# Patient Record
Sex: Male | Born: 1975 | Race: White | Hispanic: No | Smoking: Current every day smoker
Health system: Southern US, Community
[De-identification: ages and names within clinical notes are randomized; demographics above are authoritative.]

## PROBLEM LIST (undated history)

## (undated) DIAGNOSIS — J45909 Unspecified asthma, uncomplicated: Secondary | ICD-10-CM

## (undated) DIAGNOSIS — G8929 Other chronic pain: Secondary | ICD-10-CM

## (undated) DIAGNOSIS — J189 Pneumonia, unspecified organism: Secondary | ICD-10-CM

## (undated) DIAGNOSIS — D649 Anemia, unspecified: Secondary | ICD-10-CM

## (undated) DIAGNOSIS — F209 Schizophrenia, unspecified: Secondary | ICD-10-CM

## (undated) DIAGNOSIS — G894 Chronic pain syndrome: Secondary | ICD-10-CM

## (undated) DIAGNOSIS — M549 Dorsalgia, unspecified: Secondary | ICD-10-CM

## (undated) DIAGNOSIS — F319 Bipolar disorder, unspecified: Secondary | ICD-10-CM

## (undated) DIAGNOSIS — N186 End stage renal disease: Secondary | ICD-10-CM

## (undated) DIAGNOSIS — I255 Ischemic cardiomyopathy: Secondary | ICD-10-CM

## (undated) DIAGNOSIS — I10 Essential (primary) hypertension: Secondary | ICD-10-CM

## (undated) DIAGNOSIS — Z720 Tobacco use: Secondary | ICD-10-CM

## (undated) DIAGNOSIS — R7301 Impaired fasting glucose: Secondary | ICD-10-CM

## (undated) DIAGNOSIS — J449 Chronic obstructive pulmonary disease, unspecified: Secondary | ICD-10-CM

## (undated) DIAGNOSIS — R109 Unspecified abdominal pain: Secondary | ICD-10-CM

## (undated) DIAGNOSIS — G43909 Migraine, unspecified, not intractable, without status migrainosus: Secondary | ICD-10-CM

## (undated) DIAGNOSIS — M542 Cervicalgia: Secondary | ICD-10-CM

## (undated) HISTORY — DX: Schizophrenia, unspecified: F20.9

## (undated) HISTORY — DX: Bipolar disorder, unspecified: F31.9

## (undated) HISTORY — DX: Impaired fasting glucose: R73.01

## (undated) HISTORY — DX: Chronic obstructive pulmonary disease, unspecified: J44.9

## (undated) HISTORY — DX: Anemia, unspecified: D64.9

## (undated) HISTORY — DX: Chronic pain syndrome: G89.4

## (undated) HISTORY — PX: CORONARY ANGIOPLASTY WITH STENT PLACEMENT: SHX49

## (undated) HISTORY — DX: Tobacco use: Z72.0

## (undated) HISTORY — DX: Ischemic cardiomyopathy: I25.5

## (undated) HISTORY — PX: AV FISTULA PLACEMENT: SHX1204

---

## 2002-01-18 ENCOUNTER — Encounter: Payer: Self-pay | Admitting: Emergency Medicine

## 2002-01-18 ENCOUNTER — Emergency Department (HOSPITAL_COMMUNITY): Admission: EM | Admit: 2002-01-18 | Discharge: 2002-01-18 | Payer: Self-pay | Admitting: Emergency Medicine

## 2002-04-27 ENCOUNTER — Emergency Department (HOSPITAL_COMMUNITY): Admission: EM | Admit: 2002-04-27 | Discharge: 2002-04-27 | Payer: Self-pay | Admitting: *Deleted

## 2002-11-10 ENCOUNTER — Emergency Department (HOSPITAL_COMMUNITY): Admission: EM | Admit: 2002-11-10 | Discharge: 2002-11-10 | Payer: Self-pay | Admitting: *Deleted

## 2003-01-27 ENCOUNTER — Emergency Department (HOSPITAL_COMMUNITY): Admission: EM | Admit: 2003-01-27 | Discharge: 2003-01-27 | Payer: Self-pay | Admitting: Emergency Medicine

## 2003-03-21 ENCOUNTER — Encounter: Payer: Self-pay | Admitting: *Deleted

## 2003-03-21 ENCOUNTER — Emergency Department (HOSPITAL_COMMUNITY): Admission: EM | Admit: 2003-03-21 | Discharge: 2003-03-21 | Payer: Self-pay | Admitting: *Deleted

## 2003-04-04 ENCOUNTER — Encounter: Payer: Self-pay | Admitting: *Deleted

## 2003-04-04 ENCOUNTER — Emergency Department (HOSPITAL_COMMUNITY): Admission: EM | Admit: 2003-04-04 | Discharge: 2003-04-04 | Payer: Self-pay | Admitting: *Deleted

## 2003-05-06 ENCOUNTER — Encounter: Payer: Self-pay | Admitting: Emergency Medicine

## 2003-05-06 ENCOUNTER — Emergency Department (HOSPITAL_COMMUNITY): Admission: EM | Admit: 2003-05-06 | Discharge: 2003-05-06 | Payer: Self-pay | Admitting: Emergency Medicine

## 2003-05-09 ENCOUNTER — Inpatient Hospital Stay (HOSPITAL_COMMUNITY): Admission: EM | Admit: 2003-05-09 | Discharge: 2003-05-16 | Payer: Self-pay | Admitting: Psychiatry

## 2003-05-14 ENCOUNTER — Emergency Department (HOSPITAL_COMMUNITY): Admission: EM | Admit: 2003-05-14 | Discharge: 2003-05-14 | Payer: Self-pay | Admitting: Emergency Medicine

## 2003-05-28 ENCOUNTER — Encounter: Payer: Self-pay | Admitting: Emergency Medicine

## 2003-05-29 ENCOUNTER — Inpatient Hospital Stay (HOSPITAL_COMMUNITY): Admission: EM | Admit: 2003-05-29 | Discharge: 2003-05-30 | Payer: Self-pay | Admitting: Emergency Medicine

## 2003-07-13 ENCOUNTER — Emergency Department (HOSPITAL_COMMUNITY): Admission: EM | Admit: 2003-07-13 | Discharge: 2003-07-13 | Payer: Self-pay | Admitting: Emergency Medicine

## 2003-08-02 ENCOUNTER — Encounter: Payer: Self-pay | Admitting: Family Medicine

## 2003-08-02 ENCOUNTER — Ambulatory Visit (HOSPITAL_COMMUNITY): Admission: RE | Admit: 2003-08-02 | Discharge: 2003-08-02 | Payer: Self-pay | Admitting: Family Medicine

## 2003-12-12 ENCOUNTER — Emergency Department (HOSPITAL_COMMUNITY): Admission: EM | Admit: 2003-12-12 | Discharge: 2003-12-12 | Payer: Self-pay | Admitting: Emergency Medicine

## 2004-01-16 ENCOUNTER — Inpatient Hospital Stay (HOSPITAL_COMMUNITY): Admission: EM | Admit: 2004-01-16 | Discharge: 2004-01-23 | Payer: Self-pay | Admitting: Psychiatry

## 2004-03-11 ENCOUNTER — Ambulatory Visit (HOSPITAL_COMMUNITY): Admission: RE | Admit: 2004-03-11 | Discharge: 2004-03-11 | Payer: Self-pay | Admitting: Nephrology

## 2004-03-29 ENCOUNTER — Ambulatory Visit (HOSPITAL_COMMUNITY): Admission: RE | Admit: 2004-03-29 | Discharge: 2004-03-29 | Payer: Self-pay | Admitting: Family Medicine

## 2004-10-27 ENCOUNTER — Emergency Department (HOSPITAL_COMMUNITY): Admission: EM | Admit: 2004-10-27 | Discharge: 2004-10-27 | Payer: Self-pay | Admitting: Emergency Medicine

## 2004-11-24 ENCOUNTER — Ambulatory Visit (HOSPITAL_COMMUNITY): Admission: RE | Admit: 2004-11-24 | Discharge: 2004-11-24 | Payer: Self-pay | Admitting: Family Medicine

## 2005-03-29 ENCOUNTER — Emergency Department (HOSPITAL_COMMUNITY): Admission: EM | Admit: 2005-03-29 | Discharge: 2005-03-29 | Payer: Self-pay | Admitting: Emergency Medicine

## 2005-04-03 ENCOUNTER — Emergency Department (HOSPITAL_COMMUNITY): Admission: EM | Admit: 2005-04-03 | Discharge: 2005-04-03 | Payer: Self-pay | Admitting: *Deleted

## 2005-04-14 ENCOUNTER — Ambulatory Visit: Payer: Self-pay | Admitting: Family Medicine

## 2005-07-24 ENCOUNTER — Emergency Department (HOSPITAL_COMMUNITY): Admission: EM | Admit: 2005-07-24 | Discharge: 2005-07-24 | Payer: Self-pay | Admitting: Emergency Medicine

## 2005-09-24 IMAGING — US US RETROPERITONEAL COMPLETE
1 series · 14 of 25 positions shown · non-contrast
Comparison: none

CLINICAL DATA: Renal insufficiency.
 BILATERAL RENAL ULTRASOUND
 No prior studies.
 The right kidney measures 13.9 cm in greatest length and the left kidney measures 11.7 cm in greatest length.  This discrepancy in size is due to a known right sided duplicated collecting system.  The kidneys are essentially isoechoic when compared to the adjacent spleen and liver.  No renal stones, masses, or hydronephrosis. 
 The urinary bladder is collapsed.  
 IMPRESSION
 Cause of the patient?s renal insufficiency is not immediately apparent.  The kidneys are of the same echogenicity as the adjacent liver and spleen, which can occasionally be seen in patient?s chronic medical renal disease, but is more frequently seen in normal patients.  The right kidney is discrepantly larger than left, but this is due to a nonduplicated collecting system.  Even partially duplicated collecting system can predispose the patient to infection on the right.

[Series 1: unknown · 0.28mm/px · 14 of 34 slices shown]
[im 1/34]
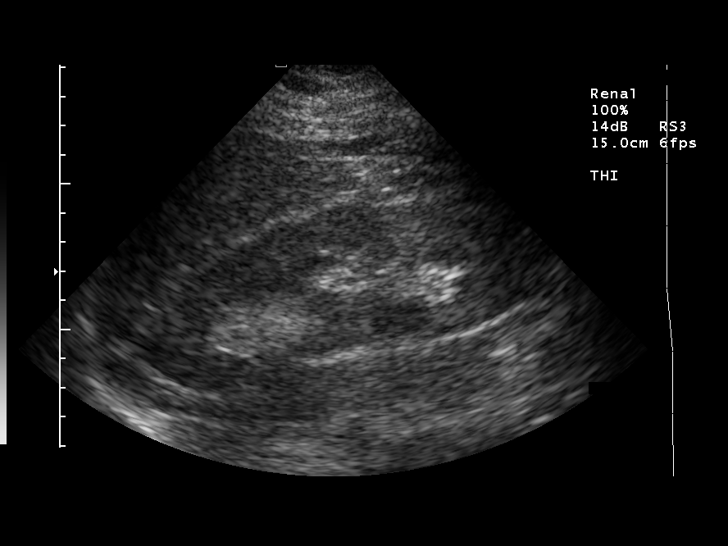
[im 3/34]
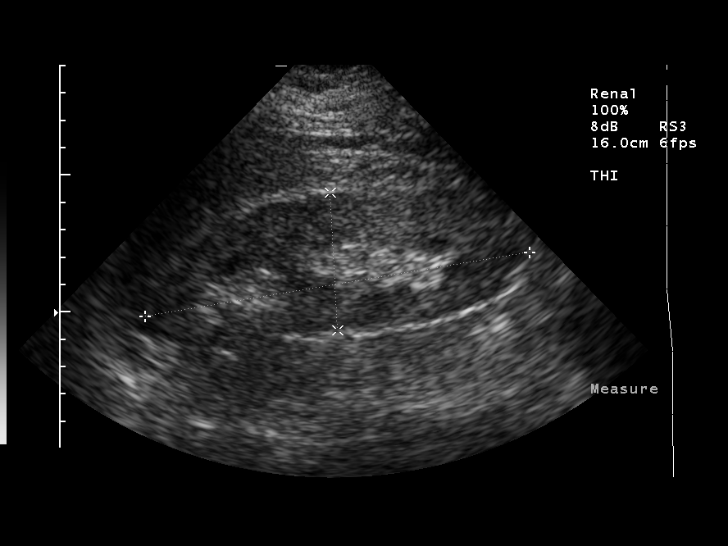
[im 6/34]
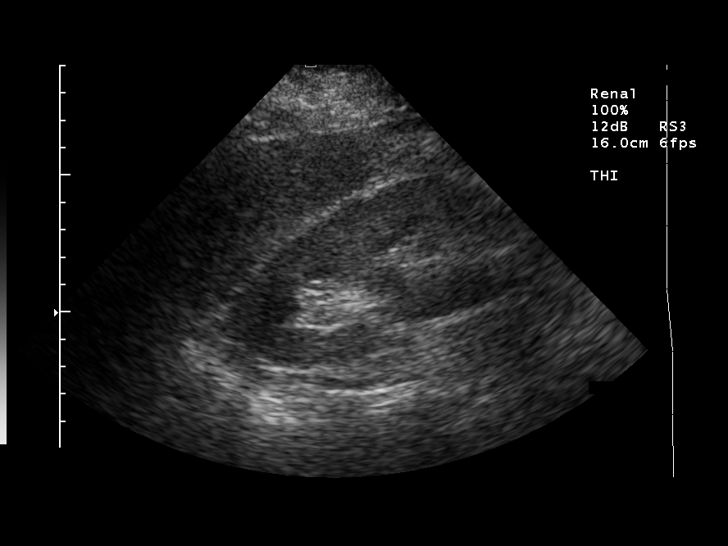
[im 9/34]
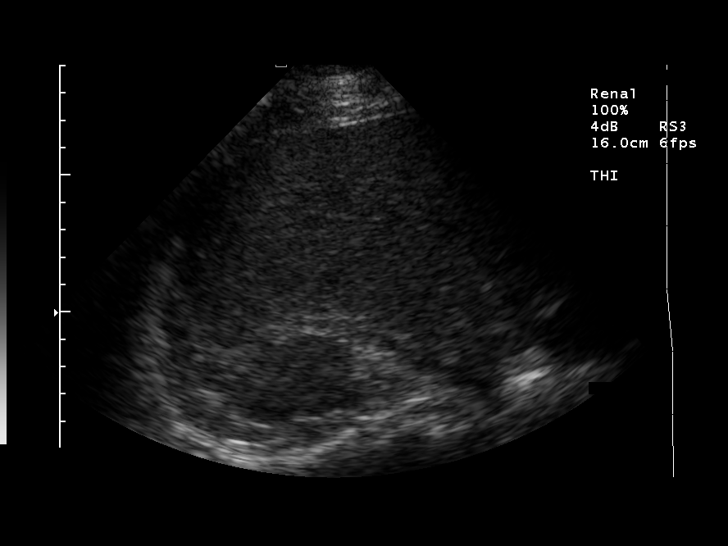
[im 12/34]
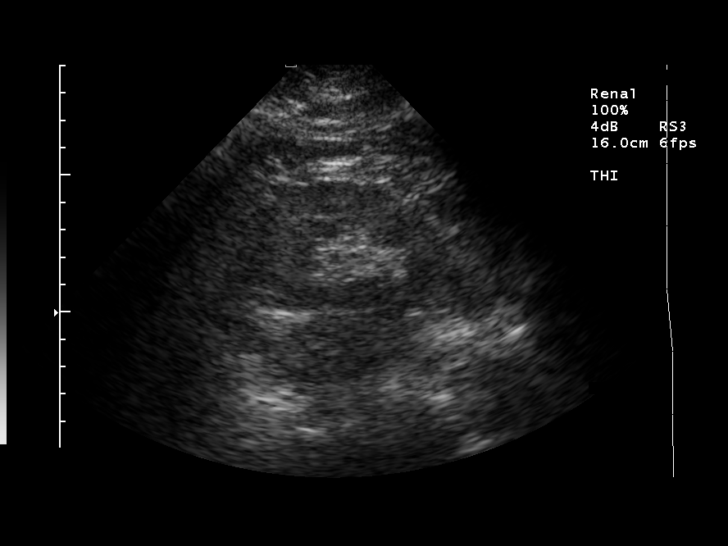
[im 13/34]
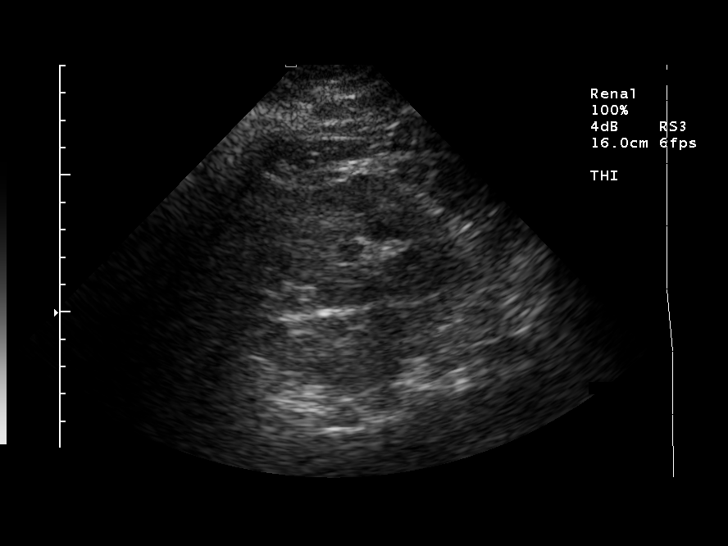
[im 16/34]
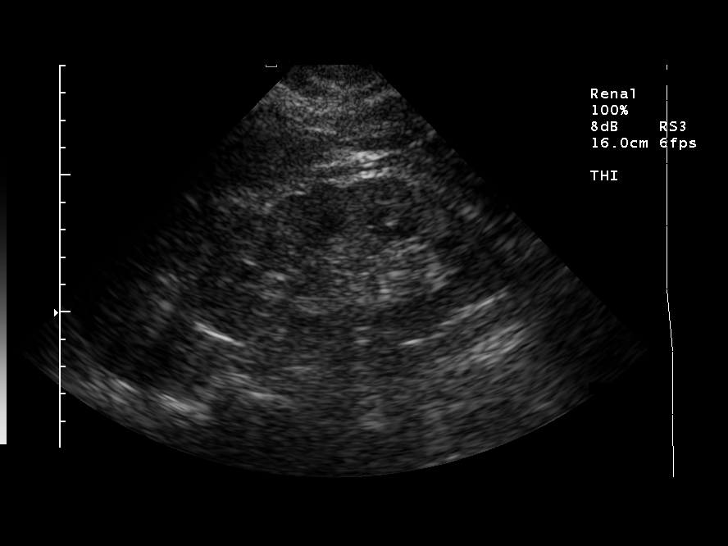
[im 18/34]
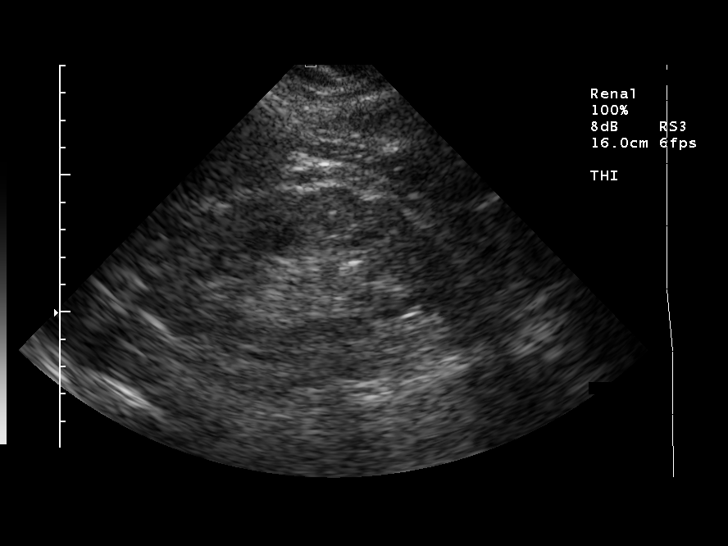
[im 21/34]
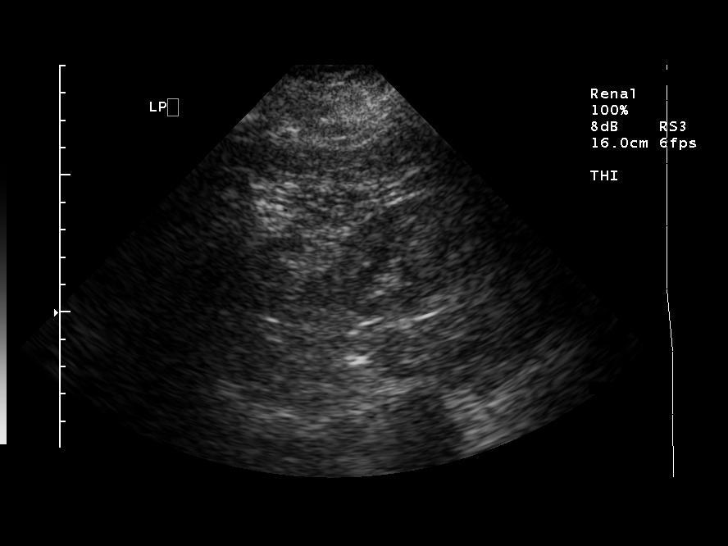
[im 23/34]
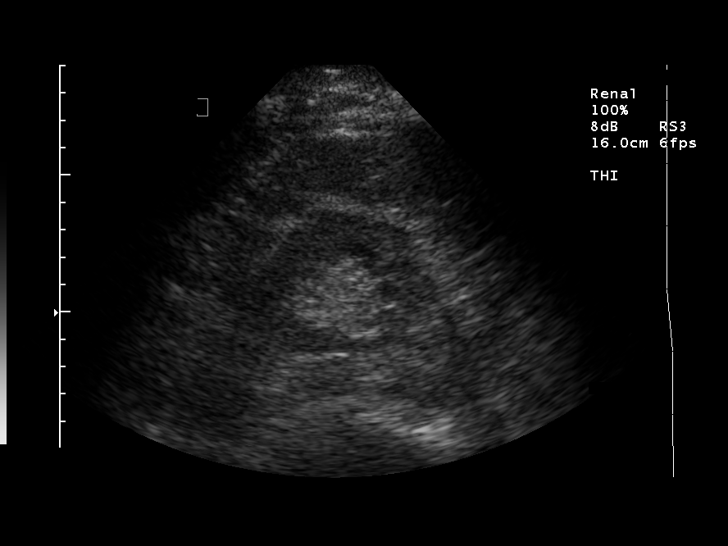
[im 25/34]
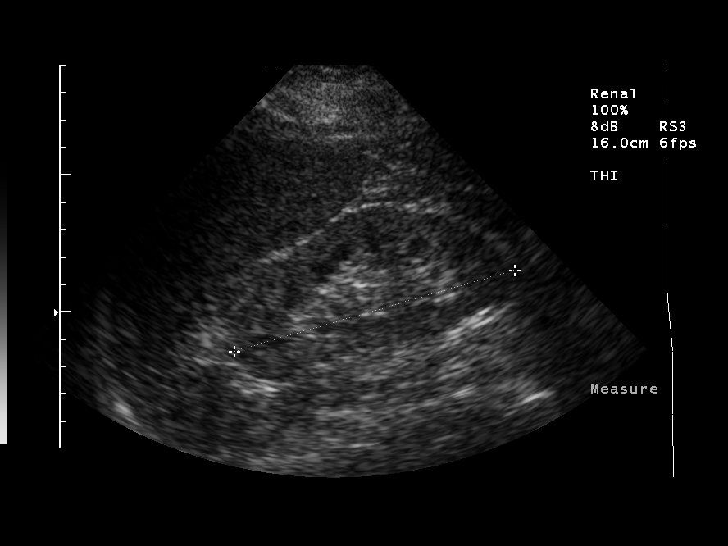
[im 28/34]
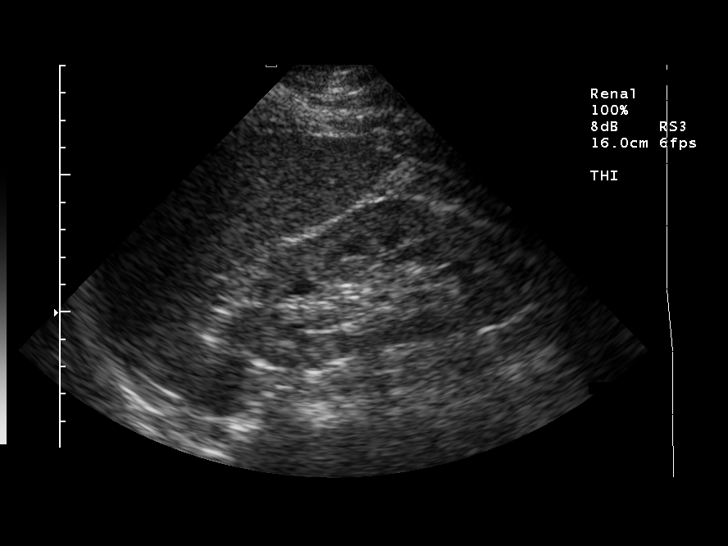
[im 31/34]
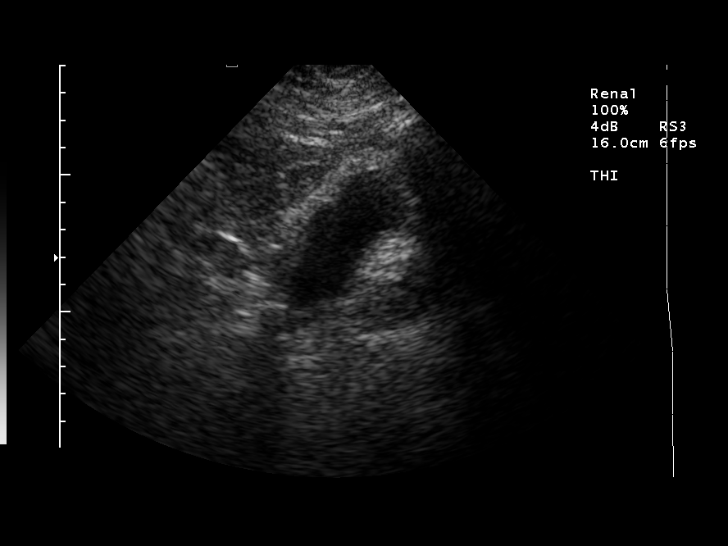
[im 34/34]
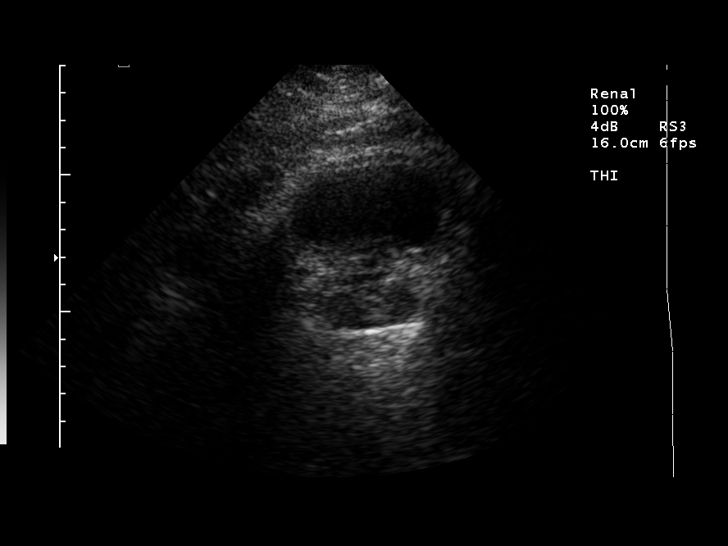

[14 of 25 positions shown; findings below may reference images not displayed]

## 2005-10-12 IMAGING — NM NM BONE WHOLE BODY
2 series · 2 of 2 positions shown · non-contrast
Comparison: none

CLINICAL DATA: Diffuse pain.    
 NM WHOLE BODY BONE SCAN
 Following the IV injection of 25 mCi Pc-00m MDP, a whole body study was performed.  There is increased uptake seen associated with the knees bilaterally within the medial compartment regions.  There is also diffusely increased uptake seen associated with the ankles bilaterally and the tarsal portions of the feet.  There is also mild increased uptake seen associated with the right shoulder in the area of the glenohumeral joint.  There are no other areas of increased or decreased uptake and the renal activity is symmetrical.
 IMPRESSION
 Changes are consistent with arthritic uptake associated with the knees, ankles, feet and right shoulder.  I recommend correlation with plain films of these areas if these have not been previously performed.

[Series 1: total body · 5.57mm/px · 1 of 1 slices shown (1 of 2)]
[im 1/1]
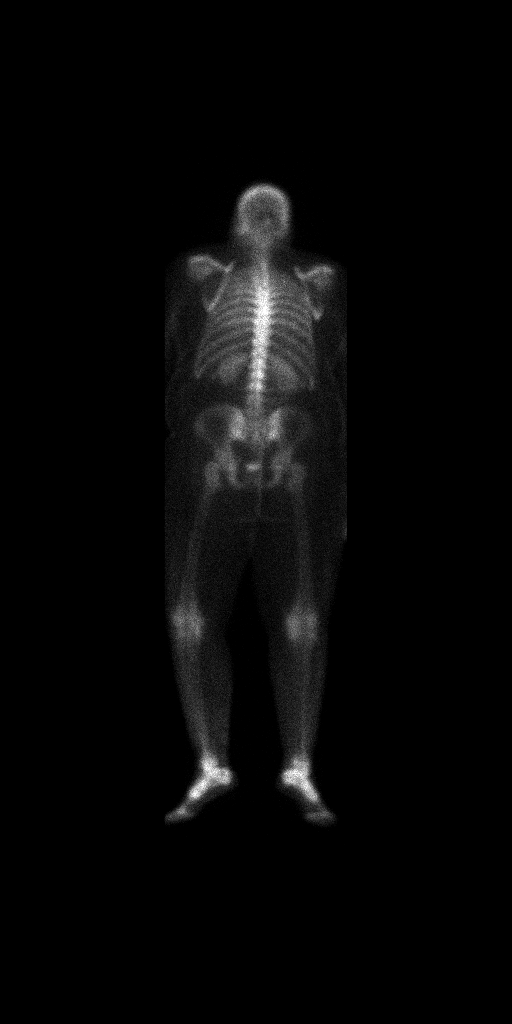

[Series 1: total body · 5.57mm/px · 1 of 1 slices shown (2 of 2)]
[im 1/1]
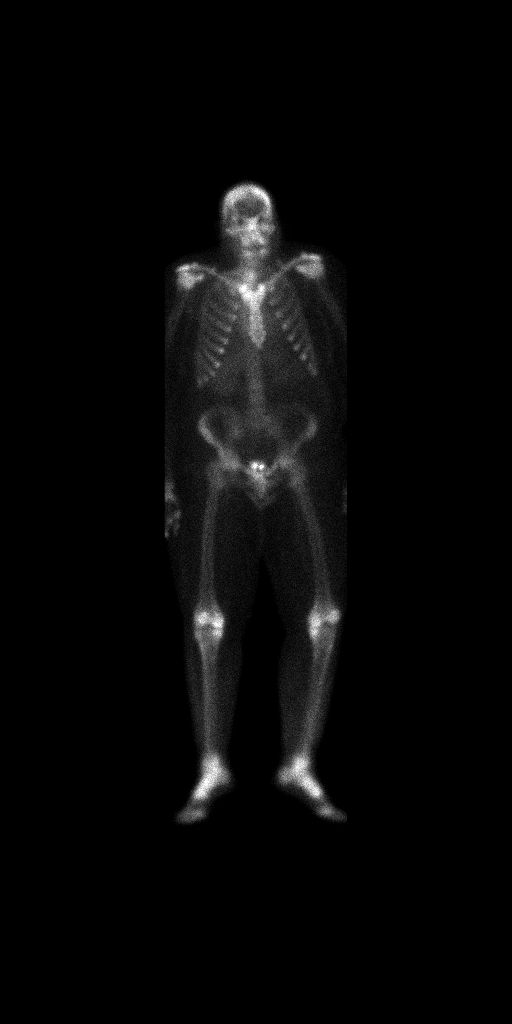

[2 of 2 positions shown; findings below may reference images not displayed]

## 2006-05-12 IMAGING — CR DG CHEST 2V
2 series · 2 of 2 positions shown · non-contrast
Comparison: none

CLINICAL DATA: Short of breath

Chest 2 view:
Comparison 01/18/2002. Mild enlargement of the cardiac silhouette is stable.
Patchy opacities in both lower lobes may represent some early infiltrates versus
patchy subsegmental atelectasis. No effusion.

[view not recorded (1 of 2)]
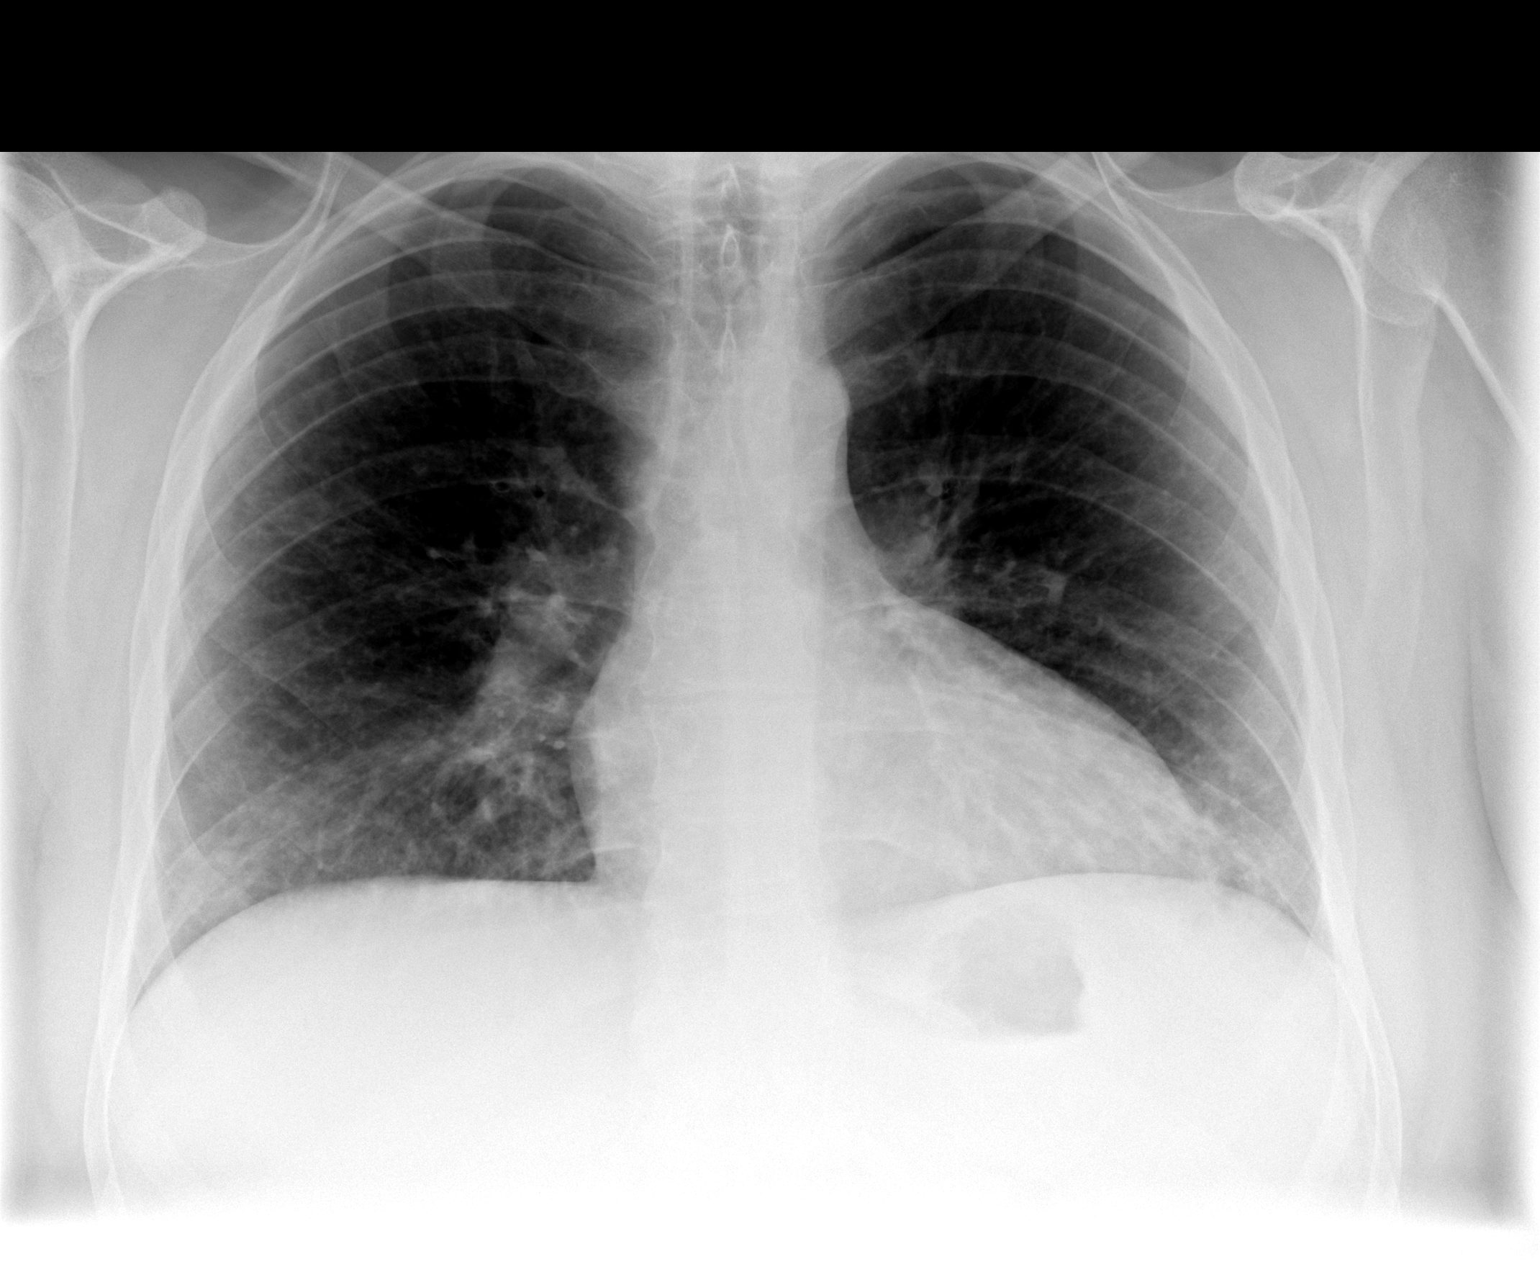

[view not recorded (2 of 2)]
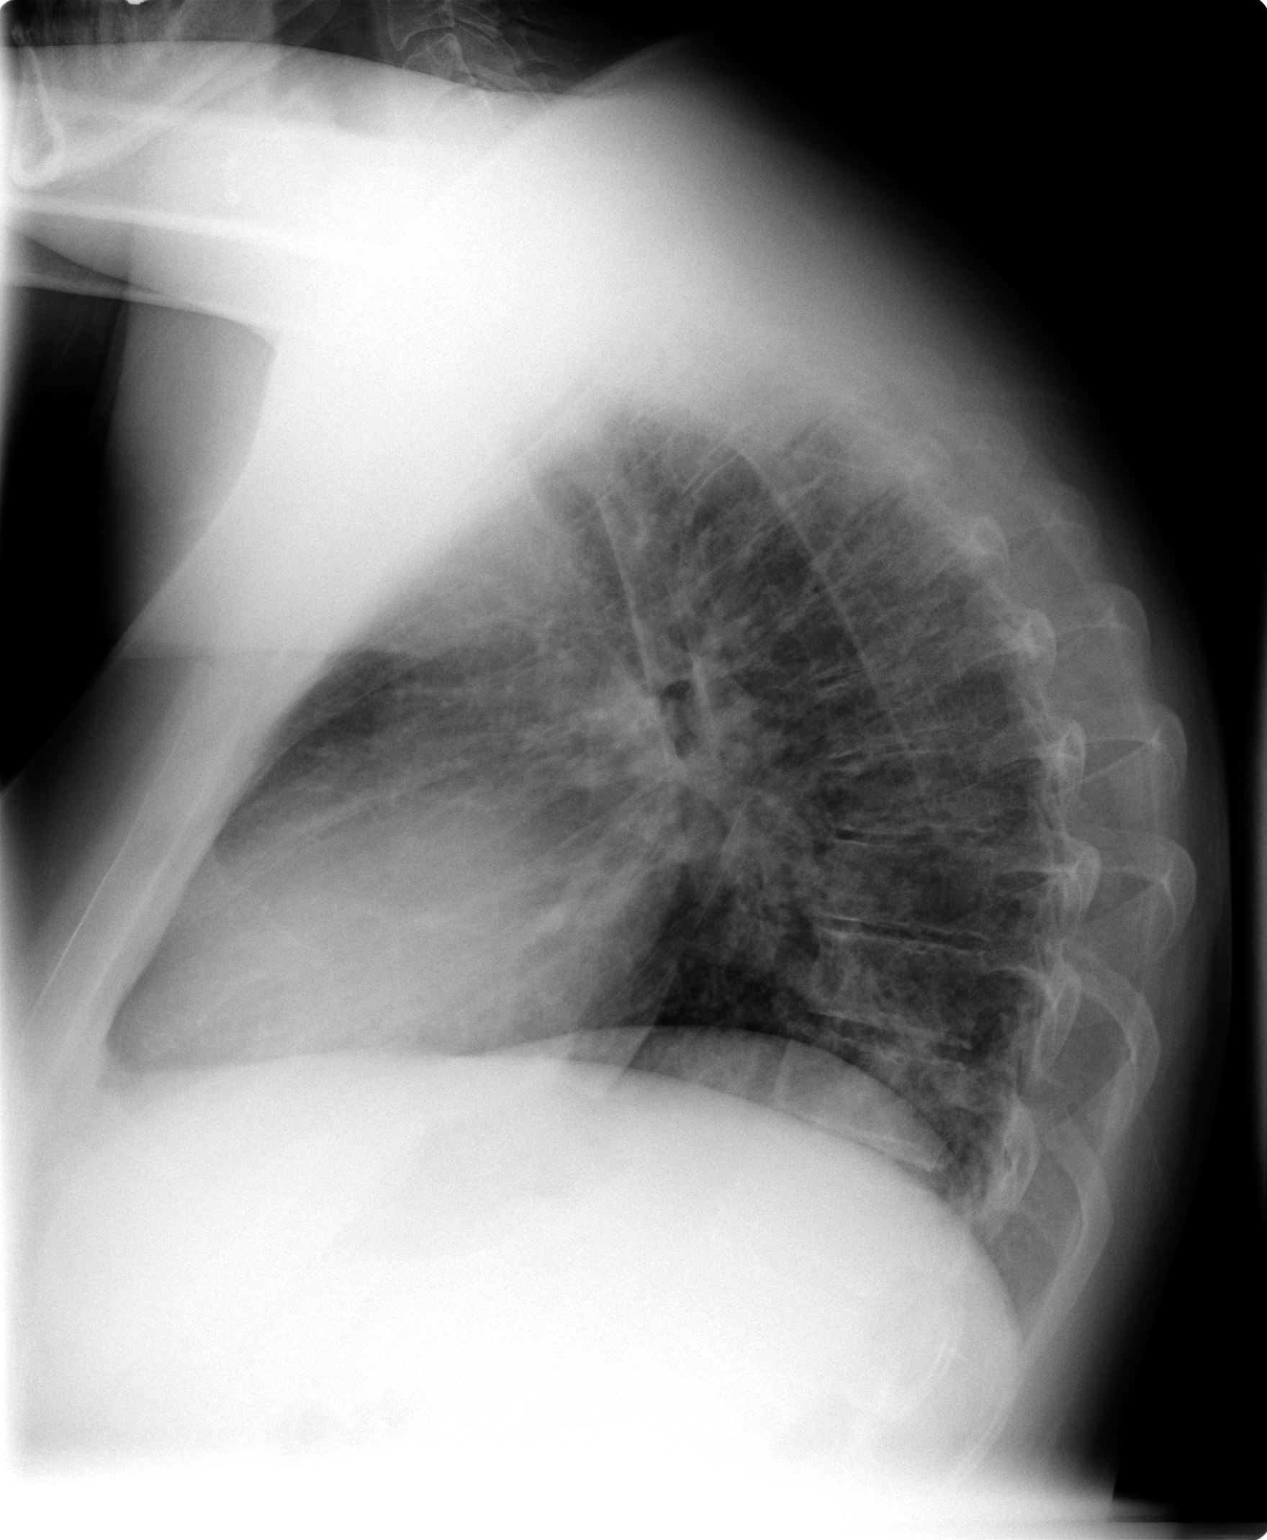

[2 of 2 positions shown; findings below may reference images not displayed]

IMPRESSION: 1. Patchy bilateral lower lobe infiltrates  or atelectasis

## 2006-06-09 IMAGING — NM NM BONE WHOLE BODY
2 series · 2 of 2 positions shown · non-contrast
Comparison: Whole-body bone scan 03/29/2004.

CLINICAL DATA: Back pain. Elevated alkaline phosphatase.

NM WHOLE BODY BONE SCAN 11/24/2004:
TECHNIQUE: Whole body anterior and posterior images were obtained approximately
3 hours after intravenous injection of radiopharmaceutical.
Radiopharmaceutical:  25 mCi Uc-TTm MDP

[Series 1: total body · 5.57mm/px · 1 of 1 slices shown (1 of 2)]
[im 1/1]
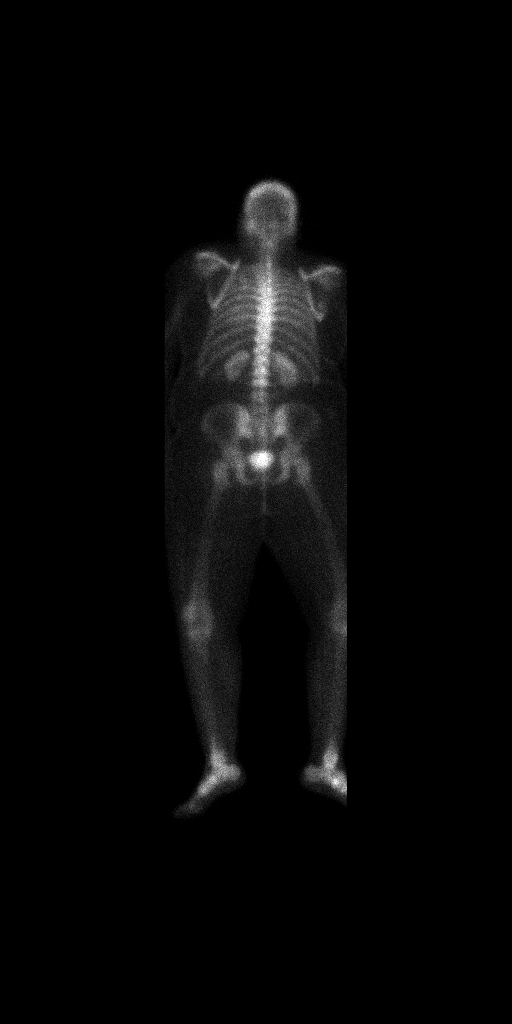

[Series 1: total body · 5.57mm/px · 1 of 1 slices shown (2 of 2)]
[im 1/1]
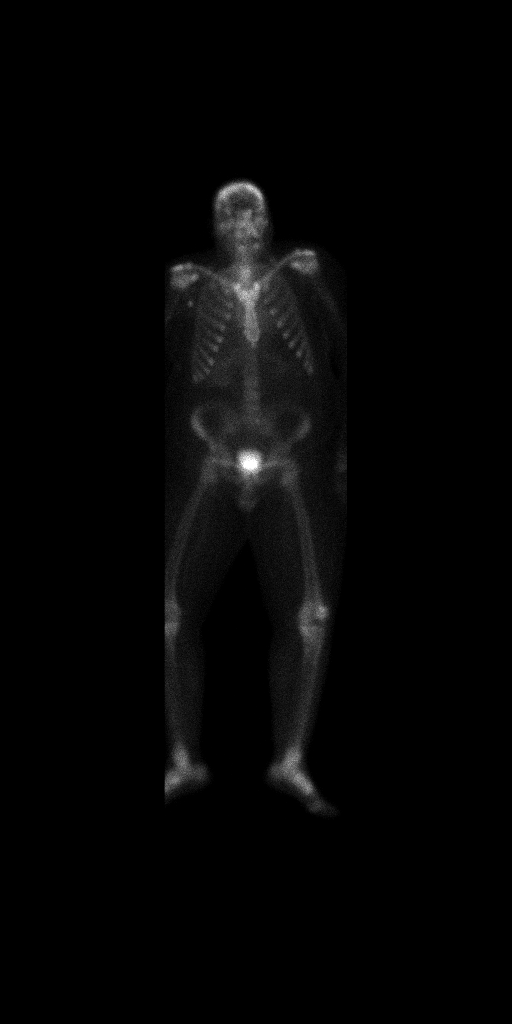

[2 of 2 positions shown; findings below may reference images not displayed]

FINDINGS: Again demonstrated and unchanged from the previous examination is
degenerative uptake in the shoulders (right greater than left), knees, ankles,
and feet. Since the previous examination, the patient has developed a tiny focus
of increased uptake which appears to be in the soft tissues of the right axilla,
probably within an axillary node. No new osseous uptake is identified.
Specifically, no abnormal uptake is identified in the spine to explain back
pain. Submucosal uptake in the vicinity of the nasopharynx and paranasal sinuses
is again noted and is unchanged.
IMPRESSION: 1. Degenerative uptake in the shoulders, knees, ankles, and feet, unchanged
since March 2004. No new focal osseous uptake. Specifically, no uptake in the spine to explain
back pain.

3. New uptake within a right axillary lymph node. This is unusual and is likely
related to some extravasation at the injection site in the right arm with
subsequent lymphatic uptake. (I have confirmed that the injection was indeed in
the right antecubital vein.)

## 2006-12-24 ENCOUNTER — Emergency Department (HOSPITAL_COMMUNITY): Admission: EM | Admit: 2006-12-24 | Discharge: 2006-12-24 | Payer: Self-pay | Admitting: Emergency Medicine

## 2007-08-11 ENCOUNTER — Emergency Department (HOSPITAL_COMMUNITY): Admission: EM | Admit: 2007-08-11 | Discharge: 2007-08-11 | Payer: Self-pay | Admitting: Emergency Medicine

## 2007-10-10 ENCOUNTER — Inpatient Hospital Stay (HOSPITAL_COMMUNITY): Admission: EM | Admit: 2007-10-10 | Discharge: 2007-10-11 | Payer: Self-pay | Admitting: Emergency Medicine

## 2008-06-17 ENCOUNTER — Ambulatory Visit: Payer: Self-pay | Admitting: Cardiology

## 2008-10-13 ENCOUNTER — Inpatient Hospital Stay (HOSPITAL_COMMUNITY): Admission: EM | Admit: 2008-10-13 | Discharge: 2008-10-15 | Payer: Self-pay | Admitting: Emergency Medicine

## 2008-10-14 ENCOUNTER — Ambulatory Visit: Payer: Self-pay | Admitting: Internal Medicine

## 2008-10-15 ENCOUNTER — Ambulatory Visit: Payer: Self-pay | Admitting: Internal Medicine

## 2009-01-05 ENCOUNTER — Inpatient Hospital Stay (HOSPITAL_COMMUNITY): Admission: EM | Admit: 2009-01-05 | Discharge: 2009-01-08 | Payer: Self-pay | Admitting: Emergency Medicine

## 2009-01-05 ENCOUNTER — Ambulatory Visit: Payer: Self-pay | Admitting: Cardiology

## 2009-01-06 ENCOUNTER — Encounter: Payer: Self-pay | Admitting: *Deleted

## 2009-01-06 LAB — CONVERTED CEMR LAB: Triglycerides: 49 mg/dL

## 2009-01-07 ENCOUNTER — Encounter: Payer: Self-pay | Admitting: Cardiology

## 2009-01-19 ENCOUNTER — Ambulatory Visit: Payer: Self-pay | Admitting: Cardiovascular Disease

## 2009-01-19 ENCOUNTER — Inpatient Hospital Stay (HOSPITAL_COMMUNITY): Admission: EM | Admit: 2009-01-19 | Discharge: 2009-01-21 | Payer: Self-pay | Admitting: Cardiology

## 2009-01-19 ENCOUNTER — Encounter: Payer: Self-pay | Admitting: Cardiology

## 2009-01-26 ENCOUNTER — Ambulatory Visit: Payer: Self-pay | Admitting: Cardiovascular Disease

## 2009-02-05 ENCOUNTER — Ambulatory Visit: Payer: Self-pay | Admitting: Cardiovascular Disease

## 2009-02-05 ENCOUNTER — Ambulatory Visit: Payer: Self-pay | Admitting: Infectious Disease

## 2009-02-05 ENCOUNTER — Inpatient Hospital Stay (HOSPITAL_COMMUNITY): Admission: EM | Admit: 2009-02-05 | Discharge: 2009-02-12 | Payer: Self-pay | Admitting: Emergency Medicine

## 2009-02-12 ENCOUNTER — Inpatient Hospital Stay (HOSPITAL_COMMUNITY): Admission: AD | Admit: 2009-02-12 | Discharge: 2009-02-16 | Payer: Self-pay | Admitting: Psychiatry

## 2009-02-12 ENCOUNTER — Ambulatory Visit: Payer: Self-pay | Admitting: Psychiatry

## 2009-02-13 ENCOUNTER — Encounter: Payer: Self-pay | Admitting: *Deleted

## 2009-02-14 ENCOUNTER — Other Ambulatory Visit: Payer: Self-pay | Admitting: Emergency Medicine

## 2009-03-07 ENCOUNTER — Emergency Department (HOSPITAL_COMMUNITY): Admission: EM | Admit: 2009-03-07 | Discharge: 2009-03-07 | Payer: Self-pay | Admitting: Emergency Medicine

## 2009-03-16 ENCOUNTER — Ambulatory Visit: Payer: Self-pay | Admitting: Cardiology

## 2009-03-26 ENCOUNTER — Emergency Department (HOSPITAL_COMMUNITY): Admission: EM | Admit: 2009-03-26 | Discharge: 2009-03-27 | Payer: Self-pay | Admitting: Emergency Medicine

## 2009-04-08 ENCOUNTER — Emergency Department (HOSPITAL_COMMUNITY): Admission: EM | Admit: 2009-04-08 | Discharge: 2009-04-08 | Payer: Self-pay | Admitting: Emergency Medicine

## 2009-04-16 ENCOUNTER — Emergency Department (HOSPITAL_COMMUNITY): Admission: EM | Admit: 2009-04-16 | Discharge: 2009-04-16 | Payer: Self-pay | Admitting: Emergency Medicine

## 2009-05-21 DIAGNOSIS — I10 Essential (primary) hypertension: Secondary | ICD-10-CM | POA: Insufficient documentation

## 2009-05-27 ENCOUNTER — Encounter (INDEPENDENT_AMBULATORY_CARE_PROVIDER_SITE_OTHER): Payer: Self-pay | Admitting: *Deleted

## 2009-08-27 ENCOUNTER — Ambulatory Visit: Payer: Self-pay | Admitting: Cardiology

## 2009-08-28 ENCOUNTER — Encounter: Payer: Self-pay | Admitting: Cardiology

## 2009-09-03 ENCOUNTER — Ambulatory Visit: Payer: Self-pay | Admitting: Cardiology

## 2009-09-04 ENCOUNTER — Inpatient Hospital Stay (HOSPITAL_COMMUNITY): Admission: EM | Admit: 2009-09-04 | Discharge: 2009-09-09 | Payer: Self-pay | Admitting: Cardiology

## 2009-09-04 ENCOUNTER — Ambulatory Visit: Payer: Self-pay | Admitting: Cardiovascular Disease

## 2009-09-05 ENCOUNTER — Encounter: Payer: Self-pay | Admitting: Cardiology

## 2009-09-06 ENCOUNTER — Encounter: Payer: Self-pay | Admitting: Cardiology

## 2009-09-07 ENCOUNTER — Encounter: Payer: Self-pay | Admitting: Cardiology

## 2009-09-19 ENCOUNTER — Encounter: Payer: Self-pay | Admitting: Cardiology

## 2009-09-21 ENCOUNTER — Encounter: Payer: Self-pay | Admitting: Cardiology

## 2009-09-22 ENCOUNTER — Inpatient Hospital Stay (HOSPITAL_COMMUNITY): Admission: EM | Admit: 2009-09-22 | Discharge: 2009-09-23 | Payer: Self-pay | Admitting: Emergency Medicine

## 2009-09-23 ENCOUNTER — Encounter: Payer: Self-pay | Admitting: Cardiology

## 2009-09-29 ENCOUNTER — Encounter: Payer: Self-pay | Admitting: Cardiology

## 2009-10-26 ENCOUNTER — Ambulatory Visit: Payer: Self-pay | Admitting: Cardiology

## 2009-10-26 ENCOUNTER — Encounter: Payer: Self-pay | Admitting: Physician Assistant

## 2009-10-26 ENCOUNTER — Encounter: Payer: Self-pay | Admitting: Cardiology

## 2009-10-27 ENCOUNTER — Encounter: Payer: Self-pay | Admitting: Physician Assistant

## 2009-11-09 ENCOUNTER — Encounter: Payer: Self-pay | Admitting: Cardiology

## 2009-11-10 ENCOUNTER — Encounter: Payer: Self-pay | Admitting: Physician Assistant

## 2009-11-26 ENCOUNTER — Encounter: Payer: Self-pay | Admitting: Cardiology

## 2009-12-21 ENCOUNTER — Encounter: Payer: Self-pay | Admitting: Cardiology

## 2010-01-15 ENCOUNTER — Inpatient Hospital Stay (HOSPITAL_COMMUNITY): Admission: EM | Admit: 2010-01-15 | Discharge: 2010-01-17 | Payer: Self-pay | Admitting: Emergency Medicine

## 2010-02-18 ENCOUNTER — Emergency Department (HOSPITAL_COMMUNITY): Admission: EM | Admit: 2010-02-18 | Discharge: 2010-02-18 | Payer: Self-pay | Admitting: Emergency Medicine

## 2010-03-15 ENCOUNTER — Inpatient Hospital Stay (HOSPITAL_COMMUNITY): Admission: EM | Admit: 2010-03-15 | Discharge: 2010-03-17 | Payer: Self-pay | Admitting: Emergency Medicine

## 2010-03-15 ENCOUNTER — Ambulatory Visit: Payer: Self-pay | Admitting: Cardiology

## 2010-03-16 ENCOUNTER — Encounter: Payer: Self-pay | Admitting: Cardiology

## 2010-04-23 ENCOUNTER — Inpatient Hospital Stay (HOSPITAL_COMMUNITY): Admission: EM | Admit: 2010-04-23 | Discharge: 2010-04-25 | Payer: Self-pay | Admitting: Emergency Medicine

## 2010-04-28 IMAGING — CT CT PELVIS W/ CM
1 of 3 series · 14 of 32 positions shown, 19 images · IV contrast (Omnipaque 300)
Comparison: None

CT ABDOMEN

CLINICAL DATA: Medical clearance.  Mid abdominal pain,
constipation.

CT ABDOMEN AND PELVIS WITH CONTRAST
TECHNIQUE: Multidetector CT imaging of the abdomen and pelvis was
performed using the standard protocol following bolus
administration of intravenous contrast.
Contrast: 100 ml Rmnipaque-088

[Series 2: abd_pel 5.0 b40f · axial · 0.86mm/px · z∈[-482,-52]mm · 14 of 98 slices shown, 19 images]
[im 6/98  soft-tissue]
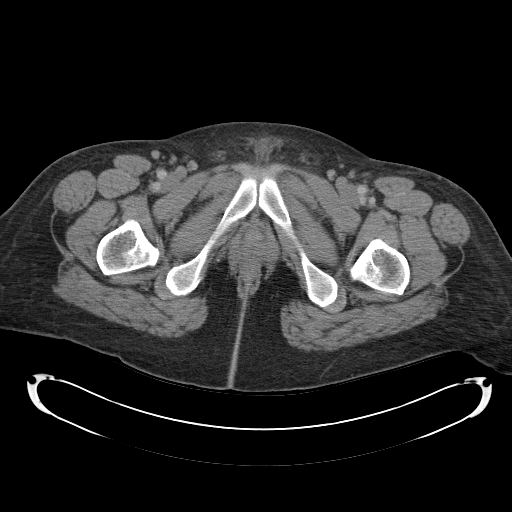
[im 6/98  bone]
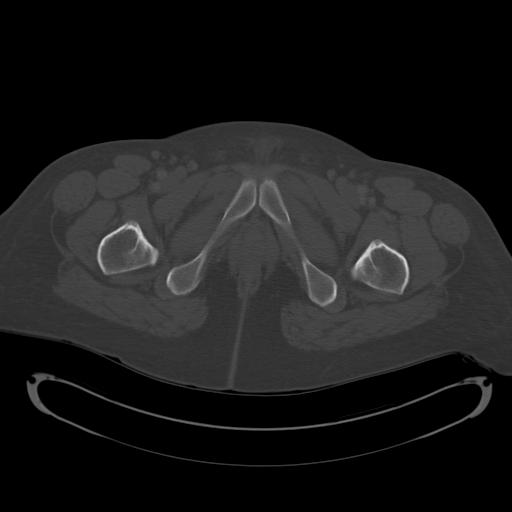
[im 12/98  soft-tissue]
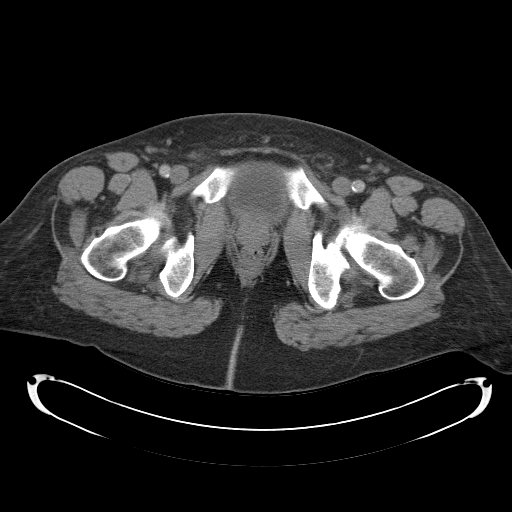
[im 23/98  soft-tissue]
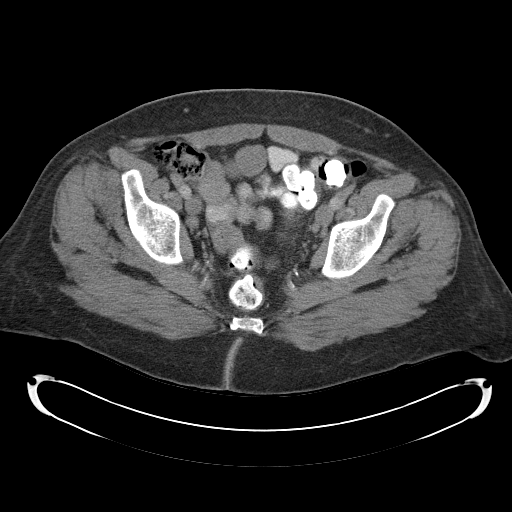
[im 29/98  soft-tissue]
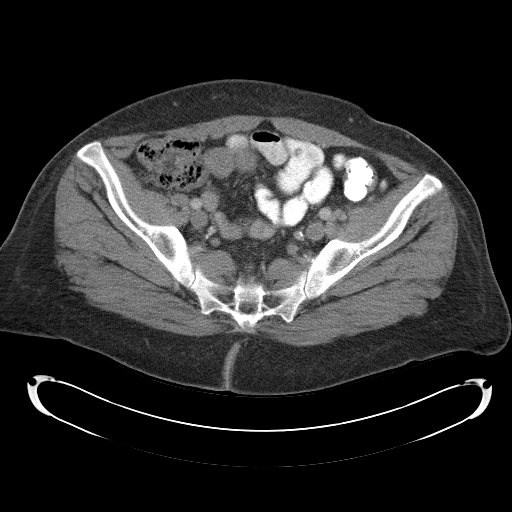
[im 35/98  soft-tissue]
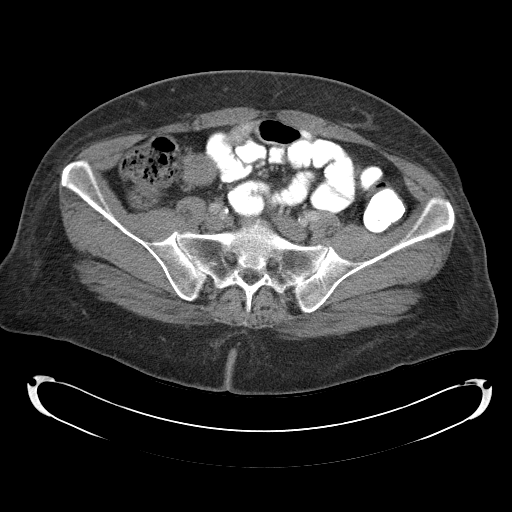
[im 40/98  soft-tissue]
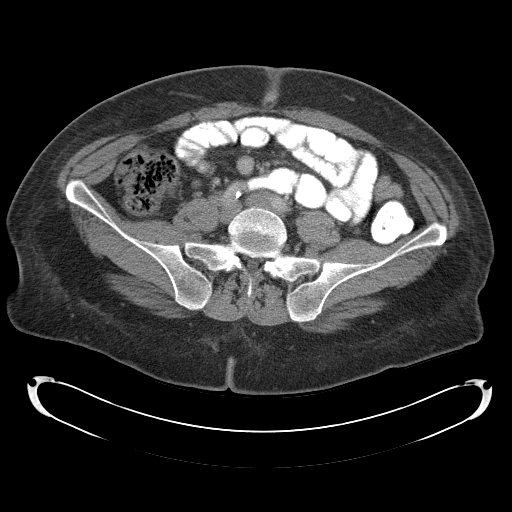
[im 52/98  soft-tissue]
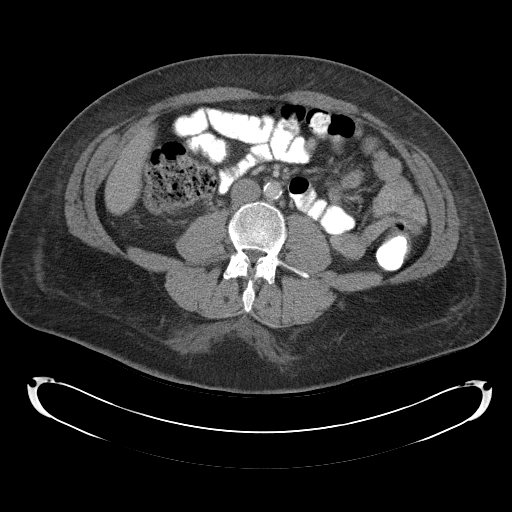
[im 58/98  soft-tissue]
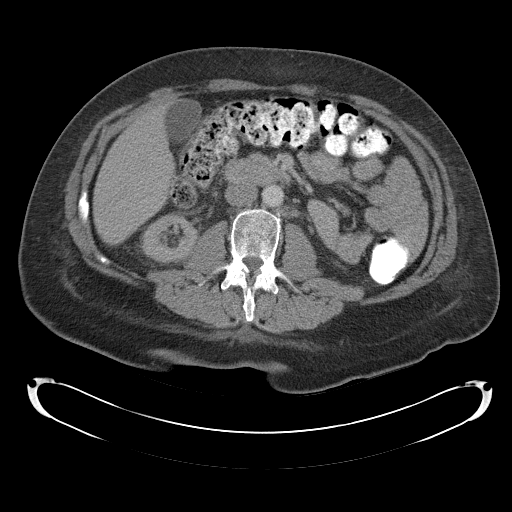
[im 63/98  soft-tissue]
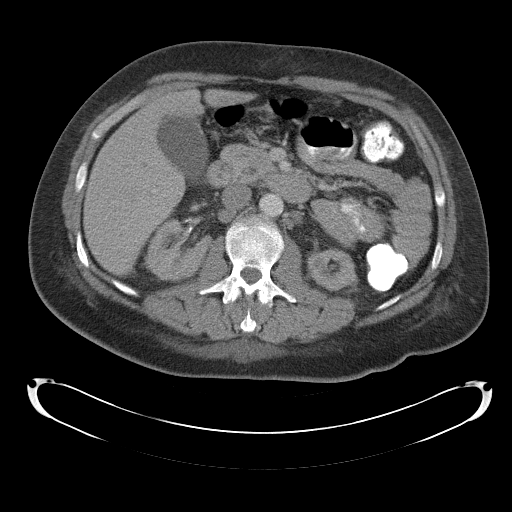
[im 63/98  bone]
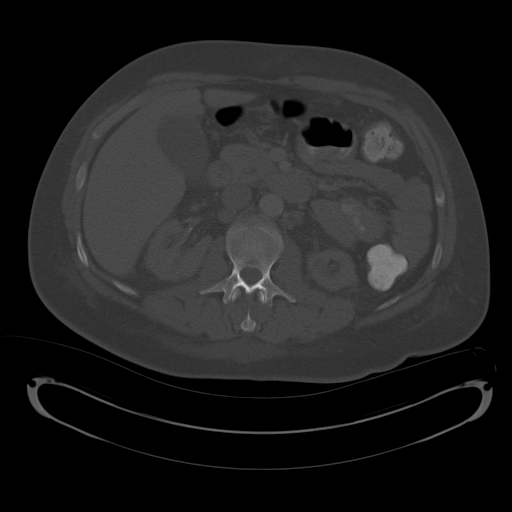
[im 69/98  soft-tissue]
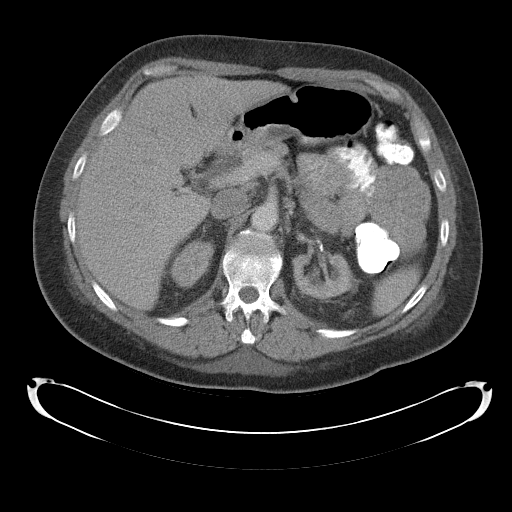
[im 75/98  soft-tissue]
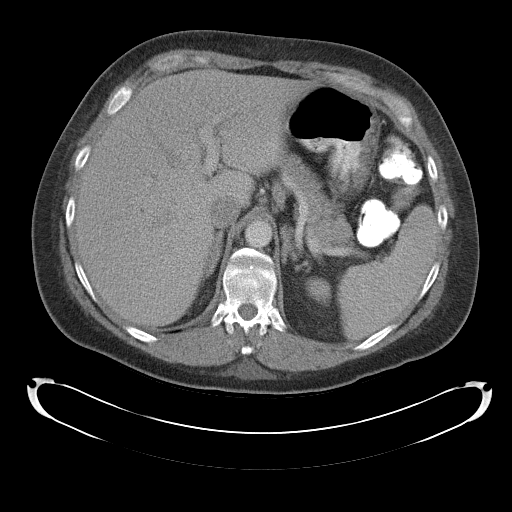
[im 75/98  lung]
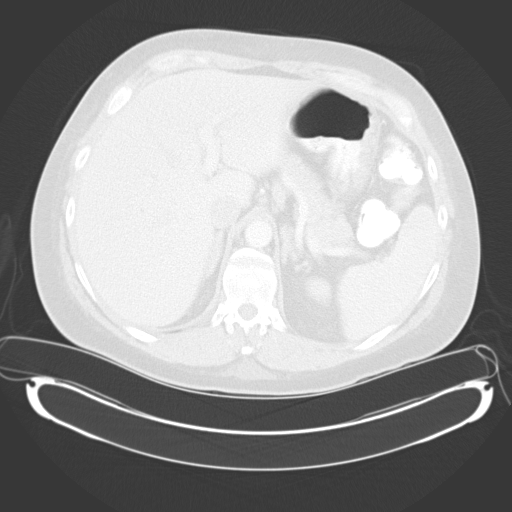
[im 80/98  lung]
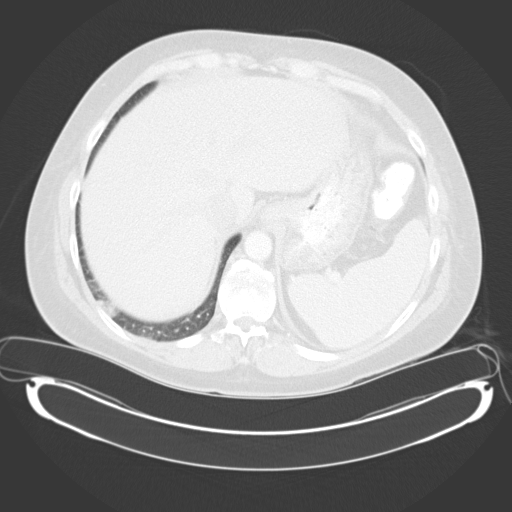
[im 86/98  soft-tissue]
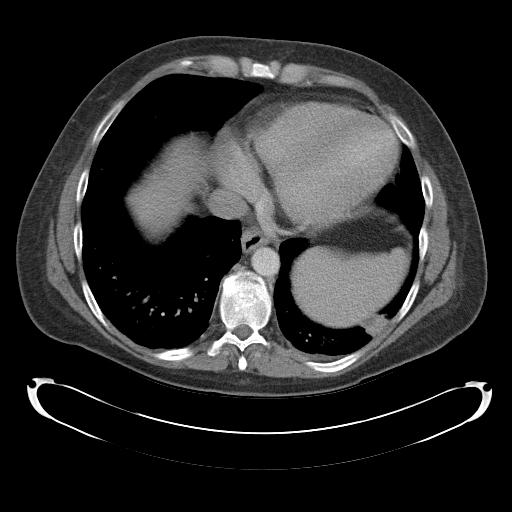
[im 86/98  lung]
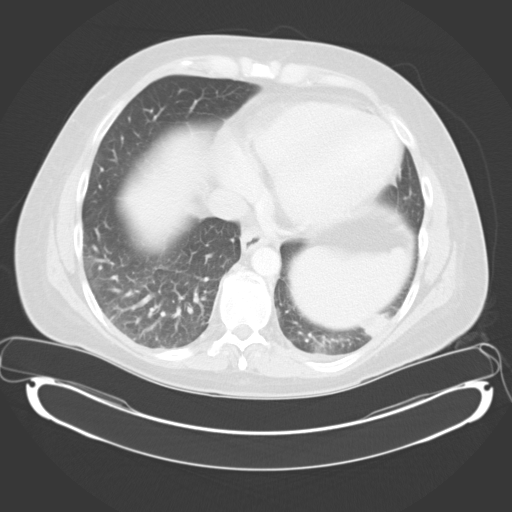
[im 92/98  soft-tissue]
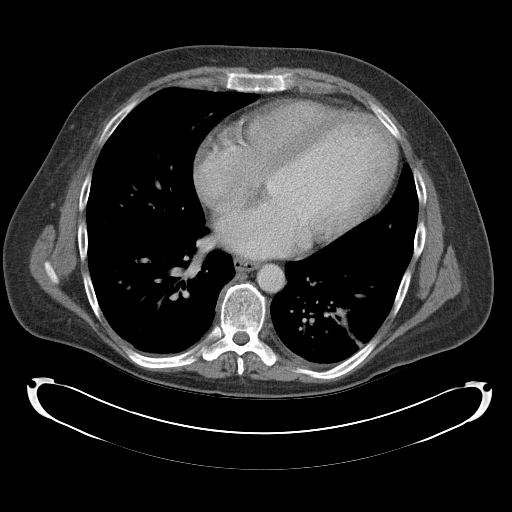
[im 92/98  lung]
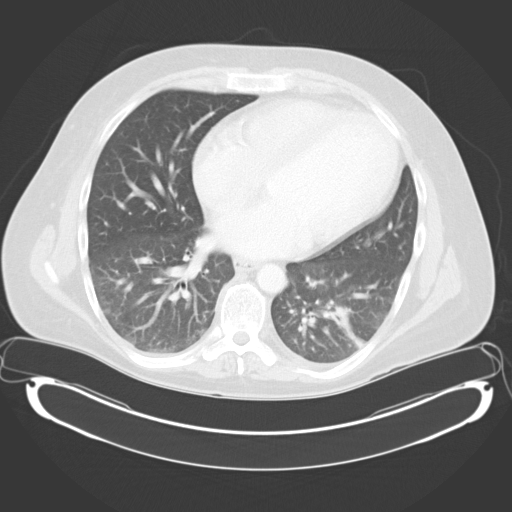

[14 of 32 positions shown; findings below may reference images not displayed]

FINDINGS: Heart is mildly enlarged.  Patchy bibasilar opacities,
which could represent atelectasis or early infiltrates,
particularly in the left base.  No effusions.

Kidneys are atrophic.  The patient reportedly has end-stage renal
disease on dialysis.  Liver, spleen, pancreas, adrenals,
gallbladder unremarkable.  Scattered small retroperitoneal and
mesenteric nodes, none pathologically enlarged.

Bowel grossly unremarkable.  No free fluid, free air, or
adenopathy. Atherosclerotic calcifications throughout the
infrarenal aorta without aneurysm.

No acute bony abnormality.
IMPRESSION: Cardiomegaly.  Patchy bibasilar opacities, left greater than right,
atelectasis versus infiltrates.

Atrophic kidneys in this patient with end-stage renal disease.

No acute findings in the abdomen.

CT PELVIS
FINDINGS: There is a tiny amount of free fluid in the pelvis.
Bowel grossly unremarkable.  No free air or adenopathy.  Urinary
bladder grossly unremarkable.  No acute bony abnormality.
IMPRESSION: Tiny amount of free fluid in the pelvis.

## 2010-04-28 IMAGING — CR DG CHEST 2V
2 series · 2 of 2 positions shown · non-contrast
Comparison: 10/27/2004

CLINICAL DATA: Detox, dialysis, hypertension, asthma, smoker,
medical clearance

CHEST - 2 VIEW

[view not recorded (1 of 2)]
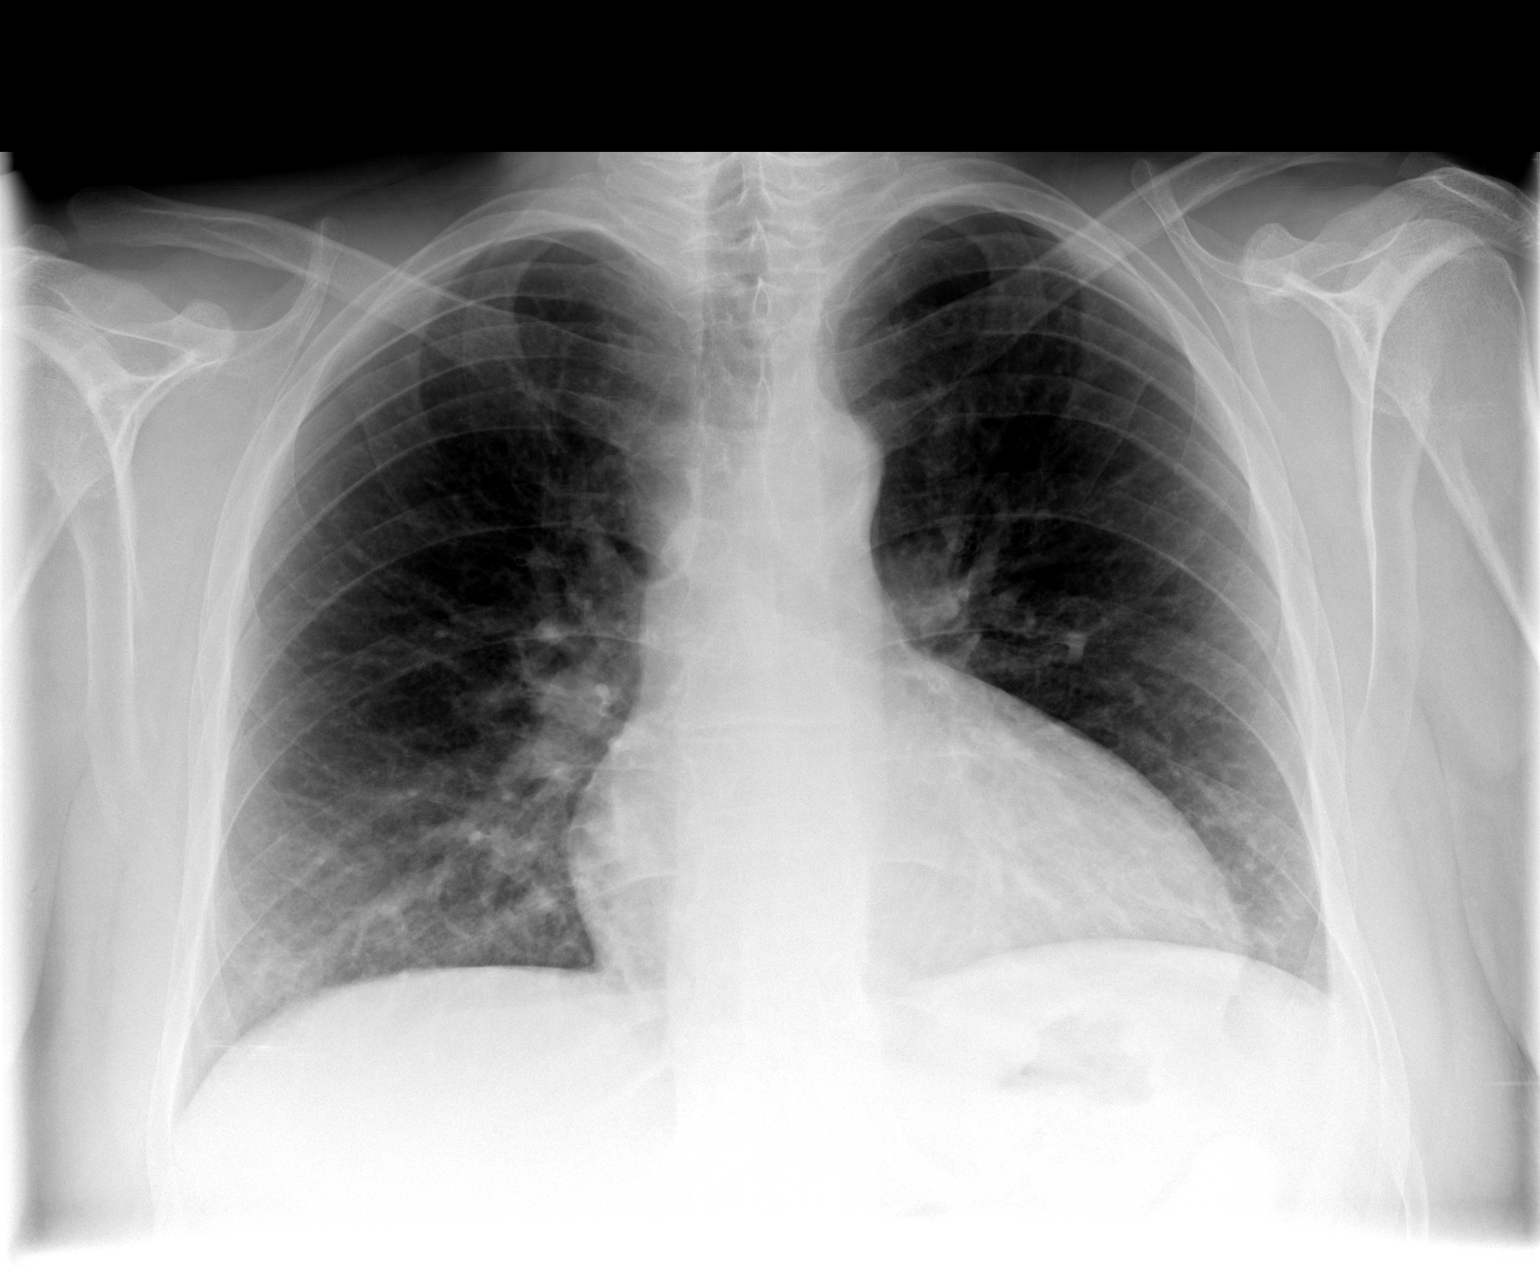

[view not recorded (2 of 2)]
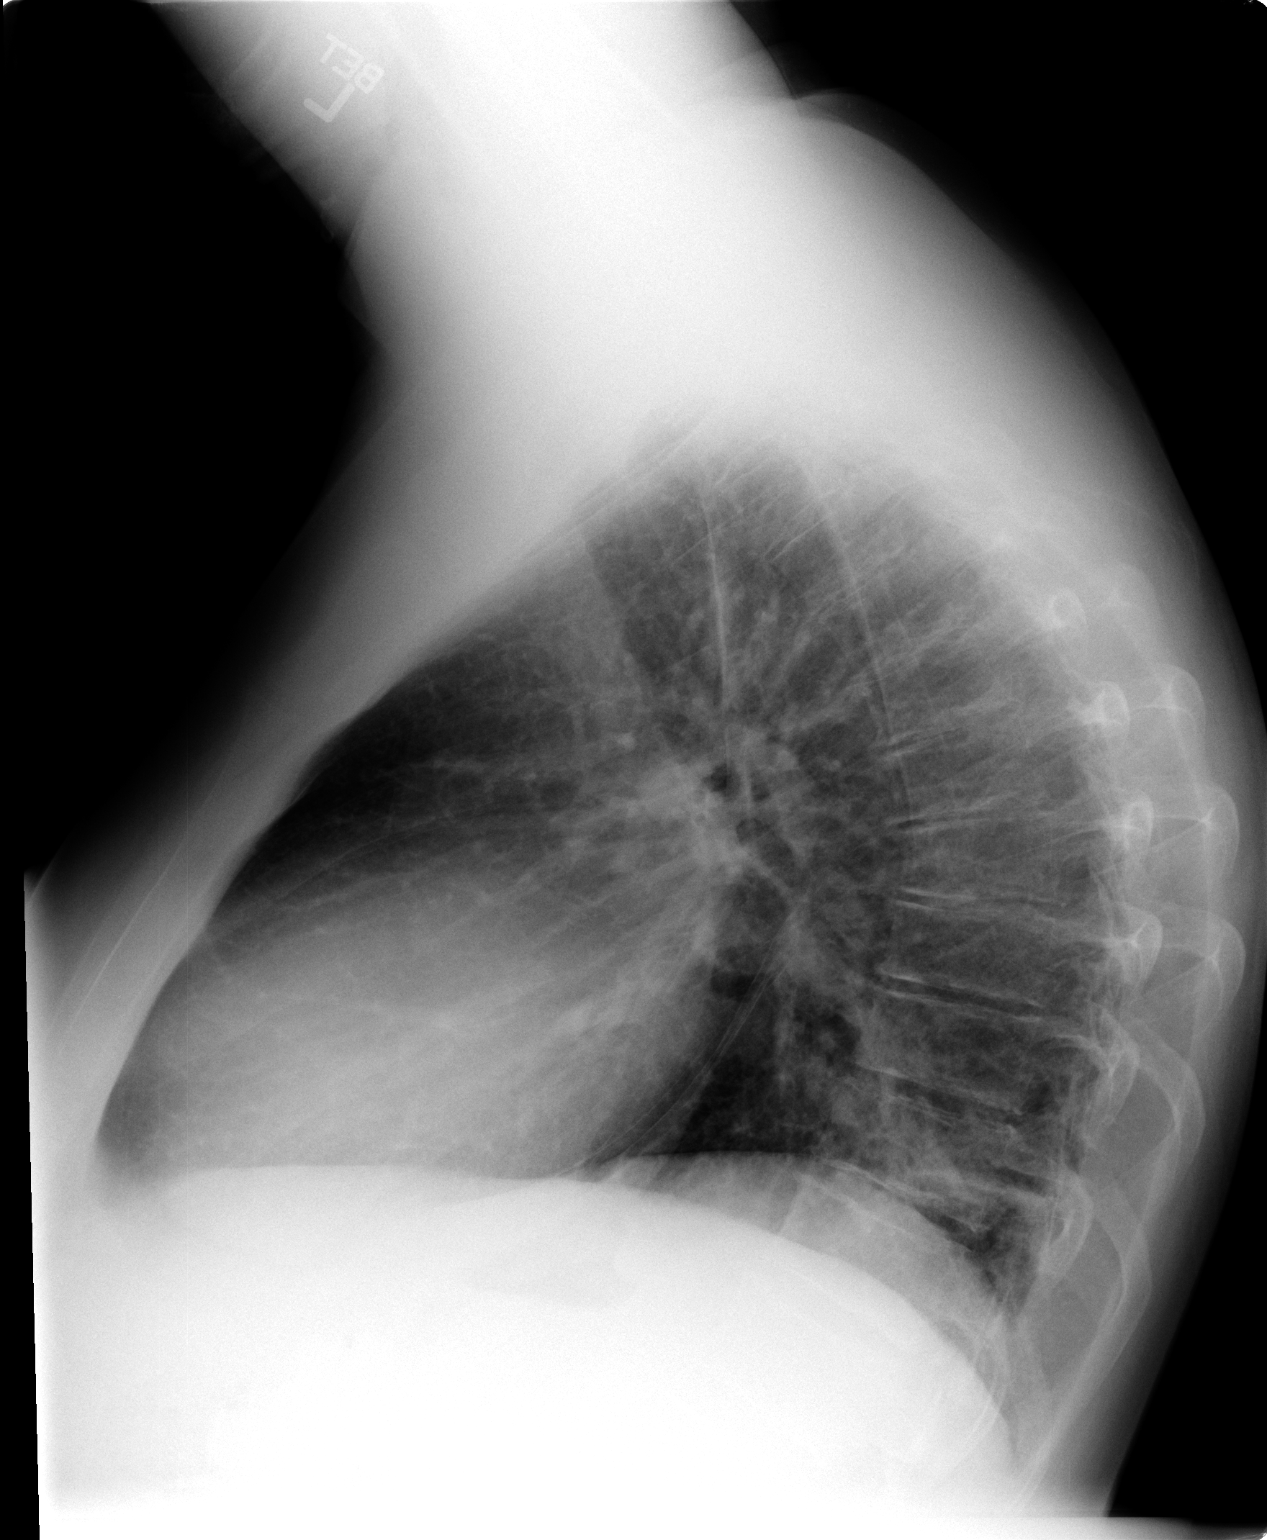

[2 of 2 positions shown; findings below may reference images not displayed]

FINDINGS: Cardiac enlargement with left ventricular configuration.
Normal mediastinal contours and pulmonary vascularity.
Minimal bronchitic changes and chronic right basilar
hypoinflation/atelectasis.
Remaining lungs clear.
Bones unremarkable.
IMPRESSION: Cardiomegaly with minimal bronchitic changes and right basilar
atelectasis.

## 2010-04-30 ENCOUNTER — Inpatient Hospital Stay (HOSPITAL_COMMUNITY): Admission: EM | Admit: 2010-04-30 | Discharge: 2010-05-03 | Payer: Self-pay | Admitting: Emergency Medicine

## 2010-06-14 ENCOUNTER — Observation Stay (HOSPITAL_COMMUNITY): Admission: EM | Admit: 2010-06-14 | Discharge: 2010-06-16 | Payer: Self-pay | Admitting: Emergency Medicine

## 2010-06-16 ENCOUNTER — Telehealth: Payer: Self-pay | Admitting: Gastroenterology

## 2010-06-16 ENCOUNTER — Encounter: Payer: Self-pay | Admitting: Gastroenterology

## 2010-06-16 ENCOUNTER — Ambulatory Visit: Payer: Self-pay | Admitting: Gastroenterology

## 2010-06-17 ENCOUNTER — Encounter: Payer: Self-pay | Admitting: Internal Medicine

## 2010-06-18 HISTORY — PX: ESOPHAGOGASTRODUODENOSCOPY: SHX1529

## 2010-06-19 ENCOUNTER — Emergency Department (HOSPITAL_COMMUNITY): Admission: EM | Admit: 2010-06-19 | Discharge: 2010-06-19 | Payer: Self-pay | Admitting: Emergency Medicine

## 2010-06-20 ENCOUNTER — Observation Stay (HOSPITAL_COMMUNITY): Admission: EM | Admit: 2010-06-20 | Discharge: 2010-06-20 | Payer: Self-pay | Admitting: Emergency Medicine

## 2010-06-21 ENCOUNTER — Inpatient Hospital Stay (HOSPITAL_COMMUNITY): Admission: EM | Admit: 2010-06-21 | Discharge: 2010-06-22 | Payer: Self-pay | Admitting: Emergency Medicine

## 2010-06-23 ENCOUNTER — Encounter: Payer: Self-pay | Admitting: Internal Medicine

## 2010-06-25 ENCOUNTER — Inpatient Hospital Stay (HOSPITAL_COMMUNITY): Admission: EM | Admit: 2010-06-25 | Discharge: 2010-06-26 | Payer: Self-pay | Admitting: Emergency Medicine

## 2010-07-06 ENCOUNTER — Ambulatory Visit: Payer: Self-pay | Admitting: Internal Medicine

## 2010-07-06 ENCOUNTER — Ambulatory Visit (HOSPITAL_COMMUNITY): Admission: RE | Admit: 2010-07-06 | Discharge: 2010-07-06 | Payer: Self-pay | Admitting: Internal Medicine

## 2010-07-14 ENCOUNTER — Encounter: Payer: Self-pay | Admitting: Physician Assistant

## 2010-07-15 ENCOUNTER — Ambulatory Visit: Payer: Self-pay | Admitting: Cardiology

## 2010-07-18 ENCOUNTER — Emergency Department (HOSPITAL_COMMUNITY): Admission: EM | Admit: 2010-07-18 | Discharge: 2010-07-18 | Payer: Self-pay | Admitting: Emergency Medicine

## 2010-07-21 ENCOUNTER — Inpatient Hospital Stay (HOSPITAL_COMMUNITY): Admission: EM | Admit: 2010-07-21 | Discharge: 2010-07-23 | Payer: Self-pay | Admitting: Emergency Medicine

## 2010-07-21 IMAGING — CR DG CHEST 1V PORT
1 series · 1 of 1 positions shown · non-contrast
Comparison: 10/13/2008

CLINICAL DATA: Chest pain

PORTABLE CHEST - 1 VIEW

[AP]
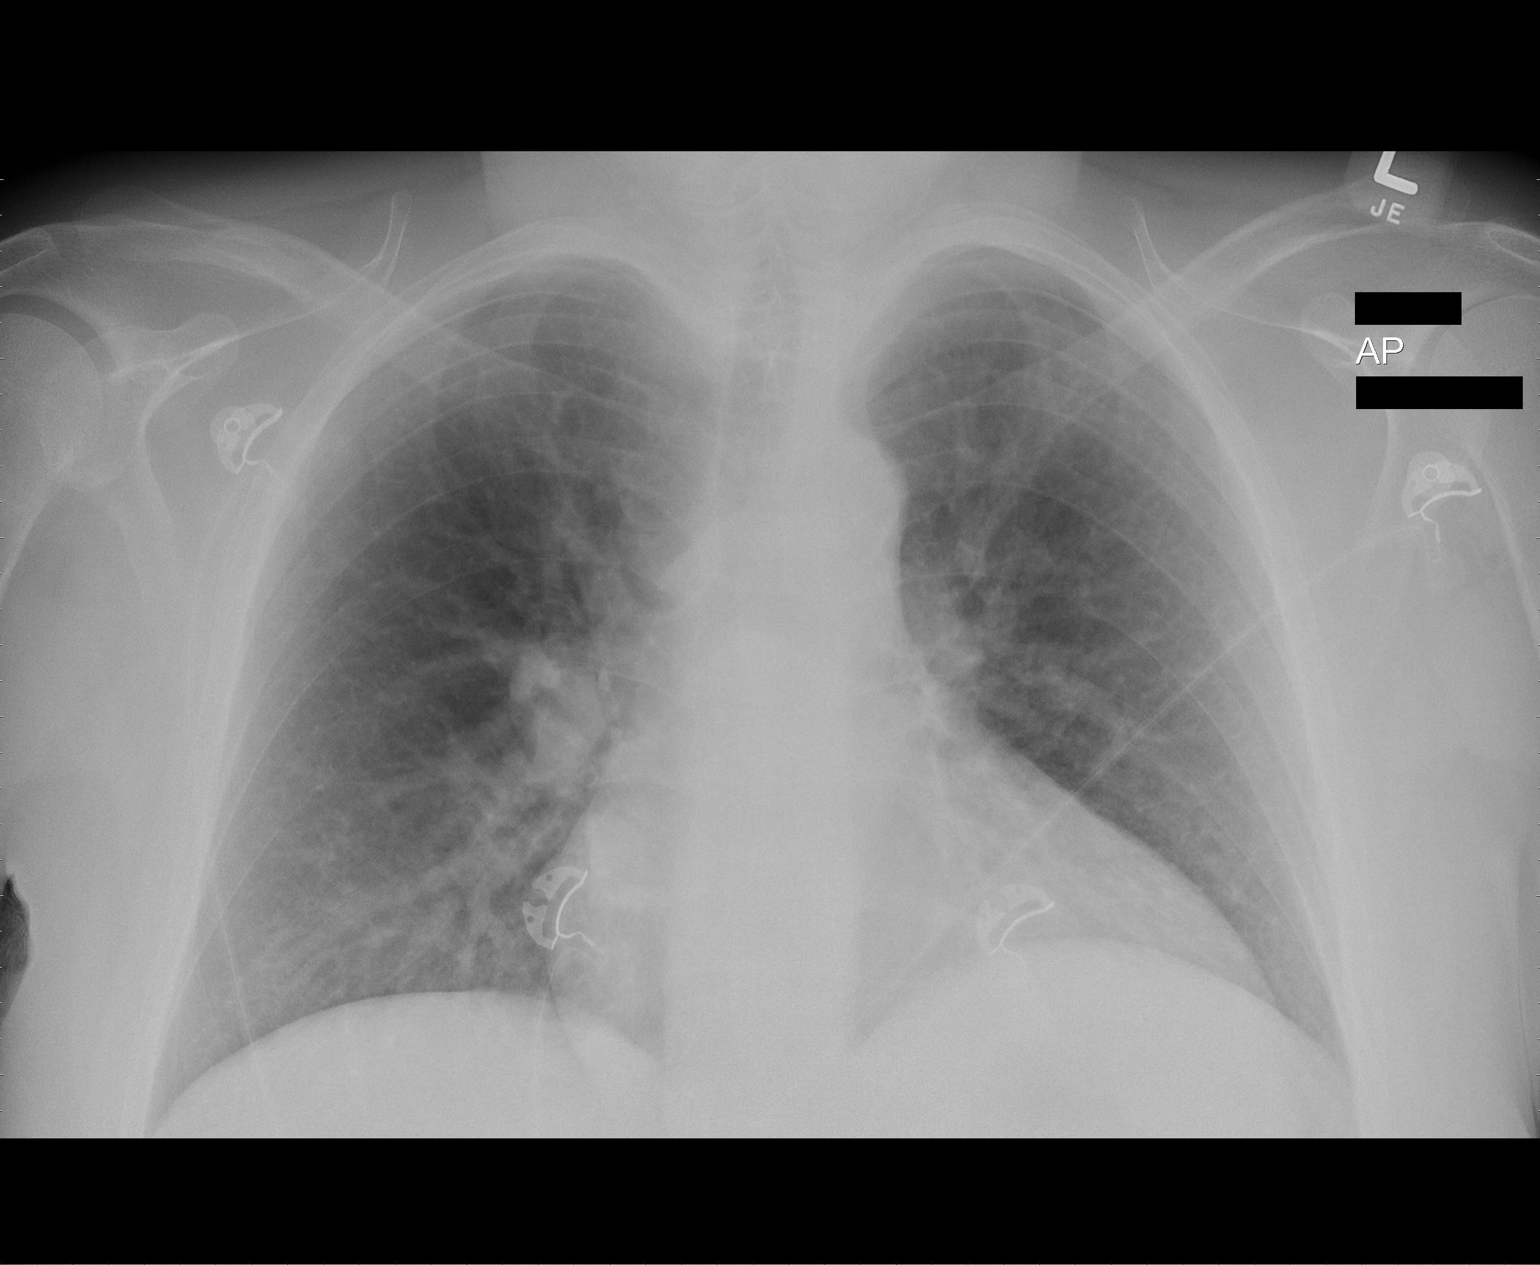

[1 of 1 positions shown; findings below may reference images not displayed]

FINDINGS: The heart is mildly enlarged.  Vascular congestion is
present without interstitial edema.  No pneumothorax or effusion.
No consolidation.
IMPRESSION: Mild cardiomegaly and vascular congestion.

## 2010-08-04 IMAGING — CR DG CHEST 1V PORT
1 series · 1 of 1 positions shown · non-contrast
Comparison: 01/05/2009 and earlier.

CLINICAL DATA: 32-year-old male with shortness of breath.

PORTABLE CHEST - 1 VIEW

[view not recorded]
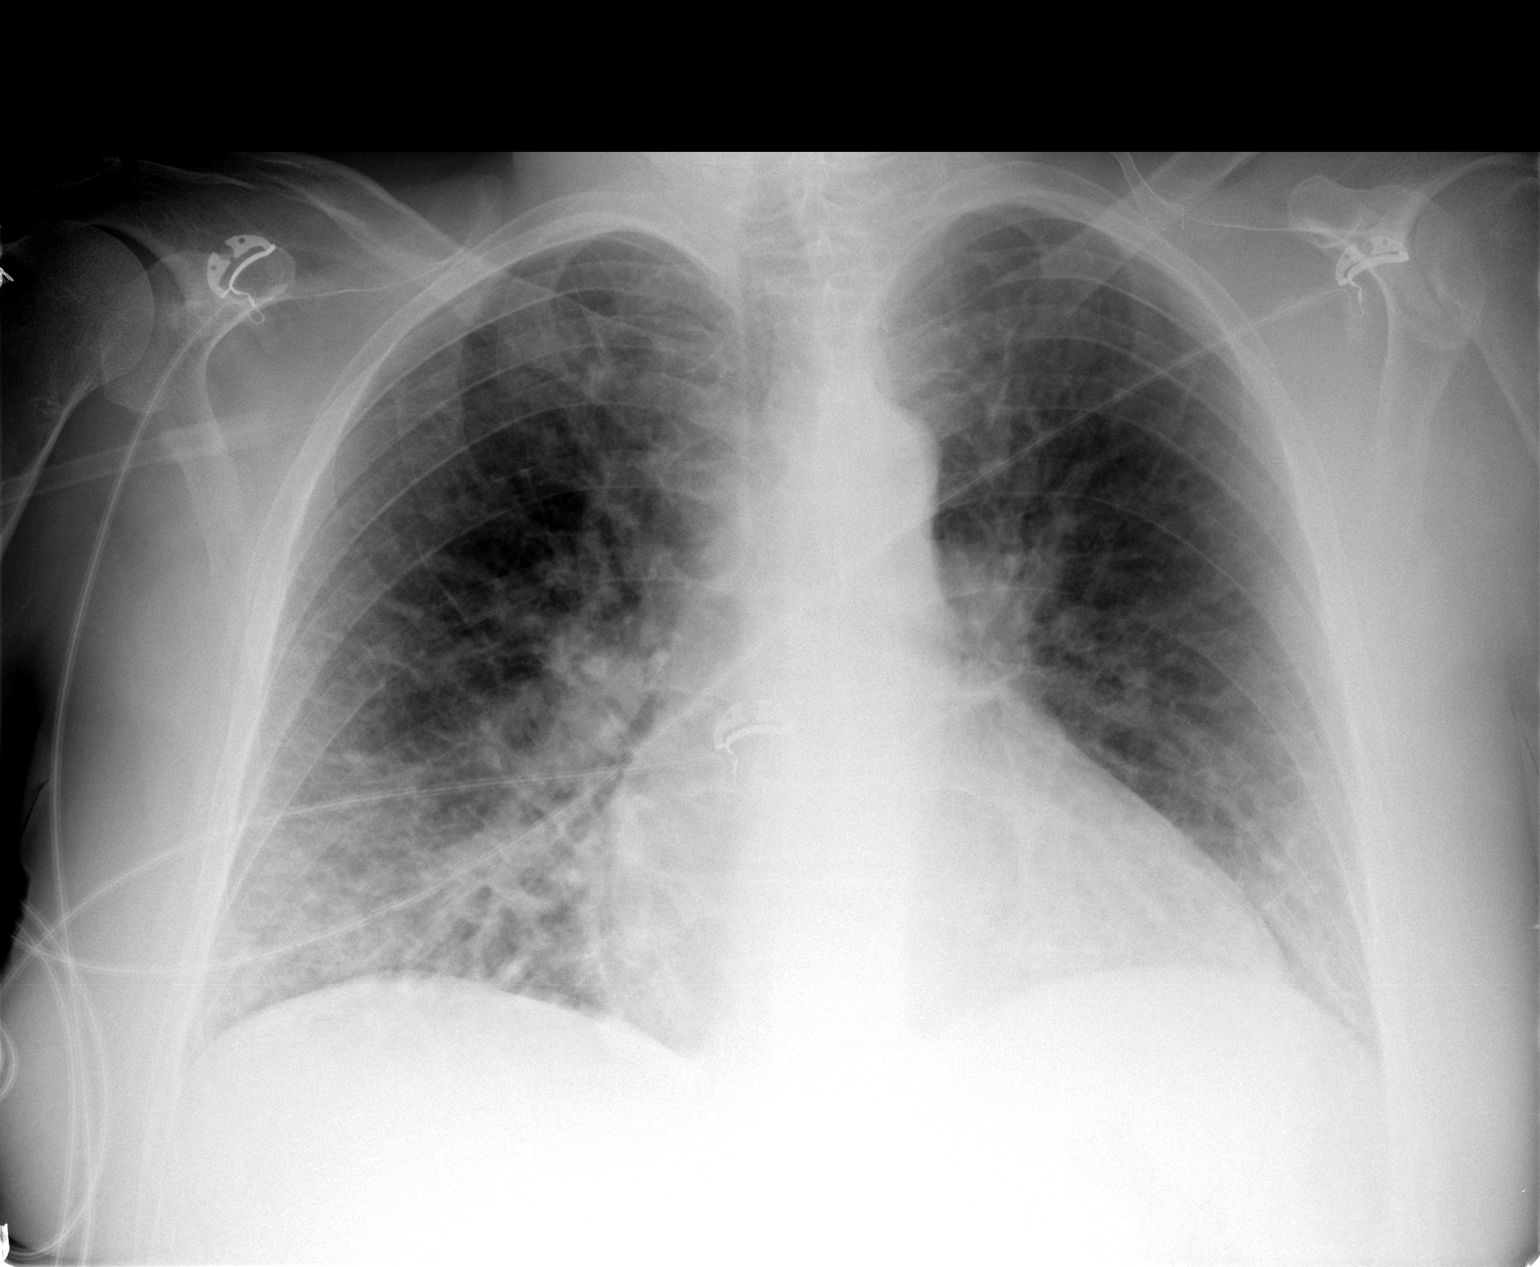

[1 of 1 positions shown; findings below may reference images not displayed]

FINDINGS: Stable lung volumes.  Stable cardiac size and mediastinal
contours.  Pulmonary vascular congestion without overt edema.
Increased atelectasis at the right lung base.  No pneumothorax,
pleural effusion or consolidation.
IMPRESSION: Increased atelectasis at the right lung base.  Pulmonary vascular
congestion without overt pulmonary edema.

## 2010-08-05 IMAGING — CR DG CHEST 1V PORT
1 series · 1 of 1 positions shown · non-contrast
Comparison: 01/19/2009

CLINICAL DATA: Short of breath.  Abnormal EKG.

PORTABLE CHEST - 1 VIEW at 2772 hours:

[view not recorded]
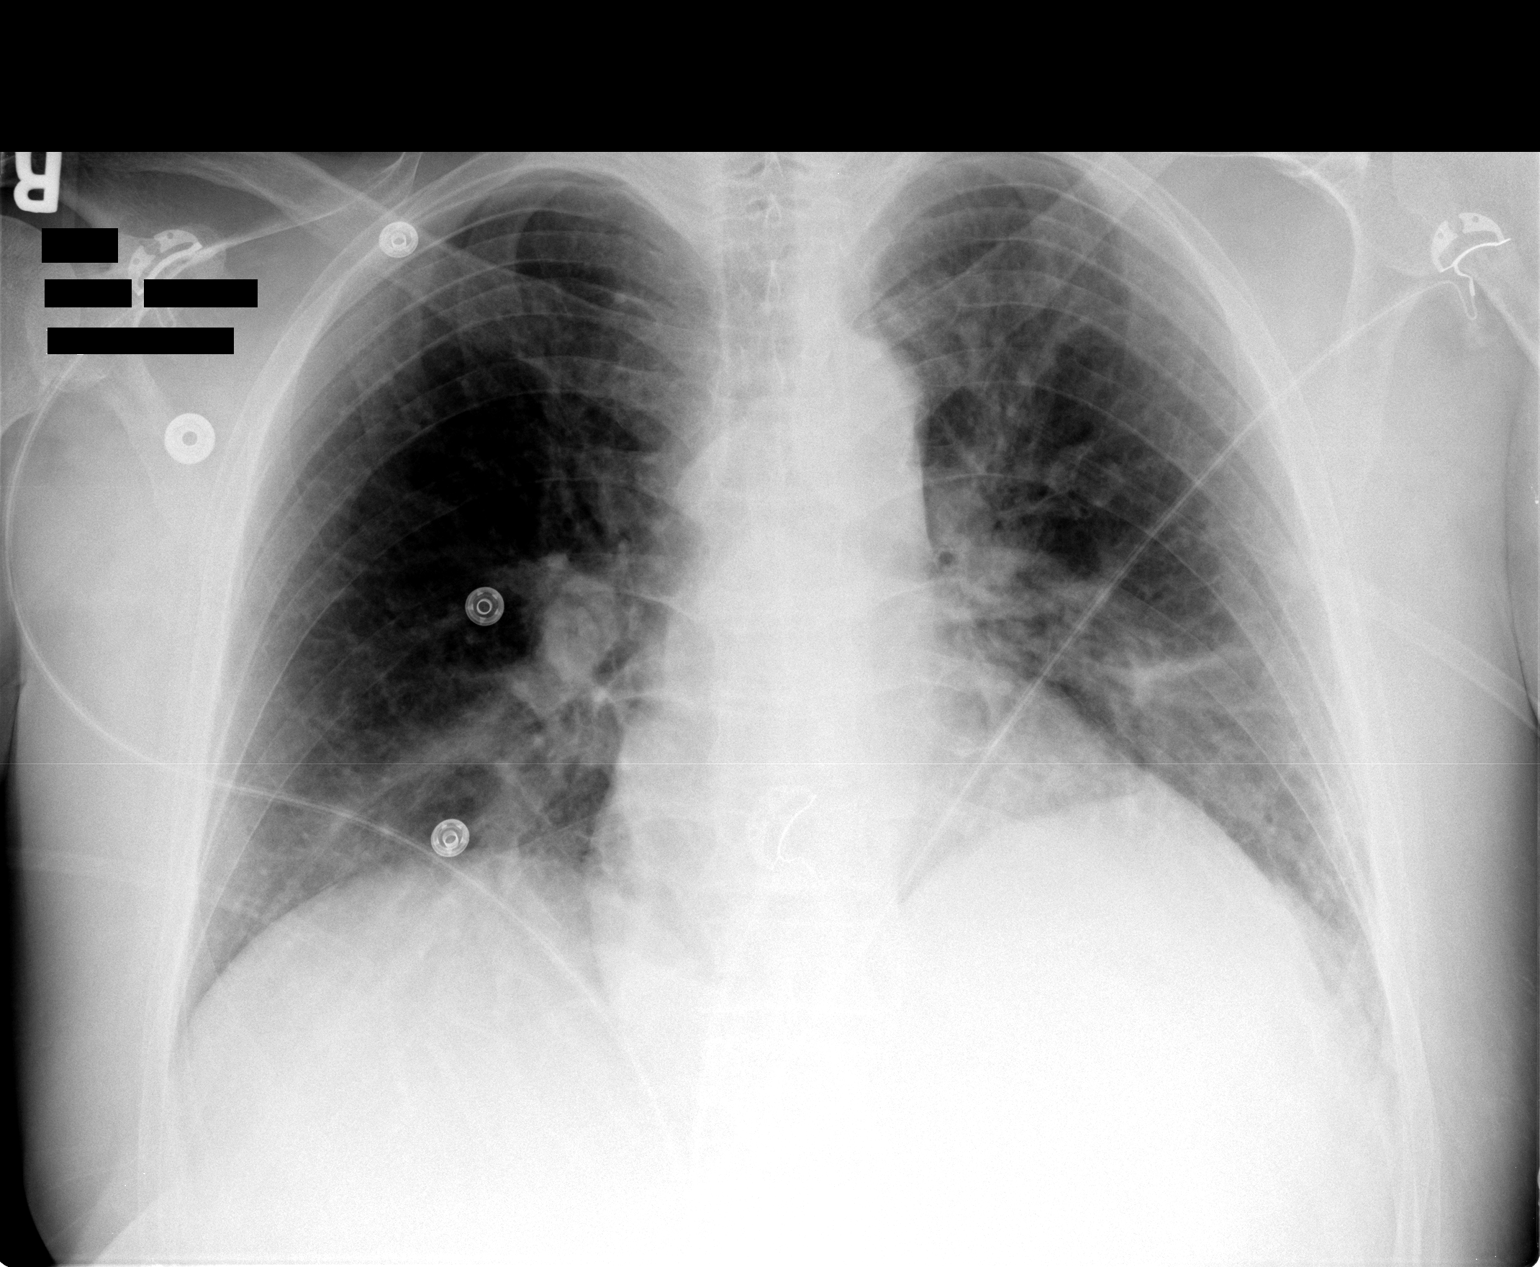

[1 of 1 positions shown; findings below may reference images not displayed]

FINDINGS: Mild atelectasis at the lung bases.  Cardiomegaly.
Decrease in degree of pulmonary vascular congestion.  Moderate
congestion on today's exam.
IMPRESSION: Mild bibasilar atelectasis.  Improvement in moderate vascular
congestion.  No frank pulmonary edema.

REF:G3 DICTATED: 01/20/2009 [DATE]

## 2010-08-09 ENCOUNTER — Emergency Department (HOSPITAL_COMMUNITY): Admission: EM | Admit: 2010-08-09 | Discharge: 2010-08-10 | Payer: Self-pay | Admitting: Emergency Medicine

## 2010-08-11 ENCOUNTER — Encounter: Payer: Self-pay | Admitting: Cardiology

## 2010-08-14 ENCOUNTER — Emergency Department (HOSPITAL_COMMUNITY): Admission: EM | Admit: 2010-08-14 | Discharge: 2010-08-14 | Payer: Self-pay | Admitting: Emergency Medicine

## 2010-08-21 IMAGING — CR DG CHEST 1V PORT
1 series · 1 of 1 positions shown · non-contrast
Comparison: 01/20/2009

CLINICAL DATA: Shortness of breath

PORTABLE CHEST - 1 VIEW

[view not recorded]
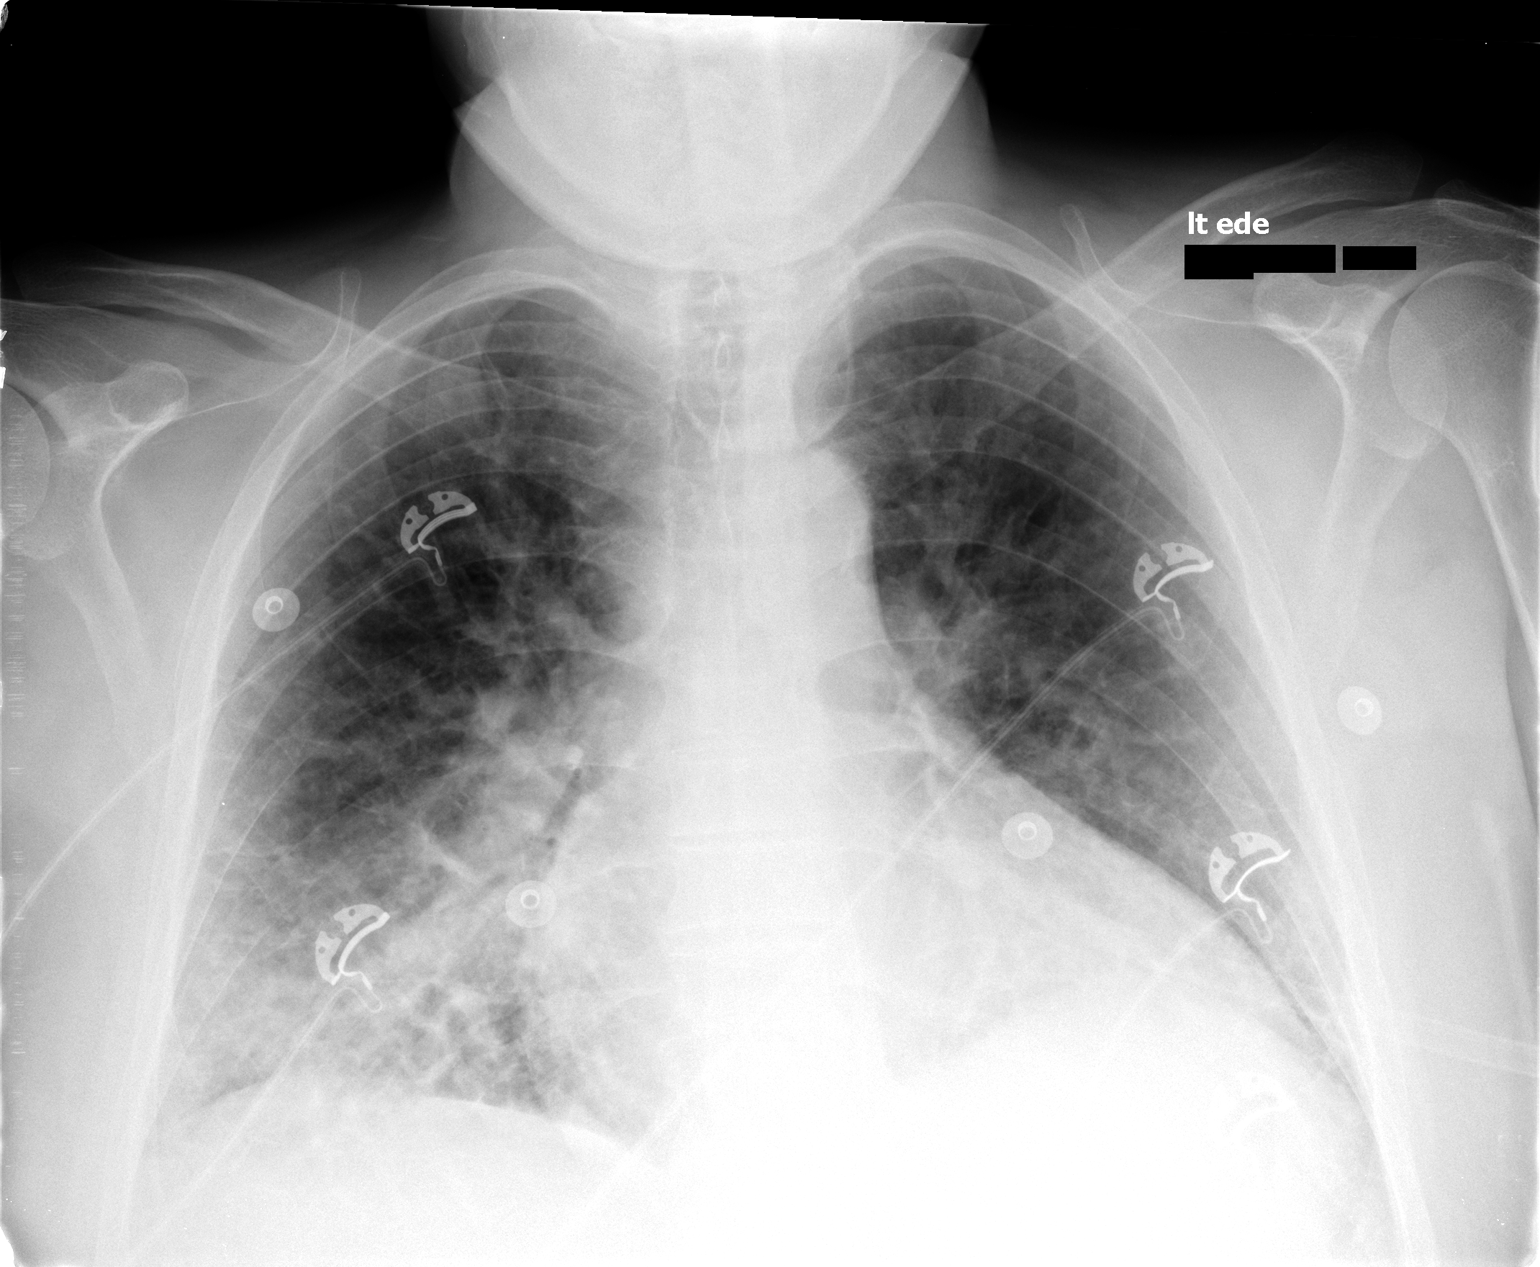

[1 of 1 positions shown; findings below may reference images not displayed]

FINDINGS: Heart size is enlarged.

There is airspace disease within the right lower lobe.

Superimposed pulmonary edema is also noted.
IMPRESSION: 1.  Cardiac enlargement and pulmonary edema.
2.  Right lower lobe infiltrate

## 2010-08-22 IMAGING — CR DG CHEST 1V PORT
1 series · 1 of 1 positions shown · non-contrast
Comparison: 02/05/2009

CLINICAL DATA: Post dialysis.  Fluid overload.

PORTABLE CHEST - 1 VIEW

[AP]
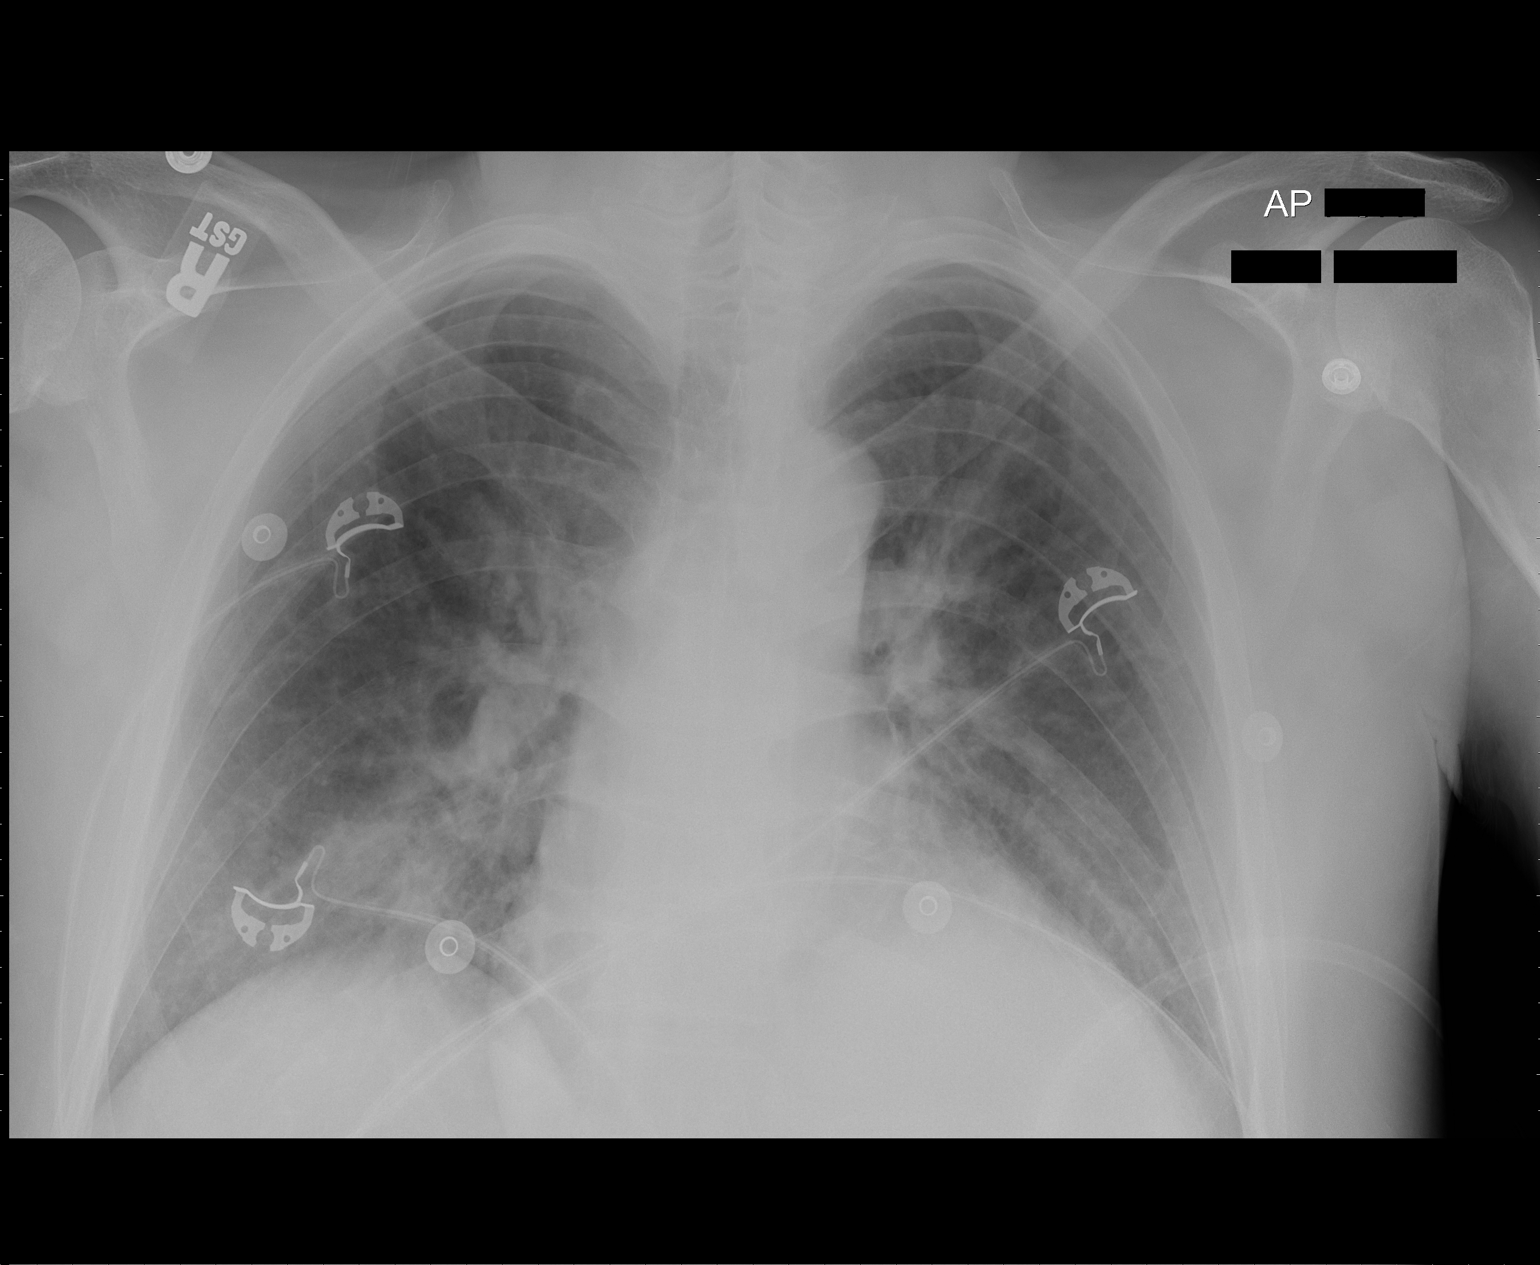

[1 of 1 positions shown; findings below may reference images not displayed]

FINDINGS: Lung volumes are lower than on the prior exam.
Cardiomegaly persists.  Right base airspace disease is mildly
improved allowing for differences in technique.  Pulmonary vascular
congestion remains present.  Probable small left pleural effusion.
Mild to moderate pulmonary edema persists.
IMPRESSION: 1.  Little interval change when making allowances for differences
in technique.  Slight improvement in aeration of the right lung
base.
2.  Cardiomegaly.
3.  Low lung volumes.

## 2010-08-23 ENCOUNTER — Inpatient Hospital Stay (HOSPITAL_COMMUNITY): Admission: EM | Admit: 2010-08-23 | Discharge: 2010-08-25 | Payer: Self-pay | Admitting: Emergency Medicine

## 2010-09-04 ENCOUNTER — Observation Stay (HOSPITAL_COMMUNITY): Admission: EM | Admit: 2010-09-04 | Discharge: 2010-09-06 | Payer: Self-pay | Admitting: Emergency Medicine

## 2010-09-06 ENCOUNTER — Encounter (INDEPENDENT_AMBULATORY_CARE_PROVIDER_SITE_OTHER): Payer: Self-pay | Admitting: Internal Medicine

## 2010-09-07 ENCOUNTER — Observation Stay (HOSPITAL_COMMUNITY): Admission: EM | Admit: 2010-09-07 | Discharge: 2010-09-08 | Payer: Self-pay | Admitting: Emergency Medicine

## 2010-09-12 ENCOUNTER — Emergency Department (HOSPITAL_COMMUNITY): Admission: EM | Admit: 2010-09-12 | Discharge: 2010-09-12 | Payer: Self-pay | Admitting: Emergency Medicine

## 2010-09-13 ENCOUNTER — Emergency Department (HOSPITAL_COMMUNITY): Admission: EM | Admit: 2010-09-13 | Discharge: 2010-09-13 | Payer: Self-pay | Admitting: Emergency Medicine

## 2010-09-15 ENCOUNTER — Emergency Department (HOSPITAL_COMMUNITY): Admission: EM | Admit: 2010-09-15 | Discharge: 2010-09-15 | Payer: Self-pay | Admitting: Emergency Medicine

## 2010-09-15 ENCOUNTER — Emergency Department (HOSPITAL_COMMUNITY): Admission: EM | Admit: 2010-09-15 | Discharge: 2010-09-16 | Payer: Self-pay | Admitting: Emergency Medicine

## 2010-09-19 ENCOUNTER — Inpatient Hospital Stay (HOSPITAL_COMMUNITY): Admission: EM | Admit: 2010-09-19 | Discharge: 2010-09-21 | Payer: Self-pay | Admitting: Emergency Medicine

## 2010-09-20 IMAGING — CR DG HIP (WITH OR WITHOUT PELVIS) 2-3V*L*
3 series · 3 of 3 positions shown · non-contrast
Comparison: None.

CLINICAL DATA: Fall.  Left hip pain.

LEFT HIP - COMPLETE 2+ VIEW

[view not recorded (1 of 3)]
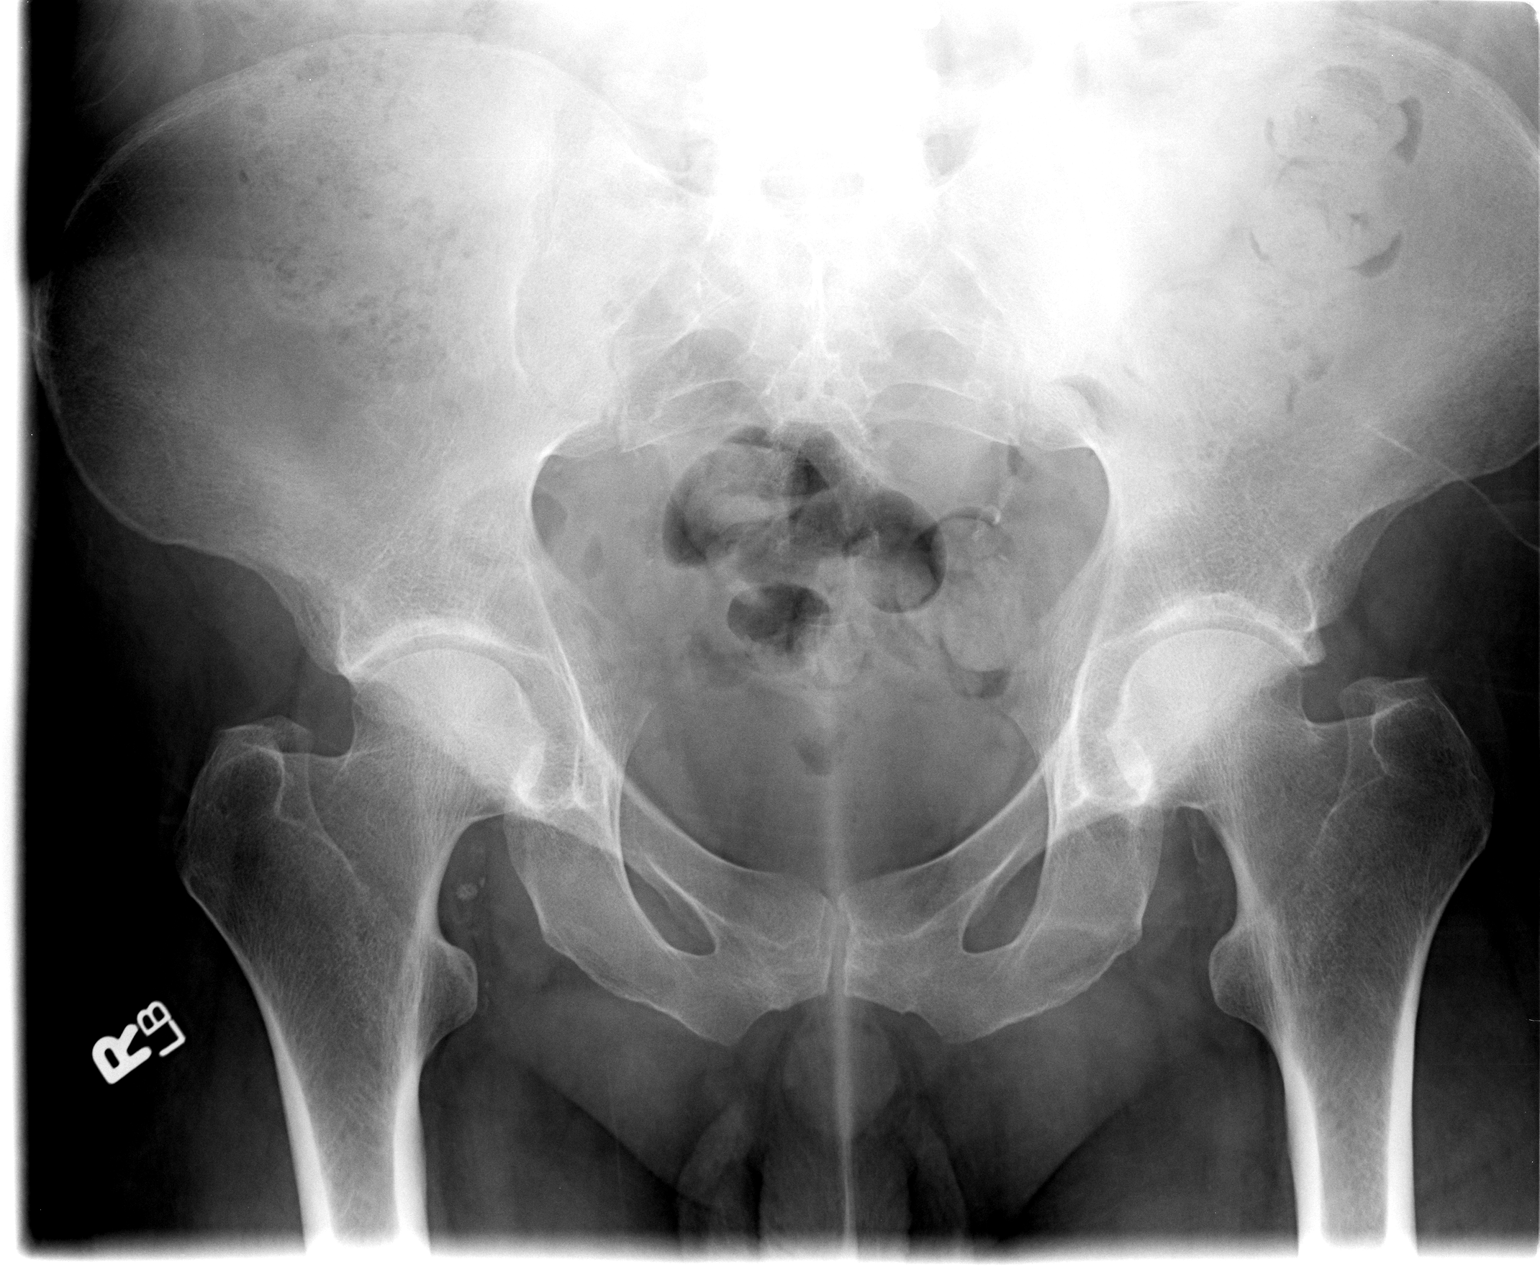

[view not recorded (2 of 3)]
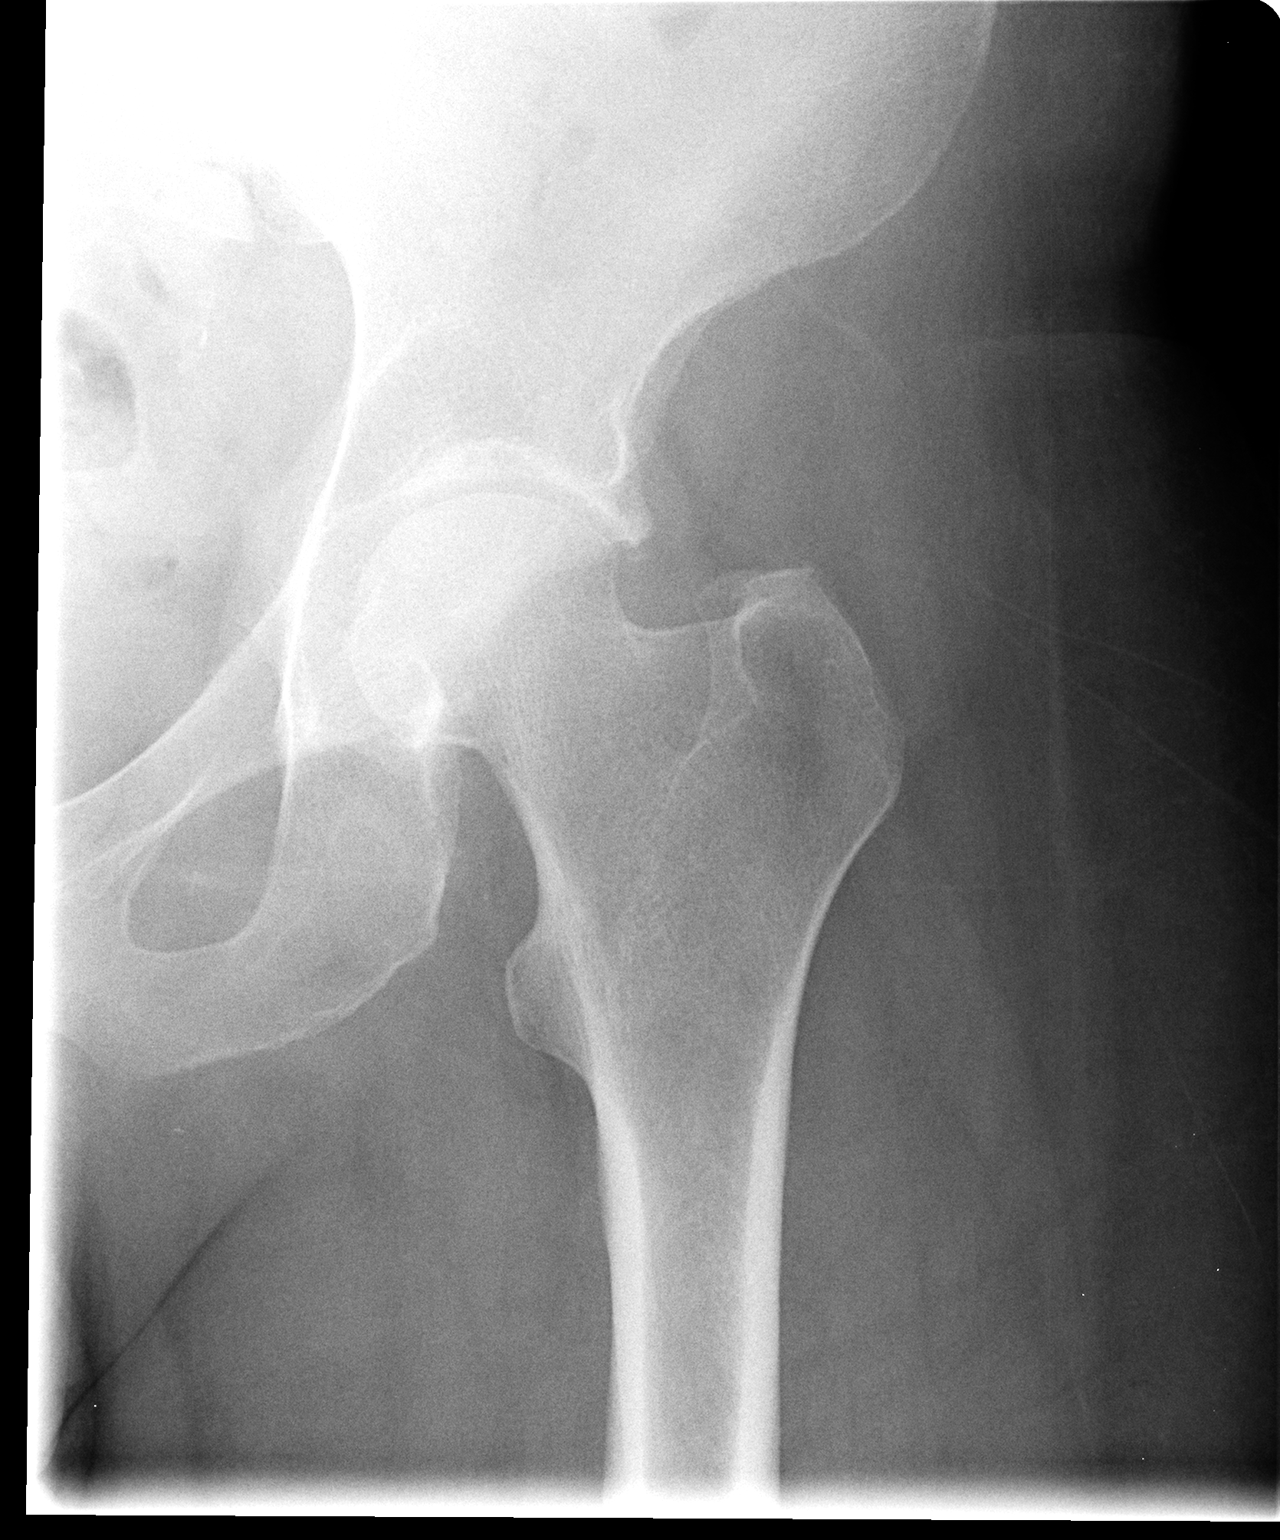

[view not recorded (3 of 3)]
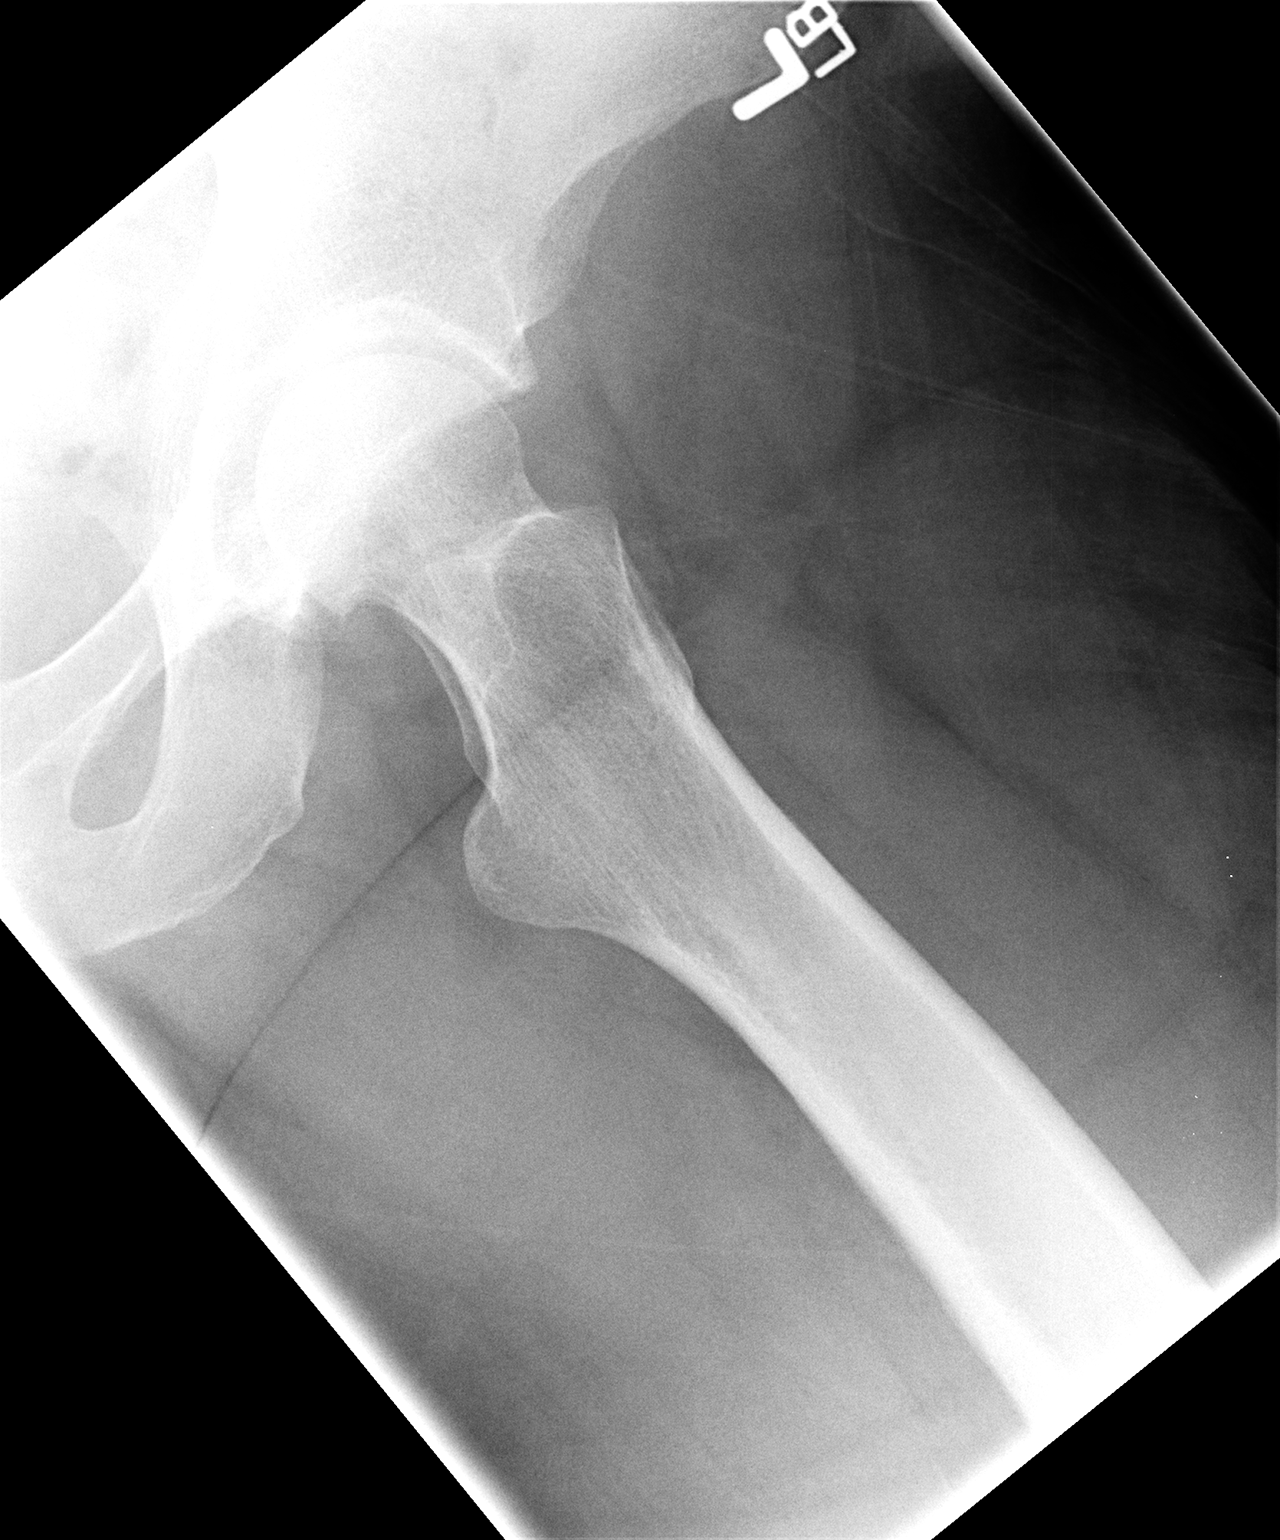

[3 of 3 positions shown; findings below may reference images not displayed]

FINDINGS: Frontal pelvis with AP and frog-leg lateral views of the
left hip show no evidence for acute fracture.  Joint space in the
hips is symmetric.  Symphysis pubis and SI joints are unremarkable.
Arcuate lines of the sacrum are preserved.

Mild spurring is seen from the femoral head.  No worrisome lytic or
sclerotic osseous lesion.
IMPRESSION: No acute bony abnormality.

## 2010-09-23 ENCOUNTER — Emergency Department (HOSPITAL_COMMUNITY): Admission: EM | Admit: 2010-09-23 | Discharge: 2010-09-23 | Payer: Self-pay | Admitting: Emergency Medicine

## 2010-09-23 ENCOUNTER — Encounter: Payer: Self-pay | Admitting: Cardiology

## 2010-09-24 ENCOUNTER — Encounter: Payer: Self-pay | Admitting: Cardiology

## 2010-09-25 ENCOUNTER — Emergency Department (HOSPITAL_COMMUNITY): Admission: EM | Admit: 2010-09-25 | Discharge: 2010-09-26 | Payer: Self-pay | Admitting: Emergency Medicine

## 2010-09-27 ENCOUNTER — Emergency Department (HOSPITAL_COMMUNITY): Admission: EM | Admit: 2010-09-27 | Discharge: 2010-09-27 | Payer: Self-pay | Admitting: Emergency Medicine

## 2010-09-28 ENCOUNTER — Emergency Department (HOSPITAL_COMMUNITY)
Admission: EM | Admit: 2010-09-28 | Discharge: 2010-09-28 | Payer: Self-pay | Source: Home / Self Care | Admitting: Emergency Medicine

## 2010-09-29 ENCOUNTER — Emergency Department (HOSPITAL_COMMUNITY): Admission: EM | Admit: 2010-09-29 | Discharge: 2010-09-29 | Payer: Self-pay | Admitting: Emergency Medicine

## 2010-10-01 ENCOUNTER — Emergency Department (HOSPITAL_COMMUNITY): Admission: EM | Admit: 2010-10-01 | Discharge: 2010-10-01 | Payer: Self-pay | Admitting: Emergency Medicine

## 2010-10-02 ENCOUNTER — Ambulatory Visit: Payer: Self-pay | Admitting: Cardiology

## 2010-10-04 ENCOUNTER — Ambulatory Visit: Payer: Self-pay | Admitting: Cardiology

## 2010-10-04 ENCOUNTER — Inpatient Hospital Stay (HOSPITAL_COMMUNITY): Admission: EM | Admit: 2010-10-04 | Discharge: 2010-10-07 | Payer: Self-pay | Admitting: Emergency Medicine

## 2010-10-05 ENCOUNTER — Encounter: Payer: Self-pay | Admitting: Cardiology

## 2010-10-07 ENCOUNTER — Encounter: Payer: Self-pay | Admitting: Physician Assistant

## 2010-10-13 ENCOUNTER — Ambulatory Visit: Payer: Self-pay | Admitting: Internal Medicine

## 2010-10-13 ENCOUNTER — Emergency Department (HOSPITAL_COMMUNITY): Admission: EM | Admit: 2010-10-13 | Discharge: 2010-10-13 | Payer: Self-pay | Admitting: Emergency Medicine

## 2010-10-13 DIAGNOSIS — R1011 Right upper quadrant pain: Secondary | ICD-10-CM | POA: Insufficient documentation

## 2010-10-13 DIAGNOSIS — R110 Nausea: Secondary | ICD-10-CM | POA: Insufficient documentation

## 2010-10-14 ENCOUNTER — Encounter: Payer: Self-pay | Admitting: Gastroenterology

## 2010-10-18 ENCOUNTER — Emergency Department (HOSPITAL_COMMUNITY): Admission: EM | Admit: 2010-10-18 | Discharge: 2010-10-19 | Payer: Self-pay | Admitting: Emergency Medicine

## 2010-10-19 ENCOUNTER — Encounter: Payer: Self-pay | Admitting: Gastroenterology

## 2010-10-20 ENCOUNTER — Encounter (HOSPITAL_COMMUNITY)
Admission: RE | Admit: 2010-10-20 | Discharge: 2010-11-19 | Payer: Self-pay | Source: Home / Self Care | Admitting: Internal Medicine

## 2010-10-22 ENCOUNTER — Observation Stay (HOSPITAL_COMMUNITY): Admission: EM | Admit: 2010-10-22 | Discharge: 2010-10-23 | Payer: Self-pay | Admitting: Emergency Medicine

## 2010-10-25 ENCOUNTER — Emergency Department (HOSPITAL_COMMUNITY): Admission: EM | Admit: 2010-10-25 | Discharge: 2010-10-25 | Payer: Self-pay | Admitting: Emergency Medicine

## 2010-10-25 LAB — CONVERTED CEMR LAB
Albumin: 4.6 g/dL (ref 3.5–5.2)
Alkaline Phosphatase: 111 units/L (ref 39–117)
BUN: 10 mg/dL (ref 6–23)
Calcium: 9.5 mg/dL (ref 8.4–10.5)
Chloride: 98 meq/L (ref 96–112)
Eosinophils Absolute: 0.2 10*3/uL (ref 0.0–0.7)
Glucose, Bld: 95 mg/dL (ref 70–99)
Hemoglobin: 8.8 g/dL — ABNORMAL LOW (ref 13.0–17.0)
Lymphs Abs: 1.3 10*3/uL (ref 0.7–4.0)
MCV: 88.4 fL (ref 78.0–100.0)
Monocytes Absolute: 0.5 10*3/uL (ref 0.1–1.0)
Monocytes Relative: 13 % — ABNORMAL HIGH (ref 3–12)
Neutrophils Relative %: 52 % (ref 43–77)
Potassium: 4 meq/L (ref 3.5–5.3)
RBC: 3.1 M/uL — ABNORMAL LOW (ref 4.22–5.81)
WBC: 4.2 10*3/uL (ref 4.0–10.5)

## 2010-10-27 ENCOUNTER — Emergency Department (HOSPITAL_COMMUNITY): Admission: EM | Admit: 2010-10-27 | Discharge: 2010-10-27 | Payer: Self-pay | Admitting: Emergency Medicine

## 2010-10-29 ENCOUNTER — Observation Stay (HOSPITAL_COMMUNITY): Admission: EM | Admit: 2010-10-29 | Discharge: 2010-10-30 | Payer: Self-pay | Admitting: Emergency Medicine

## 2010-10-30 IMAGING — CR DG KNEE COMPLETE 4+V*L*
4 series · 4 of 4 positions shown · non-contrast
Comparison: None

CLINICAL DATA: Minor trauma.  Unable to bear weight.

LEFT KNEE - COMPLETE 4+ VIEW

[view not recorded (1 of 4)]
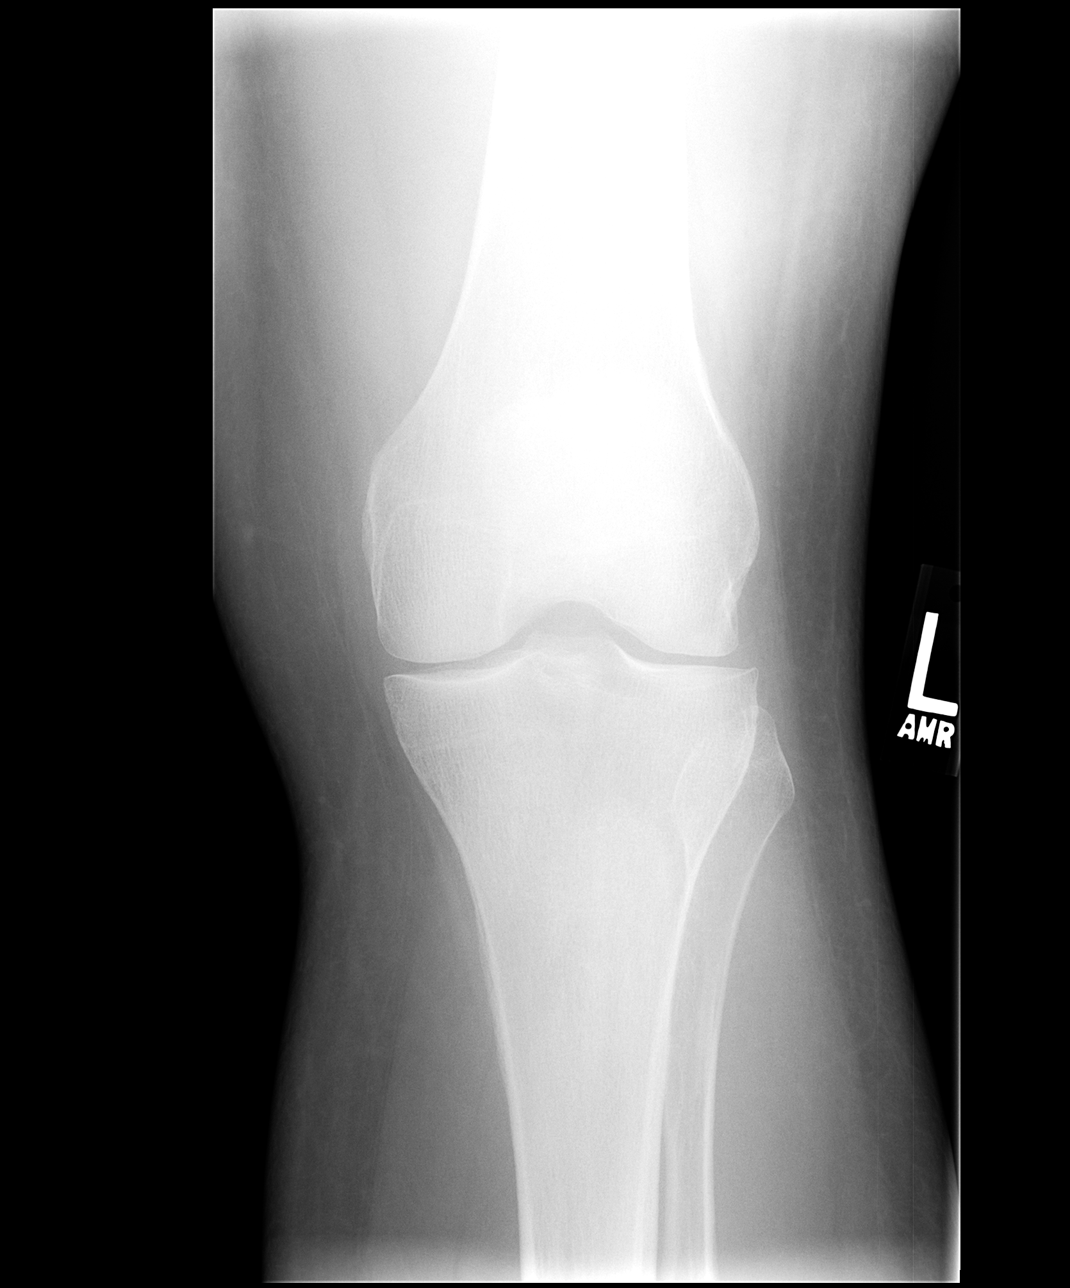

[view not recorded (2 of 4)]
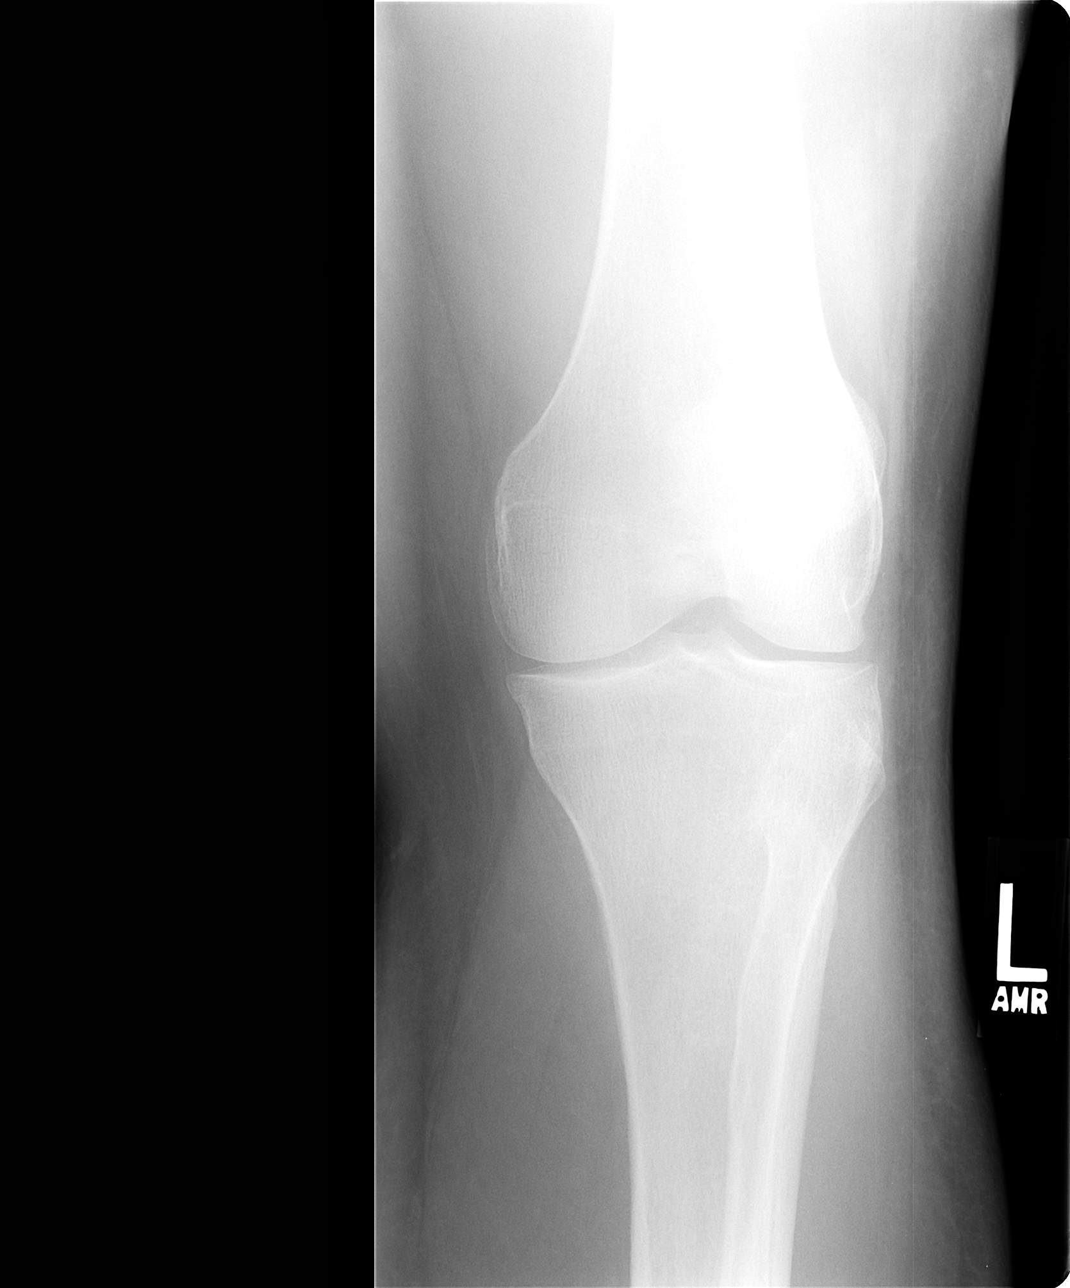

[view not recorded (3 of 4)]
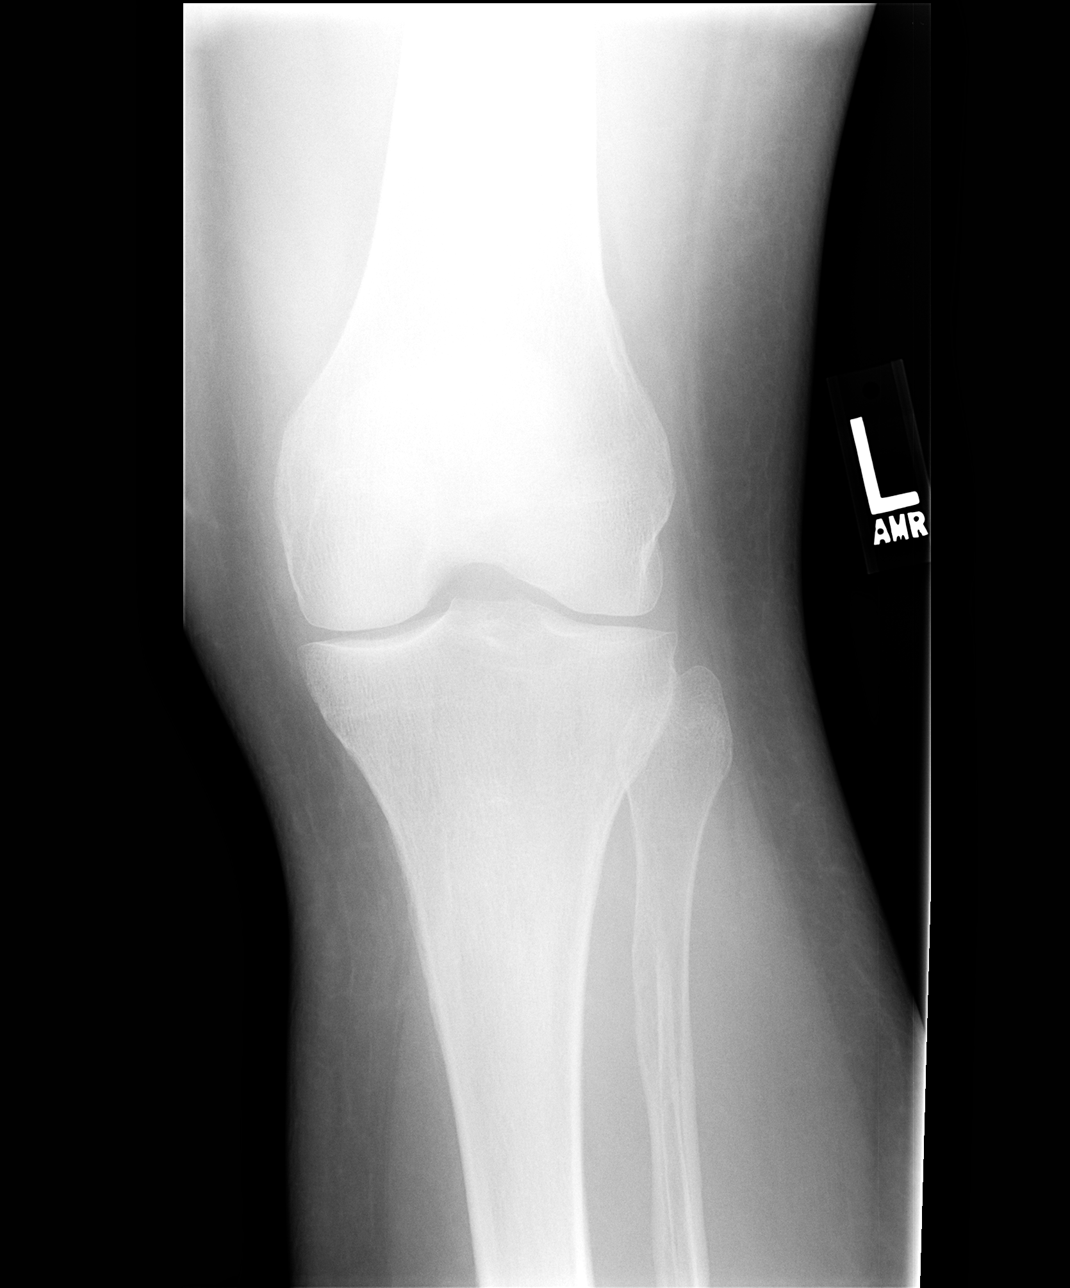

[view not recorded (4 of 4)]
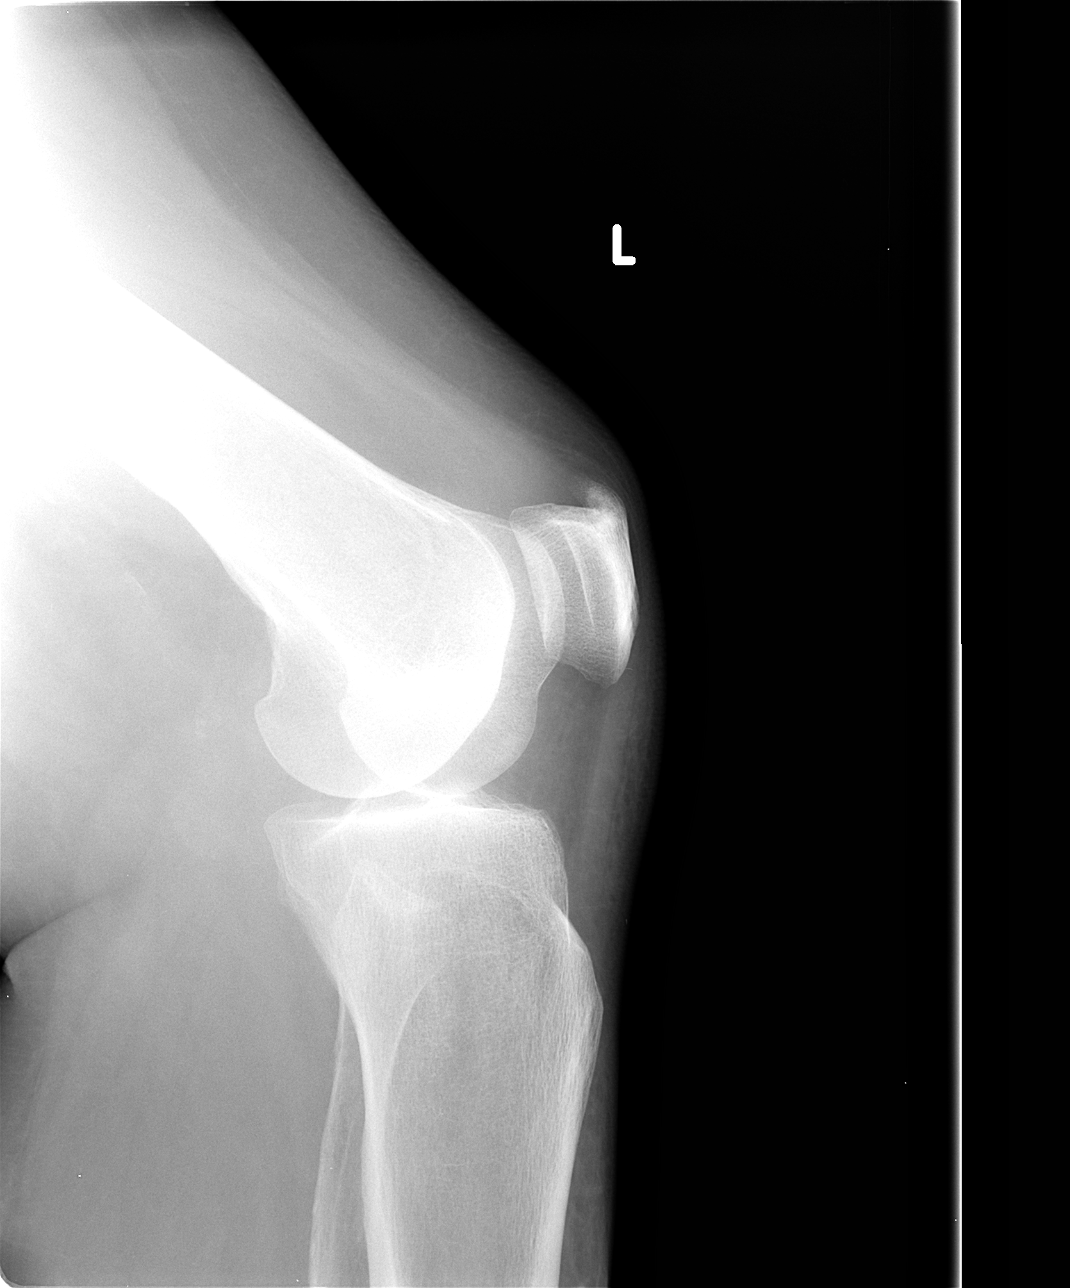

[4 of 4 positions shown; findings below may reference images not displayed]

FINDINGS: Mild three compartment joint space narrowing. No acute
fracture or dislocation.  Small suprapatellar joint effusion.
Enthesophyte at the quadriceps insertion.
IMPRESSION: 1.  Small joint effusion without acute osseous abnormality.
2.  Mild three compartment osteoarthritis.

## 2010-11-06 ENCOUNTER — Emergency Department (HOSPITAL_COMMUNITY): Admission: EM | Admit: 2010-11-06 | Discharge: 2010-11-06 | Payer: Self-pay | Admitting: Emergency Medicine

## 2010-11-08 ENCOUNTER — Emergency Department (HOSPITAL_COMMUNITY): Admission: EM | Admit: 2010-11-08 | Discharge: 2010-11-08 | Payer: Self-pay | Admitting: Emergency Medicine

## 2010-11-10 ENCOUNTER — Emergency Department (HOSPITAL_COMMUNITY): Admission: EM | Admit: 2010-11-10 | Discharge: 2010-11-10 | Payer: Self-pay | Admitting: Emergency Medicine

## 2010-11-24 ENCOUNTER — Ambulatory Visit: Payer: Self-pay | Admitting: Internal Medicine

## 2010-11-25 ENCOUNTER — Encounter: Payer: Self-pay | Admitting: Internal Medicine

## 2010-12-01 ENCOUNTER — Emergency Department (HOSPITAL_COMMUNITY)
Admission: EM | Admit: 2010-12-01 | Discharge: 2010-12-01 | Payer: Self-pay | Source: Home / Self Care | Admitting: Emergency Medicine

## 2010-12-05 ENCOUNTER — Emergency Department (HOSPITAL_COMMUNITY)
Admission: EM | Admit: 2010-12-05 | Discharge: 2010-12-05 | Payer: Self-pay | Source: Home / Self Care | Admitting: Emergency Medicine

## 2010-12-13 ENCOUNTER — Emergency Department (HOSPITAL_COMMUNITY)
Admission: EM | Admit: 2010-12-13 | Discharge: 2010-12-14 | Payer: Self-pay | Source: Home / Self Care | Admitting: Emergency Medicine

## 2010-12-16 ENCOUNTER — Inpatient Hospital Stay (HOSPITAL_COMMUNITY)
Admission: EM | Admit: 2010-12-16 | Discharge: 2010-12-19 | Payer: Self-pay | Source: Home / Self Care | Attending: Family Medicine | Admitting: Family Medicine

## 2010-12-19 ENCOUNTER — Emergency Department (HOSPITAL_COMMUNITY)
Admission: EM | Admit: 2010-12-19 | Discharge: 2010-12-20 | Payer: Self-pay | Source: Home / Self Care | Admitting: Emergency Medicine

## 2010-12-22 ENCOUNTER — Emergency Department (HOSPITAL_COMMUNITY)
Admission: EM | Admit: 2010-12-22 | Discharge: 2010-12-22 | Payer: Self-pay | Source: Home / Self Care | Admitting: Emergency Medicine

## 2010-12-24 ENCOUNTER — Emergency Department (HOSPITAL_COMMUNITY)
Admission: EM | Admit: 2010-12-24 | Discharge: 2010-12-24 | Payer: Self-pay | Source: Home / Self Care | Admitting: Emergency Medicine

## 2010-12-29 ENCOUNTER — Emergency Department (HOSPITAL_COMMUNITY)
Admission: EM | Admit: 2010-12-29 | Discharge: 2010-12-29 | Payer: Self-pay | Source: Home / Self Care | Admitting: Emergency Medicine

## 2010-12-30 ENCOUNTER — Emergency Department (HOSPITAL_COMMUNITY)
Admission: EM | Admit: 2010-12-30 | Discharge: 2010-12-30 | Payer: Self-pay | Source: Home / Self Care | Admitting: Emergency Medicine

## 2010-12-31 ENCOUNTER — Emergency Department (HOSPITAL_COMMUNITY)
Admission: EM | Admit: 2010-12-31 | Discharge: 2010-12-31 | Payer: Self-pay | Source: Home / Self Care | Admitting: Emergency Medicine

## 2011-01-01 ENCOUNTER — Emergency Department (HOSPITAL_COMMUNITY)
Admission: EM | Admit: 2011-01-01 | Discharge: 2011-01-01 | Payer: Self-pay | Source: Home / Self Care | Admitting: Emergency Medicine

## 2011-01-02 ENCOUNTER — Inpatient Hospital Stay (HOSPITAL_COMMUNITY)
Admission: EM | Admit: 2011-01-02 | Discharge: 2011-01-06 | Payer: Self-pay | Source: Home / Self Care | Attending: Family Medicine | Admitting: Family Medicine

## 2011-01-03 LAB — HEPATIC FUNCTION PANEL
ALT: 14 U/L (ref 0–53)
AST: 19 U/L (ref 0–37)
Albumin: 3.6 g/dL (ref 3.5–5.2)
Alkaline Phosphatase: 83 U/L (ref 39–117)
Bilirubin, Direct: 0.1 mg/dL (ref 0.0–0.3)
Indirect Bilirubin: 0.5 mg/dL (ref 0.3–0.9)
Total Bilirubin: 0.6 mg/dL (ref 0.3–1.2)
Total Protein: 5.8 g/dL — ABNORMAL LOW (ref 6.0–8.3)

## 2011-01-03 LAB — CBC
HCT: 25.4 % — ABNORMAL LOW (ref 39.0–52.0)
Hemoglobin: 8.5 g/dL — ABNORMAL LOW (ref 13.0–17.0)
MCH: 29 pg (ref 26.0–34.0)
MCHC: 33.5 g/dL (ref 30.0–36.0)
MCV: 86.7 fL (ref 78.0–100.0)
Platelets: 172 10*3/uL (ref 150–400)
RBC: 2.93 MIL/uL — ABNORMAL LOW (ref 4.22–5.81)
RDW: 17.2 % — ABNORMAL HIGH (ref 11.5–15.5)
WBC: 2.6 10*3/uL — ABNORMAL LOW (ref 4.0–10.5)

## 2011-01-03 LAB — COMPREHENSIVE METABOLIC PANEL
ALT: 14 U/L (ref 0–53)
ALT: 12 U/L (ref 0–53)
AST: 20 U/L (ref 0–37)
AST: 15 U/L (ref 0–37)
Albumin: 3.6 g/dL (ref 3.5–5.2)
Albumin: 3.9 g/dL (ref 3.5–5.2)
Alkaline Phosphatase: 85 U/L (ref 39–117)
Alkaline Phosphatase: 97 U/L (ref 39–117)
BUN: 7 mg/dL (ref 6–23)
BUN: 24 mg/dL — ABNORMAL HIGH (ref 6–23)
CO2: 28 mEq/L (ref 19–32)
CO2: 19 mEq/L (ref 19–32)
Calcium: 9.7 mg/dL (ref 8.4–10.5)
Calcium: 9.8 mg/dL (ref 8.4–10.5)
Chloride: 96 mEq/L (ref 96–112)
Chloride: 98 mEq/L (ref 96–112)
Creatinine, Ser: 3.05 mg/dL — ABNORMAL HIGH (ref 0.4–1.5)
Creatinine, Ser: 5.65 mg/dL — ABNORMAL HIGH (ref 0.4–1.5)
GFR calc Af Amer: 28 mL/min — ABNORMAL LOW (ref >60)
GFR calc Af Amer: 14 mL/min — ABNORMAL LOW (ref >60)
GFR calc non Af Amer: 23 mL/min — ABNORMAL LOW (ref >60)
GFR calc non Af Amer: 11 mL/min — ABNORMAL LOW (ref >60)
Glucose, Bld: 90 mg/dL (ref 70–99)
Glucose, Bld: 142 mg/dL — ABNORMAL HIGH (ref 70–99)
Potassium: 4 mEq/L (ref 3.5–5.1)
Potassium: 4.5 mEq/L (ref 3.5–5.1)
Sodium: 135 mEq/L (ref 135–145)
Sodium: 130 mEq/L — ABNORMAL LOW (ref 135–145)
Total Bilirubin: 0.5 mg/dL (ref 0.3–1.2)
Total Bilirubin: 0.8 mg/dL (ref 0.3–1.2)
Total Protein: 6.4 g/dL (ref 6.0–8.3)
Total Protein: 6.8 g/dL (ref 6.0–8.3)

## 2011-01-03 LAB — URINE MICROSCOPIC-ADD ON

## 2011-01-03 LAB — POCT CARDIAC MARKERS
CKMB, poc: <1 ng/mL — ABNORMAL LOW (ref 1.0–8.0)
Myoglobin, poc: 401 ng/mL (ref 12–200)
Troponin i, poc: <0.05 ng/mL (ref 0.00–0.09)

## 2011-01-03 LAB — DIFFERENTIAL
Basophils Absolute: 0 10*3/uL (ref 0.0–0.1)
Basophils Relative: 0 % (ref 0–1)
Eosinophils Absolute: 0.1 10*3/uL (ref 0.0–0.7)
Eosinophils Relative: 5 % (ref 0–5)
Lymphocytes Relative: 24 % (ref 12–46)
Lymphs Abs: 0.6 10*3/uL — ABNORMAL LOW (ref 0.7–4.0)
Monocytes Absolute: 0.4 10*3/uL (ref 0.1–1.0)
Monocytes Relative: 17 % — ABNORMAL HIGH (ref 3–12)
Neutro Abs: 1.4 10*3/uL — ABNORMAL LOW (ref 1.7–7.7)
Neutrophils Relative %: 55 % (ref 43–77)

## 2011-01-03 LAB — URINALYSIS, ROUTINE W REFLEX MICROSCOPIC
Bilirubin Urine: NEGATIVE
Ketones, ur: NEGATIVE mg/dL
Leukocytes, UA: NEGATIVE
Nitrite: NEGATIVE
Protein, ur: 30 mg/dL — AB
Specific Gravity, Urine: 1.01 (ref 1.005–1.030)
Urine Glucose, Fasting: 100 mg/dL — AB
Urobilinogen, UA: 0.2 mg/dL (ref 0.0–1.0)
pH: 7 (ref 5.0–8.0)

## 2011-01-03 LAB — LIPASE, BLOOD: Lipase: 23 U/L (ref 11–59)

## 2011-01-05 LAB — DIFFERENTIAL
Basophils Absolute: 0 10*3/uL (ref 0.0–0.1)
Basophils Relative: 0 % (ref 0–1)
Eosinophils Absolute: 0.2 10*3/uL (ref 0.0–0.7)
Eosinophils Relative: 2 % (ref 0–5)
Lymphocytes Relative: 10 % — ABNORMAL LOW (ref 12–46)
Lymphs Abs: 1.1 10*3/uL (ref 0.7–4.0)
Monocytes Absolute: 0.4 10*3/uL (ref 0.1–1.0)
Monocytes Relative: 3 % (ref 3–12)
Neutro Abs: 8.9 10*3/uL — ABNORMAL HIGH (ref 1.7–7.7)
Neutrophils Relative %: 84 % — ABNORMAL HIGH (ref 43–77)

## 2011-01-05 LAB — BLOOD GAS, ARTERIAL
Acid-base deficit: 5.2 mmol/L — ABNORMAL HIGH (ref 0.0–2.0)
Bicarbonate: 20.6 mEq/L (ref 20.0–24.0)
Delivery systems: POSITIVE
Expiratory PAP: 5
FIO2: 1 %
Inspiratory PAP: 14
O2 Saturation: 97.6 %
RATE: 13 resp/min
pCO2 arterial: 46.4 mmHg — ABNORMAL HIGH (ref 35.0–45.0)
pH, Arterial: 7.269 — ABNORMAL LOW (ref 7.350–7.450)
pO2, Arterial: 102 mmHg — ABNORMAL HIGH (ref 80.0–100.0)

## 2011-01-05 LAB — CBC
HCT: 29.1 % — ABNORMAL LOW (ref 39.0–52.0)
Hemoglobin: 9.5 g/dL — ABNORMAL LOW (ref 13.0–17.0)
MCH: 28.6 pg (ref 26.0–34.0)
MCHC: 32.6 g/dL (ref 30.0–36.0)
MCV: 87.7 fL (ref 78.0–100.0)
Platelets: 217 10*3/uL (ref 150–400)
RBC: 3.32 MIL/uL — ABNORMAL LOW (ref 4.22–5.81)
RDW: 17.3 % — ABNORMAL HIGH (ref 11.5–15.5)
WBC: 10.6 10*3/uL — ABNORMAL HIGH (ref 4.0–10.5)

## 2011-01-05 LAB — BASIC METABOLIC PANEL
BUN: 17 mg/dL (ref 6–23)
CO2: 28 mEq/L (ref 19–32)
Calcium: 9.8 mg/dL (ref 8.4–10.5)
Chloride: 97 mEq/L (ref 96–112)
Creatinine, Ser: 5.21 mg/dL — ABNORMAL HIGH (ref 0.4–1.5)
GFR calc Af Amer: 15 mL/min — ABNORMAL LOW (ref >60)
GFR calc non Af Amer: 13 mL/min — ABNORMAL LOW (ref >60)
Glucose, Bld: 101 mg/dL — ABNORMAL HIGH (ref 70–99)
Potassium: 4.4 mEq/L (ref 3.5–5.1)
Sodium: 135 mEq/L (ref 135–145)

## 2011-01-05 LAB — BRAIN NATRIURETIC PEPTIDE: Pro B Natriuretic peptide (BNP): 2370 pg/mL — ABNORMAL HIGH (ref 0.0–100.0)

## 2011-01-05 LAB — MRSA PCR SCREENING: MRSA by PCR: NEGATIVE

## 2011-01-06 ENCOUNTER — Emergency Department (HOSPITAL_COMMUNITY)
Admission: EM | Admit: 2011-01-06 | Discharge: 2011-01-06 | Payer: Self-pay | Source: Home / Self Care | Admitting: Emergency Medicine

## 2011-01-07 ENCOUNTER — Emergency Department (HOSPITAL_COMMUNITY)
Admission: EM | Admit: 2011-01-07 | Discharge: 2011-01-07 | Payer: Self-pay | Source: Home / Self Care | Admitting: Emergency Medicine

## 2011-01-10 ENCOUNTER — Emergency Department (HOSPITAL_COMMUNITY)
Admission: EM | Admit: 2011-01-10 | Discharge: 2011-01-10 | Payer: Self-pay | Source: Home / Self Care | Admitting: Emergency Medicine

## 2011-01-10 LAB — BASIC METABOLIC PANEL
BUN: 15 mg/dL (ref 6–23)
CO2: 30 mEq/L (ref 19–32)
Chloride: 100 mEq/L (ref 96–112)
Creatinine, Ser: 4.36 mg/dL — ABNORMAL HIGH (ref 0.4–1.5)
GFR calc Af Amer: 19 mL/min — ABNORMAL LOW (ref >60)
Glucose, Bld: 91 mg/dL (ref 70–99)

## 2011-01-10 NOTE — Consult Note (Signed)
[name], [name]               ACCOUNT NO.:  [id number]  MEDICAL RECORD NO.:  [id number]          PATIENT TYPE:  INP  LOCATION:  [facility]03                          FACILITY:  [facility]  PHYSICIAN:  Jorja Loa, M.D.DATE OF BIRTH:  05/29/1976  DATE OF CONSULTATION: DATE OF DISCHARGE:                                CONSULTATION   REASON FOR CONSULT:  CHF and for hemodialysis.  This is one of multiple admission for [name] who has history of bipolar disorder, COPD, noncompliant with hemodialysis, and with his fluid and salt intake, presently came with the complaints of shortness of breath, swelling of his face, and also cough of about 2 days duration.  [name] went to dialysis on Saturday, but he cut his time and presently came next morning with the complaints of difficulty in breathing.  The patient has been doing multiple times in spite of recurrent advise.  The patient has been admitted to the hospital because of the same reason multiple times where he was dialyzed in home to come back with similar problem.  At this moment, patient as stated above complains of difficulty in breathing and also fascial puffiness, and swelling of the legs.  He denies any chest pain.  PAST MEDICAL HISTORY:  Problem #1.  He has recurrent history of CHF and also hyperkalemia because of noncompliance with his dialysis and fluid and salt intake. Problem #2.  History of end-stage renal disease on maintenance hemodialysis Tuesday, Thursday, and Saturday. Problem #3.  History of coronary artery disease status post stent placement. Problem #4.  History of hypertension. Problem #5.  History of chronic chest pain. Problem #6.  History of depression. Problem #7.  History of chronic pain problem. Problem #8.  History of COPD. Problem #9.  History of restless legs syndrome. Problem #10.  History of anemia. Problem #11.  History of bipolar disorder. Problem #12.  History of dyslipidemia. Problem #13.   History of recurrent hyperkalemia and hyponatremia.  SOCIAL HISTORY:  He has history of smoking, history of alcohol abuse, and also illicit drug use.  FAMILY HISTORY:  He has history of renal failure and coronary artery disease.  His medications consist of: 1. Xanax 0.5 mg p.o. t.i.d. 2. Norvasc 10 mg p.o. daily. 3. Aspirin 81 mg p.o. daily. 4. Catapres 0.1 mg t.i.d. 5. Plavix 75 mg p.o. daily. 6. Cymbalta 60 mg daily. 7. Hydralazine 50 mg p.o. t.i.d. 8. Normodyne 400 mg p.o. b.i.d. 9. Prinivil 20 mg p.o. b.i.d. 10.Zyprexa 10 mg p.o. b.i.d. 11.Protonix 40 mg p.o. daily. 12.__________ mg p.o. bedtime. 13.__________ mg p.o. daily.  ALLERGIES:  He is allergic to IBUPROFEN, NAPROSYN, LITHIUM, and also PHENYTOIN, METHADONE, ULTRAM, ZOCOR, and KETOROLAC.  REVIEW OF SYSTEMS:  Main complaint seems to be difficulty breathing, occasional cough, and some fascial swelling and also leg swelling.  He denies any nausea, vomiting.  Appetite overall is good.  PHYSICAL EXAMINATION:  VITAL SIGNS:  His blood pressure 163/92, pulse of 85, patient with severe facial swelling, his eyelids are all swelling, with closing eyes. NECK:  Supple.  He has JVD. CHEST:  He has bilateral decreased breath sounds, and  also some inspiratory crackles and also wheezing. HEART:  Reveals regular rate and rhythm.  No murmur.  No S3. ABDOMEN:  Soft.  Positive bowel sounds. EXTREMITIES:  He has 2+ edema.  His blood gas pH is 7.269, pCO2 of 46, pO2 of 102.  His white blood cell count is 16, hemoglobin 9.5, hematocrit 29.  His sodium is 130, potassium 4.5, BUN is 24, creatinine 5.6.  His BNP is 2370.  His chest x- ray, diffuse pulmonary opacity, right basilar airspace disease, partially clear, since he had study about 2 weeks ago.  ASSESSMENT:  Problem #1.  Congestive heart failure, recurrent problem mainly from missing his dialysis and cutting time and also uncontrolled salt and fluid intake. Problem #2.   History of end-stage renal disease.  He is partially dialyzed on Saturday, BUN and creatinine that was in acceptable range, normal potassium. Problem #3.  History of anemia.  He is on Epogen.  H and H is low. Problem #4.  History of hypertension, blood pressure seems to be somewhat high because he does not take his medication. Problem #5.  History of bipolar disorder. Problem #6.  History of depression. Problem #7.  History of coronary artery disease status post stent placement. Problem #8.  History of chronic back pain. Problem #9.  History of chronic obstructive pulmonary disease, still continues to smoke. Problem #10.  History of restless legs syndrome. Problem #11.  History of depression. Problem #12.  History of unstable angina.  RECOMMENDATIONS:  We will arrange for dialysis and dialyze him for 4 hours.  We will try to get about 4 liters if possible.  We will continue to encourage him to be compliant with his medication and dialysis.  We will continue his other medications.  We will follow the patient.     Jorja Loa, M.D.     BB/MEDQ  D:  01/03/2011  T:  01/03/2011  Job:  [id number]  Electronically Signed by Jorja Loa M.D. on 01/10/2011 11:25:57 AM

## 2011-01-12 ENCOUNTER — Emergency Department (HOSPITAL_COMMUNITY)
Admission: EM | Admit: 2011-01-12 | Discharge: 2011-01-12 | Payer: Self-pay | Source: Home / Self Care | Admitting: Emergency Medicine

## 2011-01-14 ENCOUNTER — Emergency Department (HOSPITAL_COMMUNITY)
Admission: EM | Admit: 2011-01-14 | Discharge: 2011-01-14 | Payer: Self-pay | Source: Home / Self Care | Admitting: Emergency Medicine

## 2011-01-18 NOTE — Letter (Signed)
Summary: Appointment -missed  [facility] [address]   [place], [place] [address]   Phone: [phone]  Fax: [phone]     December 21, 2009 MRN: [id number]      [name] [address] [place], [place]  [address]      Dear Mr. [place],  Our records indicate you missed your appointment on December 21, 2009                        with Dr. Antoine Poche.  Due to numerous no-shows with [place], we will not reschedule your appointment.   Sincerely,    Glass blower/designer

## 2011-01-18 NOTE — Letter (Signed)
Summary: ER REFERRAL  ER REFERRAL   Imported By: Diana Eves 10/14/2010 09:42:19  _____________________________________________________________________  External Attachment:    Type:   Image     Comment:   External Document

## 2011-01-18 NOTE — Letter (Signed)
Summary: needs egd in OR with RMR  Patient seen in hospital today. Needs outpatient EGD with RMR in OR for atypical chest pain, chronic gerd. OR due to polypharmacy, bipolar d/o, ?schizophrenia, chronic narcotic use. Patient tentatively scheduled for 7/19th at 7:30 as per discussion with [facility]. Patient to be contacted with instructions.  Appended Document: needs egd in OR with RMR Appt scheduled with Selena Batten in OR on Tuesday 7/19/11due to pt being M, W, F dialysis pt. and RMR not being back on a Thursday for rest of July.  Has a pre-op appt on 07/01/10 @ 1230pm.  Paperwork & RX mailed today

## 2011-01-18 NOTE — Consult Note (Signed)
Summary: CARDIOLOGY CONSULT/ [facility]  CARDIOLOGY CONSULT/ [facility]   Imported By: Zachary George 10/07/2010 10:47:43  _____________________________________________________________________  External Attachment:    Type:   Image     Comment:   External Document

## 2011-01-18 NOTE — Letter (Signed)
Summary: [facility] H&P/ D/C DR. Orvan Falconer  [facility] H&P/ D/C DR. Orvan Falconer   Imported By: Zachary George 10/07/2010 10:46:15  _____________________________________________________________________  External Attachment:    Type:   Image     Comment:   External Document

## 2011-01-18 NOTE — Progress Notes (Signed)
Summary: CHEST PAIN       New/Updated Medications: PROTONIX 40 MG TBEC (PANTOPRAZOLE SODIUM) 1 by mouth 30 minutes prior to breakfast and supper Prescriptions: PROTONIX 40 MG TBEC (PANTOPRAZOLE SODIUM) 1 by mouth 30 minutes prior to breakfast and supper  #60 x 5   Entered and Authorized by:   West Bali MD   Signed by:   West Bali MD on 06/16/2010   Method used:   Electronically to        [facility] (retail)       [address]       [place], [place]  [address]       Ph: [phone] or [phone]       Fax: [phone]   RxID:   [id number]  Pt admitted with chest pain. He was not taking Prevacid for GERD. EGD scheduled new onset dyspepsia JULY 19. Pt is on ASA and Plavix. Denies dysphagia, pain with swallowin, nausea, vomiting, diarrhea, or constipation. Pain most likely 2o to uncontrolled GERD. Protonix two times a day. West Bali MD  June 16, 2010 1:43 PM
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
## 2011-01-18 NOTE — Letter (Signed)
Summary: SURGICAL REFERRAL  SURGICAL REFERRAL   Imported By: Ave Filter 11/25/2010 09:51:07  _____________________________________________________________________  External Attachment:    Type:   Image     Comment:   External Document

## 2011-01-18 NOTE — Letter (Signed)
Summary: HIDA SCAN ORDER  HIDA SCAN ORDER   Imported By: Ave Filter 10/13/2010 12:22:26  _____________________________________________________________________  External Attachment:    Type:   Image     Comment:   External Document

## 2011-01-18 NOTE — Letter (Signed)
Summary: Appointment -missed  [facility] at [facility] [address]   [place], [place] [address]   Phone: [phone]  Fax: [phone]     October 07, 2010 MRN: [id number]     [name] [address] [place], [place]  [address]     Dear Mr. [facility],  Our records indicate you missed your appointment on October 07, 2010                        with Gene Serpe, [place].   It is very important that we reach you to reschedule this appointment. We look forward to participating in your health care needs.   Please contact us at the number listed above at your earliest convenience to reschedule this appointment.   Sincerely,    Glass blower/designer

## 2011-01-18 NOTE — Letter (Signed)
Summary: EGD in OR instructions/corrected  EGD in OR instructions   Imported By: Minna Merritts 06/23/2010 18:09:00  _____________________________________________________________________  External Attachment:    Type:   Image     Comment:   External Document  Appended Document: EGD in OR instructions/corrected pre op changed to 07/15 @ 3:15 per pts request- he is aware - cdg

## 2011-01-18 NOTE — Letter (Signed)
Summary: EGD in OR ORDER  EGD in OR ORDER   Imported By: Minna Merritts 06/17/2010 12:26:19  _____________________________________________________________________  External Attachment:    Type:   Image     Comment:   External Document  Appended Document: EGD in OR ORDER Patient was accidently sent instructions for TCS prep, but arrival times and pre-op were correct.  I called pt. to inform him of this mistake, told him to disgard prior instructions and that new ones would be arriving in the mail going out today.  Patient expressed understanding and told me to send instructions to [address], [place].

## 2011-01-18 NOTE — Letter (Signed)
Summary: ER REFFERAL  ER REFFERAL   Imported By: Diana Eves 10/14/2010 16:09:52  _____________________________________________________________________  External Attachment:    Type:   Image     Comment:   External Document

## 2011-01-19 NOTE — H&P (Signed)
NAME:  [name], [name]               ACCOUNT NO.:  [id number]  MEDICAL RECORD NO.:  [id number]          PATIENT TYPE:  INP  LOCATION:  IC03                          FACILITY:  [facility]  PHYSICIAN:  Melvyn Novas, MDDATE OF BIRTH:  12/18/1976  DATE OF ADMISSION:  01/02/2011 DATE OF DISCHARGE:  LH                             HISTORY & PHYSICAL   The patient is a 34-year white male with chronic renal insufficiency on dialysis, chronic noncompliance with medicines and dialysis appointments, history of accelerated hypertension, coronary artery disease with ischemic cardiomyopathy, ejection fraction of 35-40%, status post stenting of 3 coronary arteries.  Apparently, the patient became increasingly dyspneic over day 2, was seen in the office, had fairly decent hypertension control, so we can go after increasing his medicines and organizing them for him.  He denies anginal chest pain, has increasing dyspnea, and some accelerated hypertension, currently blood pressure 168/94, and confusion about his medicines.  Initial cardiac enzymes were negative.  His BNP is grossly elevated at 2300 and it looks like intravascular volume overload due to an accelerated hypertension due to chronic noncompliance with dialysis and medicines. We will look to dialyze him at his earliest convenience and we will reinstitute his antihypertensive regimen.  He will be seen in consultation by Dr. Kristian Covey.  PAST MEDICAL HISTORY:  Significant for the aforementioned systolic coronary ischemic cardiomyopathy, chronic renal failure, accelerated hypertension, hyperlipidemia, schizophrenia, chronic noncompliance.  He has no known allergies.  CURRENT MEDICATIONS: 1. Clonidine 0.1 mg p.o. b.i.d. 2. Labetalol 400 mg p.o. b.i.d. 3. Aspirin 325 p.o. daily. 4. Xanax 0.25 p.o. t.i.d. 5. Norvasc 10 mg p.o. daily. 6. Carvedilol 12.5 mg p.o. b.i.d. 7. Crestor 20 mg p.o. daily. 8. Cymbalta 60 mg p.o. daily. 9.  Hydralazine 50 mg. 10.Lisinopril 20 mg p.o. b.i.d. 11.Lorcet 7.5/650 p.o. t.i.d. 12.Nephro-Vite 1 tablet p.o. daily. 13.Plavix 75 mg p.o. daily. 14.Renvela 800 mg 2-3 tablets t.i.d. 15.ReQuip  2 mg p.o. at bedtime. 16.Trazodone 50 mg p.o. at bedtime. 17.Zyprexa 10 mg p.o. b.i.d.  PHYSICAL EXAMINATION:  VITAL SIGNS:  Blood pressure currently is 168/94, pulse is 84 and regular, he is afebrile, respiratory rate on BiPAP is 20. LUNGS:  Shows scattered rhonchi.  Diminished breath sounds at the bases. No rales appreciable.  No wheeze. HEART:  Regular rhythm.  No S3 gallop audible.  S4 positive.  No heaves, thrills, or rubs. ABDOMEN:  Obese, soft, and nontender.  Bowel sounds normoactive.  No guarding, rebound, masses, or hepatosplenomegaly. EXTREMITIES:  Trace to 1+ pedal edema. NEUROLOGIC:  The patient is alert and oriented.  Cranial nerves grossly intact.  The patient moves all 4 extremities.  IMPRESSION: 1. Accelerated hypertension. 2. Chronic renal failure. 3. Intravascular volume overload. 4. Ischemic cardiomyopathy, ejection fraction 35-40%. 5. Coronary artery disease status post stenting in 3 vessels. 6. Hyperlipidemia. 7. Schizophrenia. 8. Chronic noncompliance with dialysis and medicines.  He was to resume his antihypertensive medicines, get this under control. Continue serial cardiac enzymes.  Dialysis for volume depletion at earliest convenience.     Melvyn Novas, MD     RMD/MEDQ  D:  01/03/2011  T:  01/03/2011  Job:  [id number]  Electronically Signed by Oval Linsey MD on 01/19/2011 12:30:28 PM

## 2011-01-19 NOTE — Discharge Summary (Signed)
  NAME:  [name], [name] NO.:  [id number]  MEDICAL RECORD NO.:  [id number]          PATIENT TYPE:  INP  LOCATION:  A211                          FACILITY:  [facility]  PHYSICIAN:  Melvyn Novas, MDDATE OF BIRTH:  June 17, 1976  DATE OF ADMISSION:  01/02/2011 DATE OF DISCHARGE:  LH                              DISCHARGE SUMMARY   This was done in anticipation of his frequent usual behaviors, signing out AMA.  I have taken the liberty of ensuring he has his medicines.  I do not feel he should be discharged at this time.  We will consider this in another 24 hours.  The patient is known schizophrenic chronic noncompliance with hemodialysis and medicines were accelerated, hypertension, coronary artery disease, ischemic cardiomyopathy status post stenting x3 different coronary arteries with end-stage renal failure on dialysis, COPD, continued smoking, hyperlipidemic, schizophrenia, depression, bipolar disorder and anemia of chronic disease due to renal failure and recurrent hyperkalemia and hyponatremia.  The patient was admitted with generalized volume overload of lungs, accelerated hypertension.  Due to chronic noncompliance, he signs out after half an hour of dialysis, to find out from nephrologist.  He was placed in ICU, given diuresis, given urgent dialysis for volume reduction.  He had dialysis 2 days in a row. He seem to be better.  His systolic came down to the 150 range when placed back on his medicines.  Myocardial infarction was ruled out.  The patient seems somewhat lethargic in hospital.  Review of his sedating medicines, I have decided to eliminate Neurontin and Ambien that was somehow on the record,  not sure who he is getting it from.  The patient was cautioned at length for the second and third time about need for compliance with medicines to simplify them in the office for him.  I made them simple as possible and not certain the degree  of understanding.  The patient has a degree of willingness to comply.  Risk of death, MI, CVA, CHF was explained to the patient if he does not comply, that he seemed to understand.  DISCHARGE MEDICINES: 1. Trazodone 50 mg nightly. 2. Albuterol inhaler 2 puffs t.i.d. 3. Aspirin 325 p.o. daily. 4. Xanax 0.25 p.o. t.i.d. 5. Amlodipine 10 mg p.o. daily. 6. Carvedilol 12.5 p.o. b.i.d. 7. Clonidine 0.1 mg p.o. t.i.d. 8. Crestor 20 mg p.o. daily. 9. Cymbalta 60 mg p.o. daily. 10.Hydralazine 50 mg p.o. t.i.d. 11.Labetalol 400 mg p.o. b.i.d. 12.Nephro-Vite 1 tablet by mouth daily. 13.Plavix 75 mg p.o. daily. 14.Renvela. 15.Calcium carbonate 800 mg 2-3 tablets 3 times a day. 16.Ropinirole 2 mg p.o. nightly for restless leg syndrome. 17.Zyprexa 10 mg p.o. b.i.d.  The patient is instructed to follow up in the office in 2 days' time.     Melvyn Novas, MD     RMD/MEDQ  D:  01/05/2011  T:  01/05/2011  Job:  [id number]  Electronically Signed by Oval Linsey MD on 01/19/2011 12:30:20 PM
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
## 2011-01-21 ENCOUNTER — Emergency Department (HOSPITAL_COMMUNITY): Admit: 2011-01-21 | Discharge: 2011-01-21 | Disposition: A | Discharge status: Home / Self Care | Payer: Medicare Other

## 2011-01-21 ENCOUNTER — Emergency Department (HOSPITAL_COMMUNITY)
Admission: EM | Admit: 2011-01-21 | Discharge: 2011-01-21 | Disposition: A | Discharge status: Home / Self Care | Payer: Medicare Other | Attending: Emergency Medicine | Admitting: Emergency Medicine

## 2011-01-21 DIAGNOSIS — M25529 Pain in unspecified elbow: Secondary | ICD-10-CM | POA: Insufficient documentation

## 2011-01-21 DIAGNOSIS — Y92009 Unspecified place in unspecified non-institutional (private) residence as the place of occurrence of the external cause: Secondary | ICD-10-CM | POA: Insufficient documentation

## 2011-01-21 DIAGNOSIS — J4489 Other specified chronic obstructive pulmonary disease: Secondary | ICD-10-CM | POA: Insufficient documentation

## 2011-01-21 DIAGNOSIS — N186 End stage renal disease: Secondary | ICD-10-CM | POA: Insufficient documentation

## 2011-01-21 DIAGNOSIS — I252 Old myocardial infarction: Secondary | ICD-10-CM | POA: Insufficient documentation

## 2011-01-21 DIAGNOSIS — M25429 Effusion, unspecified elbow: Secondary | ICD-10-CM | POA: Insufficient documentation

## 2011-01-21 DIAGNOSIS — W1809XA Striking against other object with subsequent fall, initial encounter: Secondary | ICD-10-CM | POA: Insufficient documentation

## 2011-01-21 DIAGNOSIS — S5000XA Contusion of unspecified elbow, initial encounter: Secondary | ICD-10-CM | POA: Insufficient documentation

## 2011-01-21 DIAGNOSIS — E785 Hyperlipidemia, unspecified: Secondary | ICD-10-CM | POA: Insufficient documentation

## 2011-01-21 DIAGNOSIS — I120 Hypertensive chronic kidney disease with stage 5 chronic kidney disease or end stage renal disease: Secondary | ICD-10-CM | POA: Insufficient documentation

## 2011-01-21 DIAGNOSIS — Z992 Dependence on renal dialysis: Secondary | ICD-10-CM | POA: Insufficient documentation

## 2011-01-21 DIAGNOSIS — I509 Heart failure, unspecified: Secondary | ICD-10-CM | POA: Insufficient documentation

## 2011-01-21 DIAGNOSIS — Z79899 Other long term (current) drug therapy: Secondary | ICD-10-CM | POA: Insufficient documentation

## 2011-01-21 DIAGNOSIS — F341 Dysthymic disorder: Secondary | ICD-10-CM | POA: Insufficient documentation

## 2011-01-21 DIAGNOSIS — J449 Chronic obstructive pulmonary disease, unspecified: Secondary | ICD-10-CM | POA: Insufficient documentation

## 2011-01-21 DIAGNOSIS — K219 Gastro-esophageal reflux disease without esophagitis: Secondary | ICD-10-CM | POA: Insufficient documentation

## 2011-01-21 DIAGNOSIS — I2510 Atherosclerotic heart disease of native coronary artery without angina pectoris: Secondary | ICD-10-CM | POA: Insufficient documentation

## 2011-01-24 ENCOUNTER — Emergency Department (HOSPITAL_COMMUNITY): Payer: Medicare Other

## 2011-01-24 ENCOUNTER — Emergency Department (HOSPITAL_COMMUNITY)
Admission: EM | Admit: 2011-01-24 | Discharge: 2011-01-24 | Disposition: A | Discharge status: Home / Self Care | Payer: Medicare Other | Attending: Emergency Medicine | Admitting: Emergency Medicine

## 2011-01-24 DIAGNOSIS — T07XXXA Unspecified multiple injuries, initial encounter: Secondary | ICD-10-CM | POA: Insufficient documentation

## 2011-01-24 DIAGNOSIS — Z79899 Other long term (current) drug therapy: Secondary | ICD-10-CM | POA: Insufficient documentation

## 2011-01-24 DIAGNOSIS — IMO0001 Reserved for inherently not codable concepts without codable children: Secondary | ICD-10-CM | POA: Insufficient documentation

## 2011-01-24 DIAGNOSIS — R0602 Shortness of breath: Secondary | ICD-10-CM | POA: Insufficient documentation

## 2011-01-24 DIAGNOSIS — K219 Gastro-esophageal reflux disease without esophagitis: Secondary | ICD-10-CM | POA: Insufficient documentation

## 2011-01-24 DIAGNOSIS — R059 Cough, unspecified: Secondary | ICD-10-CM | POA: Insufficient documentation

## 2011-01-24 DIAGNOSIS — E785 Hyperlipidemia, unspecified: Secondary | ICD-10-CM | POA: Insufficient documentation

## 2011-01-24 DIAGNOSIS — Y93E1 Activity, personal bathing and showering: Secondary | ICD-10-CM | POA: Insufficient documentation

## 2011-01-24 DIAGNOSIS — S8000XA Contusion of unspecified knee, initial encounter: Secondary | ICD-10-CM | POA: Insufficient documentation

## 2011-01-24 DIAGNOSIS — Z992 Dependence on renal dialysis: Secondary | ICD-10-CM | POA: Insufficient documentation

## 2011-01-24 DIAGNOSIS — Z8659 Personal history of other mental and behavioral disorders: Secondary | ICD-10-CM | POA: Insufficient documentation

## 2011-01-24 DIAGNOSIS — J4489 Other specified chronic obstructive pulmonary disease: Secondary | ICD-10-CM | POA: Insufficient documentation

## 2011-01-24 DIAGNOSIS — J449 Chronic obstructive pulmonary disease, unspecified: Secondary | ICD-10-CM | POA: Insufficient documentation

## 2011-01-24 DIAGNOSIS — W010XXA Fall on same level from slipping, tripping and stumbling without subsequent striking against object, initial encounter: Secondary | ICD-10-CM | POA: Insufficient documentation

## 2011-01-24 DIAGNOSIS — I2510 Atherosclerotic heart disease of native coronary artery without angina pectoris: Secondary | ICD-10-CM | POA: Insufficient documentation

## 2011-01-24 DIAGNOSIS — R05 Cough: Secondary | ICD-10-CM | POA: Insufficient documentation

## 2011-01-24 DIAGNOSIS — R9431 Abnormal electrocardiogram [ECG] [EKG]: Secondary | ICD-10-CM | POA: Insufficient documentation

## 2011-01-24 DIAGNOSIS — R609 Edema, unspecified: Secondary | ICD-10-CM | POA: Insufficient documentation

## 2011-01-24 DIAGNOSIS — I10 Essential (primary) hypertension: Secondary | ICD-10-CM | POA: Insufficient documentation

## 2011-01-24 DIAGNOSIS — I120 Hypertensive chronic kidney disease with stage 5 chronic kidney disease or end stage renal disease: Secondary | ICD-10-CM | POA: Insufficient documentation

## 2011-01-24 DIAGNOSIS — F329 Major depressive disorder, single episode, unspecified: Secondary | ICD-10-CM | POA: Insufficient documentation

## 2011-01-24 DIAGNOSIS — I509 Heart failure, unspecified: Secondary | ICD-10-CM | POA: Insufficient documentation

## 2011-01-24 DIAGNOSIS — G8929 Other chronic pain: Secondary | ICD-10-CM | POA: Insufficient documentation

## 2011-01-24 DIAGNOSIS — Y92009 Unspecified place in unspecified non-institutional (private) residence as the place of occurrence of the external cause: Secondary | ICD-10-CM | POA: Insufficient documentation

## 2011-01-24 DIAGNOSIS — N186 End stage renal disease: Secondary | ICD-10-CM | POA: Insufficient documentation

## 2011-01-24 DIAGNOSIS — R0682 Tachypnea, not elsewhere classified: Secondary | ICD-10-CM | POA: Insufficient documentation

## 2011-01-24 DIAGNOSIS — F3289 Other specified depressive episodes: Secondary | ICD-10-CM | POA: Insufficient documentation

## 2011-01-24 LAB — POCT CARDIAC MARKERS: Myoglobin, poc: 324 ng/mL (ref 12–200)

## 2011-01-24 NOTE — Consult Note (Addendum)
[name], [name]               ACCOUNT NO.:  [id number]  MEDICAL RECORD NO.:  [id number]          PATIENT TYPE:  INP  LOCATION:  IC03                          FACILITY:  [facility]  PHYSICIAN:  Gerrit Friends. Dietrich Pates, MD, FACCDATE OF BIRTH:  1976/07/10  DATE OF CONSULTATION:  12/17/2010 DATE OF DISCHARGE:                                CONSULTATION   PRIMARY CARDIOLOGIST:  Jonelle Sidle, MD  PRIMARY CARE PHYSICIAN:  Dr. Lowell Guitar.  REASON FOR CONSULTATION:  CHF.  HISTORY OF PRESENT ILLNESS:  A 35 year old Caucasian male with multiple admission for CHF, end-stage renal disease, CAD, hypertensive urgency who was not followed up to outpatient cardiology appointments.  We have not seen him since last admissions October 2011, admitted with complaints of chest pain during dialysis and was found to be severely hypertensive with a blood pressure of 184/118 tachycardiac along with hyperkalemia, CHF was noted with a BNP of 3200 and pulmonary edema noted on chest x-ray.  He was treated with Kayexalate, Lasix, breathing treatment, insulin.  The patient denies any medical noncompliance and he has a very strong history of this and does not follow with primary care physician's or with dialysis regularly.  REVIEW OF SYSTEMS:  Positive for chest pain, shortness of breath, dyspnea on exertion.  All other systems reviewed and found to be negative.  CODE STATUS:  Full.  PAST MEDICAL HISTORY: 1. CAD.     a.     Non-ST elevated MI in January 2010 and September 2010,      requiring a bare-metal stent to the circumflex and right coronary      artery in January 2010. 2. Most recent echocardiogram dated October 2011, reveals an EF of 35-     40% with mild concentric hypertrophy. 3. End-stage renal disease. 4. Chronic diastolic CHF. 5. Hypertension. 6. Medical noncompliance. 7. Hypertensive heart disease. 8. Schizophrenia. 9. COPD.  SOCIAL HISTORY:  He lives in [place] with his mother.  He is  not married.  He is a 20 pack-year smoker.  Negative for EtOH.  He does have a history of illicit drug use by history.  He denies now.  FAMILY HISTORY:  Mother with CAD and father with CAD.  CURRENT MEDICATIONS PRIOR TO ADMISSION: 1. Requip 20 mg daily. 2. Plavix 75 mg daily. 3. Aspirin 81 mg daily. 4. Norvasc 10 mg daily. 5. Lisinopril 20 mg daily. 6. Cymbalta 60 mg daily. 7. Protonix 40 mg daily. 8. Labetalol 200 mg b.i.d. 9. Crestor 20 mg daily. 10.Hydralazine 50 mg t.i.d. 11.Xanax 0.5 mg t.i.d. p.r.n. 12.Hydrocodone 5/500 q.i.d. p.r.n.  ALLERGIES:  To FENTANYL, IBUPROFEN, LITHIUM, METHADONE, NAPROXEN, TORADOL, ULTRAM and ZOCOR.  CURRENT LABORATORY DATA:  Sodium 136, potassium 5.8, chloride 99, CO2 23, BUN 41, creatinine 10.5, glucose 96, hemoglobin 9.2, hematocrit 27.2, white blood cell 6.7, platelets 235.  BNP greater than 3200, D- dimer 0.53, troponin 0.05 and 0.09 respectively.  Negative for MRSA. EKG revealing sinus tachycardia with a rate of 116 beats per minute.  RADIOLOGY:  Chest x-ray bibasilar airspace density right greater than left on pneumonia versus edema.  Pulmonary vascular congestion and COPD.  PHYSICAL EXAMINATION:  VITAL SIGNS:  Blood pressure 194/124, pulse 113, respirations 25, temperature 97.8, O2 sats 97% on 2 L, dry weight 83 kg, current weight 93 kg. GENERAL:  He is anxious and dyspneic. HEENT:  Head is normocephalic and atraumatic.  His eyes are swollen and puffy with positive JVD.  No carotid bruits.  CARDIOVASCULAR: Tachycardic, distant heart sounds.  Pulses are 2+ and equal bilaterally. LUNGS:  Diminished bibasilar with inspiratory wheezes. ABDOMEN:  Soft, nontender with 2+ bowel sounds. EXTREMITIES:  With clubbing and edema and mild cyanosis around the lips with a bluish tint noted with rapid respirations.  MUSCULOSKELETAL:  No joint deformity. NEURO:  Cranial nerves II through XII are grossly intact.  IMPRESSION: 1. Acute on chronic  systolic congestive heart failure with a history     of hypertensive heart disease, questionable noncompliance with meds     and dialysis, although he denies.  Otherwise, very little diureses     from IV Lasix in the ER.  He is planning to have the dialysis     emergently. 2. End-stage renal disease.  Dialysis is being set up at the time of     this evaluation for emergent dialysis. 3. Hypertension not controlled and at present secondary to fluid     overload.  We will restart meds as he is to take at home.  The patient has recurrent chest pain, but patent coronaries on repeat after small non-Q-wave MI, two-vessel PCI in January 2010.  No subsequent MI stents with frequent episodes of CHF secondary to noncompliance and with medical care, would not unless it rules in for MI or as unequivocal evidence for ischemia.  Okay for discontinue if he does rule out.  We will try to follow up in the office, but he rarely comes to his appointments.  On behalf of the physicians and providers of [facility], we would like to thank Dr. Lowell Guitar for allowing [place] to participate in the care of this patient.     Bettey Mare. Lyman Bishop, NP   ______________________________ Gerrit Friends. Dietrich Pates, MD, [facility]    KML/MEDQ  D:  12/17/2010  T:  12/18/2010  Job:  [id number]  cc:   Dr. Lowell Guitar  Electronically Signed by Joni Reining NP on 12/21/2010 04:22:32 PM Electronically Signed by Seffner Bing MD [facility] on 01/24/2011 08:13:41 AM

## 2011-01-26 ENCOUNTER — Emergency Department (HOSPITAL_COMMUNITY): Payer: Medicare Other

## 2011-01-26 ENCOUNTER — Emergency Department (HOSPITAL_COMMUNITY)
Admission: EM | Admit: 2011-01-26 | Discharge: 2011-01-26 | Disposition: A | Discharge status: Home / Self Care | Payer: Medicare Other | Attending: Emergency Medicine | Admitting: Emergency Medicine

## 2011-01-26 ENCOUNTER — Inpatient Hospital Stay (HOSPITAL_COMMUNITY)
Admission: EM | Admit: 2011-01-26 | Discharge: 2011-01-28 | DRG: 291 | Discharge status: Against Medical Advice | Payer: Medicare Other | Attending: Family Medicine | Admitting: Family Medicine

## 2011-01-26 DIAGNOSIS — N186 End stage renal disease: Secondary | ICD-10-CM | POA: Insufficient documentation

## 2011-01-26 DIAGNOSIS — I509 Heart failure, unspecified: Secondary | ICD-10-CM | POA: Insufficient documentation

## 2011-01-26 DIAGNOSIS — J4489 Other specified chronic obstructive pulmonary disease: Secondary | ICD-10-CM | POA: Diagnosis present

## 2011-01-26 DIAGNOSIS — Z9119 Patient's noncompliance with other medical treatment and regimen: Secondary | ICD-10-CM

## 2011-01-26 DIAGNOSIS — M25473 Effusion, unspecified ankle: Secondary | ICD-10-CM | POA: Insufficient documentation

## 2011-01-26 DIAGNOSIS — Z9861 Coronary angioplasty status: Secondary | ICD-10-CM

## 2011-01-26 DIAGNOSIS — M25476 Effusion, unspecified foot: Secondary | ICD-10-CM | POA: Insufficient documentation

## 2011-01-26 DIAGNOSIS — E785 Hyperlipidemia, unspecified: Secondary | ICD-10-CM | POA: Insufficient documentation

## 2011-01-26 DIAGNOSIS — M25579 Pain in unspecified ankle and joints of unspecified foot: Secondary | ICD-10-CM | POA: Insufficient documentation

## 2011-01-26 DIAGNOSIS — J449 Chronic obstructive pulmonary disease, unspecified: Secondary | ICD-10-CM | POA: Diagnosis present

## 2011-01-26 DIAGNOSIS — Z992 Dependence on renal dialysis: Secondary | ICD-10-CM | POA: Insufficient documentation

## 2011-01-26 DIAGNOSIS — F341 Dysthymic disorder: Secondary | ICD-10-CM | POA: Insufficient documentation

## 2011-01-26 DIAGNOSIS — I2510 Atherosclerotic heart disease of native coronary artery without angina pectoris: Secondary | ICD-10-CM | POA: Diagnosis present

## 2011-01-26 DIAGNOSIS — F17200 Nicotine dependence, unspecified, uncomplicated: Secondary | ICD-10-CM | POA: Diagnosis present

## 2011-01-26 DIAGNOSIS — F209 Schizophrenia, unspecified: Secondary | ICD-10-CM | POA: Diagnosis present

## 2011-01-26 DIAGNOSIS — M7989 Other specified soft tissue disorders: Secondary | ICD-10-CM | POA: Insufficient documentation

## 2011-01-26 DIAGNOSIS — I120 Hypertensive chronic kidney disease with stage 5 chronic kidney disease or end stage renal disease: Secondary | ICD-10-CM | POA: Insufficient documentation

## 2011-01-26 DIAGNOSIS — Y93E1 Activity, personal bathing and showering: Secondary | ICD-10-CM | POA: Insufficient documentation

## 2011-01-26 DIAGNOSIS — Z91199 Patient's noncompliance with other medical treatment and regimen due to unspecified reason: Secondary | ICD-10-CM

## 2011-01-26 DIAGNOSIS — W010XXA Fall on same level from slipping, tripping and stumbling without subsequent striking against object, initial encounter: Secondary | ICD-10-CM | POA: Insufficient documentation

## 2011-01-26 DIAGNOSIS — M79609 Pain in unspecified limb: Secondary | ICD-10-CM | POA: Insufficient documentation

## 2011-01-26 DIAGNOSIS — S93609A Unspecified sprain of unspecified foot, initial encounter: Secondary | ICD-10-CM | POA: Insufficient documentation

## 2011-01-26 DIAGNOSIS — I252 Old myocardial infarction: Secondary | ICD-10-CM

## 2011-01-26 DIAGNOSIS — Y929 Unspecified place or not applicable: Secondary | ICD-10-CM | POA: Insufficient documentation

## 2011-01-26 DIAGNOSIS — I5033 Acute on chronic diastolic (congestive) heart failure: Secondary | ICD-10-CM | POA: Diagnosis present

## 2011-01-26 DIAGNOSIS — K219 Gastro-esophageal reflux disease without esophagitis: Secondary | ICD-10-CM | POA: Diagnosis present

## 2011-01-26 DIAGNOSIS — IMO0001 Reserved for inherently not codable concepts without codable children: Principal | ICD-10-CM | POA: Diagnosis present

## 2011-01-26 LAB — DIFFERENTIAL
Basophils Absolute: 0 10*3/uL (ref 0.0–0.1)
Basophils Relative: 0 % (ref 0–1)
Eosinophils Absolute: 0.2 10*3/uL (ref 0.0–0.7)
Monocytes Absolute: 0.4 10*3/uL (ref 0.1–1.0)
Neutro Abs: 5.2 10*3/uL (ref 1.7–7.7)
Neutrophils Relative %: 71 % (ref 43–77)

## 2011-01-26 LAB — BLOOD GAS, ARTERIAL
Acid-Base Excess: 2 mmol/L (ref 0.0–2.0)
Bicarbonate: 26.7 mEq/L — ABNORMAL HIGH (ref 20.0–24.0)
O2 Saturation: 95.7 %
pO2, Arterial: 74.6 mmHg — ABNORMAL LOW (ref 80.0–100.0)

## 2011-01-26 LAB — BASIC METABOLIC PANEL
CO2: 27 mEq/L (ref 19–32)
Calcium: 9.8 mg/dL (ref 8.4–10.5)
Creatinine, Ser: 5.46 mg/dL — ABNORMAL HIGH (ref 0.4–1.5)
GFR calc Af Amer: 14 mL/min — ABNORMAL LOW (ref >60)
GFR calc non Af Amer: 12 mL/min — ABNORMAL LOW (ref >60)
Sodium: 134 mEq/L — ABNORMAL LOW (ref 135–145)

## 2011-01-26 LAB — POCT CARDIAC MARKERS
CKMB, poc: 1.1 ng/mL (ref 1.0–8.0)
Myoglobin, poc: 352 ng/mL (ref 12–200)

## 2011-01-26 LAB — BRAIN NATRIURETIC PEPTIDE: Pro B Natriuretic peptide (BNP): 2070 pg/mL — ABNORMAL HIGH (ref 0.0–100.0)

## 2011-01-26 LAB — CBC
Hemoglobin: 9.4 g/dL — ABNORMAL LOW (ref 13.0–17.0)
MCHC: 34.3 g/dL (ref 30.0–36.0)
Platelets: 232 10*3/uL (ref 150–400)
RDW: 16.6 % — ABNORMAL HIGH (ref 11.5–15.5)

## 2011-01-27 ENCOUNTER — Inpatient Hospital Stay (HOSPITAL_COMMUNITY): Payer: Medicare Other

## 2011-01-27 LAB — CARDIAC PANEL(CRET KIN+CKTOT+MB+TROPI)
CK, MB: 1.4 ng/mL (ref 0.3–4.0)
Relative Index: INVALID (ref 0.0–2.5)
Relative Index: INVALID (ref 0.0–2.5)
Total CK: 64 U/L (ref 7–232)
Troponin I: 0.04 ng/mL (ref 0.00–0.06)
Troponin I: 0.04 ng/mL (ref 0.00–0.06)

## 2011-01-27 LAB — GLUCOSE, CAPILLARY: Glucose-Capillary: 85 mg/dL (ref 70–99)

## 2011-01-28 LAB — BASIC METABOLIC PANEL
CO2: 27 mEq/L (ref 19–32)
Calcium: 9.2 mg/dL (ref 8.4–10.5)
Chloride: 96 mEq/L (ref 96–112)
Creatinine, Ser: 5.02 mg/dL — ABNORMAL HIGH (ref 0.4–1.5)
Glucose, Bld: 112 mg/dL — ABNORMAL HIGH (ref 70–99)

## 2011-01-30 ENCOUNTER — Emergency Department (HOSPITAL_COMMUNITY): Payer: Medicare Other

## 2011-01-30 ENCOUNTER — Inpatient Hospital Stay (HOSPITAL_COMMUNITY)
Admission: EM | Admit: 2011-01-30 | Discharge: 2011-02-05 | DRG: 291 | Disposition: A | Discharge status: Home / Self Care | Payer: Medicare Other | Attending: Family Medicine | Admitting: Family Medicine

## 2011-01-30 DIAGNOSIS — I2510 Atherosclerotic heart disease of native coronary artery without angina pectoris: Secondary | ICD-10-CM | POA: Diagnosis present

## 2011-01-30 DIAGNOSIS — Z9861 Coronary angioplasty status: Secondary | ICD-10-CM

## 2011-01-30 DIAGNOSIS — Z9119 Patient's noncompliance with other medical treatment and regimen: Secondary | ICD-10-CM

## 2011-01-30 DIAGNOSIS — Z91199 Patient's noncompliance with other medical treatment and regimen due to unspecified reason: Secondary | ICD-10-CM

## 2011-01-30 DIAGNOSIS — J449 Chronic obstructive pulmonary disease, unspecified: Secondary | ICD-10-CM | POA: Diagnosis present

## 2011-01-30 DIAGNOSIS — Z992 Dependence on renal dialysis: Secondary | ICD-10-CM

## 2011-01-30 DIAGNOSIS — I509 Heart failure, unspecified: Secondary | ICD-10-CM | POA: Diagnosis present

## 2011-01-30 DIAGNOSIS — F319 Bipolar disorder, unspecified: Secondary | ICD-10-CM | POA: Diagnosis present

## 2011-01-30 DIAGNOSIS — I120 Hypertensive chronic kidney disease with stage 5 chronic kidney disease or end stage renal disease: Secondary | ICD-10-CM | POA: Diagnosis present

## 2011-01-30 DIAGNOSIS — N186 End stage renal disease: Secondary | ICD-10-CM | POA: Diagnosis present

## 2011-01-30 DIAGNOSIS — J189 Pneumonia, unspecified organism: Secondary | ICD-10-CM | POA: Diagnosis present

## 2011-01-30 DIAGNOSIS — J4489 Other specified chronic obstructive pulmonary disease: Secondary | ICD-10-CM | POA: Diagnosis present

## 2011-01-30 DIAGNOSIS — I5023 Acute on chronic systolic (congestive) heart failure: Principal | ICD-10-CM | POA: Diagnosis present

## 2011-01-31 ENCOUNTER — Inpatient Hospital Stay (HOSPITAL_COMMUNITY): Payer: Medicare Other

## 2011-01-31 LAB — COMPREHENSIVE METABOLIC PANEL
ALT: 10 U/L (ref 0–53)
AST: 16 U/L (ref 0–37)
Albumin: 3.8 g/dL (ref 3.5–5.2)
Alkaline Phosphatase: 81 U/L (ref 39–117)
CO2: 22 mEq/L (ref 19–32)
Chloride: 95 mEq/L — ABNORMAL LOW (ref 96–112)
GFR calc Af Amer: 13 mL/min — ABNORMAL LOW (ref >60)
GFR calc non Af Amer: 11 mL/min — ABNORMAL LOW (ref >60)
Potassium: 4 mEq/L (ref 3.5–5.1)
Total Bilirubin: 0.8 mg/dL (ref 0.3–1.2)

## 2011-01-31 LAB — BLOOD GAS, ARTERIAL
Acid-base deficit: 1.1 mmol/L (ref 0.0–2.0)
Bicarbonate: 23.4 mEq/L (ref 20.0–24.0)
Delivery systems: POSITIVE
Expiratory PAP: 8
pCO2 arterial: 40.5 mmHg (ref 35.0–45.0)
pH, Arterial: 7.379 (ref 7.350–7.450)

## 2011-01-31 LAB — MRSA PCR SCREENING: MRSA by PCR: NEGATIVE

## 2011-01-31 LAB — BASIC METABOLIC PANEL
BUN: 15 mg/dL (ref 6–23)
Chloride: 100 mEq/L (ref 96–112)
Creatinine, Ser: 4.07 mg/dL — ABNORMAL HIGH (ref 0.4–1.5)
Glucose, Bld: 89 mg/dL (ref 70–99)

## 2011-01-31 LAB — DIFFERENTIAL
Basophils Relative: 0 % (ref 0–1)
Eosinophils Absolute: 0.2 10*3/uL (ref 0.0–0.7)
Lymphs Abs: 0.9 10*3/uL (ref 0.7–4.0)
Monocytes Relative: 5 % (ref 3–12)
Neutro Abs: 6.8 10*3/uL (ref 1.7–7.7)
Neutrophils Relative %: 82 % — ABNORMAL HIGH (ref 43–77)

## 2011-01-31 LAB — CBC
Hemoglobin: 9.3 g/dL — ABNORMAL LOW (ref 13.0–17.0)
Platelets: 228 10*3/uL (ref 150–400)
RBC: 3.14 MIL/uL — ABNORMAL LOW (ref 4.22–5.81)
WBC: 8.3 10*3/uL (ref 4.0–10.5)

## 2011-01-31 LAB — MAGNESIUM: Magnesium: 1.7 mg/dL (ref 1.5–2.5)

## 2011-01-31 LAB — PROCALCITONIN: Procalcitonin: 0.39 ng/mL

## 2011-02-01 DIAGNOSIS — R002 Palpitations: Secondary | ICD-10-CM

## 2011-02-01 DIAGNOSIS — I472 Ventricular tachycardia: Secondary | ICD-10-CM

## 2011-02-01 LAB — BASIC METABOLIC PANEL
BUN: 25 mg/dL — ABNORMAL HIGH (ref 6–23)
CO2: 27 mEq/L (ref 19–32)
Calcium: 9.4 mg/dL (ref 8.4–10.5)
Creatinine, Ser: 5.74 mg/dL — ABNORMAL HIGH (ref 0.4–1.5)
GFR calc non Af Amer: 11 mL/min — ABNORMAL LOW (ref >60)
Glucose, Bld: 96 mg/dL (ref 70–99)
Sodium: 138 mEq/L (ref 135–145)

## 2011-02-01 LAB — DIFFERENTIAL
Basophils Absolute: 0 10*3/uL (ref 0.0–0.1)
Eosinophils Absolute: 0.2 10*3/uL (ref 0.0–0.7)
Eosinophils Relative: 4 % (ref 0–5)
Lymphocytes Relative: 22 % (ref 12–46)
Lymphs Abs: 1.1 10*3/uL (ref 0.7–4.0)
Monocytes Absolute: 0.4 10*3/uL (ref 0.1–1.0)
Monocytes Relative: 8 % (ref 3–12)

## 2011-02-01 LAB — CBC
HCT: 27.3 % — ABNORMAL LOW (ref 39.0–52.0)
MCH: 28.9 pg (ref 26.0–34.0)
MCHC: 32.2 g/dL (ref 30.0–36.0)
MCV: 89.5 fL (ref 78.0–100.0)
Platelets: 195 10*3/uL (ref 150–400)
RDW: 16.6 % — ABNORMAL HIGH (ref 11.5–15.5)
WBC: 4.9 10*3/uL (ref 4.0–10.5)

## 2011-02-02 ENCOUNTER — Inpatient Hospital Stay (HOSPITAL_COMMUNITY): Payer: Medicare Other

## 2011-02-02 DIAGNOSIS — I059 Rheumatic mitral valve disease, unspecified: Secondary | ICD-10-CM

## 2011-02-02 DIAGNOSIS — I5023 Acute on chronic systolic (congestive) heart failure: Secondary | ICD-10-CM

## 2011-02-02 LAB — CBC
HCT: 26.1 % — ABNORMAL LOW (ref 39.0–52.0)
MCHC: 32.2 g/dL (ref 30.0–36.0)
Platelets: 218 10*3/uL (ref 150–400)
RDW: 16.8 % — ABNORMAL HIGH (ref 11.5–15.5)
WBC: 6.6 10*3/uL (ref 4.0–10.5)

## 2011-02-02 LAB — BASIC METABOLIC PANEL
Calcium: 9.8 mg/dL (ref 8.4–10.5)
GFR calc non Af Amer: 9 mL/min — ABNORMAL LOW (ref >60)
Glucose, Bld: 91 mg/dL (ref 70–99)
Potassium: 4.3 mEq/L (ref 3.5–5.1)
Sodium: 133 mEq/L — ABNORMAL LOW (ref 135–145)

## 2011-02-03 ENCOUNTER — Inpatient Hospital Stay (HOSPITAL_COMMUNITY): Payer: Medicare Other

## 2011-02-03 LAB — DIFFERENTIAL
Basophils Relative: 0 % (ref 0–1)
Eosinophils Absolute: 0.3 10*3/uL (ref 0.0–0.7)
Neutro Abs: 2.6 10*3/uL (ref 1.7–7.7)
Neutrophils Relative %: 58 % (ref 43–77)

## 2011-02-03 LAB — CBC
Hemoglobin: 9.1 g/dL — ABNORMAL LOW (ref 13.0–17.0)
Platelets: 240 10*3/uL (ref 150–400)
RBC: 3.18 MIL/uL — ABNORMAL LOW (ref 4.22–5.81)
WBC: 4.5 10*3/uL (ref 4.0–10.5)

## 2011-02-03 LAB — BASIC METABOLIC PANEL
BUN: 20 mg/dL (ref 6–23)
Chloride: 101 mEq/L (ref 96–112)
Creatinine, Ser: 5.03 mg/dL — ABNORMAL HIGH (ref 0.4–1.5)
GFR calc Af Amer: 16 mL/min — ABNORMAL LOW (ref >60)
GFR calc non Af Amer: 13 mL/min — ABNORMAL LOW (ref >60)
Potassium: 4.3 mEq/L (ref 3.5–5.1)

## 2011-02-03 NOTE — Discharge Summary (Signed)
  NAME:  [name], [name] NO.:  [id number]  MEDICAL RECORD NO.:  [id number]           PATIENT TYPE:  LOCATION:                                 FACILITY:  PHYSICIAN:  Melvyn Novas, MDDATE OF BIRTH:  10-12-1976  DATE OF ADMISSION: DATE OF DISCHARGE:  LH                              DISCHARGE SUMMARY   ADDENDUM:  The patient is also discharged on the following 2 medicines; 1. Gabapentin 300 mg p.o. t.i.d. 2. Addition of lisinopril 20 mg p.o. daily.     Melvyn Novas, MD     RMD/MEDQ  D:  01/20/2011  T:  01/21/2011  Job:  [id number]  Electronically Signed by Oval Linsey MD on 02/03/2011 03:36:25 PM

## 2011-02-03 NOTE — Discharge Summary (Signed)
  NAME:  ELIJIO, STAPLES NO.:  [id number]  MEDICAL RECORD NO.:  [id number]           PATIENT TYPE:  LOCATION:                                 FACILITY:  PHYSICIAN:  Melvyn Novas, MDDATE OF BIRTH:  07-18-1976  DATE OF ADMISSION: DATE OF DISCHARGE:  LH                              DISCHARGE SUMMARY   The patient is a 35 year old white male with multiple extensive medical problems including: 1. Noncompliance. 2. Chronic renal insufficiency, on dialysis. 3. Hypertension. 4. Ischemic cardiomyopathy with diminished ejection fraction. 5. Accelerated hypertension. 6. Ejection fraction 35-40%, 7. Stenting of 3 coronary arteries. 8. Bipolar disorder. 9. Schizoaffective disorder. 10.History of polysubstance abuse. 11.COPD with continued smoking despite counseling. 12.Recurrent congestive heart failure due to accelerated hypertension     and impaired systolic function.  The patient was admitted with congestive heart failure.  Blood pressure in the 200 systolic range, brief placed on all of his antihypertensive medicines, given urgent dialysis for correction of intravascular volume status and continued to improve over 2-3 days.  The patient was supposed to have his third consecutive day of dialysis.  He was ruled out for myocardial infarction.  He was hemodynamically more improved with systolic in the 150 range and we were waiting for his full blood pressure effect of all his various medicines to have bring his systolic down further.  The patient currently signed out AMA on Friday night and was scheduled to have dialysis the next morning.  DISCHARGE MEDICINES:  Essentially same as the previous discharge approximately 1 week ago.  They included: 1. Aspirin 325 p.o. daily. 2. Xanax 0.25 mg p.o. t.i.d. p.r.n. 3. Norvasc 10 mg p.o. daily. 4. Carvedilol 12.5 mg p.o. b.i.d. 5. Clonidine 0.1 mg p.o. t.i.d. 6. Crestor 20 mg p.o. daily. 7. Cymbalta 60 mg  p.o. daily. 8. Hydralazine 50 mg p.o. t.i.d. 9. Labetalol 400 mg p.o. b.i.d. 10.Nephro-Vite 1 tablet p.o. daily. 11.Plavix 75 mg p.o. daily. 12.Renvela unknown dosage scheduled. 13.Calcium carbonate 800 mg 2 tablets p.o. t.i.d. 14.Ropinirole 2 mg p.o. at bedtime. 15.Zyprexa 10 mg p.o. b.i.d. 16.Albuterol inhaler. 17.Gabapentin 300 mg p.o. t.i.d. 18.Lisinopril 20 mg p.o. daily.  The patient will hopefully follow up in dialysis which is scheduled for Saturday and I believe Tuesdays.     Melvyn Novas, MD     RMD/MEDQ  D:  01/31/2011  T:  02/01/2011  Job:  [id number]  Electronically Signed by Oval Linsey MD on 02/03/2011 03:36:27 PM

## 2011-02-04 ENCOUNTER — Inpatient Hospital Stay (HOSPITAL_COMMUNITY): Payer: Medicare Other

## 2011-02-04 DIAGNOSIS — I5023 Acute on chronic systolic (congestive) heart failure: Secondary | ICD-10-CM

## 2011-02-04 LAB — DIFFERENTIAL
Eosinophils Absolute: 0.3 10*3/uL (ref 0.0–0.7)
Lymphocytes Relative: 23 % (ref 12–46)
Lymphs Abs: 2 10*3/uL (ref 0.7–4.0)
Neutro Abs: 5.8 10*3/uL (ref 1.7–7.7)
Neutrophils Relative %: 65 % (ref 43–77)

## 2011-02-04 LAB — BASIC METABOLIC PANEL
CO2: 26 mEq/L (ref 19–32)
Chloride: 92 mEq/L — ABNORMAL LOW (ref 96–112)
GFR calc Af Amer: 11 mL/min — ABNORMAL LOW (ref >60)
Potassium: 4.9 mEq/L (ref 3.5–5.1)
Sodium: 132 mEq/L — ABNORMAL LOW (ref 135–145)

## 2011-02-04 LAB — CBC
HCT: 28.7 % — ABNORMAL LOW (ref 39.0–52.0)
Hemoglobin: 9.4 g/dL — ABNORMAL LOW (ref 13.0–17.0)
MCV: 87.8 fL (ref 78.0–100.0)
Platelets: 278 10*3/uL (ref 150–400)
RBC: 3.27 MIL/uL — ABNORMAL LOW (ref 4.22–5.81)
WBC: 8.8 10*3/uL (ref 4.0–10.5)

## 2011-02-04 LAB — VANCOMYCIN, TROUGH: Vancomycin Tr: 15.2 ug/mL (ref 10.0–20.0)

## 2011-02-05 ENCOUNTER — Inpatient Hospital Stay (HOSPITAL_COMMUNITY): Payer: Medicare Other

## 2011-02-05 LAB — CULTURE, BLOOD (ROUTINE X 2): Culture: NO GROWTH

## 2011-02-05 NOTE — H&P (Signed)
  NAMEROCCO, KERKHOFF               ACCOUNT NO.:  [id number]  MEDICAL RECORD NO.:  [id number]           PATIENT TYPE:  I  LOCATION:  A212                          FACILITY:  [facility]  PHYSICIAN:  Mila Homer. Sudie Bailey, M.D.DATE OF BIRTH:  05/02/1976  DATE OF ADMISSION:  01/26/2011 DATE OF DISCHARGE:  02/10/2012LH                             HISTORY & PHYSICAL   This 35 year old presented to the emergency room tonight with shortness of breath.  He was recently discharged from this hospital after a several day hospitalization.  He is chronically ill.  He has multiple medical problems which include end-stage renal disease currently on hemodialysis, COPD, coronary artery disease as well as bipolar disorder. He has been a cocaine user in the past.  He is admitted to the hospital frequently for medical problems related to his dialysis and possibly also noncompliance with medication.  EXAMINATION:  GENERAL:  A 35 year old man who is on BiPAP at the time I examined him. VITAL SIGNS:  Blood pressure was 196/104 initially.  O2 saturation was 86% when he came in. HEART/LUNGS:  Regular rhythm, rate of about 100.  Heart sounds were faint as were his lung sounds. ABDOMEN:  Soft without tenderness. EXTREMITIES:  There is at least 2+ edema of the distal legs.  At the time of my exam the patient's blood work was not back but his chest x-ray showed extensive infiltrates with air bronchograms.  This is compared to his prior chest x-ray done recently.  ADMISSION DIAGNOSES: 1. Possible fluid overload (the patient says his usual weight is 70 kg     where it is now 96 kg). 2. Possible sepsis. 3. Congestive heart failure. 4. Questionable pneumonia. 5. End-stage renal disease now on hemodialysis. 6. Chronic obstructive pulmonary disease. 7. Tobacco use disorder. 8. History of cocaine abuse.  PLAN:  I have discussed this case with the emergency room physician, Dr. Colon Branch.  Blood tests are pending  which include a CBC, BMP, BNP, lactic acid and blood gases.  Will be treating him tonight with BiPAP and O2, with albuterol and ipratropium neb treatments.  He will also require vancomycin and Zosyn given his frequent trips to the hospital and a question of the cause of his pneumonia, which Radiology says may be multifocal pneumonia. Tomorrow he will be seen by his local medical doctor, Dr. Felecia Shelling and I am also putting in a consult with Dr. Fausto Skillern, his nephrologist.     Mila Homer. Sudie Bailey, M.D.     SDK/MEDQ  D:  01/31/2011  T:  01/31/2011  Job:  [id number]  Electronically Signed by John Giovanni M.D. on 02/05/2011 05:12:18 AM

## 2011-02-07 ENCOUNTER — Emergency Department (HOSPITAL_COMMUNITY)
Admission: EM | Admit: 2011-02-07 | Discharge: 2011-02-07 | Disposition: A | Discharge status: Home / Self Care | Payer: Medicare Other | Attending: Emergency Medicine | Admitting: Emergency Medicine

## 2011-02-07 ENCOUNTER — Emergency Department (HOSPITAL_COMMUNITY): Payer: Medicare Other

## 2011-02-07 DIAGNOSIS — I10 Essential (primary) hypertension: Secondary | ICD-10-CM | POA: Insufficient documentation

## 2011-02-07 DIAGNOSIS — R109 Unspecified abdominal pain: Secondary | ICD-10-CM | POA: Insufficient documentation

## 2011-02-07 DIAGNOSIS — R112 Nausea with vomiting, unspecified: Secondary | ICD-10-CM | POA: Insufficient documentation

## 2011-02-07 LAB — COMPREHENSIVE METABOLIC PANEL
Alkaline Phosphatase: 85 U/L (ref 39–117)
BUN: 35 mg/dL — ABNORMAL HIGH (ref 6–23)
Chloride: 94 mEq/L — ABNORMAL LOW (ref 96–112)
Glucose, Bld: 93 mg/dL (ref 70–99)
Potassium: 5.3 mEq/L — ABNORMAL HIGH (ref 3.5–5.1)
Total Bilirubin: 0.6 mg/dL (ref 0.3–1.2)
Total Protein: 6.6 g/dL (ref 6.0–8.3)

## 2011-02-07 LAB — DIFFERENTIAL
Eosinophils Relative: 2 % (ref 0–5)
Lymphocytes Relative: 22 % (ref 12–46)
Lymphs Abs: 1.6 10*3/uL (ref 0.7–4.0)
Monocytes Relative: 7 % (ref 3–12)

## 2011-02-07 LAB — CBC
HCT: 26.9 % — ABNORMAL LOW (ref 39.0–52.0)
MCV: 87.6 fL (ref 78.0–100.0)
RDW: 16.9 % — ABNORMAL HIGH (ref 11.5–15.5)
WBC: 7.5 10*3/uL (ref 4.0–10.5)

## 2011-02-09 ENCOUNTER — Emergency Department (HOSPITAL_COMMUNITY): Payer: Medicare Other

## 2011-02-09 ENCOUNTER — Inpatient Hospital Stay (HOSPITAL_COMMUNITY)
Admission: EM | Admit: 2011-02-09 | Discharge: 2011-02-12 | DRG: 193 | Disposition: A | Discharge status: Home / Self Care | Payer: Medicare Other | Attending: Family Medicine | Admitting: Family Medicine

## 2011-02-09 DIAGNOSIS — F319 Bipolar disorder, unspecified: Secondary | ICD-10-CM | POA: Diagnosis present

## 2011-02-09 DIAGNOSIS — Z9861 Coronary angioplasty status: Secondary | ICD-10-CM

## 2011-02-09 DIAGNOSIS — J4489 Other specified chronic obstructive pulmonary disease: Secondary | ICD-10-CM | POA: Diagnosis present

## 2011-02-09 DIAGNOSIS — F259 Schizoaffective disorder, unspecified: Secondary | ICD-10-CM | POA: Diagnosis present

## 2011-02-09 DIAGNOSIS — R0902 Hypoxemia: Secondary | ICD-10-CM | POA: Diagnosis present

## 2011-02-09 DIAGNOSIS — I2510 Atherosclerotic heart disease of native coronary artery without angina pectoris: Secondary | ICD-10-CM | POA: Diagnosis present

## 2011-02-09 DIAGNOSIS — D631 Anemia in chronic kidney disease: Secondary | ICD-10-CM | POA: Diagnosis present

## 2011-02-09 DIAGNOSIS — Z9119 Patient's noncompliance with other medical treatment and regimen: Secondary | ICD-10-CM

## 2011-02-09 DIAGNOSIS — F17200 Nicotine dependence, unspecified, uncomplicated: Secondary | ICD-10-CM | POA: Diagnosis present

## 2011-02-09 DIAGNOSIS — Z992 Dependence on renal dialysis: Secondary | ICD-10-CM

## 2011-02-09 DIAGNOSIS — I509 Heart failure, unspecified: Secondary | ICD-10-CM | POA: Diagnosis present

## 2011-02-09 DIAGNOSIS — J189 Pneumonia, unspecified organism: Principal | ICD-10-CM | POA: Diagnosis present

## 2011-02-09 DIAGNOSIS — Z91199 Patient's noncompliance with other medical treatment and regimen due to unspecified reason: Secondary | ICD-10-CM

## 2011-02-09 DIAGNOSIS — I120 Hypertensive chronic kidney disease with stage 5 chronic kidney disease or end stage renal disease: Secondary | ICD-10-CM | POA: Diagnosis present

## 2011-02-09 DIAGNOSIS — I5023 Acute on chronic systolic (congestive) heart failure: Secondary | ICD-10-CM | POA: Diagnosis present

## 2011-02-09 DIAGNOSIS — N039 Chronic nephritic syndrome with unspecified morphologic changes: Secondary | ICD-10-CM | POA: Diagnosis present

## 2011-02-09 DIAGNOSIS — N186 End stage renal disease: Secondary | ICD-10-CM | POA: Diagnosis present

## 2011-02-09 DIAGNOSIS — J449 Chronic obstructive pulmonary disease, unspecified: Secondary | ICD-10-CM | POA: Diagnosis present

## 2011-02-09 LAB — DIFFERENTIAL
Basophils Absolute: 0 10*3/uL (ref 0.0–0.1)
Basophils Relative: 0 % (ref 0–1)
Neutro Abs: 4.3 10*3/uL (ref 1.7–7.7)
Neutrophils Relative %: 68 % (ref 43–77)

## 2011-02-09 LAB — BASIC METABOLIC PANEL
BUN: 24 mg/dL — ABNORMAL HIGH (ref 6–23)
CO2: 30 mEq/L (ref 19–32)
Chloride: 94 mEq/L — ABNORMAL LOW (ref 96–112)
Creatinine, Ser: 5.21 mg/dL — ABNORMAL HIGH (ref 0.4–1.5)
Glucose, Bld: 94 mg/dL (ref 70–99)
Potassium: 5.2 mEq/L — ABNORMAL HIGH (ref 3.5–5.1)

## 2011-02-09 LAB — POCT CARDIAC MARKERS: Troponin i, poc: <0.05 ng/mL (ref 0.00–0.09)

## 2011-02-09 LAB — CBC
Hemoglobin: 9.1 g/dL — ABNORMAL LOW (ref 13.0–17.0)
RBC: 3.18 MIL/uL — ABNORMAL LOW (ref 4.22–5.81)
WBC: 6.4 10*3/uL (ref 4.0–10.5)

## 2011-02-10 ENCOUNTER — Inpatient Hospital Stay (HOSPITAL_COMMUNITY): Payer: Medicare Other

## 2011-02-10 LAB — DIFFERENTIAL
Eosinophils Absolute: 0 10*3/uL (ref 0.0–0.7)
Lymphs Abs: 0.6 10*3/uL — ABNORMAL LOW (ref 0.7–4.0)
Neutro Abs: 3.6 10*3/uL (ref 1.7–7.7)
Neutrophils Relative %: 82 % — ABNORMAL HIGH (ref 43–77)

## 2011-02-10 LAB — BASIC METABOLIC PANEL
BUN: 33 mg/dL — ABNORMAL HIGH (ref 6–23)
CO2: 26 mEq/L (ref 19–32)
Chloride: 97 mEq/L (ref 96–112)
GFR calc Af Amer: 12 mL/min — ABNORMAL LOW (ref >60)
Potassium: 4.8 mEq/L (ref 3.5–5.1)

## 2011-02-10 LAB — CBC
Hemoglobin: 8.8 g/dL — ABNORMAL LOW (ref 13.0–17.0)
MCV: 89 fL (ref 78.0–100.0)
Platelets: 211 10*3/uL (ref 150–400)
RBC: 3.1 MIL/uL — ABNORMAL LOW (ref 4.22–5.81)
WBC: 4.4 10*3/uL (ref 4.0–10.5)

## 2011-02-12 ENCOUNTER — Emergency Department (HOSPITAL_COMMUNITY): Payer: Medicare Other

## 2011-02-12 ENCOUNTER — Inpatient Hospital Stay (HOSPITAL_COMMUNITY): Payer: Medicare Other

## 2011-02-12 ENCOUNTER — Emergency Department (HOSPITAL_COMMUNITY)
Admission: EM | Admit: 2011-02-12 | Discharge: 2011-02-12 | Disposition: A | Discharge status: Home / Self Care | Payer: Medicare Other | Source: Home / Self Care | Attending: Emergency Medicine | Admitting: Emergency Medicine

## 2011-02-12 LAB — BASIC METABOLIC PANEL
BUN: 37 mg/dL — ABNORMAL HIGH (ref 6–23)
BUN: 27 mg/dL — ABNORMAL HIGH (ref 6–23)
CO2: 27 mEq/L (ref 19–32)
Calcium: 9.4 mg/dL (ref 8.4–10.5)
Chloride: 96 mEq/L (ref 96–112)
Creatinine, Ser: 6.96 mg/dL — ABNORMAL HIGH (ref 0.4–1.5)
GFR calc non Af Amer: 14 mL/min — ABNORMAL LOW (ref >60)
Glucose, Bld: 80 mg/dL (ref 70–99)
Glucose, Bld: 104 mg/dL — ABNORMAL HIGH (ref 70–99)
Potassium: 4.8 mEq/L (ref 3.5–5.1)
Sodium: 132 mEq/L — ABNORMAL LOW (ref 135–145)

## 2011-02-12 LAB — CBC
HCT: 24.8 % — ABNORMAL LOW (ref 39.0–52.0)
Hemoglobin: 8 g/dL — ABNORMAL LOW (ref 13.0–17.0)
MCH: 28.9 pg (ref 26.0–34.0)
MCHC: 32.3 g/dL (ref 30.0–36.0)
MCV: 89.2 fL (ref 78.0–100.0)
MCV: 88.9 fL (ref 78.0–100.0)
Platelets: 211 10*3/uL (ref 150–400)
RBC: 2.77 MIL/uL — ABNORMAL LOW (ref 4.22–5.81)
RDW: 17.1 % — ABNORMAL HIGH (ref 11.5–15.5)
WBC: 7.7 10*3/uL (ref 4.0–10.5)

## 2011-02-12 LAB — DIFFERENTIAL
Basophils Absolute: 0 10*3/uL (ref 0.0–0.1)
Basophils Relative: 0 % (ref 0–1)
Lymphs Abs: 1.8 10*3/uL (ref 0.7–4.0)
Lymphs Abs: 1.3 10*3/uL (ref 0.7–4.0)
Monocytes Relative: 8 % (ref 3–12)
Monocytes Relative: 9 % (ref 3–12)
Neutro Abs: 3.9 10*3/uL (ref 1.7–7.7)
Neutro Abs: 5.5 10*3/uL (ref 1.7–7.7)
Neutrophils Relative %: 62 % (ref 43–77)
Neutrophils Relative %: 72 % (ref 43–77)

## 2011-02-12 LAB — POCT CARDIAC MARKERS: Myoglobin, poc: 270 ng/mL (ref 12–200)

## 2011-02-13 ENCOUNTER — Emergency Department (HOSPITAL_COMMUNITY): Payer: Medicare Other

## 2011-02-13 ENCOUNTER — Emergency Department (HOSPITAL_COMMUNITY)
Admission: EM | Admit: 2011-02-13 | Discharge: 2011-02-13 | Disposition: A | Discharge status: Home / Self Care | Payer: Medicare Other | Attending: Emergency Medicine | Admitting: Emergency Medicine

## 2011-02-13 DIAGNOSIS — J4489 Other specified chronic obstructive pulmonary disease: Secondary | ICD-10-CM | POA: Insufficient documentation

## 2011-02-13 DIAGNOSIS — J449 Chronic obstructive pulmonary disease, unspecified: Secondary | ICD-10-CM | POA: Insufficient documentation

## 2011-02-13 DIAGNOSIS — N186 End stage renal disease: Secondary | ICD-10-CM | POA: Insufficient documentation

## 2011-02-13 DIAGNOSIS — E785 Hyperlipidemia, unspecified: Secondary | ICD-10-CM | POA: Insufficient documentation

## 2011-02-13 DIAGNOSIS — R0609 Other forms of dyspnea: Secondary | ICD-10-CM | POA: Insufficient documentation

## 2011-02-13 DIAGNOSIS — R0989 Other specified symptoms and signs involving the circulatory and respiratory systems: Secondary | ICD-10-CM | POA: Insufficient documentation

## 2011-02-13 DIAGNOSIS — E875 Hyperkalemia: Secondary | ICD-10-CM | POA: Insufficient documentation

## 2011-02-13 DIAGNOSIS — R0789 Other chest pain: Secondary | ICD-10-CM | POA: Insufficient documentation

## 2011-02-13 DIAGNOSIS — Z79899 Other long term (current) drug therapy: Secondary | ICD-10-CM | POA: Insufficient documentation

## 2011-02-13 DIAGNOSIS — Z7982 Long term (current) use of aspirin: Secondary | ICD-10-CM | POA: Insufficient documentation

## 2011-02-13 DIAGNOSIS — Z992 Dependence on renal dialysis: Secondary | ICD-10-CM | POA: Insufficient documentation

## 2011-02-13 DIAGNOSIS — I10 Essential (primary) hypertension: Secondary | ICD-10-CM | POA: Insufficient documentation

## 2011-02-13 DIAGNOSIS — J189 Pneumonia, unspecified organism: Secondary | ICD-10-CM | POA: Insufficient documentation

## 2011-02-13 LAB — POCT CARDIAC MARKERS
CKMB, poc: 1 ng/mL (ref 1.0–8.0)
Myoglobin, poc: 253 ng/mL (ref 12–200)
Troponin i, poc: <0.05 ng/mL (ref 0.00–0.09)

## 2011-02-13 LAB — POTASSIUM: Potassium: 6.1 mEq/L — ABNORMAL HIGH (ref 3.5–5.1)

## 2011-02-13 LAB — BLOOD GAS, ARTERIAL
Drawn by: 23534
FIO2: 0.21 %
O2 Content: 21 L/min
Patient temperature: 37
TCO2: 25.3 mmol/L (ref 0–100)
pH, Arterial: 7.42 (ref 7.350–7.450)

## 2011-02-13 LAB — COMPREHENSIVE METABOLIC PANEL
Alkaline Phosphatase: 74 U/L (ref 39–117)
BUN: 32 mg/dL — ABNORMAL HIGH (ref 6–23)
Calcium: 9.8 mg/dL (ref 8.4–10.5)
Creatinine, Ser: 5.5 mg/dL — ABNORMAL HIGH (ref 0.4–1.5)
Glucose, Bld: 100 mg/dL — ABNORMAL HIGH (ref 70–99)
Total Protein: 6.2 g/dL (ref 6.0–8.3)

## 2011-02-13 LAB — CBC
Platelets: 195 10*3/uL (ref 150–400)
RBC: 2.87 MIL/uL — ABNORMAL LOW (ref 4.22–5.81)
RDW: 17 % — ABNORMAL HIGH (ref 11.5–15.5)
WBC: 7.6 10*3/uL (ref 4.0–10.5)

## 2011-02-13 LAB — DIFFERENTIAL
Basophils Absolute: 0 10*3/uL (ref 0.0–0.1)
Eosinophils Absolute: 0.2 10*3/uL (ref 0.0–0.7)
Eosinophils Relative: 3 % (ref 0–5)
Neutrophils Relative %: 78 % — ABNORMAL HIGH (ref 43–77)

## 2011-02-14 NOTE — H&P (Signed)
NAMEASHAZ, ROBLING               ACCOUNT NO.:  [id number]  MEDICAL RECORD NO.:  [id number]           PATIENT TYPE:  I  LOCATION:  IC09                          FACILITY:  [facility]  PHYSICIAN:  Tykel Badie L. Juanetta Gosling, M.D.DATE OF BIRTH:  10/31/1976  DATE OF ADMISSION:  01/26/2011 DATE OF DISCHARGE:  LH                             HISTORY & PHYSICAL   Patient of Dr. Janna Arch.  REASON FOR ADMISSION:  CHF.  HISTORY:  Mr. Prieto is a 35 year old who has multiple medical problems including end-stage renal failure, on dialysis, multiple admissions for CHF, COPD, asthma, coronary artery occlusive disease, hypertension, cardiomyopathy, chronic pain, depression, GERD, hiatal hernia, hyperlipidemia, and schizophrenia.  He came to the emergency room because of shortness of breath.  When he was evaluated in the ER, he was found to be in what appeared to be congestive heart failure.  He has a history of difficulty with continued to take his medications.  He has as mentioned a systolic ischemic cardiomyopathy.  Medications at least at the time of discharge from the hospital and his last hospitalization in January and his medications apparently of, 1. Trazodone 50 mg at bedtime. 1. Albuterol inhaler 2 puffs 3 times a day. 2. Aspirin 325 mg daily. 3. Xanax 0.25 mg p.o. 3 times a day. 4. Amlodipine 10 mg daily. 5. Carvedilol 12.5 mg b.i.d. 6. Clonidine 0.1 mg p.o. t.i.d. 7. Crestor 20 mg daily 8. Cymbalta 60 mg daily. 9. Hydralazine 50 mg t.i.d. 10.Labetalol 400 mg b.i.d. 11.Nephro-Vite 1 daily. 12.Plavix 75 mg daily. 13.Calcium carbonate 800 mg 2-3 tablets 3 times a day. 14.Ropinirole 2 mg at bedtime for restless leg. 15.Zyprexa 10 mg b.i.d.  He has had previous stenting of his coronary arteries.  SOCIAL HISTORY:  He smokes about a pack of cigarettes daily.  Says that he does not use any alcohol.  FAMILY HISTORY:  Positive for coronary artery disease on both sides of his family.  REVIEW  OF SYSTEMS:  Except as mentioned is negative.  ALLERGIES:  He has multiple medication allergies.  PHYSICAL EXAMINATION:  GENERAL:  A well-developed, well-nourished somewhat obese male who is in no acute distress.  He has BiPAP in place. VITAL SIGNS:  His blood pressure is about 180/100, pulse in the 90s, and respirations 16. HEENT:  Mucous membranes are moist. NECK:  Supple without masses. HEART:  Regular without gallop. ABDOMEN:  Soft. CHEST:  Rales in bases bilaterally.  He has 1-2 plus edema of both lower extremities. CENTRAL NERVOUS SYSTEM:  He is sleepy but arousable.  He can move all 4 extremities.  LABORATORY WORK:  His BNP is 2070.  BMET, BUN is 20, creatinine 5.46, and his potassium is 4.1.  His CBC shows white count 7300, hemoglobin 9.4, and platelets 232.  Cardiac markers, myoglobin is 352.  His blood gas on 100% showed pH 7.37, pCO2 of 46.6, and pO2 of 74.6.  Overall I think has CHF, probably on multifactorial, although he has a renal failure.  I am going to give him some Lasix and see if it makes any difference.  He will be on BiPAP, we hold anything  that might cause him to have worsened respiratory status.  I asked Dr. Kristian Covey, his nephrologist to see him for potential dialysis tomorrow.  Continue with all the other medications and follow.  Dr. Janna Arch will resume his care in the morning.     Lelah Rennaker L. Juanetta Gosling, M.D.     ELH/MEDQ  D:  01/26/2011  T:  01/27/2011  Job:  [id number]  Electronically Signed by Kari Baars M.D. on 02/14/2011 08:52:41 AM

## 2011-02-15 ENCOUNTER — Emergency Department (HOSPITAL_COMMUNITY)
Admission: EM | Admit: 2011-02-15 | Discharge: 2011-02-15 | Discharge status: Against Medical Advice | Payer: Medicare Other | Attending: Emergency Medicine | Admitting: Emergency Medicine

## 2011-02-15 DIAGNOSIS — R079 Chest pain, unspecified: Secondary | ICD-10-CM | POA: Insufficient documentation

## 2011-02-15 DIAGNOSIS — I10 Essential (primary) hypertension: Secondary | ICD-10-CM | POA: Insufficient documentation

## 2011-02-15 DIAGNOSIS — I2510 Atherosclerotic heart disease of native coronary artery without angina pectoris: Secondary | ICD-10-CM | POA: Insufficient documentation

## 2011-02-15 DIAGNOSIS — N186 End stage renal disease: Secondary | ICD-10-CM | POA: Insufficient documentation

## 2011-02-15 DIAGNOSIS — Z992 Dependence on renal dialysis: Secondary | ICD-10-CM | POA: Insufficient documentation

## 2011-02-15 DIAGNOSIS — I120 Hypertensive chronic kidney disease with stage 5 chronic kidney disease or end stage renal disease: Secondary | ICD-10-CM | POA: Insufficient documentation

## 2011-02-15 DIAGNOSIS — Z79899 Other long term (current) drug therapy: Secondary | ICD-10-CM | POA: Insufficient documentation

## 2011-02-15 DIAGNOSIS — I428 Other cardiomyopathies: Secondary | ICD-10-CM | POA: Insufficient documentation

## 2011-02-21 ENCOUNTER — Encounter: Payer: Self-pay | Admitting: Urgent Care

## 2011-02-21 NOTE — Consult Note (Signed)
[name], [name]               ACCOUNT NO.:  [id number]  MEDICAL RECORD NO.:  [id number]           PATIENT TYPE:  LOCATION:                                 FACILITY:  PHYSICIAN:  Jorja Loa, M.D.DATE OF BIRTH:  1976-10-10  DATE OF CONSULTATION: DATE OF DISCHARGE:                                CONSULTATION   REASON FOR CONSULTATION:  End-stage renal disease, for hemodialysis.  This is one of multiple admissions for [name] who has been in the hospital recently for CHF and pneumonia.  Presently, came through the emergency room with complaints of right-sided heart failure and with some radiation to his left chest.  The patient denies any nausea or vomiting.  Appetite is good.  However, when he was evaluated in the emergency room, he was found to have also hypoxia, hence admitted for further workup and management.  Presently, patient denies any history of shortness of breath.  No orthopnea and he said his pain also seems to be getting better since he came to the hospital.  PAST MEDICAL HISTORY: 1. He has history of CHF. 2. History of end-stage renal disease.  He is on hemodialysis;     Tuesday, Thursday, and Saturday; last dialysis was yesterday. 3. History of hypertension. 4. History of coronary artery disease status post stent placement. 5. History of depression. 6. History of chronic chest pain. 7. History of COPD. 8. History of anemia. 9. History of bipolar disorder. 10.History of restless legs syndrome. 11.History of hyponatremia. 12.History of intermittent short runs of Vtach when he was in the     hospital the last time.  MEDICATIONS: 1. Ventolin inhaler. 2. He is also on Norvasc 10 mg p.o. daily. 3. Coreg 12.5 mg p.o. daily. 4. Cymbalta 60 mg p.o. daily. 5. Catapres 0.1 mg p.o. t.i.d. 6. Plavix 75 mg p.o. daily. 7. Normodyne 400 mg p.o. b.i.d., but the last time when he was     discharged, the clonidine was stopped and instead of that the  labetalol was increased to 800 mg p.o. b.i.d. 8. The patient is also getting Renagel 800 mg p.o. t.i.d. with each     meal. 9. He is also getting Crestor 20 mg p.o. daily. 10.Cymbalta 60 mg p.o. daily.  SOCIAL HISTORY:  He has history of smoking.  He has also occasional history of alcohol abuse.  Previous history of drug use, recently he denies any.  FAMILY HISTORY:  No history of renal failure.  ALLERGIES:  He has allergy to IBUPROFEN/NAPROSYN.  He is also allergy to LITHIUM, METHADONE, ULTRAM, ZOCOR, KETOROLAC, and FENTANYL.  REVIEW OF SYSTEMS:  He denies any nausea or vomiting.  His main complaint seems to be chest pain, right-sided, sharp, radiating to his left chest; not associated with shortness of breath or orthopnea.  No fevers, chills, or sweating.  Appetite is good.  He denies any diarrhea. No urgency or frequency.  PHYSICAL EXAMINATION:  VITAL SIGNS:  His blood pressure in ER was 190/100, afebrile and repeat one was 197/98, heart rate was 92, respiratory rate 18.  CHEST:  He has some inspiratory crackles, but otherwise seems to  be okay.  No rales, no rhonchi, no egophony.  He has also a little bit of wheezing. HEART:  Regular rate and rhythm.  No murmur. ABDOMEN:  Soft, positive bowel sounds. EXTREMITIES:  He does not have any edema.  His blood work from the emergency room, white blood cell count is 6.4, hemoglobin 9.5, hematocrit 28.3, and platelets of 250.  Sodium is 135, potassium 5.2, BUN is 24, creatinine is 5.2, calcium 9.6, and albumin is 3.7.  His CPK is less than 1.  Troponin is 0.07.  Myoglobin is 427.  He has a chest x-ray that showed there is a mild increase in sign of CHF, developing superimposed airspace disease in the right lung, suggests superimposed pneumonia.  ASSESSMENT: 1. Chest pain.  At this moment, the patient feels better.  He is     asymptomatic.  Cardiac enzymes are normal, but the patient seems to     have recurrent chest pain.  Very  difficult to know whether truly     the patient has any issue cardiac-wise. 2. History of coronary artery disease status post stent placement. 3. History of anemia.  He is on Epogen. 4. History of congestive heart failure, most of the time because of     noncompliance with fluid and dialysis and he was dialyzed     yesterday.  Clinically, he looks okay. 5. History of end-stage renal disease status post hemodialysis     yesterday.  BUN and creatinine __________ and potassium high normal     but still stable. 6. History of hypertension.  Blood pressure seems to be high, possibly     since he spent his time in the hospital, he did not take his     medication. 7. History of bipolar disorder. 8. History of depression. 9. History of restless legs syndrome. 10.History of hypercholesterolemia. 11.History of hyperphosphatemia.  He is on a binder. 12.History of chronic obstructive pulmonary disease, continued to     smoke.  He is on inhaler, but clinically seems to be doing good.  RECOMMENDATIONS:  We will make arrangement for the patient to get dialysis tomorrow.  We will use 2K 2.5 calcium bath.  We will try to get about 4 L. We will continue his other treatment as before.     Jorja Loa, M.D.     BB/MEDQ  D:  02/09/2011  T:  02/10/2011  Job:  [id number]  Electronically Signed by Jorja Loa M.D. on 02/21/2011 01:50:46 PM

## 2011-02-21 NOTE — Consult Note (Signed)
NAMEEZEKIEL, MENZER               ACCOUNT NO.:  [id number]  MEDICAL RECORD NO.:  [id number]           PATIENT TYPE:  LOCATION:                                 FACILITY:  PHYSICIAN:  Jorja Loa, M.D.DATE OF BIRTH:  1976/09/21  DATE OF CONSULTATION: DATE OF DISCHARGE:                                CONSULTATION   REASON FOR CONSULTATION:  CHF.  This is one of multiple admissions for Yerger who has been here last week with similar problem, dialyzed, and the patient left against medical advise to come back yesterday to the emergency room with complaints of shortness of breath, CHF.  The patient is very noncompliant with medications and also with salt and fluid intake.  Most of the time, he gains occasionally up to 12 and 15 liters, making it difficult even to remove the fluid with his regular treatment.  Multiple times, the patient has also failed to come to dialysis unit, but unfortunately the patient ended up in the emergency room and presently he came with similar issue.  The patient has history of bipolar disorder and also prescription pain seeking behavior.  Presently, he denies any nausea or vomiting.  His main problem is difficulty in breathing.  PAST MEDICAL HISTORY: 1. Recurrent CHF because of noncompliance with his diet and also     occasionally dialysis. 2. History of end-stage renal disease status post dialysis on     Saturday. 3. History of hypertension. 4. History of depression. 5. History of coronary artery, status post stent placement. 6. History of chronic chest pain. 7. History of anemia. 8. History of COPD. 9. History of restless legs syndrome. 10.History of bipolar disorder. 11.History of hyponatremia. 12.History of glycemia.  SOCIAL HISTORY:  He has history of smoking and also questionable alcohol use and occasionally also illicit drug use.  Presently, he said he does not use any.  His medication consists of: 1. Ventolin inhaler 2.5 q.4  hours. 2. Norvasc 10 mg p.o. daily. 3. Aspirin 81 mg p.o. daily. 4. Coreg 12.5 mg p.o. b.i.d. 5. Catapres 0.1 mg p.o. t.i.d. 6. Plavix 75 mg p.o. daily. 7. Cymbalta 60 mg p.o. daily. 8. Hydralazine 50 mg p.o. t.i.d. 9. Atrovent 0.5 mg inhaler q.4 hours. 10.Normodyne 400 mg p.o. b.i.d. 11.Prinivil 20 mg p.o. daily. 12.Zyprexa 10 mg p.o. t.i.d. 13.Zosyn 3.375 mg IV. 14.Nephro-Vite 1 tablet p.o. daily. 15.ReQuip 2 mg p.o. at bedtime. 16.Crestor 10 mg p.o. daily. 17.Renagel 800 mg 3 tablets p.o. t.i.d. with meals. 18.He also received vancomycin 1 g.  ALLERGIES:  He is allergic to IBUPROFEN, NAPROSYN, LITHIUM, METHADONE, PHENOTIL, ULTRAM, ZOCOR, and KETOROLAC.  FAMILY HISTORY:  No history of renal failure.  REVIEW OF SYSTEMS:  Mainly difficulty in breathing and increased swelling, orthopnea, paroxysmal nocturnal dyspnea.  He said also he has occasional cough and no sputum production.  He denies any chest pain. He denies any nausea or vomiting.  No diarrhea.  PHYSICAL EXAMINATION:  VITAL SIGNS:  His blood pressure is 177/100, temperature 98.3, pulse of 80, respiratory rate is 19.  He is on BiPAP. HEENT:  Facial puffiness and also swelling of his eyelids.  Moist oral mucosa.  He has increased JVD. CHEST:  He has some expiratory crackles and expiratory wheezing. HEART:  Reveals regular rate and rhythm.  No murmur.  No S3. ABDOMEN:  Soft, positive bowel sounds. EXTREMITIES:  He has 1+ edema.  BLOOD WORK:  His white blood cell count is 9.3, hemoglobin 9.3, hematocrit 27.3, platelet of 228.  Sodium 131, potassium 4, BUN is 22, creatinine is 5.91.  His BNP is 3200.  He has chest x-ray which was done yesterday which showed worsening of bilateral airspace disease.  There is a concern for multifocal pneumonia.  ASSESSMENT: 1. Shortness of breath, probably a combination of pneumonia and also     congestive heart failure.  The patient was in the hospital, signed     off himself against  medical advise on Friday, came back today with     similar complaints. 2. History of possible pneumonia bilaterally.  He is afebrile.  White     blood cell count is normal.  He is on antibiotics. 3. History of hypertension.  Blood pressure seems to be high. 4. History of end-stage renal disease.  He is status post hemodialysis     on Saturday.  BUN and creatinine within acceptable range. 5. History of depression. 6. History of COPD.  He is on inhaler. 7. History of anemia.  He is on Epogen as an outpatient. 8. History of restless legs syndrome. 9. History of bipolar disorder. 10.History of dyslipidemia. 11.History of coronary artery disease, status post stent placement.  RECOMMENDATIONS:  I will make arrangement for the patient to get dialysis today.  We will dialyze him for 4 hours.  We will use 3K bath. We will try to get about 4 liters if his blood pressure tolerates and will continue his other medications and will follow the patient.     Jorja Loa, M.D.     BB/MEDQ  D:  01/31/2011  T:  01/31/2011  Job:  [id number]  Electronically Signed by Jorja Loa M.D. on 02/21/2011 01:50:39 PM

## 2011-02-21 NOTE — Consult Note (Addendum)
[name], [name]               ACCOUNT NO.:  [id number]  MEDICAL RECORD NO.:  [id number]           PATIENT TYPE:  LOCATION:                                 FACILITY:  PHYSICIAN:  Jorja Loa, M.D.DATE OF BIRTH:  August 08, 1976  DATE OF CONSULTATION:  01/27/2011 DATE OF DISCHARGE:                                CONSULTATION   REASON FOR CONSULTATION:  CHF and for hemodialysis.  HISTORY OF PRESENT ILLNESS:  This is one of multiple admissions for history of end-stage renal disease, on maintenance hemodialysis, history of COPD and coronary artery disease, bipolar disorder, who comes to the emergency room frequently after became fluid overload, occasionally misses dialysis, got at this time.  Presently came to the emergency room with similar problem.  Since the patient was found to have CHF, admitted to the hospital for further management and also dialysis.  The patient denies any nausea or vomiting.  PAST MEDICAL HISTORY: 1. The patient has recurrent history of CHF, literally he comes once     or twice a week, last admission was in January. 2. History of end-stage renal disease, on maintenance hemodialysis,     Tuesday, Thursday, Saturday. 3. History of hypertension. 4. Coronary artery disease, status post stent placement. 5. History of depression. 6. History of chronic chest pain, stable angina. 7. History of COPD. 8. History of chronic pain, seeking medication behavior. 9. History of anemia. 10.History of restless leg syndrome. 11.History of bipolar disorder. 12.Hyponatremia. 13.History of dyslipidemia.  SOCIAL HISTORY:  He has history of smoking.  He has also occasional use of illicit drug, questionable alcohol use.  FAMILY HISTORY:  No history of renal failure.  MEDICATIONS: 1. Norvasc 10 mg p.o. daily. 2. Aspirin 325 mg p.o. daily. 3. Coreg 12.5 mg p.o. b.i.d. 4. Catapres 0.1 mg p.o. q.6.h. 5. Plavix 75 mg p.o. daily. 6. He is also getting Cymbalta 60 mg  p.o. daily. 7. Lovenox 30 mg subcu. 8. He is also on Epogen. 9. He was given Lasix. 10.Neurontin 300 mg p.o. t.i.d. 11.Normodyne 400 mg p.o. b.i.d. 12.Prinivil 20 mg p.o. daily. 13.Zyprexa 10 mg p.o. daily. 14.Protonix 40 mg p.o. daily. 15.Crestor 20 mg p.o. daily. 16.He is on Ventolin inhaler. 17.He was also getting medication for his anxiety. 18.Xanax 0.5 mg p.o. t.i.d. 19.Atrovent inhaler.  ALLERGIES:  He is allergic to LITHIUM, NAPROSYN, IBUPROFEN and also PHENYTOIN, METHADONE, ULTRAM, ZOCOR, and KETOROLAC.  REVIEW OF SYSTEMS:  His main complaint seems to be difficulty breathing associated with orthopnea, paroxysmal nocturnal dyspnea.  He denies any nausea or vomiting.  Appetite is good.  He denies any urgency, frequency, has swelling of the legs also.  PHYSICAL EXAMINATION:  VITAL SIGNS:  Temperature is 98.3, pulse of 85, and blood pressure is 190/100. HEENT:  He has puffiness with his eyelids.  No conjunctival pallor.  No icterus.  Oral mucosa seems to be moist.  He is on BiPAP. CHEST:  Bilateral expiratory crackles and some expiratory wheezing. HEART:  Regular rate and rhythm.  No murmur, no S3. ABDOMEN:  Soft, positive bowel sounds. EXTREMITIES:  He has 2+ edema.  His blood work his  blood gas from yesterday pH was 7.37, pCO2 of 46. His white blood cell count is 7.6, hemoglobin 9.4, hematocrit 27.4, sodium 130, potassium 4.1, BUN is 20, creatinine is 5.46.  ASSESSMENT: 1. Congestive heart failure.  The patient is noncompliant with diet     with his fluid and salt intake, recurrent problem.  Presently came     with shortness of breath, chest x-ray was done which showed     basically some interstitial edema. 2. End-stage renal disease.  His BUN and creatinine within acceptable     range.  His potassium is okay.  He has gone the last dialysis. 3. Hypertension, blood pressure seems to be high most of the time, the     patient is on multiple medication but does not take  most of it.  4.     History of coronary artery disease, status post stent placement     symptomatic. 4. History of bipolar disorder. 5. History of dyslipidemia. 6. History of chronic obstructive pulmonary disease.  The patient     continues to smoke. 7. History of chronic chest pain. 8. History of restless leg syndrome. 9. History of hyponatremia. 10.Also history of noncompliance with diet and medication.  RECOMMENDATIONS:  We will make arrangement for the patient to get dialysis today.  We will try to remove about 4 liters and we will use 2K 2.5 calcium bath.  We will continue to advise the patient to bring down his fluid and salt intake.     Jorja Loa, M.D.     BB/MEDQ  D:  01/27/2011  T:  01/28/2011  Job:  [id number]  Electronically Signed by Jorja Loa M.D. on 02/21/2011 01:50:37 PM

## 2011-02-24 ENCOUNTER — Emergency Department (HOSPITAL_COMMUNITY): Payer: Medicare Other

## 2011-02-24 ENCOUNTER — Inpatient Hospital Stay (HOSPITAL_COMMUNITY)
Admission: EM | Admit: 2011-02-24 | Discharge: 2011-02-28 | DRG: 193 | Disposition: A | Discharge status: Home / Self Care | Payer: Medicare Other | Attending: Family Medicine | Admitting: Family Medicine

## 2011-02-24 DIAGNOSIS — E785 Hyperlipidemia, unspecified: Secondary | ICD-10-CM | POA: Diagnosis present

## 2011-02-24 DIAGNOSIS — F319 Bipolar disorder, unspecified: Secondary | ICD-10-CM | POA: Diagnosis present

## 2011-02-24 DIAGNOSIS — D631 Anemia in chronic kidney disease: Secondary | ICD-10-CM | POA: Diagnosis present

## 2011-02-24 DIAGNOSIS — I120 Hypertensive chronic kidney disease with stage 5 chronic kidney disease or end stage renal disease: Secondary | ICD-10-CM | POA: Diagnosis present

## 2011-02-24 DIAGNOSIS — Z91199 Patient's noncompliance with other medical treatment and regimen due to unspecified reason: Secondary | ICD-10-CM

## 2011-02-24 DIAGNOSIS — I2510 Atherosclerotic heart disease of native coronary artery without angina pectoris: Secondary | ICD-10-CM | POA: Diagnosis present

## 2011-02-24 DIAGNOSIS — Z992 Dependence on renal dialysis: Secondary | ICD-10-CM

## 2011-02-24 DIAGNOSIS — N186 End stage renal disease: Secondary | ICD-10-CM | POA: Diagnosis present

## 2011-02-24 DIAGNOSIS — Z9119 Patient's noncompliance with other medical treatment and regimen: Secondary | ICD-10-CM

## 2011-02-24 DIAGNOSIS — I509 Heart failure, unspecified: Secondary | ICD-10-CM | POA: Diagnosis present

## 2011-02-24 DIAGNOSIS — J189 Pneumonia, unspecified organism: Principal | ICD-10-CM | POA: Diagnosis present

## 2011-02-24 LAB — COMPREHENSIVE METABOLIC PANEL
BUN: 13 mg/dL (ref 6–23)
Calcium: 9.8 mg/dL (ref 8.4–10.5)
Creatinine, Ser: 4.33 mg/dL — ABNORMAL HIGH (ref 0.4–1.5)
Glucose, Bld: 104 mg/dL — ABNORMAL HIGH (ref 70–99)
Total Protein: 6.5 g/dL (ref 6.0–8.3)

## 2011-02-24 LAB — BLOOD GAS, ARTERIAL
Acid-Base Excess: 3.6 mmol/L — ABNORMAL HIGH (ref 0.0–2.0)
Expiratory PAP: 8
FIO2: 40 %
Inspiratory PAP: 20
O2 Saturation: 94.1 %
Patient temperature: 37

## 2011-02-24 LAB — CBC
MCH: 28 pg (ref 26.0–34.0)
MCV: 86 fL (ref 78.0–100.0)
Platelets: 254 10*3/uL (ref 150–400)
RDW: 16.9 % — ABNORMAL HIGH (ref 11.5–15.5)
WBC: 4.4 10*3/uL (ref 4.0–10.5)

## 2011-02-24 LAB — DIFFERENTIAL
Eosinophils Absolute: 0.1 10*3/uL (ref 0.0–0.7)
Eosinophils Relative: 2 % (ref 0–5)
Lymphs Abs: 1.1 10*3/uL (ref 0.7–4.0)
Monocytes Relative: 11 % (ref 3–12)

## 2011-02-24 LAB — LIPASE, BLOOD: Lipase: 25 U/L (ref 11–59)

## 2011-02-24 NOTE — H&P (Signed)
NAME:  [name], YOUKHANA NO.:  [id number]  MEDICAL RECORD NO.:  [id number]           PATIENT TYPE:  LOCATION:                                 FACILITY:  PHYSICIAN:  Melvyn Novas, MDDATE OF BIRTH:  12-Jun-1976  DATE OF ADMISSION: DATE OF DISCHARGE:  LH                             HISTORY & PHYSICAL   HISTORY OF PRESENT ILLNESS:  The patient is a 34-year white male, discharged 5 days ago due to multilobar pneumonia, which was resolving quite nicely on vanco and Zosyn.  Discharged on Levaquin 250 p.o. daily and took it up until including today.  Presents to emergency room with some increasing dyspnea, found to have O2 sat of 80, which responded to oxygen and nebulizer treatment, and it came up above 92.  He does admit to increasing dyspnea.  He has been smoking half pack per day since discharge, chronically noncompliant.  He denies hemoptysis or green or yellow sputum production.  He is admitted.  Chest x-ray shows right lower lobe airspace disease, not sure if this worse over discharge, but he is admitted for hypoxia as a result of multilobar pneumonia.  He has also had accelerated hypertension, questionably compliant with multiple antihypertensive medicines.  Has chronic ischemic cardiomyopathy, two- vessel stenting with an ejection fraction of 35-40%.  PAST MEDICAL HISTORY:  Significant for; 1. Chronic renal failure, end-stage renal disease on dialysis. 2. Schizoaffective disorder. 3. Bipolar disorder. 4. Hypertension. 5. Hyperlipidemia. 6. Coronary artery disease. 7. COPD, continues smoking, chronic noncompliance. 8. Hyperlipidemia. 9. Ischemic cardiomyopathy, ejection fraction 35-40%. 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  PAST SURGICAL HISTORY:  Status post stenting x2 in coronary arteries.  ALLERGIES:  HE HAS NO SIGNIFICANT ALLERGIES.  CURRENT MEDICINES: 1. Levaquin 250 mg p.o. daily. 2. Labetalol 800 mg p.o. b.i.d. 3.  Aspirin 81 mg p.o. daily. 4. Calcium carbonate 500 mg 2 tablets p.o. b.i.d. 5. Carvedilol 12.5 mg p.o. b.i.d. 6. Gabapentin 300 mg p.o. t.i.d. 7. Lisinopril 40 mg p.o. every other day. 8. Renagel 800 mg 3 tablets p.o. t.i.d. 9. Trazodone 50 mg p.o. h.s. 10.Ambien 5 mg p.o. h.s. 11.Xanax 0.5 mg p.o. t.i.d. 12.Norvasc 10 mg p.o. daily. 13.Clonidine 0.1 mg p.o. t.i.d. 14.Crestor 20 mg p.o. daily. 15.Cymbalta 60 mg p.o. daily. 16.Hydralazine 50 mg p.o. t.i.d. 17.Plavix 75 mg p.o. daily. 18.Ropinirole 2 mg p.o. h.s. 19.Zyprexa 10 mg p.o. b.i.d.  PHYSICAL EXAMINATION:  VITAL SIGNS:  Blood pressure in the ER was 186/98, respiratory rate is 20.  He is afebrile, O2 sat was 92% on 4 L nasal O2. HEENT:  Eyes PERRLA.  Extraocular movements intact.  Sclerae clear. Conjunctivae pink to pale. NECK:  No JVD.  No carotid bruits.  No thyromegaly.  No thyroid bruits. LUNGS:  Diminished breath sounds at the bases.  No rales appreciable. No wheeze appreciable.  No rhonchi appreciable. HEART:  Regular rhythm.  A 1/6 aortic outflow murmur.  No S3, S4, or gallops.  No heaves, thrills, or rubs. ABDOMEN:  Soft and nontender.  Bowel sounds normoactive.  No guarding, rebound, or hepatosplenomegaly. EXTREMITIES:  Trace to 1+ pedal edema.  NEUROLOGIC:  Cranial nerves II through XII grossly intact.  The patient moves all 4 extremities.  Plantars are downgoing.  IMPRESSION: 1. Hypoxia due to unresolved pneumonia. 2. Chronic obstructive pulmonary disease. 3. Continue smoking cessation. 4. Chronic noncompliance. 5. Hypertension, 6. Coronary artery disease. 7. Ischemic cardiomyopathy. 8. End-stage renal disease on dialysis. 9. Anemia of chronic disease secondary to renal failure. 10.Schizoaffective disorder. 11.Bipolar disorder. 12.Depression and anxiety.  PLAN:  At present is to admit, place on nasal O2 at 3 L, and titrate to keep O2 sat above 90, DuoNeb nebulizer q.4 h. p.r.n.  Will continue Levaquin  250 p.o. daily.  Will consider resuming vanco and Zosyn if clinical course does not improve.  Resume all other antihypertensive medicines, smoking cessation.  The patient again counseled as to need for compliance, followup, and smoking cessation.  Offered Chantix and nicotine patches, which he refuses.  Dialysis tomorrow and monitor hemodynamics and blood pressure.     Melvyn Novas, MD     RMD/MEDQ  D:  02/09/2011  T:  02/10/2011  Job:  [id number]  Electronically Signed by Oval Linsey MD on 02/24/2011 07:24:27 AM

## 2011-02-24 NOTE — Discharge Summary (Signed)
  NAME:  [name], [name] NO.:  [id number]  MEDICAL RECORD NO.:  [id number]           PATIENT TYPE:  LOCATION:                                 FACILITY:  PHYSICIAN:  Melvyn Novas, MDDATE OF BIRTH:  21-Jun-1976  DATE OF ADMISSION: DATE OF DISCHARGE:  LH                              DISCHARGE SUMMARY   The patient is well known patient to [place] with chronic multiple medical problems, recently discharged, but returned to the hospital due to cough, hypoxia, and questionable multilobar infiltrate on chest x-ray which was totally now resolved.  He had been discharged on Levaquin 250 p.o. daily dosage likely due to chronic renal failure after vancomycin and Zosyn for 6 days in the hospital.  The patient continues to smoke, to be totally noncompliant with antihypertensives.  He has other medical problems including ischemic cardiomyopathy, diminished ejection fraction 35-40%, chronic renal failure dialysis which he cut short all the time, accelerated hypertension, hyperlipidemia, coronary artery disease status post stenting x2, schizoaffective disorder, bipolar disorder, chronic noncompliance, continued smoking cessation, COPD, anxiety, hyperlipidemia, and depression.  Essentially, the patient was placed on vancomycin and Zosyn given 3 episodes of dialysis.  His blood pressure was somewhat normalized.  He was resumed on all his antihypertensive medicines and after third dialysis he was threatening to sign out again AMA.  Felt it was better to at least write a prescription for oral antibiotics due to the patient's repeated signing out.  He will be discharged on a handwritten prescription of Levaquin 250 p.o. daily for 5 days additionally postdischarge.  His other discharge medicines include: 1. Xanax 0.25 mg p.o. t.i.d. 2. Aspirin 81 mg p.o. daily. 3. Calcium carbonate 1250 mg p.o. b.i.d. 4. Carvedilol 12.5 mg p.o. b.i.d. 5. Plavix 75 mg p.o. daily. 6.  Gabapentin 300 mg p.o. t.i.d. 7. Carvedilol 800 mg p.o. b.i.d. 8. Lisinopril 40 mg p.o. daily. 9. Zyprexa 10 mg p.o. b.i.d. 10.Crestor 20 mg p.o. daily. 11.Renagel 800 mg 3 tablets t.i.d. a.c. 12.Trazodone 50 mg p.o. daily. 13.Amlodipine 10 mg p.o. daily. 14.Clonidine 0.1 mg p.o. t.i.d. 15.Cymbalta 60 mg p.o. daily. 16.Hydralazine 50 mg p.o. t.i.d. 17.Ropinirole 2 mg p.o. at bedtime.  He will follow up in the office in 3 days' time to check his hypertension, heart failure, respiratory status, and pneumonia.     Melvyn Novas, MD     RMD/MEDQ  D:  02/12/2011  T:  02/12/2011  Job:  [id number]  Electronically Signed by Oval Linsey MD on 02/24/2011 07:24:23 AM

## 2011-02-24 NOTE — H&P (Signed)
  NAME:  [name], [name] NO.:  [id number]  MEDICAL RECORD NO.:  [id number]           PATIENT TYPE:  E  LOCATION:  [facility]                          FACILITY:  [facility]  PHYSICIAN:  Melvyn Novas, MDDATE OF BIRTH:  31-Oct-1976  DATE OF ADMISSION:  02/07/2011 DATE OF DISCHARGE:  02/20/2012LH                             HISTORY & PHYSICAL   The patient has had 3 ER visits for multiple somatic complaints in the last 24 hours after being discharged approximately 36 hours ago with Levaquin orally for residual infiltrate on chest x-ray.  He denies fever, chills, hemoptysis, cough.  He does complain of some dyspnea. His O2 sats are 90% in the ER.  His hemoglobin is 8.3 and his potassium is 5.7.  He is chronic renal dialysis patient, hypertension, ischemic cardiomyopathy.  Denies angina, anginal equivalents, palpitations, dizziness, syncope, or hemoptysis.  Lungs show scattered rhonchi, no rales, no wheeze appreciable, some diminished breath sounds at both bases.  He has no respiratory distress.  His blood pressure is 151/88, temperature is 98.1 orally, respirations are 22 as I count and pulse oximetry is 90% on ABG room air.  Basically the patient was told that he needs to have dialysis to monitor electrolytes and possible volume overload which is chronic.  He will then report to my office in the morning for arranging home O2 therapy and possible home nebulizer therapy to minimize his multiple times per day ER visits.  The patient seems to understand this.  I gave him a prescription for albuterol 2 puffs q.4 h. p.r.n.  He states he has albuterol at home and he took the prescription anyway and he was explained of the necessary reasons to follow up in dialysis and take his Levaquin 250 per day as well as all of his other medicines.  He is usually noncompliant with all medicines as he is in the hospital, refuses vancomycin and Zosyn for the last 48 hours in hospital.   There was no significant benefit from hospitalization in my opinion at this time for this patient.  We will reevaluate him in the a.m.     Melvyn Novas, MD     RMD/MEDQ  D:  02/13/2011  T:  02/14/2011  Job:  [id number]  Electronically Signed by Oval Linsey MD on 02/24/2011 07:20:32 AM

## 2011-02-24 NOTE — Discharge Summary (Signed)
NAME:  [name], [name]               ACCOUNT NO.:  [id number]  MEDICAL RECORD NO.:  [id number]           PATIENT TYPE:  I  LOCATION:  [facility]                          FACILITY:  [facility]  PHYSICIAN:  Melvyn Novas, MDDATE OF BIRTH:  1976-03-06  DATE OF ADMISSION:  01/26/2011 DATE OF DISCHARGE:  02/10/2012LH                              DISCHARGE SUMMARY   The patient is a 34-year white male with multiple extensive medical problems who signed himself out AMA 2 days prior to this subsequent admission who returned again with increasing cough, possible septicemia, infiltrates on chest x-ray, accelerated hypertension, congestive heart failure with elevated BNP.  He has chronic ischemic cardiomyopathy, two- vessel disease with stents placed, chronic end-stage renal disease, chronic COPD with continued smoking, polysubstance abuse, congestive heart failure, hyperlipidemia, bipolar disorder, chronic noncompliance with ejection fraction 35-40%, and schizoaffective disorder.  The patient was found to have these multiple infiltrates and chest x-ray placed on IV vancomycin and Zosyn.  He had dialysis.  His initial systolic blood pressure was 220 upon admission.  There was no evidence of myocardial infarction again on this admission.  He had aggressive nebulizer therapy in the form of DuoNeb, had multiple episodes of dialysis.  He had occasional VPC's during dialysis, which were terminated.  Today, he was seen in consultation by Cardiology. Magnesium and potassium were within normal limits.  This was felt to be due to metabolic disease with no evidence of ischemia.  A 12-lead EKG was within normal parameters.  He continued have five episodes of dialysis with vancomycin and Zosyn for 4-5 days.  Repeat chest x-ray showed significant improvement and consolidation of lung fields only unilaterally on subsequent chest x-ray.  He was subsequently changed over to Levaquin 250 p.o. daily for 5 days as  an outpatient.  The other changes in his medicine that is full labetalol was increased 800 mg p.o. b.i.d. from 400 p.o. b.i.d.  His other discharge medicines include aspirin 81 mg per day, calcium carbonate for 500 mg 2 tablets p.o. b.i.d., carvedilol 12.5 mg p.o. b.i.d., gabapentin 300 mg p.o. at bedtime an t.i.d., hydroxyzine 25 mg q.8 h. p.r.n., Levaquin 250 p.o. daily for 5 days additionally, labetalol 20 mg tablets 4 tablets p.o. b.i.d., lisinopril 40 mg p.o. daily, Renagel 800 mg 3 tablets p.o. t.i.d., trazodone 50 mg p.o. at bedtime, Ambien 5 mg p.o. at bedtime p.r.n., Xanax 0.5 mg p.o. t.i.d., Norvasc 10 mg p.o. daily, clonidine 0.1 mg p.o. t.i.d., Crestor 20 mg p.o. daily, Cymbalta 60 mg p.o. daily, hydralazine 50 mg p.o. t.i.d., Plavix 75 mg p.o. daily, ropinirole 2 mg p.o. at bedtime, and Zyprexa 10 mg p.o. b.i.d.  The patient has urged to follow up Tuesday in my office.  We are checking his lungs, his blood pressure, congestive heart failure.  He was hemodynamically stable on discharge. No evidence of ischemia, angina, or dyspnea.  His systolic pressures was 150 upon discharge.     Melvyn Novas, MD     RMD/MEDQ  D:  02/05/2011  T:  02/05/2011  Job:  [id number]  Electronically Signed by Oval Linsey MD on 02/24/2011  07:23:25 AM

## 2011-02-25 ENCOUNTER — Inpatient Hospital Stay (HOSPITAL_COMMUNITY): Payer: Medicare Other

## 2011-02-26 ENCOUNTER — Inpatient Hospital Stay (HOSPITAL_COMMUNITY): Payer: Medicare Other

## 2011-02-26 LAB — DIFFERENTIAL
Basophils Relative: 0 % (ref 0–1)
Lymphs Abs: 1.4 10*3/uL (ref 0.7–4.0)
Monocytes Relative: 8 % (ref 3–12)
Neutro Abs: 3.2 10*3/uL (ref 1.7–7.7)
Neutrophils Relative %: 61 % (ref 43–77)

## 2011-02-26 LAB — BASIC METABOLIC PANEL
CO2: 32 mEq/L (ref 19–32)
Chloride: 99 mEq/L (ref 96–112)
Creatinine, Ser: 4.3 mg/dL — ABNORMAL HIGH (ref 0.4–1.5)
GFR calc Af Amer: 19 mL/min — ABNORMAL LOW (ref >60)
Potassium: 3.7 mEq/L (ref 3.5–5.1)
Sodium: 139 mEq/L (ref 135–145)

## 2011-02-26 LAB — CBC
Hemoglobin: 8.8 g/dL — ABNORMAL LOW (ref 13.0–17.0)
MCH: 27.5 pg (ref 26.0–34.0)
Platelets: 264 10*3/uL (ref 150–400)
RBC: 3.2 MIL/uL — ABNORMAL LOW (ref 4.22–5.81)
WBC: 5.2 10*3/uL (ref 4.0–10.5)

## 2011-02-26 LAB — PHOSPHORUS: Phosphorus: 4.2 mg/dL (ref 2.3–4.6)

## 2011-02-27 ENCOUNTER — Inpatient Hospital Stay (HOSPITAL_COMMUNITY): Payer: Medicare Other

## 2011-02-27 LAB — CBC
HCT: 25.4 % — ABNORMAL LOW (ref 39.0–52.0)
Hemoglobin: 8 g/dL — ABNORMAL LOW (ref 13.0–17.0)
MCH: 27.4 pg (ref 26.0–34.0)
MCHC: 31.5 g/dL (ref 30.0–36.0)

## 2011-02-27 LAB — BASIC METABOLIC PANEL
CO2: 30 mEq/L (ref 19–32)
Calcium: 9.2 mg/dL (ref 8.4–10.5)
Creatinine, Ser: 3.99 mg/dL — ABNORMAL HIGH (ref 0.4–1.5)
Glucose, Bld: 103 mg/dL — ABNORMAL HIGH (ref 70–99)
Sodium: 137 mEq/L (ref 135–145)

## 2011-02-27 LAB — DIFFERENTIAL
Lymphocytes Relative: 30 % (ref 12–46)
Lymphs Abs: 1.3 10*3/uL (ref 0.7–4.0)
Monocytes Absolute: 0.4 10*3/uL (ref 0.1–1.0)
Monocytes Relative: 9 % (ref 3–12)
Neutro Abs: 2.5 10*3/uL (ref 1.7–7.7)

## 2011-02-28 ENCOUNTER — Inpatient Hospital Stay (HOSPITAL_COMMUNITY): Payer: Medicare Other

## 2011-02-28 ENCOUNTER — Emergency Department (HOSPITAL_COMMUNITY): Payer: Medicare Other

## 2011-02-28 ENCOUNTER — Emergency Department (HOSPITAL_COMMUNITY)
Admission: EM | Admit: 2011-02-28 | Discharge: 2011-03-01 | Disposition: A | Discharge status: Home / Self Care | Payer: Medicare Other | Source: Home / Self Care | Attending: Emergency Medicine | Admitting: Emergency Medicine

## 2011-02-28 DIAGNOSIS — I120 Hypertensive chronic kidney disease with stage 5 chronic kidney disease or end stage renal disease: Secondary | ICD-10-CM | POA: Insufficient documentation

## 2011-02-28 DIAGNOSIS — D649 Anemia, unspecified: Secondary | ICD-10-CM | POA: Insufficient documentation

## 2011-02-28 DIAGNOSIS — J449 Chronic obstructive pulmonary disease, unspecified: Secondary | ICD-10-CM | POA: Insufficient documentation

## 2011-02-28 DIAGNOSIS — R109 Unspecified abdominal pain: Secondary | ICD-10-CM | POA: Insufficient documentation

## 2011-02-28 DIAGNOSIS — N186 End stage renal disease: Secondary | ICD-10-CM | POA: Insufficient documentation

## 2011-02-28 DIAGNOSIS — F341 Dysthymic disorder: Secondary | ICD-10-CM | POA: Insufficient documentation

## 2011-02-28 DIAGNOSIS — I509 Heart failure, unspecified: Secondary | ICD-10-CM | POA: Insufficient documentation

## 2011-02-28 DIAGNOSIS — I2510 Atherosclerotic heart disease of native coronary artery without angina pectoris: Secondary | ICD-10-CM | POA: Insufficient documentation

## 2011-02-28 DIAGNOSIS — J4489 Other specified chronic obstructive pulmonary disease: Secondary | ICD-10-CM | POA: Insufficient documentation

## 2011-02-28 DIAGNOSIS — R10819 Abdominal tenderness, unspecified site: Secondary | ICD-10-CM | POA: Insufficient documentation

## 2011-02-28 DIAGNOSIS — R110 Nausea: Secondary | ICD-10-CM | POA: Insufficient documentation

## 2011-02-28 HISTORY — DX: Essential (primary) hypertension: I10

## 2011-02-28 LAB — BLOOD GAS, ARTERIAL
Acid-Base Excess: 0.3 mmol/L (ref 0.0–2.0)
FIO2: 100 %
TCO2: 22.4 mmol/L (ref 0–100)
pCO2 arterial: 37.6 mmHg (ref 35.0–45.0)
pH, Arterial: 7.424 (ref 7.350–7.450)
pO2, Arterial: 90.2 mmHg (ref 80.0–100.0)

## 2011-02-28 LAB — CARDIAC PANEL(CRET KIN+CKTOT+MB+TROPI)
Relative Index: 1.7 (ref 0.0–2.5)
Relative Index: 1.8 (ref 0.0–2.5)
Relative Index: 1.8 (ref 0.0–2.5)
Troponin I: 0.09 ng/mL — ABNORMAL HIGH (ref 0.00–0.06)
Troponin I: 0.09 ng/mL — ABNORMAL HIGH (ref 0.00–0.06)

## 2011-02-28 LAB — COMPREHENSIVE METABOLIC PANEL
ALT: 9 U/L (ref 0–53)
ALT: 11 U/L (ref 0–53)
AST: 12 U/L (ref 0–37)
CO2: 22 mEq/L (ref 19–32)
Calcium: 10.1 mg/dL (ref 8.4–10.5)
Calcium: 10.6 mg/dL — ABNORMAL HIGH (ref 8.4–10.5)
Creatinine, Ser: 9.16 mg/dL — ABNORMAL HIGH (ref 0.4–1.5)
Creatinine, Ser: 11.55 mg/dL — ABNORMAL HIGH (ref 0.4–1.5)
GFR calc Af Amer: 8 mL/min — ABNORMAL LOW (ref >60)
GFR calc non Af Amer: 5 mL/min — ABNORMAL LOW (ref >60)
Glucose, Bld: 91 mg/dL (ref 70–99)
Sodium: 137 mEq/L (ref 135–145)
Total Bilirubin: 0.5 mg/dL (ref 0.3–1.2)
Total Protein: 6.9 g/dL (ref 6.0–8.3)

## 2011-02-28 LAB — DIFFERENTIAL
Basophils Absolute: 0 10*3/uL (ref 0.0–0.1)
Basophils Absolute: 0 10*3/uL (ref 0.0–0.1)
Eosinophils Absolute: 0.1 10*3/uL (ref 0.0–0.7)
Eosinophils Absolute: 0.2 10*3/uL (ref 0.0–0.7)
Eosinophils Relative: 6 % — ABNORMAL HIGH (ref 0–5)
Eosinophils Relative: 2 % (ref 0–5)
Lymphocytes Relative: 30 % (ref 12–46)
Lymphocytes Relative: 13 % (ref 12–46)
Lymphocytes Relative: 27 % (ref 12–46)
Lymphs Abs: 0.9 10*3/uL (ref 0.7–4.0)
Lymphs Abs: 1.4 10*3/uL (ref 0.7–4.0)
Monocytes Absolute: 0.4 10*3/uL (ref 0.1–1.0)
Monocytes Relative: 5 % (ref 3–12)
Neutrophils Relative %: 59 % (ref 43–77)

## 2011-02-28 LAB — BASIC METABOLIC PANEL
BUN: 28 mg/dL — ABNORMAL HIGH (ref 6–23)
BUN: 11 mg/dL (ref 6–23)
BUN: 41 mg/dL — ABNORMAL HIGH (ref 6–23)
CO2: 29 mEq/L (ref 19–32)
CO2: 29 mEq/L (ref 19–32)
CO2: 30 mEq/L (ref 19–32)
Calcium: 9.9 mg/dL (ref 8.4–10.5)
Calcium: 10 mg/dL (ref 8.4–10.5)
Chloride: 100 mEq/L (ref 96–112)
Chloride: 93 mEq/L — ABNORMAL LOW (ref 96–112)
Creatinine, Ser: 5.91 mg/dL — ABNORMAL HIGH (ref 0.4–1.5)
Creatinine, Ser: 10.05 mg/dL — ABNORMAL HIGH (ref 0.4–1.5)
Creatinine, Ser: 5.16 mg/dL — ABNORMAL HIGH (ref 0.4–1.5)
GFR calc Af Amer: 15 mL/min — ABNORMAL LOW (ref >60)
GFR calc non Af Amer: 11 mL/min — ABNORMAL LOW (ref >60)
GFR calc non Af Amer: 6 mL/min — ABNORMAL LOW (ref >60)
Glucose, Bld: 122 mg/dL — ABNORMAL HIGH (ref 70–99)
Glucose, Bld: 93 mg/dL (ref 70–99)
Glucose, Bld: 96 mg/dL (ref 70–99)
Glucose, Bld: 97 mg/dL (ref 70–99)
Potassium: 4.1 mEq/L (ref 3.5–5.1)
Potassium: 4.3 mEq/L (ref 3.5–5.1)
Potassium: 5.8 mEq/L — ABNORMAL HIGH (ref 3.5–5.1)
Sodium: 136 mEq/L (ref 135–145)
Sodium: 134 mEq/L — ABNORMAL LOW (ref 135–145)
Sodium: 137 mEq/L (ref 135–145)

## 2011-02-28 LAB — GLUCOSE, CAPILLARY: Glucose-Capillary: 106 mg/dL — ABNORMAL HIGH (ref 70–99)

## 2011-02-28 LAB — LIPASE, BLOOD: Lipase: 32 U/L (ref 11–59)

## 2011-02-28 LAB — CBC
HCT: 25.8 % — ABNORMAL LOW (ref 39.0–52.0)
HCT: 28.1 % — ABNORMAL LOW (ref 39.0–52.0)
Hemoglobin: 9.2 g/dL — ABNORMAL LOW (ref 13.0–17.0)
Hemoglobin: 9.6 g/dL — ABNORMAL LOW (ref 13.0–17.0)
MCH: 27.5 pg (ref 26.0–34.0)
MCH: 28.9 pg (ref 26.0–34.0)
MCHC: 31.8 g/dL (ref 30.0–36.0)
MCHC: 33.8 g/dL (ref 30.0–36.0)
MCHC: 34.2 g/dL (ref 30.0–36.0)
Platelets: 235 10*3/uL (ref 150–400)
RDW: 16.6 % — ABNORMAL HIGH (ref 11.5–15.5)
RDW: 17.4 % — ABNORMAL HIGH (ref 11.5–15.5)

## 2011-02-28 LAB — MRSA PCR SCREENING: MRSA by PCR: NEGATIVE

## 2011-02-28 LAB — POCT CARDIAC MARKERS: Myoglobin, poc: >500 ng/mL (ref 12–200)

## 2011-03-01 ENCOUNTER — Encounter (HOSPITAL_COMMUNITY): Payer: Self-pay | Admitting: Radiology

## 2011-03-01 ENCOUNTER — Encounter: Payer: Self-pay | Admitting: Urgent Care

## 2011-03-01 LAB — POCT CARDIAC MARKERS
CKMB, poc: <1 ng/mL — ABNORMAL LOW (ref 1.0–8.0)
Myoglobin, poc: >500 ng/mL (ref 12–200)
Myoglobin, poc: 410 ng/mL (ref 12–200)
Myoglobin, poc: >500 ng/mL (ref 12–200)
Troponin i, poc: <0.05 ng/mL (ref 0.00–0.09)
Troponin i, poc: <0.05 ng/mL (ref 0.00–0.09)

## 2011-03-01 LAB — CBC
HCT: 26.2 % — ABNORMAL LOW (ref 39.0–52.0)
HCT: 25.2 % — ABNORMAL LOW (ref 39.0–52.0)
HCT: 25.6 % — ABNORMAL LOW (ref 39.0–52.0)
HCT: 26.2 % — ABNORMAL LOW (ref 39.0–52.0)
HCT: 26.3 % — ABNORMAL LOW (ref 39.0–52.0)
HCT: 28 % — ABNORMAL LOW (ref 39.0–52.0)
HCT: 29.4 % — ABNORMAL LOW (ref 39.0–52.0)
HCT: 30.2 % — ABNORMAL LOW (ref 39.0–52.0)
Hemoglobin: 8.7 g/dL — ABNORMAL LOW (ref 13.0–17.0)
Hemoglobin: 10.1 g/dL — ABNORMAL LOW (ref 13.0–17.0)
Hemoglobin: 8.6 g/dL — ABNORMAL LOW (ref 13.0–17.0)
Hemoglobin: 9 g/dL — ABNORMAL LOW (ref 13.0–17.0)
Hemoglobin: 9 g/dL — ABNORMAL LOW (ref 13.0–17.0)
Hemoglobin: 9.4 g/dL — ABNORMAL LOW (ref 13.0–17.0)
MCH: 28.2 pg (ref 26.0–34.0)
MCH: 29.9 pg (ref 26.0–34.0)
MCH: 30.1 pg (ref 26.0–34.0)
MCHC: 33.2 g/dL (ref 30.0–36.0)
MCHC: 33.6 g/dL (ref 30.0–36.0)
MCHC: 34.2 g/dL (ref 30.0–36.0)
MCHC: 34.3 g/dL (ref 30.0–36.0)
MCHC: 35.1 g/dL (ref 30.0–36.0)
MCHC: 35.2 g/dL (ref 30.0–36.0)
MCV: 85.1 fL (ref 78.0–100.0)
MCV: 87.3 fL (ref 78.0–100.0)
MCV: 87.5 fL (ref 78.0–100.0)
MCV: 88.8 fL (ref 78.0–100.0)
Platelets: 253 10*3/uL (ref 150–400)
Platelets: 196 10*3/uL (ref 150–400)
Platelets: 241 10*3/uL (ref 150–400)
RBC: 3.08 MIL/uL — ABNORMAL LOW (ref 4.22–5.81)
RBC: 2.84 MIL/uL — ABNORMAL LOW (ref 4.22–5.81)
RBC: 3.01 MIL/uL — ABNORMAL LOW (ref 4.22–5.81)
RDW: 16.8 % — ABNORMAL HIGH (ref 11.5–15.5)
RDW: 16.5 % — ABNORMAL HIGH (ref 11.5–15.5)
RDW: 16.5 % — ABNORMAL HIGH (ref 11.5–15.5)
RDW: 17 % — ABNORMAL HIGH (ref 11.5–15.5)
RDW: 17.2 % — ABNORMAL HIGH (ref 11.5–15.5)
RDW: 17.9 % — ABNORMAL HIGH (ref 11.5–15.5)
RDW: 18.6 % — ABNORMAL HIGH (ref 11.5–15.5)
WBC: 7 10*3/uL (ref 4.0–10.5)
WBC: 3.3 10*3/uL — ABNORMAL LOW (ref 4.0–10.5)
WBC: 4.7 10*3/uL (ref 4.0–10.5)
WBC: 4.9 10*3/uL (ref 4.0–10.5)
WBC: 5.9 10*3/uL (ref 4.0–10.5)

## 2011-03-01 LAB — DIFFERENTIAL
Basophils Absolute: 0 10*3/uL (ref 0.0–0.1)
Basophils Absolute: 0 10*3/uL (ref 0.0–0.1)
Basophils Absolute: 0 10*3/uL (ref 0.0–0.1)
Basophils Absolute: 0 10*3/uL (ref 0.0–0.1)
Basophils Absolute: 0 10*3/uL (ref 0.0–0.1)
Basophils Absolute: 0.1 10*3/uL (ref 0.0–0.1)
Basophils Relative: 0 % (ref 0–1)
Basophils Relative: 0 % (ref 0–1)
Basophils Relative: 0 % (ref 0–1)
Basophils Relative: 0 % (ref 0–1)
Basophils Relative: 0 % (ref 0–1)
Basophils Relative: 2 % — ABNORMAL HIGH (ref 0–1)
Eosinophils Absolute: 0 10*3/uL (ref 0.0–0.7)
Eosinophils Absolute: 0.1 10*3/uL (ref 0.0–0.7)
Eosinophils Absolute: 0.1 10*3/uL (ref 0.0–0.7)
Eosinophils Absolute: 0.2 10*3/uL (ref 0.0–0.7)
Eosinophils Relative: 4 % (ref 0–5)
Eosinophils Relative: 1 % (ref 0–5)
Eosinophils Relative: 2 % (ref 0–5)
Eosinophils Relative: 2 % (ref 0–5)
Lymphocytes Relative: 21 % (ref 12–46)
Lymphocytes Relative: 12 % (ref 12–46)
Lymphocytes Relative: 20 % (ref 12–46)
Lymphocytes Relative: 24 % (ref 12–46)
Lymphocytes Relative: 27 % (ref 12–46)
Lymphocytes Relative: 27 % (ref 12–46)
Lymphs Abs: 0.7 10*3/uL (ref 0.7–4.0)
Lymphs Abs: 1.2 10*3/uL (ref 0.7–4.0)
Monocytes Absolute: 0.5 10*3/uL (ref 0.1–1.0)
Monocytes Absolute: 0.3 10*3/uL (ref 0.1–1.0)
Monocytes Absolute: 0.3 10*3/uL (ref 0.1–1.0)
Monocytes Absolute: 0.4 10*3/uL (ref 0.1–1.0)
Monocytes Absolute: 0.5 10*3/uL (ref 0.1–1.0)
Monocytes Absolute: 0.6 10*3/uL (ref 0.1–1.0)
Monocytes Relative: 7 % (ref 3–12)
Monocytes Relative: 13 % — ABNORMAL HIGH (ref 3–12)
Monocytes Relative: 5 % (ref 3–12)
Monocytes Relative: 9 % (ref 3–12)
Monocytes Relative: 9 % (ref 3–12)
Neutro Abs: 2.1 10*3/uL (ref 1.7–7.7)
Neutro Abs: 3.2 10*3/uL (ref 1.7–7.7)
Neutro Abs: 3.3 10*3/uL (ref 1.7–7.7)
Neutro Abs: 4 10*3/uL (ref 1.7–7.7)
Neutro Abs: 4.8 10*3/uL (ref 1.7–7.7)
Neutrophils Relative %: 56 % (ref 43–77)
Neutrophils Relative %: 58 % (ref 43–77)
Neutrophils Relative %: 67 % (ref 43–77)
Neutrophils Relative %: 67 % (ref 43–77)
Neutrophils Relative %: 81 % — ABNORMAL HIGH (ref 43–77)

## 2011-03-01 LAB — COMPREHENSIVE METABOLIC PANEL
ALT: 10 U/L (ref 0–53)
ALT: 10 U/L (ref 0–53)
ALT: 11 U/L (ref 0–53)
Alkaline Phosphatase: 117 U/L (ref 39–117)
Alkaline Phosphatase: 92 U/L (ref 39–117)
BUN: 20 mg/dL (ref 6–23)
BUN: 26 mg/dL — ABNORMAL HIGH (ref 6–23)
CO2: 23 mEq/L (ref 19–32)
CO2: 26 mEq/L (ref 19–32)
Calcium: 10 mg/dL (ref 8.4–10.5)
Calcium: 9.1 mg/dL (ref 8.4–10.5)
Chloride: 94 mEq/L — ABNORMAL LOW (ref 96–112)
Chloride: 97 mEq/L (ref 96–112)
Creatinine, Ser: 6.26 mg/dL — ABNORMAL HIGH (ref 0.4–1.5)
GFR calc non Af Amer: 10 mL/min — ABNORMAL LOW (ref >60)
GFR calc non Af Amer: 15 mL/min — ABNORMAL LOW (ref >60)
Glucose, Bld: 101 mg/dL — ABNORMAL HIGH (ref 70–99)
Glucose, Bld: 102 mg/dL — ABNORMAL HIGH (ref 70–99)
Glucose, Bld: 103 mg/dL — ABNORMAL HIGH (ref 70–99)
Glucose, Bld: 84 mg/dL (ref 70–99)
Potassium: 3.5 mEq/L (ref 3.5–5.1)
Potassium: 3.9 mEq/L (ref 3.5–5.1)
Sodium: 127 mEq/L — ABNORMAL LOW (ref 135–145)
Sodium: 132 mEq/L — ABNORMAL LOW (ref 135–145)
Sodium: 133 mEq/L — ABNORMAL LOW (ref 135–145)
Sodium: 135 mEq/L (ref 135–145)
Total Bilirubin: 0.5 mg/dL (ref 0.3–1.2)
Total Bilirubin: 0.7 mg/dL (ref 0.3–1.2)
Total Protein: 6.2 g/dL (ref 6.0–8.3)
Total Protein: 6.9 g/dL (ref 6.0–8.3)
Total Protein: 7.1 g/dL (ref 6.0–8.3)
Total Protein: 7.2 g/dL (ref 6.0–8.3)

## 2011-03-01 LAB — COMPREHENSIVE METABOLIC PANEL WITH GFR
ALT: 11 U/L (ref 0–53)
AST: 14 U/L (ref 0–37)
Albumin: 3.6 g/dL (ref 3.5–5.2)
Alkaline Phosphatase: 107 U/L (ref 39–117)
BUN: 28 mg/dL — ABNORMAL HIGH (ref 6–23)
CO2: 28 meq/L (ref 19–32)
Calcium: 10.2 mg/dL (ref 8.4–10.5)
Chloride: 92 meq/L — ABNORMAL LOW (ref 96–112)
Creatinine, Ser: 5.46 mg/dL — ABNORMAL HIGH (ref 0.4–1.5)
GFR calc non Af Amer: 12 mL/min — ABNORMAL LOW
Glucose, Bld: 93 mg/dL (ref 70–99)
Potassium: 4.7 meq/L (ref 3.5–5.1)
Sodium: 134 meq/L — ABNORMAL LOW (ref 135–145)
Total Bilirubin: 0.7 mg/dL (ref 0.3–1.2)
Total Protein: 6.8 g/dL (ref 6.0–8.3)

## 2011-03-01 LAB — PROTIME-INR: Prothrombin Time: 12.8 seconds (ref 11.6–15.2)

## 2011-03-01 LAB — BASIC METABOLIC PANEL
BUN: 18 mg/dL (ref 6–23)
BUN: 39 mg/dL — ABNORMAL HIGH (ref 6–23)
Calcium: 9.7 mg/dL (ref 8.4–10.5)
Calcium: 9.7 mg/dL (ref 8.4–10.5)
Chloride: 93 mEq/L — ABNORMAL LOW (ref 96–112)
GFR calc non Af Amer: 6 mL/min — ABNORMAL LOW (ref >60)
GFR calc non Af Amer: 6 mL/min — ABNORMAL LOW (ref >60)
Glucose, Bld: 135 mg/dL — ABNORMAL HIGH (ref 70–99)
Potassium: 3.1 mEq/L — ABNORMAL LOW (ref 3.5–5.1)
Potassium: 4.4 mEq/L (ref 3.5–5.1)
Potassium: 4.5 mEq/L (ref 3.5–5.1)
Sodium: 128 mEq/L — ABNORMAL LOW (ref 135–145)
Sodium: 133 mEq/L — ABNORMAL LOW (ref 135–145)

## 2011-03-01 LAB — HEPATIC FUNCTION PANEL
ALT: 8 U/L (ref 0–53)
AST: 14 U/L (ref 0–37)
Albumin: 4 g/dL (ref 3.5–5.2)
Total Protein: 7 g/dL (ref 6.0–8.3)

## 2011-03-01 LAB — BRAIN NATRIURETIC PEPTIDE
Pro B Natriuretic peptide (BNP): 2150 pg/mL — ABNORMAL HIGH (ref 0.0–100.0)
Pro B Natriuretic peptide (BNP): >3200 pg/mL — ABNORMAL HIGH (ref 0.0–100.0)

## 2011-03-01 LAB — LIPASE, BLOOD
Lipase: 21 U/L (ref 11–59)
Lipase: 27 U/L (ref 11–59)
Lipase: 30 U/L (ref 11–59)

## 2011-03-01 LAB — HEPATITIS B SURFACE ANTIGEN: Hepatitis B Surface Ag: NEGATIVE

## 2011-03-01 LAB — CARDIAC PANEL(CRET KIN+CKTOT+MB+TROPI): CK, MB: 1.5 ng/mL (ref 0.3–4.0)

## 2011-03-01 NOTE — Medication Information (Signed)
Summary: vicodin rx  vicodin rx   Imported By: Hendricks Limes LPN 71/69/6789 38:10:17  _____________________________________________________________________  External Attachment:    Type:   Image     Comment:   External Document

## 2011-03-01 NOTE — Medication Information (Signed)
Summary: PA for pantoprazole  PA for pantoprazole   Imported By: Hendricks Limes LPN 21/30/8657 84:69:[id number]  _____________________________________________________________________  External Attachment:    Type:   Image     Comment:   External Document

## 2011-03-02 LAB — HEPATIC FUNCTION PANEL
ALT: 13 U/L (ref 0–53)
AST: 19 U/L (ref 0–37)
Albumin: 3.7 g/dL (ref 3.5–5.2)
Alkaline Phosphatase: 94 U/L (ref 39–117)
Alkaline Phosphatase: 93 U/L (ref 39–117)
Total Bilirubin: 0.9 mg/dL (ref 0.3–1.2)
Total Protein: 6.7 g/dL (ref 6.0–8.3)
Total Protein: 6.2 g/dL (ref 6.0–8.3)

## 2011-03-02 LAB — DIFFERENTIAL
Basophils Absolute: 0 10*3/uL (ref 0.0–0.1)
Basophils Absolute: 0 10*3/uL (ref 0.0–0.1)
Basophils Absolute: 0.1 10*3/uL (ref 0.0–0.1)
Basophils Relative: 0 % (ref 0–1)
Basophils Relative: 0 % (ref 0–1)
Basophils Relative: 0 % (ref 0–1)
Eosinophils Absolute: 0 10*3/uL (ref 0.0–0.7)
Eosinophils Absolute: 0 10*3/uL (ref 0.0–0.7)
Eosinophils Absolute: 0.1 10*3/uL (ref 0.0–0.7)
Eosinophils Absolute: 0.2 10*3/uL (ref 0.0–0.7)
Eosinophils Absolute: 0 10*3/uL (ref 0.0–0.7)
Eosinophils Absolute: 0 10*3/uL (ref 0.0–0.7)
Eosinophils Absolute: 0.1 10*3/uL (ref 0.0–0.7)
Eosinophils Absolute: 0.2 10*3/uL (ref 0.0–0.7)
Eosinophils Absolute: 0.3 10*3/uL (ref 0.0–0.7)
Eosinophils Relative: 0 % (ref 0–5)
Eosinophils Relative: 2 % (ref 0–5)
Eosinophils Relative: 3 % (ref 0–5)
Eosinophils Relative: 0 % (ref 0–5)
Eosinophils Relative: 1 % (ref 0–5)
Eosinophils Relative: 3 % (ref 0–5)
Eosinophils Relative: 3 % (ref 0–5)
Lymphocytes Relative: 22 % (ref 12–46)
Lymphocytes Relative: 28 % (ref 12–46)
Lymphocytes Relative: 13 % (ref 12–46)
Lymphocytes Relative: 19 % (ref 12–46)
Lymphocytes Relative: 20 % (ref 12–46)
Lymphs Abs: 0.5 10*3/uL — ABNORMAL LOW (ref 0.7–4.0)
Lymphs Abs: 1.4 10*3/uL (ref 0.7–4.0)
Lymphs Abs: 1.4 10*3/uL (ref 0.7–4.0)
Lymphs Abs: 0.6 10*3/uL — ABNORMAL LOW (ref 0.7–4.0)
Lymphs Abs: 1.4 10*3/uL (ref 0.7–4.0)
Lymphs Abs: 1.8 10*3/uL (ref 0.7–4.0)
Lymphs Abs: 2.3 10*3/uL (ref 0.7–4.0)
Monocytes Absolute: 0.4 10*3/uL (ref 0.1–1.0)
Monocytes Absolute: 0.4 10*3/uL (ref 0.1–1.0)
Monocytes Absolute: 0.5 10*3/uL (ref 0.1–1.0)
Monocytes Absolute: 0.4 10*3/uL (ref 0.1–1.0)
Monocytes Relative: 7 % (ref 3–12)
Monocytes Relative: 8 % (ref 3–12)
Monocytes Relative: 9 % (ref 3–12)
Monocytes Relative: 11 % (ref 3–12)
Monocytes Relative: 8 % (ref 3–12)
Monocytes Relative: 8 % (ref 3–12)
Neutrophils Relative %: 69 % (ref 43–77)
Neutrophils Relative %: 85 % — ABNORMAL HIGH (ref 43–77)
Neutrophils Relative %: 68 % (ref 43–77)
Neutrophils Relative %: 71 % (ref 43–77)

## 2011-03-02 LAB — BASIC METABOLIC PANEL
BUN: 21 mg/dL (ref 6–23)
BUN: 22 mg/dL (ref 6–23)
BUN: 30 mg/dL — ABNORMAL HIGH (ref 6–23)
BUN: 32 mg/dL — ABNORMAL HIGH (ref 6–23)
BUN: 49 mg/dL — ABNORMAL HIGH (ref 6–23)
CO2: 21 mEq/L (ref 19–32)
CO2: 21 mEq/L (ref 19–32)
CO2: 19 mEq/L (ref 19–32)
CO2: 21 mEq/L (ref 19–32)
CO2: 23 mEq/L (ref 19–32)
CO2: 23 mEq/L (ref 19–32)
Calcium: 9.4 mg/dL (ref 8.4–10.5)
Calcium: 8.9 mg/dL (ref 8.4–10.5)
Calcium: 9 mg/dL (ref 8.4–10.5)
Calcium: 9.6 mg/dL (ref 8.4–10.5)
Chloride: 100 mEq/L (ref 96–112)
Chloride: 96 mEq/L (ref 96–112)
Chloride: 91 mEq/L — ABNORMAL LOW (ref 96–112)
Chloride: 91 mEq/L — ABNORMAL LOW (ref 96–112)
Chloride: 91 mEq/L — ABNORMAL LOW (ref 96–112)
Chloride: 92 mEq/L — ABNORMAL LOW (ref 96–112)
Chloride: 93 mEq/L — ABNORMAL LOW (ref 96–112)
Creatinine, Ser: 6.1 mg/dL — ABNORMAL HIGH (ref 0.4–1.5)
Creatinine, Ser: 7.85 mg/dL — ABNORMAL HIGH (ref 0.4–1.5)
Creatinine, Ser: 5.22 mg/dL — ABNORMAL HIGH (ref 0.4–1.5)
Creatinine, Ser: 5.98 mg/dL — ABNORMAL HIGH (ref 0.4–1.5)
Creatinine, Ser: 6.83 mg/dL — ABNORMAL HIGH (ref 0.4–1.5)
Creatinine, Ser: 6.9 mg/dL — ABNORMAL HIGH (ref 0.4–1.5)
Creatinine, Ser: 8.62 mg/dL — ABNORMAL HIGH (ref 0.4–1.5)
GFR calc Af Amer: 11 mL/min — ABNORMAL LOW (ref >60)
GFR calc Af Amer: 13 mL/min — ABNORMAL LOW (ref >60)
GFR calc Af Amer: 13 mL/min — ABNORMAL LOW (ref >60)
GFR calc Af Amer: 15 mL/min — ABNORMAL LOW (ref >60)
GFR calc non Af Amer: 10 mL/min — ABNORMAL LOW (ref >60)
GFR calc non Af Amer: 9 mL/min — ABNORMAL LOW (ref >60)
Glucose, Bld: 96 mg/dL (ref 70–99)
Glucose, Bld: 96 mg/dL (ref 70–99)
Glucose, Bld: 186 mg/dL — ABNORMAL HIGH (ref 70–99)
Glucose, Bld: 92 mg/dL (ref 70–99)
Potassium: 4 mEq/L (ref 3.5–5.1)
Potassium: 4.4 mEq/L (ref 3.5–5.1)
Potassium: 6.4 mEq/L (ref 3.5–5.1)
Sodium: 133 mEq/L — ABNORMAL LOW (ref 135–145)
Sodium: 129 mEq/L — ABNORMAL LOW (ref 135–145)
Sodium: 131 mEq/L — ABNORMAL LOW (ref 135–145)

## 2011-03-02 LAB — COMPREHENSIVE METABOLIC PANEL
ALT: 10 U/L (ref 0–53)
ALT: 11 U/L (ref 0–53)
ALT: 12 U/L (ref 0–53)
AST: 12 U/L (ref 0–37)
AST: 18 U/L (ref 0–37)
AST: 15 U/L (ref 0–37)
Albumin: 3.3 g/dL — ABNORMAL LOW (ref 3.5–5.2)
Albumin: 3.6 g/dL (ref 3.5–5.2)
CO2: 22 mEq/L (ref 19–32)
CO2: 27 mEq/L (ref 19–32)
Calcium: 9.2 mg/dL (ref 8.4–10.5)
Calcium: 9 mg/dL (ref 8.4–10.5)
Chloride: 100 mEq/L (ref 96–112)
Creatinine, Ser: 4.79 mg/dL — ABNORMAL HIGH (ref 0.4–1.5)
Creatinine, Ser: 6.12 mg/dL — ABNORMAL HIGH (ref 0.4–1.5)
GFR calc Af Amer: 13 mL/min — ABNORMAL LOW (ref >60)
GFR calc Af Amer: 17 mL/min — ABNORMAL LOW (ref >60)
GFR calc Af Amer: 22 mL/min — ABNORMAL LOW (ref >60)
Potassium: 3.7 mEq/L (ref 3.5–5.1)
Sodium: 130 mEq/L — ABNORMAL LOW (ref 135–145)
Sodium: 139 mEq/L (ref 135–145)
Sodium: 131 mEq/L — ABNORMAL LOW (ref 135–145)
Total Bilirubin: 0.9 mg/dL (ref 0.3–1.2)
Total Protein: 6.3 g/dL (ref 6.0–8.3)
Total Protein: 6.4 g/dL (ref 6.0–8.3)

## 2011-03-02 LAB — RENAL FUNCTION PANEL
Albumin: 3.5 g/dL (ref 3.5–5.2)
BUN: 51 mg/dL — ABNORMAL HIGH (ref 6–23)
Calcium: 9.2 mg/dL (ref 8.4–10.5)
Creatinine, Ser: 7.79 mg/dL — ABNORMAL HIGH (ref 0.4–1.5)
Glucose, Bld: 142 mg/dL — ABNORMAL HIGH (ref 70–99)
Phosphorus: 6.1 mg/dL — ABNORMAL HIGH (ref 2.3–4.6)

## 2011-03-02 LAB — CULTURE, BLOOD (ROUTINE X 2)
Culture: NO GROWTH
Culture: NO GROWTH

## 2011-03-02 LAB — CBC
HCT: 25.1 % — ABNORMAL LOW (ref 39.0–52.0)
HCT: 30.7 % — ABNORMAL LOW (ref 39.0–52.0)
HCT: 26.2 % — ABNORMAL LOW (ref 39.0–52.0)
HCT: 27.1 % — ABNORMAL LOW (ref 39.0–52.0)
Hemoglobin: 10.3 g/dL — ABNORMAL LOW (ref 13.0–17.0)
Hemoglobin: 8.7 g/dL — ABNORMAL LOW (ref 13.0–17.0)
Hemoglobin: 9 g/dL — ABNORMAL LOW (ref 13.0–17.0)
Hemoglobin: 10.1 g/dL — ABNORMAL LOW (ref 13.0–17.0)
Hemoglobin: 9.5 g/dL — ABNORMAL LOW (ref 13.0–17.0)
Hemoglobin: 9.6 g/dL — ABNORMAL LOW (ref 13.0–17.0)
MCH: 30.2 pg (ref 26.0–34.0)
MCH: 30.2 pg (ref 26.0–34.0)
MCH: 30.8 pg (ref 26.0–34.0)
MCH: 30.2 pg (ref 26.0–34.0)
MCH: 30.5 pg (ref 26.0–34.0)
MCH: 30.6 pg (ref 26.0–34.0)
MCH: 30.7 pg (ref 26.0–34.0)
MCHC: 33.7 g/dL (ref 30.0–36.0)
MCHC: 34.1 g/dL (ref 30.0–36.0)
MCHC: 34.3 g/dL (ref 30.0–36.0)
MCHC: 34.4 g/dL (ref 30.0–36.0)
MCHC: 34.1 g/dL (ref 30.0–36.0)
MCHC: 34.7 g/dL (ref 30.0–36.0)
MCHC: 34.8 g/dL (ref 30.0–36.0)
MCV: 88.5 fL (ref 78.0–100.0)
MCV: 88.6 fL (ref 78.0–100.0)
MCV: 88.1 fL (ref 78.0–100.0)
MCV: 88.6 fL (ref 78.0–100.0)
MCV: 89.3 fL (ref 78.0–100.0)
MCV: 89.3 fL (ref 78.0–100.0)
Platelets: 115 10*3/uL — ABNORMAL LOW (ref 150–400)
Platelets: 123 10*3/uL — ABNORMAL LOW (ref 150–400)
Platelets: 251 10*3/uL (ref 150–400)
Platelets: 278 10*3/uL (ref 150–400)
Platelets: 152 10*3/uL (ref 150–400)
Platelets: 175 10*3/uL (ref 150–400)
Platelets: 182 10*3/uL (ref 150–400)
Platelets: 212 10*3/uL (ref 150–400)
RBC: 2.84 MIL/uL — ABNORMAL LOW (ref 4.22–5.81)
RBC: 2.91 MIL/uL — ABNORMAL LOW (ref 4.22–5.81)
RBC: 3.43 MIL/uL — ABNORMAL LOW (ref 4.22–5.81)
RBC: 2.55 MIL/uL — ABNORMAL LOW (ref 4.22–5.81)
RBC: 3.09 MIL/uL — ABNORMAL LOW (ref 4.22–5.81)
RBC: 3.13 MIL/uL — ABNORMAL LOW (ref 4.22–5.81)
RDW: 17.7 % — ABNORMAL HIGH (ref 11.5–15.5)
RDW: 18 % — ABNORMAL HIGH (ref 11.5–15.5)
RDW: 17 % — ABNORMAL HIGH (ref 11.5–15.5)
RDW: 17.4 % — ABNORMAL HIGH (ref 11.5–15.5)
RDW: 17.9 % — ABNORMAL HIGH (ref 11.5–15.5)
RDW: 18 % — ABNORMAL HIGH (ref 11.5–15.5)
RDW: 18.1 % — ABNORMAL HIGH (ref 11.5–15.5)
WBC: 3.4 10*3/uL — ABNORMAL LOW (ref 4.0–10.5)
WBC: 3.7 10*3/uL — ABNORMAL LOW (ref 4.0–10.5)
WBC: 4.8 10*3/uL (ref 4.0–10.5)
WBC: 4.9 10*3/uL (ref 4.0–10.5)
WBC: 6.9 10*3/uL (ref 4.0–10.5)
WBC: 6.9 10*3/uL (ref 4.0–10.5)
WBC: 9.9 10*3/uL (ref 4.0–10.5)

## 2011-03-02 LAB — LIPASE, BLOOD
Lipase: 31 U/L (ref 11–59)
Lipase: 32 U/L (ref 11–59)

## 2011-03-02 LAB — CARDIAC PANEL(CRET KIN+CKTOT+MB+TROPI)
CK, MB: 1.8 ng/mL (ref 0.3–4.0)
CK, MB: 2.1 ng/mL (ref 0.3–4.0)
Relative Index: 1.3 (ref 0.0–2.5)
Total CK: 137 U/L (ref 7–232)
Total CK: 174 U/L (ref 7–232)
Total CK: 223 U/L (ref 7–232)
Troponin I: 0.05 ng/mL (ref 0.00–0.06)

## 2011-03-02 LAB — BLOOD GAS, ARTERIAL
Delivery systems: POSITIVE
Expiratory PAP: 7
Inspiratory PAP: 14
Patient temperature: 37
pCO2 arterial: 32.6 mmHg — ABNORMAL LOW (ref 35.0–45.0)
pCO2 arterial: 37.3 mmHg (ref 35.0–45.0)
pH, Arterial: 7.418 (ref 7.350–7.450)
pH, Arterial: 7.309 — ABNORMAL LOW (ref 7.350–7.450)
pO2, Arterial: 82.4 mmHg (ref 80.0–100.0)

## 2011-03-02 LAB — MRSA PCR SCREENING
MRSA by PCR: NEGATIVE
MRSA by PCR: NEGATIVE

## 2011-03-02 LAB — URINALYSIS, ROUTINE W REFLEX MICROSCOPIC
Glucose, UA: 250 mg/dL — AB
Glucose, UA: 250 mg/dL — AB
Glucose, UA: 100 mg/dL — AB
Ketones, ur: NEGATIVE mg/dL
Ketones, ur: NEGATIVE mg/dL
Ketones, ur: NEGATIVE mg/dL
Leukocytes, UA: NEGATIVE
Leukocytes, UA: NEGATIVE
Protein, ur: 100 mg/dL — AB
Specific Gravity, Urine: 1.01 (ref 1.005–1.030)
Urobilinogen, UA: 0.2 mg/dL (ref 0.0–1.0)
pH: 7.5 (ref 5.0–8.0)
pH: 8 (ref 5.0–8.0)

## 2011-03-02 LAB — LACTIC ACID, PLASMA: Lactic Acid, Venous: 1 mmol/L (ref 0.5–2.2)

## 2011-03-02 LAB — POCT CARDIAC MARKERS
CKMB, poc: 1.1 ng/mL (ref 1.0–8.0)
CKMB, poc: 1.2 ng/mL (ref 1.0–8.0)
CKMB, poc: <1 ng/mL — ABNORMAL LOW (ref 1.0–8.0)
Myoglobin, poc: 412 ng/mL (ref 12–200)
Troponin i, poc: <0.05 ng/mL (ref 0.00–0.09)
Troponin i, poc: <0.05 ng/mL (ref 0.00–0.09)
Troponin i, poc: <0.05 ng/mL (ref 0.00–0.09)
Troponin i, poc: <0.05 ng/mL (ref 0.00–0.09)

## 2011-03-02 LAB — URINE MICROSCOPIC-ADD ON

## 2011-03-02 LAB — CORTISOL-AM, BLOOD: Cortisol - AM: 11.7 ug/dL (ref 4.3–22.4)

## 2011-03-02 LAB — BRAIN NATRIURETIC PEPTIDE: Pro B Natriuretic peptide (BNP): >3200 pg/mL — ABNORMAL HIGH (ref 0.0–100.0)

## 2011-03-02 LAB — CROSSMATCH: ABO/RH(D): B NEG

## 2011-03-02 LAB — ALBUMIN: Albumin: 4.1 g/dL (ref 3.5–5.2)

## 2011-03-02 LAB — ABO/RH: ABO/RH(D): B NEG

## 2011-03-02 LAB — TROPONIN I: Troponin I: 0.12 ng/mL — ABNORMAL HIGH (ref 0.00–0.06)

## 2011-03-02 LAB — LIPID PANEL
LDL Cholesterol: 63 mg/dL (ref 0–99)
Triglycerides: 77 mg/dL (ref <150)

## 2011-03-02 LAB — CK TOTAL AND CKMB (NOT AT ARMC): CK, MB: 1.3 ng/mL (ref 0.3–4.0)

## 2011-03-03 LAB — BASIC METABOLIC PANEL
BUN: 16 mg/dL (ref 6–23)
BUN: 35 mg/dL — ABNORMAL HIGH (ref 6–23)
BUN: 9 mg/dL (ref 6–23)
BUN: 14 mg/dL (ref 6–23)
BUN: 28 mg/dL — ABNORMAL HIGH (ref 6–23)
CO2: 22 mEq/L (ref 19–32)
CO2: 23 mEq/L (ref 19–32)
CO2: 24 mEq/L (ref 19–32)
CO2: 28 mEq/L (ref 19–32)
CO2: 22 mEq/L (ref 19–32)
CO2: 28 mEq/L (ref 19–32)
CO2: 24 mEq/L (ref 19–32)
Calcium: 9.5 mg/dL (ref 8.4–10.5)
Calcium: 9.9 mg/dL (ref 8.4–10.5)
Calcium: 9.9 mg/dL (ref 8.4–10.5)
Calcium: 9.3 mg/dL (ref 8.4–10.5)
Chloride: 90 mEq/L — ABNORMAL LOW (ref 96–112)
Chloride: 93 mEq/L — ABNORMAL LOW (ref 96–112)
Chloride: 96 mEq/L (ref 96–112)
Chloride: 97 mEq/L (ref 96–112)
Chloride: 98 mEq/L (ref 96–112)
Chloride: 92 mEq/L — ABNORMAL LOW (ref 96–112)
Chloride: 95 mEq/L — ABNORMAL LOW (ref 96–112)
Chloride: 90 mEq/L — ABNORMAL LOW (ref 96–112)
Creatinine, Ser: 3.91 mg/dL — ABNORMAL HIGH (ref 0.4–1.5)
Creatinine, Ser: 5.12 mg/dL — ABNORMAL HIGH (ref 0.4–1.5)
Creatinine, Ser: 6.72 mg/dL — ABNORMAL HIGH (ref 0.4–1.5)
Creatinine, Ser: 7.26 mg/dL — ABNORMAL HIGH (ref 0.4–1.5)
GFR calc Af Amer: 11 mL/min — ABNORMAL LOW (ref >60)
GFR calc Af Amer: 16 mL/min — ABNORMAL LOW (ref >60)
GFR calc Af Amer: 21 mL/min — ABNORMAL LOW (ref >60)
GFR calc Af Amer: 11 mL/min — ABNORMAL LOW (ref >60)
GFR calc non Af Amer: 18 mL/min — ABNORMAL LOW (ref >60)
Glucose, Bld: 105 mg/dL — ABNORMAL HIGH (ref 70–99)
Glucose, Bld: 83 mg/dL (ref 70–99)
Glucose, Bld: 96 mg/dL (ref 70–99)
Glucose, Bld: 98 mg/dL (ref 70–99)
Potassium: 4.4 mEq/L (ref 3.5–5.1)
Potassium: 5.3 mEq/L — ABNORMAL HIGH (ref 3.5–5.1)
Potassium: 3.5 mEq/L (ref 3.5–5.1)
Potassium: 4.3 mEq/L (ref 3.5–5.1)
Sodium: 135 mEq/L (ref 135–145)
Sodium: 127 mEq/L — ABNORMAL LOW (ref 135–145)
Sodium: 133 mEq/L — ABNORMAL LOW (ref 135–145)
Sodium: 128 mEq/L — ABNORMAL LOW (ref 135–145)

## 2011-03-03 LAB — HEPATITIS B SURFACE ANTIGEN: Hepatitis B Surface Ag: NEGATIVE

## 2011-03-03 LAB — CBC
HCT: 28 % — ABNORMAL LOW (ref 39.0–52.0)
HCT: 29.1 % — ABNORMAL LOW (ref 39.0–52.0)
HCT: 29.1 % — ABNORMAL LOW (ref 39.0–52.0)
HCT: 29.7 % — ABNORMAL LOW (ref 39.0–52.0)
HCT: 31.6 % — ABNORMAL LOW (ref 39.0–52.0)
HCT: 29 % — ABNORMAL LOW (ref 39.0–52.0)
HCT: 32 % — ABNORMAL LOW (ref 39.0–52.0)
Hemoglobin: 10.7 g/dL — ABNORMAL LOW (ref 13.0–17.0)
Hemoglobin: 9.7 g/dL — ABNORMAL LOW (ref 13.0–17.0)
Hemoglobin: 9.9 g/dL — ABNORMAL LOW (ref 13.0–17.0)
Hemoglobin: 10.5 g/dL — ABNORMAL LOW (ref 13.0–17.0)
Hemoglobin: 9.8 g/dL — ABNORMAL LOW (ref 13.0–17.0)
MCH: 30.2 pg (ref 26.0–34.0)
MCH: 30.3 pg (ref 26.0–34.0)
MCH: 30.3 pg (ref 26.0–34.0)
MCH: 30.4 pg (ref 26.0–34.0)
MCH: 30.4 pg (ref 26.0–34.0)
MCH: 30.4 pg (ref 26.0–34.0)
MCH: 30.4 pg (ref 26.0–34.0)
MCH: 30.9 pg (ref 26.0–34.0)
MCHC: 33.9 g/dL (ref 30.0–36.0)
MCHC: 34.2 g/dL (ref 30.0–36.0)
MCHC: 34.4 g/dL (ref 30.0–36.0)
MCHC: 33.8 g/dL (ref 30.0–36.0)
MCV: 88 fL (ref 78.0–100.0)
MCV: 88 fL (ref 78.0–100.0)
MCV: 88.6 fL (ref 78.0–100.0)
MCV: 89.1 fL (ref 78.0–100.0)
MCV: 89.3 fL (ref 78.0–100.0)
MCV: 89.8 fL (ref 78.0–100.0)
MCV: 89.8 fL (ref 78.0–100.0)
MCV: 88.8 fL (ref 78.0–100.0)
MCV: 90.1 fL (ref 78.0–100.0)
MCV: 90.7 fL (ref 78.0–100.0)
Platelets: 151 10*3/uL (ref 150–400)
Platelets: 162 10*3/uL (ref 150–400)
Platelets: 169 10*3/uL (ref 150–400)
Platelets: 172 10*3/uL (ref 150–400)
Platelets: 178 10*3/uL (ref 150–400)
Platelets: 211 10*3/uL (ref 150–400)
RBC: 2.98 MIL/uL — ABNORMAL LOW (ref 4.22–5.81)
RBC: 3.19 MIL/uL — ABNORMAL LOW (ref 4.22–5.81)
RBC: 3.27 MIL/uL — ABNORMAL LOW (ref 4.22–5.81)
RBC: 3.29 MIL/uL — ABNORMAL LOW (ref 4.22–5.81)
RBC: 3.74 MIL/uL — ABNORMAL LOW (ref 4.22–5.81)
RBC: 3.39 MIL/uL — ABNORMAL LOW (ref 4.22–5.81)
RDW: 17 % — ABNORMAL HIGH (ref 11.5–15.5)
RDW: 17.4 % — ABNORMAL HIGH (ref 11.5–15.5)
RDW: 17.6 % — ABNORMAL HIGH (ref 11.5–15.5)
RDW: 18.4 % — ABNORMAL HIGH (ref 11.5–15.5)
WBC: 4.2 10*3/uL (ref 4.0–10.5)
WBC: 4.5 10*3/uL (ref 4.0–10.5)
WBC: 5.1 10*3/uL (ref 4.0–10.5)
WBC: 3.5 10*3/uL — ABNORMAL LOW (ref 4.0–10.5)

## 2011-03-03 LAB — COMPREHENSIVE METABOLIC PANEL WITH GFR
ALT: 10 U/L (ref 0–53)
Alkaline Phosphatase: 113 U/L (ref 39–117)
CO2: 26 meq/L (ref 19–32)
GFR calc non Af Amer: 13 mL/min — ABNORMAL LOW (ref >60)
Glucose, Bld: 126 mg/dL — ABNORMAL HIGH (ref 70–99)
Potassium: 4.2 meq/L (ref 3.5–5.1)
Sodium: 130 meq/L — ABNORMAL LOW (ref 135–145)

## 2011-03-03 LAB — CARDIAC PANEL(CRET KIN+CKTOT+MB+TROPI)
CK, MB: 2 ng/mL (ref 0.3–4.0)
CK, MB: 1.3 ng/mL (ref 0.3–4.0)
CK, MB: 1.8 ng/mL (ref 0.3–4.0)
CK, MB: 3.7 ng/mL (ref 0.3–4.0)
Relative Index: 1.6 (ref 0.0–2.5)
Relative Index: INVALID (ref 0.0–2.5)
Relative Index: INVALID (ref 0.0–2.5)
Relative Index: 1.4 (ref 0.0–2.5)
Relative Index: 1.4 (ref 0.0–2.5)
Relative Index: 1.4 (ref 0.0–2.5)
Total CK: 134 U/L (ref 7–232)
Total CK: 83 U/L (ref 7–232)
Total CK: 104 U/L (ref 7–232)
Total CK: 257 U/L — ABNORMAL HIGH (ref 7–232)
Troponin I: 0.03 ng/mL (ref 0.00–0.06)
Troponin I: 0.06 ng/mL (ref 0.00–0.06)
Troponin I: 0.08 ng/mL — ABNORMAL HIGH (ref 0.00–0.06)

## 2011-03-03 LAB — DIFFERENTIAL
Basophils Absolute: 0 10*3/uL (ref 0.0–0.1)
Basophils Absolute: 0 10*3/uL (ref 0.0–0.1)
Basophils Absolute: 0 10*3/uL (ref 0.0–0.1)
Basophils Absolute: 0 10*3/uL (ref 0.0–0.1)
Basophils Relative: 0 % (ref 0–1)
Basophils Relative: 0 % (ref 0–1)
Basophils Relative: 1 % (ref 0–1)
Basophils Relative: 1 % (ref 0–1)
Basophils Relative: 0 % (ref 0–1)
Eosinophils Absolute: 0.1 10*3/uL (ref 0.0–0.7)
Eosinophils Absolute: 0.1 10*3/uL (ref 0.0–0.7)
Eosinophils Absolute: 0.1 10*3/uL (ref 0.0–0.7)
Eosinophils Absolute: 0.2 10*3/uL (ref 0.0–0.7)
Eosinophils Absolute: 0.2 10*3/uL (ref 0.0–0.7)
Eosinophils Absolute: 0.2 K/uL (ref 0.0–0.7)
Eosinophils Absolute: 0 10*3/uL (ref 0.0–0.7)
Eosinophils Absolute: 0.1 10*3/uL (ref 0.0–0.7)
Eosinophils Relative: 2 % (ref 0–5)
Eosinophils Relative: 3 % (ref 0–5)
Eosinophils Relative: 4 % (ref 0–5)
Eosinophils Relative: 4 % (ref 0–5)
Eosinophils Relative: 4 % (ref 0–5)
Eosinophils Relative: 0 % (ref 0–5)
Eosinophils Relative: 1 % (ref 0–5)
Eosinophils Relative: 1 % (ref 0–5)
Lymphocytes Relative: 19 % (ref 12–46)
Lymphocytes Relative: 28 % (ref 12–46)
Lymphocytes Relative: 30 % (ref 12–46)
Lymphocytes Relative: 8 % — ABNORMAL LOW (ref 12–46)
Lymphs Abs: 0.8 10*3/uL (ref 0.7–4.0)
Lymphs Abs: 1.1 10*3/uL (ref 0.7–4.0)
Lymphs Abs: 1.4 10*3/uL (ref 0.7–4.0)
Lymphs Abs: 1.6 10*3/uL (ref 0.7–4.0)
Lymphs Abs: 0.3 10*3/uL — ABNORMAL LOW (ref 0.7–4.0)
Lymphs Abs: 1.9 10*3/uL (ref 0.7–4.0)
Monocytes Absolute: 0.3 10*3/uL (ref 0.1–1.0)
Monocytes Absolute: 0.4 10*3/uL (ref 0.1–1.0)
Monocytes Absolute: 0.4 10*3/uL (ref 0.1–1.0)
Monocytes Absolute: 0.5 10*3/uL (ref 0.1–1.0)
Monocytes Absolute: 0.2 10*3/uL (ref 0.1–1.0)
Monocytes Absolute: 0.5 10*3/uL (ref 0.1–1.0)
Monocytes Relative: 10 % (ref 3–12)
Monocytes Relative: 12 % (ref 3–12)
Monocytes Relative: 6 % (ref 3–12)
Monocytes Relative: 8 % (ref 3–12)
Monocytes Relative: 7 % (ref 3–12)
Monocytes Relative: 7 % (ref 3–12)
Neutro Abs: 1.9 10*3/uL (ref 1.7–7.7)
Neutro Abs: 2.7 10*3/uL (ref 1.7–7.7)
Neutrophils Relative %: 53 % (ref 43–77)
Neutrophils Relative %: 53 % (ref 43–77)
Neutrophils Relative %: 64 % (ref 43–77)
Neutrophils Relative %: 66 % (ref 43–77)
Neutrophils Relative %: 55 % (ref 43–77)

## 2011-03-03 LAB — CK TOTAL AND CKMB (NOT AT ARMC)
CK, MB: 1.9 ng/mL (ref 0.3–4.0)
CK, MB: 2 ng/mL (ref 0.3–4.0)
Relative Index: 1.3 (ref 0.0–2.5)
Relative Index: 1.4 (ref 0.0–2.5)
Total CK: 143 U/L (ref 7–232)
Total CK: 146 U/L (ref 7–232)

## 2011-03-03 LAB — COMPREHENSIVE METABOLIC PANEL
AST: 16 U/L (ref 0–37)
AST: 17 U/L (ref 0–37)
Albumin: 3.7 g/dL (ref 3.5–5.2)
Albumin: 3.8 g/dL (ref 3.5–5.2)
Alkaline Phosphatase: 106 U/L (ref 39–117)
BUN: 15 mg/dL (ref 6–23)
BUN: 21 mg/dL (ref 6–23)
BUN: 30 mg/dL — ABNORMAL HIGH (ref 6–23)
CO2: 25 mEq/L (ref 19–32)
CO2: 26 mEq/L (ref 19–32)
Calcium: 9.6 mg/dL (ref 8.4–10.5)
Chloride: 90 mEq/L — ABNORMAL LOW (ref 96–112)
Chloride: 92 mEq/L — ABNORMAL LOW (ref 96–112)
Chloride: 97 mEq/L (ref 96–112)
Creatinine, Ser: 4.64 mg/dL — ABNORMAL HIGH (ref 0.4–1.5)
Creatinine, Ser: 4.98 mg/dL — ABNORMAL HIGH (ref 0.4–1.5)
Creatinine, Ser: 5.62 mg/dL — ABNORMAL HIGH (ref 0.4–1.5)
GFR calc Af Amer: 16 mL/min — ABNORMAL LOW (ref >60)
GFR calc non Af Amer: 12 mL/min — ABNORMAL LOW (ref >60)
GFR calc non Af Amer: 14 mL/min — ABNORMAL LOW (ref >60)
Glucose, Bld: 85 mg/dL (ref 70–99)
Potassium: 3.7 mEq/L (ref 3.5–5.1)
Total Bilirubin: 0.4 mg/dL (ref 0.3–1.2)
Total Bilirubin: 0.5 mg/dL (ref 0.3–1.2)
Total Bilirubin: 0.6 mg/dL (ref 0.3–1.2)
Total Protein: 6.6 g/dL (ref 6.0–8.3)

## 2011-03-03 LAB — URINALYSIS, ROUTINE W REFLEX MICROSCOPIC
Glucose, UA: 100 mg/dL — AB
Leukocytes, UA: NEGATIVE
Protein, ur: 100 mg/dL — AB
pH: 7.5 (ref 5.0–8.0)

## 2011-03-03 LAB — CULTURE, BLOOD (ROUTINE X 2)
Culture: NO GROWTH
Report Status: 9262011

## 2011-03-03 LAB — MAGNESIUM: Magnesium: 1.8 mg/dL (ref 1.5–2.5)

## 2011-03-03 LAB — POCT CARDIAC MARKERS
CKMB, poc: 1 ng/mL (ref 1.0–8.0)
CKMB, poc: <1 ng/mL — ABNORMAL LOW (ref 1.0–8.0)
CKMB, poc: 1.4 ng/mL (ref 1.0–8.0)
Myoglobin, poc: >500 ng/mL (ref 12–200)
Troponin i, poc: <0.05 ng/mL (ref 0.00–0.09)
Troponin i, poc: <0.05 ng/mL (ref 0.00–0.09)

## 2011-03-03 LAB — HEPATIC FUNCTION PANEL
Alkaline Phosphatase: 108 U/L (ref 39–117)
Total Bilirubin: 0.5 mg/dL (ref 0.3–1.2)
Total Protein: 6.4 g/dL (ref 6.0–8.3)

## 2011-03-03 LAB — LIPASE, BLOOD
Lipase: 32 U/L (ref 11–59)
Lipase: 47 U/L (ref 11–59)

## 2011-03-03 LAB — TROPONIN I
Troponin I: 0.03 ng/mL (ref 0.00–0.06)
Troponin I: 0.05 ng/mL (ref 0.00–0.06)

## 2011-03-03 LAB — URINE CULTURE: Culture: NO GROWTH

## 2011-03-03 LAB — URINE MICROSCOPIC-ADD ON

## 2011-03-03 LAB — PHOSPHORUS: Phosphorus: 7.4 mg/dL — ABNORMAL HIGH (ref 2.3–4.6)

## 2011-03-04 LAB — BASIC METABOLIC PANEL
BUN: 50 mg/dL — ABNORMAL HIGH (ref 6–23)
CO2: 21 mEq/L (ref 19–32)
CO2: 26 mEq/L (ref 19–32)
Calcium: 9.7 mg/dL (ref 8.4–10.5)
Chloride: 94 mEq/L — ABNORMAL LOW (ref 96–112)
Chloride: 97 mEq/L (ref 96–112)
Creatinine, Ser: 6.54 mg/dL — ABNORMAL HIGH (ref 0.4–1.5)
GFR calc Af Amer: 12 mL/min — ABNORMAL LOW (ref >60)
GFR calc non Af Amer: 6 mL/min — ABNORMAL LOW (ref >60)
Glucose, Bld: 84 mg/dL (ref 70–99)
Potassium: 4.7 mEq/L (ref 3.5–5.1)
Sodium: 136 mEq/L (ref 135–145)

## 2011-03-04 LAB — BRAIN NATRIURETIC PEPTIDE: Pro B Natriuretic peptide (BNP): 2010 pg/mL — ABNORMAL HIGH (ref 0.0–100.0)

## 2011-03-04 LAB — DIFFERENTIAL
Basophils Absolute: 0 10*3/uL (ref 0.0–0.1)
Basophils Relative: 0 % (ref 0–1)
Eosinophils Absolute: 0.1 10*3/uL (ref 0.0–0.7)
Eosinophils Absolute: 0.1 10*3/uL (ref 0.0–0.7)
Eosinophils Relative: 2 % (ref 0–5)
Lymphocytes Relative: 21 % (ref 12–46)
Lymphs Abs: 1 10*3/uL (ref 0.7–4.0)
Monocytes Absolute: 0.3 10*3/uL (ref 0.1–1.0)
Monocytes Relative: 7 % (ref 3–12)
Monocytes Relative: 7 % (ref 3–12)
Neutrophils Relative %: 70 % (ref 43–77)

## 2011-03-04 LAB — CBC
HCT: 26.9 % — ABNORMAL LOW (ref 39.0–52.0)
Hemoglobin: 10.2 g/dL — ABNORMAL LOW (ref 13.0–17.0)
MCH: 29.8 pg (ref 26.0–34.0)
MCH: 29.8 pg (ref 26.0–34.0)
MCHC: 34 g/dL (ref 30.0–36.0)
MCV: 86.5 fL (ref 78.0–100.0)
MCV: 87.5 fL (ref 78.0–100.0)
Platelets: 157 10*3/uL (ref 150–400)
RBC: 3.44 MIL/uL — ABNORMAL LOW (ref 4.22–5.81)
RDW: 16.7 % — ABNORMAL HIGH (ref 11.5–15.5)
WBC: 4.7 10*3/uL (ref 4.0–10.5)

## 2011-03-04 LAB — CARDIAC PANEL(CRET KIN+CKTOT+MB+TROPI)
CK, MB: 2 ng/mL (ref 0.3–4.0)
Relative Index: 1.8 (ref 0.0–2.5)
Relative Index: 1.9 (ref 0.0–2.5)
Total CK: 109 U/L (ref 7–232)
Total CK: 114 U/L (ref 7–232)
Troponin I: 0.17 ng/mL — ABNORMAL HIGH (ref 0.00–0.06)
Troponin I: 0.17 ng/mL — ABNORMAL HIGH (ref 0.00–0.06)

## 2011-03-04 LAB — CULTURE, BLOOD (ROUTINE X 2): Report Status: 8082011

## 2011-03-04 LAB — LACTIC ACID, PLASMA: Lactic Acid, Venous: 0.4 mmol/L — ABNORMAL LOW (ref 0.5–2.2)

## 2011-03-05 LAB — DIFFERENTIAL
Basophils Relative: 0 % (ref 0–1)
Lymphocytes Relative: 27 % (ref 12–46)
Lymphs Abs: 1.1 10*3/uL (ref 0.7–4.0)
Monocytes Absolute: 0.4 10*3/uL (ref 0.1–1.0)
Monocytes Relative: 9 % (ref 3–12)
Neutro Abs: 2.4 10*3/uL (ref 1.7–7.7)
Neutrophils Relative %: 61 % (ref 43–77)

## 2011-03-05 LAB — COMPREHENSIVE METABOLIC PANEL
ALT: 9 U/L (ref 0–53)
Alkaline Phosphatase: 152 U/L — ABNORMAL HIGH (ref 39–117)
Chloride: 97 mEq/L (ref 96–112)
Glucose, Bld: 103 mg/dL — ABNORMAL HIGH (ref 70–99)
Potassium: 3.7 mEq/L (ref 3.5–5.1)
Sodium: 135 mEq/L (ref 135–145)
Total Bilirubin: 0.5 mg/dL (ref 0.3–1.2)
Total Protein: 6.9 g/dL (ref 6.0–8.3)

## 2011-03-05 LAB — POCT I-STAT 4, (NA,K, GLUC, HGB,HCT)
HCT: 39 % (ref 39.0–52.0)
Hemoglobin: 13.3 g/dL (ref 13.0–17.0)

## 2011-03-05 LAB — POCT CARDIAC MARKERS
CKMB, poc: <1 ng/mL — ABNORMAL LOW (ref 1.0–8.0)
Myoglobin, poc: 322 ng/mL (ref 12–200)
Troponin i, poc: <0.05 ng/mL (ref 0.00–0.09)

## 2011-03-05 LAB — CBC
HCT: 28.6 % — ABNORMAL LOW (ref 39.0–52.0)
Hemoglobin: 9.7 g/dL — ABNORMAL LOW (ref 13.0–17.0)
MCHC: 33.9 g/dL (ref 30.0–36.0)
RBC: 3.26 MIL/uL — ABNORMAL LOW (ref 4.22–5.81)
WBC: 3.9 10*3/uL — ABNORMAL LOW (ref 4.0–10.5)

## 2011-03-06 LAB — DIFFERENTIAL
Basophils Absolute: 0 10*3/uL (ref 0.0–0.1)
Basophils Absolute: 0 10*3/uL (ref 0.0–0.1)
Basophils Absolute: 0 10*3/uL (ref 0.0–0.1)
Basophils Absolute: 0 10*3/uL (ref 0.0–0.1)
Basophils Relative: 0 % (ref 0–1)
Basophils Relative: 1 % (ref 0–1)
Basophils Relative: 0 % (ref 0–1)
Basophils Relative: 0 % (ref 0–1)
Eosinophils Absolute: 0.1 10*3/uL (ref 0.0–0.7)
Eosinophils Absolute: 0.1 10*3/uL (ref 0.0–0.7)
Eosinophils Relative: 1 % (ref 0–5)
Eosinophils Relative: 1 % (ref 0–5)
Eosinophils Relative: 3 % (ref 0–5)
Eosinophils Relative: 4 % (ref 0–5)
Lymphocytes Relative: 21 % (ref 12–46)
Lymphocytes Relative: 32 % (ref 12–46)
Lymphocytes Relative: 30 % (ref 12–46)
Lymphocytes Relative: 39 % (ref 12–46)
Lymphocytes Relative: 42 % (ref 12–46)
Lymphs Abs: 0.8 10*3/uL (ref 0.7–4.0)
Lymphs Abs: 1.9 10*3/uL (ref 0.7–4.0)
Lymphs Abs: 1.9 10*3/uL (ref 0.7–4.0)
Lymphs Abs: 1.4 10*3/uL (ref 0.7–4.0)
Lymphs Abs: 1.5 10*3/uL (ref 0.7–4.0)
Monocytes Absolute: 0.2 10*3/uL (ref 0.1–1.0)
Monocytes Absolute: 0.5 10*3/uL (ref 0.1–1.0)
Monocytes Absolute: 0.2 10*3/uL (ref 0.1–1.0)
Monocytes Absolute: 0.2 10*3/uL (ref 0.1–1.0)
Monocytes Absolute: 0.3 10*3/uL (ref 0.1–1.0)
Monocytes Relative: 7 % (ref 3–12)
Monocytes Relative: 6 % (ref 3–12)
Monocytes Relative: 7 % (ref 3–12)
Monocytes Relative: 6 % (ref 3–12)
Monocytes Relative: 6 % (ref 3–12)
Neutro Abs: 2.3 10*3/uL (ref 1.7–7.7)
Neutro Abs: 4.6 10*3/uL (ref 1.7–7.7)
Neutro Abs: 2.4 10*3/uL (ref 1.7–7.7)
Neutro Abs: 1.7 10*3/uL (ref 1.7–7.7)
Neutrophils Relative %: 70 % (ref 43–77)
Neutrophils Relative %: 65 % (ref 43–77)
Neutrophils Relative %: 61 % (ref 43–77)
Neutrophils Relative %: 49 % (ref 43–77)

## 2011-03-06 LAB — FOLATE RBC: RBC Folate: 472 ng/mL (ref 180–600)

## 2011-03-06 LAB — COMPREHENSIVE METABOLIC PANEL
ALT: 10 U/L (ref 0–53)
ALT: 10 U/L (ref 0–53)
ALT: 9 U/L (ref 0–53)
AST: 11 U/L (ref 0–37)
AST: 11 U/L (ref 0–37)
AST: 13 U/L (ref 0–37)
Albumin: 3.1 g/dL — ABNORMAL LOW (ref 3.5–5.2)
Albumin: 3.1 g/dL — ABNORMAL LOW (ref 3.5–5.2)
Albumin: 3.8 g/dL (ref 3.5–5.2)
Alkaline Phosphatase: 122 U/L — ABNORMAL HIGH (ref 39–117)
Calcium: 8.7 mg/dL (ref 8.4–10.5)
Calcium: 9.1 mg/dL (ref 8.4–10.5)
Calcium: 9.2 mg/dL (ref 8.4–10.5)
GFR calc Af Amer: 13 mL/min — ABNORMAL LOW (ref >60)
GFR calc Af Amer: 17 mL/min — ABNORMAL LOW (ref >60)
GFR calc Af Amer: 9 mL/min — ABNORMAL LOW (ref >60)
Glucose, Bld: 147 mg/dL — ABNORMAL HIGH (ref 70–99)
Potassium: 3.2 mEq/L — ABNORMAL LOW (ref 3.5–5.1)
Potassium: 3.9 mEq/L (ref 3.5–5.1)
Sodium: 124 mEq/L — ABNORMAL LOW (ref 135–145)
Sodium: 132 mEq/L — ABNORMAL LOW (ref 135–145)
Sodium: 136 mEq/L (ref 135–145)
Total Protein: 5.2 g/dL — ABNORMAL LOW (ref 6.0–8.3)
Total Protein: 5.6 g/dL — ABNORMAL LOW (ref 6.0–8.3)
Total Protein: 6.7 g/dL (ref 6.0–8.3)

## 2011-03-06 LAB — RENAL FUNCTION PANEL
Albumin: 3 g/dL — ABNORMAL LOW (ref 3.5–5.2)
Albumin: 3.3 g/dL — ABNORMAL LOW (ref 3.5–5.2)
Albumin: 3.1 g/dL — ABNORMAL LOW (ref 3.5–5.2)
BUN: 29 mg/dL — ABNORMAL HIGH (ref 6–23)
BUN: 20 mg/dL (ref 6–23)
CO2: 27 mEq/L (ref 19–32)
CO2: 27 mEq/L (ref 19–32)
Calcium: 8.6 mg/dL (ref 8.4–10.5)
Calcium: 8.9 mg/dL (ref 8.4–10.5)
Chloride: 90 mEq/L — ABNORMAL LOW (ref 96–112)
Creatinine, Ser: 2.88 mg/dL — ABNORMAL HIGH (ref 0.4–1.5)
Creatinine, Ser: 5.07 mg/dL — ABNORMAL HIGH (ref 0.4–1.5)
Creatinine, Ser: 6.21 mg/dL — ABNORMAL HIGH (ref 0.4–1.5)
Creatinine, Ser: 4.67 mg/dL — ABNORMAL HIGH (ref 0.4–1.5)
GFR calc Af Amer: 16 mL/min — ABNORMAL LOW (ref >60)
GFR calc Af Amer: 30 mL/min — ABNORMAL LOW (ref >60)
GFR calc Af Amer: 17 mL/min — ABNORMAL LOW (ref >60)
GFR calc non Af Amer: 25 mL/min — ABNORMAL LOW (ref >60)
GFR calc non Af Amer: 14 mL/min — ABNORMAL LOW (ref >60)
Glucose, Bld: 184 mg/dL — ABNORMAL HIGH (ref 70–99)
Glucose, Bld: 90 mg/dL (ref 70–99)
Phosphorus: 4.9 mg/dL — ABNORMAL HIGH (ref 2.3–4.6)
Phosphorus: 6.2 mg/dL — ABNORMAL HIGH (ref 2.3–4.6)
Potassium: 3.9 mEq/L (ref 3.5–5.1)

## 2011-03-06 LAB — CBC
HCT: 26.1 % — ABNORMAL LOW (ref 39.0–52.0)
HCT: 27.7 % — ABNORMAL LOW (ref 39.0–52.0)
HCT: 25.7 % — ABNORMAL LOW (ref 39.0–52.0)
HCT: 27.3 % — ABNORMAL LOW (ref 39.0–52.0)
HCT: 30.2 % — ABNORMAL LOW (ref 39.0–52.0)
HCT: 30.7 % — ABNORMAL LOW (ref 39.0–52.0)
HCT: 33.2 % — ABNORMAL LOW (ref 39.0–52.0)
Hemoglobin: 9 g/dL — ABNORMAL LOW (ref 13.0–17.0)
Hemoglobin: 9.8 g/dL — ABNORMAL LOW (ref 13.0–17.0)
Hemoglobin: 10.6 g/dL — ABNORMAL LOW (ref 13.0–17.0)
Hemoglobin: 9.5 g/dL — ABNORMAL LOW (ref 13.0–17.0)
Hemoglobin: 10.1 g/dL — ABNORMAL LOW (ref 13.0–17.0)
Hemoglobin: 10.6 g/dL — ABNORMAL LOW (ref 13.0–17.0)
Hemoglobin: 11.1 g/dL — ABNORMAL LOW (ref 13.0–17.0)
MCH: 30.8 pg (ref 26.0–34.0)
MCH: 30.5 pg (ref 26.0–34.0)
MCH: 30.8 pg (ref 26.0–34.0)
MCH: 31 pg (ref 26.0–34.0)
MCH: 31.4 pg (ref 26.0–34.0)
MCHC: 33.5 g/dL (ref 30.0–36.0)
MCHC: 33.7 g/dL (ref 30.0–36.0)
MCHC: 34.1 g/dL (ref 30.0–36.0)
MCHC: 34.5 g/dL (ref 30.0–36.0)
MCHC: 34.5 g/dL (ref 30.0–36.0)
MCHC: 33.7 g/dL (ref 30.0–36.0)
MCHC: 33.9 g/dL (ref 30.0–36.0)
MCV: 90 fL (ref 78.0–100.0)
MCV: 89.8 fL (ref 78.0–100.0)
MCV: 90.8 fL (ref 78.0–100.0)
MCV: 91.4 fL (ref 78.0–100.0)
MCV: 93.3 fL (ref 78.0–100.0)
MCV: 90.7 fL (ref 78.0–100.0)
MCV: 91.4 fL (ref 78.0–100.0)
Platelets: 132 10*3/uL — ABNORMAL LOW (ref 150–400)
Platelets: 119 10*3/uL — ABNORMAL LOW (ref 150–400)
Platelets: 149 10*3/uL — ABNORMAL LOW (ref 150–400)
Platelets: 151 10*3/uL (ref 150–400)
Platelets: 164 10*3/uL (ref 150–400)
Platelets: 222 10*3/uL (ref 150–400)
Platelets: 332 10*3/uL (ref 150–400)
RBC: 2.91 MIL/uL — ABNORMAL LOW (ref 4.22–5.81)
RBC: 3.32 MIL/uL — ABNORMAL LOW (ref 4.22–5.81)
RBC: 3.45 MIL/uL — ABNORMAL LOW (ref 4.22–5.81)
RBC: 3.39 MIL/uL — ABNORMAL LOW (ref 4.22–5.81)
RBC: 3.64 MIL/uL — ABNORMAL LOW (ref 4.22–5.81)
RDW: 16.5 % — ABNORMAL HIGH (ref 11.5–15.5)
RDW: 16.4 % — ABNORMAL HIGH (ref 11.5–15.5)
RDW: 16.5 % — ABNORMAL HIGH (ref 11.5–15.5)
RDW: 16.7 % — ABNORMAL HIGH (ref 11.5–15.5)
RDW: 17.5 % — ABNORMAL HIGH (ref 11.5–15.5)
RDW: 17.6 % — ABNORMAL HIGH (ref 11.5–15.5)
RDW: 16.9 % — ABNORMAL HIGH (ref 11.5–15.5)
WBC: 3.3 10*3/uL — ABNORMAL LOW (ref 4.0–10.5)
WBC: 4.7 10*3/uL (ref 4.0–10.5)
WBC: 4.6 10*3/uL (ref 4.0–10.5)
WBC: 6 10*3/uL (ref 4.0–10.5)
WBC: 6.1 10*3/uL (ref 4.0–10.5)
WBC: 6.8 10*3/uL (ref 4.0–10.5)
WBC: 3.6 10*3/uL — ABNORMAL LOW (ref 4.0–10.5)
WBC: 3.9 10*3/uL — ABNORMAL LOW (ref 4.0–10.5)
WBC: 4.5 10*3/uL (ref 4.0–10.5)

## 2011-03-06 LAB — CK TOTAL AND CKMB (NOT AT ARMC)
CK, MB: 0.9 ng/mL (ref 0.3–4.0)
CK, MB: 1.6 ng/mL (ref 0.3–4.0)
CK, MB: 1.3 ng/mL (ref 0.3–4.0)
CK, MB: 1.4 ng/mL (ref 0.3–4.0)
Relative Index: INVALID (ref 0.0–2.5)
Relative Index: INVALID (ref 0.0–2.5)
Relative Index: INVALID (ref 0.0–2.5)
Relative Index: INVALID (ref 0.0–2.5)
Total CK: 26 U/L (ref 7–232)
Total CK: 73 U/L (ref 7–232)

## 2011-03-06 LAB — RAPID URINE DRUG SCREEN, HOSP PERFORMED
Amphetamines: NOT DETECTED
Benzodiazepines: NOT DETECTED

## 2011-03-06 LAB — POCT I-STAT 3, ART BLOOD GAS (G3+)
Bicarbonate: 24.8 mEq/L — ABNORMAL HIGH (ref 20.0–24.0)
O2 Saturation: 94 %
pO2, Arterial: 71 mmHg — ABNORMAL LOW (ref 80.0–100.0)

## 2011-03-06 LAB — BASIC METABOLIC PANEL
BUN: 30 mg/dL — ABNORMAL HIGH (ref 6–23)
BUN: 48 mg/dL — ABNORMAL HIGH (ref 6–23)
BUN: 52 mg/dL — ABNORMAL HIGH (ref 6–23)
CO2: 24 mEq/L (ref 19–32)
CO2: 29 mEq/L (ref 19–32)
Calcium: 8.7 mg/dL (ref 8.4–10.5)
Calcium: 9 mg/dL (ref 8.4–10.5)
Calcium: 9.5 mg/dL (ref 8.4–10.5)
Calcium: 8.9 mg/dL (ref 8.4–10.5)
Calcium: 9.1 mg/dL (ref 8.4–10.5)
Chloride: 89 mEq/L — ABNORMAL LOW (ref 96–112)
Chloride: 98 mEq/L (ref 96–112)
Chloride: 88 mEq/L — ABNORMAL LOW (ref 96–112)
Chloride: 94 mEq/L — ABNORMAL LOW (ref 96–112)
Creatinine, Ser: 3.94 mg/dL — ABNORMAL HIGH (ref 0.4–1.5)
Creatinine, Ser: 6.88 mg/dL — ABNORMAL HIGH (ref 0.4–1.5)
GFR calc Af Amer: 11 mL/min — ABNORMAL LOW (ref >60)
GFR calc Af Amer: 21 mL/min — ABNORMAL LOW (ref >60)
GFR calc Af Amer: 37 mL/min — ABNORMAL LOW (ref >60)
GFR calc Af Amer: 13 mL/min — ABNORMAL LOW (ref >60)
GFR calc non Af Amer: 12 mL/min — ABNORMAL LOW (ref >60)
GFR calc non Af Amer: 31 mL/min — ABNORMAL LOW (ref >60)
GFR calc non Af Amer: 11 mL/min — ABNORMAL LOW (ref >60)
GFR calc non Af Amer: 8 mL/min — ABNORMAL LOW (ref >60)
Glucose, Bld: 89 mg/dL (ref 70–99)
Glucose, Bld: 80 mg/dL (ref 70–99)
Glucose, Bld: 99 mg/dL (ref 70–99)
Glucose, Bld: 99 mg/dL (ref 70–99)
Potassium: 3.5 mEq/L (ref 3.5–5.1)
Potassium: 4 mEq/L (ref 3.5–5.1)
Potassium: 4.3 mEq/L (ref 3.5–5.1)
Potassium: 4.6 mEq/L (ref 3.5–5.1)
Potassium: 5.2 mEq/L — ABNORMAL HIGH (ref 3.5–5.1)
Sodium: 125 mEq/L — ABNORMAL LOW (ref 135–145)
Sodium: 133 mEq/L — ABNORMAL LOW (ref 135–145)
Sodium: 135 mEq/L (ref 135–145)
Sodium: 127 mEq/L — ABNORMAL LOW (ref 135–145)
Sodium: 130 mEq/L — ABNORMAL LOW (ref 135–145)

## 2011-03-06 LAB — TROPONIN I
Troponin I: 0.03 ng/mL (ref 0.00–0.06)
Troponin I: 0.04 ng/mL (ref 0.00–0.06)
Troponin I: 0.04 ng/mL (ref 0.00–0.06)
Troponin I: 0.05 ng/mL (ref 0.00–0.06)
Troponin I: 0.05 ng/mL (ref 0.00–0.06)

## 2011-03-06 LAB — LIPID PANEL
Cholesterol: 161 mg/dL (ref 0–200)
HDL: 59 mg/dL (ref >39)
LDL Cholesterol: 84 mg/dL (ref 0–99)
Total CHOL/HDL Ratio: 2.7 RATIO
Triglycerides: 59 mg/dL (ref <150)
Triglycerides: 88 mg/dL (ref <150)
VLDL: 12 mg/dL (ref 0–40)

## 2011-03-06 LAB — VITAMIN B12: Vitamin B-12: 385 pg/mL (ref 211–911)

## 2011-03-06 LAB — CARDIAC PANEL(CRET KIN+CKTOT+MB+TROPI)
CK, MB: 1.5 ng/mL (ref 0.3–4.0)
CK, MB: 1.7 ng/mL (ref 0.3–4.0)
CK, MB: 1 ng/mL (ref 0.3–4.0)
CK, MB: 1.4 ng/mL (ref 0.3–4.0)
CK, MB: 1.5 ng/mL (ref 0.3–4.0)
Relative Index: INVALID (ref 0.0–2.5)
Relative Index: INVALID (ref 0.0–2.5)
Total CK: 64 U/L (ref 7–232)
Total CK: 66 U/L (ref 7–232)
Total CK: 24 U/L (ref 7–232)
Total CK: 45 U/L (ref 7–232)
Total CK: 32 U/L (ref 7–232)
Total CK: 34 U/L (ref 7–232)
Troponin I: 0.04 ng/mL (ref 0.00–0.06)
Troponin I: 0.03 ng/mL (ref 0.00–0.06)
Troponin I: 0.04 ng/mL (ref 0.00–0.06)
Troponin I: 0.04 ng/mL (ref 0.00–0.06)
Troponin I: 0.07 ng/mL — ABNORMAL HIGH (ref 0.00–0.06)

## 2011-03-06 LAB — TSH: TSH: 5.165 u[IU]/mL — ABNORMAL HIGH (ref 0.350–4.500)

## 2011-03-06 LAB — POCT I-STAT, CHEM 8
BUN: 15 mg/dL (ref 6–23)
BUN: 41 mg/dL — ABNORMAL HIGH (ref 6–23)
Calcium, Ion: 1.06 mmol/L — ABNORMAL LOW (ref 1.12–1.32)
Chloride: 92 mEq/L — ABNORMAL LOW (ref 96–112)
Creatinine, Ser: 3.9 mg/dL — ABNORMAL HIGH (ref 0.4–1.5)
Glucose, Bld: 83 mg/dL (ref 70–99)
Glucose, Bld: 91 mg/dL (ref 70–99)
Potassium: 3.6 mEq/L (ref 3.5–5.1)
Sodium: 136 mEq/L (ref 135–145)
TCO2: 23 mmol/L (ref 0–100)

## 2011-03-06 LAB — HEPATITIS B SURFACE ANTIGEN: Hepatitis B Surface Ag: NEGATIVE

## 2011-03-06 LAB — APTT: aPTT: 33 seconds (ref 24–37)

## 2011-03-06 LAB — POCT CARDIAC MARKERS
CKMB, poc: <1 ng/mL — ABNORMAL LOW (ref 1.0–8.0)
CKMB, poc: <1 ng/mL — ABNORMAL LOW (ref 1.0–8.0)
CKMB, poc: <1 ng/mL — ABNORMAL LOW (ref 1.0–8.0)
CKMB, poc: <1 ng/mL — ABNORMAL LOW (ref 1.0–8.0)
Myoglobin, poc: 184 ng/mL (ref 12–200)
Myoglobin, poc: 207 ng/mL (ref 12–200)
Myoglobin, poc: 215 ng/mL (ref 12–200)
Myoglobin, poc: 266 ng/mL (ref 12–200)
Myoglobin, poc: 158 ng/mL (ref 12–200)
Troponin i, poc: <0.05 ng/mL (ref 0.00–0.09)
Troponin i, poc: <0.05 ng/mL (ref 0.00–0.09)

## 2011-03-06 LAB — ALT: ALT: 11 U/L (ref 0–53)

## 2011-03-06 LAB — HEPARIN INDUCED THROMBOCYTOPENIA PNL
Heparin Induced Plt Ab: NEGATIVE
Patient O.D.: 0.117
UFH Low Dose 0.1 IU/mL: 3 % Release
UFH SRA Result: NEGATIVE

## 2011-03-06 LAB — PHOSPHORUS: Phosphorus: 3.5 mg/dL (ref 2.3–4.6)

## 2011-03-06 LAB — PROTIME-INR
INR: 1.04 (ref 0.00–1.49)
Prothrombin Time: 12.8 seconds (ref 11.6–15.2)
Prothrombin Time: 13.5 seconds (ref 11.6–15.2)

## 2011-03-06 LAB — MRSA PCR SCREENING
MRSA by PCR: NEGATIVE
MRSA by PCR: NEGATIVE

## 2011-03-06 LAB — HEPATITIS B SURFACE ANTIBODY,QUALITATIVE: Hep B S Ab: NEGATIVE

## 2011-03-06 LAB — BRAIN NATRIURETIC PEPTIDE: Pro B Natriuretic peptide (BNP): >3200 pg/mL — ABNORMAL HIGH (ref 0.0–100.0)

## 2011-03-06 LAB — HEMOGLOBIN A1C: Hgb A1c MFr Bld: 4.9 % (ref <5.7)

## 2011-03-07 LAB — DIFFERENTIAL
Eosinophils Absolute: 0.1 10*3/uL (ref 0.0–0.7)
Eosinophils Relative: 1 % (ref 0–5)
Lymphocytes Relative: 19 % (ref 12–46)
Lymphs Abs: 1.1 10*3/uL (ref 0.7–4.0)
Monocytes Absolute: 0.3 10*3/uL (ref 0.1–1.0)

## 2011-03-07 LAB — BASIC METABOLIC PANEL
BUN: 16 mg/dL (ref 6–23)
Chloride: 98 mEq/L (ref 96–112)
GFR calc non Af Amer: 13 mL/min — ABNORMAL LOW (ref >60)
Glucose, Bld: 91 mg/dL (ref 70–99)
Potassium: 3.7 mEq/L (ref 3.5–5.1)
Sodium: 135 mEq/L (ref 135–145)

## 2011-03-07 LAB — CBC
HCT: 28.2 % — ABNORMAL LOW (ref 39.0–52.0)
Hemoglobin: 9.6 g/dL — ABNORMAL LOW (ref 13.0–17.0)
MCV: 91.2 fL (ref 78.0–100.0)
Platelets: 287 10*3/uL (ref 150–400)
WBC: 6 10*3/uL (ref 4.0–10.5)

## 2011-03-07 LAB — MAGNESIUM: Magnesium: 1.8 mg/dL (ref 1.5–2.5)

## 2011-03-07 NOTE — Discharge Summary (Signed)
  NAME:  [name], [name] NO.:  [id number]  MEDICAL RECORD NO.:  [id number]           PATIENT TYPE:  LOCATION:                                 FACILITY:  PHYSICIAN:  Melvyn Novas, MDDATE OF BIRTH:  12-Oct-1976  DATE OF ADMISSION: DATE OF DISCHARGE:  LH                              DISCHARGE SUMMARY   The patient is a 35 year old white male with chronic recurrent missions due to chronic noncompliance, cutting dialysis short with essentially volume overload, accelerated hypertension in the face of chronic renal failure with end-stage renal disease and dialysis, coronary artery disease, two-vessel bypass disease status post stenting with COPD.  He needs smoking cessation, chronic noncompliance, hyperlipidemia as well as bipolar disorder, depression, restless leg syndrome and anemia due to chronic renal disease.  The patient was again admitted with systolics in the 213 range, bilateral fluffy infiltrates consistent with CHF on chest x-ray and given diuresis.  Urgent dialysis 3 times decreasing his total body weight significantly.  He was continued and resumed on all of his antihypertensive medicines which he chronically forgets to take.  On the day of discharge, his blood pressure was 122/68 systolic.  He was afebrile.  He was treated empirically with vanc and Zosyn for hospital- acquired pneumonia due to recent admission.  His fluffy infiltrates were clear.  There was new sort of left infiltrate in the left lung base, not sure if this was atelectasis or possible early pneumonic infiltrate. Anyway clinically he does not have pneumonia, he has no significant leukocytosis, fever, cough or chills, rigors.  He was subsequently discharged on all of his preceding admission medicines as well with the addition of Levaquin 250 a day p.o. daily for additional 7 days.  He is to follow up in my office within 4 days' time to assess his lung status.  OTHER DISCHARGE  MEDICINES: 1. Xanax 0.5 p.o. t.i.d. 2. Norvasc 10 mg p.o. daily. 3. Coreg 12.5 mg p.o. b.i.d. 4. Catapres 0.1 mg p.o. t.i.d. 5. Cymbalta 60 mg p.o. daily. 6. Neurontin 300 mg p.o. t.i.d. 7. Hydralazine 50 mg p.o. t.i.d. 8. Labetalol 400 mg p.o. b.i.d. 9. Prinivil 20 mg p.o. daily. 10.Nephro-Vite 1 tablet p.o. daily. 11.ReQuip 2 mg p.o. nightly. 12.Crestor 20 mg p.o. daily. 13.Renagel 800 mg 2 tablets p.o. t.i.d. with meals.  He will follow up in my office in several days' time.     Melvyn Novas, MD     RMD/MEDQ  D:  02/28/2011  T:  03/01/2011  Job:  [id number]  Electronically Signed by Oval Linsey MD on 03/07/2011 03:49:05 PM

## 2011-03-07 NOTE — Discharge Summary (Addendum)
  NAME:  [name], [name] NO.:  [id number]  MEDICAL RECORD NO.:  [id number]           PATIENT TYPE:  LOCATION:                                 FACILITY:  PHYSICIAN:  Melvyn Novas, MDDATE OF BIRTH:  [birth year]  DATE OF ADMISSION: DATE OF DISCHARGE:  LH                              DISCHARGE SUMMARY   This is done from memory approximately a month after discharge.  There is very limited data to be obtained from the EMR.  Therefore, I do not known how reliable this discharge summary is.  The patient basically is a chronically noncompliant bipolar except patient with chronic renal failure on dialysis, hypertension, coronary artery disease, two-vessel disease status post stenting, diminished ejection fraction, congestive heart failure, chronic COPD, and schizoaffective disorder.  The patient was admitted with 6-hour hypertension, infiltrate on his chest x-ray, placed on Zosyn and vancomycin.  He was to receive always antihypertensive medicines as well as pulmonary toilet, and DuoNeb nebulizer therapy.  The patient was counseled to not smoke repeatedly on many hospital admissions.  His pneumonia was resolving, was multilobar, and the patient signed out AMA.  Due to fact that he signed out AMA again, on multiple episodes he was given prescription for Levaquin 250 p.o. daily for 5 days.  He was given this diminished dosage due to the fact that he has chronic renal failure, and he was again urged to stop smoking and to follow up within short order in several days at my office.     Melvyn Novas, MD     RMD/MEDQ  D:  02/24/2011  T:  02/25/2011  Job:  [id number]  Electronically Signed by Oval Linsey MD on 03/07/2011 03:49:02 PM

## 2011-03-07 NOTE — H&P (Signed)
NAME:  [name], [name]               ACCOUNT NO.:  [id number]  MEDICAL RECORD NO.:  [id number]           PATIENT TYPE:  LOCATION:                                 FACILITY:  PHYSICIAN:  [place] Flett D. Felecia Shelling, MD   DATE OF BIRTH:  02/19/1976  DATE OF ADMISSION: DATE OF DISCHARGE:  LH                             HISTORY & PHYSICAL   CHIEF COMPLAINT:  Shortness of breath.  HISTORY OF PRESENT ILLNESS:  This is a 35 year old male patient with history of end-stage renal failure on hemodialysis who was in and out of hospital, came to emergency room due to shortness of breath.  The patient was recently discharged from this hospital after he was treated for pneumonia.  He refused to take vancomycin and Zosyn.  Instead, he was on oral Levaquin and he was sent home.  He came today due to shortness of breath.  During evaluation in the emergency room, his chest x-ray showed sign of infiltrate which is consistent with pneumonia.  He was hypoxic with oxygen saturation close to 70.  The patient was started on BiPAP and combination of IV antibiotics and was admitted for further treatment.  REVIEW OF SYSTEMS:  The patient has no fever, chills, chest pain, nausea, vomiting, abdominal pain, dysuria, urgency, or frequency of urination.  PAST MEDICAL HISTORY: 1. End-stage renal disease, on hemodialysis. 2. Congestive heart failure. 3. Medical noncompliance. 4. Hypertension. 5. Coronary artery disease and is status post stent placement. 6. Depression disorder. 7. Chronic obstructive pulmonary disease. 8. Anemia. 9. Bipolar disease. 10.Hyponatremia. 11.Hyperglycemia. 12.Restless legs syndrome.  CURRENT MEDICATIONS: 1. Ambien 5 mg nightly p.r.n. 2. Carvedilol 12.5 mg b.i.d. 3. Clonidine 0.1 mg t.i.d. 4. Crestor 20 mg once a day. 5. Cymbalta 60 mg daily. 6. Gabapentin 300 mg t.i.d 7. Hydralazine 50 mg t.i.d. 8. Labetalol 50 mg b.i.d. 9. Lisinopril 20 mg b.i.d. 10.Nephro-Vite one tablet  daily. 11.Norvasc 10 mg daily. 12.Xanax 0.25 mg daily. 13.Zyprexa 10 mg b.i.d.  SOCIAL HISTORY:  The patient has a history of tobacco use.  No history of alcohol or substance abuse.  FAMILY HISTORY:  Not available at this time.  PHYSICAL EXAMINATION:  GENERAL:  The patient is acutely sick looking. He is currently on BiPAP machine. VITAL SIGNS:  Blood pressure 130/80, pulse 75, respiratory rate 18, temperature 98 degrees Fahrenheit, oxygen saturation 40% and by BiPAP 94%. HEENT:  Pupils are equal and reactive. NECK:  Supple. CHEST:  Poor air entry, bilateral rhonchi. CARDIOVASCULAR:  First and second heart sounds heard.  No murmur.  No gallop. ABDOMEN:  Soft and lax.  Bowel sound is positive.  No mass or organomegaly. EXTREMITIES:  2+ edema.  LABORATORY DATA:  ABG on 40% FIO2; pH 7.36, pCO2 of 50.9, pO2 74.8, saturation 94%.  Lipase 25.  CMP; sodium 139, potassium 4.0, chloride 101, carbon dioxide 25, glucose 104, BUN 13, creatinine 4.3, calcium 9.8.  CBC; WBC 4.4, hemoglobin 8.4, hematocrit 25.9 and platelet count is 54.  ASSESSMENT: 1. Probably healthcare-associated pneumonia. 2. End-stage renal failure, on hemodialysis. 3. History of congestive heart failure. 4. History of coronary artery disease and is status  post stent     placement. 5. Medical noncompliance. 6. Bipolar disease. 7. Hypertension.  PLAN:  We will continue the patient BiPAP.  We will continue adjusting his oxygen requirement.  We will continue combination of IV Zosyn and vancomycin.  We will do Pulmonary and Nephrology consults and to continue his regular medications.     Aquarius Tremper D. Felecia Shelling, MD     TDF/MEDQ  D:  02/24/2011  T:  02/24/2011  Job:  [id number]  Electronically Signed by Avon Gully MD on 03/07/2011 07:58:48 AM

## 2011-03-08 LAB — DIFFERENTIAL
Basophils Absolute: 0 10*3/uL (ref 0.0–0.1)
Basophils Absolute: 0.1 10*3/uL (ref 0.0–0.1)
Basophils Absolute: 0.1 10*3/uL (ref 0.0–0.1)
Basophils Relative: 0 % (ref 0–1)
Eosinophils Absolute: 0.1 10*3/uL (ref 0.0–0.7)
Eosinophils Absolute: 0.1 10*3/uL (ref 0.0–0.7)
Eosinophils Relative: 1 % (ref 0–5)
Eosinophils Relative: 2 % (ref 0–5)
Eosinophils Relative: 2 % (ref 0–5)
Lymphocytes Relative: 17 % (ref 12–46)
Lymphocytes Relative: 19 % (ref 12–46)
Lymphocytes Relative: 24 % (ref 12–46)
Lymphs Abs: 1.2 10*3/uL (ref 0.7–4.0)
Monocytes Absolute: 0.5 10*3/uL (ref 0.1–1.0)
Monocytes Absolute: 0.5 10*3/uL (ref 0.1–1.0)
Neutro Abs: 4.7 10*3/uL (ref 1.7–7.7)
Neutrophils Relative %: 73 % (ref 43–77)

## 2011-03-08 LAB — CARDIAC PANEL(CRET KIN+CKTOT+MB+TROPI)
CK, MB: 1.3 ng/mL (ref 0.3–4.0)
CK, MB: 2 ng/mL (ref 0.3–4.0)
CK, MB: 2.2 ng/mL (ref 0.3–4.0)
Relative Index: INVALID (ref 0.0–2.5)
Total CK: 44 U/L (ref 7–232)
Total CK: 47 U/L (ref 7–232)
Troponin I: 0.05 ng/mL (ref 0.00–0.06)
Troponin I: 0.05 ng/mL (ref 0.00–0.06)
Troponin I: 0.06 ng/mL (ref 0.00–0.06)
Troponin I: 0.06 ng/mL (ref 0.00–0.06)

## 2011-03-08 LAB — BASIC METABOLIC PANEL
BUN: 23 mg/dL (ref 6–23)
BUN: 28 mg/dL — ABNORMAL HIGH (ref 6–23)
BUN: 64 mg/dL — ABNORMAL HIGH (ref 6–23)
CO2: 19 mEq/L (ref 19–32)
Calcium: 8.6 mg/dL (ref 8.4–10.5)
Chloride: 86 mEq/L — ABNORMAL LOW (ref 96–112)
Chloride: 90 mEq/L — ABNORMAL LOW (ref 96–112)
Creatinine, Ser: 6.44 mg/dL — ABNORMAL HIGH (ref 0.4–1.5)
Creatinine, Ser: 6.69 mg/dL — ABNORMAL HIGH (ref 0.4–1.5)
GFR calc non Af Amer: 10 mL/min — ABNORMAL LOW (ref >60)
GFR calc non Af Amer: 5 mL/min — ABNORMAL LOW (ref >60)
Glucose, Bld: 108 mg/dL — ABNORMAL HIGH (ref 70–99)
Glucose, Bld: 93 mg/dL (ref 70–99)
Potassium: 4.1 mEq/L (ref 3.5–5.1)
Potassium: 4.6 mEq/L (ref 3.5–5.1)
Potassium: 4.6 mEq/L (ref 3.5–5.1)
Sodium: 124 mEq/L — ABNORMAL LOW (ref 135–145)

## 2011-03-08 LAB — CULTURE, BLOOD (ROUTINE X 2)
Culture: NO GROWTH
Report Status: 5192011

## 2011-03-08 LAB — POCT CARDIAC MARKERS
CKMB, poc: <1 ng/mL — ABNORMAL LOW (ref 1.0–8.0)
Myoglobin, poc: 168 ng/mL (ref 12–200)

## 2011-03-08 LAB — COMPREHENSIVE METABOLIC PANEL
AST: 17 U/L (ref 0–37)
Albumin: 3.2 g/dL — ABNORMAL LOW (ref 3.5–5.2)
Alkaline Phosphatase: 179 U/L — ABNORMAL HIGH (ref 39–117)
Chloride: 97 mEq/L (ref 96–112)
Creatinine, Ser: 5.97 mg/dL — ABNORMAL HIGH (ref 0.4–1.5)
GFR calc Af Amer: 13 mL/min — ABNORMAL LOW (ref >60)
Potassium: 4.7 mEq/L (ref 3.5–5.1)
Sodium: 133 mEq/L — ABNORMAL LOW (ref 135–145)
Total Bilirubin: 0.6 mg/dL (ref 0.3–1.2)

## 2011-03-08 LAB — CBC
HCT: 29.1 % — ABNORMAL LOW (ref 39.0–52.0)
HCT: 29.8 % — ABNORMAL LOW (ref 39.0–52.0)
Hemoglobin: 10.5 g/dL — ABNORMAL LOW (ref 13.0–17.0)
MCHC: 36 g/dL (ref 30.0–36.0)
MCV: 88.4 fL (ref 78.0–100.0)
MCV: 90 fL (ref 78.0–100.0)
Platelets: 175 10*3/uL (ref 150–400)
Platelets: 254 10*3/uL (ref 150–400)
Platelets: 297 10*3/uL (ref 150–400)
RDW: 18.4 % — ABNORMAL HIGH (ref 11.5–15.5)
RDW: 18.6 % — ABNORMAL HIGH (ref 11.5–15.5)
WBC: 6.5 10*3/uL (ref 4.0–10.5)
WBC: 6.8 10*3/uL (ref 4.0–10.5)

## 2011-03-08 LAB — MRSA PCR SCREENING: MRSA by PCR: NEGATIVE

## 2011-03-08 LAB — BRAIN NATRIURETIC PEPTIDE: Pro B Natriuretic peptide (BNP): 2870 pg/mL — ABNORMAL HIGH (ref 0.0–100.0)

## 2011-03-08 NOTE — Letter (Signed)
Summary: [facility]  [facility]   Imported By: Rexene Alberts 03/01/2011 11:30:46  _____________________________________________________________________  External Attachment:    Type:   Image     Comment:   External Document

## 2011-03-11 ENCOUNTER — Emergency Department (HOSPITAL_COMMUNITY)
Admission: EM | Admit: 2011-03-11 | Discharge: 2011-03-11 | Disposition: A | Discharge status: Home / Self Care | Payer: Medicare Other | Attending: Emergency Medicine | Admitting: Emergency Medicine

## 2011-03-11 DIAGNOSIS — J4489 Other specified chronic obstructive pulmonary disease: Secondary | ICD-10-CM | POA: Insufficient documentation

## 2011-03-11 DIAGNOSIS — Z79899 Other long term (current) drug therapy: Secondary | ICD-10-CM | POA: Insufficient documentation

## 2011-03-11 DIAGNOSIS — J449 Chronic obstructive pulmonary disease, unspecified: Secondary | ICD-10-CM | POA: Insufficient documentation

## 2011-03-11 DIAGNOSIS — I2510 Atherosclerotic heart disease of native coronary artery without angina pectoris: Secondary | ICD-10-CM | POA: Insufficient documentation

## 2011-03-11 DIAGNOSIS — R112 Nausea with vomiting, unspecified: Secondary | ICD-10-CM | POA: Insufficient documentation

## 2011-03-11 DIAGNOSIS — R109 Unspecified abdominal pain: Secondary | ICD-10-CM | POA: Insufficient documentation

## 2011-03-11 DIAGNOSIS — I120 Hypertensive chronic kidney disease with stage 5 chronic kidney disease or end stage renal disease: Secondary | ICD-10-CM | POA: Insufficient documentation

## 2011-03-11 DIAGNOSIS — N186 End stage renal disease: Secondary | ICD-10-CM | POA: Insufficient documentation

## 2011-03-11 LAB — DIFFERENTIAL
Basophils Absolute: 0 10*3/uL (ref 0.0–0.1)
Eosinophils Absolute: 0.2 10*3/uL (ref 0.0–0.7)
Lymphs Abs: 1.9 10*3/uL (ref 0.7–4.0)
Neutrophils Relative %: 59 % (ref 43–77)

## 2011-03-11 LAB — URINALYSIS, ROUTINE W REFLEX MICROSCOPIC
Bilirubin Urine: NEGATIVE
Glucose, UA: 100 mg/dL — AB
Ketones, ur: NEGATIVE mg/dL
Leukocytes, UA: NEGATIVE
Protein, ur: 30 mg/dL — AB
pH: 7 (ref 5.0–8.0)

## 2011-03-11 LAB — COMPREHENSIVE METABOLIC PANEL
ALT: 10 U/L (ref 0–53)
AST: 16 U/L (ref 0–37)
Albumin: 3.9 g/dL (ref 3.5–5.2)
Alkaline Phosphatase: 87 U/L (ref 39–117)
BUN: 17 mg/dL (ref 6–23)
Chloride: 97 mEq/L (ref 96–112)
GFR calc Af Amer: 13 mL/min — ABNORMAL LOW (ref >60)
Potassium: 3.9 mEq/L (ref 3.5–5.1)
Sodium: 136 mEq/L (ref 135–145)
Total Bilirubin: 0.8 mg/dL (ref 0.3–1.2)
Total Protein: 6.7 g/dL (ref 6.0–8.3)

## 2011-03-11 LAB — CBC
MCV: 86.3 fL (ref 78.0–100.0)
Platelets: 244 10*3/uL (ref 150–400)
RBC: 3.71 MIL/uL — ABNORMAL LOW (ref 4.22–5.81)
RDW: 18.4 % — ABNORMAL HIGH (ref 11.5–15.5)
WBC: 6.5 10*3/uL (ref 4.0–10.5)

## 2011-03-12 ENCOUNTER — Emergency Department (HOSPITAL_COMMUNITY)
Admission: EM | Admit: 2011-03-12 | Discharge: 2011-03-12 | Discharge status: Against Medical Advice | Payer: Medicare Other | Attending: Emergency Medicine | Admitting: Emergency Medicine

## 2011-03-12 DIAGNOSIS — R109 Unspecified abdominal pain: Secondary | ICD-10-CM | POA: Insufficient documentation

## 2011-03-12 DIAGNOSIS — M549 Dorsalgia, unspecified: Secondary | ICD-10-CM | POA: Insufficient documentation

## 2011-03-13 ENCOUNTER — Emergency Department (HOSPITAL_COMMUNITY)
Admission: EM | Admit: 2011-03-13 | Discharge: 2011-03-13 | Disposition: A | Discharge status: Home / Self Care | Payer: Medicare Other | Attending: Emergency Medicine | Admitting: Emergency Medicine

## 2011-03-13 DIAGNOSIS — N186 End stage renal disease: Secondary | ICD-10-CM | POA: Insufficient documentation

## 2011-03-13 DIAGNOSIS — J449 Chronic obstructive pulmonary disease, unspecified: Secondary | ICD-10-CM | POA: Insufficient documentation

## 2011-03-13 DIAGNOSIS — F3289 Other specified depressive episodes: Secondary | ICD-10-CM | POA: Insufficient documentation

## 2011-03-13 DIAGNOSIS — J4489 Other specified chronic obstructive pulmonary disease: Secondary | ICD-10-CM | POA: Insufficient documentation

## 2011-03-13 DIAGNOSIS — R109 Unspecified abdominal pain: Secondary | ICD-10-CM | POA: Insufficient documentation

## 2011-03-13 DIAGNOSIS — F329 Major depressive disorder, single episode, unspecified: Secondary | ICD-10-CM | POA: Insufficient documentation

## 2011-03-13 DIAGNOSIS — I120 Hypertensive chronic kidney disease with stage 5 chronic kidney disease or end stage renal disease: Secondary | ICD-10-CM | POA: Insufficient documentation

## 2011-03-13 DIAGNOSIS — K2900 Acute gastritis without bleeding: Secondary | ICD-10-CM | POA: Insufficient documentation

## 2011-03-13 DIAGNOSIS — I2510 Atherosclerotic heart disease of native coronary artery without angina pectoris: Secondary | ICD-10-CM | POA: Insufficient documentation

## 2011-03-13 DIAGNOSIS — Z8659 Personal history of other mental and behavioral disorders: Secondary | ICD-10-CM | POA: Insufficient documentation

## 2011-03-13 LAB — CARDIAC PANEL(CRET KIN+CKTOT+MB+TROPI)
CK, MB: 2.9 ng/mL (ref 0.3–4.0)
CK, MB: 3.1 ng/mL (ref 0.3–4.0)
Relative Index: INVALID (ref 0.0–2.5)
Total CK: 51 U/L (ref 7–232)
Total CK: 52 U/L (ref 7–232)
Troponin I: 0.06 ng/mL (ref 0.00–0.06)

## 2011-03-13 LAB — CBC
HCT: 34.6 % — ABNORMAL LOW (ref 39.0–52.0)
Hemoglobin: 11.8 g/dL — ABNORMAL LOW (ref 13.0–17.0)
Hemoglobin: 9.1 g/dL — ABNORMAL LOW (ref 13.0–17.0)
MCV: 87.7 fL (ref 78.0–100.0)
MCV: 89 fL (ref 78.0–100.0)
Platelets: 212 10*3/uL (ref 150–400)
RBC: 2.96 MIL/uL — ABNORMAL LOW (ref 4.22–5.81)
RDW: 16.3 % — ABNORMAL HIGH (ref 11.5–15.5)
WBC: 6.2 10*3/uL (ref 4.0–10.5)

## 2011-03-13 LAB — COMPREHENSIVE METABOLIC PANEL
AST: 18 U/L (ref 0–37)
Albumin: 2.8 g/dL — ABNORMAL LOW (ref 3.5–5.2)
Albumin: 3.7 g/dL (ref 3.5–5.2)
Alkaline Phosphatase: 165 U/L — ABNORMAL HIGH (ref 39–117)
BUN: 37 mg/dL — ABNORMAL HIGH (ref 6–23)
Calcium: 8 mg/dL — ABNORMAL LOW (ref 8.4–10.5)
Calcium: 10 mg/dL (ref 8.4–10.5)
Creatinine, Ser: 7.32 mg/dL — ABNORMAL HIGH (ref 0.4–1.5)
Creatinine, Ser: 5.56 mg/dL — ABNORMAL HIGH (ref 0.4–1.5)
GFR calc Af Amer: 14 mL/min — ABNORMAL LOW (ref >60)
Glucose, Bld: 171 mg/dL — ABNORMAL HIGH (ref 70–99)
Total Protein: 5.3 g/dL — ABNORMAL LOW (ref 6.0–8.3)

## 2011-03-13 LAB — BASIC METABOLIC PANEL
BUN: 34 mg/dL — ABNORMAL HIGH (ref 6–23)
Chloride: 86 mEq/L — ABNORMAL LOW (ref 96–112)
Chloride: 93 mEq/L — ABNORMAL LOW (ref 96–112)
Creatinine, Ser: 4.66 mg/dL — ABNORMAL HIGH (ref 0.4–1.5)
GFR calc Af Amer: 17 mL/min — ABNORMAL LOW (ref >60)
GFR calc non Af Amer: 9 mL/min — ABNORMAL LOW (ref >60)
Glucose, Bld: 93 mg/dL (ref 70–99)
Potassium: 4.4 mEq/L (ref 3.5–5.1)
Potassium: 5.6 mEq/L — ABNORMAL HIGH (ref 3.5–5.1)
Sodium: 117 mEq/L — CL (ref 135–145)
Sodium: 127 mEq/L — ABNORMAL LOW (ref 135–145)

## 2011-03-13 LAB — DIFFERENTIAL
Eosinophils Absolute: 0.1 10*3/uL (ref 0.0–0.7)
Eosinophils Absolute: 0.1 10*3/uL (ref 0.0–0.7)
Eosinophils Relative: 2 % (ref 0–5)
Lymphocytes Relative: 13 % (ref 12–46)
Lymphs Abs: 1.1 10*3/uL (ref 0.7–4.0)
Lymphs Abs: 1.2 10*3/uL (ref 0.7–4.0)
Monocytes Absolute: 0.3 10*3/uL (ref 0.1–1.0)
Monocytes Absolute: 0.4 10*3/uL (ref 0.1–1.0)
Monocytes Relative: 7 % (ref 3–12)
Neutrophils Relative %: 73 % (ref 43–77)

## 2011-03-13 LAB — POCT CARDIAC MARKERS
CKMB, poc: 1.1 ng/mL (ref 1.0–8.0)
Myoglobin, poc: 291 ng/mL (ref 12–200)
Troponin i, poc: <0.05 ng/mL (ref 0.00–0.09)
Troponin i, poc: <0.05 ng/mL (ref 0.00–0.09)

## 2011-03-13 LAB — TROPONIN I: Troponin I: 0.06 ng/mL (ref 0.00–0.06)

## 2011-03-13 LAB — CK TOTAL AND CKMB (NOT AT ARMC)
CK, MB: 3.1 ng/mL (ref 0.3–4.0)
Total CK: 60 U/L (ref 7–232)

## 2011-03-13 LAB — CORTISOL-AM, BLOOD: Cortisol - AM: 1.8 ug/dL — ABNORMAL LOW (ref 4.3–22.4)

## 2011-03-14 ENCOUNTER — Emergency Department (HOSPITAL_COMMUNITY)
Admission: EM | Admit: 2011-03-14 | Discharge: 2011-03-14 | Disposition: A | Discharge status: Home / Self Care | Payer: Medicare Other | Attending: Emergency Medicine | Admitting: Emergency Medicine

## 2011-03-14 DIAGNOSIS — Z79899 Other long term (current) drug therapy: Secondary | ICD-10-CM | POA: Insufficient documentation

## 2011-03-14 DIAGNOSIS — I2510 Atherosclerotic heart disease of native coronary artery without angina pectoris: Secondary | ICD-10-CM | POA: Insufficient documentation

## 2011-03-14 DIAGNOSIS — N186 End stage renal disease: Secondary | ICD-10-CM | POA: Insufficient documentation

## 2011-03-14 DIAGNOSIS — N390 Urinary tract infection, site not specified: Secondary | ICD-10-CM | POA: Insufficient documentation

## 2011-03-14 DIAGNOSIS — J45909 Unspecified asthma, uncomplicated: Secondary | ICD-10-CM | POA: Insufficient documentation

## 2011-03-14 DIAGNOSIS — R109 Unspecified abdominal pain: Secondary | ICD-10-CM | POA: Insufficient documentation

## 2011-03-14 DIAGNOSIS — I120 Hypertensive chronic kidney disease with stage 5 chronic kidney disease or end stage renal disease: Secondary | ICD-10-CM | POA: Insufficient documentation

## 2011-03-14 LAB — COMPREHENSIVE METABOLIC PANEL
ALT: 12 U/L (ref 0–53)
Alkaline Phosphatase: 83 U/L (ref 39–117)
BUN: 29 mg/dL — ABNORMAL HIGH (ref 6–23)
CO2: 25 mEq/L (ref 19–32)
GFR calc non Af Amer: 8 mL/min — ABNORMAL LOW (ref >60)
Glucose, Bld: 97 mg/dL (ref 70–99)
Potassium: 4.7 mEq/L (ref 3.5–5.1)
Sodium: 133 mEq/L — ABNORMAL LOW (ref 135–145)

## 2011-03-14 LAB — URINALYSIS, ROUTINE W REFLEX MICROSCOPIC
Glucose, UA: 100 mg/dL — AB
Ketones, ur: NEGATIVE mg/dL
Leukocytes, UA: NEGATIVE
Protein, ur: 30 mg/dL — AB
Urobilinogen, UA: 0.2 mg/dL (ref 0.0–1.0)

## 2011-03-14 LAB — DIFFERENTIAL
Basophils Absolute: 0 10*3/uL (ref 0.0–0.1)
Basophils Relative: 0 % (ref 0–1)
Eosinophils Absolute: 0.2 10*3/uL (ref 0.0–0.7)
Eosinophils Relative: 3 % (ref 0–5)
Neutrophils Relative %: 72 % (ref 43–77)

## 2011-03-14 LAB — CBC
Platelets: 218 10*3/uL (ref 150–400)
RBC: 3.47 MIL/uL — ABNORMAL LOW (ref 4.22–5.81)
RDW: 18.3 % — ABNORMAL HIGH (ref 11.5–15.5)
WBC: 7 10*3/uL (ref 4.0–10.5)

## 2011-03-14 LAB — URINE MICROSCOPIC-ADD ON

## 2011-03-14 LAB — LIPASE, BLOOD: Lipase: 27 U/L (ref 11–59)

## 2011-03-17 NOTE — Consult Note (Signed)
NAME:  [name], [name]               ACCOUNT NO.:  [id number]  MEDICAL RECORD NO.:  [id number]           PATIENT TYPE:  LOCATION:                                 FACILITY:  PHYSICIAN:  Jorja Loa, M.D.DATE OF BIRTH:  [birth year]  DATE OF CONSULTATION: DATE OF DISCHARGE:                                CONSULTATION   REASON FOR CONSULT:  End-stage renal disease and also CHF.  This is one of multiple admissions for [name] who has history of end- stage renal disease, on maintenance hemodialysis, noncompliant with fluid and also with diet, presently came with complaints of shortness of breath, orthopnea, paroxysmal nocturnal dyspnea and he was found to be in CHF and also still with a possible pneumonia.  The patient was here about 2 weeks ago and during that time, he came with similar problem and he was found also to have pneumonia.  He was started on antibiotics dialyzed; however, the patient sign out against medical advice and came back presently with similar issue.  He denies any nausea or vomiting. He denies any chest pain.  PAST MEDICAL HISTORY: 1. He has a history of recurrent CHF. 2. History of end-stage renal disease.  He is on maintenance     hemodialysis Tuesday, Thursday, Saturday. 3. History of coronary artery disease, status post stent placement. 4. History of hypertension. 5. History of bipolar disorder. 6. History of depression. 7. History of stable angina. 8. History of chronic pain. 9. History of restless legs syndrome. 10.History of anemia. 11.History of dyslipidemia.  SOCIAL HISTORY:  He has history of smoking, but denies any history of illicit drug abuse or alcohol abuse at this moment.  MEDICATIONS: 1. Xanax 0.5 mg p.o. t.i.d. 2. Norvasc 10 mg p.o. daily. 3. Coreg 12.5 mg p.o. b.i.d. 4. Catapres 0.1 mg p.o. t.i.d. 5. Cymbalta 60 mg p.o. daily. 6. Neurontin 300 mg p.o. t.i.d. 7. Hydralazine 50 mg p.o. t.i.d. 8. Normodyne 50 mg p.o. b.i.d. 9.  Prinivil 20 mg p.o. daily. 10.Zosyn 2.5 9 g IV q.8 h. 11.Nephro-Vite one tablet p.o. daily. 12.ReQuip 2 mg p.o. nightly. 13.Crestor 20 mg p.o. daily. 14.Renagel 800 mg two tablets p.o. t.i.d. with meals.  ALLERGIES:  He is allergic to IBUPROFEN, NAPROXEN, LITHIUM, PHENYTOIN, METHADONE, ULTRAM, ZOCOR and KETOROLAC.  REVIEW OF SYSTEMS:  Main complaint seems to be shortness of breath, orthopnea, some paroxysmal nocturnal dyspnea.  He denies any chest pain. He denies any nausea, no vomiting.  He denies also any diarrhea.  He denies also any fever.  He said he is about two pillows when he is lying down.  PHYSICAL EXAMINATION:  VITAL SIGNS:  His heart rate is 65, blood pressure 172/100. HEENT:  He has no conjunctival pallor, patchy sclerae and also he has no conjunctival pallor, no icterus.  Oral mucosa seems to be moist. CHEST:  He has expiratory crackles bilaterally. HEART:  Reveals regular rate and rhythm.  No murmur.  No S3. ABDOMEN:  Soft, positive bowel sounds. EXTREMITIES:  He has been about 2+ edema.  His blood work showed his blood gas from yesterday pH 7.368, pCO2 of 74.8, O2 saturation was  94.1.  His white blood cell count is 4.4, hemoglobin 8.4, hematocrit is 25.8.  Sodium 159, potassium 4, BUN is 17, creatinine 4.33.  His albumin is 3.5.  He has a chest x-ray which basically showed cardiomegaly and worsening of left airspace disease.  ASSESSMENT: 1. Recurrent congestive heart failure, noncompliant with his fluid.     Most of the time he gets very significant amount of fluid where it     becomes very difficult even to move in dialysis unit.     Occasionally, we offered him to come for extra dialysis.  However,     presently still he came with a similar problem.  However, he has     also still lung infiltrate.  He was started on antibiotics     previously.  He signed against medical advise and he was put on     p.o. antibiotics. 2. History of end-stage renal disease.  He  is status post hemodialysis     on Tuesday.  He was supposed to be dialyzed yesterday with BUN and     creatinine seems an acceptable range, normal potassium. 3. History of chronic obstructive pulmonary disease.  He continued to     smoke. 4. History of coronary artery disease, status post stent placement     symptomatic. 5. History of bipolar disorder. 6. History of depression. 7. Stable angina. 8. History of anemia.  He is on Epogen.  H and H seems to be     declining. 9. History of restless legs syndrome. 10.History of hyponatremia. 11.History of hypertension, blood pressure seems to be somewhat high.  RECOMMENDATIONS:  We will make arrangement for the patient to get dialysis today and I will possibly try to get about 4 liters and I will change his Normodyne to 400 mg p.o. b.i.d. and we will check his phosphorus.  We will continue his other medications.  Still continue to advise him to decrease his fluid and salt intake.     Jorja Loa, M.D.     BB/MEDQ  D:  02/25/2011  T:  02/25/2011  Job:  [id number]  Electronically Signed by Jorja Loa M.D. on 03/17/2011 09:07:45 AM

## 2011-03-21 ENCOUNTER — Emergency Department (HOSPITAL_COMMUNITY)
Admission: EM | Admit: 2011-03-21 | Discharge: 2011-03-22 | Disposition: A | Discharge status: Home / Self Care | Payer: Medicare Other | Attending: Emergency Medicine | Admitting: Emergency Medicine

## 2011-03-21 ENCOUNTER — Emergency Department (HOSPITAL_COMMUNITY): Payer: Medicare Other

## 2011-03-21 DIAGNOSIS — F3289 Other specified depressive episodes: Secondary | ICD-10-CM | POA: Insufficient documentation

## 2011-03-21 DIAGNOSIS — Z992 Dependence on renal dialysis: Secondary | ICD-10-CM | POA: Insufficient documentation

## 2011-03-21 DIAGNOSIS — J4489 Other specified chronic obstructive pulmonary disease: Secondary | ICD-10-CM | POA: Insufficient documentation

## 2011-03-21 DIAGNOSIS — J449 Chronic obstructive pulmonary disease, unspecified: Secondary | ICD-10-CM | POA: Insufficient documentation

## 2011-03-21 DIAGNOSIS — Z79899 Other long term (current) drug therapy: Secondary | ICD-10-CM | POA: Insufficient documentation

## 2011-03-21 DIAGNOSIS — R109 Unspecified abdominal pain: Secondary | ICD-10-CM | POA: Insufficient documentation

## 2011-03-21 DIAGNOSIS — I2510 Atherosclerotic heart disease of native coronary artery without angina pectoris: Secondary | ICD-10-CM | POA: Insufficient documentation

## 2011-03-21 DIAGNOSIS — N186 End stage renal disease: Secondary | ICD-10-CM | POA: Insufficient documentation

## 2011-03-21 DIAGNOSIS — F329 Major depressive disorder, single episode, unspecified: Secondary | ICD-10-CM | POA: Insufficient documentation

## 2011-03-21 DIAGNOSIS — I120 Hypertensive chronic kidney disease with stage 5 chronic kidney disease or end stage renal disease: Secondary | ICD-10-CM | POA: Insufficient documentation

## 2011-03-21 LAB — COMPREHENSIVE METABOLIC PANEL
ALT: 10 U/L (ref 0–53)
Albumin: 3 g/dL — ABNORMAL LOW (ref 3.5–5.2)
Alkaline Phosphatase: 81 U/L (ref 39–117)
Chloride: 98 mEq/L (ref 96–112)
Glucose, Bld: 86 mg/dL (ref 70–99)
Potassium: 4.3 mEq/L (ref 3.5–5.1)
Sodium: 131 mEq/L — ABNORMAL LOW (ref 135–145)
Total Protein: 5.4 g/dL — ABNORMAL LOW (ref 6.0–8.3)

## 2011-03-21 LAB — DIFFERENTIAL
Basophils Absolute: 0 10*3/uL (ref 0.0–0.1)
Eosinophils Relative: 5 % (ref 0–5)
Lymphocytes Relative: 24 % (ref 12–46)
Neutro Abs: 3.1 10*3/uL (ref 1.7–7.7)
Neutrophils Relative %: 65 % (ref 43–77)

## 2011-03-21 LAB — CBC
HCT: 26 % — ABNORMAL LOW (ref 39.0–52.0)
Hemoglobin: 8.4 g/dL — ABNORMAL LOW (ref 13.0–17.0)
RBC: 3.05 MIL/uL — ABNORMAL LOW (ref 4.22–5.81)
RDW: 18.4 % — ABNORMAL HIGH (ref 11.5–15.5)
WBC: 4.7 10*3/uL (ref 4.0–10.5)

## 2011-03-21 IMAGING — CR DG CHEST 1V PORT
1 series · 1 of 1 positions shown · non-contrast
Comparison: Chest radiograph performed 02/06/2009

CLINICAL DATA: Decreased O2 saturation, shortness of breath and
wheezing.

PORTABLE CHEST - 1 VIEW

[AP]
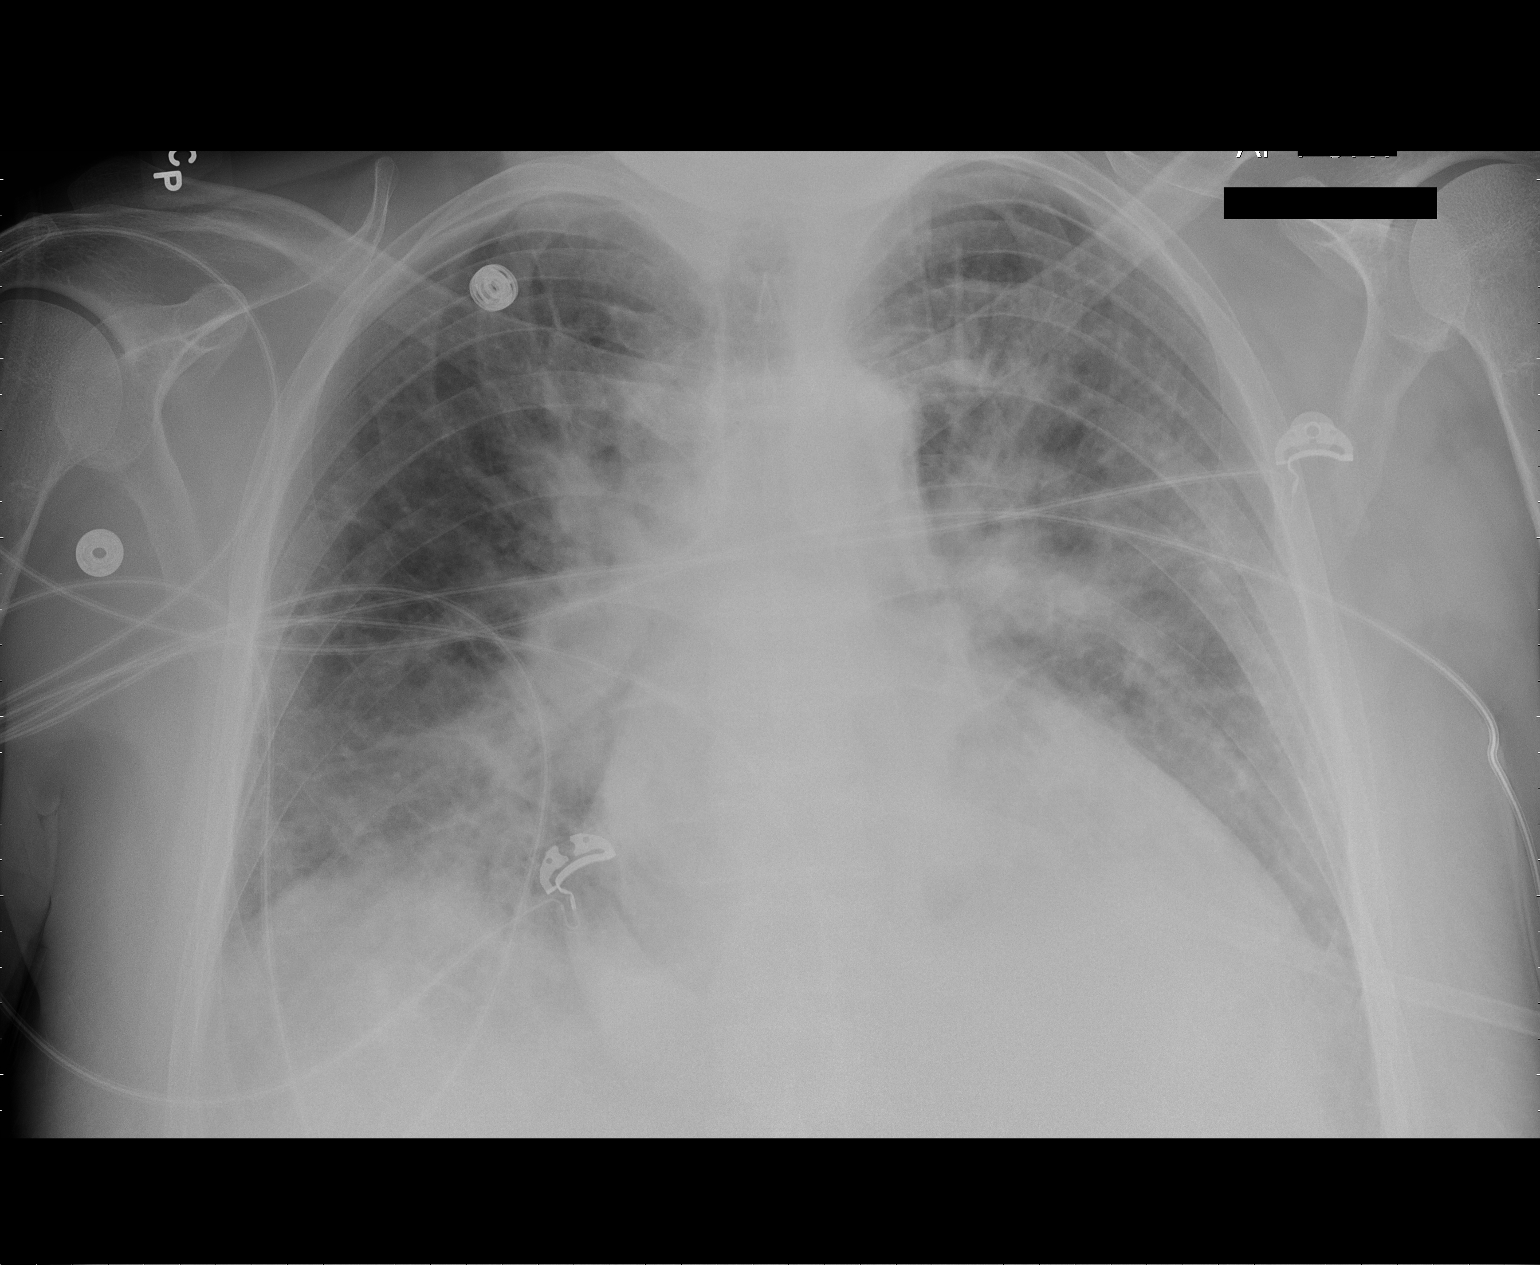

[1 of 1 positions shown; findings below may reference images not displayed]

FINDINGS: There is marked central airspace opacification with
prominence of the central pulmonary vasculature, compatible with
significant pulmonary edema.  Small bilateral pleural effusions are
likely present.  No pneumothorax is seen.

The cardiomediastinal silhouette is enlarged.  No acute osseous
abnormalities are identified.
IMPRESSION: Significant pulmonary edema with likely small bilateral pleural
effusions.

## 2011-03-22 ENCOUNTER — Other Ambulatory Visit: Payer: Self-pay | Admitting: Internal Medicine

## 2011-03-22 LAB — URINALYSIS, ROUTINE W REFLEX MICROSCOPIC
Glucose, UA: 100 mg/dL — AB
Ketones, ur: NEGATIVE mg/dL
Protein, ur: 30 mg/dL — AB

## 2011-03-22 IMAGING — CR DG CHEST 1V PORT
1 series · 1 of 1 positions shown · non-contrast
Comparison: 09/06/2009

CLINICAL DATA: Acute coronary syndrome.  Follow-up CHF.

PORTABLE CHEST - 1 VIEW

[view not recorded]
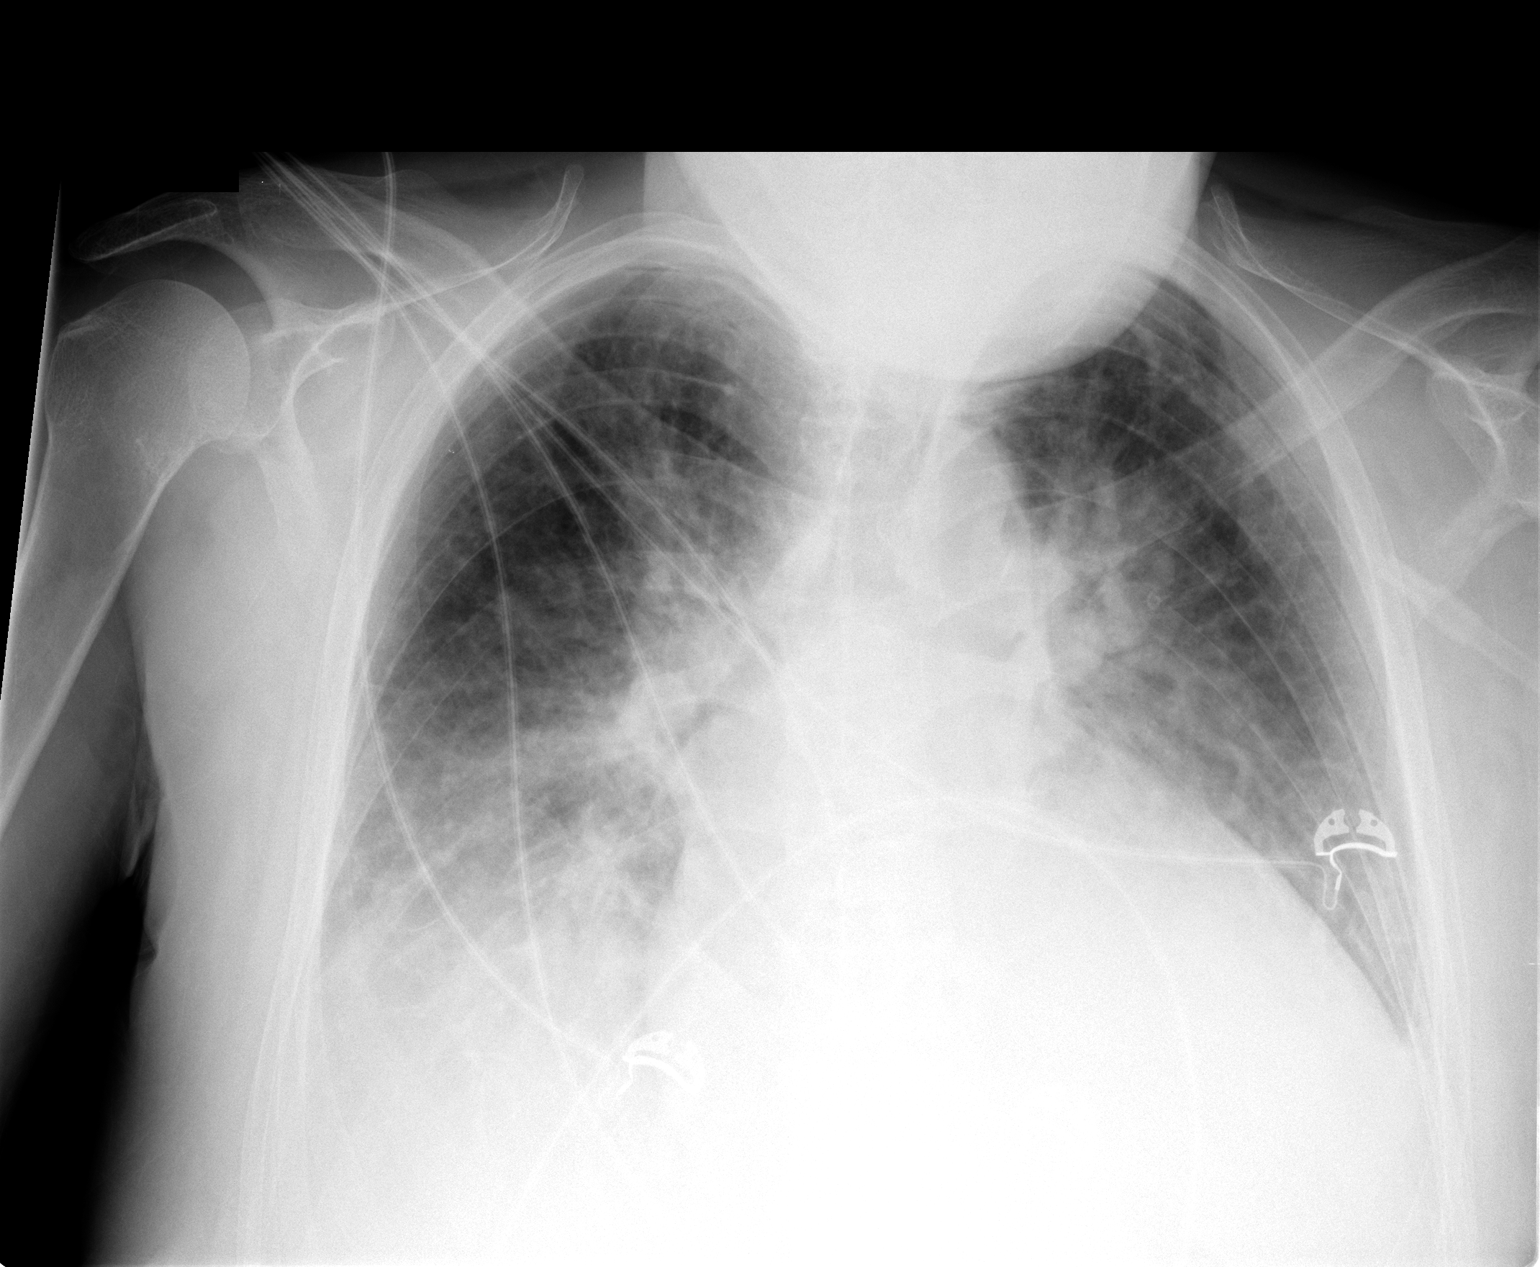

[1 of 1 positions shown; findings below may reference images not displayed]

FINDINGS: Heart is enlarged.  There are bilateral perihilar and
bibasilar infiltrates consistent with edema.  Bilateral pleural
effusions are present.  Opacities at the bases obscure the
hemidiaphragms bilaterally.  Overall, the appearance is stable.
IMPRESSION: Little interval change.  Congestive heart failure.

## 2011-03-24 LAB — POCT I-STAT 3, ART BLOOD GAS (G3+)
Bicarbonate: 19.8 mEq/L — ABNORMAL LOW (ref 20.0–24.0)
O2 Saturation: 93 %
TCO2: 21 mmol/L (ref 0–100)
pH, Arterial: 7.321 — ABNORMAL LOW (ref 7.350–7.450)

## 2011-03-24 LAB — RAPID URINE DRUG SCREEN, HOSP PERFORMED
Barbiturates: NOT DETECTED
Cocaine: NOT DETECTED
Opiates: POSITIVE — AB

## 2011-03-24 LAB — DIFFERENTIAL
Basophils Absolute: 0 10*3/uL (ref 0.0–0.1)
Basophils Relative: 1 % (ref 0–1)
Eosinophils Relative: 2 % (ref 0–5)
Monocytes Absolute: 0.5 10*3/uL (ref 0.1–1.0)
Monocytes Relative: 6 % (ref 3–12)
Neutro Abs: 6.5 10*3/uL (ref 1.7–7.7)

## 2011-03-24 LAB — CBC
Hemoglobin: 11.9 g/dL — ABNORMAL LOW (ref 13.0–17.0)
Platelets: 303 10*3/uL (ref 150–400)
RBC: 3.9 MIL/uL — ABNORMAL LOW (ref 4.22–5.81)
RDW: 19.6 % — ABNORMAL HIGH (ref 11.5–15.5)

## 2011-03-24 LAB — URINALYSIS, ROUTINE W REFLEX MICROSCOPIC
Bilirubin Urine: NEGATIVE
Ketones, ur: 15 mg/dL — AB
Specific Gravity, Urine: 1.006 (ref 1.005–1.030)
pH: 8 (ref 5.0–8.0)

## 2011-03-24 LAB — COMPREHENSIVE METABOLIC PANEL
ALT: 10 U/L (ref 0–53)
AST: 16 U/L (ref 0–37)
Albumin: 3.7 g/dL (ref 3.5–5.2)
Alkaline Phosphatase: 194 U/L — ABNORMAL HIGH (ref 39–117)
BUN: 34 mg/dL — ABNORMAL HIGH (ref 6–23)
Chloride: 98 mEq/L (ref 96–112)
GFR calc Af Amer: 9 mL/min — ABNORMAL LOW (ref >60)
Potassium: 6.1 mEq/L — ABNORMAL HIGH (ref 3.5–5.1)
Sodium: 133 mEq/L — ABNORMAL LOW (ref 135–145)
Total Bilirubin: 0.8 mg/dL (ref 0.3–1.2)
Total Protein: 6.9 g/dL (ref 6.0–8.3)

## 2011-03-24 LAB — POCT I-STAT 4, (NA,K, GLUC, HGB,HCT)
Glucose, Bld: 200 mg/dL — ABNORMAL HIGH (ref 70–99)
HCT: 33 % — ABNORMAL LOW (ref 39.0–52.0)

## 2011-03-24 LAB — URINE MICROSCOPIC-ADD ON

## 2011-03-24 LAB — POCT CARDIAC MARKERS: Troponin i, poc: <0.05 ng/mL (ref 0.00–0.09)

## 2011-03-24 LAB — HEPATITIS B SURFACE ANTIGEN: Hepatitis B Surface Ag: NEGATIVE

## 2011-03-25 LAB — DIFFERENTIAL
Basophils Absolute: 0 10*3/uL (ref 0.0–0.1)
Eosinophils Absolute: 0.2 10*3/uL (ref 0.0–0.7)
Eosinophils Relative: 3 % (ref 0–5)
Lymphocytes Relative: 21 % (ref 12–46)
Lymphocytes Relative: 31 % (ref 12–46)
Lymphs Abs: 1.3 10*3/uL (ref 0.7–4.0)
Monocytes Absolute: 0.5 10*3/uL (ref 0.1–1.0)
Monocytes Relative: 10 % (ref 3–12)
Monocytes Relative: 8 % (ref 3–12)
Neutro Abs: 2.6 10*3/uL (ref 1.7–7.7)

## 2011-03-25 LAB — GLUCOSE, CAPILLARY
Glucose-Capillary: 105 mg/dL — ABNORMAL HIGH (ref 70–99)
Glucose-Capillary: 79 mg/dL (ref 70–99)
Glucose-Capillary: 82 mg/dL (ref 70–99)
Glucose-Capillary: 84 mg/dL (ref 70–99)
Glucose-Capillary: 98 mg/dL (ref 70–99)

## 2011-03-25 LAB — RENAL FUNCTION PANEL
Albumin: 2.7 g/dL — ABNORMAL LOW (ref 3.5–5.2)
GFR calc Af Amer: 13 mL/min — ABNORMAL LOW (ref >60)
GFR calc non Af Amer: 11 mL/min — ABNORMAL LOW (ref >60)
Glucose, Bld: 94 mg/dL (ref 70–99)
Phosphorus: 5 mg/dL — ABNORMAL HIGH (ref 2.3–4.6)
Potassium: 4.7 mEq/L (ref 3.5–5.1)
Sodium: 138 mEq/L (ref 135–145)

## 2011-03-25 LAB — BASIC METABOLIC PANEL
BUN: 24 mg/dL — ABNORMAL HIGH (ref 6–23)
BUN: 24 mg/dL — ABNORMAL HIGH (ref 6–23)
CO2: 27 mEq/L (ref 19–32)
CO2: 29 mEq/L (ref 19–32)
CO2: 30 mEq/L (ref 19–32)
Calcium: 8.6 mg/dL (ref 8.4–10.5)
Calcium: 9.8 mg/dL (ref 8.4–10.5)
Chloride: 97 mEq/L (ref 96–112)
Chloride: 98 mEq/L (ref 96–112)
Creatinine, Ser: 6.94 mg/dL — ABNORMAL HIGH (ref 0.4–1.5)
Creatinine, Ser: 6.96 mg/dL — ABNORMAL HIGH (ref 0.4–1.5)
Creatinine, Ser: 7.3 mg/dL — ABNORMAL HIGH (ref 0.4–1.5)
GFR calc Af Amer: 10 mL/min — ABNORMAL LOW (ref >60)
GFR calc Af Amer: 11 mL/min — ABNORMAL LOW (ref >60)
GFR calc Af Amer: 16 mL/min — ABNORMAL LOW (ref >60)
GFR calc non Af Amer: 8 mL/min — ABNORMAL LOW (ref >60)
GFR calc non Af Amer: 9 mL/min — ABNORMAL LOW (ref >60)
GFR calc non Af Amer: 9 mL/min — ABNORMAL LOW (ref >60)
Glucose, Bld: 101 mg/dL — ABNORMAL HIGH (ref 70–99)
Glucose, Bld: 98 mg/dL (ref 70–99)
Potassium: 5.3 mEq/L — ABNORMAL HIGH (ref 3.5–5.1)
Sodium: 135 mEq/L (ref 135–145)
Sodium: 137 mEq/L (ref 135–145)

## 2011-03-25 LAB — CROSSMATCH
ABO/RH(D): B NEG
Antibody Screen: NEGATIVE

## 2011-03-25 LAB — HEPARIN LEVEL (UNFRACTIONATED)
Heparin Unfractionated: 0.15 IU/mL — ABNORMAL LOW (ref 0.30–0.70)
Heparin Unfractionated: 0.28 IU/mL — ABNORMAL LOW (ref 0.30–0.70)
Heparin Unfractionated: 0.3 IU/mL (ref 0.30–0.70)

## 2011-03-25 LAB — HEMOGLOBIN A1C: Mean Plasma Glucose: 71 mg/dL

## 2011-03-25 LAB — CBC
HCT: 23.2 % — ABNORMAL LOW (ref 39.0–52.0)
HCT: 31.8 % — ABNORMAL LOW (ref 39.0–52.0)
Hemoglobin: 10.6 g/dL — ABNORMAL LOW (ref 13.0–17.0)
Hemoglobin: 7.9 g/dL — CL (ref 13.0–17.0)
Hemoglobin: 9.6 g/dL — ABNORMAL LOW (ref 13.0–17.0)
Hemoglobin: 9.7 g/dL — ABNORMAL LOW (ref 13.0–17.0)
MCHC: 33.3 g/dL (ref 30.0–36.0)
MCHC: 33.8 g/dL (ref 30.0–36.0)
MCHC: 34 g/dL (ref 30.0–36.0)
MCV: 91.7 fL (ref 78.0–100.0)
MCV: 91.8 fL (ref 78.0–100.0)
MCV: 93.4 fL (ref 78.0–100.0)
Platelets: 170 10*3/uL (ref 150–400)
RBC: 2.48 MIL/uL — ABNORMAL LOW (ref 4.22–5.81)
RBC: 3.11 MIL/uL — ABNORMAL LOW (ref 4.22–5.81)
RBC: 3.14 MIL/uL — ABNORMAL LOW (ref 4.22–5.81)
RBC: 3.47 MIL/uL — ABNORMAL LOW (ref 4.22–5.81)
RDW: 16.5 % — ABNORMAL HIGH (ref 11.5–15.5)
RDW: 17.9 % — ABNORMAL HIGH (ref 11.5–15.5)
RDW: 18.4 % — ABNORMAL HIGH (ref 11.5–15.5)
WBC: 6.2 10*3/uL (ref 4.0–10.5)

## 2011-03-25 LAB — CK TOTAL AND CKMB (NOT AT ARMC): Relative Index: INVALID (ref 0.0–2.5)

## 2011-03-25 LAB — HEPATIC FUNCTION PANEL
AST: 13 U/L (ref 0–37)
Bilirubin, Direct: <0.1 mg/dL (ref 0.0–0.3)
Total Protein: 5.1 g/dL — ABNORMAL LOW (ref 6.0–8.3)

## 2011-03-25 LAB — CARDIAC PANEL(CRET KIN+CKTOT+MB+TROPI)
CK, MB: 3.6 ng/mL (ref 0.3–4.0)
Total CK: 181 U/L (ref 7–232)
Troponin I: 1.01 ng/mL (ref 0.00–0.06)

## 2011-03-25 LAB — HEPATITIS B SURFACE ANTIGEN: Hepatitis B Surface Ag: NEGATIVE

## 2011-03-27 ENCOUNTER — Emergency Department (HOSPITAL_COMMUNITY): Payer: Medicare Other

## 2011-03-27 ENCOUNTER — Inpatient Hospital Stay (HOSPITAL_COMMUNITY)
Admission: EM | Admit: 2011-03-27 | Discharge: 2011-03-28 | DRG: 291 | Discharge status: Against Medical Advice | Payer: Medicare Other | Attending: Family Medicine | Admitting: Family Medicine

## 2011-03-27 ENCOUNTER — Inpatient Hospital Stay (HOSPITAL_COMMUNITY): Payer: Medicare Other

## 2011-03-27 DIAGNOSIS — Z91199 Patient's noncompliance with other medical treatment and regimen due to unspecified reason: Secondary | ICD-10-CM

## 2011-03-27 DIAGNOSIS — Z9119 Patient's noncompliance with other medical treatment and regimen: Secondary | ICD-10-CM

## 2011-03-27 DIAGNOSIS — N039 Chronic nephritic syndrome with unspecified morphologic changes: Secondary | ICD-10-CM | POA: Diagnosis present

## 2011-03-27 DIAGNOSIS — F259 Schizoaffective disorder, unspecified: Secondary | ICD-10-CM | POA: Diagnosis present

## 2011-03-27 DIAGNOSIS — Z992 Dependence on renal dialysis: Secondary | ICD-10-CM

## 2011-03-27 DIAGNOSIS — D631 Anemia in chronic kidney disease: Secondary | ICD-10-CM | POA: Diagnosis present

## 2011-03-27 DIAGNOSIS — F319 Bipolar disorder, unspecified: Secondary | ICD-10-CM | POA: Diagnosis present

## 2011-03-27 DIAGNOSIS — K219 Gastro-esophageal reflux disease without esophagitis: Secondary | ICD-10-CM | POA: Diagnosis present

## 2011-03-27 DIAGNOSIS — I509 Heart failure, unspecified: Principal | ICD-10-CM | POA: Diagnosis present

## 2011-03-27 DIAGNOSIS — I428 Other cardiomyopathies: Secondary | ICD-10-CM | POA: Diagnosis present

## 2011-03-27 DIAGNOSIS — N186 End stage renal disease: Secondary | ICD-10-CM | POA: Diagnosis present

## 2011-03-27 DIAGNOSIS — I120 Hypertensive chronic kidney disease with stage 5 chronic kidney disease or end stage renal disease: Secondary | ICD-10-CM | POA: Diagnosis present

## 2011-03-27 DIAGNOSIS — F411 Generalized anxiety disorder: Secondary | ICD-10-CM | POA: Diagnosis present

## 2011-03-27 LAB — DIFFERENTIAL
Basophils Absolute: 0 10*3/uL (ref 0.0–0.1)
Basophils Relative: 0 % (ref 0–1)
Lymphocytes Relative: 19 % (ref 12–46)
Monocytes Absolute: 0.3 10*3/uL (ref 0.1–1.0)
Monocytes Relative: 5 % (ref 3–12)
Neutro Abs: 4.1 10*3/uL (ref 1.7–7.7)
Neutrophils Relative %: 73 % (ref 43–77)

## 2011-03-27 LAB — BASIC METABOLIC PANEL
BUN: 22 mg/dL (ref 6–23)
Calcium: 9.4 mg/dL (ref 8.4–10.5)
GFR calc non Af Amer: 9 mL/min — ABNORMAL LOW (ref >60)
Glucose, Bld: 103 mg/dL — ABNORMAL HIGH (ref 70–99)
Potassium: 3.6 mEq/L (ref 3.5–5.1)

## 2011-03-27 LAB — POCT CARDIAC MARKERS
Myoglobin, poc: 402 ng/mL (ref 12–200)
Troponin i, poc: <0.05 ng/mL (ref 0.00–0.09)

## 2011-03-27 LAB — CBC
HCT: 31.3 % — ABNORMAL LOW (ref 39.0–52.0)
Hemoglobin: 10.1 g/dL — ABNORMAL LOW (ref 13.0–17.0)
MCH: 27.6 pg (ref 26.0–34.0)
MCHC: 32.3 g/dL (ref 30.0–36.0)
RBC: 3.66 MIL/uL — ABNORMAL LOW (ref 4.22–5.81)

## 2011-03-28 LAB — DIFFERENTIAL
Basophils Relative: 0 % (ref 0–1)
Lymphs Abs: 1.2 10*3/uL (ref 0.7–4.0)
Monocytes Absolute: 0.4 10*3/uL (ref 0.1–1.0)
Monocytes Relative: 9 % (ref 3–12)
Neutro Abs: 3.3 10*3/uL (ref 1.7–7.7)

## 2011-03-28 LAB — CBC
HCT: 29.9 % — ABNORMAL LOW (ref 39.0–52.0)
Hemoglobin: 9.7 g/dL — ABNORMAL LOW (ref 13.0–17.0)
MCH: 27.2 pg (ref 26.0–34.0)
MCHC: 32.4 g/dL (ref 30.0–36.0)
MCV: 84 fL (ref 78.0–100.0)

## 2011-03-28 LAB — CARDIAC PANEL(CRET KIN+CKTOT+MB+TROPI)
CK, MB: 1.5 ng/mL (ref 0.3–4.0)
Troponin I: 0.06 ng/mL (ref 0.00–0.06)

## 2011-03-28 LAB — BASIC METABOLIC PANEL
CO2: 28 mEq/L (ref 19–32)
Calcium: 9.9 mg/dL (ref 8.4–10.5)
Chloride: 101 mEq/L (ref 96–112)
Glucose, Bld: 86 mg/dL (ref 70–99)
Sodium: 138 mEq/L (ref 135–145)

## 2011-03-29 ENCOUNTER — Emergency Department (HOSPITAL_COMMUNITY)
Admission: EM | Admit: 2011-03-29 | Discharge: 2011-03-29 | Disposition: A | Discharge status: Home / Self Care | Payer: Medicare Other | Attending: Emergency Medicine | Admitting: Emergency Medicine

## 2011-03-29 DIAGNOSIS — Z7902 Long term (current) use of antithrombotics/antiplatelets: Secondary | ICD-10-CM | POA: Insufficient documentation

## 2011-03-29 DIAGNOSIS — M545 Low back pain, unspecified: Secondary | ICD-10-CM | POA: Insufficient documentation

## 2011-03-29 DIAGNOSIS — J449 Chronic obstructive pulmonary disease, unspecified: Secondary | ICD-10-CM | POA: Insufficient documentation

## 2011-03-29 DIAGNOSIS — I2510 Atherosclerotic heart disease of native coronary artery without angina pectoris: Secondary | ICD-10-CM | POA: Insufficient documentation

## 2011-03-29 DIAGNOSIS — F17200 Nicotine dependence, unspecified, uncomplicated: Secondary | ICD-10-CM | POA: Insufficient documentation

## 2011-03-29 DIAGNOSIS — J4489 Other specified chronic obstructive pulmonary disease: Secondary | ICD-10-CM | POA: Insufficient documentation

## 2011-03-29 DIAGNOSIS — N186 End stage renal disease: Secondary | ICD-10-CM | POA: Insufficient documentation

## 2011-03-29 DIAGNOSIS — Z79899 Other long term (current) drug therapy: Secondary | ICD-10-CM | POA: Insufficient documentation

## 2011-03-29 DIAGNOSIS — I120 Hypertensive chronic kidney disease with stage 5 chronic kidney disease or end stage renal disease: Secondary | ICD-10-CM | POA: Insufficient documentation

## 2011-03-29 DIAGNOSIS — D649 Anemia, unspecified: Secondary | ICD-10-CM | POA: Insufficient documentation

## 2011-03-29 DIAGNOSIS — R109 Unspecified abdominal pain: Secondary | ICD-10-CM | POA: Insufficient documentation

## 2011-03-29 LAB — URINE MICROSCOPIC-ADD ON

## 2011-03-29 LAB — URINALYSIS, ROUTINE W REFLEX MICROSCOPIC
Glucose, UA: 100 mg/dL — AB
Leukocytes, UA: NEGATIVE
Nitrite: NEGATIVE
Specific Gravity, Urine: 1.015 (ref 1.005–1.030)
pH: 7 (ref 5.0–8.0)

## 2011-03-29 LAB — RAPID URINE DRUG SCREEN, HOSP PERFORMED
Barbiturates: NOT DETECTED
Benzodiazepines: POSITIVE — AB
Cocaine: NOT DETECTED

## 2011-03-29 LAB — CBC
MCV: 86 fL (ref 78.0–100.0)
Platelets: 211 10*3/uL (ref 150–400)
RBC: 3.64 MIL/uL — ABNORMAL LOW (ref 4.22–5.81)
RDW: 18 % — ABNORMAL HIGH (ref 11.5–15.5)
WBC: 4.7 10*3/uL (ref 4.0–10.5)

## 2011-03-29 LAB — COMPREHENSIVE METABOLIC PANEL
ALT: 10 U/L (ref 0–53)
AST: 13 U/L (ref 0–37)
CO2: 28 mEq/L (ref 19–32)
Calcium: 9.9 mg/dL (ref 8.4–10.5)
Chloride: 96 mEq/L (ref 96–112)
GFR calc Af Amer: 21 mL/min — ABNORMAL LOW (ref >60)
GFR calc non Af Amer: 17 mL/min — ABNORMAL LOW (ref >60)
Glucose, Bld: 109 mg/dL — ABNORMAL HIGH (ref 70–99)
Sodium: 134 mEq/L — ABNORMAL LOW (ref 135–145)
Total Bilirubin: 0.5 mg/dL (ref 0.3–1.2)

## 2011-03-29 LAB — DIFFERENTIAL
Basophils Relative: 0 % (ref 0–1)
Eosinophils Absolute: 0.2 10*3/uL (ref 0.0–0.7)
Eosinophils Relative: 4 % (ref 0–5)
Neutrophils Relative %: 54 % (ref 43–77)

## 2011-03-29 LAB — LIPASE, BLOOD: Lipase: 33 U/L (ref 11–59)

## 2011-03-30 LAB — CBC
MCHC: 34.1 g/dL (ref 30.0–36.0)
Platelets: 302 10*3/uL (ref 150–400)
RDW: 17 % — ABNORMAL HIGH (ref 11.5–15.5)

## 2011-03-30 LAB — BASIC METABOLIC PANEL
BUN: 12 mg/dL (ref 6–23)
CO2: 28 mEq/L (ref 19–32)
Calcium: 9.3 mg/dL (ref 8.4–10.5)
Creatinine, Ser: 4.99 mg/dL — ABNORMAL HIGH (ref 0.4–1.5)
GFR calc non Af Amer: 13 mL/min — ABNORMAL LOW (ref >60)
Glucose, Bld: 111 mg/dL — ABNORMAL HIGH (ref 70–99)

## 2011-03-30 LAB — DIFFERENTIAL
Basophils Absolute: 0 10*3/uL (ref 0.0–0.1)
Basophils Relative: 0 % (ref 0–1)
Lymphocytes Relative: 19 % (ref 12–46)
Monocytes Absolute: 0.5 10*3/uL (ref 0.1–1.0)
Neutro Abs: 3.3 10*3/uL (ref 1.7–7.7)
Neutrophils Relative %: 70 % (ref 43–77)

## 2011-03-31 NOTE — H&P (Signed)
[name], [name]               ACCOUNT NO.:  [id number]  MEDICAL RECORD NO.:  [id number]           PATIENT TYPE:  LOCATION:                                 FACILITY:  PHYSICIAN:  Kingsley Callander. Ouida Sills, MD       DATE OF BIRTH:  [birth year]  DATE OF ADMISSION: DATE OF DISCHARGE:  LH                             HISTORY & PHYSICAL   CHIEF COMPLAINT:  Shortness of breath.  HISTORY OF PRESENT ILLNESS:  This patient is a 35 year old white male with a history of end-stage renal disease and congestive heart failure who presented to the emergency room complaining of increasing shortness of breath.  He was found to have a low oxygen saturation in the 80s.  He is not on oxygen at the time.  He later gave a history of using home oxygen.  The patient was found to be experiencing volume overload.  He had been dialyzed 1 day prior to admission.  Chest x-ray on admission revealed pulmonary edema.  His BNP was elevated.  He continues to make a little urine.  He denies any major weight gain between dialysis sessions.  He had experienced no difficulties with dialysis the day before.  He also has a history of COPD.  He states he continues to smoke.  PAST MEDICAL HISTORY: 1. End-stage renal disease. 2. COPD. 3. Coronary artery disease, status post 3 stents. 4. Depression. 5. GERD. 6. Bipolar disorder. 7. Restless legs syndrome. 8. Hypertension.  MEDICATIONS: 1. Amlodipine 10 mg daily. 2. Aspirin 81 mg daily. 3. Crestor 20 mg daily. 4. Cymbalta 60 mg daily. 5. Hydralazine 50 mg t.i.d. 6. Labetalol 400 mg b.i.d. 7. Lisinopril 20 mg daily. 8. Plavix 75 mg daily. 9. Protonix 40 mg daily. 10.Xanax 0.5 mg t.i.d. 11.Zyprexa unknown dose.  ALLERGIES: 1. FENTANYL. 2. IBUPROFEN. 3. LITHIUM. 4. METHADONE. 5. NAPROXEN. 6. TORADOL. 7. ZOCOR.  FAMILY HISTORY:  His father died of an unknown type of cancer.  His mother is alive, but has diabetes.  SOCIAL HISTORY:  He smokes half-pack per day.  He  denies alcohol or recreational substance use.  He has a history of seeking pain medications.  REVIEW OF SYSTEMS:  He complains of back pain and abdominal pain.  He denies any nausea, vomiting, or diarrhea.  He denies any injury to his back.  He denies any fever.  He was last hospitalized in March.  PHYSICAL EXAMINATION:  VITAL SIGNS:  Temperature 97.8, blood pressure 192/114, pulse 101, respirations 26, and oxygen saturation 84% initially, been saturations in the 90s on oxygen. GENERAL:  Alert and in no acute distress. HEENT:  Eyes, nose, and pharynx unremarkable. NECK:  No JVD or thyromegaly. LUNGS:  Bilateral rales. HEART:  Regular with no murmurs. ABDOMEN:  Soft, nondistended, and nontender with no hepatosplenomegaly. GU:  No CVA tenderness. EXTREMITIES:  He has trace edema in the lower legs.  No cyanosis or clubbing. NEURO:  No focal weakness. LYMPH NODES:  No cervical or supraclavicular enlargement. SKIN:  Warm and dry.  LABORATORY DATA:  White count 5.6, hemoglobin 10.1, and platelets 199,000.  Troponin I less than 0.05.  BNP greater than 3200.  Sodium 136, potassium 3.6, bicarb 23, glucose 103, BUN 22, creatinine 6.91, and calcium 9.4.  Chest x-ray reveals bilateral pulmonary infiltrates, right greater than left, suspicious for pulmonary edema.  IMPRESSION/PLAN: 1. End-stage renal disease, now with congestive heart failure     findings.  He will require admission and nephrology consultation.     This has been discussed with Dr. Kristian Covey who will plan his     dialysis.  Continue supplemental oxygen. 2. Chronic obstructive pulmonary disease.  Treat with inhaled     bronchodilators. 3. Normocytic anemia.  Hemoglobin is 10.1 with an MCV of 85.  He     receives Procrit with dialysis. 4. Hypertension.  Medication compliance is suspect.  We will     reinitiate his medication regimen. 5. Coronary artery disease, status post stents. 6. Bipolar disorder and anxiety.  Continue  Zyprexa and Xanax. 7. Gastroesophageal reflux disease.  Continue Protonix.     Kingsley Callander. Ouida Sills, MD     ROF/MEDQ  D:  03/28/2011  T:  03/28/2011  Job:  [id number]  Electronically Signed by Carylon Perches MD on 03/31/2011 07:48:40 AM

## 2011-04-04 LAB — COMPREHENSIVE METABOLIC PANEL
AST: 19 U/L (ref 0–37)
Albumin: 3.7 g/dL (ref 3.5–5.2)
Alkaline Phosphatase: 116 U/L (ref 39–117)
BUN: 29 mg/dL — ABNORMAL HIGH (ref 6–23)
GFR calc Af Amer: 8 mL/min — ABNORMAL LOW (ref >60)
Potassium: 3.6 mEq/L (ref 3.5–5.1)
Total Protein: 6.4 g/dL (ref 6.0–8.3)

## 2011-04-04 LAB — CARDIAC PANEL(CRET KIN+CKTOT+MB+TROPI)
CK, MB: 10.5 ng/mL — ABNORMAL HIGH (ref 0.3–4.0)
Total CK: 230 U/L (ref 7–232)
Troponin I: 2.4 ng/mL (ref 0.00–0.06)

## 2011-04-04 LAB — CROSSMATCH: ABO/RH(D): B NEG

## 2011-04-04 LAB — PROTIME-INR
INR: 1 (ref 0.00–1.49)
Prothrombin Time: 12.8 seconds (ref 11.6–15.2)

## 2011-04-04 LAB — HEPARIN LEVEL (UNFRACTIONATED)
Heparin Unfractionated: 0.22 IU/mL — ABNORMAL LOW (ref 0.30–0.70)
Heparin Unfractionated: 0.26 IU/mL — ABNORMAL LOW (ref 0.30–0.70)
Heparin Unfractionated: 0.33 IU/mL (ref 0.30–0.70)

## 2011-04-04 LAB — CK TOTAL AND CKMB (NOT AT ARMC)
CK, MB: 13.2 ng/mL — ABNORMAL HIGH (ref 0.3–4.0)
Relative Index: 4.2 — ABNORMAL HIGH (ref 0.0–2.5)

## 2011-04-04 LAB — DIFFERENTIAL
Basophils Relative: 0 % (ref 0–1)
Eosinophils Relative: 2 % (ref 0–5)
Lymphocytes Relative: 27 % (ref 12–46)
Monocytes Absolute: 0.5 10*3/uL (ref 0.1–1.0)
Monocytes Relative: 9 % (ref 3–12)
Neutro Abs: 3.4 10*3/uL (ref 1.7–7.7)

## 2011-04-04 LAB — RENAL FUNCTION PANEL
Albumin: 3 g/dL — ABNORMAL LOW (ref 3.5–5.2)
CO2: 27 mEq/L (ref 19–32)
Calcium: 8.6 mg/dL (ref 8.4–10.5)
Creatinine, Ser: 5.56 mg/dL — ABNORMAL HIGH (ref 0.4–1.5)
GFR calc Af Amer: 14 mL/min — ABNORMAL LOW (ref >60)
GFR calc non Af Amer: 12 mL/min — ABNORMAL LOW (ref >60)
Phosphorus: 5.3 mg/dL — ABNORMAL HIGH (ref 2.3–4.6)
Sodium: 127 mEq/L — ABNORMAL LOW (ref 135–145)

## 2011-04-04 LAB — BASIC METABOLIC PANEL
BUN: 25 mg/dL — ABNORMAL HIGH (ref 6–23)
Calcium: 8.5 mg/dL (ref 8.4–10.5)
Calcium: 8.9 mg/dL (ref 8.4–10.5)
Chloride: 91 mEq/L — ABNORMAL LOW (ref 96–112)
Creatinine, Ser: 7.58 mg/dL — ABNORMAL HIGH (ref 0.4–1.5)
Creatinine, Ser: 9.83 mg/dL — ABNORMAL HIGH (ref 0.4–1.5)
GFR calc Af Amer: 10 mL/min — ABNORMAL LOW (ref >60)
GFR calc Af Amer: 7 mL/min — ABNORMAL LOW (ref >60)
GFR calc non Af Amer: 6 mL/min — ABNORMAL LOW (ref >60)
Sodium: 121 mEq/L — ABNORMAL LOW (ref 135–145)

## 2011-04-04 LAB — CBC
Hemoglobin: 8.3 g/dL — ABNORMAL LOW (ref 13.0–17.0)
Hemoglobin: 9.1 g/dL — ABNORMAL LOW (ref 13.0–17.0)
MCHC: 34.2 g/dL (ref 30.0–36.0)
MCHC: 34.3 g/dL (ref 30.0–36.0)
MCV: 89.5 fL (ref 78.0–100.0)
Platelets: 155 10*3/uL (ref 150–400)
Platelets: 161 10*3/uL (ref 150–400)
RBC: 2.69 MIL/uL — ABNORMAL LOW (ref 4.22–5.81)
RBC: 3 MIL/uL — ABNORMAL LOW (ref 4.22–5.81)
RDW: 14 % (ref 11.5–15.5)
RDW: 14.2 % (ref 11.5–15.5)
RDW: 14.6 % (ref 11.5–15.5)
WBC: 4.2 10*3/uL (ref 4.0–10.5)
WBC: 5.4 10*3/uL (ref 4.0–10.5)

## 2011-04-04 LAB — LIPID PANEL
Cholesterol: 110 mg/dL (ref 0–200)
LDL Cholesterol: 62 mg/dL (ref 0–99)
Total CHOL/HDL Ratio: 2.9 RATIO

## 2011-04-04 LAB — D-DIMER, QUANTITATIVE: D-Dimer, Quant: 0.24 ug/mL-FEU (ref 0.00–0.48)

## 2011-04-04 LAB — PHOSPHORUS: Phosphorus: 7.7 mg/dL — ABNORMAL HIGH (ref 2.3–4.6)

## 2011-04-04 LAB — ABO/RH: ABO/RH(D): B NEG

## 2011-04-04 LAB — BRAIN NATRIURETIC PEPTIDE: Pro B Natriuretic peptide (BNP): 402 pg/mL — ABNORMAL HIGH (ref 0.0–100.0)

## 2011-04-04 LAB — APTT: aPTT: 32 seconds (ref 24–37)

## 2011-04-04 LAB — HEMOCCULT GUIAC POC 1CARD (OFFICE): Fecal Occult Bld: NEGATIVE

## 2011-04-04 LAB — TROPONIN I: Troponin I: 1.71 ng/mL (ref 0.00–0.06)

## 2011-04-05 LAB — RENAL FUNCTION PANEL
Albumin: 2.4 g/dL — ABNORMAL LOW (ref 3.5–5.2)
Albumin: 3.6 g/dL (ref 3.5–5.2)
Albumin: 3.8 g/dL (ref 3.5–5.2)
BUN: 42 mg/dL — ABNORMAL HIGH (ref 6–23)
BUN: 44 mg/dL — ABNORMAL HIGH (ref 6–23)
CO2: 27 mEq/L (ref 19–32)
CO2: 28 mEq/L (ref 19–32)
CO2: 21 mEq/L (ref 19–32)
CO2: 22 mEq/L (ref 19–32)
CO2: 26 mEq/L (ref 19–32)
CO2: 27 mEq/L (ref 19–32)
Calcium: 8.5 mg/dL (ref 8.4–10.5)
Calcium: 9.2 mg/dL (ref 8.4–10.5)
Calcium: 9.2 mg/dL (ref 8.4–10.5)
Calcium: 9.3 mg/dL (ref 8.4–10.5)
Calcium: 9.8 mg/dL (ref 8.4–10.5)
Chloride: 93 mEq/L — ABNORMAL LOW (ref 96–112)
Chloride: 102 mEq/L (ref 96–112)
Chloride: 104 mEq/L (ref 96–112)
Chloride: 94 mEq/L — ABNORMAL LOW (ref 96–112)
Chloride: 98 mEq/L (ref 96–112)
Creatinine, Ser: 10.79 mg/dL — ABNORMAL HIGH (ref 0.4–1.5)
Creatinine, Ser: 8.16 mg/dL — ABNORMAL HIGH (ref 0.4–1.5)
Creatinine, Ser: 8.62 mg/dL — ABNORMAL HIGH (ref 0.4–1.5)
Creatinine, Ser: 9.13 mg/dL — ABNORMAL HIGH (ref 0.4–1.5)
GFR calc Af Amer: 14 mL/min — ABNORMAL LOW (ref >60)
GFR calc Af Amer: 13 mL/min — ABNORMAL LOW (ref >60)
GFR calc Af Amer: 7 mL/min — ABNORMAL LOW (ref >60)
GFR calc Af Amer: 8 mL/min — ABNORMAL LOW (ref >60)
GFR calc Af Amer: 9 mL/min — ABNORMAL LOW (ref >60)
GFR calc non Af Amer: 12 mL/min — ABNORMAL LOW (ref >60)
GFR calc non Af Amer: 13 mL/min — ABNORMAL LOW (ref >60)
GFR calc non Af Amer: 11 mL/min — ABNORMAL LOW (ref >60)
GFR calc non Af Amer: 5 mL/min — ABNORMAL LOW (ref >60)
GFR calc non Af Amer: 7 mL/min — ABNORMAL LOW (ref >60)
Glucose, Bld: 96 mg/dL (ref 70–99)
Glucose, Bld: 112 mg/dL — ABNORMAL HIGH (ref 70–99)
Glucose, Bld: 81 mg/dL (ref 70–99)
Glucose, Bld: 88 mg/dL (ref 70–99)
Glucose, Bld: 89 mg/dL (ref 70–99)
Phosphorus: 5.3 mg/dL — ABNORMAL HIGH (ref 2.3–4.6)
Phosphorus: 6.7 mg/dL — ABNORMAL HIGH (ref 2.3–4.6)
Potassium: 4.1 mEq/L (ref 3.5–5.1)
Potassium: 3.7 mEq/L (ref 3.5–5.1)
Potassium: 4.4 mEq/L (ref 3.5–5.1)
Potassium: 4.6 mEq/L (ref 3.5–5.1)
Sodium: 132 mEq/L — ABNORMAL LOW (ref 135–145)
Sodium: 134 mEq/L — ABNORMAL LOW (ref 135–145)
Sodium: 138 mEq/L (ref 135–145)
Sodium: 140 mEq/L (ref 135–145)
Sodium: 142 mEq/L (ref 135–145)

## 2011-04-05 LAB — BASIC METABOLIC PANEL
BUN: 14 mg/dL (ref 6–23)
BUN: 32 mg/dL — ABNORMAL HIGH (ref 6–23)
CO2: 19 mEq/L (ref 19–32)
Calcium: 9.6 mg/dL (ref 8.4–10.5)
Calcium: 8.9 mg/dL (ref 8.4–10.5)
Calcium: 9.3 mg/dL (ref 8.4–10.5)
Chloride: 97 mEq/L (ref 96–112)
Chloride: 99 mEq/L (ref 96–112)
Creatinine, Ser: 9.59 mg/dL — ABNORMAL HIGH (ref 0.4–1.5)
GFR calc Af Amer: 8 mL/min — ABNORMAL LOW (ref >60)
GFR calc Af Amer: 9 mL/min — ABNORMAL LOW (ref >60)
GFR calc non Af Amer: 15 mL/min — ABNORMAL LOW (ref >60)
GFR calc non Af Amer: 7 mL/min — ABNORMAL LOW (ref >60)
GFR calc non Af Amer: 7 mL/min — ABNORMAL LOW (ref >60)
Glucose, Bld: 99 mg/dL (ref 70–99)
Potassium: 3.5 mEq/L (ref 3.5–5.1)
Potassium: 4.7 mEq/L (ref 3.5–5.1)
Sodium: 137 mEq/L (ref 135–145)
Sodium: 131 mEq/L — ABNORMAL LOW (ref 135–145)
Sodium: 135 mEq/L (ref 135–145)
Sodium: 140 mEq/L (ref 135–145)

## 2011-04-05 LAB — DRUG SCREEN PANEL (SERUM)
Barbiturate Scrn: NEGATIVE
Cocaine (Metabolite): NEGATIVE
Methadone (Dolophine), Serum: NEGATIVE

## 2011-04-05 LAB — CARDIAC PANEL(CRET KIN+CKTOT+MB+TROPI)
CK, MB: 1 ng/mL (ref 0.3–4.0)
Relative Index: INVALID (ref 0.0–2.5)
Total CK: 37 U/L (ref 7–232)
Total CK: 51 U/L (ref 7–232)
Troponin I: 0.03 ng/mL (ref 0.00–0.06)
Troponin I: 0.05 ng/mL (ref 0.00–0.06)

## 2011-04-05 LAB — CBC
HCT: 29.2 % — ABNORMAL LOW (ref 39.0–52.0)
HCT: 31.1 % — ABNORMAL LOW (ref 39.0–52.0)
HCT: 31.6 % — ABNORMAL LOW (ref 39.0–52.0)
HCT: 31.8 % — ABNORMAL LOW (ref 39.0–52.0)
HCT: 33 % — ABNORMAL LOW (ref 39.0–52.0)
HCT: 33.5 % — ABNORMAL LOW (ref 39.0–52.0)
HCT: 37.1 % — ABNORMAL LOW (ref 39.0–52.0)
Hemoglobin: 10.9 g/dL — ABNORMAL LOW (ref 13.0–17.0)
Hemoglobin: 10.4 g/dL — ABNORMAL LOW (ref 13.0–17.0)
Hemoglobin: 10.8 g/dL — ABNORMAL LOW (ref 13.0–17.0)
Hemoglobin: 10.9 g/dL — ABNORMAL LOW (ref 13.0–17.0)
Hemoglobin: 11.4 g/dL — ABNORMAL LOW (ref 13.0–17.0)
Hemoglobin: 11.4 g/dL — ABNORMAL LOW (ref 13.0–17.0)
Hemoglobin: 12.7 g/dL — ABNORMAL LOW (ref 13.0–17.0)
MCHC: 34 g/dL (ref 30.0–36.0)
MCHC: 34.1 g/dL (ref 30.0–36.0)
MCHC: 34.6 g/dL (ref 30.0–36.0)
MCHC: 34.6 g/dL (ref 30.0–36.0)
MCHC: 34.7 g/dL (ref 30.0–36.0)
MCV: 91.2 fL (ref 78.0–100.0)
MCV: 90.8 fL (ref 78.0–100.0)
MCV: 90.9 fL (ref 78.0–100.0)
MCV: 92.2 fL (ref 78.0–100.0)
MCV: 94.1 fL (ref 78.0–100.0)
Platelets: 348 10*3/uL (ref 150–400)
Platelets: 193 10*3/uL (ref 150–400)
Platelets: 230 10*3/uL (ref 150–400)
Platelets: 279 10*3/uL (ref 150–400)
Platelets: 292 10*3/uL (ref 150–400)
Platelets: 325 10*3/uL (ref 150–400)
RBC: 3.54 MIL/uL — ABNORMAL LOW (ref 4.22–5.81)
RBC: 2.95 MIL/uL — ABNORMAL LOW (ref 4.22–5.81)
RBC: 3.24 MIL/uL — ABNORMAL LOW (ref 4.22–5.81)
RBC: 3.37 MIL/uL — ABNORMAL LOW (ref 4.22–5.81)
RBC: 3.43 MIL/uL — ABNORMAL LOW (ref 4.22–5.81)
RBC: 3.56 MIL/uL — ABNORMAL LOW (ref 4.22–5.81)
RDW: 15.4 % (ref 11.5–15.5)
RDW: 18.1 % — ABNORMAL HIGH (ref 11.5–15.5)
RDW: 18.7 % — ABNORMAL HIGH (ref 11.5–15.5)
RDW: 18.7 % — ABNORMAL HIGH (ref 11.5–15.5)
RDW: 19.2 % — ABNORMAL HIGH (ref 11.5–15.5)
WBC: 14.9 10*3/uL — ABNORMAL HIGH (ref 4.0–10.5)
WBC: 6.3 10*3/uL (ref 4.0–10.5)
WBC: 6.6 10*3/uL (ref 4.0–10.5)
WBC: 5.2 10*3/uL (ref 4.0–10.5)
WBC: 5.3 10*3/uL (ref 4.0–10.5)
WBC: 5.7 10*3/uL (ref 4.0–10.5)
WBC: 6.9 10*3/uL (ref 4.0–10.5)
WBC: 7.4 10*3/uL (ref 4.0–10.5)

## 2011-04-05 LAB — DIFFERENTIAL
Basophils Absolute: 0 10*3/uL (ref 0.0–0.1)
Basophils Absolute: 0 10*3/uL (ref 0.0–0.1)
Basophils Relative: 0 % (ref 0–1)
Basophils Relative: 0 % (ref 0–1)
Eosinophils Absolute: 0.1 10*3/uL (ref 0.0–0.7)
Eosinophils Absolute: 0.2 10*3/uL (ref 0.0–0.7)
Eosinophils Relative: 1 % (ref 0–5)
Eosinophils Relative: 4 % (ref 0–5)
Lymphocytes Relative: 17 % (ref 12–46)
Lymphs Abs: 1.2 10*3/uL (ref 0.7–4.0)
Lymphs Abs: 1 10*3/uL (ref 0.7–4.0)
Lymphs Abs: 1.2 10*3/uL (ref 0.7–4.0)
Monocytes Absolute: 0.2 10*3/uL (ref 0.1–1.0)
Monocytes Relative: 6 % (ref 3–12)
Monocytes Relative: 8 % (ref 3–12)
Neutro Abs: 12.6 10*3/uL — ABNORMAL HIGH (ref 1.7–7.7)
Neutro Abs: 4.6 10*3/uL (ref 1.7–7.7)
Neutrophils Relative %: 85 % — ABNORMAL HIGH (ref 43–77)
Neutrophils Relative %: 73 % (ref 43–77)
Neutrophils Relative %: 61 % (ref 43–77)

## 2011-04-05 LAB — COMPREHENSIVE METABOLIC PANEL
Albumin: 2.7 g/dL — ABNORMAL LOW (ref 3.5–5.2)
Alkaline Phosphatase: 99 U/L (ref 39–117)
BUN: 27 mg/dL — ABNORMAL HIGH (ref 6–23)
Chloride: 99 mEq/L (ref 96–112)
Glucose, Bld: 96 mg/dL (ref 70–99)
Potassium: 3.9 mEq/L (ref 3.5–5.1)
Total Bilirubin: 0.6 mg/dL (ref 0.3–1.2)

## 2011-04-05 LAB — URINE DRUGS OF ABUSE SCREEN W ALC, ROUTINE (REF LAB)
Barbiturate Quant, Ur: NEGATIVE
Benzodiazepines.: NEGATIVE
Ethyl Alcohol: <10 mg/dL (ref <10)
Marijuana Metabolite: NEGATIVE

## 2011-04-05 LAB — HEPATIC FUNCTION PANEL
ALT: 15 U/L (ref 0–53)
AST: 17 U/L (ref 0–37)
Albumin: 3.2 g/dL — ABNORMAL LOW (ref 3.5–5.2)

## 2011-04-05 LAB — GLUCOSE, CAPILLARY
Glucose-Capillary: 82 mg/dL (ref 70–99)
Glucose-Capillary: 94 mg/dL (ref 70–99)
Glucose-Capillary: 102 mg/dL — ABNORMAL HIGH (ref 70–99)
Glucose-Capillary: 107 mg/dL — ABNORMAL HIGH (ref 70–99)
Glucose-Capillary: 110 mg/dL — ABNORMAL HIGH (ref 70–99)
Glucose-Capillary: 119 mg/dL — ABNORMAL HIGH (ref 70–99)
Glucose-Capillary: 136 mg/dL — ABNORMAL HIGH (ref 70–99)
Glucose-Capillary: 77 mg/dL (ref 70–99)
Glucose-Capillary: 87 mg/dL (ref 70–99)
Glucose-Capillary: 89 mg/dL (ref 70–99)
Glucose-Capillary: 91 mg/dL (ref 70–99)
Glucose-Capillary: 94 mg/dL (ref 70–99)
Glucose-Capillary: 98 mg/dL (ref 70–99)

## 2011-04-05 LAB — MAGNESIUM: Magnesium: 2.9 mg/dL — ABNORMAL HIGH (ref 1.5–2.5)

## 2011-04-05 LAB — POCT CARDIAC MARKERS
CKMB, poc: 1.5 ng/mL (ref 1.0–8.0)
Myoglobin, poc: 474 ng/mL (ref 12–200)
Myoglobin, poc: 229 ng/mL (ref 12–200)
Troponin i, poc: <0.05 ng/mL (ref 0.00–0.09)

## 2011-04-05 LAB — CULTURE, BLOOD (ROUTINE X 2): Culture: NO GROWTH

## 2011-04-05 LAB — HEPATITIS B SURFACE ANTIGEN: Hepatitis B Surface Ag: NEGATIVE

## 2011-04-05 LAB — TYPE AND SCREEN: Antibody Screen: NEGATIVE

## 2011-04-05 LAB — FOLATE: Folate: 5.8 ng/mL

## 2011-04-05 LAB — RETICULOCYTES
RBC.: 3.57 MIL/uL — ABNORMAL LOW (ref 4.22–5.81)
Retic Count, Absolute: 78.5 10*3/uL (ref 19.0–186.0)
Retic Ct Pct: 2.2 % (ref 0.4–3.1)

## 2011-04-05 LAB — RAPID URINE DRUG SCREEN, HOSP PERFORMED
Amphetamines: NOT DETECTED
Benzodiazepines: NOT DETECTED
Tetrahydrocannabinol: NOT DETECTED

## 2011-04-05 LAB — TSH: TSH: 1.191 u[IU]/mL (ref 0.350–4.500)

## 2011-04-05 LAB — FERRITIN: Ferritin: 692 ng/mL — ABNORMAL HIGH (ref 22–322)

## 2011-04-05 LAB — PROTIME-INR
INR: 1.2 (ref 0.00–1.49)
Prothrombin Time: 13.4 seconds (ref 11.6–15.2)

## 2011-04-05 LAB — FOLATE RBC: RBC Folate: 655 ng/mL — ABNORMAL HIGH (ref 180–600)

## 2011-04-05 LAB — RPR: RPR Ser Ql: NONREACTIVE

## 2011-04-06 IMAGING — CR DG CHEST 1V PORT
1 series · 1 of 1 positions shown · non-contrast
Comparison: 09/06/2009

CLINICAL DATA: Respiratory distress.  Chest pain.

PORTABLE CHEST - 1 VIEW

[AP]
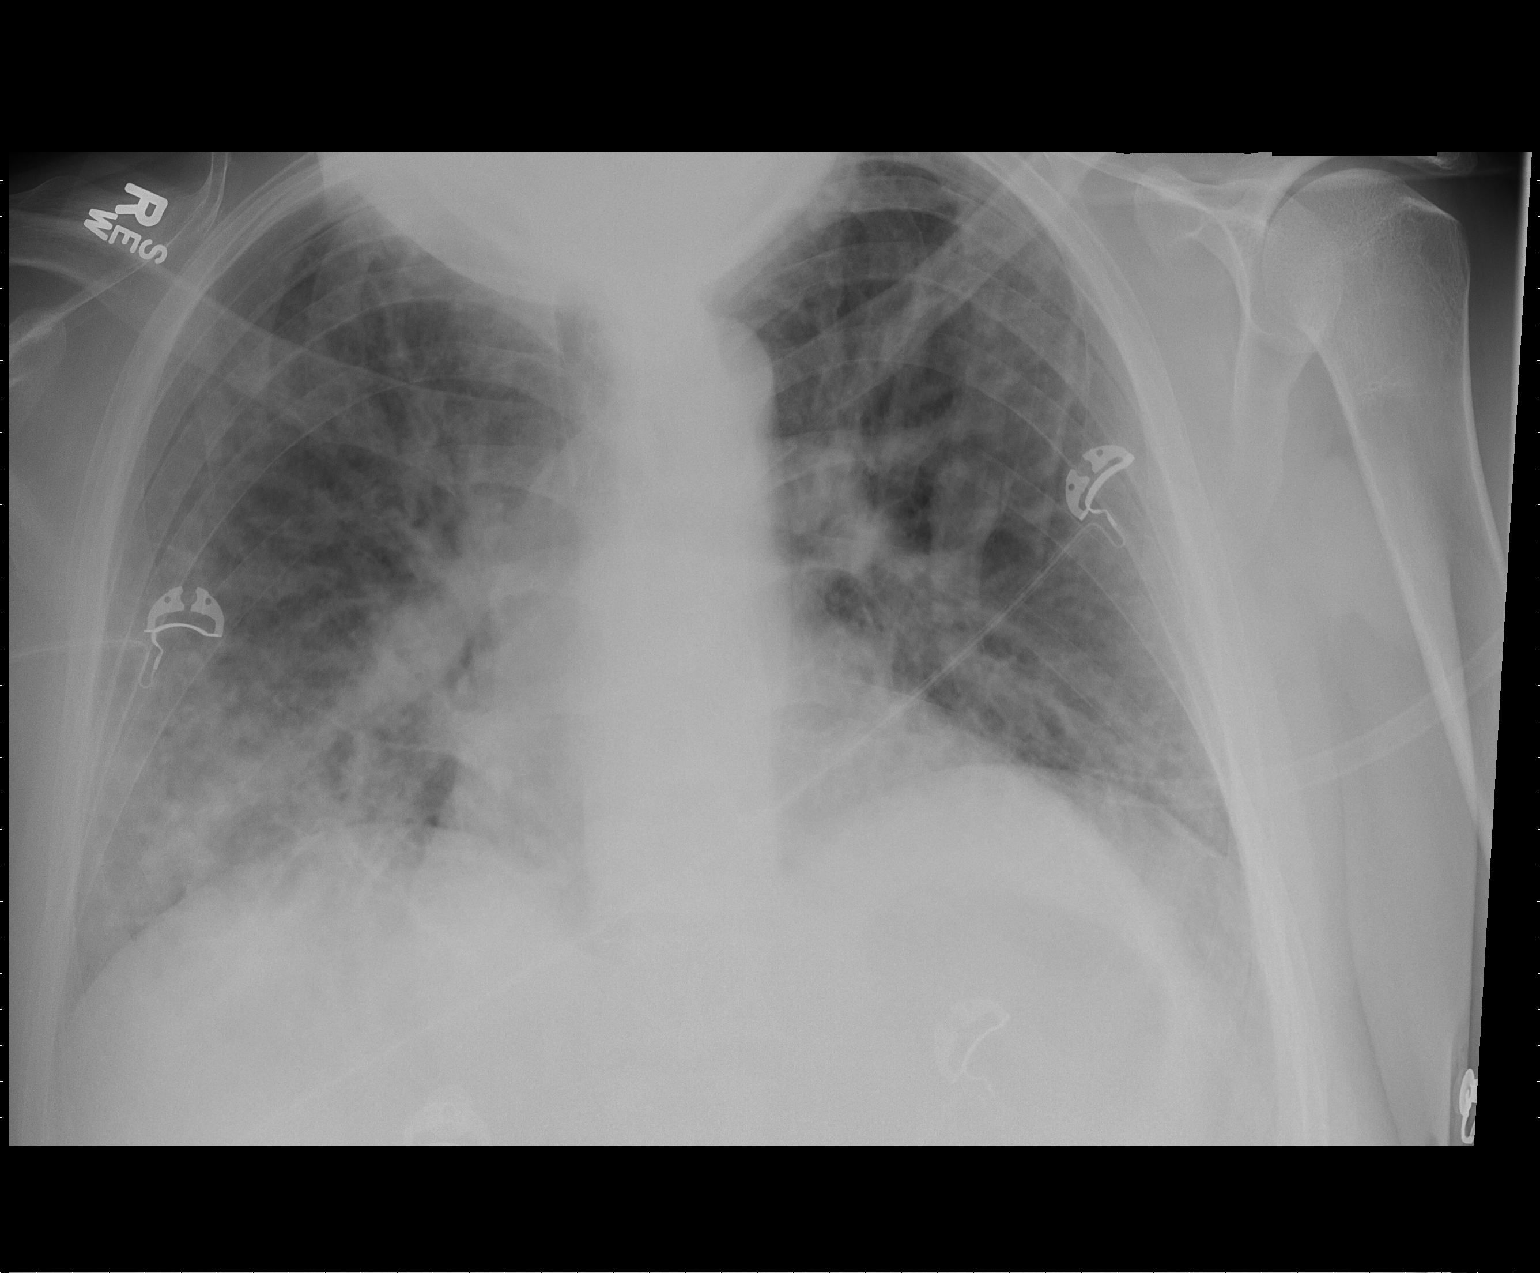

[1 of 1 positions shown; findings below may reference images not displayed]

FINDINGS: Cardiomegaly is noted with moderate pulmonary edema.
This a low-volume film with bibasilar atelectasis.
There is no evidence of pleural effusion or pneumothorax.
No acute bony abnormalities are identified.
IMPRESSION: Cardiomegaly with moderate pulmonary edema and mild bibasilar
atelectasis.

## 2011-04-08 IMAGING — CR DG CHEST 1V PORT
1 series · 1 of 1 positions shown · non-contrast
Comparison: Chest 09/21/2009.

CLINICAL DATA: Volume overload.  Congestive failure.  Status post
dialysis.

PORTABLE CHEST - 1 VIEW

[AP]
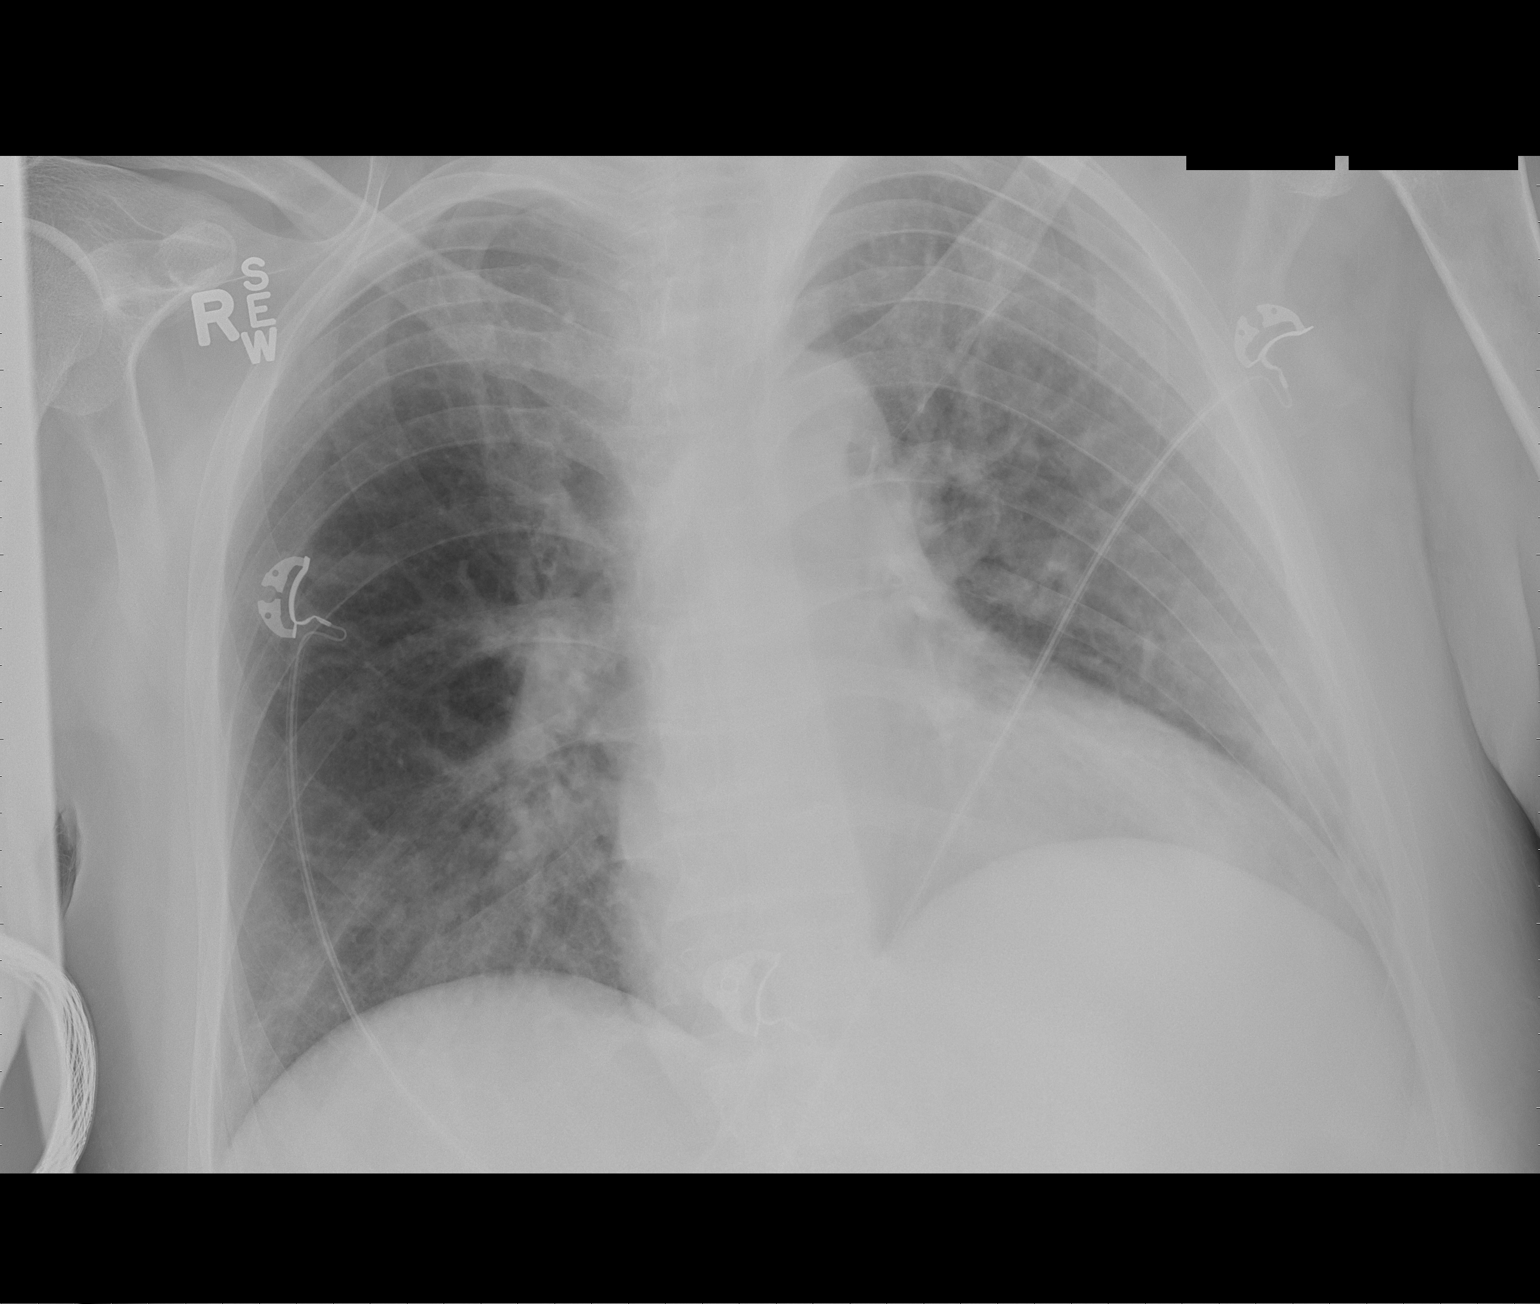

[1 of 1 positions shown; findings below may reference images not displayed]

FINDINGS: The appearance of the chest is improved with decreased
airspace disease compatible with decreased pulmonary edema.
Cardiomegaly.
IMPRESSION: Marked improvement pulmonary edema.

## 2011-04-14 ENCOUNTER — Emergency Department (HOSPITAL_COMMUNITY)
Admission: EM | Admit: 2011-04-14 | Discharge: 2011-04-14 | Disposition: A | Discharge status: Home / Self Care | Payer: Medicare Other | Attending: Emergency Medicine | Admitting: Emergency Medicine

## 2011-04-14 DIAGNOSIS — I120 Hypertensive chronic kidney disease with stage 5 chronic kidney disease or end stage renal disease: Secondary | ICD-10-CM | POA: Insufficient documentation

## 2011-04-14 DIAGNOSIS — N186 End stage renal disease: Secondary | ICD-10-CM | POA: Insufficient documentation

## 2011-04-14 DIAGNOSIS — Z79899 Other long term (current) drug therapy: Secondary | ICD-10-CM | POA: Insufficient documentation

## 2011-04-14 DIAGNOSIS — J449 Chronic obstructive pulmonary disease, unspecified: Secondary | ICD-10-CM | POA: Insufficient documentation

## 2011-04-14 DIAGNOSIS — J4489 Other specified chronic obstructive pulmonary disease: Secondary | ICD-10-CM | POA: Insufficient documentation

## 2011-04-14 DIAGNOSIS — I2510 Atherosclerotic heart disease of native coronary artery without angina pectoris: Secondary | ICD-10-CM | POA: Insufficient documentation

## 2011-04-14 DIAGNOSIS — R109 Unspecified abdominal pain: Secondary | ICD-10-CM | POA: Insufficient documentation

## 2011-04-14 DIAGNOSIS — G8929 Other chronic pain: Secondary | ICD-10-CM | POA: Insufficient documentation

## 2011-04-14 LAB — COMPREHENSIVE METABOLIC PANEL
ALT: 11 U/L (ref 0–53)
Alkaline Phosphatase: 84 U/L (ref 39–117)
BUN: 18 mg/dL (ref 6–23)
CO2: 24 mEq/L (ref 19–32)
Calcium: 9.9 mg/dL (ref 8.4–10.5)
GFR calc non Af Amer: 13 mL/min — ABNORMAL LOW (ref >60)
Glucose, Bld: 80 mg/dL (ref 70–99)
Potassium: 4 mEq/L (ref 3.5–5.1)
Sodium: 125 mEq/L — ABNORMAL LOW (ref 135–145)

## 2011-04-14 LAB — DIFFERENTIAL
Basophils Absolute: 0 10*3/uL (ref 0.0–0.1)
Lymphocytes Relative: 30 % (ref 12–46)
Lymphs Abs: 1.6 10*3/uL (ref 0.7–4.0)
Monocytes Absolute: 0.5 10*3/uL (ref 0.1–1.0)
Neutro Abs: 2.9 10*3/uL (ref 1.7–7.7)

## 2011-04-14 LAB — CBC
HCT: 34.9 % — ABNORMAL LOW (ref 39.0–52.0)
Hemoglobin: 11.2 g/dL — ABNORMAL LOW (ref 13.0–17.0)
MCHC: 32.1 g/dL (ref 30.0–36.0)
MCV: 85.1 fL (ref 78.0–100.0)

## 2011-04-14 LAB — LIPASE, BLOOD: Lipase: 93 U/L — ABNORMAL HIGH (ref 11–59)

## 2011-04-16 ENCOUNTER — Emergency Department (HOSPITAL_COMMUNITY)
Admission: EM | Admit: 2011-04-16 | Discharge: 2011-04-16 | Discharge status: Against Medical Advice | Payer: Medicare Other | Attending: Emergency Medicine | Admitting: Emergency Medicine

## 2011-04-16 DIAGNOSIS — Z79899 Other long term (current) drug therapy: Secondary | ICD-10-CM | POA: Insufficient documentation

## 2011-04-16 DIAGNOSIS — Z7902 Long term (current) use of antithrombotics/antiplatelets: Secondary | ICD-10-CM | POA: Insufficient documentation

## 2011-04-16 DIAGNOSIS — J449 Chronic obstructive pulmonary disease, unspecified: Secondary | ICD-10-CM | POA: Insufficient documentation

## 2011-04-16 DIAGNOSIS — I509 Heart failure, unspecified: Secondary | ICD-10-CM | POA: Insufficient documentation

## 2011-04-16 DIAGNOSIS — J4489 Other specified chronic obstructive pulmonary disease: Secondary | ICD-10-CM | POA: Insufficient documentation

## 2011-04-16 DIAGNOSIS — F3289 Other specified depressive episodes: Secondary | ICD-10-CM | POA: Insufficient documentation

## 2011-04-16 DIAGNOSIS — I2510 Atherosclerotic heart disease of native coronary artery without angina pectoris: Secondary | ICD-10-CM | POA: Insufficient documentation

## 2011-04-16 DIAGNOSIS — Z8659 Personal history of other mental and behavioral disorders: Secondary | ICD-10-CM | POA: Insufficient documentation

## 2011-04-16 DIAGNOSIS — I120 Hypertensive chronic kidney disease with stage 5 chronic kidney disease or end stage renal disease: Secondary | ICD-10-CM | POA: Insufficient documentation

## 2011-04-16 DIAGNOSIS — G8929 Other chronic pain: Secondary | ICD-10-CM | POA: Insufficient documentation

## 2011-04-16 DIAGNOSIS — F329 Major depressive disorder, single episode, unspecified: Secondary | ICD-10-CM | POA: Insufficient documentation

## 2011-04-16 DIAGNOSIS — K219 Gastro-esophageal reflux disease without esophagitis: Secondary | ICD-10-CM | POA: Insufficient documentation

## 2011-04-16 DIAGNOSIS — R109 Unspecified abdominal pain: Secondary | ICD-10-CM | POA: Insufficient documentation

## 2011-04-16 DIAGNOSIS — N186 End stage renal disease: Secondary | ICD-10-CM | POA: Insufficient documentation

## 2011-04-16 LAB — COMPREHENSIVE METABOLIC PANEL
ALT: 13 U/L (ref 0–53)
AST: 17 U/L (ref 0–37)
Alkaline Phosphatase: 95 U/L (ref 39–117)
CO2: 24 mEq/L (ref 19–32)
Chloride: 81 mEq/L — ABNORMAL LOW (ref 96–112)
Creatinine, Ser: 5.92 mg/dL — ABNORMAL HIGH (ref 0.4–1.5)
GFR calc Af Amer: 13 mL/min — ABNORMAL LOW (ref >60)
GFR calc non Af Amer: 11 mL/min — ABNORMAL LOW (ref >60)
Potassium: 5.1 mEq/L (ref 3.5–5.1)
Sodium: 116 mEq/L — CL (ref 135–145)
Total Bilirubin: 0.5 mg/dL (ref 0.3–1.2)

## 2011-04-16 LAB — CBC
Hemoglobin: 11 g/dL — ABNORMAL LOW (ref 13.0–17.0)
MCH: 27.8 pg (ref 26.0–34.0)
RBC: 3.95 MIL/uL — ABNORMAL LOW (ref 4.22–5.81)
WBC: 5.1 10*3/uL (ref 4.0–10.5)

## 2011-04-16 LAB — DIFFERENTIAL
Basophils Relative: 0 % (ref 0–1)
Monocytes Relative: 11 % (ref 3–12)
Neutro Abs: 3 10*3/uL (ref 1.7–7.7)
Neutrophils Relative %: 58 % (ref 43–77)

## 2011-04-19 NOTE — H&P (Signed)
NAMEELMON, [name] NO.:  [id number]  MEDICAL RECORD NO.:  [id number]           PATIENT TYPE:  LOCATION:                                 FACILITY:  PHYSICIAN:  Melvyn Novas, MDDATE OF BIRTH:  1976/04/17  DATE OF ADMISSION: DATE OF DISCHARGE:  LH                             HISTORY & PHYSICAL   The patient is a 35 year old white male well known to me with recurrent multiple admissions due to chronic recurrent congestive heart failure secondary to noncompliance and chronic renal failure, minimization of dialysis, some ischemic cardiomyopathy with an ejection fraction of 35- 40%, and the patient apparently came in due the increasing dyspnea.  He states he had dialysis Saturday, but did have this very short as this is his usual pattern.  He denied anginal chest pain.  He does admit to some abdominal discomfort, epigastric in nature, which is nonradiating to the chest.  He was found to have systolics in excess of 200, and with a BNP of greater than 3200, diffuse infiltrates on chest x-ray consistent with pulmonary edema and was subsequently dialyzed yesterday with taking off 4 liters.  He denies anginal chest pain.  He does have some dyspnea on exertion.  He denies orthopnea or PND.  The patient is admitted with recurrent volume overload secondary to chronic noncompliance with medicines and dialysis and will be aggressively dialyzed.  PAST MEDICAL HISTORY:  Significant for ischemic cardiomyopathy; coronary artery disease; ejection fraction 35-40%; hypertension; bipolar disorder; schizo-affective disorder; depression; restless legs syndrome; anemia secondary to chronic renal failure; chronic low back pain, nondescript; hyperlipidemia; COPD with continued smoking despite counseling; multilobar pneumonia recently; chronic renal failure.  PAST SURGICAL HISTORY:  Remarkable for the stent placement as well as stenting of the coronary arteries in two  arteries.  He has no known allergies.  Current medicines, which he is noncompliant are: 1. Aspirin 81 mg p.o. daily. 2. Labetalol 800 mg p.o. b.i.d. 3. Prinivil 20 mg p.o. daily. 4. Hydralazine 50 mg p.o. t.i.d. 5. Crestor 20 mg p.o. daily. 6. Cymbalta 60 mg p.o. daily. 7. Coreg 12.5 mg p.o. b.i.d. 8. Norvasc 10 mg p.o. daily. 9. Zyprexa 10 mg p.o. b.i.d. 10.Xanax 0.25 mg p.o. t.i.d. 11.Clonidine 0.1 mg p.o. t.i.d. 12.Plavix 75 mg p.o. daily.  PHYSICAL EXAMINATION:  VITAL SIGNS:  This morning, the patient had blood pressure of 179/106, pulse is 85 and regular, respiratory rate is 16, temperature is 97.5, O2 sat is 94%. EYES:  PERRLA.  Extraocular movements intact.  Sclerae clear. Conjunctivae pink. NECK:  No JVD, no carotid bruits, no thyromegaly, and no thyroid bruits. LUNGS:  Diminished breath sounds at the bases.  Scattered rhonchi.  No rales auscultated.  No wheeze auscultated. HEART:  Regular rhythm.  No S3 auscultated.  No S4.  No heaves, thrills, or rubs. ABDOMEN:  Obese, soft, nontender.  Bowel sounds normoactive.  No guarding, rebound, or hepatosplenomegaly. EXTREMITIES:  Trace to 1+ pedal edema. NEUROLOGIC:  Cranial nerves grossly intact.  The patient moves all four extremities.  He is alert and oriented x3.  IMPRESSION: 1. Accelerated hypertension. 2. Chronic noncompliance. 3. Chronic  ischemic cardiomyopathy, ejection fraction 35-40%. 4. Dialysis. 5. Schizo-affective disorder. 6. Depression. 7. Bipolar disorder. 8. Anxiety disorder. 9. Hyperlipidemia. 10.Anemia secondary to chronic renal failure.  The plan at present is due to continue aggressive dialysis, reinstitute his medicines.  Well will increase his labetalol to original dosage of 800 p.o. b.i.d., hydralazine 50 p.o. t.i.d., clonidine 0.1 mg p.o. t.i.d., Plavix 75 mg p.o. daily, Crestor 20 mg p.o. daily.  We will follow hemodynamics and volume overload status, and make further recommendations as  the database expands.     Melvyn Novas, MD     RMD/MEDQ  D:  03/28/2011  T:  03/29/2011  Job:  [id number]  Electronically Signed by Oval Linsey MD on 04/19/2011 01:42:34 PM

## 2011-04-23 ENCOUNTER — Emergency Department (HOSPITAL_COMMUNITY): Payer: Medicare Other

## 2011-04-23 ENCOUNTER — Emergency Department (HOSPITAL_COMMUNITY)
Admission: EM | Admit: 2011-04-23 | Discharge: 2011-04-23 | Disposition: A | Discharge status: Home / Self Care | Payer: Medicare Other | Attending: Emergency Medicine | Admitting: Emergency Medicine

## 2011-04-23 DIAGNOSIS — I509 Heart failure, unspecified: Secondary | ICD-10-CM | POA: Insufficient documentation

## 2011-04-23 DIAGNOSIS — R0602 Shortness of breath: Secondary | ICD-10-CM | POA: Insufficient documentation

## 2011-04-23 DIAGNOSIS — I120 Hypertensive chronic kidney disease with stage 5 chronic kidney disease or end stage renal disease: Secondary | ICD-10-CM | POA: Insufficient documentation

## 2011-04-23 DIAGNOSIS — N186 End stage renal disease: Secondary | ICD-10-CM | POA: Insufficient documentation

## 2011-04-23 DIAGNOSIS — K449 Diaphragmatic hernia without obstruction or gangrene: Secondary | ICD-10-CM | POA: Insufficient documentation

## 2011-04-23 DIAGNOSIS — I428 Other cardiomyopathies: Secondary | ICD-10-CM | POA: Insufficient documentation

## 2011-04-23 DIAGNOSIS — Z8659 Personal history of other mental and behavioral disorders: Secondary | ICD-10-CM | POA: Insufficient documentation

## 2011-04-23 DIAGNOSIS — J449 Chronic obstructive pulmonary disease, unspecified: Secondary | ICD-10-CM | POA: Insufficient documentation

## 2011-04-23 DIAGNOSIS — R609 Edema, unspecified: Secondary | ICD-10-CM | POA: Insufficient documentation

## 2011-04-23 DIAGNOSIS — I2510 Atherosclerotic heart disease of native coronary artery without angina pectoris: Secondary | ICD-10-CM | POA: Insufficient documentation

## 2011-04-23 DIAGNOSIS — R079 Chest pain, unspecified: Secondary | ICD-10-CM | POA: Insufficient documentation

## 2011-04-23 DIAGNOSIS — Z7982 Long term (current) use of aspirin: Secondary | ICD-10-CM | POA: Insufficient documentation

## 2011-04-23 DIAGNOSIS — Z7902 Long term (current) use of antithrombotics/antiplatelets: Secondary | ICD-10-CM | POA: Insufficient documentation

## 2011-04-23 DIAGNOSIS — D649 Anemia, unspecified: Secondary | ICD-10-CM | POA: Insufficient documentation

## 2011-04-23 DIAGNOSIS — F329 Major depressive disorder, single episode, unspecified: Secondary | ICD-10-CM | POA: Insufficient documentation

## 2011-04-23 DIAGNOSIS — Z79899 Other long term (current) drug therapy: Secondary | ICD-10-CM | POA: Insufficient documentation

## 2011-04-23 DIAGNOSIS — J4489 Other specified chronic obstructive pulmonary disease: Secondary | ICD-10-CM | POA: Insufficient documentation

## 2011-04-23 DIAGNOSIS — Z992 Dependence on renal dialysis: Secondary | ICD-10-CM | POA: Insufficient documentation

## 2011-04-23 DIAGNOSIS — F3289 Other specified depressive episodes: Secondary | ICD-10-CM | POA: Insufficient documentation

## 2011-04-23 LAB — BASIC METABOLIC PANEL
CO2: 26 mEq/L (ref 19–32)
Calcium: 11 mg/dL — ABNORMAL HIGH (ref 8.4–10.5)
Chloride: 91 mEq/L — ABNORMAL LOW (ref 96–112)
Creatinine, Ser: 7.28 mg/dL — ABNORMAL HIGH (ref 0.4–1.5)
Glucose, Bld: 100 mg/dL — ABNORMAL HIGH (ref 70–99)

## 2011-04-23 LAB — POCT CARDIAC MARKERS: Myoglobin, poc: >500 ng/mL (ref 12–200)

## 2011-04-23 LAB — CBC
MCHC: 32.7 g/dL (ref 30.0–36.0)
Platelets: 182 10*3/uL (ref 150–400)
RDW: 17.6 % — ABNORMAL HIGH (ref 11.5–15.5)

## 2011-04-23 NOTE — Consult Note (Signed)
NAME:  [name], [name] NO.:  [id number]  MEDICAL RECORD NO.:  [id number]           PATIENT TYPE:  LOCATION:                                 FACILITY:  PHYSICIAN:  Gerrit Friends. Dietrich Pates, MD, FACCDATE OF BIRTH:  Jul 17, 1976  DATE OF CONSULTATION:  02/01/2011 DATE OF DISCHARGE:                                CONSULTATION   PRIMARY CARDIOLOGIST:  Jonelle Sidle, MD.  PRIMARY CARE PHYSICIAN:  Melvyn Novas, MD.  NEPHROLOGIST:  Jorja Loa, M.D.  REASON FOR CONSULTATION:  Multiple palpitations and ventricular arrhythmia.  HISTORY OF PRESENT ILLNESS:  This is a 35 year old Caucasian malecasian male with multiple admissions for medical noncompliance, fluid overload with history of COPD, coronary artery disease, hypertension, and end-stage renal disease requiring dialysis.  He was admitted with again fluid overload, shortness of breath, and also was diagnosed with pneumonia. The patient actually went home AMA and came back the next day.  The patient is frequently in the emergency room for complaints of shortness of breath and CHF.  The patient is very noncompliant with medications, dietary restrictions, and dialysis.  The patient has been admitted and dialyzed and they have begun to remove several liters of fluid.  He is not currently at dry weight.  He has been complaining of palpitations and chest pain since admission, more often the palpitations cause him to feel mild discomfort and he states that it causes him to have some shortness of breath.  We are asked to follow making further recommendations concerning this and need for treatment if necessary.  REVIEW OF SYSTEMS:  Positive for shortness of breath, dyspnea on exertion, edema, palpitations, cough, fluid retention.  All other systems were reviewed and were found to be negative.  CODE STATUS:  Full code.  PAST MEDICAL HISTORY: 1. CAD, non-ST elevated MI in January 2010 and in September 2010  requiring bare-metal stent to the circumflex and right coronary     arteries. 2. Ischemic cardiomyopathy with an EF of 35-40% per echocardiogram in     October 2011. 3. End-stage renal disease, dialysis Monday, Wednesday, Friday. 4. Chronic CHF. 5. Hypertension with hypertensive heart disease. 6. Medical noncompliance. 7. Schizophrenia. 8. Pain medication dependency. 9. COPD. 10.Ongoing tobacco abuse. 11.Bipolar disorder.  PAST SURGICAL HISTORY:  Not listed on his H and P or prior records.  SOCIAL HISTORY:  He lives in [place] with his mother.  He is a 25- pack-year smoker.  Negative for EtOH.  History of illicit drug use in the past.  FAMILY HISTORY:  Mother with CAD.  Father with CAD.  CURRENT MEDICATIONS PRIOR TO ADMISSION:  Zofran 4 mg p.r.n., loperamide 2 mg t.i.d., acetaminophen 500 mg daily, Robitussin DM p.r.n., ProAir inhaler daily, hydrocortisone p.r.n. topically, Hydromet syrup q.4 h., Spiriva 18 mcg daily, lorazepam 0.5 mg q.i.d.  ALLERGIES:  PENICILLIN and QUININE.  CURRENT LABS:  Sodium 138, potassium 4.2, chloride 98, CO2 27, BUN 25, creatinine 5.7, glucose 96, hemoglobin 8.8, hematocrit 27.3, white blood cells 4.9, platelets 195.  BNP greater than 3200, troponin 0.04 and 0.04 respectively.  He is negative for MRSA.  Magnesium 1.7.  Chest x-ray worsening bilateral airspace disease with focality in the right middle lobe and lower lobe, findings concerning for multifocal pneumonia.  EKG revealing sinus rhythm with PVCs, prolonged QTC at 0.505, his rate is 95.  PHYSICAL EXAMINATION:  VITAL SIGNS:  Blood pressure 187/94, pulse 102, respirations 19, O2 sat 96% on room air, his weight is 95.3 kg. GENERAL:  He is awake, alert, and oriented, complaining of some mild shortness of breath and frequent palpitations. HEENT:  Head is normocephalic and atraumatic.  Eyes, PERRLA. NECK:  Supple, positive for mild JVD.  No carotid bruits  were appreciated. CARDIOVASCULAR:  Distant heart sounds and irregular.  Pulses are palpable.  No bruits are appreciated. LUNGS:  Bilateral crackles and wheezes are noted. ABDOMEN:  Soft, nontender with 2+ bowel sounds. EXTREMITY:  Positive for clubbing and positive for edema 1+ pretibial. MUSCULOSKELETAL:  No joint deformities. NEUROLOGIC:  Cranial nerves II-XII are grossly intact.  IMPRESSION: 1. Ischemic heart disease with EF of 35-40% per echo in October 2011.     He has a history of medical noncompliance to include dialysis,     meds, and drug use.  He has been started on low-dose Coreg 12.5 mg     b.i.d. to assist him with palpitations and heart rate along with     cardiac output.  He is being monitored for bronchospasms.  Heart     rate and blood pressure are not well controlled at present.  We     will increase his Coreg to 25 mg b.i.d.  Plan would be to consider     an AICD but worry about placing this with history of noncompliance. 2. Coronary artery disease status post bare-metal stent to the     circumflex and right coronary artery in 2010.  He continues on     aspirin, lisinopril, and beta-blocker.  He has had no complaints of  chest pain while hospitalized.  Our plan will be to add some     magnesium to his medication regimen in the setting of     hypomagnesemia along with frequent palpitations and prolonged QT     interval. 3. Medical noncompliance is an issue concerning placement of ICD in     the setting of ischemic cardiomyopathy.  I am not certain that he     needs to have an ICD at this time.  We will continue to try to add     beta-blockers.  He does not come to appointments and followup, it     may do more harm than good placing this ICD if he will not be     taking his meds as an outpatient.  We will follow this man and make     further recommendations watching his response to the medication     changes first and discussing the need to consider proceeding with      an ICD.  In the interim, I will order an echocardiogram just for LV     function only to evaluate if he has had significant changes in     this.  On behalf of the physicians and providers of [facility], we would like to thank Dr. Janna Arch for allowing [place] to participate in the care of this patient.     Bettey Mare. Lyman Bishop, NP   ______________________________ Gerrit Friends. Dietrich Pates, MD, [facility]    KML/MEDQ  D:  02/01/2011  T:  02/01/2011  Job:  [id number]  cc:   Melvyn Novas,  MD Fax: [phone]  Electronically Signed by Joni Reining NP on 03/21/2011 07:59:50 AM Electronically Signed by [place] Bing MD [facility] on 04/23/2011 10:13:41 PM

## 2011-04-24 ENCOUNTER — Emergency Department (HOSPITAL_COMMUNITY): Payer: Medicare Other

## 2011-04-24 ENCOUNTER — Emergency Department (HOSPITAL_COMMUNITY)
Admission: EM | Admit: 2011-04-24 | Discharge: 2011-04-24 | Disposition: A | Discharge status: Home / Self Care | Payer: Medicare Other | Attending: Emergency Medicine | Admitting: Emergency Medicine

## 2011-04-24 DIAGNOSIS — Z992 Dependence on renal dialysis: Secondary | ICD-10-CM | POA: Insufficient documentation

## 2011-04-24 DIAGNOSIS — R0602 Shortness of breath: Secondary | ICD-10-CM | POA: Insufficient documentation

## 2011-04-24 DIAGNOSIS — Z7982 Long term (current) use of aspirin: Secondary | ICD-10-CM | POA: Insufficient documentation

## 2011-04-24 DIAGNOSIS — N186 End stage renal disease: Secondary | ICD-10-CM | POA: Insufficient documentation

## 2011-04-24 DIAGNOSIS — Z7902 Long term (current) use of antithrombotics/antiplatelets: Secondary | ICD-10-CM | POA: Insufficient documentation

## 2011-04-24 DIAGNOSIS — Z79899 Other long term (current) drug therapy: Secondary | ICD-10-CM | POA: Insufficient documentation

## 2011-04-24 DIAGNOSIS — I120 Hypertensive chronic kidney disease with stage 5 chronic kidney disease or end stage renal disease: Secondary | ICD-10-CM | POA: Insufficient documentation

## 2011-04-24 DIAGNOSIS — J4489 Other specified chronic obstructive pulmonary disease: Secondary | ICD-10-CM | POA: Insufficient documentation

## 2011-04-24 DIAGNOSIS — F411 Generalized anxiety disorder: Secondary | ICD-10-CM | POA: Insufficient documentation

## 2011-04-24 DIAGNOSIS — K219 Gastro-esophageal reflux disease without esophagitis: Secondary | ICD-10-CM | POA: Insufficient documentation

## 2011-04-24 DIAGNOSIS — Z8659 Personal history of other mental and behavioral disorders: Secondary | ICD-10-CM | POA: Insufficient documentation

## 2011-04-24 DIAGNOSIS — I2510 Atherosclerotic heart disease of native coronary artery without angina pectoris: Secondary | ICD-10-CM | POA: Insufficient documentation

## 2011-04-24 DIAGNOSIS — I509 Heart failure, unspecified: Secondary | ICD-10-CM | POA: Insufficient documentation

## 2011-04-24 DIAGNOSIS — J449 Chronic obstructive pulmonary disease, unspecified: Secondary | ICD-10-CM | POA: Insufficient documentation

## 2011-04-24 DIAGNOSIS — K449 Diaphragmatic hernia without obstruction or gangrene: Secondary | ICD-10-CM | POA: Insufficient documentation

## 2011-04-24 DIAGNOSIS — I428 Other cardiomyopathies: Secondary | ICD-10-CM | POA: Insufficient documentation

## 2011-04-27 ENCOUNTER — Inpatient Hospital Stay (HOSPITAL_COMMUNITY)
Admission: EM | Admit: 2011-04-27 | Discharge: 2011-04-29 | DRG: 291 | Disposition: A | Discharge status: Home / Self Care | Payer: Medicare Other | Attending: Family Medicine | Admitting: Family Medicine

## 2011-04-27 ENCOUNTER — Emergency Department (HOSPITAL_COMMUNITY): Payer: Medicare Other

## 2011-04-27 ENCOUNTER — Inpatient Hospital Stay (HOSPITAL_COMMUNITY): Payer: Medicare Other

## 2011-04-27 DIAGNOSIS — I120 Hypertensive chronic kidney disease with stage 5 chronic kidney disease or end stage renal disease: Secondary | ICD-10-CM | POA: Diagnosis present

## 2011-04-27 DIAGNOSIS — J449 Chronic obstructive pulmonary disease, unspecified: Secondary | ICD-10-CM | POA: Diagnosis present

## 2011-04-27 DIAGNOSIS — Z9119 Patient's noncompliance with other medical treatment and regimen: Secondary | ICD-10-CM

## 2011-04-27 DIAGNOSIS — Z91199 Patient's noncompliance with other medical treatment and regimen due to unspecified reason: Secondary | ICD-10-CM

## 2011-04-27 DIAGNOSIS — I2510 Atherosclerotic heart disease of native coronary artery without angina pectoris: Secondary | ICD-10-CM | POA: Diagnosis present

## 2011-04-27 DIAGNOSIS — E785 Hyperlipidemia, unspecified: Secondary | ICD-10-CM | POA: Diagnosis present

## 2011-04-27 DIAGNOSIS — N186 End stage renal disease: Secondary | ICD-10-CM | POA: Diagnosis present

## 2011-04-27 DIAGNOSIS — J4489 Other specified chronic obstructive pulmonary disease: Secondary | ICD-10-CM | POA: Diagnosis present

## 2011-04-27 DIAGNOSIS — F319 Bipolar disorder, unspecified: Secondary | ICD-10-CM | POA: Diagnosis present

## 2011-04-27 DIAGNOSIS — I509 Heart failure, unspecified: Principal | ICD-10-CM | POA: Diagnosis present

## 2011-04-27 LAB — CBC
Hemoglobin: 10.4 g/dL — ABNORMAL LOW (ref 13.0–17.0)
MCH: 27.5 pg (ref 26.0–34.0)
MCHC: 32.2 g/dL (ref 30.0–36.0)
MCV: 85.4 fL (ref 78.0–100.0)
Platelets: 264 10*3/uL (ref 150–400)

## 2011-04-27 LAB — GLUCOSE, CAPILLARY: Glucose-Capillary: 135 mg/dL — ABNORMAL HIGH (ref 70–99)

## 2011-04-27 LAB — POCT CARDIAC MARKERS

## 2011-04-27 LAB — BASIC METABOLIC PANEL
CO2: 26 mEq/L (ref 19–32)
Chloride: 98 mEq/L (ref 96–112)
Creatinine, Ser: 6.73 mg/dL — ABNORMAL HIGH (ref 0.4–1.5)
GFR calc Af Amer: 11 mL/min — ABNORMAL LOW (ref >60)
Potassium: 3.9 mEq/L (ref 3.5–5.1)

## 2011-04-27 LAB — MRSA PCR SCREENING: MRSA by PCR: NEGATIVE

## 2011-04-28 LAB — CBC
MCH: 27.5 pg (ref 26.0–34.0)
MCHC: 32.2 g/dL (ref 30.0–36.0)
MCV: 85.4 fL (ref 78.0–100.0)
Platelets: 255 10*3/uL (ref 150–400)
RBC: 3.71 MIL/uL — ABNORMAL LOW (ref 4.22–5.81)
RDW: 17.8 % — ABNORMAL HIGH (ref 11.5–15.5)

## 2011-04-28 LAB — DIFFERENTIAL
Basophils Relative: 0 % (ref 0–1)
Eosinophils Absolute: 0.1 10*3/uL (ref 0.0–0.7)
Eosinophils Relative: 2 % (ref 0–5)
Lymphs Abs: 1.7 10*3/uL (ref 0.7–4.0)
Monocytes Absolute: 0.4 10*3/uL (ref 0.1–1.0)
Monocytes Relative: 7 % (ref 3–12)
Neutrophils Relative %: 64 % (ref 43–77)

## 2011-04-28 LAB — CARDIAC PANEL(CRET KIN+CKTOT+MB+TROPI)
Total CK: 130 U/L (ref 7–232)
Troponin I: <0.3 ng/mL (ref <0.30)

## 2011-04-28 LAB — BASIC METABOLIC PANEL
BUN: 16 mg/dL (ref 6–23)
Calcium: 11.2 mg/dL — ABNORMAL HIGH (ref 8.4–10.5)
Chloride: 96 mEq/L (ref 96–112)
Creatinine, Ser: 4.36 mg/dL — ABNORMAL HIGH (ref 0.4–1.5)
GFR calc Af Amer: 19 mL/min — ABNORMAL LOW (ref >60)
GFR calc non Af Amer: 15 mL/min — ABNORMAL LOW (ref >60)

## 2011-04-28 NOTE — H&P (Signed)
NAME:  FRIEND, DORFMAN NO.:  [id number]  MEDICAL RECORD NO.:  [id number]           PATIENT TYPE:  LOCATION:                                 FACILITY:  PHYSICIAN:  Melvyn Novas, MDDATE OF BIRTH:  1976/09/11  DATE OF ADMISSION: DATE OF DISCHARGE:  LH                             HISTORY & PHYSICAL   The patient is a 35 year old white male with chronic noncompliance on dialysis frequently cuts it short and noncompliant with many medicines. He has accelerated hypertension, two-vessel coronary disease status post stenting, diminished ejection fraction 35% to 40%, who apparently has been noncompliant for several days' time reports to the ER with increasing dyspnea, accelerated hypertension, blood pressure at 210/110 range with sinus pulse if 112-115 beats per minute, and the patient is admitted for control of hypertension, urgent dialysis to diminish intravascular volume overload, and congestive heart failure which is recurrent.  He denies any anginal chest pain.  He does have orthopnea and PND.  He denies sharp chest pain.  He does have a cough and no sputum production.  No fever, rigors, chills.  Past medical history is significant for the aforementioned pneumonia, accelerated hypertension, end-stage renal disease on dialysis, two- vessel coronary artery disease with stenting, COPD, bipolar disorder, schizoaffective disorder, restless legs syndrome, depression, anemia secondary to chronic renal failure, chronic noncompliance, hyperlipidemia.  Past surgical history is remarkable for the coronary artery disease stenting and appendectomy.  He also has herniated nucleus pulposus in the lumbosacral distribution which may have had a laminectomy.  He has no known allergies.  CURRENT MEDICATIONS: 1. Aspirin 81 mg p.o. daily. 2. Labetalol 800 mg p.o. b.i.d. 3. Prinivil 20 mg p.o. daily. 4. Hydralazine 50 mg p.o. t.i.d. 5. Crestor 20 mg p.o. daily. 6.  Cymbalta 60 mg p.o. daily. 7. Corey 12.5 mg p.o. b.i.d. 8. Norvasc 10 mg p.o. daily. 9. Zyprexa 10 mg p.o. b.i.d. 10.Xanax 0.5 mg p.o. t.i.d. 11.Clonidine 0.1 mg p.o. t.i.d. 12.Plavix 75 mg p.o. daily.  PHYSICAL EXAMINATION:  VITAL SIGNS:  Blood pressure is 210/114, pulse is 112 sinus rhythm and sinus tachycardia.  He is afebrile, respiratory rate is 22. EYES:  PERRLA intact.  Sclerae clear.  Conjunctivae pink. NECK:  No JVD.  No carotid bruits.  No thyromegaly or thyroid bruits. LUNGS:  Diminished breath sounds at the bases.  Bilateral basilar rales with scattered rhonchi.  Prolonged expiratory phase. HEART:  Regular rhythm.  There is an S3 gallop audible.  No S4.  No heaves, thrills, or rubs appreciable. ABDOMEN:  Soft, nontender.  Bowel sounds normoactive.  No rebound, mass, or organomegaly. EXTREMITIES:  Trace to 1+ pedal edema. NEUROLOGIC:  The patient is sitting up alert and oriented over three spheres.  Cranial nerves grossly intact.  The patient moves all four extremities.  IMPRESSION: 1. Accelerated hypertension. 2. Congestive heart failure secondary to above. 3. Chronic noncompliance. 4. End-stage renal disease status post dialysis. 5. Diminished ejection fraction of 35% to 40% with two-vessel coronary     disease status post stenting. 6. Hyperlipidemia. 7. Chronic obstructive pulmonary disease. 8. Schizoaffective disorder. 9. Bipolar disorder. 10.Generalized anxiety disorder.  PLAN:  Admit for urgent dialysis.  We will resume antihypertensive oral medicines and one set of cardiac enzymes and we will make further recommendations as the database expands observing CBC and BMET in the a.m. as well as Lovenox.     Melvyn Novas, MD     RMD/MEDQ  D:  04/27/2011  T:  04/28/2011  Job:  [id number]  Electronically Signed by Oval Linsey MD on 04/28/2011 03:10:06 PM

## 2011-04-29 ENCOUNTER — Inpatient Hospital Stay (HOSPITAL_COMMUNITY): Payer: Medicare Other

## 2011-04-29 LAB — BASIC METABOLIC PANEL
BUN: 35 mg/dL — ABNORMAL HIGH (ref 6–23)
CO2: 29 mEq/L (ref 19–32)
Glucose, Bld: 121 mg/dL — ABNORMAL HIGH (ref 70–99)
Potassium: 3.4 mEq/L — ABNORMAL LOW (ref 3.5–5.1)
Sodium: 138 mEq/L (ref 135–145)

## 2011-04-29 LAB — DIFFERENTIAL
Basophils Absolute: 0 10*3/uL (ref 0.0–0.1)
Basophils Relative: 0 % (ref 0–1)
Eosinophils Absolute: 0.3 10*3/uL (ref 0.0–0.7)
Monocytes Relative: 6 % (ref 3–12)
Neutro Abs: 4.7 10*3/uL (ref 1.7–7.7)
Neutrophils Relative %: 65 % (ref 43–77)

## 2011-04-29 LAB — CBC
Hemoglobin: 9.6 g/dL — ABNORMAL LOW (ref 13.0–17.0)
MCH: 27 pg (ref 26.0–34.0)
MCHC: 31.3 g/dL (ref 30.0–36.0)
Platelets: 238 10*3/uL (ref 150–400)
RBC: 3.56 MIL/uL — ABNORMAL LOW (ref 4.22–5.81)

## 2011-05-03 NOTE — Consult Note (Signed)
NAME:  [name], [name]               ACCOUNT NO.:  [id number]   MEDICAL RECORD NO.:  [id number]          PATIENT TYPE:  INP   LOCATION:  A201                          FACILITY:  [facility]   PHYSICIAN:  R. Roetta Sessions, M.D. DATE OF BIRTH:  21-Feb-1976   DATE OF CONSULTATION:  DATE OF DISCHARGE:                                 CONSULTATION   REQUESTING PHYSICIAN:  [facility].   REASON FOR CONSULTATION:  Abdominal pain.   PRIMARY CARE PHYSICIAN:  Lia Hopping, MD.   NEPHROLOGIST:  Jorja Loa, M.D.   HISTORY OF PRESENT ILLNESS:  [name] is a 35 year old Caucasian male  who presented to the hospital for detox.  He says he has been taking  too much of his Lortab.  He is taking this for chronic back pain.  He  tells me 4 days ago, he began to have lower abdominal cramps and pain  about 9:30 in the evening while he was watching TV.  He states the pain  was around the umbilicus.  It is intermittent and lasts anywhere from 1-  1/2 hours to 2 hours.  It is like a knife.  He rates the pain 10/10 at  worst.  He had some nausea and vomiting, but this has resolved.  He did  have diarrhea all night long 4 days ago.  He has not had a bowel  movement except for one normal bowel movement yesterday.  He has had no  further diarrhea at this point.  He denies any rectal bleeding, melena  or mucus in his stools.  He tells me he did have fever at home, but this  has also resolved.  He was taking Voltaren at home.  He is also on  omeprazole for his history of GERD.  He denies any breakthrough  heartburn or indigestion at this time.  He denies anorexia.  He tells me  he has intentionally lost 105 pounds in the last 1-1/2 years.  His  abdominal pain he tells me he has eased off at this point.  He did have  a CT of the abdomen and pelvis with contrast October 13, 2008.  He was  found to have cardiomegaly, patchy bibasilar opacities, left greater  than right, atelectasis versus infiltrate, atrophic  kidneys, trace  amount of free fluid in the pelvis.  He was started on Levaquin.   PAST MEDICAL/SURGICAL HISTORY:  1. Hypertension.  2. Bipolar disorder.  3. Chronic kidney disease on dialysis Tuesday, Thursday, Saturday.  4. Hyponatremia.  5. Mild mental retardation.  6. Depression.  7. Chronic knee and low back pain.  8. Congestive heart failure.  9. Left upper arm fistula.  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  11.Arthritis.   MEDICATIONS PRIOR TO ADMISSION:  1. Prilosec 20 mg daily.  2. Avapro, unknown dose daily.  3. Atenolol, unknown dose daily.  4. Restoril p.r.n.  5. Xanax, unknown dose daily.  6. Paroxetine HCL 40 mg daily.  7. Lasix 40 mg b.i.d.  8. Desmopressin 0.8 mg q.h.s.  9. Lortab p.r.n.  10.Labetalol 300 b.i.d.  11.PhosLo 667 two tablets t.i.d.  12.Sodium bicarb  650 mg b.i.d. pain.  13.Cymbalta 60 mg daily.  14.Requip 1 mg at bedtime.  15.Zyprexa 10 mg b.i.d.  16.BuSpar ER 4 mg b.i.d.  17.Amlodipine 10 mg daily.  18.Robaxin 750 mg q.i.d. p.r.n.   ALLERGIES:  Multiple and include:  1. ASPIRIN.  2. TYLENOL.  3. METHADONE.  4. IBUPROFEN.  5. NAPROXEN.  6. FENTANYL.  7. LITHIUM.  8. HYDROMORPHONE.   FAMILY HISTORY:  [name] father deceased of metastatic carcinoma,  unknown etiology in his 74s.  Mother is age 61.  She has cancer of  abdominal organs, he is not sure whether this is colon or gastric.  He  tells me it may have been pancreatic as well.  He tells me this is a  recurrence.  He has 5 siblings with history significant for  diabetes  mellitus.   SOCIAL HISTORY:  [name] lives in [place] with his sister.  He has  been divorced twice.  He does not have any children.  He has a 20 pack  year history of tobacco use, currently smokes about half pack a year.  Denies any drug use or alcohol use.   REVIEW OF SYSTEMS:  See HPI, otherwise negative.   PHYSICAL EXAMINATION:  VITAL SIGNS:  Weight 106.5 kg, height 71 inches,  temperature 97, pulse 89,  respirations 20, blood pressure 183/110, O2  sat 94% on room air.  GENERAL:  [name] is a well-developed, well-nourished Caucasian male  who is alert, oriented, pleasant, cooperative and in no acute distress.  HEENT.  Sclerae are clear, nonicteric.  Conjunctiva pink.  Oropharynx  pink and moist without any lesions.  NECK:  Supple without any mass or thyromegaly.  CHEST:  Heart regular rate and rhythm.  Normal S1-S2 without any  murmurs, clicks, rubs or gallops.  He does have gynecomastia as well as  a tattoo.  LUNGS:  With decreased breath sounds bilaterally, otherwise clear to  auscultation.  ABDOMEN:  Positive bowel sounds x4.  No bruits auscultated.  Soft,  nondistended.  He has mild tenderness around the umbilicus and to  bilateral lower quadrant.  There is no rebound tenderness or guarding.  No hepatosplenomegaly or mass.  EXTREMITIES:  With multiple tattoos.  No clubbing.  He does have 1+  lower pretibial edema bilaterally.   LABORATORY DATA:  WBC 5.5, hemoglobin 11.4, hematocrit 32.3, platelets  189, INR 1, calcium 9, sodium 126, potassium 4.7, chloride 91, CO2 22,  BUN 35, creatinine 9.02, calcium 72, total protein 5.7.  LFTs are  otherwise normal.  Amylase 96, lipase 25 which is down from 129.  Acetaminophen/salicylate levels low.  Urine drug screen positive for  benzodiazepines and opiates.  Alcohol less than 5.  Urinalysis positive  for glucose, protein and a small amount of blood.   IMPRESSION:  [name] is a 35 year old Caucasian male with acute  abdominal pain and diarrhea which has resolved.  His CT showed a tiny  amount of free fluid in the pelvis, but nothing to explain his low  abdominal pain.  I suspect he may have gastroenteritis to attribute his  pain and diarrhea.  It seems to be self limited.  He has anemia most  likely secondary to chronic disease.  He also has hyponatremia.   PLAN:  1. Would recheck CBC in the morning.  2. Supportive measures.  3. If  his diarrhea returns, we will need a full set of stool studies      to include ova, parasite culture  and sensitivity, C. diff and      lactoferrin.  4. Hemoccult stools x3.  5. Anemia panel.   We would like to thank the [facility] for allowing [place] to  participate in the care of [name].      Lorenza Burton, N.P.      Jonathon Bellows, M.D.  Electronically Signed    KJ/MEDQ  D:  10/14/2008  T:  10/14/2008  Job:  [id number]   cc:   Jorja Loa, M.D.  Fax: [phone]   Lia Hopping  Fax: [phone]

## 2011-05-03 NOTE — Cardiovascular Report (Signed)
[name], [name] NO.:  [id number]   MEDICAL RECORD NO.:  [id number]          PATIENT TYPE:  INP   LOCATION:  2903                         FACILITY:  [facility]   PHYSICIAN:  Veverly Fells. Excell Seltzer, MD  DATE OF BIRTH:  October 28, 1976   DATE OF PROCEDURE:  DATE OF DISCHARGE:                            CARDIAC CATHETERIZATION   PROCEDURE:  PTCA and stenting of the left circumflex, PTCA and stenting  of the right coronary artery, Angio-Seal of the right femoral artery.   INDICATIONS:  Mr. Hessel is a 35 year old gentleman who presented with an  acute coronary syndrome.  He underwent diagnostic catheterization that  demonstrated critical two-vessel CAD with severe stenosis of the right  coronary artery and left circumflex.  He has end-stage renal disease.  He was referred for two-vessel PCI.  We planned on using a bare metal  stent platform because of questionable long-term compliance.   Risks and indications of procedure were reviewed with the patient and  informed consent was obtained.  The right groin was prepped and draped  and anesthetized with 1% lidocaine using modified Seldinger technique.  A 6-French sheath was placed in the right femoral artery.  Angiomax was  used for anticoagulation.  The patient has been preloaded with Plavix.  A 6-French XB 3.5 cm guide catheter was inserted.  Initial angiography  demonstrated a severe stenosis in the mid circumflex at the origin of  the first OM.  There is a 90-95% eccentric stenosis in that region.  Once a therapeutic ACT was achieved, the lesion was wired easily with a  Cougar guidewire.  The vessel was then predilated with a 3.0 x 15 mm  apex balloon, which was taken to 10 atmospheres.  Following balloon  dilatation, the balloon was left in place to assess the length of the  lesion.  I elected to treat the vessel with a 4.0 x 18 mm Vision stent,  which was deployed at 14 atmospheres and appeared well expanded.  Following  stenting, there was slight haziness off the distal aspect of  the stent but there was an excellent angiographic result.  The vessel  was very large proximally and I suspected the stent was little bit  undersized.  I, therefore, postdilated with a 4.5 x 15 mm Quantum  Maverick balloon, which was taken to 14 atmospheres distally and 18  atmospheres proximally.  After post dilatation, there was a large H  dissection off the distal edge of the stent extending into the  bifurcation of two OM branches.  At that point, an ACT was redrawn to  make sure that this did not represent thrombus.  The ACT remained  therapeutic over 300 seconds.  The same 3.0 balloon was passed down and  two prolonged inflations were done to 8 atmospheres proximally and 6  atmospheres distally.  There was severe residual stenosis and TIMI II  flow into the higher OM with TIMI grade 0 flow into the lower OM branch.  Both were equal size branches.  At that point I elected to stent the  vessel and a 3.0 x 28 mm  Multilink Vision stent was used.  It was  carefully positioned so that it overlapped to the proximal stent.  The  stent was deployed at 12 atmospheres.  Following stenting there was a  good result with restoration of TIMI III flow into the superior branch  and TIMI II flow into the inferior branch.  At that point, I elected to  post dilate the entire segment with a 3.5 x 20 mm Quantum Maverick,  which was taken to 14 atmospheres distally and 18 atmospheres  proximally.  There is a great improvement in flow at that point, the  superior branch had completely normal flow, the inferior branch had TIMI  III flow, but dissection plane was still visible.  I elected to wire the  inferior branch with a Cougar guidewire.  The branch was then ballooned  with a 2.0 x 20 mm apex balloon which was taken to 6 atmospheres for 1  minute.  At the completion of the procedure, there was an excellent  result with TIMI III flow in all  branches.  The patient was chest pain  free and his ST changes had completely resolved.  Attention was then  turned to the right coronary artery.  A JR-4 guide catheter was used.  A  Cougar guidewire was passed easily beyond the area of 90% stenosis in  the midportion of the right coronary artery.  The vessel was predilated  with a 3.0 x 15 mm apex balloon taken to 8 atmospheres on a single  inflation.  The vessel was then stented with 3.0 x 15 mm Multilink  Vision stent which was carefully positioned and deployed at 14  atmospheres.  The stent covered the lesion well.  Following stenting, I  elected to post dilate with 3.5 x 12 mm Sanford Voyager balloon which was  taken to 16 atmospheres for a single inflation.  At completion of the  procedure, there was a good angiographic result.  There was no  significant residual stenosis.  There is diffuse disease throughout the  mid part of the vessel, but it is clearly nonobstructive.  A femoral  angiogram was performed and an Angio-Seal was used to close the femoral  arteriotomy.   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   FINAL CONCLUSIONS:  Successful two-vessel PCI using overlapping bare  metal stents in the left circumflex and a single bare metal stent in the  mid-right coronary artery.   RECOMMENDATIONS:  I recommend a minimum of 1 month of dual antiplatelet  therapy with aspirin and Plavix.      Veverly Fells. Excell Seltzer, MD  Electronically Signed     MDC/MEDQ  D:  01/07/2009  T:  01/08/2009  Job:  [id number]

## 2011-05-03 NOTE — H&P (Signed)
[name], [name]               ACCOUNT NO.:  [id number]   MEDICAL RECORD NO.:  [id number]          PATIENT TYPE:  INP   LOCATION:  IC07                          FACILITY:  [facility]   PHYSICIAN:  Marcello Moores, MD   DATE OF BIRTH:  09/15/1976   DATE OF ADMISSION:  10/10/2007  DATE OF DISCHARGE:  LH                              HISTORY & PHYSICAL   PRIMARY MEDICAL DOCTOR:  Unassigned.  He goes in [name] to Dr. Olena [name].   CHIEF COMPLAINT:  Xanax overdose.   HISTORY OF PRESENT ILLNESS:  [name] is 35 year old man with history  of congestive heart failure, anxiety/depression, mental retardation,  hypertension, and end-stage renal failure who was brought by his sister  for Xanax overdose.  As per the sister, he visited his PMD yesterday and  he got a prescription for Xanax, and within 24 hours he took 29 tablets  of 0.25 mg of Xanax.  He became drowsy and sleepy and she brought him to  the emergency room.  Other than that, he has no other complaints.  No  seizure, no shortness of breath, and he was stable with his vital signs  as well.  The patient is awake on questioning but he is drowsy.  No  other detailed history was obtained from his sister or from him.   REVIEW OF SYSTEMS:  A 10-point review of system is limited, as the  patient is not giving any reasonable history.   ALLERGIES:  He is allergic to ASPIRIN.   SOCIAL HISTORY:  He is a chronic current smoker, lives with his sister,  denied alcohol abuse.   FAMILY HISTORY:  As stated in the social history.  He has a sister who  lives with him.   PAST MEDICAL HISTORY:  1. Anxiety.  2. Congestive heart failure.  3. Depression.  4. Hypertension.  5. Mental retardation.  6. End-stage renal disease, who is supposed to start tomorrow his      first dialysis.   HOME MEDICATIONS:  1. Prilosec.  2. Avapro, unspecified dose.  3. Restoril, unspecified dose.  4. Xanax, unspecified dose.  5. Zyprexa.  6. Paroxetine.  7. Lasix.  8. Desmopressin.  9. Norvasc.  10.Lortab.  11.Ventolin.  12.Labetalol.  13.PhosLo.  14.Sodium bicarbonate.   PHYSICAL EXAMINATION:  The patient is lying in the emergency room bed  without any distress, sleepy, and can be awakened by stimulus and  talking, but he is not talking much.  VITAL SIGNS:  Blood pressure is 137/83, respiratory rate is 20,  temperature 97, pulse is 75, saturation is 100% on 2 L of oxygen.  HEENT:  Has pink conjunctivae.  Nonicteric sclerae.  NECK:  Supple.  CHEST:  Good air entry bilateral.  CARDIOVASCULAR:  S1, S2 regular.  No murmur.  ABDOMEN:  Soft.  No area of tenderness.  EXTREMITY:  No pedal edema.  CENTRAL NERVOUS SYSTEM:  He is sleepy and drowsy.  There is no  neurological deficit.   LABORATORIES:  White blood cell is 8.8, hemoglobin is 11, hematocrit 31,  and platelet count is 270.  On the  chemistry, sodium is 128, potassium  is 4.3, chloride is 99, bicarb 15, glucose is 95, BUN is 52, creatinine  is 7.9, calcium is 7.8.  Alcohol level is less than 5.  Urine screen for  drugs showed positive for opiates and positive for benzodiazepines.  Acetaminophen level is less than 1.   ASSESSMENT:  1. Drug overdose with Xanax as per the history.  The patient is      currently stable except he is little bit drowsy.  Will admit him to      ICU and will monitor him and watch him overnight.  If he remains      stable overnight, he might be able to be discharged tomorrow to      have his first dialysis as scheduled.  2. End-stage renal.  Dialysis to be started tomorrow, his first      dialysis.  Will repeat BMET tomorrow morning and will discuss with      nephrology, Dr. Kristian [name], whether he has to be dialyzed here or as      an outpatient tomorrow.  3. Acidosis.  Will do ABG to see if there is any component of      respiratory acidosis; otherwise, probably it is related to his end-      stage renal disease.   Otherwise, the patient is relatively stable.   Will put him on DVT and GI  prophylaxis, and will put him on IV fluid and continuous pulse oximetry  and monitoring.      Marcello Moores, MD  Electronically Signed     MT/MEDQ  D:  10/10/2007  T:  10/11/2007  Job:  [id number]

## 2011-05-03 NOTE — Discharge Summary (Signed)
[name], [name]               ACCOUNT NO.:  [id number]   MEDICAL RECORD NO.:  [id number]          PATIENT TYPE:  INP   LOCATION:  A201                          FACILITY:  [facility]   PHYSICIAN:  Osvaldo Shipper, MD     DATE OF BIRTH:  1976/07/05   DATE OF ADMISSION:  10/13/2008  DATE OF DISCHARGE:  10/28/2009LH                               DISCHARGE SUMMARY   PRIMARY CARE PHYSICIAN:  Dr. Lia Hopping.   NEPHROLOGIST:  Jorja Loa, M.D.   He gets dialyzed in [place] on Tuesdays, Thursdays and Saturdays.   DISCHARGE DIAGNOSES:  1. Severe hyponatremia secondary to DDAVP improved.  2. Abdominal pain possibly from gastroenteritis improving.  3. End-stage renal disease on hemodialysis stable.  4. Uncontrolled hypertension improved.  5. Possible pneumonia.   BRIEF HOSPITAL COURSE:  Briefly, this is a 35 year old white male who  presented to the ED with complaints of abdominal pain and requesting  detox from narcotics.  The patient had slightly elevated lipase on the  setting of end-stage renal disease.  His lipase normalized within a day.  The patient underwent CAT scan of the abdomen and pelvis which did not  reveal any acute intra-abdominal process.  He was seen by  gastroenterology who felt that his symptoms were secondary to  gastroenteritis.  The patient's pain has improved.  He still has some  symptoms but it is much improved.  He does not have any nausea or  vomiting and he tolerated his p.o. intake last night.  He denies any  diarrhea as well.   He also had severe hyponatremia with sodium of 121.  He is on DDAVP for  diabetes insipidus.  The DDAVP was held and sodium was coming up to 127.  Dr. Kristian Covey has asked him to stop taking this medication.  I have told  the patient to discuss this issue with Dr. Kristian Covey when he sees him in  followup.   His blood pressure was also not very well-controlled.  I have started  him on hydralazine and his blood pressure last night came  down to  156/92, better than what it has been.  I have asked him to follow up  with his PMD for further instructions regarding his blood pressure.   His other medical issues remained stable.   He was also found to have possible infiltrate in his lungs and so he was  started on Levaquin which we will continue for 5 more doses.   On the day of discharge the patient is feeling well.  No complaints are  offered.  Abdominal pain still persists but much improved.  Denies any  nausea or vomiting.  No other complaints.   DISCHARGE PHYSICAL EXAMINATION:  VITAL SIGNS:  He is afebrile.  Heart  rate in the 90s, regular.  Respiratory rate is 20.  Blood pressure is  156/92, saturation 97% on room air.  LUNGS:  Clear to auscultation bilaterally.  CARDIOVASCULAR:  S1, S2 normal.  Regular.  No murmurs appreciated.  ABDOMEN:  Soft.  Slight tenderness in the epigastric area.  No rebound,  rigidity or guarding  is present.  No masses or organomegaly is  appreciated.  EXTREMITIES:  Do not show any edema.   LABS:  His white count is normal.  Hemoglobin is 11.4.  His platelet  count is normal.  Sodium is up to 127.  Potassium is normal.  He does  have renal failure which is chronic.   He is stable for discharge pending clearance from gastroenterology.   DISCHARGE MEDICATIONS:  1. Levaquin 250 mg every 48 hours for 5 doses for a total of 10 days.  2. Hydralazine 50 mg every 6 hours.  3. Lorcet 10/650 every 8 hours as needed for pain, 20 tablets      prescribed.  Otherwise he will continue the following medications:  4. Cymbalta 60 mg daily.  5. PhosLo 667 mg t.i.d.  6. Requip 1 mg at bedtime.  7. Omeprazole 20 mg daily.  8. Phenergan as needed.  9. Zyprexa 10 mg b.i.d.  10.Labetalol 300 mg b.i.d.  11.Vospire every 12 hours.  12.Paxil 40 mg daily.  I wonder why he is on Paxil and Cymbalta.  He      needs to talk to his doctor about this.  13.Norvasc 10 mg daily.  14.Robaxin 750 mg q.i.d.  15.He  has been asked to discontinue the desmopressin, the Voltaren and      the Lortab.   FOLLOWUP:  Follow up with his PMD in 1 week, with Dr. Kristian Covey and  dialysis as scheduled, with GI as determined by them.   DIET:  A heart healthy diet.   PHYSICAL ACTIVITY:  As before.   Total time of this discharge was about 40 minutes.      Osvaldo Shipper, MD  Electronically Signed     GK/MEDQ  D:  10/15/2008  T:  10/15/2008  Job:  [id number]   cc:   Jorja Loa, M.D.  Fax: [phone]   Lia Hopping  Fax: [phone]
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
## 2011-05-03 NOTE — Cardiovascular Report (Signed)
NAMEKENSON, GROH NO.:  [id number]   MEDICAL RECORD NO.:  [id number]          PATIENT TYPE:  INP   LOCATION:  2903                         FACILITY:  [facility]   PHYSICIAN:  Bevelyn Buckles. Bensimhon, MDDATE OF BIRTH:  05-Aug-1976   DATE OF PROCEDURE:  01/05/2009  DATE OF DISCHARGE:                            CARDIAC CATHETERIZATION   PATIENT IDENTIFICATION:  [name] is a 35 year old man with a history  of end-stage renal disease, ongoing tobacco use, bipolar disorder and  chronic pain syndrome who was initially brought to the [facility]  emergency room with chest pain and inferior ST elevation on his  electrocardiogram.  He was evaluated by Dr. Riley Kill.  Subsequent EKG  showed a significant resolution of his ST elevation and his pain was  well-controlled.  Thus the code STEMI was called off.  He is now brought  to the cath lab for a diagnostic angiography.  He is currently pain  free.   PROCEDURES PERFORMED:  1. Selective coronary angiography.  2. Left heart cath.  3. Left ventriculogram.  4. Star[employer] common femoral artery closure.   DESCRIPTION OF PROCEDURE:  The risks and indications of the  catheterization were explained.  Consent was signed and placed on the  chart.  A 5-French arterial sheath was placed in the right femoral  artery using a modified Seldinger technique.  Standard catheters  including a JL-4, JR-4 and angled pigtail were used for the procedure.  Old catheter exchange was made over a wire.  There were no apparent  complications.  Central aortic pressure was 116/68 with a mean of 91.  LV pressure was 129/6 with an EDP of 20.  There was no aortic stenosis.   The coronary arteries were quite large.   Left main was normal.   LAD is a very large vessel wrapping the apex that gave off a diagonal  branch.  There was a 40% tubular lesion proximally.   Left circumflex was a large system.  It gave off a large OM-1, a large  OM-2, a moderate-sized  OM-3, and a small OM-4.  There was a 95% lesion  in the mid AV groove circumflex followed by diffuse 50% lesion distally.  In the body of the OM-1, there was a 40-50% tubular lesion.   Right coronary artery was a large dominant vessel that gave off an RV  branch, a PDA and a posterolateral.  There was a 40% lesion proximally,  a 95% hazy lesion in the midsection at the takeoff of the RV branch and  a 30% lesion distally.   Left ventriculogram done in the RAO position showed an EF of 45% with  inferior hypokinesis.   ASSESSMENT:  1. Aborted inferior ST elevation myocardial infarction with severe two-      vessel coronary artery disease in the right coronary and left      circumflex.  It is unclear to me which artery is the infarct      related artery.  2. Left ventricular ejection fraction of 45% with inferior      hypokinesis.  3. End-stage renal disease.  4. Chronic pain syndrome.   PLAN:  We will admit him to the CCU.  We will resume heparin 4 hours  after sheath pull and start him on Plavix.  He will likely need a two-  vessel percutaneous intervention in the a.m. after hemodialysis.  We  will watch him closely.      Bevelyn Buckles. Bensimhon, MD  Electronically Signed     DRB/MEDQ  D:  01/05/2009  T:  01/05/2009  Job:  [id number]

## 2011-05-03 NOTE — Consult Note (Signed)
[name], [name]               ACCOUNT NO.:  [id number]   MEDICAL RECORD NO.:  [id number]          PATIENT TYPE:  INP   LOCATION:  2926                         FACILITY:  [facility]   PHYSICIAN:  Aram Beecham B. Eliott Nine, M.D.DATE OF BIRTH:  02-05-1976   DATE OF CONSULTATION:  01/19/2009  DATE OF DISCHARGE:                                 CONSULTATION   REASON FOR CONSULTATION:  Provision of dialysis-related services,  management of fluid overload, and hypertension.   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  He dialyzes on a Tuesday,  Thursday, and Saturday schedule.  He states that last week, the unit  altered their schedule to Tuesday, Thursday, and Friday with plans to  dialyze the patient again today on Monday because of the snow issue on  Friday and Saturday.  He did receive a full treatment on January 16, 2009.  He states he left the unit at 100 kg (predialysis weight 103).  According to his recollection, his dry weight is 94.3 kg which would  make him 11.3 kg over his dry weight when he left.  However, available  treatment sheets from that unit indicated a dry weight of 99.5 kg,  therefore making him only 0.5 kg above his dry weight.   He developed shortness of breath the next day, Saturday and this became  progressive, although he did not have any chest pain, nausea, or  vomiting.  He did have a cough without sputum production.  Relevant to  his current admission is an admission on January 18 for a non-ST-  elevation MI.  He underwent a cardiac cath and had 2 bare-metal stents  overlapping and placed in the left circumflex and a single bare-metal  stent in the mid right coronary artery.  He dialyzed here at the time of  this procedure without issue.   He does not report any issues with hypotension on dialysis.  He did say  that he had  edema when he left the unit on Friday.   PAST MEDICAL HISTORY:  1. ESRD, on dialysis for couple of years, followed by Dr. Kristian Covey at      the [facility].  2. Hypertension.  3. Secondary hyperparathyroidism.  4. Anemia.  5. Bipolar disorder which by his report is well controlled on      medications.  6. History of chronic pain disorder.  7. Coronary artery disease status post ST elevation MI, January 05, 2009, with bare-metal stent placement as previously dictated.   OUTPATIENT MEDICINES:  1. Hydralazine 50 mg q.i.d.  2. Renvela 5 with meals and 3 with snacks.  3. Labetalol 300 mg b.i.d.  4. ReQuip 1 mg at bedtime.  5. Robaxin 750 mg q.i.d.  6. Prilosec 20 mg a day.  7. Zyprexa 10 mg per day.  His medical record form are also includes Cymbalta 60 mg a day and  aspirin 325 mg a day.  FAMILY HISTORY:  Positive for coronary artery disease, cancer, and renal  failure in an aunt and grandmother, both of whom were on dialysis and  both of whom are deceased.  His father actually died of cancer, and his  mother has some sort of GI malignancy.   SOCIAL HISTORY:  The patient presently lives with his mother.  He is  disabled but previously worked with his family members in [employer].  He is divorced.  He has no children.  He still  smokes but does not use alcohol or street drugs.   REVIEW OF SYSTEMS:  Positive for cough and shortness of breath as well  as some chills with no fever.  No nausea, vomiting, abdominal pain,  diarrhea, or constipation.  He does report edema of the lower  extremities.   PHYSICAL EXAMINATION:  GENERAL:  He is afebrile.  He does not appear  acutely ill.  He has multiple tattoos on the skin.  VITAL SIGNS:  His heart rate is 120, blood pressure 168/116.  NECK:  JVP approximately 6 cm.  CARDIAC:  He has 2/6 murmur at upper sternal border.  No diastolic  murmur.  No pericardial friction rub.  LUNGS:  He has crackles in both  lung fields about half way up  posteriorly.  ABDOMEN:  Soft without tenderness.  He has no hepatosplenomegaly and no  focal abdominal tenderness.  EXTREMITIES:  Patent left upper extremity access with good thrill and  bruit.   PERTINENT LABORATORY DATA:  Sodium 134, potassium 3.9, chloride 99, CO2  of 22, BUN 27, and creatinine 10.78.  Troponin was less than 0.05.  BNP  greater than 3200.  Hemoglobin 10.9, hematocrit 32.3, and WBC 14,900.   IMPRESSION:  1. End-stage renal disease with volume overload, CHF.  The patient is      off schedule due to inclement whether but his unit did provide him      with dialysis on Friday and it does sound as if he got down within      0.5 kg of the documented dry weight, although his recollection of      the dry weight is obviously off.  His degree of symptomatology      seems somewhat disproportionate to his physical exam and chest x-      ray but clearly he has congestive heart failure related to some      component of volume overload.  He will dialyze today and then again      tomorrow to get back on a Tuesday, Thursday, and Saturday schedule      (even if his unit is doing Monday, Thursday, and Saturday this      week, this should get him back on to his regular schedule by      Thursday).  We will weigh him pre and post dialysis to determine      just how far above his dry weight he really is and increase his      goal proportionally.  2. Anemia.  Continue Aranesp (for EPO) as well as weekly iron.  3. Secondary hyperparathyroidism - continue vitamin D and binders.  4. Hypertension, poorly controlled.  Volume is undoubtedly playing a      role.  Meds will be continued and his volume will be decreased with      dialysis.  5. Bipolar disorder, on medication.  6. Coronary artery disease status post percutaneous coronary      intervention and stents,  January 18 - no evidence for myocardial      ischemia this admission.  Medications per  Cardiology.   Thanks for asking [place] to see him.  We will continued to provide dialysis  this admission.      Duke Salvia Eliott Nine, M.D.  Electronically Signed     CBD/MEDQ  D:  01/19/2009  T:  01/20/2009  Job:  [id number]
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
## 2011-05-03 NOTE — Discharge Summary (Signed)
NAMEJJESUS, [name]               ACCOUNT NO.:  [id number]   MEDICAL RECORD NO.:  [id number]          PATIENT TYPE:  INP   LOCATION:  IC07                          FACILITY:  [facility]   PHYSICIAN:  Marcello Moores, MD   DATE OF BIRTH:  17-Jan-1976   DATE OF ADMISSION:  10/10/2007  DATE OF DISCHARGE:  10/23/2008LH                               DISCHARGE SUMMARY   PRIMARY MEDICAL DOCTOR:  Dr. Lia Hopping at [facility].   DISCHARGE DIAGNOSES:  1. Drug overdose with Xanax, watched for over 24 hours, stable.  2. Anxiety.  3. History of congestive heart failure.  4. Depression.  5. Hypertension.  6. Renal failure.  He will be started today on his first dialysis.  7. Mental retardation.   HOME MEDICATIONS:  1. PhosLo 667 mg, two capsules three times a day.  2. Sodium bicarbonate 650 mg two times a day.  3. Amlodipine 10 mg daily.  4. Labetalol 300 mg two times a day.  5. Omeprazole 20 mg p.o. daily.  6. Cymbalta 60 mg p.o. daily.  7. Paroxetine 40 mg p.o. daily.  8. Desmopressin 0.2 mg four tablets at bedtime.  9. Zyprexa 10 mg, one tablet every 12 hours.  10.Furosemide 40 mg b.i.d.   HOSPITAL COURSE:  Mr. Hubbert is a 35 year old man with the above past  medical history and he presented by his sister after he took 31 tablets  of Xanax of 0.5 mg in 24 hours and he became drowsy and sleepy and she  brought him to the emergency room.  The patient after he was admitted to  the ICU for observation and monitoring and he remained very stable, he  is awake and he is taking his breakfast now without any vomiting, and he  remained stable and he will be discharged today to have dialysis today  either as an outpatient or they might dialyze him here before he goes  home and Dr. Kristian Covey is seeing him and before he will be discharged he  will be evaluated by ACT Team for further followup and if he is cleared  by the ACT Team he will be going home today.   PHYSICAL EXAMINATION:  GENERAL:  Today he is  stable.  VITAL SIGNS:  Blood pressure 130/80, pulse rate is 82, temperature 98,  and respiratory rate is 16 per minute, and saturation 100% on room air.  HEENT:  Pink conjunctivae.  NECK:  Supple.  CHEST:  Good air entry bilateral.  CVS:  S1 S2 regular.  ABDOMEN:  Soft, nontender.  EXTREMITIES:  No pedal edema.  CNS:  Alert and fairly oriented.   LABORATORY:  Today, white blood cells 4.9, hemoglobin 9.8, and  hematocrit is 27.4, and platelets 216, and the chemistry, sodium is 132,  potassium is 4.25, and chloride is 106, bicarb 18, and glucose is 88,  BUN is 49, creatinine is 7.3.  Calcium is 8.   PLAN AT DISCHARGE:  He will be discharged today to have his dialysis  with Dr. Kristian Covey and the hospital with his PMD to come 2-3 days after  evaluation by M.D.  Marcello Moores, MD  Electronically Signed     MT/MEDQ  D:  10/11/2007  T:  10/11/2007  Job:  [id number]
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
## 2011-05-03 NOTE — Discharge Summary (Signed)
NAME:  BRACKEN, MOFFA NO.:  [id number]   MEDICAL RECORD NO.:  [id number]          PATIENT TYPE:  INP   LOCATION:  4734                         FACILITY:  [facility]   PHYSICIAN:  Maple Mirza, PA   DATE OF BIRTH:  1976-09-08   DATE OF ADMISSION:  01/19/2009  DATE OF DISCHARGE:  01/21/2009                               DISCHARGE SUMMARY   ADDENDUM    This addendum concerns the patient's allergies and intolerances, it was  listed on the original discharge on January 21, 2009, as none; however,  he has allergies and intolerances to IBUPROFEN, NAPROSYN, LITHIUM,  TYLENOL, FENTANYL, METHADONE, and DILAUDID.   He has follow up appointments on Monday, January 26, 2009, at [place], [place] office at 9:30 to see Dr. Clifton James and he will see  Dr. Antoine Poche on Friday, February 20, 2009, at 10:30 for an echocardiogram  and then to see Dr. Antoine Poche at 11:30 on Friday, February 20, 2009.      Maple Mirza, PA     GM/MEDQ  D:  01/21/2009  T:  01/21/2009  Job:  [id number]   cc:   Rollene Rotunda, MD, [facility]  Jorja Loa, M.D.  Lia Hopping  [facility]

## 2011-05-03 NOTE — H&P (Signed)
[name], [name]               ACCOUNT NO.:  [id number]   MEDICAL RECORD NO.:  [id number]          PATIENT TYPE:  INP   LOCATION:  A201                          FACILITY:  [facility]   PHYSICIAN:  [name] Shipper, MD     DATE OF BIRTH:  Aug 13, 1976   DATE OF ADMISSION:  10/13/2008  DATE OF DISCHARGE:  LH                              HISTORY & PHYSICAL   PRIORITY ADMISSION HISTORY AND PHYSICAL   PRIMARY CARE PHYSICIAN:  [name] Sartorius, MD, in [place], [place].   NEPHROLOGIST:  [name] Loa, MD.   ADMITTING DIAGNOSES:  1. Abdominal pain, unclear etiology.  2. Elevated lipase in the setting of renal failure.  3. Severe hyponatremia likely a result of dDAVP.  4. History of end-stage renal disease.  5. History of hypertension.  6. History of bipolar disorder.   CHIEF COMPLAINT:  I need help with medications.   HISTORY OF PRESENT ILLNESS:  The patient is a 35 year old Caucasian  male, who has a past medical history as stated above, who presented to  the hospital with a request for detox.  He says that he has been taking  his Lortabs more than 10 tablets every day.  However, he said that he  actually took only 1 pill in the last 1 week.  He was seen by the  [facility] and they felt that he was not a candidate for  rehab.  However upon further questioning, it became apparent the patient  was having abdominal pain.  The patient has been having this symptom for  the last 3 to 4 days.  The pain is located in the lower abdomen, a sharp  pain, a 10 out of 10 in intensity, continuous, associated with nausea  and vomiting, which he has done numerous times.  Denies any blood in the  emesis.  No history of diarrhea.  He did have a fever of 100.2 and  chills.  He denied any urinary complaints.  The pain does not have any  aggravating or relieving factors, and no precipitant factors identified.   His last BM was 5 days ago.  He does admit to having weight loss, but he  is unable  to quantify.   MEDICATIONS AT HOME:  He is on the following:  1. Voltaren 75 mg b.i.d.  2. Cymbalta 60 mg daily.  3. PhosLo 667 p.o. t.i.d.  4. Desmopressin 0.2 mg, unknown # tablets at bedtime.  5. Lortab 7.5/500 as needed.  6. Requip 1 mg at bedtime.  7. Omeprazole 20 mg daily.  8. Phenergan 25 mg every 6 to 8 hours as needed.  9. Zyprexa 10 mg b.i.d.  10.Labetalol 300 mg b.i.d.  11.BuSpar-ER 4 mg b.i.d.  12.Paxil 40 mg daily.  13.Amlodipine 10 mg daily.  14.Robaxin 750 mg 4 times daily as needed.   ALLERGIES:  Include:  1. ASPIRIN.  2. METHADONE.  3. TYLENOL.  4. FENTANYL.  5. DILAUDID.   He can tolerate morphine.   PAST MEDICAL HISTORY:  Consists of:  1. 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  2. End-stage renal disease on dialysis.  His dialysis days are      Tuesday, Thursday, and Saturday.  3. Hypertension.  4. Mild mental retardation.  5. Chronic pain syndrome on narcotics.  6. History of CHF.   PAST SURGICAL HISTORY:  Includes a fistula placement in the left upper  arm.   SOCIAL HISTORY:  Lives in [place] with a sister, who is also quite sick,  and he takes care of her.  He smokes 1 pack of cigarettes on a daily  basis.  No alcohol use and no illicit drug use.   FAMILY HISTORY:  Positive for lung cancer in the father, unknown stomach  cancer in mother, fibromyalgia, depression.   REVIEW OF SYSTEMS:  GENERAL REVIEW OF SYSTEMS:  Positive for weakness  and malaise.  HEENT:  Unremarkable.  CARDIOVASCULAR:  Unremarkable.  RESPIRATORY:  Unremarkable.  GI:  As in HPI.  GU:  Unremarkable.  Other  systems unremarkable.   PHYSICAL EXAMINATION:  VITAL SIGNS:  Temperature 97.6, blood pressure  173/107, heart rate 84, respiratory rate 16, saturation 99% on room air.  GENERAL:  This is an overweight white male in no distress.  HEENT:  There is no pallor and no icterus.  Oral mucous membrane is  moist.  No oral lesions are noted.  NECK:  Soft and supple, no  thyromegaly is appreciated.  LUNGS:  A few crackles at the bases, but otherwise clear to  auscultation.  CARDIOVASCULAR:  S1 S2 is normal, regular, and no murmurs appreciated.  No S3 S4, no rubs, no bruits.  ABDOMEN:  Soft, tenderness in the lower quadrants is present, but none  in the upper quadrants.  No masses or organomegaly is appreciated.  Bowel sounds are present and normal.  No other abnormality is present.  GU:  Examination was deferred.  NEUROLOGICAL:  He is alert and oriented x3, and no focal neurological  deficits are present.   LABORATORY DATA:  He has a hemoglobin of 11.3, normal white count,  platelet count is 201.  PT-INR, PTT is normal.  Sodium is 121, chloride  is 86, glucose is 102, BUN is 31, creatinine is 8.49.  Lipase is 129,  and LFTs are normal.  Acetaminophen salicylate level normal.  Urine drug  screen positive for benzos and opiates.  Alcohol level less than 5.  UA  showed small blood and some protein, and otherwise negative.  He had a  CT scan of his abdomen and pelvis, which revealed cardiomegaly,  bibasilar opacities left greater than right, atelectasis versus  infiltrates also noted, atrophic kidneys noted; otherwise, a tiny amount  of free fluid in the pelvis is noted.   ASSESSMENT:  This is a 35 year old Caucasian male, who presents with  abdominal pain.  Etiology for this pain is not clear.  He does have  elevated lipase, but he has end-stage renal disease and so the lipase  could be elevated because of that.  His pain is located in the lower  quadrants.  Computerized tomography (CT) failed to reveal any obvious  cause for this pain.  He has a possible infiltrate in his lungs.  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   PLAN:  1. Abdominal pain.  We will consult GI.  We will put him on a PPI and      repeat his LFTs and lipase and amylase in the morning.  I will keep  him NPO for now.  2. Possible infiltrates in the lung.  I will start him on Levaquin.  3. Hyponatremia.  Hold his desmopressin and give him 1 L of normal      saline over the next 10 hours.  4. Follow up on his labs.  5. Anemia, stable.  6. End-stage renal disease.  Consult Dr. Kristian Covey for dialysis.  He      gets dialyzed Tuesday, Thursday, and Saturday.  7. Tobacco abuse.  Put him on a nicotine patch.  8. History of hypertension.  Continue with his antihypertensive      agents.  9. History of depression.  Continue with his psychotropic medications      as well.   Further management decisions will depend on results of further testing  and the patient's response to treatment.      [name] Shipper, MD  Electronically Signed     GK/MEDQ  D:  10/13/2008  T:  10/14/2008  Job:  [id number]   cc:   Lia Hopping  Fax: [phone]   [name], M.D.  Fax: [phone]

## 2011-05-03 NOTE — Consult Note (Signed)
[name], [name] NO.:  [id number]   MEDICAL RECORD NO.:  [id number]          PATIENT TYPE:  INP   LOCATION:  6712                         FACILITY:  [facility]   PHYSICIAN:  Antonietta Breach, M.D.  DATE OF BIRTH:  May 29, 1976   DATE OF CONSULTATION:  02/09/2009  DATE OF DISCHARGE:  02/12/2009                                 CONSULTATION   REASON FOR CONSULTATION:  Depression.   REQUESTING PHYSICIAN:  Acey Lav, MD   HISTORY OF PRESENT ILLNESS:  [name] is a 35 year old male  admitted to the [facility] on February 05, 2009 due to fluid  overload.   [name] has been grieving the death of a nephew in the fall.  He has  expressed paranoia and he has homicidal ideation regarding his family at  home conspiring to kill him.  As mentioned, [name] as been grieving,  he also continues with depressed and irritable mood.   He does have intact orientation as well as memory function.   Besides the passing away of his nephew, there are no other known  precipitating stresses.  His delusions have not responded to  psychosocial efforts.   PAST PSYCHIATRIC HISTORY:  [name] does have a history of being on  lithium in the past as well as Depakote.  He was most stable on Zyprexa  and Paxil.   FAMILY PSYCHIATRIC HISTORY:  None known.   SOCIAL HISTORY:  [name] is divorced.  Occupation unemployed.  He is  medically disabled.  He is not currently using any alcohol or illegal  drugs.   PAST MEDICAL HISTORY:  Fluid overload.   His EKG QTC on February 07, 2009 was 472, on February 06, 2009 it was  568.   MENTAL STATUS EXAM:  [name] is alert.  His eye contact is  intermittent.  His attention span is slightly decreased, concentration  slightly decreased.  His affect is anxious, mood is anxious.  He is  oriented to all spheres.  His memory is intact to immediate recent and  remote.  His fund of knowledge and intelligence are normal.  His speech  is slightly pressured.  There is no dysarthria.  Thought process is  coherent.  Thought content please see the history of present illness.  His insight is poor, his judgment is impaired.   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   293.83 mood disorder not otherwise specified, depressed.     AXIS II:  Deferred.   AXIS III:  See past medical history as well as the general medical  attending record.   AXIS IV:  Bereavement general medical.   AXIS V:  15.   [name] has severe impaired judgment due to his psychosis.  He would  be at risk for lethal self-neglect.  Also, he could be at risk for  harming others.   __________ versus the [place] needs to be addressed.   RECOMMENDATIONS:  Would admit to an inpatient psychiatric unit as soon  as possible for further evaluation and  treatment.   Would ask Cardiology if they approve Zyprexa.  Zyprexa is preferred by  the psychiatric community when there is concern about QTC risk.   If Cardiology approves would restart Zyprexa at 5 mg p.o. q. 1800 for  antipsychosis as well as acute mood stabilization.   Would recheck an EKG QTC and would monitor for stiffness or other  extrapyramidal side effects.      Antonietta Breach, M.D.  Electronically Signed     JW/MEDQ  D:  05/10/2009  T:  05/11/2009  Job:  [id number]

## 2011-05-03 NOTE — Consult Note (Signed)
NAME:  [name], [name] NO.:  [id number]   MEDICAL RECORD NO.:  [id number]          PATIENT TYPE:  INP   LOCATION:  6733                         FACILITY:  [facility]   PHYSICIAN:  Verne Carrow, MDDATE OF BIRTH:  [birth year]   DATE OF CONSULTATION:  02/05/2009  DATE OF DISCHARGE:                                 CONSULTATION   PRIMARY CARDIOLOGIST:  Rollene Rotunda, MD, [facility], although the patient  lives in [place] and likely should follow up there.   PRIMARY CARE Yahsir Wickens:  Previously Dr. Dorena Dew apparently fired from  that practice.   NEPHROLOGIST:  Jorja Loa, MD   PATIENT PROFILE:  This is a 35 year old Caucasian male with a history of  CAD status post non-ST-elevation MI and bare-metal stenting of the  circumflex and RCA on January 07, 2009, who has been readmitted by the  Teaching Service secondary to volume overload in the setting of missing  dialysis.   PROBLEMS:  1. Acute volume overload.  2. Hypertensive urgency.  3. End-stage renal disease on Tuesday, Thursday, Saturday dialysis.  4. Medication and dialysis noncompliance.  5. Coronary artery disease.      a.     In January 2010, non-ST-elevation myocardial infarction.      b.     On January 05, 2009, cardiac catheterization, left main       normal, left anterior descending 40% proximal, left circumflex 95%       mid, 50% distal, obtuse marginal-1 40-50%, right coronary artery       40% proximal, 95% mid, 30% distal.  Ejection fraction 45% with       inferior hypokinesis.      c.     On January 07, 2009, percutaneous coronary intervention of       the left circumflex with placement of 4.0 x 18 mm Vision bare-       metal stent and a 3.0 x 28 mm Vision bare-metal stent.  There is       also percutaneous transluminal coronary angioplasty of the       inferior branch of the obtuse marginal.  Percutaneous coronary       intervention and stenting of the right coronary artery with a 3.0   x 15 mm Vision bare-metal stent.  6. 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      a.     Normal left ventricular function by echo on January 07, 2009.      b.     Echocardiogram on January 19, 2009, ejection fraction of 25-       35% with severe diffuse left ventricular hypokinesis.  7. Hyperlipidemia.  8. Ongoing tobacco abuse, 20-pack-year history, currently smoking      about half pack a day.  9. Bipolar disorder.  10.Anemia.  11.Chronic pain.  12.Narcotic dependency.   HISTORY OF PRESENT ILLNESS:  This is a 35 year old Caucasian male with a  history of CAD status post non-ST-elevation MI and bare-metal stenting  of the circumflex and RCA on January 07, 2009.  Since then, the patient  was  admitted secondary to volume overload requiring hemodialysis.  EF  which was normal on January 07, 2009, was found to be 25-35% on January 19, 2009.  The patient has been apparently abusing Lorcet and Xanax  according to his mother and also missing hemodialysis earlier this week.  Today, he says he awoke acutely short of breath and orthopneic.  There  was no chest pain.  He also had a nonproductive cough.  He was taken to  the [facility] ED where chest x-ray showed pulmonary edema as well as a  right lower lobe infiltrate.  He was taken fairly urgently to  hemodialysis.  He is being admitted by the Teaching Service.  Currently,  he is breathing much better and continues to deny chest pain.  He also  reports compliance with medications.  Denies abusing Lorcet and Xanax  and also says he has not missed any dialysis this week.   ALLERGIES:  IBUPROFEN, NAPROSYN, LITHIUM, TYLENOL, FENTANYL, METHADONE,  DILAUDID.   HOME MEDICATIONS:  1. Lisinopril 5 mg daily.  2. Lopressor 50 mg b.i.d.  3. Crestor 20 mg daily.  4. Aspirin 325 mg daily.  5. Plavix 75 mg daily.  6. Norvasc 10 mg daily.  7. Cymbalta 60 mg daily.  8. Lorcet 10/650 mg q.i.d.  9. Imdur 30 mg daily.  10.ReQuip 2  mg daily.  11.Venofer 25 mg every Thursday.  12.Renagel t.i.d.  13.Aranesp 150 mcg every  Tuesday.  14.Zemplar 2.5 mcg every Tuesday.  15.Zyprexa 10 mg b.i.d.  16.The patient is on Paxil, which he has been weaning off secondary to      prolonged QTc.   FAMILY HISTORY:  Mother is alive and well at age 79.  Father died of an  MI at 70.  He has no siblings.   SOCIAL HISTORY:  Lives in [place] with his sister, disabled.  He has  a 20-pack-year history of tobacco use, currently smoking half pack a  day.  Per mother's report, he used 33 Lorcet over the span of 4 days  ending February 03, 2009.   REVIEW OF SYSTEMS:  Positive for dyspnea, orthopnea, PND, cough,  wheezing.  He has a flattened affect and mood disturbance; otherwise,  all systems reviewed and negative.  He is a full code.   PHYSICAL EXAMINATION:  VITAL SIGNS:  He is afebrile, heart rate 80,  respirations 20, blood pressure 153/89, pulse ox 95% on room air.  GENERAL:  Pleasant, white male, in no acute distress, awake and alert  x3.  PSYCH:  Flattened affect.  NEURO:  Grossly intact, nonfocal.  HEENT:  Normal.  SKIN:  Warm and dry without lesions or masses.  MUSCULOSKELETAL:  Grossly normal without deformity or effusion.  NECK:  With elevated JVP to the jaw.  LUNGS:  Respirations are regular and labored with crackles at the bases  with occasional expiratory wheeze.  CARDIAC:  Regular S1 and S2.  No S3, S4, murmurs.  ABDOMEN:  Round, soft, nontender, nondistended.  Bowel sounds present  x4.  EXTREMITIES:  Warm, dry, and pink.  Trace bilateral lower extremity  edema.  Dorsalis pedis, posterior tibialis pulses are 2+ and equal  bilaterally.   Chest x-ray shows cardiac enlargement, pulmonary edema, right lower lobe  infiltrate.  EKG shows sinus tach at rate of 117 and normal axis and  nonspecific T-wave changes in lateral leads.   LABORATORY WORK:  Hemoglobin 11.4, hematocrit 33.0, WBC 0.6, platelets  325.  Sodium 135,  potassium 4.7,  chloride 99, CO2 of 19, BUN was 32,  creatinine 9.19.  CK-MB less than 1.0, troponin I less than 0.5.  Calcium 8.9.  Blood cultures are pending.   ASSESSMENT AND PLAN:  1. Acute volume overload/dyspnea in the setting of missing dialysis      this week.  He is undergoing hemodialysis per Nephrology currently.      He has also been written for Avelox secondary to right lower lobe      infiltrate.  2. Mixed ischemic, nonischemic cardiomyopathy, ejection fraction 25-      35% with global hypokinesis by echo January 19, 2009.  Ejection      fraction was normal by echo on January 07, 2009, following      percutaneous coronary intervention.  Continue ACE and beta-blocker      and consider changing to Coreg.  Followup echo in 8-12 weeks on      maximal medical therapy, although certainly have to question if the      patient will be compliant.  Could consider EP eval if EF remains      low, although he is likely a poor ICD candidate.  3. Hypertensive urgency.  Resume home medicine, his current and HD.  4. Coronary artery disease, no chest pain.  Follow enzymes.  Continue      aspirin, Plavix, beta-blocker, and nitrate.  5. Bipolar disorder per primary team.  6. Tobacco abuse, smoking cessation strongly advised.  7. Hyperlipidemia.  Continue Crestor therapy.  8. End-stage renal disease, hemodialysis per Nephrology.  9. Narcotic dependence.  Consider social work evaluation.      Nicolasa Ducking, ANP      Verne Carrow, MD  Electronically Signed    CB/MEDQ  D:  02/05/2009  T:  02/06/2009  Job:  [id number]

## 2011-05-03 NOTE — H&P (Signed)
NAMEGLENDON, DUNWOODY NO.:  [id number]   MEDICAL RECORD NO.:  [id number]          PATIENT TYPE:  INP   LOCATION:  2926                         FACILITY:  [facility]   PHYSICIAN:  Wendi Snipes, MD DATE OF BIRTH:  01-Feb-1976   DATE OF ADMISSION:  01/19/2009  DATE OF DISCHARGE:                              HISTORY & PHYSICAL   His cardiologist is Dr. Antoine Poche.  His PCP is Dr. Leone Payor.  His  nephrologist is Dr. Kristian Covey.   CHIEF COMPLAINT:  Shortness of breath.   HISTORY OF PRESENT ILLNESS:  This is a 35 year old male with a history  of end-stage renal disease on hemodialysis with coronary artery disease  status post ST elevation MI on January 18 who presents with 2 days of  shortness of breath.  He was recently discharged from [facility] on  January 08, 2009, after presenting with an inferior ST elevation MI.  He  received stents to the RCA and the circumflex at that time and did well  as an outpatient.  However, he now reports progressive shortness of  breath and paroxysmal nocturnal dyspnea for 2 days.  He denies any chest  pains or other cardiac complaints.  He does report that he has been  noncompliant on this low-salt diet.  His last hemodialysis was on  January 29, and he is next due on February 2.   PAST MEDICAL HISTORY:  1. Coronary disease status post ST elevation MI on January 18      requiring bare-mental stents overlapping to the mid circumflex and      1 bare-mental stent to the large right coronary artery.  2. End-stage renal disease on hemodialysis Tuesday, Thursday and      Saturday.  3. Hypertension.  4. Bipolar disorder.  5. Chronic pain disorder.  6. Congestive heart failure with ejection fraction of 60% on January 07, 2009.   ALLERGIES:  No known drug allergies.   MEDICATIONS:  1. Metoprolol 25 mg twice daily.  2. Crestor 20 mg daily.  3. Aspirin 325 mg daily.  4. Plavix 75 mg daily.  5. Norvasc 10 mg daily.  6. Paxil 40 mg  daily.  7. Loxitane 60 mg daily.  8. Nitroglycerin as needed.  9. Protonix 40 mg as needed.  10.Tylenol as needed.   SOCIAL HISTORY:  He lives in [place] with his mother.  He has an  approximately 20 pack-year history of smoking.   FAMILY HISTORY:  His father died at 72 of myocardial infarction.   REVIEW OF SYSTEMS:  All 14 systems were reviewed and were negative  except as mentioned detail in the HPI.   PHYSICAL EXAMINATION:  His blood pressure is 171/117.  His respiratory  rate is 18.  Pulse is 113, saturating 92% on nasal cannula.  HEENT:  Moist mucous membranes.  Pupils equal, round, and react to light  and accommodation.  Anicteric sclera.  NECK:  No jugular venous distention.  No thyromegaly.  CARDIOVASCULAR:  Tachycardia.  No murmurs, rubs or gallops.  LUNGS:  He had bilateral wet crackles 2/3  of the way up his lung fields.  ABDOMEN:  Nontender, nondistended.  Positive bowel sounds.  No masses.  EXTREMITIES:  No clubbing, cyanosis or edema.  NEUROLOGIC:  Alert and oriented x3.  Cranial nerves II-XII are grossly  intact.  No focal neurologic deficit.  SKIN:  Warm, dry, intact.  No rashes.  PSYCH:  Mood and affect are appropriate.   RADIOLOGY:  Chest x-ray shows vascular congestion without overt  pulmonary edema.  EKG shows a QTC of 597 with nonspecific ST- and T-wave  abnormalities that have improved laterally.   LABORATORY REVIEW:  CBC is 14.9.  His hematocrit is 32.3.  His platelets  are 433.  His potassium is 3.8.  His creatinine is 9.5.  His BNP is  greater than 3200.  His troponin is undetectable.   ASSESSMENT:  This is a 35 year old male with on hemodialysis with  coronary artery disease with recent stenting, now presents with evidence  of congestive heart failure.   1. Acute diastolic congestive heart failure.  This patient is volume      overloaded in the context of hypertensive urgency.  His current      complaints are likely a combination of both  etiologies.  He will      need dialysis and will consult renal for that.  Will continue his      current medications, including the nitroglycerin drip for his blood      pressure and afterload reduction in the context of congestive heart      failure.  2. Leukocytosis.  It is unclear if this is the result of an infectious      process.  However, this is likely a stress response to his current      acute illness.  Will consider empiric antibiotics and check urine      cultures and blood cultures.      Wendi Snipes, MD  Electronically Signed     BHH/MEDQ  D:  01/19/2009  T:  01/19/2009  Job:  [id number]

## 2011-05-03 NOTE — Discharge Summary (Signed)
NAME:  [name], [name] NO.:  [id number]   MEDICAL RECORD NO.:  [id number]          PATIENT TYPE:  IPS   LOCATION:  0307                          FACILITY:  [facility]   PHYSICIAN:  Jasmine Pang, M.D. DATE OF BIRTH:  1976-01-26   DATE OF ADMISSION:  02/12/2009  DATE OF DISCHARGE:  02/16/2009                               DISCHARGE SUMMARY   IDENTIFICATION:  This is a 35 year old divorced white male who was  admitted on a voluntary basis with diagnoses of mood disorder, NOS, and  opiate abuse.  He presented to the ED with dyspnea.  He is in the end-  stage renal failure and he missed his dialysis appointment.  While in  the hospital, he was restarted on his dialysis.  He claims 3 weeks ago  that he had an MI and a stent inserted.  He reports they discontinued  his Paxil due to tachycardia.  He claims when he is on Cymbalta without  the Paxil, he gets paranoid and has hallucinations and active rationale.  He claims he took large quantities of his pain medication because he was  acting irrational, but denies suicidal ideation.  He also made some  homicidal statements regarding his brother when he was in the hospital,  but he denies any homicidal thoughts.  He feels his withdrawal from the  Paxil that made him paranoid and made him make these homicidal regarding  his family.  He agreed to come to the [facility] to get  his medication adjusted.   PAST PSYCHIATRIC HISTORY:  The patient was here in 2005.   ALCOHOL AND DRUG HISTORY:  Opiate abuse.   MEDICAL PROBLEMS:  End-stage renal disease, hypertension, coronary  artery disease, chronic low back pain, asthma.   MEDICATIONS:  See hospital course.   PHYSICAL FINDINGS:  Complete physical exam and laboratories were done in  the ED prior to admission here while he was on the medical unit.  These  were reviewed by the internist, treating him.   HOSPITAL COURSE:  Upon admission, the patient was restarted on  Cymbalta  60 mg daily, Crestor 20 mg daily, aspirin 325 mg p.o. daily, amlodipine  10 mg daily, carvedilol 6.25 mg daily, Plavix 75 mg daily, Aranesp 60 mg  IV every Thursday by dialysis staff, magnesium oxide 400 mg daily,  lisinopril 20 mg daily, Imdur 30 mg daily.  Albuterol MDI 2 puffs q.4 h.  p.r.n. shortness of breath, Nephro 237 mL p.o. t.i.d., Renagel 30 to 100  mg p.o. t.i.d., and renal vitamin 1 tablet p.o. daily.  He was also set  up for dialysis 3 days a week while in the hospital.  He was also  started on Phenergan 25 mg p.o. q.6 h. P.r.n. nausea and vomiting and  Ultram 50 mg p.o. q.6 h. p.r.n. pain.  He was also started on Zemplar  2.5 mg to be taken during hemodialysis.  In individual sessions with me,  the patient was lying in bed with positive psychomotor retardation.  Speech was soft and slow.  Eye contact was good.  He admitted having  been paranoid about his family, but not now.  He admits to an overdose  about 1 month ago.  He was at [facility] at that time.  He has no  psychiatrist or therapist.  On February 14, 2009, the patient returned  from the dialysis.  He continued to ask about why he was taken off his  Paxil.  He was stated that because the Paxil was stopped, roughly he got  messed up.  He denies suicidal or homicidal ideation.  He was to be  discharged soon.  On February 16, 2009, mental status had improved markedly  from admission status.  Sleep was good.  Appetite was good.  Mood was  euthymic.  Affect consistent with mood.  There was no suicidal or  homicidal ideation.  No thoughts of self-injurious behavior.  No  auditory or visual hallucinations.  No paranoia or delusions.  Thoughts  were logical and goal-directed.  Thought content no predominant theme.  Cognitive was grossly intact.  Insight fair.  Judgment fair.  Impulse  control good.  He wanted discharge today.  It was felt safe to go home.  He is going to live with his mother.   DISCHARGE  DIAGNOSES:  Axis I:  Mood disorder, not otherwise specified,  also opiate abuse.  Axis II:  None.  Axis III:  End-stage renal disease, hypertension, coronary artery  disease, chronic low back pain, asthma.  Axis IV:  Severe (problems with primary support group, other  psychosocial problems, burden of psychiatric illness, and chemical  dependence use, burden of medical problems).  Axis V:  Global assessment of functioning was 50 upon discharge.  GAF  was 35 upon admission.  GAF highest past year was 60 to 65.   DISCHARGE PLANS:  There was no specific activity level or dietary  restrictions.   POSTHOSPITAL CARE PLANS:  The patient will go to [facility] on  February 18, 2009, at 8 a.m.  He has an appointment with the Dr. Corinda Gubler,  Cardiology on February 20, 2009.   DISCHARGE MEDICATIONS:  No new medicines were added.  He is to continue  on:  1. Cymbalta 60 mg daily.  2. He is to continue his other medical medicines including aspirin 325      mg daily.  3. Amlodipine 10 mg daily.  4. Carvedilol 6.25 mg daily.  5. Plavix 75 mg daily.  6. Aranesp 60 mg IV every Thursday by dialysis staff.  7. Magnesium oxide 400 mg p.o. daily.  8. Lisinopril 20 mg daily.  9. Imdur 30 mg daily.  10.Albuterol MDI 2 puffs q.4 h. p.r.n. shortness of breath.  11.Nephro 237 mL p.o. t.i.d.  12.Renagel 30 to 100 mg p.o. t.i.d.  13.Renal vitamin 1 tablet p.o. daily.  14.Cymbalta 60 mg daily.  15.Crestor 20 mg daily.      Jasmine Pang, M.D.  Electronically Signed     BHS/MEDQ  D:  02/16/2009  T:  02/17/2009  Job:  [id number]

## 2011-05-03 NOTE — Consult Note (Signed)
[name], [name]               ACCOUNT NO.:  [id number]   MEDICAL RECORD NO.:  [id number]          PATIENT TYPE:  INP   LOCATION:  IC07                          FACILITY:  [facility]   PHYSICIAN:  Jorja Loa, M.D.DATE OF BIRTH:  06/15/1976   DATE OF CONSULTATION:  DATE OF DISCHARGE:                                 CONSULTATION   REASON FOR CONSULTATION:  Chronic renal failure.   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  According to the patient, he  has refused his Xanax about a day and when he was in his mother's home,  somebody took the Xanax.  However, according to the note, his sister  states that he took Xanax 29 tablets.  The patient at this moment denies  that he has taken except 1 in the morning and 1 in the evening, and in  fact he states that we can check the level of the medications that he  did not take more than what he was supposed to take.  Hence, he denies  the story of being overdosed.  Presently, the patient does not have any  complaints, and in fact he is alert and states that he is supposed to go  to Dr. Olena Leatherwood tomorrow for his PPD to be read.  In fact, he would like  to go home today.  He does not have any nausea or vomiting.  Appetite is  reasonable.   PAST MEDICAL HISTORY:  1. History of bipolar disorder.  2. History of Osgood-Schlatter syndrome.  3. 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  4. History of depression.  5. History of chronic pain syndrome on multiple prescription      medications.  6. History of chronic renal failure reaching end-stage.   MEDICATIONS:  1. Norvasc 10 mg p.o. daily.  2. PhosLo 2 tablets p.o. t.i.d.  3. Lasix 40 mg p.o. b.i.d.  4. Avapro 150 mg p.o. daily.  5. Normodyne 400 mg p.o. daily.  6. Protonix 40  mg p.o. daily.  7. He is getting IV fluids at 60 mL per hour.  8. Xanax 0.25 mg p.o. b.i.d.  9. Paroxetine 40 mg p.o. daily as an outpatient.  10.Desmopressin 0.5 mg p.o. daily.  11.Ventolin inhaler.  12.Sodium bicarbonate 650 twice a day.   SOCIAL HISTORY:  The patient denies any alcohol abuse and no illicit  drug use, but he has overall prescription drug seeking behavior.  Especially, his family is always calling us to order for him some pain  medication.  It seems that they seem to be the ones who seem to be  concerned about his pain.   REVIEW OF SYSTEMS:  At this moment he does not have any nausea,  vomiting, or chest pain.  No urgency, frequency.  No shortness of  breath, dizziness, or lightheadedness.   PHYSICAL EXAMINATION:  VITAL SIGNS:  Temperature is 97.9, blood pressure  is 145/89.  CHEST:  Clear to auscultation.  HEART:  Regular rate and rhythm.  No murmur.  ABDOMEN:  Soft, positive bowel sounds.  EXTREMITIES:  No edema.   LABORATORY DATA:  pH is 7.08, PCO2 of 29.  White blood cell count is  4.9, hemoglobin 9.8, hematocrit 27.4, platelets of 216.  Sodium 152,  potassium 4.5, BUN is 49, creatinine 7.34.  Calculated GFR is 10.  Creatinine when he came was 7.91, seems to be improving.   ASSESSMENT:  1. Renal insufficiency, stage 5.  At this moment he does not have any      uremic signs and symptoms.  His is supposed to be started on      hemodialysis.  He had purified protein derivative which is not      read.  Presently still, his potassium is good, and he has no sign      of fluid overload.  2. Anemia secondary to chronic renal failure.  He is on erythropoietin      as an outpatient.  3. History of bipolar disorder.  4. History of Osgood-Schlatter syndrome.  5. 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  He is on      desmopressin as out outpatient.  6. History of hypertension.  Blood pressure seems to be controlled      very well.  7. History of Xanax  overdose.  I am not sure.  The patient denies it.      He seems to be giving a clear description of what happened, and his      story and his sister's story seems to be completely in      contradiction; however, clinically he seems to be stable.  8. History of hyperphosphatemia.  He is on PhosLo.   RECOMMENDATIONS:  We will consider starting him on dialysis as an  outpatient.  Presently clinically stable.  Probably if he is going to be  discharged today, the PPD will be read by Dr. Bartholomew Crews office.  He then  will consider starting him tomorrow.  If he is in the hospital, then we  will try to figure out whether the PPD should be read.  If that is  negative, then the patient could be started on hemodialysis.  At this  moment, there is no urgent reason to consider dialysis.  He can wait  until he is discharged and probably will consider this week or next  week.      Jorja Loa, M.D.  Electronically Signed     BB/MEDQ  D:  10/11/2007  T:  10/11/2007  Job:  [id number]

## 2011-05-03 NOTE — Group Therapy Note (Signed)
NAME:  [name], [name] NO.:  [id number]   MEDICAL RECORD NO.:  [id number]          PATIENT TYPE:  INP   LOCATION:  A201                          FACILITY:  [facility]   PHYSICIAN:  Osvaldo Shipper, MD     DATE OF BIRTH:  [birth year]   DATE OF PROCEDURE:  10/14/2008  DATE OF DISCHARGE:                                 PROGRESS NOTE   PRIMARY CARE PHYSICIAN:  Lia Hopping, [place], [place].   NEPHROLOGIST:  Jorja Loa, M.D.   SUBJECTIVE:  Today the patient is feeling better.  His abdominal pain is  improved, though he still has some pain in the lower abdomen.  He denies  any nausea, vomiting, or diarrhea.  He tolerated his dinner tonight. He  states that he continues to have excessive urination overnight.   OBJECTIVE:  VITAL SIGNS:  Temperature is 97, heart rate 95, respiratory  rate is 20, blood pressure 172/108.  It has been consistently high all  day long.  Saturation 98% on room air.  GENERAL:  Well-developed, well-nourished individual in no distress.  HEENT:  Unremarkable.  CARDIOVASCULAR:  S1/S2 is normal, regular.  No murmurs appreciated.  RESPIRATORY:  Clear to auscultation bilaterally.  ABDOMEN:  Soft.  There is minimal tenderness in the lower quadrants.  No  rebound, rigidity, or guarding.  No masses or organomegaly is  appreciated.  Bowel sounds are present, normal.  EXTREMITIES:  Show no edema.  NEUROLOGIC:  He is oriented and no focal deficits are present.   LABORATORY DATA:  His CBC is stable.  His sodium is improved to 126.  The rest of the parameters are stable.  His lipase is down to 25 today.   ASSESSMENT/PLAN:  1. Abdominal pain, etiology unclear.  GI has seen him and they feel      this could be gastroenteritis because he was having diarrhea      earlier.  This information was not really available to me      yesterday.  He denied any diarrhea.  In any case CT did not reveal      any acute abnormalities.  His lipase has come down.  So GI  will see      him tomorrow morning once again.  We will switch him over to oral      Lorcet and discontinue the morphine and see how he does.  2. Uncontrolled hypertension.  We will start him on hydralazine and      continue him on Norvasc and labetalol.  3. Possible infiltrates of the lung.  Continue with Levaquin.  Will      change it to p.o.  4. Hyponatremia is improving.  DDAVP is being held.  He needs to      discuss with Dr. Kristian Covey regarding the dosage of DDAVP and when he      can resume it.  5. Anemia, stable.  6. End-stage renal disease on dialysis.  He was dialyzed today with      Dr. Kristian Covey following him.  7. History of tobacco abuse on nicotine patch.  8. History of  depression, stable.   The patient has shown some improvement.  There is a good possibility he  may be able to go home in the next day or two.      Osvaldo Shipper, MD  Electronically Signed     GK/MEDQ  D:  10/14/2008  T:  10/15/2008  Job:  [id number]

## 2011-05-03 NOTE — Assessment & Plan Note (Signed)
[facility]                            CARDIOLOGY OFFICE NOTE   NAME:[name], [name]                      MRN:          [id number]  DATE:01/26/2009                            DOB:          09/03/1976    PRIMARY CARDIOLOGIST:  [name] Rotunda, MD.   PRIMARY NEPHROLOGIST:  [name] Loa, MD.   REASON FOR VISIT:  Hospital followup.   HISTORY OF PRESENT ILLNESS:  [name] is a 35 year old Caucasian male  with a past medical history significant for hypertension, end-stage  renal disease on hemodialysis, bipolar disorder, schizophrenia, and  chronic pain syndrome who was admitted to [facility] on December 26, 2008, with complaints of chest pain and was found to have a non-ST-  elevation myocardial infarction.  The patient had bare-metal stents  placed in the mid circumflex artery and the right coronary artery during  that hospitalization.  He was discharged to home on January 08, 2009, in  stable condition.  He was then readmitted to [facility] on  January 19, 2009, with volume overload felt to be secondary to acute on  chronic diastolic congestive heart failure.  During that admission, he  was aggressively dialyzed and had his weight reduced by 15 pounds over 3  days.  The patient was stable for discharge on January 21, 2009.  The  patient has been followed by [name] in the hospital.  It is  unclear to me why he was scheduled in my clinic today.  Plans have been  made for him to see [name] who has assumed care with his primary  cardiologist.  His followup appointment with [name] is on February 20, 2009.   The patient tells me today he has been doing well since discharge from  the hospital.  He has occasional shortness of breath and tells me this  generally resolves when he completes dialysis.  He is scheduled to have  dialysis tomorrow.  He has had no chest pain since his cardiac  catheterization and stent  placement.  He tells me he has been taking all  of his medications as prescribed.  Currently, denies any chest pain,  shortness of breath, palpitations, dizziness, near syncope, syncope,  orthopnea, PND, or lower extremity edema.   PAST MEDICAL HISTORY:  1. Coronary artery disease, status post non-ST elevation myocardial      infarction January 05, 2009, with left heart catheterization      performed on January 05, 2009.  Percutaneous coronary intervention      was performed on January 07, 2009, with placement of 2 bare-metal      stents in the mid circumflex artery and 1 bare-metal stent in the      mid right coronary artery.  2. End-stage renal disease, on hemodialysis.  3. Hypertension.  4. Bipolar disorder.  5. Schizophrenia.  6. Chronic pain syndrome.  7. Left ventricular systolic dysfunction.  8. Ongoing tobacco abuse.   PAST SURGICAL HISTORY:  Placement of an AV fistula in the left arm.   ALLERGIES:  IBUPROFEN, NAPROSYN, LITHIUM, TYLENOL, FENTANYL, METHADONE,  DILAUDID.   CURRENT MEDICATIONS:  1. Metoprolol 50 mg twice daily.  2. Crestor 20 mg once daily.  3. Aspirin 325 mg once daily.  4. Plavix 75 mg once daily.  5. Norvasc 10 mg once daily.  6. Paxil 40 mg once daily.  7. Cymbalta 60 mg once daily.  8. Lorcet 10/650 mg 4 tablets daily.  9. Imdur 30 mg once daily.  10.ReQuip 2 mg once daily.  11.Venofer 25 mg IV on Thursdays.  12.Renagel 30/200 mg 3 times daily.  13.Aranesp 150 mcg IV on Tuesdays.  14.Zemplar 2.5 mcg IV on Tuesdays.  15.Zyprexa 10 mg twice daily.   SOCIAL HISTORY:  The patient lives in [place] with his mother.  He  has smoked 1 pack of cigarettes per day for 20 years and continues to  smoke.  He denies use of alcohol or illicit drugs.  He is divorced and  has no children.  He is currently disabled.   FAMILY HISTORY:  The patient's father died at age 76 from bone cancer.  His mother is alive and has fibromyalgia and multiple sclerosis.    REVIEW OF SYSTEMS:  As stated above is otherwise negative.   PHYSICAL EXAMINATION:  VITALS:  Blood pressure 148/96, pulse 62 and  regular, respirations 12 and unlabored.  GENERAL:  He is a pleasant young Caucasian male who is in no acute  distress.  The patient has a flat affect.  He is alert and oriented x3.  MUSCULOSKELETAL:  Muscle strength and tone is normal.  NEUROLOGICAL:  No focal neurological deficits.  SKIN:  Warm and dry.  HEENT:  Normal.  NECK:  No JVD.  No carotid bruits.  No thyromegaly.  No lymphadenopathy.  LUNGS:  Clear to auscultation bilaterally without wheezes, rhonchi, or  crackles noted.  CARDIOVASCULAR:  Regular rate and rhythm without murmurs, gallops, or  rubs noted.  ABDOMEN:  Soft, nontender.  Bowel sounds are present.  EXTREMITIES:  No evidence of edema.  Pulses are 2+ in all extremities.   DIAGNOSTIC STUDIES:  1. A 12-lead EKG obtained in our office today shows normal sinus      rhythm with a ventricular rate of 62 beats per minute.  There are      changes consistent with LVH.  There are nonspecific T-wave      abnormalities noted.  The QRS duration is 110 milliseconds.  The QT      interval was corrected at 503 milliseconds.  2. Echocardiogram performed on January 19, 2009, shows that the left      ventricle is mildly dilated with moderate reduction in systolic      function.  Ejection fraction is estimated at 25-35%.  There is      severe diffuse left ventricular hypokinesis.  Left ventricular wall      thickness was mildly increased.  Left atrium was mildly dilated.  3. Left heart catheterization performed on January 05, 2009, showed      normal left main coronary artery.  The LAD is a large vessel      wrapped to the apex and gave off the diagonal branch.  There was a      40% proximal stenosis in the LAD.  There is a 95% lesion in the mid      circumflex followed by diffuse 50% lesions distally.  There was 40-      50% lesion in the first obtuse  marginal.  The right coronary artery  was a large dominant vessel that gave off an RV branch, a PDA, and      posterolateral branch.  There is a 40% proximal stenosis and a 95%      hazy lesion in the mid section.  Left ventricular angiogram showed      an ejection fraction of 45%.  Please note the patient is now status      post intervention with placement of 2 bare-metal stents in the mid      circumflex and 1 bare-metal stent in the mid right coronary artery.   ASSESSMENT/PLAN:  This is a pleasant 35 year old Caucasian male with  multiple medical problems as described above who presents to our  Cardiology office for hospital followup.  The patient was scheduled in  my clinic today, but is followed in the hospital and to be followed as  an outpatient by [name].  In regards to his coronary artery  disease, the patient seems to be doing well.  I would like to continue  him on the aspirin, Plavix, statin, and beta-blocker currently.  I have  reviewed his medicines and see that he is not currently on an ACE  inhibitor.  We will start lisinopril 5 mg once daily.  In regards to his  left ventricular systolic dysfunction, I am hopeful that this will  improve.  [name] has arranged for followup echocardiogram to be  performed in 6 weeks at the time of his next visit with [name].  I  have also reviewed his EKG today and see that his QT interval is  prolonged.  I have reviewed his list of medications and see that he is  taking Paxil, which can prolong the QT interval.  I have discussed this  with his MD here in our clinic.  I will wean the patient off the Paxil .  He is to take 20 mg for the next 5 days and then cut back to 10 mg on a  daily basis for 5 days.  At that point, he should stop taking his Paxil.  The patient will follow up with his primary care physician for further  recommendations on antidepressant therapy that does not prolong the QT  interval.  The  patient will have a repeat EKG when he is seen back in  the office by [name] on February 20, 2009.  His blood pressure is  mildly elevated today; however, he is to get dialysis tomorrow.  I think  adding a low-dose ACE inhibitor may help with his blood pressure.  I  have counseled the patient on the importance of tobacco cessation.  I  have asked him if he would like to have pharmacological therapy to  assist in this.  He currently does not wish to have nicotine patches or  any other pharmacological therapy.  This will be followed up when the  patient is seen by [name].  The patient is scheduled to see Dr.  Antoine [name] on February 20, 2009, at which time an echocardiogram will be  completed.  The patient is aware he should continue taking all of his  medications as written and should call our office if he has any change  in his clinical status.    Verne Carrow, MD  Electronically Signed   CM/MedQ  DD: 01/26/2009  DT: 01/27/2009  Job #: [id number]

## 2011-05-03 NOTE — Consult Note (Signed)
[name], [name]               ACCOUNT NO.:  [id number]   MEDICAL RECORD NO.:  [id number]          PATIENT TYPE:  INP   LOCATION:  A201                          FACILITY:  [facility]   PHYSICIAN:  Jorja Loa, M.D.DATE OF BIRTH:  [birth year]   DATE OF CONSULTATION:  DATE OF DISCHARGE:                                 CONSULTATION   ADMITTING PHYSICIAN:  Dr. Rito Ehrlich, the hospitalist.   REASON FOR ADMISSION:  Abdominal pain and also asking for pain  medications.   HISTORY OF PRESENT ILLNESS:  [name] is a 35 year old male with  multiple past medical history including history of bipolar disorder,  history of hypertension, history of chronic pain syndrome, and also  recurrent prescription drug overdose.  Presently, came to the emergency  room basically I think for more pain medication.  The patient has been  at [facility] about two weeks ago for similar problem.  During  that time, the patient states that he has taken a couple of pain  medications, came to the hospital, and discharged home.  Presently, he  also has finished his Lortab, he had only one pill, and because of that  he decided to come to the emergency room, but when he was in the  emergency room he was having some pain, nausea and vomiting, and he was  admitted to the hospital.  At this moment, overall feeling better.  Still he has some nausea, but no vomiting.   PAST MEDICAL HISTORY:  1. He has a history of end-stage renal disease, on maintenance      hemodialysis, Tuesday, Thursday, and Saturday.  Noncompliant with      dialysis.  2. History of hypertension.  3. History of bipolar disorder.  4. History of chronic pain syndrome.   MEDICATIONS:  At this moment, he is on:  1. Ventolin inhaler p.o. q.12 h.  2. Norvasc 10 mg p.o. daily.  3. He is on PhosLo 2 tablets p.o. t.i.d.  4. Renagel 4 tablets p.o. daily.  5. He is also on labetalol 100 mg p.o. b.i.d.  6. Levaquin 500 mg IV daily.  7. Zyprexa 10 mg  p.o. b.i.d.  8. Protonix 40 mg IV daily.  9. Paxil 40 mg p.o. daily.  10.Requip 1 mg p.o. nightly.  11.He is getting IV fluid at 80 mL per hour.  12.Other medications are p.r.n. medications.   ALLERGIES:  He is allergic to ASPIRIN, IBUPROFEN, NAPROSYN, LITHIUM,  TYLENOL, __________, METHADONE, and HYDROMORPHONE.   SOCIAL HISTORY:  The patient smokes, and also mainly uses prescription  drugs.  He denies using any illicit drugs.   REVIEW OF SYSTEMS:  Some abdominal pain.  Nausea seems to be getting  better.  He denies any shortness of breath.  No chest pain.  He denies  any diarrhea, urgency, frequency, __________ fever.   PHYSICAL EXAMINATION:  VITAL SIGNS:  On examination, his temperature is  97.2, blood pressure 180/100, and pulse of 81.  CHEST:  Clear to auscultation.  HEART:  Reveals regular rate and rhythm.  No murmur.  ABDOMEN:  Soft.  Positive bowel sounds.  Nontender.  EXTREMITIES:  No edema.   LABORATORY DATA:  His white blood cell count is 5.5, hemoglobin 11.4,  hematocrit 32.3, and platelet of 189.  Sodium 126, potassium 4.7, BUN  35, and creatinine 9.02.  His lipase was 129, it came to 25.   ASSESSMENT:  1. End-stage, the patient on dialysis, most of the time compliance      recently has improved, but still occasionally missed for different      reasons.  2. Anemia, hemoglobin is 11.4, hematocrit 32.3.  He is on Epogen,      stable.  3. Hyperphosphatemia.  About two weeks ago, his phosphorous was more      than 9 because of noncompliance with his medications.  He was on      PhosLo, Renagel.  Presently, he only mentioned about his PhosLo.  4. Abdominal pain.  Not sure, whether this is because of pancreatitis      as his lipase is elevated, but has improved, but always the patient      complains of chronic pain and hence it may be a part of that.  5. Hyponatremia.  Sodium has improved previously before he was started      on dialysis.  The patient used to be on  desmopressin that has been      discontinued, but not sure whether the patient is getting the      medication, still he is taking it, and probably that might have      caused his hyponatremia.  6. History of bipolar disorder.  7. History of multiple prescription overdose, and admission to the      hospital.  8. Hypertension.  He is on labetalol.  His blood pressure seems to be      somewhat high.   RECOMMENDATIONS:  We will continue his present management, at this  moment we will discontinue his IV fluid, and agree with discontinuation  of desmopressin.  We will start him on Renagel, and we will continue his  PhosLo.      Jorja Loa, M.D.  Electronically Signed     BB/MEDQ  D:  10/14/2008  T:  10/14/2008  Job:  [id number]

## 2011-05-03 NOTE — Discharge Summary (Signed)
NAME:  [name], [name]               ACCOUNT NO.:  [id number]   MEDICAL RECORD NO.:  [id number]          PATIENT TYPE:  INP   LOCATION:  4734                         FACILITY:  [facility]   PHYSICIAN:  Rollene Rotunda, MD, FACCDATE OF BIRTH:  Dec 27, 1975   DATE OF ADMISSION:  01/19/2009  DATE OF DISCHARGE:  01/21/2009                               DISCHARGE SUMMARY   This patient has no known drug allergies.   This patient has hemodialysis at [facility] 5:30 a.m. on  Thursday, January 22, 2009.   FINAL DIAGNOSES:  1. Admitted with dyspnea/paroxysmal nocturnal dyspnea.  2. Acute-on-chronic diastolic congestive heart failure, BNP greater      than 3200.  3. End-stage renal disease, hemodialysis Tuesday, Thursday, and      Saturday.      a.     Aggressive hemodialysis at [facility] which reduced his       admission weight which was 100.5 kg to a dry weight of 93.6 kg a       loss of 15 pounds in 60 hours.   New dry weight 93.6 kg/205.9 pounds.  1. Hypertension, poor control this admission.      a.     Metoprolol was increased from 25 mg b.i.d. to 50 mg b.i.d.   SECONDARY DIAGNOSES:  1. End-stage renal disease on hemodialysis about 2 years, hemodialysis      on Tuesday, Thursday, and Saturday.  2. Ongoing tobacco habituation.  3. Bipolar disorder.  4. Chronic pain syndrome.  5. Admission with inferior ST elevations on electrocardiogram January 05, 2009.  6. Left heart catheterization January 05, 2009,  95% mid      atrioventricular circumflex lesion, 95% mid right ventricular      branch lesion.  7. Therapeutic catheterization January 07, 2009.      a.     Overlapping bare-metal stents to the left atrioventricular       circumflex.      b.     Bare-metal stent to the mid right coronary artery.  8. Echocardiogram January 07, 2009 ejection fraction 60%.  9. Echocardiogram January 19, 2009 ejection fraction 25-35%.  10.Hypertension better control stria for this admission.  11.Family history of coronary artery disease premature myocardial      infarction in the father.  12.Anemia of chronic disease.   SECONDARY DIAGNOSIS:  Echocardiogram January 19, 2009 ejection fraction  25-35%.   PROCEDURES THIS ADMISSION:  Procedures included aggressive hemodialysis  with the [facility] with loss of 15 pounds.   BRIEF HISTORY:  Mr. Deroo is a 35 year old male.  He has a history of  end-stage renal disease.  He is on hemodialysis.  He also has a history  of coronary artery disease.  He had an STEMI on January 05, 2009 with  inferior hypokinesis on echocardiogram and ejection fraction of 45%.  He  underwent stenting of both the mid right coronary artery and the AV  groove circumflex on January 07, 2009.  He went home on January 08, 2009  and did well outpatient.   The patient reports  progressive shortness of breath and paroxysmal  nocturnal dyspnea for 48-hour.  He is not complaining of chest pain.  He  apparently has had some straying from his low-salt diet.  He has  received hemodialysis on January 16, 2009 and he is now due again on  January 20, 2009.  His admission weight here is 100.5 kg.  His diagnosis  on admission with an elevated BNP is acute-on chronic diastolic  congestive heart failure.  He will have aggressive volume removal at  hemodialysis here with the [facility].  We will monitor his blood  pressure which seems to be elevated this admission and adjust  accordingly.  Cardiac enzymes have not been followed this admission.   HOSPITAL COURSE:  The patient presents short of breath, January 19, 2009.  He was seen by the [facility] in consultation.  They found  that he had a leukocytosis and recommended blood cultures.  These were  drawn on January 19, 2009 and have been negative so far x2 days.  He has  undergone hemodialysis both on January 19, 2009 and January 20, 2009  here at [facility] with as mentioned above removal of 15 pounds   of fluid.  The patient is ambulating in the halls at the time of  discharge still slightly short of breath with his ambulations, however,  his weight now a dry weight is 93.6 kg.  The patient also had an  echocardiogram on January 19, 2009.  This study showed as mentioned  above ejection fraction 25-35%.  This is quite a decreased from his  ejection fraction of 60% at echocardiogram on January 07, 2009.  The  patient will probably have a followup echocardiogram in 6 weeks to see  if he has rebound in his ejection fraction.  He also is going to have a  home health nurse assessment to evaluate whether the patient's home  medications are being taken as prescribed.   MEDICATIONS AT DISCHARGE:  1. Metoprolol 50 mg twice daily a new dose.  2. Crestor 20 mg daily.  3. Enteric-coated aspirin 325 mg daily.  4. Plavix 75 mg daily.  5. Norvasc 10 mg daily.  6. Paxil 40 mg daily.  7. Cymbalta 60 mg daily.  8. Nitroglycerin 0.4 mg sublingual every 5 minutes x3 doses as needed      for chest pain.  9. Protonix 40 mg daily as needed.  10.Lorcet Plus 10/650 taken four times a day.  11.Imdur 30 mg daily.  12.ReQuip 2 mg at bedtime.  He also has the following at hemodialysis.  13.Venofer 25 mg IV on Thursdays.  14.He takes Renagel 800 mg 4 tablets three times a day with meals.  15.Aranesp 150 mcg IV every Tuesday at hemodialysis.  16.Zemplar 2.5 mcg IV on Tuesday, Thursday and Saturday.   On the day of discharge, his complete blood count hemoglobin 9.6,  hematocrit 29.2, white cells 6.6, platelets of 340.  On admission,  stimulating blood cultures his white cell count was 14.9.  Once again  now at discharge is 6.6 and blood cultures x2 have been negative.  Serum  electrolytes on day of discharge sodium 131, potassium 3.5, chloride 93,  carbonate 29, BUN is 14, creatinine 4.44, glucose 108.  Protime this  admission 15.3, INR is 1.2.  Alkaline  phosphatase this admission 99, SGOT 11, SGPT is 9,  phosphorus is 2.8.  The patient is positive for opiates on drug screen but he does take  Lorcet four  times a day, benzodiazepines, cocaine, marijuana all  negative.  The BNP on admission was greater than 3200 and RPR study was  done, it was nonreactive.      Maple Mirza, PA      Rollene Rotunda, MD, [facility]  Electronically Signed    GM/MEDQ  D:  01/21/2009  T:  01/21/2009  Job:  [id number]   cc:   [facility]  Jorja Loa, M.D.

## 2011-05-06 NOTE — Consult Note (Signed)
NAME:  [name], [name]                         ACCOUNT NO.:  [id number]   MEDICAL RECORD NO.:  [id number]                   PATIENT TYPE:  IPS   LOCATION:  0304                                 FACILITY:  [facility]   PHYSICIAN:  Karlene Einstein, M.D.             DATE OF BIRTH:  1976-07-23   DATE OF CONSULTATION:  05/15/2003  DATE OF DISCHARGE:  05/16/2003                                   CONSULTATION   REQUESTING PHYSICIAN:  Jeanice Lim, M.D.   REASON FOR CONSULTATION:  Dehydration, prerenal azotemia, and normocytic  anemia.   REASON FOR CONSULTATION:  [age]-year-old male in the care of  behavioral health center had a syncopal episode in the bathroom today and  was taken to the emergency room.  He was given I.V. fluids and potassium for  dehydration and sent back to the behavioral health center.  The patient  currently states he is feeling better now and has no specific complaint.   REVIEW OF SYSTEMS:  Constitutional:  No acute distress.  Respiratory:  No  cough or shortness of breath.  Cardiovascular:  No lower extremity edema.  GI:  Denies abdominal pain, nausea, or vomiting.  GU:  Denies dysuria.  Neurology:  Denies numbness, tingling, weakness, or balance problems.  Currently he is having a headache.   PAST MEDICAL HISTORY:  Hypertension, asthma, anemia.   SOCIAL HISTORY:  Disabled secondary to mental health.  Single.  Denies  alcohol or drug abuse.   ALLERGIES:  ASPIRIN.   CURRENT MEDICATIONS:  1. Losartan 100 mg q.d.  2. Paxil 40 mg q.d.  3. Lithium 300 mg b.i.d.  4. Claritin 10 mg q.d.  5. Ambien 10 mg q.h.s.  6. Darvocet-N 100/650 t.i.d.  7. DDAVP 0.4 mg q.h.s.  8. Singulair 10 mg q.d.  9. Zyprexa 10 mg b.i.d.  10.      K-Dur 10 mEq q.d.  11.      Lotensin 20 mg q.d.   PHYSICAL EXAMINATION:  VITAL SIGNS:  97.3.  Pulse 67.  Respiratory rate 24.  Blood pressure 161/98.  Standing, systolic blood pressure 150, diastolic 95.  GENERAL:  No acute  distress.  NECK:  Supple.  No thyromegaly.  ENT:  Oropharynx moist and clear.  LUNGS:  Clear to auscultation bilaterally.  No wheezing.  HEART:  Regular, rate, and rhythm.  Normal S1 and S2.  ABDOMEN:  Soft and nontender.  Nondistended.  Positive bowel sounds.  EXTREMITIES:  No edema.  NEUROLOGIC:  Alert and oriented x3.   LABORATORY AND ACCESSORY DATA:  Lithium level 1.12.  Urine drug screen  negative.  Urinalysis - specific gravity 1.01.  Electrolytes negative.  Trace leukocyte esterase.  Lipase 20.  Sodium 136.  Potassium 5.5.  Chloride  107.  Carbon dioxide 22.  Glucose 104.  BUN 21.  Creatinine 1.7.  Calcium  9.2.  WBC 12.4.  Hemoglobin 10.6.  MCV 84.  Platelets  373.  RBC - folate  449.  TSH 4.455.  Ferritin 61.  Serum iron 56.  TIBC 313.  Iron saturation  18.   IMPRESSION AND PLAN:  1. Dehydration.  I.V. fluids were given in the ED.  Currently he is     asymptomatic.  Oral mucosa is moist.  Will check another set of     orthostatic blood pressure.  If orthostatic, consider doing I.V. fluids.  2. Prerenal azotemia, resolved.  Will recheck renal function.  3. Normocytic anemia.  Hemoglobin stable.  Not iron deficient.  Will check     stool guaiac, vitamin B-12, reticulocyte count, and recheck another     hemoglobin.  4. Hypertension, uncontrolled.  Losartan at maximum dose of 100 mg q.d.     Increase Lotensin to 40 mg q.d.  Dr. Nolon Rod Monguilod to follow in     the morning.                                               Karlene Einstein, M.D.    GD/MEDQ  D:  05/15/2003  T:  05/15/2003  Job:  [id number]
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
## 2011-05-06 NOTE — H&P (Signed)
NAME:  [name], [name]                         ACCOUNT NO.:  [id number]   MEDICAL RECORD NO.:  [id number]                   PATIENT TYPE:  IPS   LOCATION:  0405                                 FACILITY:  [facility]   PHYSICIAN:  Geoffery Lyons, M.D.                   DATE OF BIRTH:  01-Jun-1976   DATE OF ADMISSION:  01/16/2004  DATE OF DISCHARGE:  01/23/2004                         PSYCHIATRIC ADMISSION ASSESSMENT   IDENTIFYING INFORMATION:  This is a 35 year old white male who is divorced.  This is a voluntary admission.  His chief complaint:  All these things have  been piling up on me.   HISTORY OF PRESENT ILLNESS:  This patient with a history of depression was  previously on lithium at one point.  He cut his wrist with a knife on  January 28, requiring sutures.  He reports I don't know why I did it.  He  has a history of mood fluctuation with some psychotic behavior in the past.  He reports some obsessive worrying, primarily about his mother's health  since she has gotten some news of some chronic illness recently.  He reports  that this has been upsetting him quite a bit.  He admits that he felt  suicidal at the time but feels he was a bit mixed up in his mind and is not  exactly sure why he cut his wrists and he denies any chronic suicidal  ideation.  His mother has reported that he just would not get out of bed for  the past 2 weeks, is ruminating a lot, with depressed mood, and is not quite  himself.  He denies any history of substance abuse but in fact was  previously on opiates, which he was over using, and his primary care  physician has spent quite a bit of time getting him off Darvocet which he  had been taking for  Osgood-Schlatter disease.  He is not a reliable  historian.   PAST PSYCHIATRIC HISTORY:  This is the patient's second admission to [facility].  He ha a history of some psychotic behavior  in the past but has never been aggressive towards  himself or others.  He  does have a history of some suicidal ideation and also has a history of  becoming toxic on lithium.   SOCIAL HISTORY:  The patient was previously married and his wife left him  approximately 3 years ago.  He now lives back at home with his mother,  endorses some social isolation, not getting out of the house or  participating in any structured kind of program, little structure to his  time, no legal problems.  He is on disability for mental health reasons.   ALCOHOL AND DRUG HISTORY:  The patient in the past has been dependent on  pain pills for many years for pain which he attributed to Osgood-Schlatter  disease.  He  is now off these medicines.   PAST MEDICAL HISTORY:  The patient is followed by Dr. Sudie Bailey in  [place], [place].  Medical problems are hypertension, history of  Osgood-Schlatter disease, previous history of lithium toxicity, history of  proteinuria.  Past medical history is remarkable also for prerenal azotemia.   MEDICATIONS:  Lasix 80 mg p.o. daily, Wellbutrin XL 150 mg p.o. q.a.m.,  DDAVP 0.2 mg 4 tabs p.o. q.h.s., Chlorocon 10 mg p.o. q.a.m., Flexeril 5 mg  p.o. t.i.d., albuterol 90 mcg inhaler MDI 2 puffs q.4 hours p.r.n. for  asthma, Gabapentin 600 mg p.o. q.h.s., Temazepam 30 mg p.o. q.h.s., Norvasc  10 mg daily, Avapro 300 mg p.o. daily, Paxil CR 25 mg daily, and Zyprexa 10  mg p.o. q.a.m.   DRUG ALLERGIES:  None but the patient does have a history of lithium  toxicity.   POSITIVE PHYSICAL FINDINGS:  Please see the patient's physical examination  which was done at [facility].  Today, he is a large build Caucasian  male.  Vital signs on admission:  He was afebrile, pulse 96, respiratory  rate 22, blood pressure 148/98, 291 pounds, he is approximately 5 feet 9-3/4  inches tall, with a BMI calculated at approximately 43.  Physical and neuro  exam revealed some psychomotor slowing, with some affect flattening, but   otherwise a normal neuro exam.   DIAGNOSTIC STUDIES:  Revealed urine drug screen negative for all substances.  His hemoglobin was 10.1, hematocrit 30.6, BUN 20, creatinine 2.0.  His  thyroid panel is currently pending.   MENTAL STATUS EXAM:  This is a large build male who is fully alert,  pleasant, cooperative, with some psychomotor slowing, quite a blunted and  flattened affect.  Hygiene is poor.  He is quite disheveled.  Speech is  slowed in responses, decreased in amount, not initiating much conversation,  no pressure.  Mood is depressed, somewhat withdrawn.  Thought process  reveals some obsessive worrying, primarily about his mother's medical  condition, although he does not voice any specific fears about it, but  apparently she has been getting a lot of medical news and this is upsetting  him.  Positive for suicidal thought, just feeling hopeless and helpless  without a clear plan.  No homicidal thought, no auditory or visual  hallucinations.  Cognitively he is intact and oriented x3.  Intellectual  reveals some possible delay, difficult to assess.  Insight poor, judgment  and impulse control guarded.   ADMISSION DIAGNOSIS:   AXIS I:  1. Devers versus bipolar depression.  2. 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   AXIS II:  Deferred.   AXIS III:  Osgood-Schlatter disease, hypertension, proteinuria not otherwise  specified.   AXIS IV:  Moderate stress with worrying about his mother's health, some  social isolation.   AXIS V:  Current 30, past year 17.   INITIAL PLAN OF CARE:  Voluntarily admit the patient.  We have admitted him  to our intensive care program with q.15 minute checks, with a goal of  alleviating his depression and obsessive thought.  We have elected to  increase his Paxil to 37.5 mg.  We are going to force fluids at this point,  and he may have Gatorade.  We are going to recheck his CMET in the morning and we will consider the  possibility of starting him on Lamictal but we are  going to wait on that at this point and see how  he responds to increase in  the Paxil dose.  We have discussed the plan of care with him and he is in  agreement.   ESTIMATED LENGTH OF STAY:  5-6 days.     Margaret A. Scott, N.P.                   Geoffery Lyons, M.D.    MAS/MEDQ  D:  01/23/2004  T:  01/23/2004  Job:  [id number]

## 2011-05-06 NOTE — Discharge Summary (Signed)
NAME:  [name], [name]                         ACCOUNT NO.:  [id number]   MEDICAL RECORD NO.:  [id number]                   PATIENT TYPE:  INP   LOCATION:  A220                                 FACILITY:  [facility]   PHYSICIAN:  Sarita Bottom, M.D.                  DATE OF BIRTH:  Nov 06, 1976   DATE OF ADMISSION:  05/28/2003  DATE OF DISCHARGE:                                 DISCHARGE SUMMARY   DISCHARGE DIAGNOSES:  1. Lithium toxicity.  2. Diarrhea, resolved.  3. Other chronic problems include bipolar disorder.  4. Depression.  5. Hypertension.  6. Asthma.  7. Anuresis.   DISCHARGE MEDICATIONS:  1. Norvasc 10 mg daily.  2. Patient to go home on Lithium at a reduced dose of 300 mg daily instead     of 600.  3. Patient is to stop taking Lotensin as he has been advised not to take     Lotensin.   CONDITION ON DISCHARGE:  Stable and satisfactory.   Patient to be discharged in the care of his mother to followup with primary  M.D., Dr. Sudie Bailey, and his regular psychiatrist within 1-2 weeks.   HISTORY OF PRESENT ILLNESS:  [name] is a 35 year old man with a  history of bipolar disorder who was admitted to [facility] on May 9  when he presented to the emergency room with his mother with a complaint of  irrational behavior, excessive sleepiness, shortness of breath and diarrhea.   PHYSICAL EXAMINATION:  VITAL SIGNS:  On admission, shows a blood pressure of  164/87 with a heart rate of 102.  GENERAL: The young man lying comfortably in bed.  He was not in any  distress.  CHEST:  His chest was clear to auscultation.  HEART:  Heart sounds S1 and S2 were normal.  Rhythm was regular.  Rate was  normal.  No murmurs were appreciated.   LABORATORY EVALUATION:  Revealed a lithium level 2.0.  WBC was 12.7,  hematocrit was 30.7.  A blood gas done on admission showed a pH of 7.32,  pCO2 of 30, pO2 of 101, bicarbonate of 16.5, oxygen saturation 98%.  His  serum creatinine was  1.9.   Patient was admitted with an impression of lithium toxicity, diarrhea and  bipolar disorder.  A review of his medications showed the patient was taking  Lotensin which has a known drug interaction with lithium.  Lotensin was  subsequently discontinued and the patient's lithium dose was held for two  days.  His lithium levels gradually declined to a therapeutic range.   CLINICAL COURSE:  Essentially unremarkable.  There were no episodes of  excessive aggressiveness or irrational behavior.  Patient's behavior was  appropriate.  He was evaluated by the ACT team who also recommended that the  patient be seen as an outpatient in the psychiatric clinic.  He has  evaluated this morning on  rounds.  His blood pressure is 149/94, heart rate  is 90, temperature 98 degrees Farenheit.  His chest is clear.  Heart sounds  S1 and S2 are normal.  Rhythm is regular.  Abdomen is benign.  CNS is awake  and alert.  He is well oriented.  His latest lithium level is 1.43.  Sodium  is 135, potassium is 5.0, BUN is 20, creatinine is 1.9.  Hemoglobin is 10.7.  Patient will be discharged home to followup with primary M.D. as stated  above.   CONDITION ON DISCHARGE:  Stable.                                                Sarita Bottom, M.D.    DW/MEDQ  D:  05/30/2003  T:  05/30/2003  Job:  [id number]   cc:   Mila Homer. Sudie Bailey, M.D.  [address] [place], [place] [address]  Fax: [phone]

## 2011-05-06 NOTE — Discharge Summary (Signed)
NAME:  [name], [name]                         ACCOUNT NO.:  [id number]   MEDICAL RECORD NO.:  [id number]                   PATIENT TYPE:  IPS   LOCATION:  0405                                 [facility]ACILITY[facility]   PHYSICIAN:  Geoffery Lyons, M.D.                   DATE OF BIRTH:  27-Jan-1976   DATE OF ADMISSION:  01/16/2004  DATE OF DISCHARGE:  01/23/2004                                 DISCHARGE SUMMARY   CHIEF COMPLAINT AND PRESENT ILLNESS:  This was the second admission to [facility] for this 35 year old white male, divorced,  voluntarily admitted.  He had a history of depression.  He was previously on  lithium.  He cut his wrist with a knife requiring sutures.  He claimed that  he did not know why he did it.  He had a history of mood fluctuations and  psychotic behavior in the past.  Some obsessive worrying primarily about his  mother's health.  He had gotten some new of some chronic illnesses recently.  He reported that this had been upsetting him quite a bit.  He felt suicidal  at the time but thought he was bit mixed up in his mind.  He was not exactly  sure why he cut his wrists.  He apparently could not get out of bed for the  past two weeks, ruminating a lot, depressed mood, not quite himself.   PAST PSYCHIATRIC HISTORY:  This was the second admission to [facility].  He had some psychotic behavior in the past but  had never been aggressive toward himself or others.  He had a history of  been toxic on lithium.   SUBSTANCE ABUSE HISTORY:  In the past, he had been dependent on pain pills  for many years.  Now off these medications.   PAST MEDICAL HISTORY:  1. Hypertension.  2. Osgood-Schlatter disease.   MEDICATIONS:  1. Lasix 30 mg daily.  2. Wellbutrin XL 150 mg in the morning.  3. DDAVP 0.2 mg four tablets at night.  4. Chlor-Con 10 mg in the morning.  5. Flexeril 5 mg three times a day.  6. Albuterol 90 mcg inhaler MDI  two puffs every four hours p.r.n. for     asthma.  7. Neurontin 600 mg at night.  8. Restoril 30 mg at night.  9. Norvasc 10 mg daily.  10.      Avapro 300 mg daily.  11.      Paxil CR 25 mg daily.  12.      Zyprexa 10 mg in the morning.   PHYSICAL EXAMINATION:  Physical examination was performed, failed to show  any acute findings.   LABORATORY DATA:  Blood chemistries: BUN fluctuated from 35 to 36,  creatinine fluctuated from 2.3 to 2.0.  Liver profile was within normal  limits.  Hemoglobin A1c less than  3.3.  Lipid profile: Cholesterol 385,  triglycerides 686.   MENTAL STATUS EXAM:  Mental status exam revealed a large built male, full  alert, pleasant, cooperative with some psychomotor slowing, quite blunted  affect, poor hygiene quite disheveled.  Speech was slow in response,  decreased in amount, not initiating much conversation, no pressure.  Mood  was depressed, somewhat withdrawn.  Thought processes revealed some  obsessive worrying primarily about his mother's medical condition; positive  for suicidal thoughts, feeling hopeless and helpless without a clear plan;  no homicidal ideas, no auditory hallucinations.  Cognitive: Cognition was  well preserved.   ADMISSION DIAGNOSES:   AXIS I:  Major depression versus bipolar disorder, depressed.   AXIS II:  No diagnosis.   AXIS III:  1. Osgood-Schlatter disease.  2. Hypertension.  3. Proteinuria.   AXIS IV:  Moderate.   AXIS V:  Global assessment of functioning upon admission 30, highest global  assessment of functioning in the last year 58.   HOSPITAL COURSE:  He was admitted and started in intensive individual and  group psychotherapy.  He was given the albuterol, the Neurontin, the  Restoril, Norvasc 10 mg daily, Avapro 300 mg daily, Paxil CR 25 mg daily,  Zyprexa 10 mg in the morning, Lasix 30 mg daily, Wellbutrin XL 150 mg in the  morning, DDAVP 0.2 mg four tablets at night, Chlor-Con 10 mg in the morning,   Flexeril 5 mg three times day.  Paxil was increased to 37.5 mg.  Internal  medicine was consulted.  They discontinued the Lasix, held the Avapro, and  he was placed on clonidine 0.1 mg twice a day.  A 24-hour urine collection  was obtained for total protein.  Other laboratory workups were ordered.  He  was initially in bed, minimizing what happened.  He apparently claimed he  cut his wrist after a conflict with his mother.  Mood was depressed, affect  was depressed, psychomotor retardation, but he denied any suicidal ideas,  any homicidal ideas.  He was somewhat guarded.  As the hospitalization  progressed, he felt he was better.  Compliance with medication was an issue.  He could not explain why he cut wrist.  He had been using knives and  something got into him to cut himself.  He became upset and frightened after  he cut himself and saw the blood.  He claimed he did not hear a voice.  He  was not planning to hurt himself.  He was still worried about his mother  situation.  He gained weight, 50 pounds in the last few months, possible  side effects from Zyprexa.  We discussed the fact that Zyprexa could have  been involved with the weight gain as well as the lipid abnormalities but he  was wanting to stay on the Zyprexa because he felt it would work for him and  he was just letting his outpatient psychiatrist make a decision of any major  medication change.  He continued to stabilize.  His mood gradually improved,  his affect became a little brighter, and on February 4, he was in full  contact with reality.  He denied that there was no evidence of suicidal  ideas, no homicidal ideas, there were no delusions or hallucinations.  He  was willing to pursue further outpatient treatment.  Medical evaluation was  completed.  He was going to be referred to his outpatient physician.   DISCHARGE DIAGNOSES:  AXIS I:  Bipolar disorder, depressed with psychotic  features.   AXIS II:  No  diagnosis.   AXIS III:  1. Osgood-Schlatter disease.  2. Arterial hypertension.  3. Proteinuria.  4. Hyperlipidemia.  5. Hypercholesterolemia.   AXIS IV:  Moderate.   AXIS V:  Global assessment of functioning upon discharge 50.   DISCHARGE MEDICATIONS:  1. Neosporin ointment.  2. Neurontin 600 mg at night.  3. Norvasc 20 mg daily.  4. Avapro 300 mg daily.  5. Zyprexa 10 mg daily.  6. Wellbutrin XL 150 mg in the morning.  7. DDAVP 0.2 mg four at bedtime.  8. Flexeril 5 mg three times a day.  9. Paxil CR 37.5 mg at night.  10.      Lipitor 10 mg daily.  11.      Catapres 0.2 mg twice a day.  12.      Kayexalate 50 mg per 60 mL.  13.      Lasix 40 mg one and a half daily.  14.      Albuterol inhaler two puffs every four hours.  15.      Restoril 30 mg at bedtime for sleep.   FOLLOW UP:  He was to follow up at [facility]  and [facility].                                               Geoffery Lyons, M.D.    IL/MEDQ  D:  02/16/2004  T:  02/17/2004  Job:  [id number]
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
## 2011-05-06 NOTE — H&P (Signed)
NAME:  [name], [name]                         ACCOUNT NO.:  [id number]   MEDICAL RECORD NO.:  [id number]                   PATIENT TYPE:  OBV   LOCATION:  [facility]03                                 FACILITY:  [facility]   PHYSICIAN:  Sarita Bottom, M.D.                  DATE OF BIRTH:  [birth year]   DATE OF ADMISSION:  05/28/2003  DATE OF DISCHARGE:                                HISTORY & PHYSICAL   CHIEF COMPLAINT:  He was short of breath.   HISTORY OF PRESENT ILLNESS:  [name] is a 35 year old man with a  history of mental retardation, bipolar disorder who was recently started on  Lithium.  The patient's mother claims that the patient has been behaving  irrationally over the past few days.  She also says he has been sleeping  most of the day.  There is also a complaint of the patient being short of  breath this morning.  His mother therefore decided to bring him to the  emergency room for further evaluation.  There was also a concern about a  possibility of Lithium toxicity; however, Lithium levels could not be  obtained from the emergency room.  The decision was therefore made to admit  the patient for further monitoring and evaluation.   REVIEW OF SYMPTOMS:  GENERAL:  The patient denies any fevers or chills.  RESPIRATORY:  He admits to being short of breath, but denies any cough.  CARDIAC:  Denies any palpitations or chest pain.  GENITOURINARY:  Denies any  nausea, vomiting, but admits to having diarrheal stools several times a day  over the past few days.  NEUROLOGIC:  Denies any dizziness.   PAST MEDICAL HISTORY:  1. Asthma.  2. Genital disorder.  3. History of bipolar disorder.  4. Hypertension.  5. Enuresis.   MEDICATIONS:  1. Singulair 10 mg q.d.  2. Claritin 10 mg q.d.  3. Ambien 10 mg q.h.s.  4. Darvocet-N one tablet t.i.d.  5. Zyprexa 10 mg b.i.d.  6. Lotensin 40 mg q.d.  7. Cozaar 50 mg q.d.  8. Paxil CR 25 mg q.d.  9. Testosterone injection monthly.   ALLERGIES:  ASPIRIN, rash or type unknown.   FAMILY HISTORY:  Significant for several members with depression.   SOCIAL HISTORY:  He has been married twice.  He has no children.  He smokes  about one pack of cigarettes per day.  He does not drink alcohol.  Currently  lives with his mother.   PHYSICAL EXAMINATION:  VITAL SIGNS:  Blood pressure 164/87, heart rate 102,  temperature 97.9 degrees Fahrenheit.  GENERAL:  He is a young man lying comfortably in bed.  He appears slightly  dyspneic.  HEENT:  He is not pale.  He is anicteric.  Oral mucosa is moist.  He is  missing several of his front teeth.  NECK:  Supple, no thyromegaly, no lymphadenopathy,  no carotid bruit.  CHEST:  Air entry good bilaterally.  No wheezes were heard.  No crackles  were heard.  CARDIAC:  Heart sounds S1, S2 normal.  Rhythm is regular.  ABDOMEN:  Obese, soft, no tenderness on palpation.  No masses were felt.  NEUROLOGIC:  He is awake and appears well-oriented.  EXTREMITIES:  He has no edema.   LABORATORY DATA AND X-RAY FINDINGS:  Serum alcohol level is less than 5.  Alk phos level is elevated at 153.  D-dimer 0.23.  WBC 12.7, hemoglobin  10.3, hematocrit 30.7, MCV 84.3, platelet count 408, neutrophils 81%.  ABG  done on room air shows pH 7.32, pCO2 32, pO2 101, bicarb 16.5, oxygen  saturations 98.4%.  Sodium 137, potassium 4.5, BUN 18, creatinine 1.8,  glucose 111, calcium 8.5.   EKG shows sinus rhythm with borderline first-degree AV block.  He has left  ventricular hypertrophy, ST inversion in I and aVL.  Old EKGs not available  for comparison.   ASSESSMENT/PLAN:  1. Diarrhea.  The patient will be admitted to the ward.  He will be given     intravenous hydration.  Stool will be sent for fecal leukocyte and ova     and parasite.  He will be treated empirically with Imodium.  2. Bipolar disorder.  The patient will be resumed on his Lithium, Paxil and     Zyprexa.  His Lithium levels will be checked and his  medications adjusted     according.  The ACT team will be called for further evaluation for     possible transfer to a psychiatric ward.  3. Hypertension.  The patient will be continued on Lotensin 40 mg q.d.  His     blood pressure will be monitored on this regimen.  4. Asthma, stable.  The patient will be given albuterol meter dose inhaler     and he will be continued on his Singulair 10 mg q.d.  5. Enuresis.  The patient will be maintained on his DATVP 0.4 mg p.o. q.h.s.  6. The patient is to be admitted under the hospitalist service as Dr.     Sudie Bailey is away.  Further management will depend on clinical course.                                               Sarita Bottom, M.D.    DW/MEDQ  D:  05/28/2003  T:  05/28/2003  Job:  [id number]

## 2011-05-07 NOTE — Group Therapy Note (Signed)
  NAMERIAAN, TOLEDO               ACCOUNT NO.:  [id number]  MEDICAL RECORD NO.:  [id number]           PATIENT TYPE:  LOCATION:                                 FACILITY:  PHYSICIAN:  Zetta Stoneman L. Juanetta Gosling, M.D.DATE OF BIRTH:  [birth year]  DATE OF PROCEDURE:  04/29/2011 DATE OF DISCHARGE:                                PROGRESS NOTE   Mr. Shovlin is admitted with renal failure, hypertension, and multiple other medical problems.  He has improved since he has been hospitalized and he is set for discharge after he does his dialysis today.  He says he feels well, he has no complaints, and no new problems.  His vital signs are as recorded in e-chart.  His chest is clear.  His heart is regular.  His abdomen is soft and he looks comfortable.  ASSESSMENT:  He is better.  PLAN:  He is to be discharged after dialysis today.     Thais Silberstein L. Juanetta Gosling, M.D.     ELH/MEDQ  D:  04/29/2011  T:  04/30/2011  Job:  [id number]  Electronically Signed by Kari Baars M.D. on 05/07/2011 09:37:25 AM

## 2011-05-09 NOTE — Consult Note (Signed)
NAMEJEFFORY, SNELGROVE               ACCOUNT NO.:  [id number]  MEDICAL RECORD NO.:  [id number]           PATIENT TYPE:  E  LOCATION:  [facility]                          FACILITY:  [facility]  PHYSICIAN:  Jorja Loa, M.D.DATE OF BIRTH:  08-Jul-1976  DATE OF CONSULTATION: DATE OF DISCHARGE:  04/23/2011                                CONSULTATION   REASON FOR CONSULTATION:  CHF.  This is one of multiple admission for [name].  The patient with end- stage renal disease, on maintenance hemodialysis on Tuesday, Thursday, and Saturday and history of recurrent CHF from noncompliance with his diet and drink, presently came to the emergency room with difficulty breathing, orthopnea, and paroxysmal nocturnal dyspnea.  According to the patient, he went to Dialysis yesterday, they have remove some fluid; however, he went home and still having similar problem and he decided to come this morning.  He does not have any chest pain.  He denies any fevers, chills, or sweating.  According to the patient, however, recently also he was having some bronchitis.  Because of that, he was put on D5.  PAST MEDICAL HISTORY:  As stated above. 1. Recurrent CHF. 2. History of end-stage renal disease.  He is on maintenance     hemodialysis Tuesday, Thursday, Saturday.  Last dialysis was     yesterday. 3. History of coronary artery disease, status stent placement,     asymptomatic. 4. History of hypertension. 5. History of bipolar disorder. 6. History of depression. 7. History of restless legs syndrome. 8. Chronic stable angina. 9. History of anemia. 10.History of dyslipidemia.  MEDICATIONS: 1. Amlodipine 10 mg p.o. daily. 2. Aspirin 81 mg p.o. daily. 3. Crestor 20 mg p.o. daily. 4. Cymbalta 60 mg p.o. daily. 5. Hydralazine 50 mg p.o. t.i.d. 6. Labetalol 400 mg p.o. b.i.d. 7. Lisinopril 20 mg p.o. daily. 8. Plavix 75 mg p.o. daily. 9. Protonix 40 mg p.o. daily. 10.Xanax 0.5 mg p.o.  t.i.d. 11.Zyprexa.  ALLERGIES:  He is on allergy for ZOCOR, TORADOL, NAPROSYN, METHADONE, also LITHIUM, IBUPROFEN, FENTANYL.  SOCIAL HISTORY:  He has history of smoking.  He said he stopped his smoking 3 days ago.  He has also occasional history of illicit drug use. He denies at this moment using any drugs.  No history of also alcohol.  FAMILY HISTORY:  Severe renal failure in his mom.  Used to have failure, upon moment not sure whether she has any more problem.  REVIEW OF SYSTEMS:  His main complaint seems to be shortness of breath, dizziness, also orthopnea, paroxysmal nocturnal dyspnea.  He has also some cough, nonproductive.  He denies any chest pain.  He does not have any fever, chills, or sweating.  PHYSICAL EXAMINATION:  VITAL SIGNS:  His blood pressure is 190/71, his heart rate is 86, respiratory rate is 20, he is on 100% non-rebreather. HEENT:  No conjunctival pallor, nonicterus.  Oral mucosa seems to be moist.  He had some fascial puffiness. CHEST:  He has bilateral wheezing and also expiratory rhonchi. HEART:  Revealed regular rate and rhythm.  No murmur.  No S3. ABDOMEN:  Soft, positive bowel sounds.  EXTREMITIES:  He has 1 to 2+ edema.  His white blood cell count is 7.2, hemoglobin 10.4, hematocrit 32.3, platelet of 264.  His sodium is 133, potassium 3.8, CO2 of 27, creatinine is 6.73.  He has a chest x-ray, which is done today basically showed CHF and small right pleural effusion.  Basically, could be some coexisting consultation of the atelectasis.  ASSESSMENT: 1. Congestive heart failure.  This is because of uncontrolled fluid     and salt intake, recurrent problem, very noncompliant, occasionally     stopped his dialysis.  He was here about 2 days ago because of     similar issue presenting for dialysis, but as stated above he came     back with the same issue. 2. End-stage renal disease, status post hemodialysis, BUN and     creatinine within acceptable range,  normal potassium. 3. History of chronic obstructive pulmonary disease, smoking.  He is     presently getting inhaler. 4. History of hypertension, blood pressure seems to be high.  He did     not take any of his medications when he came from home. 5. History of anemia.  He is on Epogen. 6. History of coronary artery disease, status post stent placement,     asymptomatic. 7. History of depression. 8. History of gastroesophageal reflux disease. 9. History of bipolar disorder. 10.History of restless legs syndrome. 11.History of hyperphosphatemia.  He is on a binder.  RECOMMENDATIONS:  We will make arrangement for the patient to get dialysis.  We will try to get about 4 liters today.  We will continue his other medications, and the patient advised to cut down his salt and fluid intake.  We will continue with other treatment.     Jorja Loa, M.D.     BB/MEDQ  D:  04/27/2011  T:  04/28/2011  Job:  [id number]  Electronically Signed by Jorja Loa M.D. on 05/09/2011 01:41:00 PM

## 2011-05-09 NOTE — Consult Note (Signed)
[name], [name]               ACCOUNT NO.:  [id number]  MEDICAL RECORD NO.:  [id number]           PATIENT TYPE:  LOCATION:                                 FACILITY:  PHYSICIAN:  [name], M.D.DATE OF BIRTH:  1976/08/31  DATE OF CONSULTATION: DATE OF DISCHARGE:                                CONSULTATION   REASON FOR CONSULTATION:  CHF.  [name] is a patient with end-stage renal disease on hemodialysis on Tuesday, Thursday, and Saturday.  His last dialysis was yesterday. Presently, he came with complaints of orthopnea, shortness of breath, and paroxysmal nocturnal dyspnea since this morning.  The patient had recurrent problem with compliance with his diet and fluid.  He denies any nausea or vomiting.  He was dialyzed yesterday and was able to remove about 4 liters.  He denies any fevers, chills, or sweating.  PAST MEDICAL HISTORY: 1. He has history of recurrent CHF from noncompliance with his diet     and fluid intake and question of also from the dialysis. 2. History of end-stage renal disease.  He is on maintenance     hemodialysis on Tuesday, Thursday, and Saturday.  Last dialysis was     yesterday. 3. History of hypertension. 4. History of coronary artery disease, status post stent placement. 5. History of bipolar disorder. 6. History of hypertension. 7. History of depression. 8. Unstable angina. 9. History of restless legs syndrome. 10.Chronic back pain. 11.History of anemia. 12.History of dyslipidemia.  His medications as an outpatient consist of: 1. Renagel 800 mg 3 tablets p.o. t.i.d. with meals. 2. He is also on ReQuip 2 mg at night. 3. Prinivil 20 mg p.o. daily. 4. Normodyne 200 mg p.o. b.i.d. 5. Hydralazine 50 mg p.o. t.i.d. 6. Cymbalta 60 mg p.o. daily. 7. He is also on Coreg 12.5 mg p.o. b.i.d. 8. Norvasc 10 mg p.o. daily. 9. He is also getting Xanax 0.25 mg p.o. daily.  SOCIAL HISTORY:  He has history of smoking and also occasional use  of illicit drug, but he denies at this moment using any.  FAMILY HISTORY:  No history of renal failure.  ALLERGIES: 1. IBUPROFEN. 2. LITHIUM. 3. METHADONE. 4. ULTRAM. 5. ZOCOR. 6. PHENYTOIN. 7. KETOROLAC.  REVIEW OF SYSTEMS:  His main complaints to me shortness of breath and also he has some orthopnea and paroxysmal nocturnal dyspnea.  He denies any fever, chills, or sweating.  He has occasional cough.  He does not produce any sputum.  He denies any urgency or frequency.  He has some urine.  He also complains of swelling of the legs.  PHYSICAL EXAMINATION:  VITAL SIGNS:  His blood pressure is 188/91.  His heart rate is about 80.  He is afebrile. HEENT:  He has some puffiness of the eyelid.  He has moist oral mucosa. NECK:  He has elevated JVP. CHEST:  Decreased breath sounds.  He has some inspiratory and also expiratory wheezing. HEART:  Regular rate and rhythm.  No murmur, no S3. ABDOMEN:  Soft, positive bowel sounds. EXTREMITIES:  He has 2+ edema.  His white blood cell count is 5.6, hemoglobin 10.1,  hematocrit 31.3, and platelet of 199.  Sodium 133, potassium 3.6, BUN is 22, and creatinine 6.91.  His BNP is more than 3200.  He has a chest x-ray which was done here in the ER, he has bilateral pulmonary infiltrates, right greater than left and there is a possible question of pulmonary edema.  He has also previous infiltrate, but that was treated for possible pneumonia about 3 weeks ago.  ASSESSMENT: 1. Congestive heart failure, recurrent problem.  The patient had his     dialysis yesterday.  Unfortunately, he came back with fluid     overload, high BNP, complaining of also shortness of breath and     also chest x-ray with possible pulmonary edema. 2. End-stage renal disease.  He is status post hemodialysis yesterday.     BUN and creatinine within acceptable range and potassium is normal. 3. History of hypertension.  Blood pressure is high, most likely     because he did  not take his antihypertensive medications. 4. History of anemia.  He is on Epogen.  H and H is good. 5. History of coronary artery disease, status post angioplasty.  He is     presently asymptomatic. 6. History of bipolar disorder. 7. History of depression. 8. History of chronic pain syndrome.  Usually, he comes at times for     pain medication. 9. History of chronic obstructive pulmonary disease.  He is on     inhaler, but he continues to smoke. 10.History of hypercholesterolemia. 11.History of hyperphosphatemia.  He is on Renagel at this moment.  We     do not have any phosphorus level.  RECOMMENDATIONS:  We will make arrangement for the patient to get dialysis today.  We will use 3K/2.5 calcium bath and we will try to get about 4 liters.  We will continue his other medications and will follow the patient and will check his CBC and BMET in the morning.     [name], M.D.     BB/MEDQ  D:  03/27/2011  T:  03/28/2011  Job:  [id number]  Electronically Signed by [name] M.D. on 05/09/2011 01:40:56 PM

## 2011-05-11 ENCOUNTER — Emergency Department (HOSPITAL_COMMUNITY)
Admission: EM | Admit: 2011-05-11 | Discharge: 2011-05-12 | Disposition: A | Discharge status: Home / Self Care | Payer: Medicare Other | Attending: Emergency Medicine | Admitting: Emergency Medicine

## 2011-05-11 DIAGNOSIS — R1011 Right upper quadrant pain: Secondary | ICD-10-CM | POA: Insufficient documentation

## 2011-05-11 DIAGNOSIS — I2510 Atherosclerotic heart disease of native coronary artery without angina pectoris: Secondary | ICD-10-CM | POA: Insufficient documentation

## 2011-05-11 DIAGNOSIS — R1013 Epigastric pain: Secondary | ICD-10-CM | POA: Insufficient documentation

## 2011-05-11 DIAGNOSIS — N186 End stage renal disease: Secondary | ICD-10-CM | POA: Insufficient documentation

## 2011-05-11 DIAGNOSIS — F341 Dysthymic disorder: Secondary | ICD-10-CM | POA: Insufficient documentation

## 2011-05-11 DIAGNOSIS — I120 Hypertensive chronic kidney disease with stage 5 chronic kidney disease or end stage renal disease: Secondary | ICD-10-CM | POA: Insufficient documentation

## 2011-05-11 DIAGNOSIS — R112 Nausea with vomiting, unspecified: Secondary | ICD-10-CM | POA: Insufficient documentation

## 2011-05-11 DIAGNOSIS — M79609 Pain in unspecified limb: Secondary | ICD-10-CM | POA: Insufficient documentation

## 2011-05-11 DIAGNOSIS — J449 Chronic obstructive pulmonary disease, unspecified: Secondary | ICD-10-CM | POA: Insufficient documentation

## 2011-05-11 DIAGNOSIS — J4489 Other specified chronic obstructive pulmonary disease: Secondary | ICD-10-CM | POA: Insufficient documentation

## 2011-05-11 DIAGNOSIS — I509 Heart failure, unspecified: Secondary | ICD-10-CM | POA: Insufficient documentation

## 2011-05-11 DIAGNOSIS — G8929 Other chronic pain: Secondary | ICD-10-CM | POA: Insufficient documentation

## 2011-05-12 LAB — DIFFERENTIAL
Eosinophils Absolute: 0.1 10*3/uL (ref 0.0–0.7)
Eosinophils Relative: 3 % (ref 0–5)
Lymphocytes Relative: 45 % (ref 12–46)
Lymphs Abs: 1.8 10*3/uL (ref 0.7–4.0)
Monocytes Absolute: 0.4 10*3/uL (ref 0.1–1.0)

## 2011-05-12 LAB — HEPATIC FUNCTION PANEL
Bilirubin, Direct: 0.1 mg/dL (ref 0.0–0.3)
Indirect Bilirubin: 0.1 mg/dL — ABNORMAL LOW (ref 0.3–0.9)
Total Bilirubin: 0.2 mg/dL — ABNORMAL LOW (ref 0.3–1.2)

## 2011-05-12 LAB — BASIC METABOLIC PANEL
CO2: 25 mEq/L (ref 19–32)
Calcium: 11.7 mg/dL — ABNORMAL HIGH (ref 8.4–10.5)
Creatinine, Ser: 9.19 mg/dL — ABNORMAL HIGH (ref 0.4–1.5)
GFR calc Af Amer: 8 mL/min — ABNORMAL LOW (ref >60)
GFR calc non Af Amer: 7 mL/min — ABNORMAL LOW (ref >60)

## 2011-05-12 LAB — CBC
HCT: 32.6 % — ABNORMAL LOW (ref 39.0–52.0)
MCH: 27.8 pg (ref 26.0–34.0)
MCHC: 32.8 g/dL (ref 30.0–36.0)
MCV: 84.7 fL (ref 78.0–100.0)
RDW: 17 % — ABNORMAL HIGH (ref 11.5–15.5)

## 2011-05-12 LAB — LIPASE, BLOOD: Lipase: 106 U/L — ABNORMAL HIGH (ref 11–59)

## 2011-05-17 ENCOUNTER — Emergency Department (HOSPITAL_COMMUNITY)
Admission: EM | Admit: 2011-05-17 | Discharge: 2011-05-17 | Disposition: A | Discharge status: Home / Self Care | Payer: Medicare Other | Attending: Emergency Medicine | Admitting: Emergency Medicine

## 2011-05-17 DIAGNOSIS — R109 Unspecified abdominal pain: Secondary | ICD-10-CM | POA: Insufficient documentation

## 2011-05-17 DIAGNOSIS — J4489 Other specified chronic obstructive pulmonary disease: Secondary | ICD-10-CM | POA: Insufficient documentation

## 2011-05-17 DIAGNOSIS — I120 Hypertensive chronic kidney disease with stage 5 chronic kidney disease or end stage renal disease: Secondary | ICD-10-CM | POA: Insufficient documentation

## 2011-05-17 DIAGNOSIS — N186 End stage renal disease: Secondary | ICD-10-CM | POA: Insufficient documentation

## 2011-05-17 DIAGNOSIS — F17200 Nicotine dependence, unspecified, uncomplicated: Secondary | ICD-10-CM | POA: Insufficient documentation

## 2011-05-17 DIAGNOSIS — I2510 Atherosclerotic heart disease of native coronary artery without angina pectoris: Secondary | ICD-10-CM | POA: Insufficient documentation

## 2011-05-17 DIAGNOSIS — J449 Chronic obstructive pulmonary disease, unspecified: Secondary | ICD-10-CM | POA: Insufficient documentation

## 2011-05-24 ENCOUNTER — Emergency Department (HOSPITAL_COMMUNITY)
Admission: EM | Admit: 2011-05-24 | Discharge: 2011-05-24 | Disposition: A | Discharge status: Home / Self Care | Payer: Medicare Other | Attending: Emergency Medicine | Admitting: Emergency Medicine

## 2011-05-24 DIAGNOSIS — Z7982 Long term (current) use of aspirin: Secondary | ICD-10-CM | POA: Insufficient documentation

## 2011-05-24 DIAGNOSIS — N186 End stage renal disease: Secondary | ICD-10-CM | POA: Insufficient documentation

## 2011-05-24 DIAGNOSIS — IMO0001 Reserved for inherently not codable concepts without codable children: Secondary | ICD-10-CM | POA: Insufficient documentation

## 2011-05-24 DIAGNOSIS — R109 Unspecified abdominal pain: Secondary | ICD-10-CM | POA: Insufficient documentation

## 2011-05-24 DIAGNOSIS — F17200 Nicotine dependence, unspecified, uncomplicated: Secondary | ICD-10-CM | POA: Insufficient documentation

## 2011-05-24 DIAGNOSIS — Z7902 Long term (current) use of antithrombotics/antiplatelets: Secondary | ICD-10-CM | POA: Insufficient documentation

## 2011-05-28 ENCOUNTER — Emergency Department (HOSPITAL_COMMUNITY)
Admission: EM | Admit: 2011-05-28 | Discharge: 2011-05-28 | Disposition: A | Discharge status: Home / Self Care | Payer: Medicare Other | Attending: Emergency Medicine | Admitting: Emergency Medicine

## 2011-05-28 ENCOUNTER — Emergency Department (HOSPITAL_COMMUNITY): Payer: Medicare Other

## 2011-05-28 DIAGNOSIS — J4489 Other specified chronic obstructive pulmonary disease: Secondary | ICD-10-CM | POA: Insufficient documentation

## 2011-05-28 DIAGNOSIS — G8929 Other chronic pain: Secondary | ICD-10-CM | POA: Insufficient documentation

## 2011-05-28 DIAGNOSIS — I2510 Atherosclerotic heart disease of native coronary artery without angina pectoris: Secondary | ICD-10-CM | POA: Insufficient documentation

## 2011-05-28 DIAGNOSIS — Z79899 Other long term (current) drug therapy: Secondary | ICD-10-CM | POA: Insufficient documentation

## 2011-05-28 DIAGNOSIS — I120 Hypertensive chronic kidney disease with stage 5 chronic kidney disease or end stage renal disease: Secondary | ICD-10-CM | POA: Insufficient documentation

## 2011-05-28 DIAGNOSIS — D649 Anemia, unspecified: Secondary | ICD-10-CM | POA: Insufficient documentation

## 2011-05-28 DIAGNOSIS — Z992 Dependence on renal dialysis: Secondary | ICD-10-CM | POA: Insufficient documentation

## 2011-05-28 DIAGNOSIS — I509 Heart failure, unspecified: Secondary | ICD-10-CM | POA: Insufficient documentation

## 2011-05-28 DIAGNOSIS — R0602 Shortness of breath: Secondary | ICD-10-CM | POA: Insufficient documentation

## 2011-05-28 DIAGNOSIS — R079 Chest pain, unspecified: Secondary | ICD-10-CM | POA: Insufficient documentation

## 2011-05-28 DIAGNOSIS — J449 Chronic obstructive pulmonary disease, unspecified: Secondary | ICD-10-CM | POA: Insufficient documentation

## 2011-05-28 DIAGNOSIS — N186 End stage renal disease: Secondary | ICD-10-CM | POA: Insufficient documentation

## 2011-05-28 LAB — TROPONIN I: Troponin I: <0.3 ng/mL (ref <0.30)

## 2011-05-28 LAB — BASIC METABOLIC PANEL
CO2: 25 mEq/L (ref 19–32)
Calcium: 10.7 mg/dL — ABNORMAL HIGH (ref 8.4–10.5)
GFR calc non Af Amer: 12 mL/min — ABNORMAL LOW (ref >60)
Sodium: 135 mEq/L (ref 135–145)

## 2011-05-28 LAB — CBC
MCH: 28 pg (ref 26.0–34.0)
Platelets: 91 10*3/uL — ABNORMAL LOW (ref 150–400)
RBC: 2.89 MIL/uL — ABNORMAL LOW (ref 4.22–5.81)
WBC: 4.6 10*3/uL (ref 4.0–10.5)

## 2011-05-28 LAB — CK TOTAL AND CKMB (NOT AT ARMC): Total CK: 241 U/L — ABNORMAL HIGH (ref 7–232)

## 2011-05-28 LAB — PRO B NATRIURETIC PEPTIDE: Pro B Natriuretic peptide (BNP): >70000 pg/mL — ABNORMAL HIGH (ref 0–125)

## 2011-06-02 ENCOUNTER — Emergency Department (HOSPITAL_COMMUNITY)
Admission: EM | Admit: 2011-06-02 | Discharge: 2011-06-02 | Disposition: A | Discharge status: Home / Self Care | Payer: Medicare Other | Attending: Emergency Medicine | Admitting: Emergency Medicine

## 2011-06-02 ENCOUNTER — Inpatient Hospital Stay (HOSPITAL_COMMUNITY): Payer: Medicare Other

## 2011-06-02 ENCOUNTER — Inpatient Hospital Stay (HOSPITAL_COMMUNITY)
Admission: EM | Admit: 2011-06-02 | Discharge: 2011-06-04 | DRG: 291 | Disposition: A | Discharge status: Home / Self Care | Payer: Medicare Other | Attending: Emergency Medicine | Admitting: Emergency Medicine

## 2011-06-02 ENCOUNTER — Emergency Department (HOSPITAL_COMMUNITY): Payer: Medicare Other

## 2011-06-02 DIAGNOSIS — Z992 Dependence on renal dialysis: Secondary | ICD-10-CM

## 2011-06-02 DIAGNOSIS — F259 Schizoaffective disorder, unspecified: Secondary | ICD-10-CM | POA: Diagnosis present

## 2011-06-02 DIAGNOSIS — R197 Diarrhea, unspecified: Secondary | ICD-10-CM | POA: Insufficient documentation

## 2011-06-02 DIAGNOSIS — F609 Personality disorder, unspecified: Secondary | ICD-10-CM | POA: Insufficient documentation

## 2011-06-02 DIAGNOSIS — I2510 Atherosclerotic heart disease of native coronary artery without angina pectoris: Secondary | ICD-10-CM | POA: Diagnosis present

## 2011-06-02 DIAGNOSIS — Z7982 Long term (current) use of aspirin: Secondary | ICD-10-CM | POA: Insufficient documentation

## 2011-06-02 DIAGNOSIS — IMO0001 Reserved for inherently not codable concepts without codable children: Secondary | ICD-10-CM | POA: Insufficient documentation

## 2011-06-02 DIAGNOSIS — E785 Hyperlipidemia, unspecified: Secondary | ICD-10-CM | POA: Diagnosis present

## 2011-06-02 DIAGNOSIS — R109 Unspecified abdominal pain: Secondary | ICD-10-CM | POA: Insufficient documentation

## 2011-06-02 DIAGNOSIS — I509 Heart failure, unspecified: Secondary | ICD-10-CM | POA: Insufficient documentation

## 2011-06-02 DIAGNOSIS — K219 Gastro-esophageal reflux disease without esophagitis: Secondary | ICD-10-CM | POA: Insufficient documentation

## 2011-06-02 DIAGNOSIS — Z9119 Patient's noncompliance with other medical treatment and regimen: Secondary | ICD-10-CM

## 2011-06-02 DIAGNOSIS — Z91199 Patient's noncompliance with other medical treatment and regimen due to unspecified reason: Secondary | ICD-10-CM | POA: Insufficient documentation

## 2011-06-02 DIAGNOSIS — I120 Hypertensive chronic kidney disease with stage 5 chronic kidney disease or end stage renal disease: Secondary | ICD-10-CM | POA: Diagnosis present

## 2011-06-02 DIAGNOSIS — Z7902 Long term (current) use of antithrombotics/antiplatelets: Secondary | ICD-10-CM | POA: Insufficient documentation

## 2011-06-02 DIAGNOSIS — J449 Chronic obstructive pulmonary disease, unspecified: Secondary | ICD-10-CM | POA: Insufficient documentation

## 2011-06-02 DIAGNOSIS — Z79899 Other long term (current) drug therapy: Secondary | ICD-10-CM | POA: Insufficient documentation

## 2011-06-02 DIAGNOSIS — N186 End stage renal disease: Secondary | ICD-10-CM | POA: Diagnosis present

## 2011-06-02 DIAGNOSIS — K449 Diaphragmatic hernia without obstruction or gangrene: Secondary | ICD-10-CM | POA: Insufficient documentation

## 2011-06-02 DIAGNOSIS — F319 Bipolar disorder, unspecified: Secondary | ICD-10-CM | POA: Diagnosis present

## 2011-06-02 DIAGNOSIS — R110 Nausea: Secondary | ICD-10-CM | POA: Insufficient documentation

## 2011-06-02 DIAGNOSIS — J4489 Other specified chronic obstructive pulmonary disease: Secondary | ICD-10-CM | POA: Insufficient documentation

## 2011-06-02 LAB — BASIC METABOLIC PANEL
BUN: 21 mg/dL (ref 6–23)
Calcium: 11.1 mg/dL — ABNORMAL HIGH (ref 8.4–10.5)
GFR calc Af Amer: 15 mL/min — ABNORMAL LOW (ref >60)
GFR calc non Af Amer: 8 mL/min — ABNORMAL LOW (ref >60)
GFR calc non Af Amer: 13 mL/min — ABNORMAL LOW (ref >60)
Glucose, Bld: 111 mg/dL — ABNORMAL HIGH (ref 70–99)
Potassium: 3.9 mEq/L (ref 3.5–5.1)
Sodium: 136 mEq/L (ref 135–145)

## 2011-06-02 LAB — DIFFERENTIAL
Basophils Relative: 0 % (ref 0–1)
Eosinophils Absolute: 0.3 10*3/uL (ref 0.0–0.7)
Eosinophils Relative: 4 % (ref 0–5)
Monocytes Absolute: 0.3 10*3/uL (ref 0.1–1.0)
Monocytes Relative: 5 % (ref 3–12)

## 2011-06-02 LAB — CBC
MCH: 27.5 pg (ref 26.0–34.0)
MCHC: 32.5 g/dL (ref 30.0–36.0)
Platelets: 184 10*3/uL (ref 150–400)

## 2011-06-02 LAB — GLUCOSE, CAPILLARY

## 2011-06-02 LAB — CARDIAC PANEL(CRET KIN+CKTOT+MB+TROPI)
CK, MB: 3.1 ng/mL (ref 0.3–4.0)
CK, MB: 2.5 ng/mL (ref 0.3–4.0)
Troponin I: <0.3 ng/mL (ref <0.30)

## 2011-06-02 LAB — POCT I-STAT, CHEM 8
BUN: 33 mg/dL — ABNORMAL HIGH (ref 6–23)
Calcium, Ion: 1.19 mmol/L (ref 1.12–1.32)
Creatinine, Ser: 8.4 mg/dL — ABNORMAL HIGH (ref 0.4–1.5)
TCO2: 23 mmol/L (ref 0–100)

## 2011-06-02 LAB — CK TOTAL AND CKMB (NOT AT ARMC): Relative Index: 2.3 (ref 0.0–2.5)

## 2011-06-02 LAB — MRSA PCR SCREENING: MRSA by PCR: NEGATIVE

## 2011-06-03 LAB — CBC
HCT: 28.4 % — ABNORMAL LOW (ref 39.0–52.0)
Hemoglobin: 9.2 g/dL — ABNORMAL LOW (ref 13.0–17.0)
MCH: 28 pg (ref 26.0–34.0)
MCHC: 32.4 g/dL (ref 30.0–36.0)
MCV: 86.6 fL (ref 78.0–100.0)
Platelets: 200 K/uL (ref 150–400)
RBC: 3.28 MIL/uL — ABNORMAL LOW (ref 4.22–5.81)
RDW: 17.6 % — ABNORMAL HIGH (ref 11.5–15.5)
WBC: 5.1 K/uL (ref 4.0–10.5)

## 2011-06-03 LAB — BASIC METABOLIC PANEL WITH GFR
BUN: 16 mg/dL (ref 6–23)
CO2: 32 meq/L (ref 19–32)
Calcium: 10.7 mg/dL — ABNORMAL HIGH (ref 8.4–10.5)
Chloride: 99 meq/L (ref 96–112)
Creatinine, Ser: 4.79 mg/dL — ABNORMAL HIGH (ref 0.50–1.35)
GFR calc Af Amer: 17 mL/min — ABNORMAL LOW
GFR calc non Af Amer: 14 mL/min — ABNORMAL LOW
Glucose, Bld: 99 mg/dL (ref 70–99)
Potassium: 4.3 meq/L (ref 3.5–5.1)
Sodium: 139 meq/L (ref 135–145)

## 2011-06-03 LAB — DIFFERENTIAL
Basophils Absolute: 0 K/uL (ref 0.0–0.1)
Basophils Relative: 0 % (ref 0–1)
Eosinophils Absolute: 0.2 K/uL (ref 0.0–0.7)
Eosinophils Relative: 4 % (ref 0–5)
Lymphocytes Relative: 30 % (ref 12–46)
Lymphs Abs: 1.5 K/uL (ref 0.7–4.0)
Monocytes Absolute: 0.3 K/uL (ref 0.1–1.0)
Monocytes Relative: 6 % (ref 3–12)
Neutro Abs: 3.1 K/uL (ref 1.7–7.7)
Neutrophils Relative %: 60 % (ref 43–77)

## 2011-06-03 LAB — CARDIAC PANEL(CRET KIN+CKTOT+MB+TROPI)
CK, MB: 2.4 ng/mL (ref 0.3–4.0)
Relative Index: 2.2 (ref 0.0–2.5)
Troponin I: <0.3 ng/mL (ref <0.30)

## 2011-06-04 ENCOUNTER — Inpatient Hospital Stay (HOSPITAL_COMMUNITY): Payer: Medicare Other

## 2011-06-04 LAB — DIFFERENTIAL
Basophils Absolute: 0 10*3/uL (ref 0.0–0.1)
Lymphocytes Relative: 29 % (ref 12–46)
Monocytes Absolute: 0.4 10*3/uL (ref 0.1–1.0)
Monocytes Relative: 7 % (ref 3–12)
Neutro Abs: 3.4 10*3/uL (ref 1.7–7.7)

## 2011-06-04 LAB — CBC
HCT: 29.2 % — ABNORMAL LOW (ref 39.0–52.0)
Hemoglobin: 9.3 g/dL — ABNORMAL LOW (ref 13.0–17.0)
MCH: 27.8 pg (ref 26.0–34.0)
MCHC: 31.8 g/dL (ref 30.0–36.0)
RBC: 3.34 MIL/uL — ABNORMAL LOW (ref 4.22–5.81)

## 2011-06-04 LAB — BASIC METABOLIC PANEL
BUN: 29 mg/dL — ABNORMAL HIGH (ref 6–23)
CO2: 30 mEq/L (ref 19–32)
GFR calc non Af Amer: 9 mL/min — ABNORMAL LOW (ref >60)
Glucose, Bld: 89 mg/dL (ref 70–99)
Potassium: 4.8 mEq/L (ref 3.5–5.1)

## 2011-06-05 ENCOUNTER — Emergency Department (HOSPITAL_COMMUNITY)
Admission: EM | Admit: 2011-06-05 | Discharge: 2011-06-05 | Disposition: A | Discharge status: Home / Self Care | Payer: Medicare Other | Attending: Emergency Medicine | Admitting: Emergency Medicine

## 2011-06-05 DIAGNOSIS — I509 Heart failure, unspecified: Secondary | ICD-10-CM | POA: Insufficient documentation

## 2011-06-05 DIAGNOSIS — IMO0001 Reserved for inherently not codable concepts without codable children: Secondary | ICD-10-CM | POA: Insufficient documentation

## 2011-06-05 DIAGNOSIS — F17200 Nicotine dependence, unspecified, uncomplicated: Secondary | ICD-10-CM | POA: Insufficient documentation

## 2011-06-05 DIAGNOSIS — R197 Diarrhea, unspecified: Secondary | ICD-10-CM | POA: Insufficient documentation

## 2011-06-05 DIAGNOSIS — G8929 Other chronic pain: Secondary | ICD-10-CM | POA: Insufficient documentation

## 2011-06-05 DIAGNOSIS — R109 Unspecified abdominal pain: Secondary | ICD-10-CM | POA: Insufficient documentation

## 2011-06-05 DIAGNOSIS — J4489 Other specified chronic obstructive pulmonary disease: Secondary | ICD-10-CM | POA: Insufficient documentation

## 2011-06-05 DIAGNOSIS — N186 End stage renal disease: Secondary | ICD-10-CM | POA: Insufficient documentation

## 2011-06-05 DIAGNOSIS — F259 Schizoaffective disorder, unspecified: Secondary | ICD-10-CM | POA: Insufficient documentation

## 2011-06-05 DIAGNOSIS — F609 Personality disorder, unspecified: Secondary | ICD-10-CM | POA: Insufficient documentation

## 2011-06-05 DIAGNOSIS — I120 Hypertensive chronic kidney disease with stage 5 chronic kidney disease or end stage renal disease: Secondary | ICD-10-CM | POA: Insufficient documentation

## 2011-06-05 DIAGNOSIS — J449 Chronic obstructive pulmonary disease, unspecified: Secondary | ICD-10-CM | POA: Insufficient documentation

## 2011-06-05 DIAGNOSIS — I2589 Other forms of chronic ischemic heart disease: Secondary | ICD-10-CM | POA: Insufficient documentation

## 2011-06-05 DIAGNOSIS — F411 Generalized anxiety disorder: Secondary | ICD-10-CM | POA: Insufficient documentation

## 2011-06-05 DIAGNOSIS — I2510 Atherosclerotic heart disease of native coronary artery without angina pectoris: Secondary | ICD-10-CM | POA: Insufficient documentation

## 2011-06-09 ENCOUNTER — Inpatient Hospital Stay (HOSPITAL_COMMUNITY): Admit: 2011-06-09 | Discharge: 2011-06-09 | Disposition: A | Discharge status: Home / Self Care | Payer: Medicare Other

## 2011-06-09 ENCOUNTER — Inpatient Hospital Stay (HOSPITAL_COMMUNITY)
Admission: EM | Admit: 2011-06-09 | Discharge: 2011-06-11 | DRG: 917 | Disposition: A | Discharge status: Home / Self Care | Payer: Medicare Other | Attending: Family Medicine | Admitting: Family Medicine

## 2011-06-09 DIAGNOSIS — N186 End stage renal disease: Secondary | ICD-10-CM | POA: Diagnosis present

## 2011-06-09 DIAGNOSIS — T43502A Poisoning by unspecified antipsychotics and neuroleptics, intentional self-harm, initial encounter: Secondary | ICD-10-CM | POA: Diagnosis present

## 2011-06-09 DIAGNOSIS — Z992 Dependence on renal dialysis: Secondary | ICD-10-CM

## 2011-06-09 DIAGNOSIS — J4489 Other specified chronic obstructive pulmonary disease: Secondary | ICD-10-CM | POA: Diagnosis present

## 2011-06-09 DIAGNOSIS — I120 Hypertensive chronic kidney disease with stage 5 chronic kidney disease or end stage renal disease: Secondary | ICD-10-CM | POA: Diagnosis present

## 2011-06-09 DIAGNOSIS — F319 Bipolar disorder, unspecified: Secondary | ICD-10-CM | POA: Diagnosis present

## 2011-06-09 DIAGNOSIS — T424X4A Poisoning by benzodiazepines, undetermined, initial encounter: Principal | ICD-10-CM | POA: Diagnosis present

## 2011-06-09 DIAGNOSIS — J449 Chronic obstructive pulmonary disease, unspecified: Secondary | ICD-10-CM | POA: Diagnosis present

## 2011-06-09 DIAGNOSIS — N039 Chronic nephritic syndrome with unspecified morphologic changes: Secondary | ICD-10-CM | POA: Diagnosis present

## 2011-06-09 DIAGNOSIS — I509 Heart failure, unspecified: Secondary | ICD-10-CM | POA: Diagnosis present

## 2011-06-09 DIAGNOSIS — E875 Hyperkalemia: Secondary | ICD-10-CM | POA: Diagnosis not present

## 2011-06-09 DIAGNOSIS — D631 Anemia in chronic kidney disease: Secondary | ICD-10-CM | POA: Diagnosis present

## 2011-06-09 LAB — BLOOD GAS, ARTERIAL
Acid-base deficit: 0.6 mmol/L (ref 0.0–2.0)
Bicarbonate: 24.6 mEq/L — ABNORMAL HIGH (ref 20.0–24.0)
O2 Content: 2 L/min
TCO2: 23.6 mmol/L (ref 0–100)
pCO2 arterial: 48.4 mmHg — ABNORMAL HIGH (ref 35.0–45.0)
pCO2 arterial: 45.9 mmHg — ABNORMAL HIGH (ref 35.0–45.0)
pH, Arterial: 7.334 — ABNORMAL LOW (ref 7.350–7.450)
pO2, Arterial: 83.8 mmHg (ref 80.0–100.0)
pO2, Arterial: 76.3 mmHg — ABNORMAL LOW (ref 80.0–100.0)

## 2011-06-09 LAB — CBC
HCT: 29.7 % — ABNORMAL LOW (ref 39.0–52.0)
Hemoglobin: 9.6 g/dL — ABNORMAL LOW (ref 13.0–17.0)
MCH: 28.3 pg (ref 26.0–34.0)
MCV: 87.6 fL (ref 78.0–100.0)
RBC: 3.39 MIL/uL — ABNORMAL LOW (ref 4.22–5.81)
WBC: 5.9 10*3/uL (ref 4.0–10.5)

## 2011-06-09 LAB — BASIC METABOLIC PANEL
BUN: 40 mg/dL — ABNORMAL HIGH (ref 6–23)
CO2: 27 mEq/L (ref 19–32)
Chloride: 92 mEq/L — ABNORMAL LOW (ref 96–112)
Glucose, Bld: 99 mg/dL (ref 70–99)
Potassium: 5.1 mEq/L (ref 3.5–5.1)
Sodium: 131 mEq/L — ABNORMAL LOW (ref 135–145)

## 2011-06-09 LAB — DIFFERENTIAL
Eosinophils Absolute: 0.3 10*3/uL (ref 0.0–0.7)
Lymphocytes Relative: 25 % (ref 12–46)
Lymphs Abs: 1.5 10*3/uL (ref 0.7–4.0)
Monocytes Relative: 8 % (ref 3–12)
Neutro Abs: 3.7 10*3/uL (ref 1.7–7.7)
Neutrophils Relative %: 63 % (ref 43–77)

## 2011-06-09 LAB — MRSA PCR SCREENING: MRSA by PCR: NEGATIVE

## 2011-06-09 LAB — RAPID URINE DRUG SCREEN, HOSP PERFORMED
Amphetamines: NOT DETECTED
Benzodiazepines: NOT DETECTED

## 2011-06-09 LAB — LITHIUM LEVEL: Lithium Lvl: <0.25 mEq/L — ABNORMAL LOW (ref 0.80–1.40)

## 2011-06-10 LAB — CBC
Platelets: 198 10*3/uL (ref 150–400)
RBC: 3.31 MIL/uL — ABNORMAL LOW (ref 4.22–5.81)
RDW: 18 % — ABNORMAL HIGH (ref 11.5–15.5)
WBC: 5 10*3/uL (ref 4.0–10.5)

## 2011-06-10 LAB — DIFFERENTIAL
Basophils Relative: 0 % (ref 0–1)
Eosinophils Absolute: 0.2 10*3/uL (ref 0.0–0.7)
Eosinophils Relative: 4 % (ref 0–5)
Neutrophils Relative %: 68 % (ref 43–77)

## 2011-06-10 LAB — BASIC METABOLIC PANEL
CO2: 30 mEq/L (ref 19–32)
Calcium: 10.3 mg/dL (ref 8.4–10.5)
Chloride: 96 mEq/L (ref 96–112)
GFR calc Af Amer: 13 mL/min — ABNORMAL LOW (ref >60)
Sodium: 133 mEq/L — ABNORMAL LOW (ref 135–145)

## 2011-06-10 LAB — PHOSPHORUS: Phosphorus: 3.9 mg/dL (ref 2.3–4.6)

## 2011-06-11 ENCOUNTER — Emergency Department (HOSPITAL_COMMUNITY)
Admission: EM | Admit: 2011-06-11 | Discharge: 2011-06-11 | Disposition: A | Discharge status: Home / Self Care | Payer: Medicare Other | Attending: Emergency Medicine | Admitting: Emergency Medicine

## 2011-06-11 ENCOUNTER — Inpatient Hospital Stay (HOSPITAL_COMMUNITY): Payer: Medicare Other

## 2011-06-11 ENCOUNTER — Emergency Department (HOSPITAL_COMMUNITY): Payer: Medicare Other

## 2011-06-11 DIAGNOSIS — N186 End stage renal disease: Secondary | ICD-10-CM | POA: Insufficient documentation

## 2011-06-11 DIAGNOSIS — K219 Gastro-esophageal reflux disease without esophagitis: Secondary | ICD-10-CM | POA: Insufficient documentation

## 2011-06-11 DIAGNOSIS — I509 Heart failure, unspecified: Secondary | ICD-10-CM | POA: Insufficient documentation

## 2011-06-11 DIAGNOSIS — F411 Generalized anxiety disorder: Secondary | ICD-10-CM | POA: Insufficient documentation

## 2011-06-11 DIAGNOSIS — Z79899 Other long term (current) drug therapy: Secondary | ICD-10-CM | POA: Insufficient documentation

## 2011-06-11 DIAGNOSIS — J449 Chronic obstructive pulmonary disease, unspecified: Secondary | ICD-10-CM | POA: Insufficient documentation

## 2011-06-11 DIAGNOSIS — F329 Major depressive disorder, single episode, unspecified: Secondary | ICD-10-CM | POA: Insufficient documentation

## 2011-06-11 DIAGNOSIS — I2510 Atherosclerotic heart disease of native coronary artery without angina pectoris: Secondary | ICD-10-CM | POA: Insufficient documentation

## 2011-06-11 DIAGNOSIS — F3289 Other specified depressive episodes: Secondary | ICD-10-CM | POA: Insufficient documentation

## 2011-06-11 DIAGNOSIS — Z7982 Long term (current) use of aspirin: Secondary | ICD-10-CM | POA: Insufficient documentation

## 2011-06-11 DIAGNOSIS — Z992 Dependence on renal dialysis: Secondary | ICD-10-CM | POA: Insufficient documentation

## 2011-06-11 DIAGNOSIS — I120 Hypertensive chronic kidney disease with stage 5 chronic kidney disease or end stage renal disease: Secondary | ICD-10-CM | POA: Insufficient documentation

## 2011-06-11 DIAGNOSIS — R109 Unspecified abdominal pain: Secondary | ICD-10-CM | POA: Insufficient documentation

## 2011-06-11 DIAGNOSIS — G8929 Other chronic pain: Secondary | ICD-10-CM | POA: Insufficient documentation

## 2011-06-11 DIAGNOSIS — J4489 Other specified chronic obstructive pulmonary disease: Secondary | ICD-10-CM | POA: Insufficient documentation

## 2011-06-11 LAB — CBC
HCT: 25.6 % — ABNORMAL LOW (ref 39.0–52.0)
Hemoglobin: 8.5 g/dL — ABNORMAL LOW (ref 13.0–17.0)
MCHC: 33.2 g/dL (ref 30.0–36.0)
RDW: 18.3 % — ABNORMAL HIGH (ref 11.5–15.5)
WBC: 5.5 10*3/uL (ref 4.0–10.5)

## 2011-06-11 LAB — BASIC METABOLIC PANEL
BUN: 50 mg/dL — ABNORMAL HIGH (ref 6–23)
Chloride: 95 mEq/L — ABNORMAL LOW (ref 96–112)
GFR calc Af Amer: 9 mL/min — ABNORMAL LOW (ref >60)
GFR calc non Af Amer: 8 mL/min — ABNORMAL LOW (ref >60)
Potassium: 5.4 mEq/L — ABNORMAL HIGH (ref 3.5–5.1)
Sodium: 131 mEq/L — ABNORMAL LOW (ref 135–145)

## 2011-06-11 NOTE — Group Therapy Note (Signed)
  NAME:  LENG, MONTESDEOCA               ACCOUNT NO.:  [id number]  MEDICAL RECORD NO.:  [id number]  LOCATION:  [facility]                          FACILITY:  [facility]  PHYSICIAN:  Quintasia Theroux L. Juanetta Gosling, M.D.DATE OF BIRTH:  April 09, 1976  DATE OF PROCEDURE: DATE OF DISCHARGE:  06/05/2011                                PROGRESS NOTE   Mr. Paino says he is a patient of Dr. Delbert Harness.  Mr. Smelser says he is doing okay and has no new complaints.  [facility] but somehow that fell through and I am not quite sure exactly what happened.  This morning after discussion with the ACT team, they feel that he is not in a position that they could commit him.  He said that he would go voluntarily to the facility but when I told him that we could not get the tele psychiatry consult this weekend and that it was not felt that he was in a position where we could involuntarily commit him to go to the center, he has decided that he would rather go home after he does his dialysis.  This was felt to be okay by the ACT Team because they do not think that he is actively suicidal and he has promised me that he does not intend to hurt himself.  Exam this morning shows that he is awake and alert.  He is answering questions.  He says he is having some flank pain and my plan then is going to have dialysis this morning. After the dialysis, I have offered him the opportunity to stay and then be transferred later, but I think he is going to choose to go home. Again arrangements had been made for him to be transferred.  Somehow those arrangements fell through.  We cannot get tele psychiatry this weekend at least until the 25th.  The ACT team does not feel that he can be involuntarily committed and he is medically stable.  [facility]  I do not think this is a ideal situation by any means but I do not think there is anything  else really available at this time.     Macel Yearsley L. Juanetta Gosling, M.D.     ELH/MEDQ  D:  06/11/2011  T:  06/11/2011  Job:  [id number]  Electronically Signed by Kari Baars M.D. on 06/11/2011 10:07:18 AM

## 2011-06-12 ENCOUNTER — Emergency Department (HOSPITAL_COMMUNITY)
Admission: EM | Admit: 2011-06-12 | Discharge: 2011-06-12 | Disposition: A | Discharge status: Home / Self Care | Payer: Medicare Other | Attending: Emergency Medicine | Admitting: Emergency Medicine

## 2011-06-12 DIAGNOSIS — F1310 Sedative, hypnotic or anxiolytic abuse, uncomplicated: Secondary | ICD-10-CM | POA: Insufficient documentation

## 2011-06-12 DIAGNOSIS — F329 Major depressive disorder, single episode, unspecified: Secondary | ICD-10-CM | POA: Insufficient documentation

## 2011-06-12 DIAGNOSIS — R109 Unspecified abdominal pain: Secondary | ICD-10-CM | POA: Insufficient documentation

## 2011-06-12 DIAGNOSIS — Z79899 Other long term (current) drug therapy: Secondary | ICD-10-CM | POA: Insufficient documentation

## 2011-06-12 DIAGNOSIS — F411 Generalized anxiety disorder: Secondary | ICD-10-CM | POA: Insufficient documentation

## 2011-06-12 DIAGNOSIS — I2510 Atherosclerotic heart disease of native coronary artery without angina pectoris: Secondary | ICD-10-CM | POA: Insufficient documentation

## 2011-06-12 DIAGNOSIS — J4489 Other specified chronic obstructive pulmonary disease: Secondary | ICD-10-CM | POA: Insufficient documentation

## 2011-06-12 DIAGNOSIS — G8929 Other chronic pain: Secondary | ICD-10-CM | POA: Insufficient documentation

## 2011-06-12 DIAGNOSIS — F3289 Other specified depressive episodes: Secondary | ICD-10-CM | POA: Insufficient documentation

## 2011-06-12 DIAGNOSIS — J449 Chronic obstructive pulmonary disease, unspecified: Secondary | ICD-10-CM | POA: Insufficient documentation

## 2011-06-12 DIAGNOSIS — F209 Schizophrenia, unspecified: Secondary | ICD-10-CM | POA: Insufficient documentation

## 2011-06-12 DIAGNOSIS — Z992 Dependence on renal dialysis: Secondary | ICD-10-CM | POA: Insufficient documentation

## 2011-06-12 DIAGNOSIS — N186 End stage renal disease: Secondary | ICD-10-CM | POA: Insufficient documentation

## 2011-06-12 DIAGNOSIS — I120 Hypertensive chronic kidney disease with stage 5 chronic kidney disease or end stage renal disease: Secondary | ICD-10-CM | POA: Insufficient documentation

## 2011-06-12 DIAGNOSIS — I509 Heart failure, unspecified: Secondary | ICD-10-CM | POA: Insufficient documentation

## 2011-06-12 DIAGNOSIS — Z7982 Long term (current) use of aspirin: Secondary | ICD-10-CM | POA: Insufficient documentation

## 2011-06-14 ENCOUNTER — Emergency Department (HOSPITAL_COMMUNITY): Payer: Medicare Other

## 2011-06-14 ENCOUNTER — Emergency Department (HOSPITAL_COMMUNITY)
Admission: EM | Admit: 2011-06-14 | Discharge: 2011-06-14 | Disposition: A | Discharge status: Home / Self Care | Payer: Medicare Other | Attending: Emergency Medicine | Admitting: Emergency Medicine

## 2011-06-14 DIAGNOSIS — R059 Cough, unspecified: Secondary | ICD-10-CM | POA: Insufficient documentation

## 2011-06-14 DIAGNOSIS — J4489 Other specified chronic obstructive pulmonary disease: Secondary | ICD-10-CM | POA: Insufficient documentation

## 2011-06-14 DIAGNOSIS — F3289 Other specified depressive episodes: Secondary | ICD-10-CM | POA: Insufficient documentation

## 2011-06-14 DIAGNOSIS — N186 End stage renal disease: Secondary | ICD-10-CM | POA: Insufficient documentation

## 2011-06-14 DIAGNOSIS — R05 Cough: Secondary | ICD-10-CM | POA: Insufficient documentation

## 2011-06-14 DIAGNOSIS — Z79899 Other long term (current) drug therapy: Secondary | ICD-10-CM | POA: Insufficient documentation

## 2011-06-14 DIAGNOSIS — I2510 Atherosclerotic heart disease of native coronary artery without angina pectoris: Secondary | ICD-10-CM | POA: Insufficient documentation

## 2011-06-14 DIAGNOSIS — F209 Schizophrenia, unspecified: Secondary | ICD-10-CM | POA: Insufficient documentation

## 2011-06-14 DIAGNOSIS — I428 Other cardiomyopathies: Secondary | ICD-10-CM | POA: Insufficient documentation

## 2011-06-14 DIAGNOSIS — Z992 Dependence on renal dialysis: Secondary | ICD-10-CM | POA: Insufficient documentation

## 2011-06-14 DIAGNOSIS — G8929 Other chronic pain: Secondary | ICD-10-CM | POA: Insufficient documentation

## 2011-06-14 DIAGNOSIS — I120 Hypertensive chronic kidney disease with stage 5 chronic kidney disease or end stage renal disease: Secondary | ICD-10-CM | POA: Insufficient documentation

## 2011-06-14 DIAGNOSIS — F329 Major depressive disorder, single episode, unspecified: Secondary | ICD-10-CM | POA: Insufficient documentation

## 2011-06-14 DIAGNOSIS — I509 Heart failure, unspecified: Secondary | ICD-10-CM | POA: Insufficient documentation

## 2011-06-14 DIAGNOSIS — F411 Generalized anxiety disorder: Secondary | ICD-10-CM | POA: Insufficient documentation

## 2011-06-14 DIAGNOSIS — F17200 Nicotine dependence, unspecified, uncomplicated: Secondary | ICD-10-CM | POA: Insufficient documentation

## 2011-06-14 DIAGNOSIS — Z9861 Coronary angioplasty status: Secondary | ICD-10-CM | POA: Insufficient documentation

## 2011-06-14 DIAGNOSIS — J449 Chronic obstructive pulmonary disease, unspecified: Secondary | ICD-10-CM | POA: Insufficient documentation

## 2011-06-14 LAB — DIFFERENTIAL
Basophils Absolute: 0 10*3/uL (ref 0.0–0.1)
Basophils Relative: 0 % (ref 0–1)
Lymphocytes Relative: 25 % (ref 12–46)
Monocytes Absolute: 0.4 10*3/uL (ref 0.1–1.0)
Neutro Abs: 3.7 10*3/uL (ref 1.7–7.7)
Neutrophils Relative %: 66 % (ref 43–77)

## 2011-06-14 LAB — CBC
HCT: 27.2 % — ABNORMAL LOW (ref 39.0–52.0)
Hemoglobin: 9 g/dL — ABNORMAL LOW (ref 13.0–17.0)
MCHC: 33.1 g/dL (ref 30.0–36.0)
RBC: 3.11 MIL/uL — ABNORMAL LOW (ref 4.22–5.81)
WBC: 5.6 10*3/uL (ref 4.0–10.5)

## 2011-06-14 LAB — BASIC METABOLIC PANEL
BUN: 25 mg/dL — ABNORMAL HIGH (ref 6–23)
CO2: 27 mEq/L (ref 19–32)
Chloride: 96 mEq/L (ref 96–112)
GFR calc non Af Amer: 14 mL/min — ABNORMAL LOW (ref >60)
Glucose, Bld: 105 mg/dL — ABNORMAL HIGH (ref 70–99)
Potassium: 4.9 mEq/L (ref 3.5–5.1)
Sodium: 132 mEq/L — ABNORMAL LOW (ref 135–145)

## 2011-06-16 ENCOUNTER — Emergency Department (HOSPITAL_COMMUNITY): Payer: Medicare Other

## 2011-06-16 ENCOUNTER — Emergency Department (HOSPITAL_COMMUNITY)
Admission: EM | Admit: 2011-06-16 | Discharge: 2011-06-16 | Disposition: A | Discharge status: Home / Self Care | Payer: Medicare Other | Attending: Emergency Medicine | Admitting: Emergency Medicine

## 2011-06-16 DIAGNOSIS — R071 Chest pain on breathing: Secondary | ICD-10-CM | POA: Insufficient documentation

## 2011-06-16 DIAGNOSIS — F3289 Other specified depressive episodes: Secondary | ICD-10-CM | POA: Insufficient documentation

## 2011-06-16 DIAGNOSIS — F329 Major depressive disorder, single episode, unspecified: Secondary | ICD-10-CM | POA: Insufficient documentation

## 2011-06-16 DIAGNOSIS — Z79899 Other long term (current) drug therapy: Secondary | ICD-10-CM | POA: Insufficient documentation

## 2011-06-16 DIAGNOSIS — Z7982 Long term (current) use of aspirin: Secondary | ICD-10-CM | POA: Insufficient documentation

## 2011-06-18 ENCOUNTER — Inpatient Hospital Stay (HOSPITAL_COMMUNITY): Payer: Medicare Other

## 2011-06-18 ENCOUNTER — Inpatient Hospital Stay (HOSPITAL_COMMUNITY)
Admission: EM | Admit: 2011-06-18 | Discharge: 2011-06-21 | DRG: 291 | Disposition: A | Discharge status: Home / Self Care | Payer: Medicare Other | Attending: Family Medicine | Admitting: Family Medicine

## 2011-06-18 ENCOUNTER — Emergency Department (HOSPITAL_COMMUNITY): Payer: Medicare Other

## 2011-06-18 DIAGNOSIS — N186 End stage renal disease: Secondary | ICD-10-CM | POA: Diagnosis present

## 2011-06-18 DIAGNOSIS — I509 Heart failure, unspecified: Secondary | ICD-10-CM | POA: Diagnosis present

## 2011-06-18 DIAGNOSIS — J449 Chronic obstructive pulmonary disease, unspecified: Secondary | ICD-10-CM | POA: Diagnosis present

## 2011-06-18 DIAGNOSIS — IMO0001 Reserved for inherently not codable concepts without codable children: Principal | ICD-10-CM | POA: Diagnosis present

## 2011-06-18 DIAGNOSIS — D631 Anemia in chronic kidney disease: Secondary | ICD-10-CM | POA: Diagnosis present

## 2011-06-18 DIAGNOSIS — F319 Bipolar disorder, unspecified: Secondary | ICD-10-CM | POA: Diagnosis present

## 2011-06-18 DIAGNOSIS — N039 Chronic nephritic syndrome with unspecified morphologic changes: Secondary | ICD-10-CM | POA: Diagnosis present

## 2011-06-18 DIAGNOSIS — G2581 Restless legs syndrome: Secondary | ICD-10-CM | POA: Diagnosis present

## 2011-06-18 DIAGNOSIS — J4489 Other specified chronic obstructive pulmonary disease: Secondary | ICD-10-CM | POA: Diagnosis present

## 2011-06-18 DIAGNOSIS — Z992 Dependence on renal dialysis: Secondary | ICD-10-CM

## 2011-06-18 DIAGNOSIS — G894 Chronic pain syndrome: Secondary | ICD-10-CM | POA: Diagnosis present

## 2011-06-18 LAB — BASIC METABOLIC PANEL
BUN: 41 mg/dL — ABNORMAL HIGH (ref 6–23)
Chloride: 94 mEq/L — ABNORMAL LOW (ref 96–112)
Creatinine, Ser: 6.79 mg/dL — ABNORMAL HIGH (ref 0.50–1.35)
GFR calc non Af Amer: 9 mL/min — ABNORMAL LOW (ref >60)
Glucose, Bld: 107 mg/dL — ABNORMAL HIGH (ref 70–99)
Potassium: 4.3 mEq/L (ref 3.5–5.1)

## 2011-06-18 LAB — DIFFERENTIAL
Basophils Absolute: 0 10*3/uL (ref 0.0–0.1)
Eosinophils Relative: 2 % (ref 0–5)
Lymphocytes Relative: 13 % (ref 12–46)
Lymphs Abs: 1.1 10*3/uL (ref 0.7–4.0)
Monocytes Absolute: 0.5 10*3/uL (ref 0.1–1.0)
Neutro Abs: 6.7 10*3/uL (ref 1.7–7.7)

## 2011-06-18 LAB — CBC
HCT: 24.4 % — ABNORMAL LOW (ref 39.0–52.0)
Hemoglobin: 8.1 g/dL — ABNORMAL LOW (ref 13.0–17.0)
MCHC: 33.2 g/dL (ref 30.0–36.0)
MCV: 86.8 fL (ref 78.0–100.0)

## 2011-06-19 LAB — BASIC METABOLIC PANEL
CO2: 29 mEq/L (ref 19–32)
Calcium: 10.7 mg/dL — ABNORMAL HIGH (ref 8.4–10.5)
Creatinine, Ser: 5.75 mg/dL — ABNORMAL HIGH (ref 0.50–1.35)
GFR calc non Af Amer: 11 mL/min — ABNORMAL LOW (ref >60)
Sodium: 135 mEq/L (ref 135–145)

## 2011-06-19 LAB — CBC
MCH: 28.6 pg (ref 26.0–34.0)
MCHC: 32.8 g/dL (ref 30.0–36.0)
MCV: 87.2 fL (ref 78.0–100.0)
Platelets: 194 10*3/uL (ref 150–400)
RBC: 3.04 MIL/uL — ABNORMAL LOW (ref 4.22–5.81)
RDW: 18.5 % — ABNORMAL HIGH (ref 11.5–15.5)

## 2011-06-19 LAB — DIFFERENTIAL
Basophils Relative: 0 % (ref 0–1)
Eosinophils Absolute: 0.2 10*3/uL (ref 0.0–0.7)
Eosinophils Relative: 3 % (ref 0–5)
Lymphs Abs: 1.2 10*3/uL (ref 0.7–4.0)
Monocytes Absolute: 0.6 10*3/uL (ref 0.1–1.0)
Monocytes Relative: 8 % (ref 3–12)
Neutrophils Relative %: 70 % (ref 43–77)

## 2011-06-20 ENCOUNTER — Inpatient Hospital Stay (HOSPITAL_COMMUNITY): Payer: Medicare Other

## 2011-06-20 LAB — CBC
MCH: 28.3 pg (ref 26.0–34.0)
MCHC: 32.6 g/dL (ref 30.0–36.0)
Platelets: 174 10*3/uL (ref 150–400)
RBC: 2.97 MIL/uL — ABNORMAL LOW (ref 4.22–5.81)

## 2011-06-20 LAB — DIFFERENTIAL
Basophils Absolute: 0 10*3/uL (ref 0.0–0.1)
Basophils Relative: 0 % (ref 0–1)
Eosinophils Absolute: 0.1 10*3/uL (ref 0.0–0.7)
Monocytes Relative: 9 % (ref 3–12)
Neutro Abs: 3.7 10*3/uL (ref 1.7–7.7)
Neutrophils Relative %: 67 % (ref 43–77)

## 2011-06-20 LAB — BASIC METABOLIC PANEL
Calcium: 10.9 mg/dL — ABNORMAL HIGH (ref 8.4–10.5)
GFR calc non Af Amer: 8 mL/min — ABNORMAL LOW (ref >60)
Potassium: 4.6 mEq/L (ref 3.5–5.1)
Sodium: 132 mEq/L — ABNORMAL LOW (ref 135–145)

## 2011-06-20 LAB — PHOSPHORUS: Phosphorus: 4.3 mg/dL (ref 2.3–4.6)

## 2011-06-21 ENCOUNTER — Inpatient Hospital Stay (HOSPITAL_COMMUNITY): Payer: Medicare Other

## 2011-06-21 LAB — DIFFERENTIAL
Basophils Absolute: 0 10*3/uL (ref 0.0–0.1)
Basophils Relative: 0 % (ref 0–1)
Eosinophils Absolute: 0.1 10*3/uL (ref 0.0–0.7)
Eosinophils Relative: 3 % (ref 0–5)
Lymphocytes Relative: 28 % (ref 12–46)
Lymphs Abs: 1.6 10*3/uL (ref 0.7–4.0)
Monocytes Absolute: 0.5 10*3/uL (ref 0.1–1.0)
Monocytes Relative: 9 % (ref 3–12)
Neutro Abs: 3.4 10*3/uL (ref 1.7–7.7)
Neutrophils Relative %: 61 % (ref 43–77)

## 2011-06-21 LAB — BASIC METABOLIC PANEL
BUN: 30 mg/dL — ABNORMAL HIGH (ref 6–23)
CO2: 30 mEq/L (ref 19–32)
Calcium: 9.8 mg/dL (ref 8.4–10.5)
Chloride: 90 mEq/L — ABNORMAL LOW (ref 96–112)
Creatinine, Ser: 5.52 mg/dL — ABNORMAL HIGH (ref 0.50–1.35)
GFR calc Af Amer: 14 mL/min — ABNORMAL LOW (ref >60)
GFR calc non Af Amer: 12 mL/min — ABNORMAL LOW (ref >60)
Glucose, Bld: 97 mg/dL (ref 70–99)
Potassium: 3.9 mEq/L (ref 3.5–5.1)
Sodium: 131 mEq/L — ABNORMAL LOW (ref 135–145)

## 2011-06-21 LAB — CBC
HCT: 26.6 % — ABNORMAL LOW (ref 39.0–52.0)
Hemoglobin: 8.9 g/dL — ABNORMAL LOW (ref 13.0–17.0)
MCH: 28.8 pg (ref 26.0–34.0)
MCHC: 33.5 g/dL (ref 30.0–36.0)
MCV: 86.1 fL (ref 78.0–100.0)
Platelets: 186 10*3/uL (ref 150–400)
RBC: 3.09 MIL/uL — ABNORMAL LOW (ref 4.22–5.81)
RDW: 18.3 % — ABNORMAL HIGH (ref 11.5–15.5)
WBC: 5.7 10*3/uL (ref 4.0–10.5)

## 2011-06-21 LAB — PHOSPHORUS: Phosphorus: 4 mg/dL (ref 2.3–4.6)

## 2011-07-03 ENCOUNTER — Emergency Department (HOSPITAL_COMMUNITY)
Admission: EM | Admit: 2011-07-03 | Discharge: 2011-07-03 | Disposition: A | Discharge status: Home / Self Care | Payer: Medicare Other | Attending: Emergency Medicine | Admitting: Emergency Medicine

## 2011-07-03 ENCOUNTER — Encounter (HOSPITAL_COMMUNITY): Payer: Self-pay | Admitting: *Deleted

## 2011-07-03 DIAGNOSIS — M545 Low back pain, unspecified: Secondary | ICD-10-CM | POA: Insufficient documentation

## 2011-07-03 DIAGNOSIS — M549 Dorsalgia, unspecified: Secondary | ICD-10-CM

## 2011-07-03 DIAGNOSIS — F17200 Nicotine dependence, unspecified, uncomplicated: Secondary | ICD-10-CM | POA: Insufficient documentation

## 2011-07-03 MED ORDER — ONDANSETRON 4 MG PO TBDP
4.0000 mg | ORAL_TABLET | Freq: Once | ORAL | Status: AC
  Administered 2011-07-03: 4 mg via ORAL
  Filled 2011-07-03: qty 1

## 2011-07-03 MED ORDER — HYDROMORPHONE HCL 1 MG/ML IJ SOLN
1.0000 mg | Freq: Once | INTRAMUSCULAR | Status: AC
  Administered 2011-07-03: 1 mg via INTRAMUSCULAR
  Filled 2011-07-03: qty 1

## 2011-07-03 NOTE — ED Notes (Signed)
Back pain x4 days after moving furniture
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
## 2011-07-03 NOTE — ED Notes (Signed)
Pt sitting up on bedside. Airway patent, no acute distress noted. Alert and oriented. Meds administered per MD order and MAR. Pt requested Sprite to drink. No other needs at this time.

## 2011-07-03 NOTE — ED Notes (Signed)
Patient sitting quietly on side of bed. Airway patent. No acute distress noted. Patient given ordered medications, tolerated well. Patient given sprite per request. Call bell within reach.

## 2011-07-08 ENCOUNTER — Emergency Department (HOSPITAL_COMMUNITY)
Admission: EM | Admit: 2011-07-08 | Discharge: 2011-07-08 | Disposition: A | Discharge status: Home / Self Care | Payer: Medicare Other | Attending: Emergency Medicine | Admitting: Emergency Medicine

## 2011-07-08 ENCOUNTER — Encounter (HOSPITAL_COMMUNITY): Payer: Self-pay

## 2011-07-08 DIAGNOSIS — F17200 Nicotine dependence, unspecified, uncomplicated: Secondary | ICD-10-CM | POA: Insufficient documentation

## 2011-07-08 DIAGNOSIS — G894 Chronic pain syndrome: Secondary | ICD-10-CM | POA: Insufficient documentation

## 2011-07-08 DIAGNOSIS — S335XXA Sprain of ligaments of lumbar spine, initial encounter: Secondary | ICD-10-CM | POA: Insufficient documentation

## 2011-07-08 DIAGNOSIS — F319 Bipolar disorder, unspecified: Secondary | ICD-10-CM | POA: Insufficient documentation

## 2011-07-08 DIAGNOSIS — I509 Heart failure, unspecified: Secondary | ICD-10-CM | POA: Insufficient documentation

## 2011-07-08 DIAGNOSIS — I10 Essential (primary) hypertension: Secondary | ICD-10-CM | POA: Insufficient documentation

## 2011-07-08 DIAGNOSIS — X500XXA Overexertion from strenuous movement or load, initial encounter: Secondary | ICD-10-CM | POA: Insufficient documentation

## 2011-07-08 DIAGNOSIS — F209 Schizophrenia, unspecified: Secondary | ICD-10-CM | POA: Insufficient documentation

## 2011-07-08 LAB — CBC
HCT: 34.3 % — ABNORMAL LOW (ref 39.0–52.0)
Hemoglobin: 11.5 g/dL — ABNORMAL LOW (ref 13.0–17.0)
RBC: 3.89 MIL/uL — ABNORMAL LOW (ref 4.22–5.81)
RDW: 17.1 % — ABNORMAL HIGH (ref 11.5–15.5)
WBC: 7.8 10*3/uL (ref 4.0–10.5)

## 2011-07-08 LAB — BASIC METABOLIC PANEL
BUN: 34 mg/dL — ABNORMAL HIGH (ref 6–23)
Chloride: 91 mEq/L — ABNORMAL LOW (ref 96–112)
GFR calc Af Amer: 13 mL/min — ABNORMAL LOW (ref >60)
Potassium: 5.4 mEq/L — ABNORMAL HIGH (ref 3.5–5.1)

## 2011-07-08 MED ORDER — METHOCARBAMOL 500 MG PO TABS
1000.0000 mg | ORAL_TABLET | Freq: Once | ORAL | Status: AC
  Administered 2011-07-08: 1000 mg via ORAL
  Filled 2011-07-08: qty 2

## 2011-07-08 MED ORDER — METHOCARBAMOL 500 MG PO TABS: ORAL_TABLET | ORAL | Status: DC

## 2011-07-08 NOTE — ED Notes (Signed)
Pt presents with low back pain. Pt states he was lifting a refrigerator and hurt his back. Pt states injury occurred 07/03/2011. Pt ambulatory to triage.

## 2011-07-08 NOTE — ED Notes (Signed)
Pt states he hurt his back moving a fridge

## 2011-07-08 NOTE — ED Notes (Signed)
Waiting to be reeval and dissposition

## 2011-07-08 NOTE — Progress Notes (Signed)
Pt make aware of abnormal bmet. Pt to notify his renal MD at dialysis. Pain improving  after Robaxin. Pt in no distress.

## 2011-07-08 NOTE — H&P (Signed)
  NAME:  [name], [name]               ACCOUNT NO.:  [id number]  MEDICAL RECORD NO.:  [id number]  LOCATION:                                 FACILITY:  PHYSICIAN:  Jeanene Mena D. Felecia Shelling, MD   DATE OF BIRTH:  1976/09/23  DATE OF ADMISSION: DATE OF DISCHARGE:  LH                             HISTORY & PHYSICAL   CHIEF COMPLAINT:  Shortness of breath.  HISTORY OF PRESENT ILLNESS:  This is a 35 years old male patient on hemodialysis came to emergency room with above complaints.  The patient has been in and out of the hospital almost every week.  He claims that he became short of breath.  He has hemodialysis scheduled for today. However, the patient has not __________ for his hemodialysis.  He came to emergency room and evaluated.  His chest x-ray showed pulmonary edema.  The patient is admitted for further treatment and for his hemodialysis.  REVIEW OF SYSTEMS:  The patient complains of back pain and lower extremity pain.  He has no fever, chills, chest pain, nausea, vomiting, or abdominal pain.  PAST MEDICAL HISTORY: 1. History of recurrent congestive heart failure. 2. End-stage renal failure on hemodialysis. 3. History of possible drug over dose. 4. Hypertension. 5. Bipolar disease. 6. Chronic pain syndrome. 7. Restless legs syndrome. 8. Anemia. 9. Hyperphosphatemia. 10.Hyperlipidemia.  MEDICATIONS: 1. Hydralazine 50 mg p.o. t.i.d. 2. Labetalol 800 mg b.i.d. 3. Nephro-Vite 1 tablet daily. 4. Amlodipine 10 mg daily. 5. Dexilant 60 mg daily. 6. Promethazine 12.5 mg as needed. 7. Plavix 75 mg daily. 8. Aspirin 325 mg daily. 9. Crestor 20 mg daily. 10.Zyprexa 10 mg daily. 11.ReQuip 1 tablet at bedtime. 12.Cymbalta 60 mg daily. 13.Lisinopril 20 mg b.i.d.  SOCIAL HISTORY:  The patient smokes tobacco.  No history of alcohol or substance abuse.  FAMILY HISTORY:  Mother had history of renal failure.  PHYSICAL EXAMINATION:  GENERAL:  The patient is alert, awake, and chronically  sick looking. VITAL SIGNS:  Blood pressure 161/77, pulse 83, respiratory rate 97, and temperature 99 degrees Fahrenheit.  HEENT:  Pupils are equal and reactive. NECK:  Supple. CHEST:  Decreased air entry, few rhonchi. CARDIOVASCULAR:  First and second heart sounds heard.  There are no murmur, no gallop. ABDOMEN:  Soft and lax.  Bowel sound is positive.  No masses or organomegaly. EXTREMITIES:  No leg edema.  ASSESSMENT: 1. Recurrent pulmonary edema secondary to end-stage renal failure. 2. Hypertension. 3. Bipolar disease. 4. Chronic pain syndrome. 5. History of restless leg. 6. Anemia.  PLAN:  Continue the patient on current medications.  We will do Nephrology consult and arrange emergent hemodialysis.  We will continue regular supportive care.     Naidelin Gugliotta D. Felecia Shelling, MD     TDF/MEDQ  D:  06/18/2011  T:  06/18/2011  Job:  [id number]  Electronically Signed by Avon Gully MD on 07/08/2011 07:09:17 AM
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
## 2011-07-09 NOTE — ED Provider Notes (Signed)
Medical screening examination/treatment/procedure(s) were performed by non-physician practitioner and as supervising physician I was immediately available for consultation/collaboration. Devoria Albe, MD, Armando Gang   Ward Givens, MD 07/09/11 [phone]

## 2011-07-11 ENCOUNTER — Encounter (HOSPITAL_COMMUNITY): Payer: Self-pay | Admitting: Emergency Medicine

## 2011-07-11 ENCOUNTER — Emergency Department (HOSPITAL_COMMUNITY)
Admission: EM | Admit: 2011-07-11 | Discharge: 2011-07-11 | Disposition: A | Discharge status: Home / Self Care | Payer: Medicare Other | Attending: Emergency Medicine | Admitting: Emergency Medicine

## 2011-07-11 DIAGNOSIS — X500XXA Overexertion from strenuous movement or load, initial encounter: Secondary | ICD-10-CM | POA: Insufficient documentation

## 2011-07-11 DIAGNOSIS — F209 Schizophrenia, unspecified: Secondary | ICD-10-CM | POA: Insufficient documentation

## 2011-07-11 DIAGNOSIS — F17200 Nicotine dependence, unspecified, uncomplicated: Secondary | ICD-10-CM | POA: Insufficient documentation

## 2011-07-11 DIAGNOSIS — I509 Heart failure, unspecified: Secondary | ICD-10-CM | POA: Insufficient documentation

## 2011-07-11 DIAGNOSIS — I10 Essential (primary) hypertension: Secondary | ICD-10-CM | POA: Insufficient documentation

## 2011-07-11 DIAGNOSIS — G894 Chronic pain syndrome: Secondary | ICD-10-CM | POA: Insufficient documentation

## 2011-07-11 DIAGNOSIS — F319 Bipolar disorder, unspecified: Secondary | ICD-10-CM | POA: Insufficient documentation

## 2011-07-11 DIAGNOSIS — S335XXA Sprain of ligaments of lumbar spine, initial encounter: Secondary | ICD-10-CM | POA: Insufficient documentation

## 2011-07-11 MED ORDER — ONDANSETRON 4 MG PO TBDP
4.0000 mg | ORAL_TABLET | Freq: Once | ORAL | Status: AC
  Administered 2011-07-11: 4 mg via ORAL
  Filled 2011-07-11: qty 1

## 2011-07-11 MED ORDER — HYDROMORPHONE HCL 1 MG/ML IJ SOLN
1.0000 mg | Freq: Once | INTRAMUSCULAR | Status: AC
  Administered 2011-07-11: 1 mg via INTRAMUSCULAR
  Filled 2011-07-11: qty 1

## 2011-07-11 NOTE — ED Notes (Signed)
Lower back pain for 2 weeks, states that he hurts his back moving a refrigerator, cms intact all extremities,
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
## 2011-07-11 NOTE — ED Notes (Signed)
Pt c/o lower back pain and has been seen here multiple times for the same. Pt c/o lower back pain x 2 weeks.

## 2011-07-12 NOTE — ED Provider Notes (Signed)
Medical screening examination/treatment/procedure(s) were performed by non-physician practitioner and as supervising physician I was immediately available for consultation/collaboration.   Charles B. Sheldon, MD 07/12/11 1404 

## 2011-07-13 NOTE — Consult Note (Signed)
[name], [name]               ACCOUNT NO.:  [id number]  MEDICAL RECORD NO.:  [id number]  LOCATION:                                 FACILITY:  PHYSICIAN:  Jorja Loa, M.D.DATE OF BIRTH:  11-03-1976  DATE OF CONSULTATION: DATE OF DISCHARGE:                                CONSULTATION   REASON FOR CONSULTATION:  End-stage renal disease for hemodialysis. [name] is a patient who has history of end-stage renal disease, on hemodialysis, noncompliant with his diet, medications, fluid, and dialysis.  He was here about a week ago because of CHF.  Presently, he was brought to the emergency room after taking unknown quantity of Xanax.  Presently, the patient seems to be somewhat arousable.  He said he only took 3 of them and it was last night.  However, according to the information, it was given by family members.  He has taken more than that.  He denies any shortness of breath, dizziness, or lightheadedness.  PAST MEDICAL HISTORY: 1. He has history of recurrent CHF because of as stated above     noncompliant to his dialysis and fluid intake. 2. History of possible drug overdose.  He is somnolent, but most of     the time we come to the dialysis unit he does the same thing.  He     has been taking Xanax and probably other medications also.  He has     a history of also polysubstance abuse. 3. History of end-stage renal disease.  He is on hemodialysis on     Tuesday, Thursday, and Saturday.  He is due for dialysis today. 4. History of hypertension. 5. History of bipolar disorder. 6. History of chronic pain syndrome. 7. History of restless legs syndrome. 8. History of anemia. 9. History of hyperphosphatemia. 10.History of hyperlipidemia.  MEDICATIONS: 1. Xanax 0.5 mg p.o. t.i.d. 2. Carvedilol 12.5 mg p.o. b.i.d. 3. Sensipar 30 mg p.o. daily. 4. Clonidine 0.2 mg p.o. b.i.d. 5. He is also getting Epogen 10,000 units IV. 6. Labetalol 800 mg p.o. b.i.d. 7. Lisinopril 10 mg  p.o. daily. 8. Crestor 5 mg p.o. daily. 9. Renagel, he is getting 800 mg about 4 tablets p.o. t.i.d. as an     outpatient. 10.Amlodipine 10 mg p.o. daily. 11.Plavix 75 mg p.o. daily. 12.Cymbalta 60 mg p.o. daily. 13.Neurontin 300 mg t.i.d. 14.Hydralazine 50 mg p.o. t.i.d. 15.Zyprexa 10 mg p.o. b.i.d. 16.Ropinirole 2 mg p.o. at bedtime. 17.Rena-Vite 1 tablet p.o. daily.  SOCIAL HISTORY:  He has a history of smoking.  Presently, denies any history of alcohol abuse or any illicit drug use.  FAMILY HISTORY:  There is some family history of renal failure, his mom had, but etiology not clear.  REVIEW OF SYSTEMS:  Very difficult to get more history, but the patient is somnolent as stated above, he is arousable.  He denies any nausea or vomiting.  As stated above, he took about 3 Xanax according to him, but according to his mother he took more than 19.  He denies any nausea, no vomiting, no shortness of breath, dizziness, or lightheadedness.  PHYSICAL EXAMINATION:  VITAL SIGNS:  Blood pressure is 150/70.  Pulse of  60.  He is afebrile.  Respiratory rate is 16. HEENT::  No conjunctival pallor.  No icterus.  Oral mucosa seems to be dry. GENERAL:  He seems to be somnolent, but arousable. CHEST:  Clear to auscultation.  No rales, no rhonchi, no egophony. HEART:  Regular rate and rhythm.  No murmur, no S3. ABDOMEN:  Soft, positive bowel sounds. EXTREMITIES:  No edema.  BLOOD WORK:  His white blood cell count is 5.9, hemoglobin 9.6, hematocrit 29.7, and platelet of 207.  His blood gas, pH of 7.327, pCO2 of 48.4, and O2 saturation is 96.1.  His sodium is 131, potassium is 5.1, BUN is 40, creatinine is 8.13, and calcium is 11.1.  ASSESSMENT: 1. End-stage renal disease.  He is due for dialysis today.  At this     moment, he does not have any nausea or vomiting.  His potassium is     also okay. 2. History of hypercalcemia.  Calcium seems to be high.  He was on     Sensipar, but he does not  take most of his medications. 3. History of drug overdose.  The patient as stated above seems to be     somnolent, but most of the time when he comes to dialysis he does     the same thing.  He took unknown quantities because of that.  He is     arousable and stable. 4. History of hypertension.  Blood pressure seems to be controlled     very well. 5. History of anemia.  He is on Epogen. 6. History of hypercalcemia. 7. History of bipolar disorder. 8. 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 9. History of recurrent congestive heart failure.  Today, he looks     somewhat better.  Usually, he drinks lots of fluid. 10.History of hyperphosphatemia.  The patient is on a binder.  At this     point, we do not have any phosphorus level.  RECOMMENDATIONS:  We will make arrangement for the patient to get dialysis as he is due for dialysis today.  Probably, we will try to get about 3-1/2 liters depending on his blood pressure.  We will check his phosphorus in the morning.  We will continue with his antihypertensive medications.  Also, we will check his phosphorus if it is high. Probably, we will consider putting him on a binder.  I will continue his Sensipar.     Jorja Loa, M.D.     BB/MEDQ  D:  06/09/2011  T:  06/10/2011  Job:  [id number]  Electronically Signed by Jorja Loa M.D. on 07/13/2011 04:33:03 PM

## 2011-07-13 NOTE — Consult Note (Signed)
NAME:  [name], [name]               ACCOUNT NO.:  [id number]  MEDICAL RECORD NO.:  [id number]  LOCATION:  [facility]                          FACILITY:  [facility]  PHYSICIAN:  Jorja Loa, M.D.DATE OF BIRTH:  February 03, 1976  DATE OF CONSULTATION:  06/02/2011 DATE OF DISCHARGE:  06/02/2011                                CONSULTATION   REASON FOR CONSULTATION:  CHF and end-stage renal disease.  HISTORY OF PRESENT ILLNESS:  This is one of multiple admissions for [name] who has multiple past medical histories including history of hypertension, end-stage renal disease on maintenance hemodialysis, noncompliant with fluid and also with dialysis presently came with shortness of breath and he was found to have CHF.  He was sent to the dialysis unit.  He was dialyzed up to 2 hours, then he started complaining of chest pain, and presently sent here to the hospital. Presently, he is admitted for chest pain but since he has also still sign of fluid overload and incomplete dialysis, consult was called.  The patient still complains of shortness of breath and also some orthopnea. He also complains of chest pain mainly on the left side without significant radiation.  He denies any nausea or vomiting.  PAST MEDICAL HISTORY: 1. History of recurrent CHF, mainly as stated above because of     noncompliance with his diet and also noncompliance with his     dialysis.  At times, he comes in and get dialysis for 2 hours and     sign off. 2. History of end-stage renal disease.  He is on dialysis Tuesday,     Thursday, Saturday.  He got 2 hours of dialysis today. 3. History of coronary artery disease status post stent placement. 4. History of hypertension. 5. History of bipolar disorder. 6. History of restless legs syndrome. 7. History of chronic back pain and pain medication seeking behavior. 8. History of anemia, mainly because of not getting enough Epogen     because he missed his dialysis, thought to be  secondary to chronic     renal failure. 9. History of dyslipidemia. 10.History of hyperphosphatemia.  CURRENT MEDICATIONS: 1. Norvasc 10 mg p.o. daily. 2. Ecotrin 325 mg p.o. daily. 3. Catapres 0.2 mg p.o. b.i.d. 4. Plavix 75 mg p.o. daily. 5. Cymbalta 60 mg p.o. daily. 6. Hydralazine 50 mg p.o. t.i.d. 7. Normodyne 800 mg p.o. b.i.d. 8. Prinivil 20 mg p.o. daily. 9. Zyprexa 10 mg p.o. daily. 10.Protonix 40 mg p.o. daily. 11.Rena-Vite 1 tablet p.o. daily. 12.Crestor 20 mg p.o. daily. 13.Neurontin 300 mg p.o. daily. 14.He is also getting Percocet 1 tablet on p.r.n. basis.  ALLERGIES:  He is allergic to IBUPROFEN, NAPROSYN, LITHIUM, PHENYTOIN, METHADONE, PRIMIDONE, SIMVASTATIN, and KETOROLAC.  SOCIAL HISTORY:  He has a history of smoking.  He has history of occasional drug use, recently denies.  He denies any alcohol abuse.  REVIEW OF SYSTEMS:  As stated above, mainly shortness of breath, orthopnea, and presently also he has some chest pain on the left side without significant radiation.  Described as pressure like.  He denies any fevers, chills, or sweating.  PHYSICAL EXAMINATION:  VITAL SIGNS:  When he was examined, blood pressure was  200/176, heart rate of 112, and temperature 98.4. CHEST:  He has respiratory crackles and also some rhonchi.  He has little bit of wheezing. HEART:  Regular rate and rhythm.  No murmur.  No S3. ABDOMEN:  Soft.  Positive bowel sounds. EXTREMITIES:  He has 1+ edema.  PROBLEMS: 1. End-stage renal disease, he is status post partial dialysis.     Potassium is 3.9, BUN is 21, and creatinine 5.24. 2. History of chest pain.  This was also a recurrent problem.  His     initial cardiac enzymes seems to be negative.  He has coronary     artery disease status post stent placement. 3. Anemia.  His hemoglobin is 9.6, hematocrit 28.6.  He is on Epogen     and stable. 4. History of hypertension.  The patient is on Norvasc, he is supposed     to be on  labetalol and lisinopril.  Unfortunately, it is very     difficult to know what he is taking and what he is not, although     this time he has been checked. 5. History of bipolar disorder. 6. History of congestive heart failure from noncompliance with his     dialysis and also his diet.  He occasionally comes with 10-12 kg     and he leaves without finishing his dialysis. 7. Bipolar disorder. 8. Chronic pain of the back.  He has pain medication seeking behavior. 9. History of gastroesophageal reflux disease. 10.History of hyperphosphatemia.  RECOMMENDATIONS:  I will do dialysis today since he has already 2 hours, we will use 3 hours and we will try to get another 2 L.  We will use 3K and 2.5 calcium bath.  We will continue his other medications.     Jorja Loa, M.D.     BB/MEDQ  D:  06/03/2011  T:  06/03/2011  Job:  [id number]  Electronically Signed by Jorja Loa M.D. on 07/13/2011 04:33:00 PM

## 2011-07-15 ENCOUNTER — Emergency Department (HOSPITAL_COMMUNITY)
Admission: EM | Admit: 2011-07-15 | Discharge: 2011-07-15 | Disposition: A | Discharge status: Home / Self Care | Payer: Medicare Other | Attending: Emergency Medicine | Admitting: Emergency Medicine

## 2011-07-15 ENCOUNTER — Encounter (HOSPITAL_COMMUNITY): Payer: Self-pay | Admitting: *Deleted

## 2011-07-15 DIAGNOSIS — I10 Essential (primary) hypertension: Secondary | ICD-10-CM | POA: Insufficient documentation

## 2011-07-15 DIAGNOSIS — F17200 Nicotine dependence, unspecified, uncomplicated: Secondary | ICD-10-CM | POA: Insufficient documentation

## 2011-07-15 DIAGNOSIS — F209 Schizophrenia, unspecified: Secondary | ICD-10-CM | POA: Insufficient documentation

## 2011-07-15 DIAGNOSIS — G894 Chronic pain syndrome: Secondary | ICD-10-CM | POA: Insufficient documentation

## 2011-07-15 DIAGNOSIS — I509 Heart failure, unspecified: Secondary | ICD-10-CM | POA: Insufficient documentation

## 2011-07-15 DIAGNOSIS — F319 Bipolar disorder, unspecified: Secondary | ICD-10-CM | POA: Insufficient documentation

## 2011-07-15 DIAGNOSIS — R51 Headache: Secondary | ICD-10-CM

## 2011-07-15 MED ORDER — OXYCODONE-ACETAMINOPHEN 5-325 MG PO TABS
1.0000 | ORAL_TABLET | Freq: Once | ORAL | Status: AC
  Administered 2011-07-15: 1 via ORAL
  Filled 2011-07-15: qty 1

## 2011-07-15 MED ORDER — METOCLOPRAMIDE HCL 5 MG/ML IJ SOLN
10.0000 mg | Freq: Once | INTRAMUSCULAR | Status: AC
  Administered 2011-07-15: 10 mg via INTRAMUSCULAR
  Filled 2011-07-15: qty 2

## 2011-07-15 MED ORDER — DIPHENHYDRAMINE HCL 50 MG/ML IJ SOLN
25.0000 mg | Freq: Once | INTRAMUSCULAR | Status: AC
  Administered 2011-07-15: 25 mg via INTRAMUSCULAR
  Filled 2011-07-15: qty 1

## 2011-07-15 NOTE — ED Notes (Signed)
edp in with pt 

## 2011-07-15 NOTE — ED Notes (Signed)
meds given.  nad 

## 2011-07-15 NOTE — ED Notes (Signed)
Pt awaiting re eval and d/c. No change.
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
## 2011-07-15 NOTE — ED Notes (Signed)
Migraine began yesterday. Nausea. Denies vomiting.

## 2011-07-15 NOTE — ED Notes (Signed)
Pt states pain meds did not help.EDP aware.

## 2011-07-17 ENCOUNTER — Other Ambulatory Visit: Payer: Self-pay

## 2011-07-17 ENCOUNTER — Encounter (HOSPITAL_COMMUNITY): Payer: Self-pay

## 2011-07-17 ENCOUNTER — Inpatient Hospital Stay (HOSPITAL_COMMUNITY)
Admission: EM | Admit: 2011-07-17 | Discharge: 2011-07-19 | DRG: 189 | Disposition: A | Discharge status: Home / Self Care | Payer: Medicare Other | Attending: Emergency Medicine | Admitting: Emergency Medicine

## 2011-07-17 ENCOUNTER — Emergency Department (HOSPITAL_COMMUNITY): Payer: Medicare Other

## 2011-07-17 DIAGNOSIS — Z992 Dependence on renal dialysis: Secondary | ICD-10-CM

## 2011-07-17 DIAGNOSIS — I5040 Unspecified combined systolic (congestive) and diastolic (congestive) heart failure: Secondary | ICD-10-CM | POA: Diagnosis present

## 2011-07-17 DIAGNOSIS — R0989 Other specified symptoms and signs involving the circulatory and respiratory systems: Secondary | ICD-10-CM | POA: Diagnosis present

## 2011-07-17 DIAGNOSIS — N186 End stage renal disease: Secondary | ICD-10-CM | POA: Diagnosis present

## 2011-07-17 DIAGNOSIS — E785 Hyperlipidemia, unspecified: Secondary | ICD-10-CM | POA: Diagnosis present

## 2011-07-17 DIAGNOSIS — Z9861 Coronary angioplasty status: Secondary | ICD-10-CM

## 2011-07-17 DIAGNOSIS — J810 Acute pulmonary edema: Principal | ICD-10-CM | POA: Diagnosis present

## 2011-07-17 DIAGNOSIS — F259 Schizoaffective disorder, unspecified: Secondary | ICD-10-CM | POA: Diagnosis present

## 2011-07-17 DIAGNOSIS — R0603 Acute respiratory distress: Secondary | ICD-10-CM

## 2011-07-17 DIAGNOSIS — J4489 Other specified chronic obstructive pulmonary disease: Secondary | ICD-10-CM | POA: Diagnosis present

## 2011-07-17 DIAGNOSIS — Z91199 Patient's noncompliance with other medical treatment and regimen due to unspecified reason: Secondary | ICD-10-CM

## 2011-07-17 DIAGNOSIS — I10 Essential (primary) hypertension: Secondary | ICD-10-CM

## 2011-07-17 DIAGNOSIS — J449 Chronic obstructive pulmonary disease, unspecified: Secondary | ICD-10-CM

## 2011-07-17 DIAGNOSIS — F319 Bipolar disorder, unspecified: Secondary | ICD-10-CM | POA: Diagnosis present

## 2011-07-17 DIAGNOSIS — I509 Heart failure, unspecified: Secondary | ICD-10-CM | POA: Diagnosis present

## 2011-07-17 DIAGNOSIS — I5043 Acute on chronic combined systolic (congestive) and diastolic (congestive) heart failure: Secondary | ICD-10-CM | POA: Diagnosis present

## 2011-07-17 DIAGNOSIS — N289 Disorder of kidney and ureter, unspecified: Secondary | ICD-10-CM

## 2011-07-17 DIAGNOSIS — G2581 Restless legs syndrome: Secondary | ICD-10-CM | POA: Diagnosis present

## 2011-07-17 DIAGNOSIS — J811 Chronic pulmonary edema: Secondary | ICD-10-CM

## 2011-07-17 DIAGNOSIS — I120 Hypertensive chronic kidney disease with stage 5 chronic kidney disease or end stage renal disease: Secondary | ICD-10-CM | POA: Diagnosis present

## 2011-07-17 DIAGNOSIS — Z9119 Patient's noncompliance with other medical treatment and regimen: Secondary | ICD-10-CM

## 2011-07-17 DIAGNOSIS — I2510 Atherosclerotic heart disease of native coronary artery without angina pectoris: Secondary | ICD-10-CM | POA: Diagnosis present

## 2011-07-17 HISTORY — DX: End stage renal disease: N18.6

## 2011-07-17 LAB — BASIC METABOLIC PANEL
CO2: 17 mEq/L — ABNORMAL LOW (ref 19–32)
Glucose, Bld: 61 mg/dL — ABNORMAL LOW (ref 70–99)
Potassium: 5.1 mEq/L (ref 3.5–5.1)
Sodium: 133 mEq/L — ABNORMAL LOW (ref 135–145)

## 2011-07-17 LAB — DIFFERENTIAL
Basophils Absolute: 0 10*3/uL (ref 0.0–0.1)
Lymphocytes Relative: 18 % (ref 12–46)
Lymphs Abs: 1.2 10*3/uL (ref 0.7–4.0)
Neutro Abs: 5 10*3/uL (ref 1.7–7.7)
Neutrophils Relative %: 76 % (ref 43–77)

## 2011-07-17 LAB — CBC
Platelets: 235 10*3/uL (ref 150–400)
RBC: 3.25 MIL/uL — ABNORMAL LOW (ref 4.22–5.81)
WBC: 6.6 10*3/uL (ref 4.0–10.5)

## 2011-07-17 LAB — PROTIME-INR
INR: 1.18 (ref 0.00–1.49)
Prothrombin Time: 15.3 seconds — ABNORMAL HIGH (ref 11.6–15.2)

## 2011-07-17 LAB — PRO B NATRIURETIC PEPTIDE: Pro B Natriuretic peptide (BNP): >70000 pg/mL — ABNORMAL HIGH (ref 0–125)

## 2011-07-17 LAB — GLUCOSE, CAPILLARY: Glucose-Capillary: 113 mg/dL — ABNORMAL HIGH (ref 70–99)

## 2011-07-17 MED ORDER — ENALAPRILAT 1.25 MG/ML IV SOLN
1.2500 mg | Freq: Once | INTRAVENOUS | Status: AC
  Administered 2011-07-17: 1.25 mg via INTRAVENOUS
  Filled 2011-07-17: qty 2

## 2011-07-17 MED ORDER — ALPRAZOLAM 0.5 MG PO TABS
0.5000 mg | ORAL_TABLET | Freq: Every evening | ORAL | Status: DC | PRN
  Administered 2011-07-17: 0.5 mg via ORAL
  Filled 2011-07-17: qty 1

## 2011-07-17 MED ORDER — CALCIUM GLUCONATE 10 % IV SOLN
INTRAVENOUS | Status: AC
  Filled 2011-07-17: qty 20

## 2011-07-17 MED ORDER — SODIUM CHLORIDE 0.9 % IV SOLN
INTRAVENOUS | Status: AC
  Filled 2011-07-17: qty 200

## 2011-07-17 MED ORDER — ALBUTEROL SULFATE (5 MG/ML) 0.5% IN NEBU
2.5000 mg | INHALATION_SOLUTION | Freq: Once | RESPIRATORY_TRACT | Status: AC
  Administered 2011-07-17: 2.5 mg via RESPIRATORY_TRACT
  Filled 2011-07-17: qty 0.5

## 2011-07-17 MED ORDER — PANTOPRAZOLE SODIUM 40 MG PO TBEC
80.0000 mg | DELAYED_RELEASE_TABLET | Freq: Every day | ORAL | Status: DC
  Administered 2011-07-17 – 2011-07-19 (×3): 80 mg via ORAL
  Filled 2011-07-17 (×3): qty 2

## 2011-07-17 MED ORDER — DIPHENHYDRAMINE HCL 50 MG/ML IJ SOLN: 12.5000 mg | Freq: Four times a day (QID) | INTRAMUSCULAR | Status: DC | PRN

## 2011-07-17 MED ORDER — ROPINIROLE HCL 1 MG PO TABS
ORAL_TABLET | ORAL | Status: AC
  Filled 2011-07-17: qty 2

## 2011-07-17 MED ORDER — NITROGLYCERIN IN D5W 200-5 MCG/ML-% IV SOLN
5.0000 ug/min | Freq: Once | INTRAVENOUS | Status: AC
  Administered 2011-07-17: 20 ug/min via INTRAVENOUS
  Filled 2011-07-17: qty 250

## 2011-07-17 MED ORDER — IPRATROPIUM BROMIDE 0.02 % IN SOLN
0.5000 mg | Freq: Once | RESPIRATORY_TRACT | Status: AC
  Administered 2011-07-17: 0.5 mg via RESPIRATORY_TRACT
  Filled 2011-07-17: qty 2.5

## 2011-07-17 MED ORDER — ASPIRIN 81 MG PO CHEW
81.0000 mg | CHEWABLE_TABLET | Freq: Every day | ORAL | Status: DC
  Administered 2011-07-18 – 2011-07-19 (×2): 81 mg via ORAL
  Filled 2011-07-17 (×2): qty 1

## 2011-07-17 MED ORDER — DULOXETINE HCL 60 MG PO CPEP
60.0000 mg | ORAL_CAPSULE | Freq: Every day | ORAL | Status: DC
  Administered 2011-07-17 – 2011-07-19 (×2): 60 mg via ORAL
  Filled 2011-07-17 (×3): qty 1

## 2011-07-17 MED ORDER — SODIUM CHLORIDE 0.9 % IV SOLN
INTRAVENOUS | Status: DC
  Administered 2011-07-18: 21:00:00 via INTRAVENOUS

## 2011-07-17 MED ORDER — FUROSEMIDE 10 MG/ML IJ SOLN
40.0000 mg | Freq: Two times a day (BID) | INTRAMUSCULAR | Status: DC
  Administered 2011-07-17 – 2011-07-19 (×4): 40 mg via INTRAVENOUS
  Filled 2011-07-17 (×4): qty 4

## 2011-07-17 MED ORDER — HYDRALAZINE HCL 25 MG PO TABS
50.0000 mg | ORAL_TABLET | Freq: Three times a day (TID) | ORAL | Status: DC
  Administered 2011-07-17 – 2011-07-19 (×6): 50 mg via ORAL
  Filled 2011-07-17 (×7): qty 2

## 2011-07-17 MED ORDER — ROPINIROLE HCL 1 MG PO TABS
2.0000 mg | ORAL_TABLET | Freq: Every day | ORAL | Status: DC
  Administered 2011-07-17 – 2011-07-18 (×2): 2 mg via ORAL
  Filled 2011-07-17 (×3): qty 2

## 2011-07-17 MED ORDER — ENOXAPARIN SODIUM 40 MG/0.4ML ~~LOC~~ SOLN
40.0000 mg | SUBCUTANEOUS | Status: DC
  Administered 2011-07-17 – 2011-07-18 (×2): 40 mg via SUBCUTANEOUS
  Filled 2011-07-17 (×2): qty 0.4

## 2011-07-17 MED ORDER — CLOPIDOGREL BISULFATE 75 MG PO TABS
75.0000 mg | ORAL_TABLET | Freq: Every day | ORAL | Status: DC
  Administered 2011-07-18 – 2011-07-19 (×2): 75 mg via ORAL
  Filled 2011-07-17 (×2): qty 1

## 2011-07-17 MED ORDER — OXYCODONE-ACETAMINOPHEN 5-325 MG PO TABS
1.0000 | ORAL_TABLET | ORAL | Status: DC | PRN
  Administered 2011-07-17 – 2011-07-19 (×6): 1 via ORAL
  Filled 2011-07-17 (×6): qty 1

## 2011-07-17 MED ORDER — HYDROMORPHONE HCL 1 MG/ML IJ SOLN
INTRAMUSCULAR | Status: AC
  Administered 2011-07-17: 1 mg via INTRAVENOUS
  Filled 2011-07-17: qty 1

## 2011-07-17 MED ORDER — OLANZAPINE 5 MG PO TABS
10.0000 mg | ORAL_TABLET | Freq: Every day | ORAL | Status: DC
  Administered 2011-07-17 – 2011-07-18 (×2): 10 mg via ORAL
  Filled 2011-07-17 (×2): qty 2

## 2011-07-17 MED ORDER — AMLODIPINE BESYLATE 5 MG PO TABS
10.0000 mg | ORAL_TABLET | Freq: Every day | ORAL | Status: DC
  Administered 2011-07-17 – 2011-07-19 (×3): 10 mg via ORAL
  Filled 2011-07-17 (×4): qty 2

## 2011-07-17 MED ORDER — CLONAZEPAM 0.5 MG PO TABS
1.0000 mg | ORAL_TABLET | Freq: Two times a day (BID) | ORAL | Status: DC
  Administered 2011-07-17 – 2011-07-19 (×4): 1 mg via ORAL
  Filled 2011-07-17 (×4): qty 2

## 2011-07-17 MED ORDER — NITROGLYCERIN IN D5W 200-5 MCG/ML-% IV SOLN
2.0000 ug/min | INTRAVENOUS | Status: DC
  Administered 2011-07-17: 200 ug/min via INTRAVENOUS
  Administered 2011-07-17 – 2011-07-18 (×2): 150 ug/min via INTRAVENOUS
  Filled 2011-07-17 (×3): qty 250

## 2011-07-17 MED ORDER — METHYLPREDNISOLONE SODIUM SUCC 125 MG IJ SOLR
125.0000 mg | Freq: Once | INTRAMUSCULAR | Status: AC
  Administered 2011-07-17: 125 mg via INTRAVENOUS
  Filled 2011-07-17: qty 2

## 2011-07-17 MED ORDER — ACETAMINOPHEN 500 MG PO TABS
1000.0000 mg | ORAL_TABLET | Freq: Once | ORAL | Status: AC
  Administered 2011-07-17: 1000 mg via ORAL
  Filled 2011-07-17: qty 2

## 2011-07-17 MED ORDER — ROSUVASTATIN CALCIUM 20 MG PO TABS
20.0000 mg | ORAL_TABLET | Freq: Every day | ORAL | Status: DC
  Administered 2011-07-17 – 2011-07-19 (×3): 20 mg via ORAL
  Filled 2011-07-17 (×3): qty 1

## 2011-07-17 MED ORDER — HYDROMORPHONE HCL 1 MG/ML IJ SOLN
1.0000 mg | Freq: Once | INTRAMUSCULAR | Status: AC
  Administered 2011-07-17: 1 mg via INTRAVENOUS

## 2011-07-17 MED ORDER — LISINOPRIL 10 MG PO TABS
20.0000 mg | ORAL_TABLET | Freq: Every day | ORAL | Status: DC
  Administered 2011-07-17 – 2011-07-19 (×3): 20 mg via ORAL
  Filled 2011-07-17 (×3): qty 2

## 2011-07-17 MED ORDER — NEPHRO-VITE 0.8 MG PO TABS
1.0000 | ORAL_TABLET | Freq: Every day | ORAL | Status: DC
  Administered 2011-07-17 – 2011-07-18 (×2): 1 via ORAL
  Filled 2011-07-17 (×2): qty 1

## 2011-07-17 MED ORDER — PANTOPRAZOLE SODIUM 40 MG PO TBEC: 40.0000 mg | DELAYED_RELEASE_TABLET | Freq: Every day | ORAL | Status: DC

## 2011-07-17 MED ORDER — METHOCARBAMOL 500 MG PO TABS: 1000.0000 mg | ORAL_TABLET | Freq: Three times a day (TID) | ORAL | Status: DC | PRN

## 2011-07-17 MED ORDER — LABETALOL HCL 200 MG PO TABS
200.0000 mg | ORAL_TABLET | Freq: Two times a day (BID) | ORAL | Status: DC
  Administered 2011-07-17: 200 mg via ORAL
  Filled 2011-07-17 (×3): qty 1

## 2011-07-17 MED ORDER — SODIUM CHLORIDE 0.9 % IV SOLN
2.0000 g | Freq: Once | INTRAVENOUS | Status: AC
  Administered 2011-07-17: 2 g via INTRAVENOUS
  Filled 2011-07-17: qty 20

## 2011-07-17 MED ORDER — ASPIRIN 81 MG PO CHEW
324.0000 mg | CHEWABLE_TABLET | Freq: Once | ORAL | Status: AC
  Administered 2011-07-17: 324 mg via ORAL
  Filled 2011-07-17: qty 4

## 2011-07-17 MED ORDER — HYDRALAZINE HCL 20 MG/ML IJ SOLN
20.0000 mg | Freq: Once | INTRAMUSCULAR | Status: AC
  Administered 2011-07-17: 20 mg via INTRAVENOUS
  Filled 2011-07-17: qty 1

## 2011-07-17 MED ORDER — FUROSEMIDE 10 MG/ML IJ SOLN
40.0000 mg | Freq: Once | INTRAMUSCULAR | Status: AC
  Administered 2011-07-17: 40 mg via INTRAVENOUS
  Filled 2011-07-17: qty 4

## 2011-07-17 MED ORDER — NITROGLYCERIN 0.4 MG/SPRAY TL SOLN
1.0000 | Freq: Once | Status: AC
  Administered 2011-07-17: 1 via SUBLINGUAL

## 2011-07-17 MED ORDER — NITROGLYCERIN 0.4 MG SL SUBL
0.4000 mg | SUBLINGUAL_TABLET | Freq: Once | SUBLINGUAL | Status: AC
  Administered 2011-07-17: 0.4 mg via SUBLINGUAL
  Filled 2011-07-17: qty 25

## 2011-07-17 MED ORDER — METOPROLOL TARTRATE 1 MG/ML IV SOLN
5.0000 mg | Freq: Once | INTRAVENOUS | Status: AC
  Administered 2011-07-17: 5 mg via INTRAVENOUS
  Filled 2011-07-17: qty 5

## 2011-07-17 MED ORDER — NITROGLYCERIN IN D5W 200-5 MCG/ML-% IV SOLN
INTRAVENOUS | Status: AC
  Administered 2011-07-18: 150 ug/min via INTRAVENOUS
  Filled 2011-07-17: qty 250

## 2011-07-17 MED ORDER — DEXTROSE 50 % IV SOLN
12.5000 g | Freq: Once | INTRAVENOUS | Status: AC
  Administered 2011-07-17: 12.5 g via INTRAVENOUS

## 2011-07-17 MED ORDER — DEXTROSE 50 % IV SOLN
INTRAVENOUS | Status: AC
  Administered 2011-07-17: 12.5 g via INTRAVENOUS
  Filled 2011-07-17: qty 50

## 2011-07-17 NOTE — ED Notes (Signed)
Respiratory on the way for treatment and bi-pap

## 2011-07-17 NOTE — ED Notes (Signed)
No change in pt condition. Nitro gtt increased to 133mcg/min

## 2011-07-17 NOTE — ED Notes (Signed)
Md at bedside. Increased nitro gtt to 182mcg/min per md order.

## 2011-07-17 NOTE — ED Notes (Signed)
No change in pt condition. Nitro gtt increased to 163mcg/min

## 2011-07-17 NOTE — ED Notes (Signed)
Resting quietly in bed. Bipap in place. Pt continues to rate cp 8/10. Nitro gtt increased to 171mcg/min

## 2011-07-17 NOTE — ED Notes (Signed)
Pt has been up all night with difficulty breathing.  Pt has wheezing in both lungs.  Pt is stable, alert and oriented.  Pt c/o of pain to his arms and back.

## 2011-07-17 NOTE — ED Notes (Addendum)
Pt denies change in pain. Continues to rate CP 8/10. Nitro gtt increased to 48mcg/min.
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
## 2011-07-17 NOTE — ED Notes (Signed)
Pt states chest pain has worsened to 9/10. MD aware. Nitro 0.4 sublingually administered.

## 2011-07-17 NOTE — ED Notes (Signed)
Care assumed. Pt currently on bipap. Complaining of chest pain mid sternal 8/10. Nitro gtt increased to 65mcg/min per [employer]. 2nd INT attempted x 4. Unsuccessful.

## 2011-07-17 NOTE — ED Notes (Signed)
No change from previous assessment. Pt A&Ox3. Skin w/p/d. Pt continues to complain of chest pain 8/10 but states he feels better than when he got here. Denies feeling SOB at this time. Bipap in place. Awaitng admission.

## 2011-07-17 NOTE — ED Notes (Signed)
No relief in pain. Rates CP 8/10. Nitro gtt increased to 59mcg/min.

## 2011-07-17 NOTE — ED Notes (Signed)
Pt denies relief in chest pain from sublingual nitro. Nitro gtt continues to infuse at 252mcg/min. Bipap in place. Admission orders received. Awaiting bed assignment.

## 2011-07-17 NOTE — ED Notes (Signed)
Pt reports chest pain unchanged at 8/10. Nitro currently infusing at 261mcg/min. Pt denies feeling sob. Bipap in place. Skin w/p/d.

## 2011-07-17 NOTE — ED Notes (Signed)
CBG results 113. Md notified. No new orders at this time

## 2011-07-17 NOTE — ED Notes (Signed)
Pt denies relief in pain. Continues to rate cp 8/10. BP remains elevated. At 188/101. Md at bedside. NTG gtt increased to 235mcg/min

## 2011-07-17 NOTE — ED Notes (Signed)
No change in pt condition. Continues to complain of chest pain non radiating 9/10. Pt sitting up on side of bed. Bipap in place. Awaiting bed assignment.
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
## 2011-07-17 NOTE — ED Notes (Signed)
Continues to rate chest pain 8/10. States shortness of breath has improved with bipap. MD aware of pt BP. Nitro gtt increased to 160 mcg/min

## 2011-07-17 NOTE — ED Notes (Signed)
Pt brought in by EMS for SOB and Wheezing. Pt tripoding and wheezing throughout. Albuterol Tx x 2 by EMS and NTG x 1 spray by EMS.

## 2011-07-18 ENCOUNTER — Inpatient Hospital Stay (HOSPITAL_COMMUNITY): Payer: Medicare Other

## 2011-07-18 MED ORDER — SODIUM CHLORIDE 0.9 % IV SOLN: 100.0000 mL | INTRAVENOUS | Status: DC | PRN

## 2011-07-18 MED ORDER — ROPINIROLE HCL 1 MG PO TABS
ORAL_TABLET | ORAL | Status: AC
  Filled 2011-07-18: qty 2

## 2011-07-18 MED ORDER — LIDOCAINE HCL (PF) 1 % IJ SOLN: 5.0000 mL | INTRAMUSCULAR | Status: DC | PRN

## 2011-07-18 MED ORDER — HEPARIN SODIUM (PORCINE) 1000 UNIT/ML IJ SOLN
500.0000 [IU] | INTRAMUSCULAR | Status: DC
  Administered 2011-07-18 (×3): 500 [IU] via INTRAVENOUS

## 2011-07-18 MED ORDER — HEPARIN SODIUM (PORCINE) 1000 UNIT/ML IJ SOLN
1000.0000 [IU] | Freq: Once | INTRAMUSCULAR | Status: DC
  Administered 2011-07-18: 1000 [IU] via INTRAVENOUS

## 2011-07-18 MED ORDER — LABETALOL HCL 200 MG PO TABS
200.0000 mg | ORAL_TABLET | Freq: Two times a day (BID) | ORAL | Status: DC
  Administered 2011-07-18 (×2): 200 mg via ORAL
  Filled 2011-07-18 (×2): qty 1

## 2011-07-18 MED ORDER — HEPARIN SODIUM (PORCINE) 1000 UNIT/ML IJ SOLN: 1000.0000 [IU] | Freq: Once | INTRAMUSCULAR | Status: DC

## 2011-07-18 MED ORDER — NEPRO/CARBSTEADY PO LIQD: 237.0000 mL | ORAL | Status: DC | PRN

## 2011-07-18 MED ORDER — SODIUM CHLORIDE 0.9 % IJ SOLN
INTRAMUSCULAR | Status: AC
  Administered 2011-07-18: 10 mL
  Filled 2011-07-18: qty 10

## 2011-07-18 MED ORDER — LIDOCAINE-PRILOCAINE 2.5-2.5 % EX CREA: 1.0000 "application " | TOPICAL_CREAM | CUTANEOUS | Status: DC | PRN

## 2011-07-18 MED ORDER — PENTAFLUOROPROP-TETRAFLUOROETH EX AERO: 1.0000 "application " | INHALATION_SPRAY | CUTANEOUS | Status: DC | PRN

## 2011-07-18 NOTE — Progress Notes (Signed)
AM meds delayed d/t pt receiving dialysis

## 2011-07-18 NOTE — Progress Notes (Signed)
[id number] 
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
## 2011-07-18 NOTE — Plan of Care (Signed)
Problem: Limited Adherence to Nutrition-Related Recommendations (NB-1.6) Goal: ABSENCE OF EXCESS WEIGHT GAIN R/T FLUID RETENTION Intervention: Diet, fluid restriction Variance: Patient Uncooperative

## 2011-07-18 NOTE — Procedures (Signed)
Pt torerated tx well.  Report given to [employer].

## 2011-07-19 ENCOUNTER — Emergency Department (HOSPITAL_COMMUNITY)
Admission: EM | Admit: 2011-07-19 | Discharge: 2011-07-19 | Disposition: A | Discharge status: Home / Self Care | Payer: Medicare Other | Attending: Emergency Medicine | Admitting: Emergency Medicine

## 2011-07-19 ENCOUNTER — Encounter (HOSPITAL_COMMUNITY): Payer: Self-pay | Admitting: Emergency Medicine

## 2011-07-19 DIAGNOSIS — W010XXA Fall on same level from slipping, tripping and stumbling without subsequent striking against object, initial encounter: Secondary | ICD-10-CM | POA: Insufficient documentation

## 2011-07-19 DIAGNOSIS — F17200 Nicotine dependence, unspecified, uncomplicated: Secondary | ICD-10-CM | POA: Insufficient documentation

## 2011-07-19 DIAGNOSIS — I509 Heart failure, unspecified: Secondary | ICD-10-CM | POA: Insufficient documentation

## 2011-07-19 DIAGNOSIS — F319 Bipolar disorder, unspecified: Secondary | ICD-10-CM | POA: Insufficient documentation

## 2011-07-19 DIAGNOSIS — G894 Chronic pain syndrome: Secondary | ICD-10-CM | POA: Insufficient documentation

## 2011-07-19 DIAGNOSIS — I5043 Acute on chronic combined systolic (congestive) and diastolic (congestive) heart failure: Secondary | ICD-10-CM | POA: Diagnosis present

## 2011-07-19 DIAGNOSIS — M5430 Sciatica, unspecified side: Secondary | ICD-10-CM

## 2011-07-19 DIAGNOSIS — F209 Schizophrenia, unspecified: Secondary | ICD-10-CM | POA: Insufficient documentation

## 2011-07-19 LAB — BASIC METABOLIC PANEL
BUN: 27 mg/dL — ABNORMAL HIGH (ref 6–23)
CO2: 25 mEq/L (ref 19–32)
Calcium: 10.2 mg/dL (ref 8.4–10.5)
Creatinine, Ser: 6.35 mg/dL — ABNORMAL HIGH (ref 0.50–1.35)
Glucose, Bld: 126 mg/dL — ABNORMAL HIGH (ref 70–99)

## 2011-07-19 LAB — PHOSPHORUS: Phosphorus: 4.2 mg/dL (ref 2.3–4.6)

## 2011-07-19 MED ORDER — LABETALOL HCL 200 MG PO TABS
300.0000 mg | ORAL_TABLET | Freq: Two times a day (BID) | ORAL | Status: DC
  Administered 2011-07-19: 300 mg via ORAL
  Filled 2011-07-19: qty 2

## 2011-07-19 MED ORDER — HYDROCODONE-ACETAMINOPHEN 5-325 MG PO TABS
2.0000 | ORAL_TABLET | Freq: Once | ORAL | Status: AC
  Administered 2011-07-19: 2 via ORAL

## 2011-07-19 MED ORDER — AMLODIPINE BESYLATE 10 MG PO TABS: 10.0000 mg | ORAL_TABLET | Freq: Every day | ORAL | Status: DC

## 2011-07-19 MED ORDER — HYDROCODONE-ACETAMINOPHEN 5-325 MG PO TABS: 1.0000 | ORAL_TABLET | Freq: Four times a day (QID) | ORAL | Status: DC | PRN

## 2011-07-19 NOTE — Consult Note (Signed)
bI have reviewed the patient's current medications.jective: Patient for this moment offers no complaints he denies any nausea vomiting. Patient also does not have any chest pain no orthopnea no paroxysmal nocturnal dyspnea. His appetite is good.           Physical Exam: The vital signs GEX:BMWU:  [97.9 F (36.6 C)-98.3 F (36.8 C)] 98 F (36.7 C) (07/31 0800) Pulse Rate:  [38-95] 83  (07/31 0811) Resp:  [11-26] 15  (07/31 0811) BP: (123-192)/(62-143) 185/143 mmHg (07/31 0800) SpO2:  [91 %-100 %] 100 % (07/31 0811) Weight:  [84.1 kg (185 lb 6.5 oz)-88.6 kg (195 lb 5.2 oz)] 185 lb 6.5 oz (84.1 kg) (07/31 0500) The patient is alert in no apparent distress Her chest is clear to auscultation he does have and rest no rhonchi no egophony His heart exam revealed regular rate and rhythm has/6 systolic ejection murmur no history.  Abdomen soft positive bowel sound nontender no palpable organomegaly. Extremities he does have any edema.  Investigations: Results for orders placed during the hospital encounter of 07/17/11 (from the past 24 hour(s))  PHOSPHORUS     Status: Normal   Collection Time   07/19/11  5:38 AM      Component Value Range   Phosphorus 4.2  2.3 - 4.6 (mg/dL)  BASIC METABOLIC PANEL     Status: Abnormal   Collection Time   07/19/11  5:38 AM      Component Value Range   Sodium 135  135 - 145 (mEq/L)   Potassium 4.1  3.5 - 5.1 (mEq/L)   Chloride 96  96 - 112 (mEq/L)   CO2 25  19 - 32 (mEq/L)   Glucose, Bld 126 (*) 70 - 99 (mg/dL)   BUN 27 (*) 6 - 23 (mg/dL)   Creatinine, Ser 1.32 (*) 0.50 - 1.35 (mg/dL)   Calcium 44.0  8.4 - 10.5 (mg/dL)   GFR calc non Af Amer 10 (*) >60 (mL/min)   GFR calc Af Amer 12 (*) >60 (mL/min)   Recent Results (from the past 240 hour(s))  MRSA PCR SCREENING     Status: Normal   Collection Time   07/17/11  7:20 PM      Component Value Range Status Comment   MRSA by PCR NEGATIVE  NEGATIVE  Final        Medications: I have reviewed  the patient's current medications.  Impression: Problem #1 end-stage renal disease status post hemodialysis yesterday again the creatinine was in acceptable range. Problem #2 history of hypertension her pressure seems to be somewhat high. Problem #3 a history of anemia his hemoglobin is 9.6 hematocrit 28.5 on Epogen H&H stable. Problem #4 history of chest pain presently seems to be feeling better and his cardiac enzymes are normal. Problem #5 bipolar disorder Problem #6 history of CHF from noncompliance was died and also his dialysis he status post ultrafiltration of 4 L presently feeling better. Problem #7 history of hyperphosphatemia he is in a binder Problem #8 history of coronary artery disease Problem #9 history of her chronic pain syndrome.     Plan: Recommendations I would put his present management we'll make arrangements for patient to get dialysis tomorrow It is good to be discharged it will be done as an outpatient.  Reason for followup thank you We'll increase his labetalol.     LOS: 2 days   [facility] S 07/19/2011, 8:33 AM

## 2011-07-19 NOTE — ED Notes (Signed)
C/o lower back pain s/p fall; states slipped in shower and fell; denies LOC

## 2011-07-19 NOTE — ED Notes (Signed)
Pt c/o rt sided lower back pain since tonight.

## 2011-07-19 NOTE — Discharge Summary (Signed)
[id number] 

## 2011-07-19 NOTE — Progress Notes (Signed)
NAME:  [name], PARA NO.:  [id number]  MEDICAL RECORD NO.:  [id number]  LOCATION:  [facility]09                          FACILITY:  [facility]  PHYSICIAN:  Melvyn Novas, MDDATE OF BIRTH:  Aug 05, 1976  DATE OF PROCEDURE: DATE OF DISCHARGE:                                PROGRESS NOTE   The patient is a 35 year old male with chronic noncompliance, accelerated hypertension, end-stage renal disease, ischemic cardiomyopathy, ejection 40%, two-vessel coronary artery disease, chronic renal failure, end-stage renal disease, COPD, bipolarity, and schizophrenia.  Again, the patient was noncompliant and arrives to the hospital with blood pressure 216/130 with pulmonary vascular congestion and he was placed on IV nitroglycerin with subsequent reduction in his pressures.  He was given hemodialysis this morning.  His pressures are down to 155/92, temperature is 98.2, pulse is 74 and regular, respiratory rate is 12, O2 sat is 98% room air, hemoglobin is 9.6 which is normal for him, creatinine 7.61 chronically elevated, total intake was 1140 and total output was 154 dialysis.  This was positive fluid balance of 879 mL.  The patient denies any anginal equivalents, denies orthopnea, PND, stressed the need for him to stay for today's dialysis and tomorrow's regular dialysis as he cut short again on Saturday. Lungs show scattered rhonchi, prolonged respiratory phase, diminished breath sounds at the bases.  Heart is regular rhythm.  No murmurs, gallops, heaves, thrills, or rubs.  Abdomen is soft, nontender.  Bowel sounds are normoactive.  Extremities, trace to 1+ pedal edema.  PLAN:  At present continue all current medicines, continue dialysis today and tomorrow, consider discharge tomorrow with a BMET and CBC in a.m., monitor hemodynamics.     Melvyn Novas, MD     RMD/MEDQ  D:  07/18/2011  T:  07/19/2011  Job:  [id number]

## 2011-07-19 NOTE — ED Notes (Signed)
A&ox4; in no distress; ambulatory with steady gait; left in c/o family for transport home

## 2011-07-19 NOTE — Progress Notes (Signed)
Pt to be d/c per MD order. All d/c instructions gone over with pt. All questions and concerns answered. Pt d/c home with family member.
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
## 2011-07-20 NOTE — Discharge Summary (Signed)
NAME:  [name], [name] NO.:  [id number]  MEDICAL RECORD NO.:  [id number]  LOCATION:  [facility]09                          FACILITY:  [facility]  PHYSICIAN:  Melvyn Novas, MDDATE OF BIRTH:  30-Mar-1976  DATE OF ADMISSION:  07/17/2011 DATE OF DISCHARGE:  07/31/2012LH                              DISCHARGE SUMMARY   HISTORY OF PRESENT ILLNESS:  [name] is a 35 year old gentleman with multiple medical problems which were all acute on chronic. 1. Chronic noncompliance. 2. Schizophrenia. 3. Bipolarity. 4. End-stage renal disease, on dialysis. 5. Coronary artery disease, two-vessel disease status post stenting. 6. Systolic and diastolic heart failure, ejection fraction of 40-45%. 7. Hyperlipidemia. 8. Chronic obstructive pulmonary disease, continued smoking despite     counseling. 9. Accelerated hypertension. 10.Restless legs syndrome.  The patient was noncompliant with duration and frequency of his dialysis and probably with medicines and he came here with accelerated hypertension.  His chest x-ray is clinically consistent with volume overload.  He was admitted to ICU.  Myocardial infarction was rule out on the basis of enzymes and EKG.  He had subsequent dialysis x2, resumption of his antihypertensive medical regimen, and he improved over 48-hour period.  The patient was again counseled on smoking cessation, the need and importance to take medicines, and life-threatening effects of not following orders, and following up with myself and with renal dialysis.  DISCHARGE MEDICATIONS:  The patient was subsequently discharged on the following medicines: 1. Amlodipine 10 mg p.o. daily. 2. Aspirin 81 mg p.o. daily. 3. Plavix 75 mg p.o. daily. 4. Cymbalta 60 mg p.o. daily. 5. Hydralazine 50 mg p.o. t.i.d. 6. Labetalol 200 mg p.o. b.i.d. 7. Lisinopril 20 mg p.o. daily. 8. Robaxin 500 mg p.o. t.i.d. p.r.n. 9. Zyprexa 10 mg p.o. at bedtime. 10.Proventil HFA 2  puffs q.6 h. p.r.n. 11.ReQuip 2 mg p.o. at bedtime. 12.Crestor 20 mg p.o. daily.  CONDITION ON DISCHARGE:  The patient was hemodynamically stable at the time of discharge.  No respiratory distress.  No significant clinical volume overload.  He has all of his medicines at home and he will resume these medicines.     Melvyn Novas, MD     RMD/MEDQ  D:  07/19/2011  T:  07/20/2011  Job:  [id number]

## 2011-07-24 ENCOUNTER — Inpatient Hospital Stay (HOSPITAL_COMMUNITY): Payer: Medicare Other

## 2011-07-24 ENCOUNTER — Emergency Department (HOSPITAL_COMMUNITY): Payer: Medicare Other

## 2011-07-24 ENCOUNTER — Other Ambulatory Visit: Payer: Self-pay

## 2011-07-24 ENCOUNTER — Encounter (HOSPITAL_COMMUNITY): Payer: Self-pay | Admitting: Emergency Medicine

## 2011-07-24 ENCOUNTER — Inpatient Hospital Stay (HOSPITAL_COMMUNITY)
Admission: EM | Admit: 2011-07-24 | Discharge: 2011-07-26 | DRG: 291 | Disposition: A | Discharge status: Home / Self Care | Payer: Medicare Other | Attending: Family Medicine | Admitting: Family Medicine

## 2011-07-24 DIAGNOSIS — Z91199 Patient's noncompliance with other medical treatment and regimen due to unspecified reason: Secondary | ICD-10-CM

## 2011-07-24 DIAGNOSIS — J4489 Other specified chronic obstructive pulmonary disease: Secondary | ICD-10-CM | POA: Diagnosis present

## 2011-07-24 DIAGNOSIS — R0989 Other specified symptoms and signs involving the circulatory and respiratory systems: Secondary | ICD-10-CM | POA: Diagnosis present

## 2011-07-24 DIAGNOSIS — I509 Heart failure, unspecified: Secondary | ICD-10-CM | POA: Diagnosis present

## 2011-07-24 DIAGNOSIS — IMO0001 Reserved for inherently not codable concepts without codable children: Principal | ICD-10-CM | POA: Diagnosis present

## 2011-07-24 DIAGNOSIS — I5040 Unspecified combined systolic (congestive) and diastolic (congestive) heart failure: Secondary | ICD-10-CM | POA: Diagnosis present

## 2011-07-24 DIAGNOSIS — J449 Chronic obstructive pulmonary disease, unspecified: Secondary | ICD-10-CM | POA: Diagnosis present

## 2011-07-24 DIAGNOSIS — I5043 Acute on chronic combined systolic (congestive) and diastolic (congestive) heart failure: Secondary | ICD-10-CM | POA: Diagnosis present

## 2011-07-24 DIAGNOSIS — Z992 Dependence on renal dialysis: Secondary | ICD-10-CM

## 2011-07-24 DIAGNOSIS — I161 Hypertensive emergency: Secondary | ICD-10-CM

## 2011-07-24 DIAGNOSIS — Z9119 Patient's noncompliance with other medical treatment and regimen: Secondary | ICD-10-CM

## 2011-07-24 DIAGNOSIS — N186 End stage renal disease: Secondary | ICD-10-CM | POA: Diagnosis present

## 2011-07-24 DIAGNOSIS — Z9861 Coronary angioplasty status: Secondary | ICD-10-CM

## 2011-07-24 DIAGNOSIS — F319 Bipolar disorder, unspecified: Secondary | ICD-10-CM | POA: Diagnosis present

## 2011-07-24 DIAGNOSIS — I2510 Atherosclerotic heart disease of native coronary artery without angina pectoris: Secondary | ICD-10-CM | POA: Diagnosis present

## 2011-07-24 DIAGNOSIS — I10 Essential (primary) hypertension: Secondary | ICD-10-CM | POA: Diagnosis present

## 2011-07-24 LAB — DIFFERENTIAL
Basophils Absolute: 0 10*3/uL (ref 0.0–0.1)
Eosinophils Absolute: 0.3 10*3/uL (ref 0.0–0.7)
Eosinophils Relative: 3 % (ref 0–5)
Monocytes Absolute: 0.4 10*3/uL (ref 0.1–1.0)

## 2011-07-24 LAB — BASIC METABOLIC PANEL
Calcium: 11.6 mg/dL — ABNORMAL HIGH (ref 8.4–10.5)
Creatinine, Ser: 6.64 mg/dL — ABNORMAL HIGH (ref 0.50–1.35)
GFR calc non Af Amer: 10 mL/min — ABNORMAL LOW (ref >60)
Sodium: 138 mEq/L (ref 135–145)

## 2011-07-24 LAB — CARDIAC PANEL(CRET KIN+CKTOT+MB+TROPI)
CK, MB: 3 ng/mL (ref 0.3–4.0)
Total CK: 107 U/L (ref 7–232)

## 2011-07-24 LAB — CBC
MCH: 29.6 pg (ref 26.0–34.0)
MCHC: 33.3 g/dL (ref 30.0–36.0)
MCV: 88.8 fL (ref 78.0–100.0)
Platelets: 296 10*3/uL (ref 150–400)
RDW: 17.1 % — ABNORMAL HIGH (ref 11.5–15.5)

## 2011-07-24 LAB — MRSA PCR SCREENING: MRSA by PCR: INVALID — AB

## 2011-07-24 MED ORDER — OLANZAPINE 5 MG PO TABS
10.0000 mg | ORAL_TABLET | Freq: Every day | ORAL | Status: DC
  Administered 2011-07-24: 10 mg via ORAL
  Filled 2011-07-24: qty 2
  Filled 2011-07-24: qty 1

## 2011-07-24 MED ORDER — NITROGLYCERIN IN D5W 200-5 MCG/ML-% IV SOLN
5.0000 ug/min | INTRAVENOUS | Status: DC
  Administered 2011-07-24: 5 ug/min via INTRAVENOUS
  Administered 2011-07-24: 10 ug/min via INTRAVENOUS
  Filled 2011-07-24: qty 250

## 2011-07-24 MED ORDER — PENTAFLUOROPROP-TETRAFLUOROETH EX AERO
1.0000 "application " | INHALATION_SPRAY | CUTANEOUS | Status: DC | PRN
  Filled 2011-07-24: qty 103.5

## 2011-07-24 MED ORDER — IPRATROPIUM BROMIDE 0.02 % IN SOLN
RESPIRATORY_TRACT | Status: AC
  Administered 2011-07-24: 0.5 mg
  Filled 2011-07-24: qty 2.5

## 2011-07-24 MED ORDER — HYDRALAZINE HCL 25 MG PO TABS
50.0000 mg | ORAL_TABLET | Freq: Three times a day (TID) | ORAL | Status: DC
  Administered 2011-07-24 – 2011-07-25 (×3): 50 mg via ORAL
  Filled 2011-07-24 (×2): qty 2
  Filled 2011-07-24 (×2): qty 1

## 2011-07-24 MED ORDER — LIDOCAINE HCL (PF) 1 % IJ SOLN: 5.0000 mL | INTRAMUSCULAR | Status: DC | PRN

## 2011-07-24 MED ORDER — ALBUTEROL SULFATE (5 MG/ML) 0.5% IN NEBU
INHALATION_SOLUTION | RESPIRATORY_TRACT | Status: AC
  Administered 2011-07-24: 5 mg
  Filled 2011-07-24: qty 1

## 2011-07-24 MED ORDER — DULOXETINE HCL 60 MG PO CPEP
60.0000 mg | ORAL_CAPSULE | Freq: Every day | ORAL | Status: DC
  Administered 2011-07-25 – 2011-07-26 (×2): 60 mg via ORAL
  Filled 2011-07-24 (×2): qty 1

## 2011-07-24 MED ORDER — SODIUM CHLORIDE 0.9 % IV SOLN: 100.0000 mL | INTRAVENOUS | Status: DC | PRN

## 2011-07-24 MED ORDER — LISINOPRIL 10 MG PO TABS
20.0000 mg | ORAL_TABLET | Freq: Every day | ORAL | Status: DC
  Administered 2011-07-24 – 2011-07-26 (×3): 20 mg via ORAL
  Filled 2011-07-24 (×3): qty 2

## 2011-07-24 MED ORDER — SODIUM CHLORIDE 0.9 % IV SOLN
Freq: Once | INTRAVENOUS | Status: AC
  Administered 2011-07-24: 10:00:00 via INTRAVENOUS

## 2011-07-24 MED ORDER — HEPARIN SODIUM (PORCINE) 5000 UNIT/ML IJ SOLN: 500.0000 [IU] | INTRAMUSCULAR | Status: DC

## 2011-07-24 MED ORDER — LIDOCAINE-PRILOCAINE 2.5-2.5 % EX CREA
1.0000 "application " | TOPICAL_CREAM | CUTANEOUS | Status: DC | PRN
  Filled 2011-07-24: qty 5

## 2011-07-24 MED ORDER — OLANZAPINE 10 MG IM SOLR
10.0000 mg | Freq: Once | INTRAMUSCULAR | Status: DC
  Filled 2011-07-24: qty 10

## 2011-07-24 MED ORDER — HEPARIN SODIUM (PORCINE) 1000 UNIT/ML IJ SOLN
1000.0000 [IU] | Freq: Once | INTRAMUSCULAR | Status: DC
  Administered 2011-07-24: 1000 [IU]
  Filled 2011-07-24: qty 1

## 2011-07-24 MED ORDER — LABETALOL HCL 200 MG PO TABS
300.0000 mg | ORAL_TABLET | Freq: Two times a day (BID) | ORAL | Status: DC
  Administered 2011-07-24 – 2011-07-25 (×3): 300 mg via ORAL
  Filled 2011-07-24 (×3): qty 2

## 2011-07-24 MED ORDER — NEPRO/CARBSTEADY PO LIQD: 237.0000 mL | ORAL | Status: DC | PRN

## 2011-07-24 MED ORDER — HEPARIN SODIUM (PORCINE) 5000 UNIT/ML IJ SOLN: 1000.0000 [IU] | Freq: Once | INTRAMUSCULAR | Status: DC

## 2011-07-24 MED ORDER — NITROGLYCERIN 0.4 MG SL SUBL
0.4000 mg | SUBLINGUAL_TABLET | SUBLINGUAL | Status: AC | PRN
  Administered 2011-07-24: 0.4 mg via SUBLINGUAL
  Filled 2011-07-24: qty 25
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
## 2011-07-24 NOTE — ED Notes (Signed)
Pt c/o sob today. Pt pale and diaphoretic. Respiratory therapy paged.
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
## 2011-07-24 NOTE — H&P (Signed)
[name], [place]               ACCOUNT NO.:  [id number]  MEDICAL RECORD NO.:  [id number]  LOCATION:  IC05                          FACILITY:  [facility]  PHYSICIAN:  Khaleel Beckom G. Renard Matter, MD   DATE OF BIRTH:  [birth year]  DATE OF ADMISSION:  07/24/2011 DATE OF DISCHARGE:  LH                             HISTORY & PHYSICAL   This 35 year old male who came into the ED complaining of shortness of breath today.  He has a history of end-stage renal disease and is on dialysis.  Apparently, this shortness of breath occurred approximately a week ago.  While on dialysis yesterday, had 4 liters of fluid taken off. Continues to have shortness of breath.  Chest x-ray showed diffuse air space edema which is worse with moderate cardiomegaly.  The patient actually was found to be hypertensive as well with blood pressure of being 204/134.  He was placed on a nitroglycerin drip.  He is in pulmonary edema.  Dr. Kristian Covey, Nephrology was alerted due to the fact that patient is being admitted for urgent dialysis.  SOCIAL HISTORY:  The patient smokes a pack a day.  Does not use alcohol.  FAMILY HISTORY:  No pertinent family history.  PAST MEDICAL HISTORY:  The patient has renal insufficiency, end-stage renal disease, hypertension, congestive heart failure, bipolar disorder, schizophrenia, chronic pain syndrome, tobacco abuse, left ventricular dysfunction, coronary artery disease, two-vessel disease requiring stenting previously, systolic and diastolic heart failure with ejection fraction of 40-45%, hyperlipidemia, chronic obstructive pulmonary disease, and restless legs syndrome.  PAST SURGICAL HISTORY:  Coronary angioplasty with stent placement, AV fistula placement of left arm, and esophagogastroduodenoscopy.  ALLERGIES: 1. FENTANYL. 2. IBUPROFEN. 3. METHADONE. 4. NAPROXEN. 5. SIMVASTATIN. 6. TRAMADOL. 7. KETOROLAC TROMETHAMINE.  MEDICATION LIST: 1. Albuterol in the form of Proventil 2 puffs q.6  h. as needed for     shortness of breath. 2. Amlodipine 10 mg daily. 3. Aspirin 81 mg daily. 4. Complex, vitamin C, folic acid 0.8 mg at bedtime. 5. Nephro-Vite 0.8 mg daily. 6. Plavix 75 mg daily. 7. Cymbalta 60 mg daily. 8. Hydralazine 50 mg t.i.d. 9. Hydrocodone 1 tablet every 6 hours p.r.n. for pain. 10.Labetalol 200 mg b.i.d. 11.Lisinopril 20 mg daily. 12.Zyprexa 10 mg at bedtime. 13.ReQuip 2 mg at bedtime. 14.Crestor 20 mg daily.  REVIEW OF SYSTEMS:  HEENT:  Negative.  CARDIOPULMONARY:  The patient has had shortness of breath and dyspnea most of the day.  GI:  No bowel irregularity.  GU:  The patient does not make urine due to end-stage renal disease.  PHYSICAL EXAMINATION:  VITAL SIGNS:  Acutely ill male with blood pressure 198/129, pulse 118, and respirations 38. HEENT:  Eyes:  PERRLA.  TMs negative.  Oropharynx benign. NECK:  Supple.  No JVD or thyroid abnormalities. HEART:  Regular rhythm.  No murmurs, but tachycardic. LUNGS:  Rhonchi bilaterally.  Increased respiratory rate. ABDOMEN:  No palpable organs or masses.  No organomegaly. MUSCULOSKELETAL:  No edema.  ASSESSMENT:  The patient is in pulmonary edema.  He does have end-stage renal disease on dialysis, chronic obstructive pulmonary disease and depression, history of congestive heart failure, hypertension, bipolar disorder, schizophrenia, coronary artery disease, three-vessel disease,  systolic and diastolic heart failure with ejection fraction of 40-45%.  PLAN:  To place the patient in intensive care.  Will consult with Dr. Kristian Covey, nephrologist concerning the need for urgent dialysis.     [name] G. Renard Matter, MD     AGM/MEDQ  D:  07/24/2011  T:  07/24/2011  Job:  [id number]

## 2011-07-25 MED ORDER — OLANZAPINE 5 MG PO TABS
10.0000 mg | ORAL_TABLET | Freq: Two times a day (BID) | ORAL | Status: DC
  Administered 2011-07-25 – 2011-07-26 (×3): 10 mg via ORAL
  Filled 2011-07-25 (×3): qty 2

## 2011-07-25 MED ORDER — HYDROCODONE-ACETAMINOPHEN 5-325 MG PO TABS
1.0000 | ORAL_TABLET | Freq: Three times a day (TID) | ORAL | Status: DC | PRN
  Administered 2011-07-25 – 2011-07-26 (×4): 1 via ORAL
  Filled 2011-07-25 (×4): qty 1

## 2011-07-25 MED ORDER — LABETALOL HCL 200 MG PO TABS
400.0000 mg | ORAL_TABLET | Freq: Two times a day (BID) | ORAL | Status: DC
  Administered 2011-07-25 – 2011-07-26 (×2): 400 mg via ORAL
  Filled 2011-07-25 (×2): qty 2

## 2011-07-25 MED ORDER — HYDRALAZINE HCL 25 MG PO TABS
50.0000 mg | ORAL_TABLET | Freq: Three times a day (TID) | ORAL | Status: DC
  Administered 2011-07-25 – 2011-07-26 (×4): 50 mg via ORAL
  Filled 2011-07-25 (×4): qty 2

## 2011-07-25 MED ORDER — LIDOCAINE HCL (PF) 1 % IJ SOLN: 5.0000 mL | INTRAMUSCULAR | Status: DC | PRN

## 2011-07-25 MED ORDER — PENTAFLUOROPROP-TETRAFLUOROETH EX AERO
1.0000 "application " | INHALATION_SPRAY | CUTANEOUS | Status: DC | PRN
  Filled 2011-07-25: qty 103.5

## 2011-07-25 MED ORDER — NEPRO/CARBSTEADY PO LIQD: 237.0000 mL | ORAL | Status: DC | PRN

## 2011-07-25 MED ORDER — CLOPIDOGREL BISULFATE 75 MG PO TABS
75.0000 mg | ORAL_TABLET | Freq: Every day | ORAL | Status: DC
  Administered 2011-07-26: 75 mg via ORAL
  Filled 2011-07-25: qty 1

## 2011-07-25 MED ORDER — SODIUM CHLORIDE 0.9 % IV SOLN: 100.0000 mL | INTRAVENOUS | Status: DC | PRN

## 2011-07-25 MED ORDER — HEPARIN SODIUM (PORCINE) 1000 UNIT/ML IJ SOLN
500.0000 [IU] | INTRAMUSCULAR | Status: DC
  Filled 2011-07-25 (×40): qty 0.5

## 2011-07-25 MED ORDER — HEPARIN SODIUM (PORCINE) 1000 UNIT/ML IJ SOLN: 1000.0000 [IU] | Freq: Once | INTRAMUSCULAR | Status: DC

## 2011-07-25 MED ORDER — AMLODIPINE BESYLATE 5 MG PO TABS
10.0000 mg | ORAL_TABLET | Freq: Every day | ORAL | Status: DC
  Administered 2011-07-26: 10 mg via ORAL
  Filled 2011-07-25: qty 1
  Filled 2011-07-25: qty 2

## 2011-07-25 MED ORDER — CLONIDINE HCL 0.2 MG PO TABS
0.2000 mg | ORAL_TABLET | Freq: Two times a day (BID) | ORAL | Status: DC
  Administered 2011-07-25 – 2011-07-26 (×3): 0.2 mg via ORAL
  Filled 2011-07-25 (×3): qty 1

## 2011-07-25 MED ORDER — LIDOCAINE-PRILOCAINE 2.5-2.5 % EX CREA
1.0000 "application " | TOPICAL_CREAM | CUTANEOUS | Status: DC | PRN
  Filled 2011-07-25: qty 5

## 2011-07-25 MED ORDER — AMLODIPINE BESYLATE 5 MG PO TABS
5.0000 mg | ORAL_TABLET | Freq: Every day | ORAL | Status: DC
  Administered 2011-07-25: 5 mg via ORAL

## 2011-07-25 NOTE — Progress Notes (Signed)
Nitro gtt was running at 63mcg/min; Dr. Rhodia Albright ordered the [facility] gtt to be discontinued.  Turned off at 10:55 as ordered.  Current BP 162/82.  Pt is not complaining of any pain in his chest or elsewhere.  Will continue to monitor pt closely.

## 2011-07-25 NOTE — Progress Notes (Signed)
bjective: Patient with end-stage renal disease that came with complaints of shortness of breath orthopnea and paroxysmal nocturnal dyspnea. Patient was found to have CHF presently he is status post for hemodialysis with ultrafiltration of 4 L. The patient seems to be feeling better. He denies any chest pain no nausea no vomiting and also no shortness of breath.           Physical Exam: The vital signs ZOX:WRUE:  [97.9 F (36.6 C)-98.8 F (37.1 C)] 97.9 F (36.6 C) (08/06 0500) Pulse Rate:  [47-122] 53  (08/06 0630) Resp:  [13-48] 24  (08/06 0630) BP: (143-211)/(62-140) 180/102 mmHg (08/06 0630) SpO2:  [85 %-100 %] 99 % (08/06 0630) FiO2 (%):  [40 %-50 %] 40 % (08/05 1600) Weight:  [79.7 kg (175 lb 11.3 oz)-87.2 kg (192 lb 3.9 oz)] 175 lb 11.3 oz (79.7 kg) (08/06 0500) Generally the patient is alert in no apparent distress.  Her chest clear to auscultation no rales no rhonchi no egophony Heart his heart exam revealed regular rate and rhythm no murmur S3  Abdomen soft nontender positive bowel sound is have any palpable organomegaly and Extremities he doesn't have any edema.   Investigations: Results for orders placed during the hospital encounter of 07/24/11 (from the past 24 hour([name]))  CBC     Status: Abnormal   Collection Time   07/24/11 10:13 AM      Component Value Range   WBC 9.4  4.0 - 10.5 (K/uL)   RBC 3.65 (*) 4.22 - 5.81 (MIL/uL)   Hemoglobin 10.8 (*) 13.0 - 17.0 (g/dL)   HCT 45.4 (*) 09.8 - 52.0 (%)   MCV 88.8  78.0 - 100.0 (fL)   MCH 29.6  26.0 - 34.0 (pg)   MCHC 33.3  30.0 - 36.0 (g/dL)   RDW 11.9 (*) 14.7 - 15.5 (%)   Platelets 296  150 - 400 (K/uL)  DIFFERENTIAL     Status: Abnormal   Collection Time   07/24/11 10:13 AM      Component Value Range   Neutrophils Relative 78 (*) 43 - 77 (%)   Neutro Abs 7.3  1.7 - 7.7 (K/uL)   Lymphocytes Relative 15  12 - 46 (%)   Lymphs Abs 1.4  0.7 - 4.0 (K/uL)   Monocytes Relative 4  3 - 12 (%)   Monocytes Absolute 0.4   0.1 - 1.0 (K/uL)   Eosinophils Relative 3  0 - 5 (%)   Eosinophils Absolute 0.3  0.0 - 0.7 (K/uL)   Basophils Relative 0  0 - 1 (%)   Basophils Absolute 0.0  0.0 - 0.1 (K/uL)  BASIC METABOLIC PANEL     Status: Abnormal   Collection Time   07/24/11 10:13 AM      Component Value Range   Sodium 138  135 - 145 (mEq/L)   Potassium 4.5  3.5 - 5.1 (mEq/L)   Chloride 96  96 - 112 (mEq/L)   CO2 19  19 - 32 (mEq/L)   Glucose, Bld 121 (*) 70 - 99 (mg/dL)   BUN 33 (*) 6 - 23 (mg/dL)   Creatinine, Ser 8.29 (*) 0.50 - 1.35 (mg/dL)   Calcium 56.2 (*) 8.4 - 10.5 (mg/dL)   GFR calc non Af Amer 10 (*) >60 (mL/min)   GFR calc Af Amer 12 (*) >60 (mL/min)  CARDIAC PANEL(CRET KIN+CKTOT+MB+TROPI)     Status: Abnormal   Collection Time   07/24/11 10:13 AM      Component Value  Range   Total CK 107  7 - 232 (U/L)   CK, MB 3.0  0.3 - 4.0 (ng/mL)   Troponin I <0.30  <0.30 (ng/mL)   Relative Index 2.8 (*) 0.0 - 2.5   MRSA PCR SCREENING     Status: Abnormal   Collection Time   07/24/11  3:17 PM      Component Value Range   MRSA by PCR INVALID RESULTS, SPECIMEN SENT FOR CULTURE (*) NEGATIVE    Recent Results (from the past 240 hour([name]))  MRSA PCR SCREENING     Status: Normal   Collection Time   07/17/11  7:20 PM      Component Value Range Status Comment   MRSA by PCR NEGATIVE  NEGATIVE  Final   MRSA PCR SCREENING     Status: Abnormal   Collection Time   07/24/11  3:17 PM      Component Value Range Status Comment   MRSA by PCR INVALID RESULTS, SPECIMEN SENT FOR CULTURE (*) NEGATIVE  Final        Medications: I have reviewed the patient'[name] current medications.  Impression: Problem #1 end-stage renal disease is status post hemodialysis yesterday is pending creatinine at this moment R. he has also a normal potassium. Problem #2 CHF patient noncompliance with fluid and salt intake and also occasionally misses dialysis. And he has been admitted recurrently with similar problem. Presently is feeling  better. Problem #3 history of hypertension blood pressure still not controlled. He'll start on hydralazine 50 mg by mouth 3 times a day labetalol and also nitro drip. Problem #4 bipolar disorder Problem #5 history of anemia secondary to chronic renal failure he'[name] on Epogen H&H is stable. Problem #6 history of hyperphosphatemia he is on a binder. Problem #7 history of COPD is on inhaler patient is continuously smoking.*      Plan: Recommendation we'll stop her nitro drip We'll start him on Norvasc 5 mg by mouth daily Recheck his CBC be made phosphorus in the morning We'll do hemodialysis and we'll try to remove  about 4 L. This is end of progress note thank you     LOS: 1 day   [name] 07/25/2011, 10:02 AM

## 2011-07-25 NOTE — Progress Notes (Signed)
[id number] 

## 2011-07-26 ENCOUNTER — Inpatient Hospital Stay (HOSPITAL_COMMUNITY): Payer: Medicare Other

## 2011-07-26 LAB — CBC
HCT: 27.1 % — ABNORMAL LOW (ref 39.0–52.0)
MCH: 29.5 pg (ref 26.0–34.0)
MCHC: 32.8 g/dL (ref 30.0–36.0)
RDW: 17.2 % — ABNORMAL HIGH (ref 11.5–15.5)

## 2011-07-26 LAB — BASIC METABOLIC PANEL
BUN: 44 mg/dL — ABNORMAL HIGH (ref 6–23)
Creatinine, Ser: 8.51 mg/dL — ABNORMAL HIGH (ref 0.50–1.35)
GFR calc Af Amer: 9 mL/min — ABNORMAL LOW (ref >60)
GFR calc non Af Amer: 7 mL/min — ABNORMAL LOW (ref >60)

## 2011-07-26 MED ORDER — EPOETIN ALFA 10000 UNIT/ML IJ SOLN
10000.0000 [IU] | INTRAMUSCULAR | Status: DC
  Administered 2011-07-26: 10000 [IU] via INTRAVENOUS
  Filled 2011-07-26: qty 1

## 2011-07-26 MED ORDER — CLONIDINE HCL 0.2 MG PO TABS: 0.2000 mg | ORAL_TABLET | Freq: Two times a day (BID) | ORAL | Status: DC

## 2011-07-26 MED ORDER — HEPARIN SODIUM (PORCINE) 1000 UNIT/ML IJ SOLN
1000.0000 [IU] | Freq: Once | INTRAMUSCULAR | Status: DC
  Filled 2011-07-26: qty 1

## 2011-07-26 MED ORDER — HEPARIN SODIUM (PORCINE) 1000 UNIT/ML IJ SOLN
500.0000 [IU] | INTRAMUSCULAR | Status: DC
  Filled 2011-07-26 (×33): qty 0.5

## 2011-07-26 MED ORDER — LIDOCAINE HCL (PF) 1 % IJ SOLN: 5.0000 mL | INTRAMUSCULAR | Status: DC | PRN

## 2011-07-26 MED ORDER — SODIUM CHLORIDE 0.9 % IV SOLN: 100.0000 mL | INTRAVENOUS | Status: DC | PRN

## 2011-07-26 MED ORDER — NEPRO/CARBSTEADY PO LIQD: 237.0000 mL | ORAL | Status: DC | PRN

## 2011-07-26 MED ORDER — HEPARIN SODIUM (PORCINE) 1000 UNIT/ML IJ SOLN
1000.0000 [IU] | Freq: Once | INTRAMUSCULAR | Status: AC
  Administered 2011-07-26: 1000 [IU] via INTRAVENOUS
  Filled 2011-07-26: qty 1

## 2011-07-26 NOTE — Progress Notes (Signed)
Patient given D/C instructions, and new Rx. VSS at this time. Patient going down in wheelchair to go home.

## 2011-07-26 NOTE — H&P (Addendum)
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  While in ER, he was found to be in some moderate respiratory distress.  Chest x-ray reveals increased vascularization consistent with congestive heart failure and his blood pressure was found to be 223/115.  The patient states he has been compliant with all his medicines, however this is a recurrent pattern and he will be admitted, placed in Intensive Care and given dialysis to decrease his volume overload status and as well as his blood pressure and replaced on all of his usual antihypertensive medicines.  His initial troponin and CK-MB are essentially negative.  He does have orthopnea.  He does have cough.  He does have dyspnea - hemoptysis.  PAST MEDICAL HISTORY:  Significant for the aforementioned pneumonia, accelerated hypertension, end-stage renal disease on dialysis, two- vessel coronary disease, ejection fraction of 35-40%, COPD, bipolar disorder, schizoaffective disorder, restless legs syndrome, depression, anemia to secondary chronic renal failure, hyperlipidemia, and chronic noncompliance.  PAST SURGICAL HISTORY:  Remarkable for coronary artery  stenting x2 as well as appendectomy.  He also had a herniated nucleus pulposus in the lumbosacral distribution which had laminectomy.  ALLERGIES:  He has no known allergies.  CURRENT MEDICATIONS: 1. Labetalol 800 mg p.o. b.i.d. 2. Aspirin 81 mg p.o. daily. 3. Prinivil 20 mg p.o. daily. 4. Hydralazine 50 mg p.o. t.i.d. 5. Crestor 20 mg p.o. daily. 6. Cymbalta 60 mg p.o. daily. 7. Coreg 12.5 mg p.o. b.i.d. 8. Norvasc 10 mg p.o. daily. 9. Zyprexa 10 mg p.o. b.i.d. 10.Xanax 0.5 mg p.o. t.i.d. 11.Clonidine 0.1 mg p.o. t.i.d. 12.Plavix 75 mg p.o. daily.  PHYSICAL EXAMINATION:  VITAL SIGNS:  Blood pressure is 215/112 at present, pulse is 90 and regular, respiratory rate is 24.  He is afebrile. EYES:  PERRLA.  Extraocular movements intact.  Sclerae clear. Conjunctivae pink. NECK:  She has positive JVD with 45 degrees of angulation.  No carotid bruits.  No thyromegaly or carotid bruits. LUNGS:  Show bibasilar rales, scattered rhonchi in both lung fields, prolonged respiratory phase, use of accessory muscles of respiration noted. HEART:  Regular rhythm.  No murmurs.  No S3 gallop, S4 gallop auscultated.  No heaves, thrills, or rubs. ABDOMEN:  Soft, nontender.  Bowel sounds normoactive.  No guarding, rebound, or hepatosplenomegaly EXTREMITIES:  Trace to 1+ pedal edema. NEUROLOGIC:  The patient is alert and oriented, somewhat anxious in moderate respiratory distress.  Cranial nerves  grossly intact.  The patient moves all 4 extremities.  Plantars are downgoing.  IMPRESSION:  As follows: 1. Vascular overload. 2. Congestive heart failure. 3. Secondary accelerated hypertension. 4. Chronic noncompliance. 5. End-stage renal disease, on dialysis. 6. Coronary artery disease, two-vessel with diminished for ejection     fraction 35-40% which is chronic. 7. Hyperlipidemia. 8. Chronic obstructive pulmonary disease. 9. Bipolar disorder. 10.Schizoaffective  disorder. 11.Anxiety. 12.Depression. 13.Hyperlipidemia.  The plan at present is to reinstitute all antihypertensives, urgent dialysis to decrease intravascular volume status, serial cardiac enzymes and we will make further recommendations as the database expands.     Melvyn Novas, MD     RMD/MEDQ  D:  06/02/2011  T:  06/03/2011  Job:  [id number]  Electronically Signed by Oval Linsey MD on 07/26/2011 03:03:01 PM

## 2011-07-26 NOTE — Discharge Summary (Signed)
  [name], TOROK NO.:  [id number]  MEDICAL RECORD NO.:  [id number]  LOCATION:  IC09                          FACILITY:  [facility]  PHYSICIAN:  Melvyn Novas, MDDATE OF BIRTH:  15-Mar-1976  DATE OF ADMISSION:  06/09/2011 DATE OF DISCHARGE:  06/23/2012LH                              DISCHARGE SUMMARY   ADDENDUM:  The patient was also discharged on Norvasc 10 mg p.o. daily in addition to all the aforementioned medicines for discharge.     Melvyn Novas, MD     RMD/MEDQ  D:  06/20/2011  T:  06/21/2011  Job:  [id number]  Electronically Signed by Oval Linsey MD on 07/26/2011 03:03:16 PM

## 2011-07-26 NOTE — Progress Notes (Signed)
bjective: He is a patient was history of end-stage renal disease on hemodialysis presently came wheeze and CHF and the uncontrolled hypertension. Presently patient seems to be feeling better.ies any or her shortness of breath orthopnea  or paroxysmal nocturnal dyspnea and. He denies also any chest pain           Physical Exam: The vital signs WUJ:WJXB:  [97.5 F (36.4 C)-98.6 F (37 C)] 98 F (36.7 C) (08/07 0400) Pulse Rate:  [41-109] 66  (08/07 0300) Resp:  [12-28] 12  (08/07 0500) BP: (131-207)/(62-134) 146/78 mmHg (08/07 0500) SpO2:  [92 %-100 %] 94 % (08/07 0300) Weight:  [79.1 kg (174 lb 6.1 oz)] 174 lb 6.1 oz (79.1 kg) (08/07 0500)  patient is alert in no apparent distress Chest clear to auscultation was having rest no rhonchi or egophony doesn't have any wheezing Heart exam revealed regular rate and rhythm no murmur no S3 Abdomen soft positive bowel sound Extremities she doesn't have any edema.   Investigations: Results for orders placed during the hospital encounter of 07/24/11 (from the past 24 hour(s))  PHOSPHORUS     Status: Abnormal   Collection Time   07/26/11  5:01 AM      Component Value Range   Phosphorus 4.9 (*) 2.3 - 4.6 (mg/dL)  BASIC METABOLIC PANEL     Status: Abnormal   Collection Time   07/26/11  5:01 AM      Component Value Range   Sodium 140  135 - 145 (mEq/L)   Potassium 3.8  3.5 - 5.1 (mEq/L)   Chloride 99  96 - 112 (mEq/L)   CO2 24  19 - 32 (mEq/L)   Glucose, Bld 100 (*) 70 - 99 (mg/dL)   BUN 44 (*) 6 - 23 (mg/dL)   Creatinine, Ser 1.47 (*) 0.50 - 1.35 (mg/dL)   Calcium 82.9 (*) 8.4 - 10.5 (mg/dL)   GFR calc non Af Amer 7 (*) >60 (mL/min)   GFR calc Af Amer 9 (*) >60 (mL/min)  CBC     Status: Abnormal   Collection Time   07/26/11  5:01 AM      Component Value Range   WBC 5.6  4.0 - 10.5 (K/uL)   RBC 3.02 (*) 4.22 - 5.81 (MIL/uL)   Hemoglobin 8.9 (*) 13.0 - 17.0 (g/dL)   HCT 56.2 (*) 13.0 - 52.0 (%)   MCV 89.7  78.0 - 100.0 (fL)   MCH  29.5  26.0 - 34.0 (pg)   MCHC 32.8  30.0 - 36.0 (g/dL)   RDW 86.5 (*) 78.4 - 15.5 (%)   Platelets 211  150 - 400 (K/uL)   Recent Results (from the past 240 hour(s))  MRSA PCR SCREENING     Status: Normal   Collection Time   07/17/11  7:20 PM      Component Value Range Status Comment   MRSA by PCR NEGATIVE  NEGATIVE  Final   MRSA PCR SCREENING     Status: Abnormal   Collection Time   07/24/11  3:17 PM      Component Value Range Status Comment   MRSA by PCR INVALID RESULTS, SPECIMEN SENT FOR CULTURE (*) NEGATIVE  Final        Medications: I have reviewed the patient's current medications.  Impression Problem #1 CHF is up from uncontrolled salting and fluid intake patient had dialysis the day before yesterday  and we are able to get about 4 L. Presently is feeling better Problem #2 end-stage  renal disease is status post hemodialysis the day before yesterday pain is 44 creatinine is 8.5 one his potassium of 3.8 which seems to be stable her Problem #3 history of anemia easy and on Epogen H&H seems to be declining to 8.0 and 27.1. Problem #4 uncontrolled hypertension his blood pressure seems to be reasonable today but still fluctuating. Problem #5 bipolar disorder Problem #6 coronary artery disease is a symptomatic Problem #7 history of hyperphosphatemia his rib binder Problem #8 history of COPD.     Plan:  recommendation I will dialyze patient today I will try to get about 4 L. We'll encourage the patient to cut down his salt and fluid intake and be compliant with his dialysis. We'll increase his Epogen Once the patient is dialyze if he is discharged home we'll followup in outpatient.      LOS: 2 days   Leigh Kaeding S 07/26/2011, 6:46 AM

## 2011-07-26 NOTE — Discharge Summary (Signed)
  NAME:  [name], [name] NO.:  [id number]  MEDICAL RECORD NO.:  [id number]  LOCATION:                                 FACILITY:  PHYSICIAN:  Melvyn Novas, MDDATE OF BIRTH:  08/28/1976  DATE OF ADMISSION: DATE OF DISCHARGE:  LH                              DISCHARGE SUMMARY   HISTORY OF PRESENT ILLNESS/HOSPITAL COURSE:  The patient is a 35 year old white male with recurrent repeated admissions to the hospital.  At this time, family states he took approximately 70.5 Xanax, arrives in the ER at 5:00 a.m., obtunded, responsive only to tactile and painful stimuli.  He was put in the ICU to monitor his renal, respiratory status as well as his hemodynamics.  He had subsequent dialysis on day of admission.  Alcohol salicylate levels were negative.  Interestingly, there were no benzodiazepines in his system on urine drug screen, this does not fit to clinical picture.  The patient has had recurrent admissions for history of hypertension, congestive heart failure due to noncompliance and excessive 20-30 __________ caring from the last year. Basically, he is going to be held and seen by the __________ for an evaluation of involuntary commitment due to him being endanger to himself.  The patient verbalizes no suicidal ideation.  At this time, he is more hemodynamically stable, although his blood pressure this morning is 180/100.  He is being placed back on his antihypertensive regimen and seems to have down over the next day or so when oral medicines are reinstituted.  He denies any angina or anginal equivalents, dizziness, or syncope.  Laboratory data of significance, his potassium of 5.1, phosphorus within normal limits.  BUN and creatinine of 28 and 6.08 respectively.  CBC reveals a chronic anemia with hemoglobin of 9.3 where he receives erythropoietin with dialysis.  Blood gas yesterday revealed a pH of 7.33, pCO2 of 45.9, pO2 76.3 with a 95% sat.  His  respiratory status clinically has much improved.  He is much more alert today. __________ was done revealing 0.25 essentially noncompliant and alcohol level was negative as well as salicylates.  The patient will be discharged if he is accepted to involuntary commitment or he will go home as his request if he is cleared by them.  DISCHARGE MEDICINES: 1. Norvasc 10 mg p.o. daily. 2. Aspirin 81 mg p.o. daily. 3. Clonidine 0.1 mg p.o. b.i.d. 4. Plavix 75 mg p.o. daily. 5. Cymbalta 60 mg p.o. daily. 6. Hydralazine 50 mg p.o. t.i.d. 7. Hydroxyzine 25 mg p.o. t.i.d. p.r.n. for anxiety #21. 8. Labetalol 800 mg p.o. b.i.d. 9. Nephro-Vite one p.o. t.i.d. 10.Rena-Vite one p.o. daily. 11.Ropinirole 1 mg p.o. nightly. 12.Crestor 10 mg p.o. daily.     Melvyn Novas, MD     RMD/MEDQ  D:  06/10/2011  T:  06/10/2011  Job:  [id number]  Electronically Signed by Oval Linsey MD on 07/26/2011 03:03:13 PM

## 2011-07-26 NOTE — Discharge Summary (Signed)
  NAME:  [name], COUPE NO.:  [id number]  MEDICAL RECORD NO.:  [id number]  LOCATION:  IC09                          FACILITY:  [facility]  PHYSICIAN:  Melvyn Novas, MDDATE OF BIRTH:  1976-12-06  DATE OF ADMISSION:  06/09/2011 DATE OF DISCHARGE:  06/23/2012LH                              DISCHARGE SUMMARY   The patient is well known to myself with recurrent admissions with accelerated hypertension, ischemic cardiomyopathy, noncompliance, chronic renal failure, volume overload, COPD, accelerated hypertension, hyperlipidemia, and end-stage renal disease.  The patient again became noncompliant with his medicines, reported to ER with a systolic greater than 200 and dyspnea, and was subsequently admitted.  Myocardial infarction was ruled out on the basis of enzymes.  His creatinine was in the 8 range upon admission.  Subsequently, had 3 episodes of hemodialysis.  Clonidine 0.1 mg p.o. b.i.d. was added to his medical regimen of antihypertensives.  His systolics slowly drifted down over 2- 3 days which is anticipated for him to a systolic of 140 range.  He denied angina or anginal equivalence.  His dyspnea resolved.  The patient was alert, oriented, and coherent.  He had been referred to a [facility] 1 week ago for similar admission, they felt he was not suicidal, he had Xanax overdose, and he could report as an outpatient.  He was not voluntarily committed.  The patient failed to follow up.  At least at this point, he is urged to follow up with Outpatient Psychiatry at [facility].  His creatinine was in the 5 range.  Upon discharge, electrolytes and calcium and phosphorus were within normal range.  His hemoglobin was 8.9, and he received erythropoietin with his dialysis. He was subsequently discharged on the following medicines: 1. Calcium carbonate suspension 500 mg p.o. q.6 hours p.r.n. 2. Docusate 283 mg  enema daily p.r.n. 3. Labetalol 800 mg p.o. b.i.d. 4. Lisinopril 20 mg p.o. b.i.d. 5. Amlodipine 10 mg p.o. daily. 6. Aspirin 81 mg p.o. daily. 7. Sensipar 30 mg 2 tablets by mouth daily. 8. Clonidine 0.1 mg p.o. b.i.d. 9. Plavix 75 mg p.o. daily. 10.Cymbalta 60 mg p.o. daily. 11.Gabapentin 300 mg p.o. t.i.d. 12.Hydralazine 50 mg p.o. t.i.d. 13.Hydroxyzine 25 mg p.o. q.8 hours p.r.n. for anxiety. 14.Zyprexa 10 mg p.o. b.i.d. 15.Ropinirole 2 mg p.o. at bedtime. 16.Rena-Vite, Nephro-Vite tab 1 p.o. daily. 17.Crestor 10 mg p.o. daily. 18.Renagel 800 mg 2 tablets p.o. a.c. t.i.d.  The patient will follow up in the office in 1 week.  He was not given any opioid analgesia and no Xanax due to his recent overdose with above issues.  He was given hydroxyzine for anxiety.     Melvyn Novas, MD     RMD/MEDQ  D:  06/21/2011  T:  06/22/2011  Job:  [id number]  Electronically Signed by Oval Linsey MD on 07/26/2011 03:03:19 PM

## 2011-07-26 NOTE — Discharge Summary (Signed)
  NAME:  [name], [name] NO.:  [id number]  MEDICAL RECORD NO.:  [id number]  LOCATION:  IC09                          FACILITY:  [facility]  PHYSICIAN:  Melvyn Novas, MDDATE OF BIRTH:  November 09, 1976  DATE OF ADMISSION:  06/02/2011 DATE OF DISCHARGE:  LH                              DISCHARGE SUMMARY   The patient is a 35 year old white male with chronic noncompliance, history of ischemic cardiomyopathy, two-vessel disease, ejection fraction 35%-40%, accelerated hypertension, end-stage renal disease, hyperlipidemia, hypertension, chronic congestive heart failure, chronic noncompliance, COPD, history of pneumonia.  He also has personality disorder, bipolar disease, and schizoaffective disorder.  The patient again stopped his medicines, signed out of dialysis, was sent in the ER complaining of atypical chest pain, seen and evaluated.  Cardiac enzymes were negative serially x3, troponin and regular cardiac enzymes.  His blood pressure on admission was in the 220/110 range.  He was given urgent dialysis, was placed in ICU, myocardial infarction was ruled out, placed again on all his antihypertensive medicines and on the third hospital day, his blood pressure systolic was 150/77.  The patient denied angina, orthopnea, or PND.  He typically signs out AMA.  His discharge summary is done at this time.  The patient is much more hemodynamically stable.  No evidence of active ischemia.  The patient will have dialysis today and then will be discharged subsequent to dialysis if hemodynamically stable.  His discharge medicines include: 1. Xanax 0.5 p.o. t.i.d. 2. Carvedilol 12.5 p.o. b.i.d. 3. Sensipar 30 mg p.o. daily. 4. Clonidine 0.2 mg p.o. b.i.d. 5. Epogen 10,000 units IV Monday, Wednesday, Friday. 6. Labetalol 800 p.o. b.i.d. 7. Lisinopril 10 mg p.o. daily. 8. Crestor 5 mg p.o. daily. 9. Renagel 800 mg 2 tablets before meals t.i.d. 10.Amlodipine 10 mg p.o.  daily. 11.Plavix 75 mg p.o. daily. 12.Cymbalta 60 mg p.o. daily. 13.Neurontin 300 mg p.o. t.i.d. 14.Hydralazine 50 mg p.o. t.i.d. 15.Zyprexa 10 mg p.o. b.i.d. 16.Renal vitamin 1 p.o. daily. 17.Ropinirole 2 mg p.o. at bedtime.  He also has Percocet at home for     occasions of chest pain and I believe, he is     on Xanax 0.5 p.o. b.i.d., this is not being written at discharge,     this is taken care of on a regularly scheduled basis in the office     for his anxiety disorder.  The patient will follow up with me in 3 days' time.     Melvyn Novas, MD     RMD/MEDQ  D:  06/04/2011  T:  06/04/2011  Job:  [id number]  Electronically Signed by Oval Linsey MD on 07/26/2011 03:03:08 PM

## 2011-07-26 NOTE — Discharge Summary (Signed)
  [id number] 

## 2011-07-26 NOTE — Progress Notes (Signed)
UR Chart Review Completed  

## 2011-07-26 NOTE — Progress Notes (Signed)
NAME:  [name], [name] NO.:  [id number]  MEDICAL RECORD NO.:  [id number]  LOCATION:  IC05                          FACILITY:  [facility]  PHYSICIAN:  Melvyn Novas, MDDATE OF BIRTH:  [birth year]  DATE OF PROCEDURE:  07/25/2011 DATE OF DISCHARGE:                                PROGRESS NOTE   The patient came in with accelerated  hypertension, congestive heart failure on the basis of this and systolic dysfunction on the basis of two-vessel coronary artery disease status post stenting and chronic noncompliance.  Today his blood pressure after dialysis yesterday is still 180/102.  He complains of less dyspnea.  He denies anginal pain, orthopnea, PND.  He has had 4 L taken off according to the nurse's notes.  Currently his temperature is 97.9, pulse is 69 and regular with frequent APCs, respiratory rate is 20, O2 sat is 100%.  Cardiac enzymes are negative.  Hemoglobin is 10.8, white count is 9.4.  Chest x-ray reveals just interstitial edema.  No specific infiltrate noted.  He has no evidence of JVD, no carotid bruits, no thyromegaly, 45 degrees angulation.  Lungs have diminished breath sounds at bases.  Scattered coarse rhonchi.  No rales appreciable.  Diminished breath sounds at the bases.  Heart has regular rate and rhythm.  No S3, S4, gallop.  No heaves, thrills, or rubs.  Plan at present is to reinstitute clonidine 0.2 mg p.o. b.i.d., increase labetalol to 400 mg p.o. b.i.d., resume Plavix 75 mg p.o. daily, and resume hydralazine 50 mg p.o. t.i.d.  The patient will have dialysis I believe in a.m. and we will check his BMET at that time.  Potassium is 4.5.     Melvyn Novas, MD     RMD/MEDQ  D:  07/25/2011  T:  07/26/2011  Job:  [id number]

## 2011-07-26 NOTE — Progress Notes (Signed)
Outpatient plan of care on chart reviewed  Pt. Ed. Controlling fluids provided

## 2011-07-26 NOTE — Discharge Summary (Signed)
  NAME:  [name], [name] NO.:  [id number]  MEDICAL RECORD NO.:  [id number]           PATIENT TYPE:  LOCATION:                                 FACILITY:  PHYSICIAN:  Melvyn Novas, MDDATE OF BIRTH:  May 30, 1976  DATE OF ADMISSION: DATE OF DISCHARGE:  LH                              DISCHARGE SUMMARY   The patient is a 35 year old white male with chronic noncompliance with a history of ischemic cardiomyopathy, two-vessel disease, ejection fraction 35%- 40%, accelerated hypertension, end-stage renal disease, on dialysis, hyperlipidemia, hypertension, chronic congestive heart failure, and chronic noncompliance with medicines, diet, and time and duration of dialysis.  The patient comes in with a 2-3 day history of increasing dyspnea, was found to have volume overload per chest x-ray and clinically with accelerated hypertension, and the blood pressure was 210-110 range with a sinus tachycardia rate of 112-115 beats per minute. His initial cardiac enzymes were negative.  The patient was given urgent hemodialysis in the ER and taking of about 4 liters.  He seemed to be somewhat improved with resumption of his antihypertensives.  His blood pressure was drifting down to the systolic of 163/92 range, 24 hours after admission.  We will continue to observe this.  The patient denies anginal chest pain.  His major electrolyte abnormality in the hospital was potassium at 3.2.  He was given 20 mEq IV and we will have resumption of dialysis in the morning.  He was placed back on all his antihypertensive medicines and continued to do well.  In anticipation of discharge, his chronic medicines are as follows: 1. Xanax 0.5 mg p.o. t.i.d. 2. Carvedilol 12.5 mg p.o. b.i.d. 3. Sensipar 30 mg p.o. daily. 4. Clonidine 0.1 mg p.o. t.i.d. 5. Epogen 10,000 units IV Monday, Wednesday, and Friday. 6. Labetalol 400 mg p.o. b.i.d. 7. Lisinopril 20 mg p.o. daily. 8. Crestor 5 mg  p.o. daily. 9. Renagel 800 mg 2 tablets a.c. t.i.d. 10.Azithromycin for questionable right lower lobe consolidation 250 mg     p.o. daily for four additional days after discharge. 11.Amlodipine 10 mg p.o. daily. 12.Plavix 75 mg p.o. daily. 13.Cymbalta 60 mg p.o. daily. 14.Dexilant 60 mg p.o. daily. 15.Neurontin 300 mg p.o. t.i.d. 16.Hydralazine 50 mg p.o. t.i.d. 17.Zyprexa 10 mg p.o. b.i.d. 18.Renal vitamin 1 tablet p.o. daily. 19.Ropinirole 2 mg p.o. at bedtime for restless legs syndrome.  The patient will follow up in the office in three days' time to assess hemodynamics, blood pressure, and electrolytes.  He is urged to go to dialysis as required.  The patient understands the risk of CHF, sudden death, stroke, MI, dysrhythmia.  He is also counseled to stop smoking.     Melvyn Novas, MD     RMD/MEDQ  D:  04/28/2011  T:  04/29/2011  Job:  [id number]  Electronically Signed by Oval Linsey MD on 07/26/2011 03:02:48 PM

## 2011-07-27 NOTE — Discharge Summary (Signed)
NAME:  [name], [name] NO.:  [id number]  MEDICAL RECORD NO.:  [id number]  LOCATION:  IC05                          FACILITY:  [facility]  PHYSICIAN:  Melvyn Novas, MDDATE OF BIRTH:  23-Jan-1976  DATE OF ADMISSION:  07/24/2011 DATE OF DISCHARGE:  08/07/2012LH                              DISCHARGE SUMMARY   The patient is a 35 year old white male with recurrent hospital admissions.  He was admitted this time with recurring congestive heart failure, pulmonary edema due to accelerated hypertension, due to chronic renal disease, hypertension, ischemic cardiomyopathy, 2-vessel disease, diminished systolic function with ejection fraction of 45%, COPD, schizophrenia, bipolar disorder, chronic noncompliance.  The patient was admitted with accelerated hypertension, blood pressure in the 220/110 range.  Chest x-ray revealed consistent with pulmonary edema.  He was admitted, given an urgent dialysis, and resumed his antihypertensive and congestive heart failure regimen.  Myocardial infarction was ruled out. He was hemodynamically improved over 2-3 days.  He received dialysis twice on 2 successive days.  On the day of discharge, he was hemodynamically stable, blood pressure with the addition of several medicines revealed a blood pressure of 135/74, pulse of 68 and regular, O3 sat was 98% on room air.  He was alert and oriented.  Significant laboratory abnormalities revealed chronic creatinine in the 6-8 range. Potassium was within normal limits.  EKG and cardiac enzymes were within normal limits.  The patient was again urged for smoking cessation, counseled on these, importance of continuing all his outpatient medicines, and he seemed to acknowledge and understand that.  His discharge medicines included: 1. Proventil HFA 2 puffs q.i.d. 2. Amlodipine 10 mg p.o. daily. 3. Aspirin 81 mg p.o. daily. 4. Nephro-Vite 1 tablet p.o. daily. 5. Plavix 75 mg p.o. daily. 6.  Dexilant 60 mg p.o. daily. 7. Cymbalta 60 mg p.o. daily. 8. Hydralazine 50 mg p.o. q.8 hours. 9. Hydroxyzine 25 mg p.o. b.i.d. p.r.n. anxiety. 10.Labetalol 400 mg p.o. b.i.d. 11.Lisinopril 20 mg p.o. b.i.d. 12.Robaxin 500 mg p.o. t.i.d. p.r.n. 13.Zyprexa 10 mg p.o. b.i.d. 14.Lyrica 75 mg p.o. b.i.d. p.r.n. 15.Ropinirole 1 mg p.o. at bedtime. 16.Crestor 20 mg p.o. daily. 17.Renvela 800 mg p.o. daily. 18.Ambien 10 mg p.o. daily. 19.Clonidine 0.2 mg p.o. b.i.d.  The patient was urged to follow his renal diet.  Followup in my office within 3 days' time.  His hemoglobin was 8.9 on the day of discharge, it is a little drop, this could be dilutional.  We will check his hemoglobin within 1 week.     Melvyn Novas, MD     RMD/MEDQ  D:  07/26/2011  T:  07/27/2011  Job:  [id number]

## 2011-07-28 LAB — MRSA CULTURE

## 2011-07-29 NOTE — Discharge Summary (Signed)
NAME:  [name], [name] NO.:  [id number]  MEDICAL RECORD NO.:  [id number]  LOCATION:                                 FACILITY:  PHYSICIAN:  Melvyn Novas, MDDATE OF BIRTH:  [birth year]  DATE OF ADMISSION:  07/24/2011 DATE OF DISCHARGE:  08/07/2012LH                              DISCHARGE SUMMARY   The patient is a 35 year old white male who has chronic recurrent accelerated hypertension, end-stage renal disease, ischemic cardiomyopathy, hyperlipidemia, two-vessel coronary artery disease, impaired LV function, EF 40-45% with schizophrenia, bipolarity, and chronic noncompliance.  The patient essentially was hospitalized with accelerated hypertension and noncompliance grossly elevated systolic pressures.  He was admitted, resumed his antihypertensive regimen, myocardial infarction was ruled out.  He had urgent dialysis to take off several liters of fluid and then he had secondary dialysis day 2 of admission.  He was hemodynamically stable.  His blood pressure returned to 139/84 range.  He had no angina, anginal equivalents.  No other significant laboratory abnormalities except creatinine in the 6-8 range which is chronic for him.  Other electrolytes were within normal range. The patient was again stressed for smoking cessation, and the need for continued compliance with medicines, otherwise risk of heart failure, death, stroke, and MI are appreciable.  The patient seemed to understand these concerns.  He was subsequently discharged on the following medicines:  Aspirin 81 mg p.o. daily, labetalol 400 mg p.o. b.i.d., Prinivil 20 mg p.o. daily, hydralazine 50 mg p.o. t.i.d., Crestor 20 mg p.o. daily, Cymbalta 60 mg p.o. daily, Coreg 12.5 mg p.o. b.i.d., Norvasc 10 mg p.o. daily, Zyprexa 10 mg p.o. b.i.d., Xanax 0.25 mg p.o. t.i.d., clonidine 0.1 mg p.o. b.i.d. and Plavix 75 mg p.o. daily.  The patient is urged to follow up in office in 2-3 days time for  assessment of electrolytes, renal function, and assessment of lung and hemodynamic status.     Melvyn Novas, MD     RMD/MEDQ  D:  07/28/2011  T:  07/28/2011  Job:  [id number]

## 2011-07-29 NOTE — Discharge Summary (Signed)
NAME:  [name], [name] NO.:  [id number]  MEDICAL RECORD NO.:  [id number]  LOCATION:                                 FACILITY:  PHYSICIAN:  Melvyn Novas, MDDATE OF BIRTH:  02-26-1976  DATE OF ADMISSION: DATE OF DISCHARGE:  LH                              DISCHARGE SUMMARY   HISTORY OF PRESENT ILLNESS:  The patient has a history of chronic congestive heart failure, systolic dysfunction due to ischemic cardiomyopathy, end-stage renal disease on dialysis, hypertension, bipolar disease, schizophrenia, anemia of chronic renal disease, hyperlipidemia, coronary artery disease, status post stenting.  The patient was admitted by Dr. Felecia Shelling and found to have congestive heart failure, pulmonary edema per chest x-rays and increasing dyspnea and subsequently admitted for urgent dialysis and resumption of her antihypertensive medicines.  The patient recurrently stops all antihypertensive medicines and presents to the ER on almost weekly basis at this point despite rigorous counseling.  The patient had myocardial infarction ruled out and was admitted to ICU and had urgent dialysis with volume reduction of 4 liters and then subsequent hemodialysis on the second day of admission.  The patient was hemodynamically stable while in hospital.  Electrolytes were within normal limits, and she had no evidence of myocardial infarction.  The patient was again counseled to take all of her home medicines and follow up with me as an outpatient and also with dialysis which she seems to cut short.  DISCHARGE MEDICINES: 1. Hydralazine 50 mg p.o. t.i.d. 2. Labetalol 400 mg p.o. b.i.d. 3. Nephro-Vite 1 tablet p.o. daily. 4. Norvasc 10 mg p.o. daily. 5. Dexilant 60 mg p.o. daily. 6. Plavix 75 mg p.o. daily. 7. Aspirin 325 mg p.o. daily. 8. Crestor 20 mg p.o. daily. 9. Zyprexa 10 mg p.o. b.i.d. 10.Requip 2 mg one p.o. at bedtime. 11.Cymbalta 60 mg p.o. daily. 12.Lisinopril 20 mg  p.o. b.i.d.     Melvyn Novas, MD    RMD/MEDQ  D:  07/28/2011  T:  07/28/2011  Job:  [id number]

## 2011-07-31 IMAGING — CR DG CHEST 1V PORT
1 series · 1 of 1 positions shown · non-contrast
Comparison: Portable chest x-ray 09/23/2009 and 01/05/2009.  Two-
view chest x-ray 10/13/2008.

CLINICAL DATA: Chest pain.  Shortness of breath.  Nausea.  Left
upper extremity pain.  Smoker.

PORTABLE CHEST - 1 VIEW 01/15/2010:

[view not recorded]
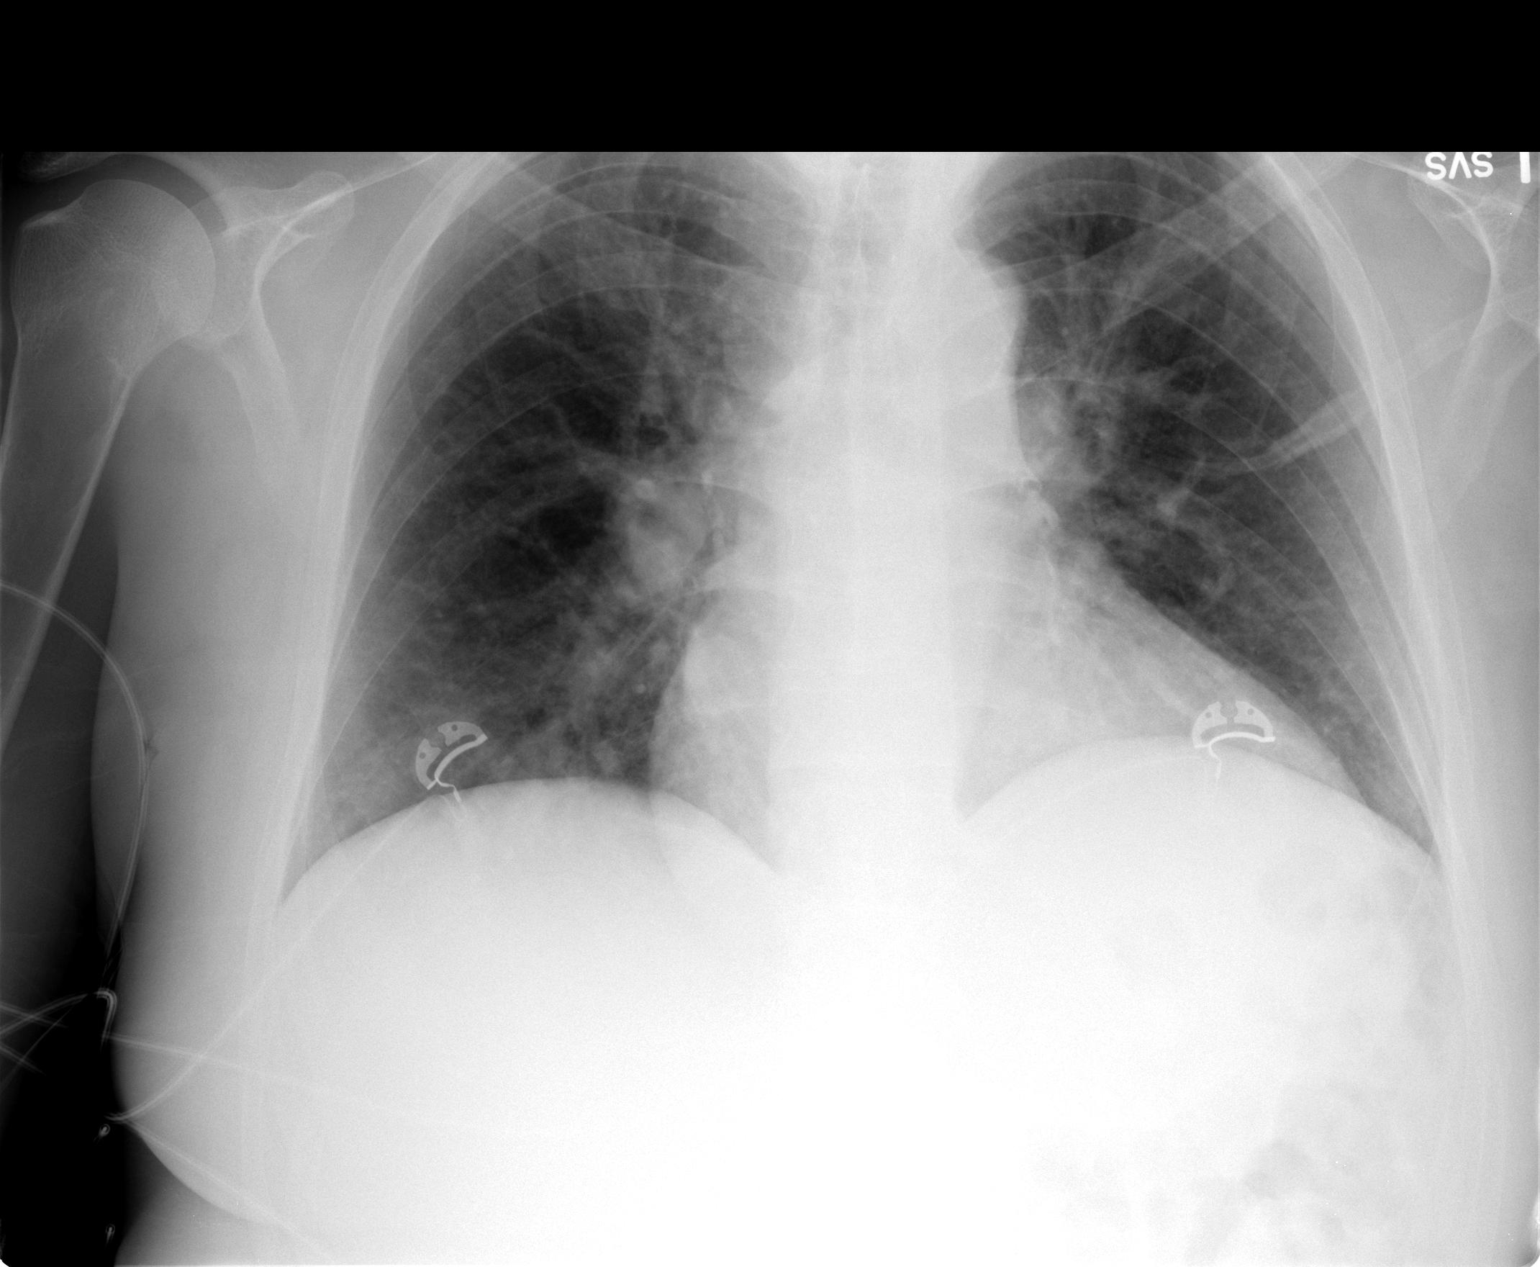

[1 of 1 positions shown; findings below may reference images not displayed]

FINDINGS: Heart moderately enlarged but stable, allowing for
differences in technique.  Mild pulmonary venous hypertension
without overt edema.  Suboptimal inspiration accounting for crowded
bronchovascular markings at the bases; taking this into account,
lungs clear.  No visible pleural effusions.
IMPRESSION: Stable cardiomegaly.  Suboptimal inspiration.  No acute
cardiopulmonary disease.

## 2011-08-01 ENCOUNTER — Other Ambulatory Visit: Payer: Self-pay

## 2011-08-01 ENCOUNTER — Emergency Department (HOSPITAL_COMMUNITY): Payer: Medicare Other

## 2011-08-01 ENCOUNTER — Emergency Department (HOSPITAL_COMMUNITY)
Admission: EM | Admit: 2011-08-01 | Discharge: 2011-08-01 | Disposition: A | Discharge status: Home / Self Care | Payer: Medicare Other | Attending: Emergency Medicine | Admitting: Emergency Medicine

## 2011-08-01 ENCOUNTER — Encounter (HOSPITAL_COMMUNITY): Payer: Self-pay | Admitting: *Deleted

## 2011-08-01 DIAGNOSIS — R0602 Shortness of breath: Secondary | ICD-10-CM | POA: Insufficient documentation

## 2011-08-01 DIAGNOSIS — G894 Chronic pain syndrome: Secondary | ICD-10-CM | POA: Insufficient documentation

## 2011-08-01 DIAGNOSIS — Z7982 Long term (current) use of aspirin: Secondary | ICD-10-CM | POA: Insufficient documentation

## 2011-08-01 DIAGNOSIS — I10 Essential (primary) hypertension: Secondary | ICD-10-CM | POA: Insufficient documentation

## 2011-08-01 DIAGNOSIS — Z79899 Other long term (current) drug therapy: Secondary | ICD-10-CM | POA: Insufficient documentation

## 2011-08-01 DIAGNOSIS — F209 Schizophrenia, unspecified: Secondary | ICD-10-CM | POA: Insufficient documentation

## 2011-08-01 DIAGNOSIS — IMO0001 Reserved for inherently not codable concepts without codable children: Secondary | ICD-10-CM

## 2011-08-01 DIAGNOSIS — I519 Heart disease, unspecified: Secondary | ICD-10-CM | POA: Insufficient documentation

## 2011-08-01 DIAGNOSIS — F17200 Nicotine dependence, unspecified, uncomplicated: Secondary | ICD-10-CM | POA: Insufficient documentation

## 2011-08-01 DIAGNOSIS — F319 Bipolar disorder, unspecified: Secondary | ICD-10-CM | POA: Insufficient documentation

## 2011-08-01 DIAGNOSIS — I509 Heart failure, unspecified: Secondary | ICD-10-CM | POA: Insufficient documentation

## 2011-08-01 LAB — CBC
HCT: 27.7 % — ABNORMAL LOW (ref 39.0–52.0)
MCV: 89.9 fL (ref 78.0–100.0)
RDW: 17.8 % — ABNORMAL HIGH (ref 11.5–15.5)
WBC: 7.9 10*3/uL (ref 4.0–10.5)

## 2011-08-01 LAB — DIFFERENTIAL
Eosinophils Relative: 2 % (ref 0–5)
Lymphocytes Relative: 18 % (ref 12–46)
Lymphs Abs: 1.4 10*3/uL (ref 0.7–4.0)
Monocytes Absolute: 0.3 10*3/uL (ref 0.1–1.0)

## 2011-08-01 LAB — COMPREHENSIVE METABOLIC PANEL
CO2: 20 mEq/L (ref 19–32)
Calcium: 11.1 mg/dL — ABNORMAL HIGH (ref 8.4–10.5)
Creatinine, Ser: 7.44 mg/dL — ABNORMAL HIGH (ref 0.50–1.35)
GFR calc Af Amer: 10 mL/min — ABNORMAL LOW (ref >60)
GFR calc non Af Amer: 8 mL/min — ABNORMAL LOW (ref >60)
Glucose, Bld: 91 mg/dL (ref 70–99)
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
## 2011-08-01 MED ORDER — ONDANSETRON HCL 4 MG PO TABS
4.0000 mg | ORAL_TABLET | Freq: Once | ORAL | Status: AC
  Administered 2011-08-01: 4 mg via ORAL
  Filled 2011-08-01: qty 1

## 2011-08-01 MED ORDER — OXYCODONE-ACETAMINOPHEN 5-325 MG PO TABS
1.0000 | ORAL_TABLET | Freq: Once | ORAL | Status: AC
  Administered 2011-08-01: 1 via ORAL
  Filled 2011-08-01: qty 1

## 2011-08-01 NOTE — Consult Note (Signed)
  [id number] 

## 2011-08-01 NOTE — ED Notes (Signed)
Patient states that he is breathing better and does not need anything at this time.

## 2011-08-01 NOTE — Progress Notes (Signed)
Case discussed with Dr Janna Arch. He will come to ED to see pt. Pt continued to c/o back pain. Xray obtained, and pain med given.

## 2011-08-01 NOTE — ED Notes (Signed)
Pt c/o shortness of breath since yesterday afternoon. Pt states that he fell in the shower yesterday and injured his lower back. Pt states that the shortness of breath started after his injury. States that he went to dialysis on Saturday and finished his treatment.

## 2011-08-01 NOTE — ED Notes (Signed)
Pt undischarged for Dr. Janna Arch to be able to dictate his note.  Will discharge him once the MD is through with the chart.

## 2011-08-01 NOTE — Progress Notes (Signed)
Nursing staff report delay in labs and treatment due to poor IV access. O2 sat improved after oxygen applied.
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
## 2011-08-02 NOTE — Progress Notes (Signed)
NAME:  [name], [name] NO.:  [id number]  MEDICAL RECORD NO.:  [id number]  LOCATION:                                 FACILITY:  PHYSICIAN:  Melvyn Novas, MDDATE OF BIRTH:  [birth year]  DATE OF PROCEDURE:  08/01/2011 DATE OF DISCHARGE:  08/01/2011                                PROGRESS NOTE   The patient is well-known to me, recently discharged 3 days ago for accelerated hypertension, congestive heart failure which is acute on chronic.  He has two-vessel coronary disease, end-stage renal disease, COPD, chronic noncompliance, bipolarity, schizophrenia, hyperlipidemia. The patient states he fell on the shower.  He denies any antecedent dizziness or palpitations or angina or anginal equivalents, seen in the ER and x-rays of his lumbosacral spine revealed no fracture.  Here, his chest x-ray was done which revealed improved CHF from recent discharge of several days.  This could be delayed radiographic findings while certainly improved over his hospitalization.  PHYSICAL EXAMINATION:  VITAL SIGNS:  His blood pressure in the ER on the monitor is 136/82, O2 sat is 98%. LUNGS:  Show minimal bibasilar dullness to percussion, scattered rhonchi which are chronic, and no rales auscultated. HEART:  Regular rhythm.  No S3, S4, gallops appreciable.  No heaves or rubs. EXTREMITIES:  Trace to 1+ pedal edema. GENERAL:  The patient alert and oriented.  No significant dyspnea, angina, or anginal equivalents.  I was called to evaluate the patient due to his chest x-ray findings. He has no fever, no real evidence of constitutional symptoms of infection, consideration was given to antibiotics, but due to renal failure, there is a limited selection.  It was felt that my judgment at this time that his CHF was markedly improved then that he will have dialysis within a 12-hour period in the a.m. as an outpatient.  The patient feels very confident to go home with his uncle.   He has no dyspnea and will follow up for dialysis in the morning and then followup with my office in 36 hours time to assess his hemodynamic status, his hemoglobin, potassium ,and so forth.  His hemoglobin was 9.1 in the ER, potassium was 5.7 which will be rectified by dialysis.     Melvyn Novas, MD     RMD/MEDQ  D:  08/01/2011  T:  08/02/2011  Job:  [id number]

## 2011-08-03 ENCOUNTER — Emergency Department (HOSPITAL_COMMUNITY)
Admission: EM | Admit: 2011-08-03 | Discharge: 2011-08-03 | Disposition: A | Discharge status: Home / Self Care | Payer: Medicare Other | Attending: Emergency Medicine | Admitting: Emergency Medicine

## 2011-08-03 ENCOUNTER — Encounter (HOSPITAL_COMMUNITY): Payer: Self-pay | Admitting: *Deleted

## 2011-08-03 DIAGNOSIS — M549 Dorsalgia, unspecified: Secondary | ICD-10-CM

## 2011-08-03 DIAGNOSIS — Z9861 Coronary angioplasty status: Secondary | ICD-10-CM | POA: Insufficient documentation

## 2011-08-03 DIAGNOSIS — F209 Schizophrenia, unspecified: Secondary | ICD-10-CM | POA: Insufficient documentation

## 2011-08-03 DIAGNOSIS — F319 Bipolar disorder, unspecified: Secondary | ICD-10-CM | POA: Insufficient documentation

## 2011-08-03 DIAGNOSIS — G894 Chronic pain syndrome: Secondary | ICD-10-CM | POA: Insufficient documentation

## 2011-08-03 DIAGNOSIS — F17200 Nicotine dependence, unspecified, uncomplicated: Secondary | ICD-10-CM | POA: Insufficient documentation

## 2011-08-03 DIAGNOSIS — I10 Essential (primary) hypertension: Secondary | ICD-10-CM | POA: Insufficient documentation

## 2011-08-03 DIAGNOSIS — I509 Heart failure, unspecified: Secondary | ICD-10-CM | POA: Insufficient documentation

## 2011-08-03 DIAGNOSIS — R0602 Shortness of breath: Secondary | ICD-10-CM | POA: Insufficient documentation

## 2011-08-03 DIAGNOSIS — Z7982 Long term (current) use of aspirin: Secondary | ICD-10-CM | POA: Insufficient documentation

## 2011-08-03 DIAGNOSIS — Z79899 Other long term (current) drug therapy: Secondary | ICD-10-CM | POA: Insufficient documentation

## 2011-08-03 MED ORDER — ONDANSETRON HCL 4 MG PO TABS
ORAL_TABLET | ORAL | Status: AC
  Filled 2011-08-03: qty 1

## 2011-08-03 MED ORDER — ONDANSETRON HCL 4 MG PO TABS
4.0000 mg | ORAL_TABLET | Freq: Once | ORAL | Status: AC
  Administered 2011-08-03: 15:00:00 via ORAL
  Filled 2011-08-03: qty 1

## 2011-08-03 MED ORDER — ACETAMINOPHEN-CODEINE #3 300-30 MG PO TABS
1.0000 | ORAL_TABLET | Freq: Once | ORAL | Status: AC
  Administered 2011-08-03: 1 via ORAL
  Filled 2011-08-03: qty 1

## 2011-08-03 MED ORDER — METHOCARBAMOL 500 MG PO TABS
1000.0000 mg | ORAL_TABLET | Freq: Once | ORAL | Status: AC
  Administered 2011-08-03: 1000 mg via ORAL
  Filled 2011-08-03: qty 2

## 2011-08-03 NOTE — ED Notes (Signed)
Fell 2 days ago, c/o back pain
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
## 2011-08-05 ENCOUNTER — Emergency Department (HOSPITAL_COMMUNITY): Payer: Medicare Other

## 2011-08-05 ENCOUNTER — Inpatient Hospital Stay (HOSPITAL_COMMUNITY)
Admission: EM | Admit: 2011-08-05 | Discharge: 2011-08-08 | DRG: 291 | Disposition: A | Discharge status: Home / Self Care | Payer: Medicare Other | Attending: Family Medicine | Admitting: Family Medicine

## 2011-08-05 ENCOUNTER — Encounter (HOSPITAL_COMMUNITY): Payer: Self-pay | Admitting: Emergency Medicine

## 2011-08-05 ENCOUNTER — Other Ambulatory Visit: Payer: Self-pay

## 2011-08-05 DIAGNOSIS — Z9861 Coronary angioplasty status: Secondary | ICD-10-CM

## 2011-08-05 DIAGNOSIS — I5043 Acute on chronic combined systolic (congestive) and diastolic (congestive) heart failure: Secondary | ICD-10-CM | POA: Diagnosis present

## 2011-08-05 DIAGNOSIS — IMO0001 Reserved for inherently not codable concepts without codable children: Principal | ICD-10-CM | POA: Diagnosis present

## 2011-08-05 DIAGNOSIS — Z9119 Patient's noncompliance with other medical treatment and regimen: Secondary | ICD-10-CM

## 2011-08-05 DIAGNOSIS — I509 Heart failure, unspecified: Secondary | ICD-10-CM | POA: Diagnosis present

## 2011-08-05 DIAGNOSIS — R0789 Other chest pain: Secondary | ICD-10-CM | POA: Diagnosis present

## 2011-08-05 DIAGNOSIS — F319 Bipolar disorder, unspecified: Secondary | ICD-10-CM | POA: Diagnosis present

## 2011-08-05 DIAGNOSIS — I2510 Atherosclerotic heart disease of native coronary artery without angina pectoris: Secondary | ICD-10-CM | POA: Diagnosis present

## 2011-08-05 DIAGNOSIS — J4489 Other specified chronic obstructive pulmonary disease: Secondary | ICD-10-CM | POA: Diagnosis present

## 2011-08-05 DIAGNOSIS — Z91199 Patient's noncompliance with other medical treatment and regimen due to unspecified reason: Secondary | ICD-10-CM

## 2011-08-05 DIAGNOSIS — Z992 Dependence on renal dialysis: Secondary | ICD-10-CM

## 2011-08-05 DIAGNOSIS — F209 Schizophrenia, unspecified: Secondary | ICD-10-CM | POA: Diagnosis present

## 2011-08-05 DIAGNOSIS — N186 End stage renal disease: Secondary | ICD-10-CM | POA: Diagnosis present

## 2011-08-05 DIAGNOSIS — I10 Essential (primary) hypertension: Secondary | ICD-10-CM | POA: Diagnosis present

## 2011-08-05 DIAGNOSIS — E785 Hyperlipidemia, unspecified: Secondary | ICD-10-CM | POA: Diagnosis present

## 2011-08-05 DIAGNOSIS — J449 Chronic obstructive pulmonary disease, unspecified: Secondary | ICD-10-CM | POA: Diagnosis present

## 2011-08-05 LAB — DIFFERENTIAL
Basophils Relative: 0 % (ref 0–1)
Lymphs Abs: 1.1 10*3/uL (ref 0.7–4.0)
Monocytes Relative: 7 % (ref 3–12)
Neutro Abs: 3.8 10*3/uL (ref 1.7–7.7)
Neutrophils Relative %: 71 % (ref 43–77)

## 2011-08-05 LAB — POCT I-STAT TROPONIN I

## 2011-08-05 LAB — MRSA PCR SCREENING: MRSA by PCR: NEGATIVE

## 2011-08-05 LAB — CBC
Hemoglobin: 9.5 g/dL — ABNORMAL LOW (ref 13.0–17.0)
RBC: 3.15 MIL/uL — ABNORMAL LOW (ref 4.22–5.81)
WBC: 5.4 10*3/uL (ref 4.0–10.5)

## 2011-08-05 LAB — BASIC METABOLIC PANEL
BUN: 23 mg/dL (ref 6–23)
Creatinine, Ser: 5.12 mg/dL — ABNORMAL HIGH (ref 0.50–1.35)
GFR calc Af Amer: 16 mL/min — ABNORMAL LOW (ref >60)
GFR calc non Af Amer: 13 mL/min — ABNORMAL LOW (ref >60)
Potassium: 4.7 mEq/L (ref 3.5–5.1)

## 2011-08-05 LAB — CARDIAC PANEL(CRET KIN+CKTOT+MB+TROPI): Relative Index: INVALID (ref 0.0–2.5)

## 2011-08-05 LAB — PRO B NATRIURETIC PEPTIDE: Pro B Natriuretic peptide (BNP): 70000 pg/mL — ABNORMAL HIGH (ref 0–125)

## 2011-08-05 MED ORDER — CARVEDILOL 12.5 MG PO TABS
12.5000 mg | ORAL_TABLET | Freq: Two times a day (BID) | ORAL | Status: DC
  Administered 2011-08-05 – 2011-08-08 (×7): 12.5 mg via ORAL
  Filled 2011-08-05 (×8): qty 1

## 2011-08-05 MED ORDER — LABETALOL HCL 100 MG PO TABS
300.0000 mg | ORAL_TABLET | Freq: Two times a day (BID) | ORAL | Status: DC
  Administered 2011-08-05 – 2011-08-08 (×6): 300 mg via ORAL
  Filled 2011-08-05 (×11): qty 3

## 2011-08-05 MED ORDER — ROSUVASTATIN CALCIUM 20 MG PO TABS
20.0000 mg | ORAL_TABLET | Freq: Every day | ORAL | Status: DC
  Administered 2011-08-05 – 2011-08-08 (×4): 20 mg via ORAL
  Filled 2011-08-05 (×5): qty 1

## 2011-08-05 MED ORDER — ACETAMINOPHEN 500 MG PO TABS
1000.0000 mg | ORAL_TABLET | Freq: Once | ORAL | Status: AC
  Administered 2011-08-05: 1000 mg via ORAL
  Filled 2011-08-05: qty 2

## 2011-08-05 MED ORDER — CLOPIDOGREL BISULFATE 75 MG PO TABS
75.0000 mg | ORAL_TABLET | Freq: Every day | ORAL | Status: DC
  Administered 2011-08-06 – 2011-08-07 (×2): 75 mg via ORAL
  Filled 2011-08-05 (×3): qty 1

## 2011-08-05 MED ORDER — POTASSIUM CHLORIDE CRYS ER 20 MEQ PO TBCR
20.0000 meq | EXTENDED_RELEASE_TABLET | Freq: Every day | ORAL | Status: DC
  Administered 2011-08-05: 20 meq via ORAL
  Filled 2011-08-05: qty 1

## 2011-08-05 MED ORDER — ENOXAPARIN SODIUM 100 MG/ML ~~LOC~~ SOLN
1.0000 mg/kg | SUBCUTANEOUS | Status: DC
  Administered 2011-08-05 – 2011-08-06 (×2): 80 mg via SUBCUTANEOUS
  Filled 2011-08-05 (×3): qty 1

## 2011-08-05 MED ORDER — SODIUM CHLORIDE 0.9 % IJ SOLN
3.0000 mL | Freq: Two times a day (BID) | INTRAMUSCULAR | Status: DC
  Administered 2011-08-05 – 2011-08-07 (×6): 3 mL via INTRAVENOUS
  Filled 2011-08-05 (×4): qty 3

## 2011-08-05 MED ORDER — HYDROMORPHONE HCL 1 MG/ML IJ SOLN
1.0000 mg | Freq: Once | INTRAMUSCULAR | Status: AC
  Administered 2011-08-05: 1 mg via INTRAVENOUS
  Filled 2011-08-05: qty 1

## 2011-08-05 MED ORDER — HYDROXYZINE HCL 25 MG PO TABS
25.0000 mg | ORAL_TABLET | Freq: Three times a day (TID) | ORAL | Status: DC | PRN
  Filled 2011-08-05: qty 1

## 2011-08-05 MED ORDER — HYDROCODONE-ACETAMINOPHEN 5-325 MG PO TABS
1.0000 | ORAL_TABLET | Freq: Once | ORAL | Status: AC
  Administered 2011-08-05: 1 via ORAL
  Filled 2011-08-05: qty 1

## 2011-08-05 MED ORDER — LIDOCAINE-PRILOCAINE 2.5-2.5 % EX CREA
1.0000 "application " | TOPICAL_CREAM | CUTANEOUS | Status: DC | PRN
  Filled 2011-08-05: qty 5

## 2011-08-05 MED ORDER — LISINOPRIL 10 MG PO TABS
20.0000 mg | ORAL_TABLET | Freq: Every day | ORAL | Status: DC
  Administered 2011-08-05 – 2011-08-08 (×4): 20 mg via ORAL
  Filled 2011-08-05 (×5): qty 2

## 2011-08-05 MED ORDER — ASPIRIN 81 MG PO CHEW
81.0000 mg | CHEWABLE_TABLET | Freq: Every day | ORAL | Status: DC
  Administered 2011-08-05 – 2011-08-08 (×4): 81 mg via ORAL
  Filled 2011-08-05 (×4): qty 1

## 2011-08-05 MED ORDER — HEPARIN SODIUM (PORCINE) 1000 UNIT/ML DIALYSIS
500.0000 [IU]/h | INTRAMUSCULAR | Status: DC | PRN
  Administered 2011-08-06 (×3): 500 [IU]/h via INTRAVENOUS_CENTRAL
  Filled 2011-08-05: qty 1

## 2011-08-05 MED ORDER — CLONIDINE HCL 0.2 MG PO TABS
0.2000 mg | ORAL_TABLET | Freq: Two times a day (BID) | ORAL | Status: DC
  Administered 2011-08-05 – 2011-08-07 (×4): 0.2 mg via ORAL
  Filled 2011-08-05 (×4): qty 1

## 2011-08-05 MED ORDER — HEPARIN SODIUM (PORCINE) 1000 UNIT/ML DIALYSIS
2000.0000 [IU] | Freq: Once | INTRAMUSCULAR | Status: AC
  Administered 2011-08-06: 2000 [IU] via INTRAVENOUS_CENTRAL
  Filled 2011-08-05: qty 2

## 2011-08-05 MED ORDER — SEVELAMER CARBONATE 800 MG PO TABS
800.0000 mg | ORAL_TABLET | Freq: Three times a day (TID) | ORAL | Status: DC
  Administered 2011-08-05 – 2011-08-08 (×10): 800 mg via ORAL
  Filled 2011-08-05 (×11): qty 1

## 2011-08-05 MED ORDER — NITROGLYCERIN 0.4 MG SL SUBL
0.4000 mg | SUBLINGUAL_TABLET | SUBLINGUAL | Status: AC | PRN
  Administered 2011-08-05 (×3): 0.4 mg via SUBLINGUAL
  Filled 2011-08-05: qty 75

## 2011-08-05 MED ORDER — SODIUM CHLORIDE 0.9 % IV SOLN: 100.0000 mL | INTRAVENOUS | Status: DC | PRN

## 2011-08-05 MED ORDER — CLOPIDOGREL BISULFATE 75 MG PO TABS
75.0000 mg | ORAL_TABLET | Freq: Every day | ORAL | Status: DC
  Administered 2011-08-08: 75 mg via ORAL

## 2011-08-05 MED ORDER — OLANZAPINE 5 MG PO TABS
10.0000 mg | ORAL_TABLET | Freq: Every day | ORAL | Status: DC
  Administered 2011-08-05 – 2011-08-07 (×3): 10 mg via ORAL
  Filled 2011-08-05 (×4): qty 2

## 2011-08-05 MED ORDER — SODIUM CHLORIDE 0.9 % IJ SOLN: 3.0000 mL | INTRAMUSCULAR | Status: DC | PRN

## 2011-08-05 MED ORDER — HYDRALAZINE HCL 25 MG PO TABS
50.0000 mg | ORAL_TABLET | Freq: Three times a day (TID) | ORAL | Status: DC
  Administered 2011-08-05 – 2011-08-07 (×6): 50 mg via ORAL
  Filled 2011-08-05 (×8): qty 2

## 2011-08-05 MED ORDER — NITROGLYCERIN 2 % TD OINT
1.0000 [in_us] | TOPICAL_OINTMENT | Freq: Once | TRANSDERMAL | Status: AC
  Administered 2011-08-05: 1 [in_us] via TOPICAL
  Filled 2011-08-05: qty 1

## 2011-08-05 MED ORDER — AMLODIPINE BESYLATE 5 MG PO TABS
10.0000 mg | ORAL_TABLET | Freq: Every day | ORAL | Status: DC
  Administered 2011-08-05 – 2011-08-08 (×4): 10 mg via ORAL
  Filled 2011-08-05: qty 1
  Filled 2011-08-05 (×3): qty 2

## 2011-08-05 NOTE — H&P (Signed)
[id number] 
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
## 2011-08-05 NOTE — ED Notes (Signed)
C/o sudden onset of chest pain with shortness of breath 3 hours ago-states this pain awoke him from sleep; reports pain is diffuse to chest, but tender to palpation left sternal border; reports pain worse with deep breaths; describes as "sharp and just hurts".  Pt is hemodialysis pt T, TH, SAT, and states he rec'd dialysis yesterday; a&ox4; answers questions appropriately.

## 2011-08-05 NOTE — ED Notes (Signed)
Nitroglycerin x 3 given SL; denies change in pain and now c/o severe HA. Will notify EDP.

## 2011-08-05 NOTE — ED Notes (Signed)
Denies change in chest pain; EDP notified; also now c/o epigastric pain.
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
## 2011-08-05 NOTE — ED Notes (Signed)
Patient requesting something for pain. EDP made aware-order to be given.  

## 2011-08-05 NOTE — ED Notes (Signed)
Pt c/o chest pain for 3 hours with sob.
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
## 2011-08-06 ENCOUNTER — Inpatient Hospital Stay (HOSPITAL_COMMUNITY): Payer: Medicare Other

## 2011-08-06 LAB — BASIC METABOLIC PANEL
BUN: 31 mg/dL — ABNORMAL HIGH (ref 6–23)
Chloride: 95 mEq/L — ABNORMAL LOW (ref 96–112)
Creatinine, Ser: 6.96 mg/dL — ABNORMAL HIGH (ref 0.50–1.35)
GFR calc Af Amer: 11 mL/min — ABNORMAL LOW (ref >60)
Glucose, Bld: 93 mg/dL (ref 70–99)

## 2011-08-06 LAB — CARDIAC PANEL(CRET KIN+CKTOT+MB+TROPI): CK, MB: 2.7 ng/mL (ref 0.3–4.0)

## 2011-08-06 LAB — CBC
HCT: 27.5 % — ABNORMAL LOW (ref 39.0–52.0)
MCH: 29.9 pg (ref 26.0–34.0)
MCHC: 33.5 g/dL (ref 30.0–36.0)
RDW: 17.9 % — ABNORMAL HIGH (ref 11.5–15.5)

## 2011-08-06 LAB — PROTIME-INR: Prothrombin Time: 14.8 seconds (ref 11.6–15.2)

## 2011-08-06 MED ORDER — HYDROCODONE-ACETAMINOPHEN 5-325 MG PO TABS
1.0000 | ORAL_TABLET | Freq: Four times a day (QID) | ORAL | Status: DC | PRN
  Administered 2011-08-06 – 2011-08-08 (×8): 1 via ORAL
  Filled 2011-08-06 (×8): qty 1

## 2011-08-06 MED ORDER — ACETAMINOPHEN 500 MG PO TABS: 500.0000 mg | ORAL_TABLET | ORAL | Status: DC | PRN

## 2011-08-06 MED ORDER — EPOETIN ALFA 10000 UNIT/ML IJ SOLN
10000.0000 [IU] | INTRAMUSCULAR | Status: DC
  Administered 2011-08-06: 10000 [IU] via INTRAVENOUS
  Filled 2011-08-06 (×3): qty 1

## 2011-08-06 NOTE — Progress Notes (Signed)
[id number] 

## 2011-08-06 NOTE — Progress Notes (Signed)
Subjective: I patient her still complains of  substernal chest pain . Her presently is about a 2/ 10 nonradiating  He denies any nausea or vomiting. Patient also doesn't have any her shortness of breath orthopnea or paroxysmal nocturnal dyspnea.  Objective: Vital signs in last 24 hours: Temp:  [97.8 F (36.6 C)-98.6 F (37 C)] 97.8 F (36.6 C) (08/18 0400) Pulse Rate:  [64-89] 74  (08/18 0600) Resp:  [12-28] 15  (08/18 0600) BP: (147-183)/(73-133) 169/123 mmHg (08/18 0640) SpO2:  [88 %-100 %] 93 % (08/18 0600) Weight:  [81.647 kg (180 lb)-82.8 kg (182 lb 8.7 oz)] 182 lb 1.6 oz (82.6 kg) (08/18 0455) Weight change:   Intake/Output from previous day: 08/17 0701 - 08/18 0700 In: 3 [I.V.:3] Out: 300 [Urine:300] Intake/Output this shift:    General appearance: alert Resp: clear to auscultation bilaterally Cardio: regular rate and rhythm, S1, S2 normal, no murmur, click, rub or gallop GI: soft, non-tender; bowel sounds normal; no masses,  no organomegaly Extremities: extremities normal, atraumatic, no cyanosis or edema  Lab Results:  [facility] 08/06/11 0509 08/05/11 0921  WBC 5.6 5.4  HGB 9.2* 9.5*  HCT 27.5* 28.0*  PLT 200 221   BMET:  Basename 08/06/11 0509 08/05/11 0921  NA 133* 133*  K 5.2* 4.7  CL 95* 94*  CO2 26 27  GLUCOSE 93 96  BUN 31* 23  CREATININE 6.96* 5.12*  CALCIUM 10.3 10.7*   No results found for this basename: PTH:2 in the last 72 hours Iron Studies: No results found for this basename: IRON,TIBC,TRANSFERRIN,FERRITIN in the last 72 hours  Studies/Results: Dg Chest Portable 1 View  08/05/2011  *RADIOLOGY REPORT*  Clinical Data: Chest pain, shortness of breath  PORTABLE CHEST - 1 VIEW  Comparison: 08/01/2011  Findings: Cardiomegaly with pulmonary vascular congestion and suspected mild interstitial edema.  Mild patchy opacity in the right lower lobe is favored to represent asymmetric edema.  No pleural effusion or pneumothorax.  IMPRESSION: Cardiomegaly  with suspected mild interstitial edema.  Mild patchy opacity in the right lower lobe, favored to represent asymmetric edema.  Original Report Authenticated By: Charline Bills, M.D.    I have reviewed the patient's current medications.  Assessment/Plan: Problem #1 end-stage renal disease is status post hemodialysis on a Tuesday patient is presently due for dialysis today. Is pending creatinine at this moment is stable with an acceptable range a normal potassium. Problem #2 chest pain at this moment is still he has substernal chest pain without her and any radiation: Slightly high but no significant change and. However the patient was multiple her her significant risk factor for cardiac problems. Her at this moment seems to be possibly noncardiac. Problem #3 history of hypertension patient on multiple medication is not clear still slightly high. Problem #4 her history of coronary artery status post stent placement notify me for her. Problem #5 a history of anemia secondary to end-stage renal disease is on Epogen H&H is stable Problem #6 history of COPD is on inhaler continues to smoke Problem #7 history of bipolar disorder.  Recommendation we'll make her arrangements for patient to get hemodialysis today. We'll try to remove the about 4 L in the we'll continue his other medications as before. This is end of her her followup thank you  LOS: 1 day   [facility] S 08/06/2011,8:01 AM

## 2011-08-06 NOTE — Progress Notes (Signed)
ANTICOAGULATION CONSULT NOTE Pharmacy Consult for Enoxaparin Indication: chest pain/ACS  Patient Measurements: Height: 5\' 8"  (172.7 cm) Weight: 182 lb 1.6 oz (82.6 kg) IBW/kg (Calculated) : 68.4   Labs:  Basename 08/06/11 0509 08/06/11 0006 08/05/11 1601 08/05/11 0921  HGB 9.2* -- -- 9.5*  HCT 27.5* -- -- 28.0*  PLT 200 -- -- 221  APTT -- -- -- --  LABPROT 14.8 -- -- --  INR 1.14 -- -- --  HEPARINUNFRC -- -- -- --  CREATININE 6.96* -- -- 5.12*  CRCLEARANCE -- -- -- --  CKTOTAL -- 71 83 --  CKMB -- 2.6 2.5 --  TROPONINI -- 0.30* 0.36* --     Plan:  Continue Lovenox 1mg  per kg every 24 hours (80mg ) CBC every 72hrs while on Lovenox. Monitor PLTC  [name] 08/06/2011,8:01 AM

## 2011-08-06 NOTE — Progress Notes (Signed)
NAME:  MD, SMOLA NO.:  [id number]  MEDICAL RECORD NO.:  [id number]  LOCATION:  IC05                          FACILITY:  [facility]  PHYSICIAN:  Melvyn Novas, MDDATE OF BIRTH:  07/17/1976  DATE OF PROCEDURE:  08/06/2011 DATE OF DISCHARGE:                                PROGRESS NOTE   The patient has initial troponin elevated, secondary troponin less than 0.3, within normal limits.  We will continue troponins, cardiac enzymes daily for 2 days.  No complaints of anginal chest pain or anginal equivalents.  PHYSICAL EXAMINATION:  VITAL SIGNS:  Blood pressure on monitor undergoing dialysis is 162/88, pulse rate is 88 and regular.  He is afebrile.  Respiratory rate is 20. NECK:  No JVD.  No carotid bruits.  No thyromegaly or bruits. LUNGS:  Diminished breath sounds at bases.  Scattered rhonchi.  No rales appreciable.  No wheeze audible. HEART:  Regular rhythm.  No S3, S4, gallops, heaves, thrills, or rubs. ABDOMEN:  Essentially benign.  Current problems are still accelerated hypertension.  He has had all of his antihypertensive medicines reinstituted approximately 24 hours ago. Blood pressure is improved, although not optimal at present.  The plan is to continue dialysis today and perhaps tomorrow.  Continue cardiac enzymes to look for angina or ischemic evidence and treat accordingly, and we will make further recommendations as the database expands.     Melvyn Novas, MD     RMD/MEDQ  D:  08/06/2011  T:  08/06/2011  Job:  [id number]

## 2011-08-06 NOTE — Plan of Care (Signed)
Outpatient plan of care requested from [facility]  Pt. Education, controlling fluids provided  Received report from Loleta Chance, RN

## 2011-08-06 NOTE — H&P (Signed)
NAME:  [name], [name] NO.:  [id number]  MEDICAL RECORD NO.:  [id number]  LOCATION:  IC05                          FACILITY:  [facility]  PHYSICIAN:  Melvyn Novas, MDDATE OF BIRTH:  Aug 13, 1976  DATE OF ADMISSION:  08/05/2011 DATE OF DISCHARGE:  LH                             HISTORY & PHYSICAL   The patient is a 34-year white male with recently discharged 2 days prior to admission with 2-vessel ischemic cardiomyopathy, ejection fraction of 40-45%, CAD stenting to one of those vessels, chronic hypertension, COPD, end-stage renal disease on dialysis, hyperlipidemia, schizophrenia bipolarity, chronic noncompliance who had dialysis he states for the full period yesterday.  The patient states he was awoken this morning with some localized retrosternal chest sharp pain.  He denies pressure-like pain.  He denies any exertional component, any nausea associated diaphoresis.  He presents to the ER with accelerated hypertension again 170/104.  Chest x-ray consistent with volume overload, CHF, and right greater than left patchy infiltrates in the lower lobe, consistent with failure.  Troponin was done in spring was elevated at 0.27 and BNP was grossly elevated at 70,000.  The patient also describes some paresthesias of his right shoulder and arm which began intermittently throughout the day, lasted for a period of 10 minutes with 3 separate episodes.  He denies sputum or hemoptysis.  He does have a cough.  He does have orthopnea.  The patient was admitted to ICU with chronic acute on chronic congestive heart failure.  We will get serial cardiac enzymes to rule out any ischemic component.  We will anticoagulate the patient and we will obtain an urgent dialysis.  PAST MEDICAL HISTORY:  Significant for the aforementioned 2-vessel coronary disease, ischemic cardiomyopathy, ejection fraction 40-45%, bipolarity, chronic noncompliance, accelerated hypertension,  COPD, continued tobacco abuse, hyperlipidemia.  SOCIAL HISTORY:  He lives next to his uncle and mother in an apartment. He smokes a pack per day.  He is chronically noncompliant with his dialysis and medicines.  DISCHARGE MEDICINES: 1. Aspirin 81 mg p.o. daily. 2. Labetalol 400 mg p.o. b.i.d. 3. Prinivil 20 mg p.o. daily. 4. Hydralazine 50 mg p.o. t.i.d. 5. Crestor 20 mg p.o. daily. 6. Cymbalta 60 mg p.o. daily. 7. Coreg 12.5 mg p.o. b.i.d. 8. Norvasc 10 mg p.o. daily. 9. Zyprexa 10 mg p.o. b.i.d. 10.Xanax 0.25 mg p.o. t.i.d. 11.Clonidine 0.1 mg p.o. b.i.d. 12.Plavix 75 mg p.o. daily. 13.Renvela 1 tablet p.o. t.i.d.  REVIEW OF SYSTEMS:  Negative for melena, hematochezia, cough, seizures, sputum, hemoptysis, weight loss.  FAMILY HISTORY:  Noncontributory at present.  PHYSICAL EXAMINATION:  VITAL SIGNS:  At present, 174/96, respiratory rate is 18-20.  He is afebrile, O2 sat is 98% on room air. HEENT:  Eyes, PERRLA.  Extraocular movements are intact.  Sclerae clear. Conjunctivae pink. NECK:  Shows no JVD, no carotid bruits with 45 degrees of angulation. LUNGS:  Showed diminished breath sounds at the bases with minimal bibasilar rales, chronic rhonchi, prolonged respiratory phase. HEART:  Regular rhythm.  S4 auscultated.  No S3 auscultated.  No murmurs, heaves, or rubs. ABDOMEN:  Soft, nontender.  Bowel sounds normoactive.  No guarding, rebound, or hepatosplenomegaly. EXTREMITIES:  Trace 2+  chronic pedal edema. NEUROLOGIC:  The patient is alert and oriented.  Cranial nerves grossly intact.  The patient moves all 4 extremities.  IMPRESSION: 1. Accelerated hypertension. 2. Congestive heart failure, volume overload per accelerated     hypertension, end-stage renal disease, and ischemic cardiomyopathy. 3. Ischemic cardiomyopathy. 4. Chronic obstructive pulmonary disease. 5. Hyperlipidemia. 6. Two-vessel coronary artery disease status post stenting. 7. Chronic  noncompliance. 8. Bipolarity 9. Schizophrenia.  PLAN:  To admit to ICU.  Obtain Renal consult for possible dialysis should they deem clinically necessary.  We will continue his current medicines and Lovenox and I will make further recommendations as the database expands.     Melvyn Novas, MD     RMD/MEDQ  D:  08/05/2011  T:  08/06/2011  Job:  [id number]

## 2011-08-07 LAB — CARDIAC PANEL(CRET KIN+CKTOT+MB+TROPI)
CK, MB: 2.1 ng/mL (ref 0.3–4.0)
Relative Index: INVALID (ref 0.0–2.5)
Relative Index: INVALID (ref 0.0–2.5)
Total CK: 63 U/L (ref 7–232)
Troponin I: <0.3 ng/mL (ref <0.30)
Troponin I: 0.3 ng/mL (ref <0.30)

## 2011-08-07 LAB — BASIC METABOLIC PANEL
BUN: 22 mg/dL (ref 6–23)
Calcium: 10.6 mg/dL — ABNORMAL HIGH (ref 8.4–10.5)
Chloride: 92 mEq/L — ABNORMAL LOW (ref 96–112)
Creatinine, Ser: 4.91 mg/dL — ABNORMAL HIGH (ref 0.50–1.35)
GFR calc Af Amer: 16 mL/min — ABNORMAL LOW (ref >60)
GFR calc non Af Amer: 13 mL/min — ABNORMAL LOW (ref >60)

## 2011-08-07 LAB — PROTIME-INR: INR: 1.08 (ref 0.00–1.49)

## 2011-08-07 MED ORDER — ENOXAPARIN SODIUM 80 MG/0.8ML ~~LOC~~ SOLN
1.0000 mg/kg | SUBCUTANEOUS | Status: DC
  Administered 2011-08-07: 80 mg via SUBCUTANEOUS
  Filled 2011-08-07: qty 0.8

## 2011-08-07 MED ORDER — HYDRALAZINE HCL 25 MG PO TABS
75.0000 mg | ORAL_TABLET | Freq: Three times a day (TID) | ORAL | Status: DC
  Administered 2011-08-07 – 2011-08-08 (×4): 75 mg via ORAL
  Filled 2011-08-07: qty 3
  Filled 2011-08-07: qty 2
  Filled 2011-08-07 (×2): qty 3

## 2011-08-07 MED ORDER — CLONIDINE HCL 0.2 MG PO TABS
0.2000 mg | ORAL_TABLET | Freq: Three times a day (TID) | ORAL | Status: DC
  Administered 2011-08-07 – 2011-08-08 (×4): 0.2 mg via ORAL
  Filled 2011-08-07 (×4): qty 1

## 2011-08-07 NOTE — Progress Notes (Signed)
After asking the pt not to smoke in his room, I went into the pts room to administer medication and he room was filled with smoke. You could smell the smoke in the halls.  I notified security and asked them to come up and speak with the patient about not smoking.    Security came up to the room and spoke with him, and stated if we had any further problems then call them back.

## 2011-08-07 NOTE — Progress Notes (Signed)
NAME:  JEVON, SHELLS NO.:  [id number]  MEDICAL RECORD NO.:  [id number]  LOCATION:  A336                          FACILITY:  [facility]  PHYSICIAN:  Melvyn Novas, MDDATE OF BIRTH:  [birth year]  DATE OF PROCEDURE: DATE OF DISCHARGE:                                PROGRESS NOTE   Last two troponins were below 0.30 which is normal.  Initial one was mildly elevated.  No angina or anginal equivalents at present.  In the last 24 hours, the patient has some dyspnea.  NECK:  No JVD at 45 degrees.  No carotid bruits, no thyromegaly or thyroid bruits. LUNGS:  Diminished breath sounds at bases, scattered rhonchi.  No rales appreciable. HEART:  Regular rate and rhythm.  No S3 or S4.  No heaves, thrills, or rubs. GENERAL:  The patient is alert and oriented. VITAL SIGNS:  Blood pressure at present is 170/93, pulse is 83 and regular.  He is afebrile.  Plan at present is to increase the frequency of clonidine 0.2 b.i.d. to t.i.d.  Continue all current medicines.  Get BMET in a.m. and perhaps dialysis in a.m. if deemed clinically necessary.  We will consider cardiology consultation depending on clinical course in a.m.     Melvyn Novas, MD     RMD/MEDQ  D:  08/07/2011  T:  08/07/2011  Job:  [id number]

## 2011-08-07 NOTE — Progress Notes (Signed)
[id number] 

## 2011-08-07 NOTE — Progress Notes (Signed)
Subjective: Interval History: has no complaint of her chest pain shortness of breath or orthopnea. He denies also any nausea vomiting..  Objective: Vital signs in last 24 hours: Temp:  [97.5 F (36.4 C)-98.2 F (36.8 C)] 98 F (36.7 C) (08/19 0558) Pulse Rate:  [63-101] 88  (08/19 0558) Resp:  [16-21] 16  (08/19 0558) BP: (150-202)/(78-106) 173/90 mmHg (08/19 0558) SpO2:  [94 %-100 %] 94 % (08/19 0558) Weight change: 0.452 kg (1 lb)  Intake/Output from previous day:   Intake/Output this shift: I/O this shift: In: 240 [P.O.:240] Out: -   General appearance: alert Resp: clear to auscultation bilaterally Cardio: regular rate and rhythm, S1, S2 normal, no murmur, click, rub or gallop GI: soft, non-tender; bowel sounds normal; no masses,  no organomegaly Extremities: extremities normal, atraumatic, no cyanosis or edema  Lab Results:  [facility] 08/06/11 0509 08/05/11 0921  WBC 5.6 5.4  HGB 9.2* 9.5*  HCT 27.5* 28.0*  PLT 200 221   BMET:  Basename 08/07/11 0424 08/06/11 0509  NA 134* 133*  K 4.4 5.2*  CL 92* 95*  CO2 29 26  GLUCOSE 93 93  BUN 22 31*  CREATININE 4.91* 6.96*  CALCIUM 10.6* 10.3   No results found for this basename: PTH:2 in the last 72 hours Iron Studies: No results found for this basename: IRON,TIBC,TRANSFERRIN,FERRITIN in the last 72 hours  Studies/Results: No results found.  I have reviewed the patient's current medications.  Assessment/Plan: Problem #1 end-stage renal disease is status post hemodialysis yesterday her his pending creatinine was in acceptable range normal potassium and patient doesn't have any uremic sinus symptoms. Problem #2 her problem her a coronary artery disease patient was chest pain he strained his side getting better. His chest pain is 2/10 today and there's no radiation. Problem #3 hypertension his blood pressure seems to be slightly high but stable Problem #4 history of COPD he is on inhaler Problem #5 history of her  anemia is on Epogen H&H stable Problem #6 history of CHF is status post ultrafiltration of 4 L presently he doesn't have any sign of fluid overload. Problem #7 history of hypercholesterolemia he is on simvastatin. Problem #8 history of her chronic pain.  Recommendation we'll increase her his hydralazine to 75 mg by mouth twice a day We'll continue his other medication as before. And this is end of her followup thank you  LOS: 2 days   Jayline Kilburg S 08/07/2011,1:31 PM

## 2011-08-08 LAB — PROTIME-INR: Prothrombin Time: 14.3 seconds (ref 11.6–15.2)

## 2011-08-08 LAB — CARDIAC PANEL(CRET KIN+CKTOT+MB+TROPI)
Relative Index: INVALID (ref 0.0–2.5)
Troponin I: <0.3 ng/mL (ref <0.30)

## 2011-08-08 MED ORDER — CARVEDILOL 12.5 MG PO TABS: 12.5000 mg | ORAL_TABLET | Freq: Two times a day (BID) | ORAL | Status: DC

## 2011-08-08 MED ORDER — SODIUM CHLORIDE 0.9 % IJ SOLN
INTRAMUSCULAR | Status: AC
  Filled 2011-08-08: qty 20

## 2011-08-08 NOTE — Progress Notes (Signed)
Subjective: Interval History: has no complaint of chest pain  also shortness of breath.  Objective: Vital signs in last 24 hours: Temp:  [97.4 F (36.3 C)-98.1 F (36.7 C)] 97.4 F (36.3 C) (08/20 0600) Pulse Rate:  [68-84] 70  (08/20 0600) Resp:  [16-18] 16  (08/20 0600) BP: (153-172)/(81-99) 172/99 mmHg (08/20 0600) SpO2:  [92 %-96 %] 96 % (08/20 0600) Weight change:   Intake/Output from previous day: 08/19 0701 - 08/20 0700 In: 480 [P.O.:480] Out: -  Intake/Output this shift:    General appearance: alert Resp: clear to auscultation bilaterally History exam revealed her regular rate and rhythm no murmur S3 Abdomen soft positive bowel sound Extremities she doesn't have any edema. Lab Results:  Basename 08/06/11 0509 08/05/11 0921  WBC 5.6 5.4  HGB 9.2* 9.5*  HCT 27.5* 28.0*  PLT 200 221   BMET:  Basename 08/07/11 0424 08/06/11 0509  NA 134* 133*  K 4.4 5.2*  CL 92* 95*  CO2 29 26  GLUCOSE 93 93  BUN 22 31*  CREATININE 4.91* 6.96*  CALCIUM 10.6* 10.3   No results found for this basename: PTH:2 in the last 72 hours Iron Studies: No results found for this basename: IRON,TIBC,TRANSFERRIN,FERRITIN in the last 72 hours  Studies/Results: No results found.  I have reviewed the patient's current medications.  Assessment/Plan: Problem #1 chest pain presently patient seems to be feeling better. No history of  difficulty increasing.  Problem #2 her history of anemia is on Epogen H&H stable Problem #3 history of her blood pressure seems to be somewhat better. Problem #4 History of her bipolar disorder Problem #5 history of coronary heart disease is a symptomatic. Problem #6 history of her COPD patient continues to smoke. Problem #7 history of hyperphosphatemia her patient is on a binder. Her problem #8 history of her CHF he is status post UFR her 4 l it is on Saturday presently he doesn't have any sign of fluid overload. Recommendation we'll make arrangements for  patient to get dialysis tomorrow is going to be discharged patient advised to continue his dialysis in the center. And this is end  followup thank you    LOS: 3 days   Gabrial Poppell S 08/08/2011,8:11 AM

## 2011-08-08 NOTE — Progress Notes (Signed)
Pt discharged home with instructions and verbalized understanding.  There are no new prescriptions at this time. Pt left the floor ambulating with staff in stable condition.

## 2011-08-08 NOTE — Progress Notes (Signed)
ANTICOAGULATION CONSULT NOTE Pharmacy Consult for Enoxaparin Indication: chest pain/ACS  Patient Measurements: Height: 5\' 8"  (172.7 cm) Weight: 172 lb 2.9 oz (78.1 kg) IBW/kg (Calculated) : 68.4   Labs:  Basename 08/08/11 0522 08/08/11 0520 08/07/11 0424 08/06/11 0804 08/06/11 0509  HGB -- -- -- -- 9.2*  HCT -- -- -- -- 27.5*  PLT -- -- -- -- 200  APTT -- -- -- -- --  LABPROT -- 14.3 14.2 -- 14.8  INR -- 1.09 1.08 -- 1.14  HEPARINUNFRC -- -- -- -- --  CREATININE -- -- 4.91* -- 6.96*  CRCLEARANCE -- -- -- -- --  CKTOTAL 57 -- 63 76 --  CKMB 2.1 -- 2.1 2.7 --  TROPONINI <0.30 -- <0.30 <0.30 --     Plan:  Continue Lovenox 1mg  per kg every 24 hours (80mg ) CBC every 72hrs while on Lovenox. Monitor PLTC  Margo Aye, Deke Tilghman A 08/08/2011,9:53 AM

## 2011-08-08 NOTE — Discharge Summary (Signed)
[id number] 

## 2011-08-08 NOTE — Progress Notes (Signed)
UR Chart Review Completed  

## 2011-08-08 NOTE — Progress Notes (Signed)
[id number] 

## 2011-08-08 NOTE — Discharge Summary (Signed)
  [id number] 

## 2011-08-09 ENCOUNTER — Emergency Department (HOSPITAL_COMMUNITY): Payer: Medicare Other

## 2011-08-09 ENCOUNTER — Encounter (HOSPITAL_COMMUNITY): Payer: Self-pay

## 2011-08-09 ENCOUNTER — Encounter (HOSPITAL_COMMUNITY): Payer: Self-pay | Admitting: *Deleted

## 2011-08-09 ENCOUNTER — Emergency Department (HOSPITAL_COMMUNITY)
Admission: EM | Admit: 2011-08-09 | Discharge: 2011-08-09 | Disposition: A | Discharge status: Home / Self Care | Payer: Medicare Other | Attending: Emergency Medicine | Admitting: Emergency Medicine

## 2011-08-09 DIAGNOSIS — Z79899 Other long term (current) drug therapy: Secondary | ICD-10-CM | POA: Insufficient documentation

## 2011-08-09 DIAGNOSIS — R110 Nausea: Secondary | ICD-10-CM | POA: Insufficient documentation

## 2011-08-09 DIAGNOSIS — I10 Essential (primary) hypertension: Secondary | ICD-10-CM | POA: Insufficient documentation

## 2011-08-09 DIAGNOSIS — Z888 Allergy status to other drugs, medicaments and biological substances status: Secondary | ICD-10-CM | POA: Insufficient documentation

## 2011-08-09 DIAGNOSIS — R079 Chest pain, unspecified: Secondary | ICD-10-CM | POA: Insufficient documentation

## 2011-08-09 DIAGNOSIS — Z9861 Coronary angioplasty status: Secondary | ICD-10-CM | POA: Insufficient documentation

## 2011-08-09 DIAGNOSIS — R1013 Epigastric pain: Secondary | ICD-10-CM | POA: Insufficient documentation

## 2011-08-09 DIAGNOSIS — F17200 Nicotine dependence, unspecified, uncomplicated: Secondary | ICD-10-CM | POA: Insufficient documentation

## 2011-08-09 DIAGNOSIS — I509 Heart failure, unspecified: Secondary | ICD-10-CM | POA: Insufficient documentation

## 2011-08-09 DIAGNOSIS — R1011 Right upper quadrant pain: Secondary | ICD-10-CM | POA: Insufficient documentation

## 2011-08-09 DIAGNOSIS — F209 Schizophrenia, unspecified: Secondary | ICD-10-CM | POA: Insufficient documentation

## 2011-08-09 DIAGNOSIS — Z992 Dependence on renal dialysis: Secondary | ICD-10-CM | POA: Insufficient documentation

## 2011-08-09 DIAGNOSIS — N289 Disorder of kidney and ureter, unspecified: Secondary | ICD-10-CM | POA: Insufficient documentation

## 2011-08-09 DIAGNOSIS — R109 Unspecified abdominal pain: Secondary | ICD-10-CM

## 2011-08-09 DIAGNOSIS — M549 Dorsalgia, unspecified: Secondary | ICD-10-CM | POA: Insufficient documentation

## 2011-08-09 DIAGNOSIS — F319 Bipolar disorder, unspecified: Secondary | ICD-10-CM | POA: Insufficient documentation

## 2011-08-09 DIAGNOSIS — I519 Heart disease, unspecified: Secondary | ICD-10-CM | POA: Insufficient documentation

## 2011-08-09 DIAGNOSIS — Z7982 Long term (current) use of aspirin: Secondary | ICD-10-CM | POA: Insufficient documentation

## 2011-08-09 LAB — CBC
HCT: 29.7 % — ABNORMAL LOW (ref 39.0–52.0)
Hemoglobin: 9.7 g/dL — ABNORMAL LOW (ref 13.0–17.0)
MCH: 29.8 pg (ref 26.0–34.0)
MCHC: 32.7 g/dL (ref 30.0–36.0)
MCV: 91.1 fL (ref 78.0–100.0)
RBC: 3.26 MIL/uL — ABNORMAL LOW (ref 4.22–5.81)

## 2011-08-09 LAB — COMPREHENSIVE METABOLIC PANEL
ALT: 12 U/L (ref 0–53)
AST: 16 U/L (ref 0–37)
Albumin: 3.9 g/dL (ref 3.5–5.2)
Alkaline Phosphatase: 100 U/L (ref 39–117)
Calcium: 10.7 mg/dL — ABNORMAL HIGH (ref 8.4–10.5)
GFR calc Af Amer: 15 mL/min — ABNORMAL LOW (ref >60)
Glucose, Bld: 91 mg/dL (ref 70–99)
Potassium: 4.8 mEq/L (ref 3.5–5.1)
Sodium: 136 mEq/L (ref 135–145)
Total Protein: 6.9 g/dL (ref 6.0–8.3)

## 2011-08-09 LAB — DIFFERENTIAL
Basophils Relative: 0 % (ref 0–1)
Eosinophils Absolute: 0.2 10*3/uL (ref 0.0–0.7)
Lymphs Abs: 1.5 10*3/uL (ref 0.7–4.0)
Monocytes Absolute: 0.4 10*3/uL (ref 0.1–1.0)
Monocytes Relative: 8 % (ref 3–12)
Neutrophils Relative %: 57 % (ref 43–77)

## 2011-08-09 MED ORDER — MORPHINE SULFATE 4 MG/ML IJ SOLN
4.0000 mg | Freq: Once | INTRAMUSCULAR | Status: AC
  Administered 2011-08-09: 4 mg via INTRAVENOUS
  Filled 2011-08-09: qty 1

## 2011-08-09 NOTE — ED Notes (Signed)
Pt states he took Lorcet 10/650 at 1430 with no relief.

## 2011-08-09 NOTE — Discharge Summary (Signed)
NAME:  [name], [name] NO.:  [id number]  MEDICAL RECORD NO.:  [id number]  LOCATION:  IC05                          FACILITY:  [facility]  PHYSICIAN:  Melvyn Novas, MDDATE OF BIRTH:  Apr 20, 1976  DATE OF ADMISSION:  07/24/2011 DATE OF DISCHARGE:  08/07/2012LH                              DISCHARGE SUMMARY   ADDENDUM  Essentially, the patient was discharged on all the medicines which were handed to the patient on his after visit post discharge medicine summary sheet.     Melvyn Novas, MD     RMD/MEDQ  D:  08/08/2011  T:  08/09/2011  Job:  [id number]

## 2011-08-09 NOTE — Discharge Summary (Signed)
NAME:  [name], [name] NO.:  [id number]  MEDICAL RECORD NO.:  [id number]  LOCATION:  A335                          FACILITY:  [facility]  PHYSICIAN:  Melvyn Novas, MDDATE OF BIRTH:  24-Aug-1976  DATE OF ADMISSION:  08/05/2011 DATE OF DISCHARGE:  08/20/2012LH                              DISCHARGE SUMMARY   The patient is 35 year old white male, admitted with atypical chest pain with paresthesia radiating down, right arm.  He also had volume overload, congestive heart failure, ischemic cardiomyopathy, two-vessel cardiac disease, end-stage renal disease, chronic obstructive pulmonary disease, hyperlipidemia, schizophrenia, bipolarity, and chronic noncompliance.  The patient was admitted.  Initial troponins were greater than 0.30.  He was placed in ICU on full anticoagulation, was given urgent dialysis, this seemed to help control some of his accelerated hypertension, initial blood pressures were 210 systolic over 104.  His blood pressure drifted down with 2 successive days of dialysis, subsequent cardiac enzymes were negative.  His volume overload was rectified and his blood pressure drifted down to systolic 140/72. He had no significant anginal chest pain, orthopnea, PND, and 2-1/2-3 days prior to discharge.  Cardiology consultation was obtained.  They reviewed the computed charts, stated they did not feel he needed clinical consult at this time that he was safe to discharge based on one initial positive troponin.  I spoke with Dr. Daleen Squibb.  He was hemodynamically stable on discharge, was subsequently discharged on the following medications: 1. Coreg 12.5 mg p.o. b.i.d. 2. Albuterol HFA at a 2 puffs q.i.d. 3. Norvasc 10 mg p.o. daily. 4. Aspirin 81 mg p.o. daily. 5. B complex. 6. Nephro-Vite 0.8 mg daily. 7. Catapres 0.2 mg p.o. t.i.d. 8. Plavix 75 mg p.o. daily. 9. Dexilant 60 mg p.o. daily. 10.Cymbalta 60 mg p.o. daily. 11.Hydralazine 50 mg p.o. q.8  hours. 12.Hydroxyzine 25 mg p.o. t.i.d. p.r.n. 13.Labetalol 200 mg 2 tablets p.o. b.i.d. 14.Lisinopril 20 mg p.o. daily. 15.Zyprexa 10 mg p.o. at bedtime. 16.ReQuip 1 mg p.o. at bedtime. 17.Crestor 20 mg p.o. daily. 18.Renvela 800 mg a.c. t.i.d. 19.Ambien 10 mg p.o. at bedtime p.r.n.  The patient understands these discharge instructions, instructed to return to ER for any sort of anginal chest pain which he understands, occurs dyspnea or dizziness or lightheadedness.  His hemoglobin is 9.2 on discharge and electrolytes within normal limits except for chronically elevated creatinine in the 5 range, he will follow up for dialysis in the a.m.     Melvyn Novas, MD     RMD/MEDQ  D:  08/08/2011  T:  08/09/2011  Job:  [id number]

## 2011-08-09 NOTE — ED Notes (Signed)
Pt reports RLQ pain that radiates to his back since last night.  Pt reports nausea but no vomiting.
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
## 2011-08-09 NOTE — ED Notes (Signed)
Pt states abdominal pain is getting worse since he left the ed a few hours ago.

## 2011-08-09 NOTE — Progress Notes (Signed)
NAMELY, BACCHI NO.:  [id number]  MEDICAL RECORD NO.:  [id number]  LOCATION:  A335                          FACILITY:  [facility]  PHYSICIAN:  Melvyn Novas, MDDATE OF BIRTH:  11/10/1976  DATE OF PROCEDURE: DATE OF DISCHARGE:  08/08/2011                                PROGRESS NOTE   The patient denies any further complaints of anginal chest pain, anginal equivalents, dyspnea, orthopnea, blood pressure, much improved today 148/72.  He is afebrile.  No further cardiac enzymes, and the last 2 being negative.  Sodium is 134, chloride is 92.  Hemoglobin was steady at 9.2, and creatinine of 6.96.  His clonidine was increased yesterday 0.2 mg p.o. t.i.d. along with also the antihypertensive medicines.  Neck shows no JVD.  No carotid bruits.  No thyromegaly.  No thyroid bruits. Lungs show prolonged expiratory phase, scattered rhonchi and diminished breath sounds at the bases.  No rales audible.  Heart regular rhythm. No S3, S4 gallops, heaves, thrills, or rubs.  IMPRESSION:  Atypical chest pain known two-vessel coronary disease status post stenting x3, accelerated hypertension, chronic noncompliance, end-stage renal disease on dialysis following chronic volume overload, acute-on-chronic hyperlipidemia, and chest pain radiating down the right arm upon admission with initial positive troponin two subsequent negative troponin.  Plan is to continue all  current medicines, continue Lovenox.  He is to wait for Cardiology consultation, and will make further recommendations as the database expands.     Melvyn Novas, MD     RMD/MEDQ  D:  08/08/2011  T:  08/09/2011  Job:  [id number]

## 2011-08-10 ENCOUNTER — Emergency Department (HOSPITAL_COMMUNITY)
Admission: EM | Admit: 2011-08-10 | Discharge: 2011-08-10 | Disposition: A | Discharge status: Home / Self Care | Payer: Medicare Other | Attending: Emergency Medicine | Admitting: Emergency Medicine

## 2011-08-10 DIAGNOSIS — R1011 Right upper quadrant pain: Secondary | ICD-10-CM

## 2011-08-10 MED ORDER — HYDROMORPHONE HCL 1 MG/ML IJ SOLN
1.0000 mg | Freq: Once | INTRAMUSCULAR | Status: AC
  Administered 2011-08-10: 1 mg via INTRAMUSCULAR
  Filled 2011-08-10: qty 1

## 2011-08-10 MED ORDER — ONDANSETRON HCL 4 MG PO TABS
4.0000 mg | ORAL_TABLET | Freq: Once | ORAL | Status: AC
  Administered 2011-08-10: 4 mg via ORAL
  Filled 2011-08-10: qty 1
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
## 2011-08-11 ENCOUNTER — Encounter (HOSPITAL_COMMUNITY): Payer: Self-pay | Admitting: Emergency Medicine

## 2011-08-11 ENCOUNTER — Emergency Department (HOSPITAL_COMMUNITY)
Admission: EM | Admit: 2011-08-11 | Discharge: 2011-08-11 | Disposition: A | Discharge status: Home / Self Care | Payer: Medicare Other | Attending: Emergency Medicine | Admitting: Emergency Medicine

## 2011-08-11 DIAGNOSIS — F17200 Nicotine dependence, unspecified, uncomplicated: Secondary | ICD-10-CM | POA: Insufficient documentation

## 2011-08-11 DIAGNOSIS — G894 Chronic pain syndrome: Secondary | ICD-10-CM | POA: Insufficient documentation

## 2011-08-11 DIAGNOSIS — I10 Essential (primary) hypertension: Secondary | ICD-10-CM | POA: Insufficient documentation

## 2011-08-11 DIAGNOSIS — I509 Heart failure, unspecified: Secondary | ICD-10-CM | POA: Insufficient documentation

## 2011-08-11 DIAGNOSIS — G8929 Other chronic pain: Secondary | ICD-10-CM

## 2011-08-11 DIAGNOSIS — F19939 Other psychoactive substance use, unspecified with withdrawal, unspecified: Secondary | ICD-10-CM | POA: Insufficient documentation

## 2011-08-11 DIAGNOSIS — N289 Disorder of kidney and ureter, unspecified: Secondary | ICD-10-CM | POA: Insufficient documentation

## 2011-08-11 DIAGNOSIS — Z7982 Long term (current) use of aspirin: Secondary | ICD-10-CM | POA: Insufficient documentation

## 2011-08-11 DIAGNOSIS — F319 Bipolar disorder, unspecified: Secondary | ICD-10-CM | POA: Insufficient documentation

## 2011-08-11 DIAGNOSIS — R1011 Right upper quadrant pain: Secondary | ICD-10-CM | POA: Insufficient documentation

## 2011-08-11 DIAGNOSIS — R109 Unspecified abdominal pain: Secondary | ICD-10-CM

## 2011-08-11 DIAGNOSIS — F1120 Opioid dependence, uncomplicated: Secondary | ICD-10-CM | POA: Insufficient documentation

## 2011-08-11 DIAGNOSIS — Z79899 Other long term (current) drug therapy: Secondary | ICD-10-CM | POA: Insufficient documentation

## 2011-08-11 DIAGNOSIS — Z9861 Coronary angioplasty status: Secondary | ICD-10-CM | POA: Insufficient documentation

## 2011-08-11 DIAGNOSIS — F209 Schizophrenia, unspecified: Secondary | ICD-10-CM | POA: Insufficient documentation

## 2011-08-11 LAB — CBC
MCH: 30.3 pg (ref 26.0–34.0)
MCHC: 33.9 g/dL (ref 30.0–36.0)
MCV: 89.4 fL (ref 78.0–100.0)
Platelets: 218 10*3/uL (ref 150–400)
RBC: 3.3 MIL/uL — ABNORMAL LOW (ref 4.22–5.81)

## 2011-08-11 LAB — HEPATIC FUNCTION PANEL
Alkaline Phosphatase: 94 U/L (ref 39–117)
Indirect Bilirubin: 0.2 mg/dL — ABNORMAL LOW (ref 0.3–0.9)
Total Bilirubin: 0.3 mg/dL (ref 0.3–1.2)
Total Protein: 7.3 g/dL (ref 6.0–8.3)

## 2011-08-11 LAB — DIFFERENTIAL
Basophils Relative: 0 % (ref 0–1)
Eosinophils Absolute: 0.2 10*3/uL (ref 0.0–0.7)
Eosinophils Relative: 4 % (ref 0–5)
Lymphs Abs: 1 10*3/uL (ref 0.7–4.0)
Monocytes Relative: 5 % (ref 3–12)

## 2011-08-11 LAB — LIPASE, BLOOD: Lipase: 31 U/L (ref 11–59)

## 2011-08-11 MED ORDER — ONDANSETRON HCL 4 MG PO TABS
8.0000 mg | ORAL_TABLET | Freq: Once | ORAL | Status: AC
  Administered 2011-08-11: 8 mg via ORAL
  Filled 2011-08-11: qty 2

## 2011-08-11 MED ORDER — HYDROMORPHONE HCL 1 MG/ML IJ SOLN
1.0000 mg | Freq: Once | INTRAMUSCULAR | Status: AC
  Administered 2011-08-11: 1 mg via INTRAMUSCULAR
  Filled 2011-08-11: qty 1

## 2011-08-11 NOTE — ED Notes (Signed)
Zofran 8 mg given po and Dilaudid 1 mg given IM in Pt's right hip per his request.  Tolerated well and rated pain as an 8 prior to medications.

## 2011-08-11 NOTE — ED Notes (Signed)
C/o Right mid abdominal pain radiating into right flank area--onset 4 days ago--nausea without vomiting---no discomfort with voiding--lbm was yesterday and normal.  Rates pain 8 on 1-10 scale.-Has dialysis on Tues, Thurs and Saturday and today just came from dialysis.
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
## 2011-08-11 NOTE — ED Notes (Signed)
Pt c/o ruq abd pain x 4 days with nausea. Denies v/d.

## 2011-08-11 NOTE — ED Notes (Signed)
Reports pain and nausea are decreased--requesting something to eat--advised needed to wait until all of assessment and testing are done.

## 2011-08-12 ENCOUNTER — Encounter (HOSPITAL_COMMUNITY): Payer: Self-pay | Admitting: *Deleted

## 2011-08-12 ENCOUNTER — Other Ambulatory Visit: Payer: Self-pay

## 2011-08-12 ENCOUNTER — Emergency Department (HOSPITAL_COMMUNITY): Payer: Medicare Other

## 2011-08-12 ENCOUNTER — Emergency Department (HOSPITAL_COMMUNITY)
Admission: EM | Admit: 2011-08-12 | Discharge: 2011-08-12 | Disposition: A | Discharge status: Home / Self Care | Payer: Medicare Other | Attending: Emergency Medicine | Admitting: Emergency Medicine

## 2011-08-12 DIAGNOSIS — F17200 Nicotine dependence, unspecified, uncomplicated: Secondary | ICD-10-CM | POA: Insufficient documentation

## 2011-08-12 DIAGNOSIS — F319 Bipolar disorder, unspecified: Secondary | ICD-10-CM | POA: Insufficient documentation

## 2011-08-12 DIAGNOSIS — I509 Heart failure, unspecified: Secondary | ICD-10-CM | POA: Insufficient documentation

## 2011-08-12 DIAGNOSIS — I10 Essential (primary) hypertension: Secondary | ICD-10-CM | POA: Insufficient documentation

## 2011-08-12 DIAGNOSIS — R072 Precordial pain: Secondary | ICD-10-CM | POA: Insufficient documentation

## 2011-08-12 DIAGNOSIS — Z9861 Coronary angioplasty status: Secondary | ICD-10-CM | POA: Insufficient documentation

## 2011-08-12 DIAGNOSIS — G8929 Other chronic pain: Secondary | ICD-10-CM

## 2011-08-12 DIAGNOSIS — R1011 Right upper quadrant pain: Secondary | ICD-10-CM | POA: Insufficient documentation

## 2011-08-12 DIAGNOSIS — I440 Atrioventricular block, first degree: Secondary | ICD-10-CM | POA: Insufficient documentation

## 2011-08-12 DIAGNOSIS — F209 Schizophrenia, unspecified: Secondary | ICD-10-CM | POA: Insufficient documentation

## 2011-08-12 MED ORDER — OXYCODONE-ACETAMINOPHEN 5-325 MG PO TABS
2.0000 | ORAL_TABLET | Freq: Once | ORAL | Status: AC
  Administered 2011-08-12: 2 via ORAL
  Filled 2011-08-12: qty 2
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
## 2011-08-12 NOTE — ED Notes (Signed)
MD at bedside. 

## 2011-08-12 NOTE — ED Notes (Signed)
Pt c/o substernal chest pain, RUQ pain and difficulty breathing. Pt states that he woke up like that this am. Pt was discharged from the hospital on Monday. Pt states that he finished his 4 hour dialysis treatment yesterday.

## 2011-08-12 NOTE — ED Notes (Signed)
Pt to CT

## 2011-08-15 ENCOUNTER — Emergency Department (HOSPITAL_COMMUNITY)
Admission: EM | Admit: 2011-08-15 | Discharge: 2011-08-15 | Disposition: A | Discharge status: Home / Self Care | Payer: Medicare Other | Attending: Emergency Medicine | Admitting: Emergency Medicine

## 2011-08-15 ENCOUNTER — Encounter (HOSPITAL_COMMUNITY): Payer: Self-pay | Admitting: Emergency Medicine

## 2011-08-15 DIAGNOSIS — G8929 Other chronic pain: Secondary | ICD-10-CM

## 2011-08-15 DIAGNOSIS — F17200 Nicotine dependence, unspecified, uncomplicated: Secondary | ICD-10-CM | POA: Insufficient documentation

## 2011-08-15 DIAGNOSIS — Z992 Dependence on renal dialysis: Secondary | ICD-10-CM | POA: Insufficient documentation

## 2011-08-15 DIAGNOSIS — F209 Schizophrenia, unspecified: Secondary | ICD-10-CM | POA: Insufficient documentation

## 2011-08-15 DIAGNOSIS — R1011 Right upper quadrant pain: Secondary | ICD-10-CM | POA: Insufficient documentation

## 2011-08-15 DIAGNOSIS — Z9861 Coronary angioplasty status: Secondary | ICD-10-CM | POA: Insufficient documentation

## 2011-08-15 DIAGNOSIS — G894 Chronic pain syndrome: Secondary | ICD-10-CM | POA: Insufficient documentation

## 2011-08-15 DIAGNOSIS — I10 Essential (primary) hypertension: Secondary | ICD-10-CM | POA: Insufficient documentation

## 2011-08-15 DIAGNOSIS — I509 Heart failure, unspecified: Secondary | ICD-10-CM | POA: Insufficient documentation

## 2011-08-15 DIAGNOSIS — R1110 Vomiting, unspecified: Secondary | ICD-10-CM | POA: Insufficient documentation

## 2011-08-15 LAB — DIFFERENTIAL
Basophils Absolute: 0 10*3/uL (ref 0.0–0.1)
Basophils Relative: 0 % (ref 0–1)
Neutro Abs: 3.6 10*3/uL (ref 1.7–7.7)
Neutrophils Relative %: 64 % (ref 43–77)

## 2011-08-15 LAB — COMPREHENSIVE METABOLIC PANEL
ALT: 12 U/L (ref 0–53)
AST: 16 U/L (ref 0–37)
Albumin: 4.1 g/dL (ref 3.5–5.2)
Alkaline Phosphatase: 99 U/L (ref 39–117)
Chloride: 96 mEq/L (ref 96–112)
Creatinine, Ser: 8.36 mg/dL — ABNORMAL HIGH (ref 0.50–1.35)
Potassium: 5.8 mEq/L — ABNORMAL HIGH (ref 3.5–5.1)
Sodium: 135 mEq/L (ref 135–145)
Total Bilirubin: 0.4 mg/dL (ref 0.3–1.2)

## 2011-08-15 LAB — CBC
MCHC: 33.1 g/dL (ref 30.0–36.0)
RDW: 17 % — ABNORMAL HIGH (ref 11.5–15.5)
WBC: 5.7 10*3/uL (ref 4.0–10.5)

## 2011-08-15 MED ORDER — ACETAMINOPHEN 325 MG PO TABS
ORAL_TABLET | ORAL | Status: AC
  Administered 2011-08-15: 650 mg
  Filled 2011-08-15: qty 2
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
## 2011-08-15 NOTE — ED Provider Notes (Signed)
Medical screening examination/treatment/procedure(s) were performed by non-physician practitioner and as supervising physician I was immediately available for consultation/collaboration.  Kelsha Older L Steffani Dionisio, MD 08/15/11 1021 

## 2011-08-15 NOTE — ED Notes (Signed)
Patient states he is having pain in his stomach at a scale of 1 to 10 he is at a 9. Patient was told that the nurse and the MD would be notified. Patient takes dialysis and can not give an UA at this time.

## 2011-08-15 NOTE — ED Notes (Signed)
Pt c/o abd pain with nausea and vomiting since Friday.

## 2011-08-15 NOTE — ED Notes (Signed)
Pt reports abdominal pain and requesting pain medication.  Pt ambulating in hall and walked out to nurses desk twice.  No distress noted.

## 2011-08-16 ENCOUNTER — Emergency Department (HOSPITAL_COMMUNITY)
Admission: EM | Admit: 2011-08-16 | Discharge: 2011-08-16 | Disposition: A | Discharge status: Home / Self Care | Payer: Medicare Other | Attending: Emergency Medicine | Admitting: Emergency Medicine

## 2011-08-16 ENCOUNTER — Encounter (HOSPITAL_COMMUNITY): Payer: Self-pay

## 2011-08-16 DIAGNOSIS — D649 Anemia, unspecified: Secondary | ICD-10-CM | POA: Insufficient documentation

## 2011-08-16 DIAGNOSIS — F319 Bipolar disorder, unspecified: Secondary | ICD-10-CM | POA: Insufficient documentation

## 2011-08-16 DIAGNOSIS — N186 End stage renal disease: Secondary | ICD-10-CM | POA: Insufficient documentation

## 2011-08-16 DIAGNOSIS — I120 Hypertensive chronic kidney disease with stage 5 chronic kidney disease or end stage renal disease: Secondary | ICD-10-CM | POA: Insufficient documentation

## 2011-08-16 DIAGNOSIS — R109 Unspecified abdominal pain: Secondary | ICD-10-CM | POA: Insufficient documentation

## 2011-08-16 DIAGNOSIS — F209 Schizophrenia, unspecified: Secondary | ICD-10-CM | POA: Insufficient documentation

## 2011-08-16 DIAGNOSIS — G8929 Other chronic pain: Secondary | ICD-10-CM

## 2011-08-16 DIAGNOSIS — F17200 Nicotine dependence, unspecified, uncomplicated: Secondary | ICD-10-CM | POA: Insufficient documentation

## 2011-08-16 DIAGNOSIS — I509 Heart failure, unspecified: Secondary | ICD-10-CM | POA: Insufficient documentation

## 2011-08-16 DIAGNOSIS — Z9861 Coronary angioplasty status: Secondary | ICD-10-CM | POA: Insufficient documentation

## 2011-08-16 LAB — POCT I-STAT, CHEM 8
BUN: 12 mg/dL (ref 6–23)
Creatinine, Ser: 3.9 mg/dL — ABNORMAL HIGH (ref 0.50–1.35)
Sodium: 139 mEq/L (ref 135–145)
TCO2: 25 mmol/L (ref 0–100)

## 2011-08-16 MED ORDER — HYDROMORPHONE HCL 1 MG/ML IJ SOLN
1.0000 mg | Freq: Once | INTRAMUSCULAR | Status: AC
  Administered 2011-08-16: 1 mg via INTRAMUSCULAR
  Filled 2011-08-16: qty 1

## 2011-08-16 NOTE — ED Notes (Signed)
Patient c/o flank pain since yesterday.

## 2011-08-16 NOTE — ED Notes (Signed)
Complain of stomach and back pain
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
## 2011-08-17 ENCOUNTER — Ambulatory Visit (INDEPENDENT_AMBULATORY_CARE_PROVIDER_SITE_OTHER): Payer: Medicare Other | Admitting: Physician Assistant

## 2011-08-17 ENCOUNTER — Encounter: Payer: Self-pay | Admitting: Physician Assistant

## 2011-08-17 ENCOUNTER — Encounter (HOSPITAL_COMMUNITY): Payer: Self-pay

## 2011-08-17 ENCOUNTER — Emergency Department (HOSPITAL_COMMUNITY)
Admission: EM | Admit: 2011-08-17 | Discharge: 2011-08-17 | Disposition: A | Discharge status: Home / Self Care | Payer: Medicare Other | Attending: Emergency Medicine | Admitting: Emergency Medicine

## 2011-08-17 DIAGNOSIS — Z7982 Long term (current) use of aspirin: Secondary | ICD-10-CM | POA: Insufficient documentation

## 2011-08-17 DIAGNOSIS — I509 Heart failure, unspecified: Secondary | ICD-10-CM

## 2011-08-17 DIAGNOSIS — M545 Low back pain, unspecified: Secondary | ICD-10-CM | POA: Insufficient documentation

## 2011-08-17 DIAGNOSIS — G8929 Other chronic pain: Secondary | ICD-10-CM | POA: Insufficient documentation

## 2011-08-17 DIAGNOSIS — F17200 Nicotine dependence, unspecified, uncomplicated: Secondary | ICD-10-CM

## 2011-08-17 DIAGNOSIS — I2510 Atherosclerotic heart disease of native coronary artery without angina pectoris: Secondary | ICD-10-CM

## 2011-08-17 DIAGNOSIS — Z79899 Other long term (current) drug therapy: Secondary | ICD-10-CM | POA: Insufficient documentation

## 2011-08-17 DIAGNOSIS — R079 Chest pain, unspecified: Secondary | ICD-10-CM

## 2011-08-17 DIAGNOSIS — N186 End stage renal disease: Secondary | ICD-10-CM

## 2011-08-17 DIAGNOSIS — R109 Unspecified abdominal pain: Secondary | ICD-10-CM | POA: Insufficient documentation

## 2011-08-17 DIAGNOSIS — M549 Dorsalgia, unspecified: Secondary | ICD-10-CM | POA: Insufficient documentation

## 2011-08-17 DIAGNOSIS — I10 Essential (primary) hypertension: Secondary | ICD-10-CM | POA: Insufficient documentation

## 2011-08-17 DIAGNOSIS — F319 Bipolar disorder, unspecified: Secondary | ICD-10-CM | POA: Insufficient documentation

## 2011-08-17 DIAGNOSIS — N289 Disorder of kidney and ureter, unspecified: Secondary | ICD-10-CM | POA: Insufficient documentation

## 2011-08-17 DIAGNOSIS — Z720 Tobacco use: Secondary | ICD-10-CM | POA: Insufficient documentation

## 2011-08-17 DIAGNOSIS — Z888 Allergy status to other drugs, medicaments and biological substances status: Secondary | ICD-10-CM | POA: Insufficient documentation

## 2011-08-17 MED ORDER — HYDROMORPHONE HCL 2 MG/ML IJ SOLN
2.0000 mg | Freq: Once | INTRAMUSCULAR | Status: AC
  Administered 2011-08-17: 2 mg via INTRAMUSCULAR
  Filled 2011-08-17: qty 1

## 2011-08-17 NOTE — Assessment & Plan Note (Addendum)
Patient complains of atypical chest pain described as a constant soreness in his chest that never goes away. He had cardiac workups that have been negative. He does have coronary artery disease but says his pain is very different from his prior MI pain. No further workup at this time.

## 2011-08-17 NOTE — Assessment & Plan Note (Signed)
On hemodialysis 3 days weekly

## 2011-08-17 NOTE — Assessment & Plan Note (Signed)
Patient continues to smoke 7 cigarettes daily. He has cut down from 3 packs per day. We discussed the importance of smoking cessation.

## 2011-08-17 NOTE — ED Notes (Signed)
Pt c/o abd pain and back pain. Saw Cardiologist today at 11am. Pt states he was going to see Dr. Janna Arch but office was closed. Pt recently seen here for same. Pt states "this is the last time i might be here for awhile because Dr. Janna Arch is going to put me on medication for pain"
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
## 2011-08-17 NOTE — Patient Instructions (Signed)
**Note De-identified  Obfuscation** Your physician recommends that you continue on your current medications as directed. Please refer to the Current Medication list given to you today.  Your physician recommends that you schedule a follow-up appointment in: 3 months  

## 2011-08-17 NOTE — Assessment & Plan Note (Signed)
Heart failure stable
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
## 2011-08-24 ENCOUNTER — Emergency Department (HOSPITAL_COMMUNITY)
Admission: EM | Admit: 2011-08-24 | Discharge: 2011-08-24 | Disposition: A | Discharge status: Home / Self Care | Payer: Medicare Other | Attending: Emergency Medicine | Admitting: Emergency Medicine

## 2011-08-24 ENCOUNTER — Encounter (HOSPITAL_COMMUNITY): Payer: Self-pay | Admitting: *Deleted

## 2011-08-24 DIAGNOSIS — I10 Essential (primary) hypertension: Secondary | ICD-10-CM | POA: Insufficient documentation

## 2011-08-24 DIAGNOSIS — L02419 Cutaneous abscess of limb, unspecified: Secondary | ICD-10-CM | POA: Insufficient documentation

## 2011-08-24 DIAGNOSIS — F319 Bipolar disorder, unspecified: Secondary | ICD-10-CM | POA: Insufficient documentation

## 2011-08-24 DIAGNOSIS — G894 Chronic pain syndrome: Secondary | ICD-10-CM | POA: Insufficient documentation

## 2011-08-24 DIAGNOSIS — F209 Schizophrenia, unspecified: Secondary | ICD-10-CM | POA: Insufficient documentation

## 2011-08-24 DIAGNOSIS — L0390 Cellulitis, unspecified: Secondary | ICD-10-CM

## 2011-08-24 DIAGNOSIS — N289 Disorder of kidney and ureter, unspecified: Secondary | ICD-10-CM | POA: Insufficient documentation

## 2011-08-24 DIAGNOSIS — Z79899 Other long term (current) drug therapy: Secondary | ICD-10-CM | POA: Insufficient documentation

## 2011-08-24 DIAGNOSIS — L03119 Cellulitis of unspecified part of limb: Secondary | ICD-10-CM | POA: Insufficient documentation

## 2011-08-24 DIAGNOSIS — I509 Heart failure, unspecified: Secondary | ICD-10-CM | POA: Insufficient documentation

## 2011-08-24 DIAGNOSIS — F17200 Nicotine dependence, unspecified, uncomplicated: Secondary | ICD-10-CM | POA: Insufficient documentation

## 2011-08-24 MED ORDER — CEPHALEXIN 500 MG PO CAPS: ORAL_CAPSULE | ORAL | Status: AC

## 2011-08-24 MED ORDER — PREDNISONE 20 MG PO TABS: ORAL_TABLET | ORAL | Status: AC

## 2011-08-24 MED ORDER — HYDROCODONE-ACETAMINOPHEN 5-325 MG PO TABS: 2.0000 | ORAL_TABLET | ORAL | Status: AC | PRN

## 2011-08-24 MED ORDER — LORATADINE 10 MG PO TABS: 10.0000 mg | ORAL_TABLET | Freq: Every day | ORAL | Status: DC

## 2011-08-24 MED ORDER — DIPHENHYDRAMINE HCL 25 MG PO TABS
50.0000 mg | ORAL_TABLET | ORAL | Status: DC | PRN
Start: 2011-08-24 — End: 2011-09-21

## 2011-08-24 MED ORDER — FAMOTIDINE 20 MG PO TABS: 20.0000 mg | ORAL_TABLET | Freq: Two times a day (BID) | ORAL | Status: DC

## 2011-08-24 NOTE — ED Notes (Signed)
Pt c/o pain and redness to right lower leg. Pt states he was bitten by a spider this am.
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
## 2011-08-27 ENCOUNTER — Emergency Department (HOSPITAL_COMMUNITY)
Admission: EM | Admit: 2011-08-27 | Discharge: 2011-08-27 | Disposition: A | Discharge status: Home / Self Care | Payer: Medicare Other | Attending: Emergency Medicine | Admitting: Emergency Medicine

## 2011-08-27 ENCOUNTER — Encounter (HOSPITAL_COMMUNITY): Payer: Self-pay

## 2011-08-27 DIAGNOSIS — Z9861 Coronary angioplasty status: Secondary | ICD-10-CM | POA: Insufficient documentation

## 2011-08-27 DIAGNOSIS — I509 Heart failure, unspecified: Secondary | ICD-10-CM | POA: Insufficient documentation

## 2011-08-27 DIAGNOSIS — B999 Unspecified infectious disease: Secondary | ICD-10-CM

## 2011-08-27 DIAGNOSIS — I10 Essential (primary) hypertension: Secondary | ICD-10-CM | POA: Insufficient documentation

## 2011-08-27 DIAGNOSIS — F209 Schizophrenia, unspecified: Secondary | ICD-10-CM | POA: Insufficient documentation

## 2011-08-27 DIAGNOSIS — F17200 Nicotine dependence, unspecified, uncomplicated: Secondary | ICD-10-CM | POA: Insufficient documentation

## 2011-08-27 DIAGNOSIS — G894 Chronic pain syndrome: Secondary | ICD-10-CM | POA: Insufficient documentation

## 2011-08-27 DIAGNOSIS — F319 Bipolar disorder, unspecified: Secondary | ICD-10-CM | POA: Insufficient documentation

## 2011-08-27 NOTE — ED Notes (Signed)
Pt reports being bitten by a spider on his rt leg.  Pt reports killing the spider.  Pt states that "it feels like a knife stabbing my leg".  Area is slightly reddened.  No swelling to area.  nad noted
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
## 2011-08-29 ENCOUNTER — Encounter: Payer: Self-pay | Admitting: Physician Assistant

## 2011-08-29 ENCOUNTER — Emergency Department (HOSPITAL_COMMUNITY)
Admission: EM | Admit: 2011-08-29 | Discharge: 2011-08-29 | Disposition: A | Discharge status: Home / Self Care | Payer: Medicare Other | Attending: Emergency Medicine | Admitting: Emergency Medicine

## 2011-08-29 ENCOUNTER — Encounter (HOSPITAL_COMMUNITY): Payer: Self-pay | Admitting: *Deleted

## 2011-08-29 DIAGNOSIS — N189 Chronic kidney disease, unspecified: Secondary | ICD-10-CM | POA: Insufficient documentation

## 2011-08-29 DIAGNOSIS — F319 Bipolar disorder, unspecified: Secondary | ICD-10-CM | POA: Insufficient documentation

## 2011-08-29 DIAGNOSIS — G8929 Other chronic pain: Secondary | ICD-10-CM

## 2011-08-29 DIAGNOSIS — I509 Heart failure, unspecified: Secondary | ICD-10-CM | POA: Insufficient documentation

## 2011-08-29 DIAGNOSIS — F209 Schizophrenia, unspecified: Secondary | ICD-10-CM | POA: Insufficient documentation

## 2011-08-29 DIAGNOSIS — Z9861 Coronary angioplasty status: Secondary | ICD-10-CM | POA: Insufficient documentation

## 2011-08-29 DIAGNOSIS — M549 Dorsalgia, unspecified: Secondary | ICD-10-CM | POA: Insufficient documentation

## 2011-08-29 DIAGNOSIS — Z992 Dependence on renal dialysis: Secondary | ICD-10-CM | POA: Insufficient documentation

## 2011-08-29 DIAGNOSIS — F17200 Nicotine dependence, unspecified, uncomplicated: Secondary | ICD-10-CM | POA: Insufficient documentation

## 2011-08-29 DIAGNOSIS — I129 Hypertensive chronic kidney disease with stage 1 through stage 4 chronic kidney disease, or unspecified chronic kidney disease: Secondary | ICD-10-CM | POA: Insufficient documentation

## 2011-08-29 LAB — POCT I-STAT, CHEM 8
BUN: 49 mg/dL — ABNORMAL HIGH (ref 6–23)
Calcium, Ion: 1.35 mmol/L — ABNORMAL HIGH (ref 1.12–1.32)
HCT: 37 % — ABNORMAL LOW (ref 39.0–52.0)
Hemoglobin: 12.6 g/dL — ABNORMAL LOW (ref 13.0–17.0)
Sodium: 133 mEq/L — ABNORMAL LOW (ref 135–145)
TCO2: 24 mmol/L (ref 0–100)

## 2011-08-29 MED ORDER — HYDROMORPHONE HCL 1 MG/ML IJ SOLN
1.0000 mg | Freq: Once | INTRAMUSCULAR | Status: AC
  Administered 2011-08-29: 1 mg via INTRAMUSCULAR
  Filled 2011-08-29: qty 1

## 2011-08-29 NOTE — ED Notes (Signed)
C/o lower back pain and kidney pain that started yesterday.  Denies n/v/d.
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
## 2011-08-30 ENCOUNTER — Encounter (HOSPITAL_COMMUNITY): Payer: Self-pay | Admitting: Emergency Medicine

## 2011-08-30 ENCOUNTER — Emergency Department (HOSPITAL_COMMUNITY)
Admission: EM | Admit: 2011-08-30 | Discharge: 2011-08-30 | Disposition: A | Discharge status: Home / Self Care | Payer: Medicare Other | Attending: Emergency Medicine | Admitting: Emergency Medicine

## 2011-08-30 DIAGNOSIS — N189 Chronic kidney disease, unspecified: Secondary | ICD-10-CM | POA: Insufficient documentation

## 2011-08-30 DIAGNOSIS — Z79899 Other long term (current) drug therapy: Secondary | ICD-10-CM | POA: Insufficient documentation

## 2011-08-30 DIAGNOSIS — G8929 Other chronic pain: Secondary | ICD-10-CM | POA: Insufficient documentation

## 2011-08-30 DIAGNOSIS — M549 Dorsalgia, unspecified: Secondary | ICD-10-CM | POA: Insufficient documentation

## 2011-08-30 DIAGNOSIS — R109 Unspecified abdominal pain: Secondary | ICD-10-CM | POA: Insufficient documentation

## 2011-08-30 DIAGNOSIS — I10 Essential (primary) hypertension: Secondary | ICD-10-CM | POA: Insufficient documentation

## 2011-08-30 DIAGNOSIS — Z992 Dependence on renal dialysis: Secondary | ICD-10-CM | POA: Insufficient documentation
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
## 2011-08-30 NOTE — ED Notes (Signed)
Pt a/ox4. Resp even and unlabored. NAD at this time. D/C instructions reviewed with pt. Pt verbalized understanding. Pt ambulated with steady gate to POV. 

## 2011-08-30 NOTE — ED Notes (Signed)
Pt here for flank/back pain x 3 days. Pt seen in ed yesterday for same.

## 2011-08-31 NOTE — ED Provider Notes (Signed)
History/physical exam/procedure(s) were performed by non-physician practitioner and as supervising physician I was immediately available for consultation/collaboration. I have reviewed all notes and am in agreement with care and plan.   Hilario Quarry, MD 08/31/11 2000

## 2011-09-01 ENCOUNTER — Emergency Department (HOSPITAL_COMMUNITY)
Admission: EM | Admit: 2011-09-01 | Discharge: 2011-09-01 | Disposition: A | Discharge status: Home / Self Care | Payer: Medicare Other | Attending: Emergency Medicine | Admitting: Emergency Medicine

## 2011-09-01 ENCOUNTER — Emergency Department (HOSPITAL_COMMUNITY): Payer: Medicare Other

## 2011-09-01 ENCOUNTER — Encounter (HOSPITAL_COMMUNITY): Payer: Self-pay | Admitting: *Deleted

## 2011-09-01 DIAGNOSIS — I120 Hypertensive chronic kidney disease with stage 5 chronic kidney disease or end stage renal disease: Secondary | ICD-10-CM | POA: Insufficient documentation

## 2011-09-01 DIAGNOSIS — F209 Schizophrenia, unspecified: Secondary | ICD-10-CM | POA: Insufficient documentation

## 2011-09-01 DIAGNOSIS — Z7982 Long term (current) use of aspirin: Secondary | ICD-10-CM | POA: Insufficient documentation

## 2011-09-01 DIAGNOSIS — F17200 Nicotine dependence, unspecified, uncomplicated: Secondary | ICD-10-CM | POA: Insufficient documentation

## 2011-09-01 DIAGNOSIS — I509 Heart failure, unspecified: Secondary | ICD-10-CM | POA: Insufficient documentation

## 2011-09-01 DIAGNOSIS — G894 Chronic pain syndrome: Secondary | ICD-10-CM | POA: Insufficient documentation

## 2011-09-01 DIAGNOSIS — G8929 Other chronic pain: Secondary | ICD-10-CM

## 2011-09-01 DIAGNOSIS — J189 Pneumonia, unspecified organism: Secondary | ICD-10-CM

## 2011-09-01 DIAGNOSIS — Z79899 Other long term (current) drug therapy: Secondary | ICD-10-CM | POA: Insufficient documentation

## 2011-09-01 DIAGNOSIS — N186 End stage renal disease: Secondary | ICD-10-CM | POA: Insufficient documentation

## 2011-09-01 DIAGNOSIS — Z992 Dependence on renal dialysis: Secondary | ICD-10-CM | POA: Insufficient documentation

## 2011-09-01 DIAGNOSIS — I519 Heart disease, unspecified: Secondary | ICD-10-CM | POA: Insufficient documentation

## 2011-09-01 LAB — POCT I-STAT, CHEM 8
HCT: 33 % — ABNORMAL LOW (ref 39.0–52.0)
Hemoglobin: 11.2 g/dL — ABNORMAL LOW (ref 13.0–17.0)
Potassium: 4.4 mEq/L (ref 3.5–5.1)
Sodium: 138 mEq/L (ref 135–145)
TCO2: 22 mmol/L (ref 0–100)

## 2011-09-01 MED ORDER — ONDANSETRON 4 MG PO TBDP
4.0000 mg | ORAL_TABLET | Freq: Once | ORAL | Status: AC
  Administered 2011-09-01: 4 mg via ORAL
  Filled 2011-09-01: qty 1

## 2011-09-01 MED ORDER — DOXYCYCLINE HYCLATE 50 MG PO CAPS
100.0000 mg | ORAL_CAPSULE | Freq: Once | ORAL | Status: DC
  Filled 2011-09-01: qty 2

## 2011-09-01 MED ORDER — DOXYCYCLINE HYCLATE 100 MG PO CAPS: 100.0000 mg | ORAL_CAPSULE | Freq: Two times a day (BID) | ORAL | Status: AC

## 2011-09-01 MED ORDER — HYDROMORPHONE HCL 2 MG/ML IJ SOLN
2.0000 mg | Freq: Once | INTRAMUSCULAR | Status: AC
  Administered 2011-09-01: 2 mg via INTRAMUSCULAR
  Filled 2011-09-01: qty 1

## 2011-09-01 MED ORDER — DOXYCYCLINE HYCLATE 100 MG PO TABS
ORAL_TABLET | ORAL | Status: AC
  Administered 2011-09-01: 100 mg
  Filled 2011-09-01: qty 1

## 2011-09-01 MED ORDER — HYDROCODONE-ACETAMINOPHEN 5-325 MG PO TABS: 1.0000 | ORAL_TABLET | ORAL | Status: AC | PRN

## 2011-09-01 NOTE — Progress Notes (Signed)
  Medical screening examination/treatment/procedure(s) were performed by non-physician practitioner and as supervising physician I was immediately available for consultation/collaboration.     

## 2011-09-01 NOTE — ED Notes (Signed)
Patient in for c/o SOB, patient a hemodialysis patient and is due for treatment today

## 2011-09-01 NOTE — ED Notes (Signed)
Patient was dispensed Norco outpt pack prior to discharge, dispensed by Dr. Read Drivers per verbal order
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
## 2011-09-01 NOTE — ED Notes (Signed)
Reports he woke up with SOB and "hurting real bad".

## 2011-09-03 ENCOUNTER — Emergency Department (HOSPITAL_COMMUNITY): Payer: Medicare Other

## 2011-09-03 ENCOUNTER — Encounter (HOSPITAL_COMMUNITY): Payer: Self-pay | Admitting: *Deleted

## 2011-09-03 ENCOUNTER — Emergency Department (HOSPITAL_COMMUNITY)
Admission: EM | Admit: 2011-09-03 | Discharge: 2011-09-03 | Disposition: A | Discharge status: Home / Self Care | Payer: Medicare Other | Attending: Emergency Medicine | Admitting: Emergency Medicine

## 2011-09-03 DIAGNOSIS — I120 Hypertensive chronic kidney disease with stage 5 chronic kidney disease or end stage renal disease: Secondary | ICD-10-CM | POA: Insufficient documentation

## 2011-09-03 DIAGNOSIS — M549 Dorsalgia, unspecified: Secondary | ICD-10-CM | POA: Insufficient documentation

## 2011-09-03 DIAGNOSIS — F209 Schizophrenia, unspecified: Secondary | ICD-10-CM | POA: Insufficient documentation

## 2011-09-03 DIAGNOSIS — F17200 Nicotine dependence, unspecified, uncomplicated: Secondary | ICD-10-CM | POA: Insufficient documentation

## 2011-09-03 DIAGNOSIS — IMO0001 Reserved for inherently not codable concepts without codable children: Secondary | ICD-10-CM | POA: Insufficient documentation

## 2011-09-03 DIAGNOSIS — G894 Chronic pain syndrome: Secondary | ICD-10-CM | POA: Insufficient documentation

## 2011-09-03 DIAGNOSIS — I509 Heart failure, unspecified: Secondary | ICD-10-CM | POA: Insufficient documentation

## 2011-09-03 DIAGNOSIS — F319 Bipolar disorder, unspecified: Secondary | ICD-10-CM | POA: Insufficient documentation

## 2011-09-03 DIAGNOSIS — N186 End stage renal disease: Secondary | ICD-10-CM

## 2011-09-03 DIAGNOSIS — Z79899 Other long term (current) drug therapy: Secondary | ICD-10-CM | POA: Insufficient documentation

## 2011-09-03 DIAGNOSIS — R0602 Shortness of breath: Secondary | ICD-10-CM

## 2011-09-03 DIAGNOSIS — Z992 Dependence on renal dialysis: Secondary | ICD-10-CM | POA: Insufficient documentation

## 2011-09-03 DIAGNOSIS — G8929 Other chronic pain: Secondary | ICD-10-CM

## 2011-09-03 LAB — POCT I-STAT, CHEM 8
Chloride: 106 mEq/L (ref 96–112)
HCT: 30 % — ABNORMAL LOW (ref 39.0–52.0)
Hemoglobin: 10.2 g/dL — ABNORMAL LOW (ref 13.0–17.0)
Potassium: 4.3 mEq/L (ref 3.5–5.1)
Sodium: 138 mEq/L (ref 135–145)

## 2011-09-03 IMAGING — CR DG LUMBAR SPINE COMPLETE 4+V
5 series · 5 of 5 positions shown · non-contrast
Comparison: 10/13/2008 CT

CLINICAL DATA: Low back pain

LUMBAR SPINE - COMPLETE 4+ VIEW

[view not recorded (1 of 5)]
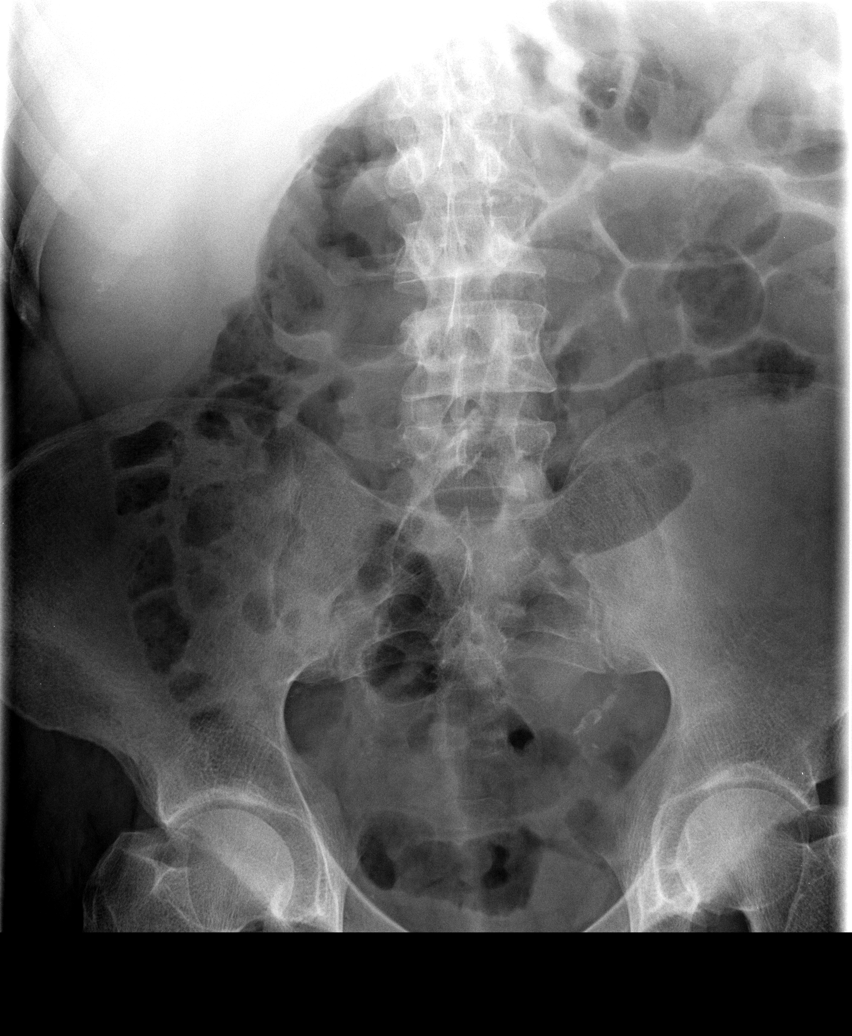

[view not recorded (2 of 5)]
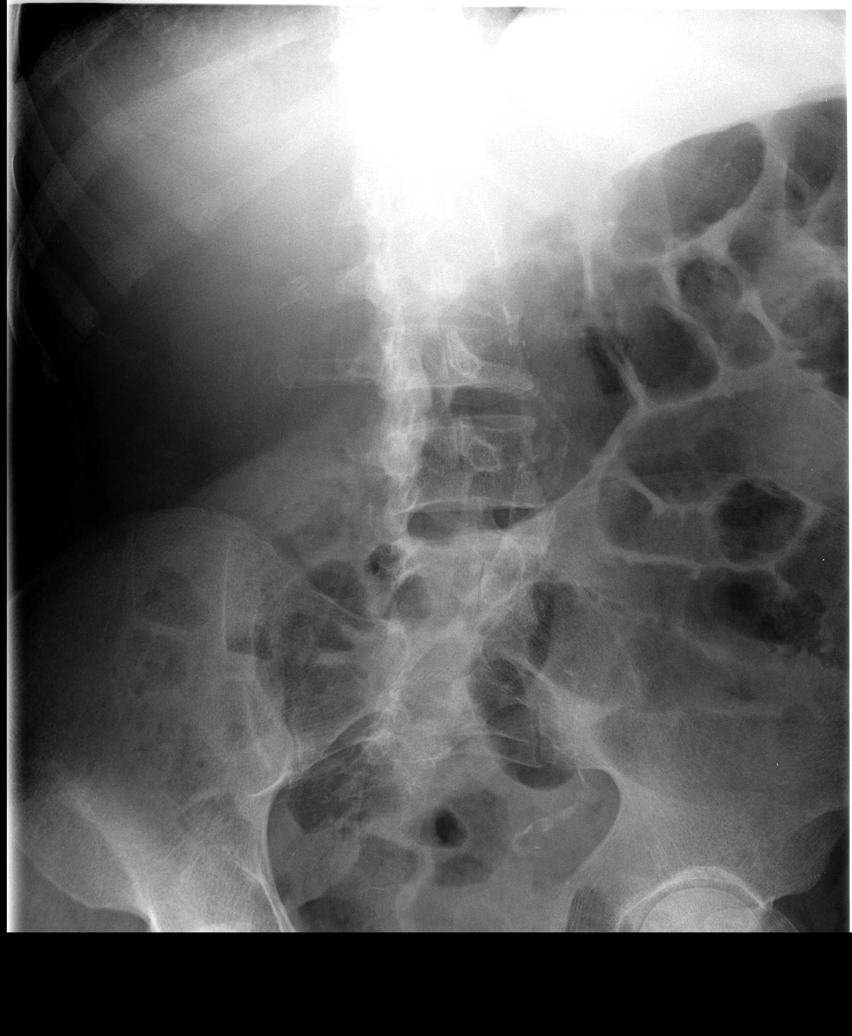

[view not recorded (3 of 5)]
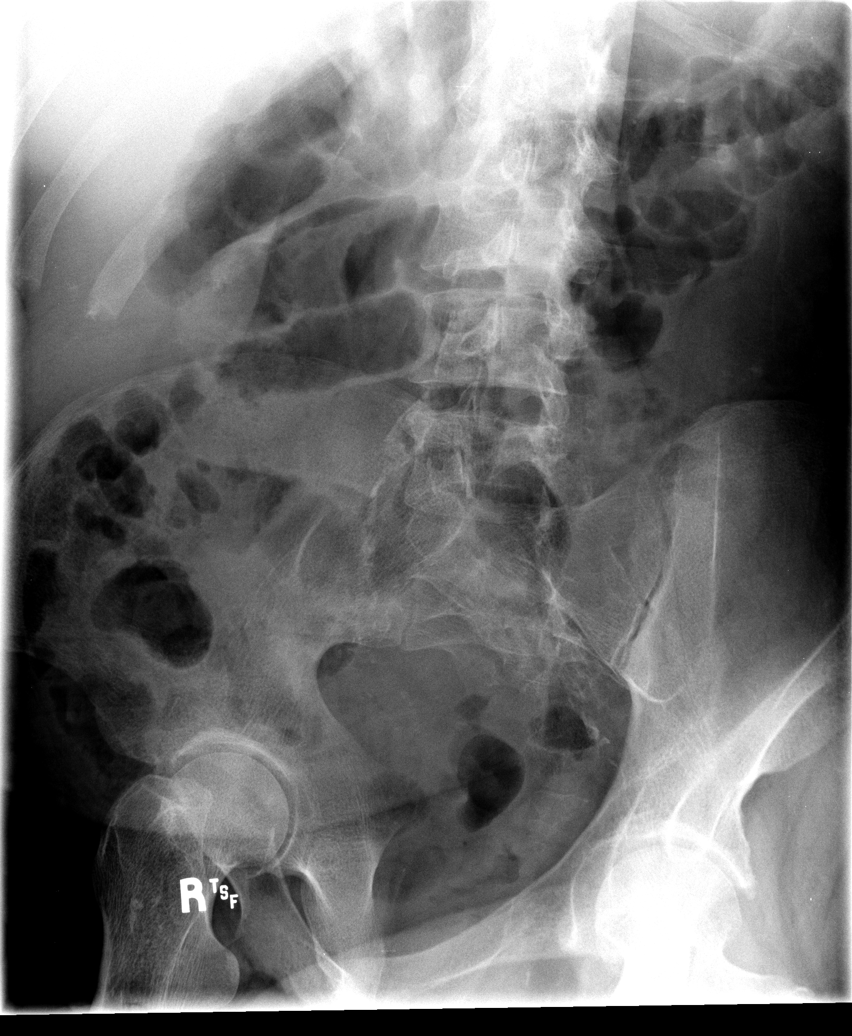

[view not recorded (4 of 5)]
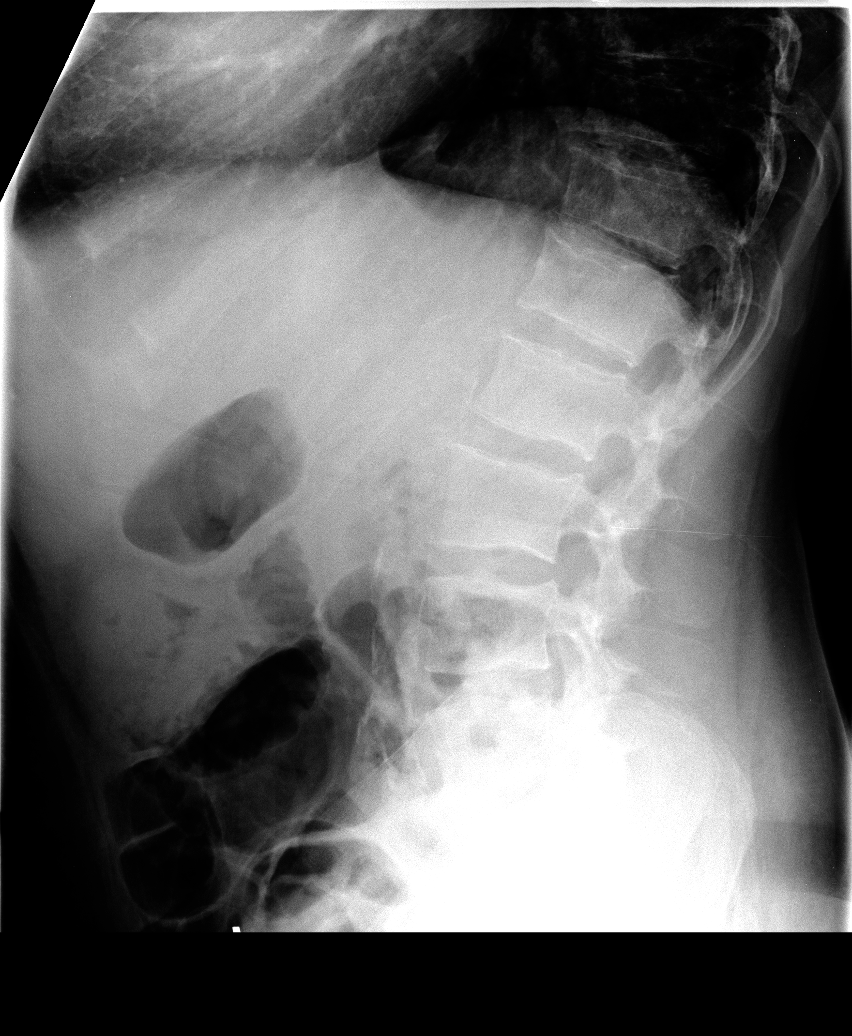

[view not recorded (5 of 5)]
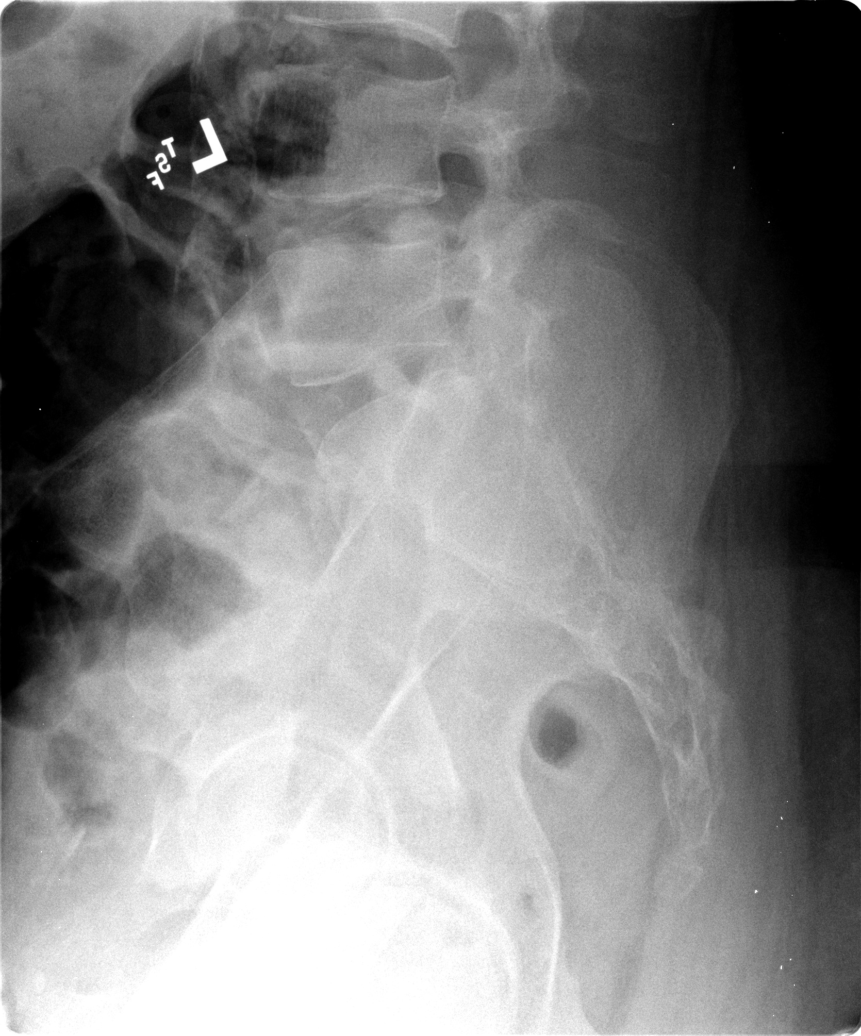

[5 of 5 positions shown; findings below may reference images not displayed]

FINDINGS: Numerous gas filled loops of bowel are noted.  This
partly obscures detail over the upper lumbar spine. Five non-rib
bearing lumbar type vertebral bodies are identified. Again noted
are multiple focal endplate depressions which may represent
Schmorl's nodes, unchanged since the prior study.  No overt
compression fracture is seen.  Premature atherosclerosis of the non-
aneurysmal aorta is noted.
IMPRESSION: No new acute bony abnormality.

Premature extensive atherosclerotic aortic calcification.

## 2011-09-03 NOTE — ED Notes (Signed)
Patient has dialysis on tue/thur/sat. Has been given duoneb per ems
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
## 2011-09-05 ENCOUNTER — Emergency Department (HOSPITAL_COMMUNITY)
Admission: EM | Admit: 2011-09-05 | Discharge: 2011-09-05 | Disposition: A | Discharge status: Home / Self Care | Payer: Medicare Other | Attending: Emergency Medicine | Admitting: Emergency Medicine

## 2011-09-05 ENCOUNTER — Emergency Department (HOSPITAL_COMMUNITY): Payer: Medicare Other

## 2011-09-05 ENCOUNTER — Encounter (HOSPITAL_COMMUNITY): Payer: Self-pay

## 2011-09-05 DIAGNOSIS — R1031 Right lower quadrant pain: Secondary | ICD-10-CM | POA: Insufficient documentation

## 2011-09-05 DIAGNOSIS — M549 Dorsalgia, unspecified: Secondary | ICD-10-CM | POA: Insufficient documentation

## 2011-09-05 DIAGNOSIS — J189 Pneumonia, unspecified organism: Secondary | ICD-10-CM | POA: Insufficient documentation

## 2011-09-05 DIAGNOSIS — R1032 Left lower quadrant pain: Secondary | ICD-10-CM | POA: Insufficient documentation

## 2011-09-05 MED ORDER — HYDROMORPHONE HCL 1 MG/ML IJ SOLN
1.0000 mg | Freq: Once | INTRAMUSCULAR | Status: AC
  Administered 2011-09-05: 1 mg via INTRAMUSCULAR
  Filled 2011-09-05: qty 1

## 2011-09-05 NOTE — ED Provider Notes (Signed)
Medical screening examination/treatment/procedure(s) were performed by non-physician practitioner and as supervising physician I was immediately available for consultation/collaboration.  Nicholes Stairs, MD 09/05/11 [phone]

## 2011-09-05 NOTE — ED Notes (Signed)
Pt c/o lower back pain x 1 week.  Denies any other symptoms

## 2011-09-05 NOTE — ED Provider Notes (Signed)
Medical screening examination/treatment/procedure(s) were performed by non-physician practitioner and as supervising physician I was immediately available for consultation/collaboration.  Nicholes Stairs, MD 09/05/11 [phone]
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
## 2011-09-06 ENCOUNTER — Emergency Department (HOSPITAL_COMMUNITY)
Admission: EM | Admit: 2011-09-06 | Discharge: 2011-09-06 | Disposition: A | Discharge status: Home / Self Care | Payer: Medicare Other | Attending: Emergency Medicine | Admitting: Emergency Medicine

## 2011-09-06 ENCOUNTER — Encounter (HOSPITAL_COMMUNITY): Payer: Self-pay | Admitting: Emergency Medicine

## 2011-09-06 DIAGNOSIS — I509 Heart failure, unspecified: Secondary | ICD-10-CM | POA: Insufficient documentation

## 2011-09-06 DIAGNOSIS — F209 Schizophrenia, unspecified: Secondary | ICD-10-CM | POA: Insufficient documentation

## 2011-09-06 DIAGNOSIS — Z888 Allergy status to other drugs, medicaments and biological substances status: Secondary | ICD-10-CM | POA: Insufficient documentation

## 2011-09-06 DIAGNOSIS — M549 Dorsalgia, unspecified: Secondary | ICD-10-CM | POA: Insufficient documentation

## 2011-09-06 DIAGNOSIS — R231 Pallor: Secondary | ICD-10-CM | POA: Insufficient documentation

## 2011-09-06 DIAGNOSIS — F17200 Nicotine dependence, unspecified, uncomplicated: Secondary | ICD-10-CM | POA: Insufficient documentation

## 2011-09-06 DIAGNOSIS — R109 Unspecified abdominal pain: Secondary | ICD-10-CM | POA: Insufficient documentation

## 2011-09-06 DIAGNOSIS — I10 Essential (primary) hypertension: Secondary | ICD-10-CM | POA: Insufficient documentation

## 2011-09-06 DIAGNOSIS — Z992 Dependence on renal dialysis: Secondary | ICD-10-CM | POA: Insufficient documentation

## 2011-09-06 DIAGNOSIS — G8929 Other chronic pain: Secondary | ICD-10-CM | POA: Insufficient documentation

## 2011-09-06 DIAGNOSIS — N289 Disorder of kidney and ureter, unspecified: Secondary | ICD-10-CM | POA: Insufficient documentation

## 2011-09-06 DIAGNOSIS — F319 Bipolar disorder, unspecified: Secondary | ICD-10-CM | POA: Insufficient documentation

## 2011-09-06 MED ORDER — HYDROMORPHONE HCL 1 MG/ML IJ SOLN
1.0000 mg | Freq: Once | INTRAMUSCULAR | Status: AC
  Administered 2011-09-06: 1 mg via INTRAMUSCULAR
  Filled 2011-09-06: qty 1
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
## 2011-09-06 NOTE — ED Notes (Signed)
Pt c/o lower rt back/flank pain x one week.

## 2011-09-07 ENCOUNTER — Emergency Department (HOSPITAL_COMMUNITY)
Admission: EM | Admit: 2011-09-07 | Discharge: 2011-09-07 | Disposition: A | Discharge status: Home / Self Care | Payer: Medicare Other | Attending: Emergency Medicine | Admitting: Emergency Medicine

## 2011-09-07 ENCOUNTER — Encounter (HOSPITAL_COMMUNITY): Payer: Self-pay

## 2011-09-07 DIAGNOSIS — Z79899 Other long term (current) drug therapy: Secondary | ICD-10-CM | POA: Insufficient documentation

## 2011-09-07 DIAGNOSIS — G8929 Other chronic pain: Secondary | ICD-10-CM

## 2011-09-07 DIAGNOSIS — F17200 Nicotine dependence, unspecified, uncomplicated: Secondary | ICD-10-CM | POA: Insufficient documentation

## 2011-09-07 DIAGNOSIS — N289 Disorder of kidney and ureter, unspecified: Secondary | ICD-10-CM | POA: Insufficient documentation

## 2011-09-07 DIAGNOSIS — F319 Bipolar disorder, unspecified: Secondary | ICD-10-CM | POA: Insufficient documentation

## 2011-09-07 DIAGNOSIS — R51 Headache: Secondary | ICD-10-CM | POA: Insufficient documentation

## 2011-09-07 DIAGNOSIS — I10 Essential (primary) hypertension: Secondary | ICD-10-CM | POA: Insufficient documentation

## 2011-09-07 DIAGNOSIS — I509 Heart failure, unspecified: Secondary | ICD-10-CM | POA: Insufficient documentation

## 2011-09-07 DIAGNOSIS — Z992 Dependence on renal dialysis: Secondary | ICD-10-CM | POA: Insufficient documentation

## 2011-09-07 DIAGNOSIS — Z9861 Coronary angioplasty status: Secondary | ICD-10-CM | POA: Insufficient documentation

## 2011-09-07 DIAGNOSIS — F209 Schizophrenia, unspecified: Secondary | ICD-10-CM | POA: Insufficient documentation

## 2011-09-07 MED ORDER — HYDROMORPHONE HCL 2 MG/ML IJ SOLN
2.0000 mg | Freq: Once | INTRAMUSCULAR | Status: AC
  Administered 2011-09-07: 2 mg via INTRAMUSCULAR
  Filled 2011-09-07: qty 1

## 2011-09-07 MED ORDER — PROMETHAZINE HCL 25 MG/ML IJ SOLN: 25.0000 mg | Freq: Once | INTRAMUSCULAR | Status: DC

## 2011-09-07 MED ORDER — PROMETHAZINE HCL 25 MG/ML IJ SOLN
25.0000 mg | Freq: Once | INTRAMUSCULAR | Status: AC
  Administered 2011-09-07: 25 mg via INTRAMUSCULAR
  Filled 2011-09-07: qty 1

## 2011-09-07 NOTE — Discharge Instructions (Signed)
RETURN AS NEEDED. FOLLOW UP WITH DIALYSIS AS SCHEDULED TOMORROW.
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
## 2011-09-07 NOTE — ED Notes (Signed)
Pt c/o lower back pain x 1 week.  Also reports migraine headache since this am.   Reports was nauseated and vomited around 0630 this am.

## 2011-09-07 NOTE — ED Notes (Signed)
Pt states his back has been hurting for a while. States his head started hurting at about 0200 this morning

## 2011-09-08 ENCOUNTER — Encounter (HOSPITAL_COMMUNITY): Payer: Self-pay | Admitting: *Deleted

## 2011-09-08 ENCOUNTER — Emergency Department (HOSPITAL_COMMUNITY)
Admission: EM | Admit: 2011-09-08 | Discharge: 2011-09-08 | Disposition: A | Discharge status: Home / Self Care | Payer: Medicare Other | Attending: Emergency Medicine | Admitting: Emergency Medicine

## 2011-09-08 DIAGNOSIS — M545 Low back pain, unspecified: Secondary | ICD-10-CM | POA: Insufficient documentation

## 2011-09-08 DIAGNOSIS — I10 Essential (primary) hypertension: Secondary | ICD-10-CM | POA: Insufficient documentation

## 2011-09-08 DIAGNOSIS — N189 Chronic kidney disease, unspecified: Secondary | ICD-10-CM | POA: Insufficient documentation

## 2011-09-08 DIAGNOSIS — M549 Dorsalgia, unspecified: Secondary | ICD-10-CM

## 2011-09-08 DIAGNOSIS — Z992 Dependence on renal dialysis: Secondary | ICD-10-CM | POA: Insufficient documentation

## 2011-09-08 MED ORDER — HYDROMORPHONE HCL 1 MG/ML IJ SOLN
1.0000 mg | Freq: Once | INTRAMUSCULAR | Status: AC
  Administered 2011-09-08: 1 mg via INTRAMUSCULAR
  Filled 2011-09-08: qty 1

## 2011-09-08 NOTE — ED Notes (Signed)
Pt c/o lower back pain and pain and swelling to left upper arm. Pt states his arm was bruised at dialysis and is now swelling around fistula.
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
## 2011-09-09 ENCOUNTER — Encounter (HOSPITAL_COMMUNITY): Payer: Self-pay | Admitting: *Deleted

## 2011-09-09 ENCOUNTER — Emergency Department (HOSPITAL_COMMUNITY)
Admission: EM | Admit: 2011-09-09 | Discharge: 2011-09-09 | Disposition: A | Discharge status: Home / Self Care | Payer: Medicare Other | Attending: Emergency Medicine | Admitting: Emergency Medicine

## 2011-09-09 ENCOUNTER — Emergency Department (HOSPITAL_COMMUNITY): Payer: Medicare Other

## 2011-09-09 DIAGNOSIS — G894 Chronic pain syndrome: Secondary | ICD-10-CM | POA: Insufficient documentation

## 2011-09-09 DIAGNOSIS — R109 Unspecified abdominal pain: Secondary | ICD-10-CM | POA: Insufficient documentation

## 2011-09-09 DIAGNOSIS — M545 Low back pain, unspecified: Secondary | ICD-10-CM | POA: Insufficient documentation

## 2011-09-09 DIAGNOSIS — I10 Essential (primary) hypertension: Secondary | ICD-10-CM | POA: Insufficient documentation

## 2011-09-09 DIAGNOSIS — F209 Schizophrenia, unspecified: Secondary | ICD-10-CM | POA: Insufficient documentation

## 2011-09-09 DIAGNOSIS — F17200 Nicotine dependence, unspecified, uncomplicated: Secondary | ICD-10-CM | POA: Insufficient documentation

## 2011-09-09 DIAGNOSIS — I509 Heart failure, unspecified: Secondary | ICD-10-CM | POA: Insufficient documentation

## 2011-09-09 DIAGNOSIS — M549 Dorsalgia, unspecified: Secondary | ICD-10-CM

## 2011-09-09 DIAGNOSIS — F319 Bipolar disorder, unspecified: Secondary | ICD-10-CM | POA: Insufficient documentation

## 2011-09-09 LAB — CBC
MCV: 90.8 fL (ref 78.0–100.0)
Platelets: 241 10*3/uL (ref 150–400)
RBC: 3.36 MIL/uL — ABNORMAL LOW (ref 4.22–5.81)
WBC: 5.3 10*3/uL (ref 4.0–10.5)

## 2011-09-09 LAB — URINALYSIS, ROUTINE W REFLEX MICROSCOPIC
Bilirubin Urine: NEGATIVE
Leukocytes, UA: NEGATIVE
Nitrite: NEGATIVE
Specific Gravity, Urine: 1.015 (ref 1.005–1.030)
Urobilinogen, UA: 0.2 mg/dL (ref 0.0–1.0)
pH: 8 (ref 5.0–8.0)

## 2011-09-09 LAB — COMPREHENSIVE METABOLIC PANEL
ALT: <5 U/L (ref 0–53)
Alkaline Phosphatase: 112 U/L (ref 39–117)
CO2: 25 mEq/L (ref 19–32)
Chloride: 99 mEq/L (ref 96–112)
GFR calc Af Amer: 11 mL/min — ABNORMAL LOW (ref >60)
GFR calc non Af Amer: 9 mL/min — ABNORMAL LOW (ref >60)
Glucose, Bld: 112 mg/dL — ABNORMAL HIGH (ref 70–99)
Potassium: 3.5 mEq/L (ref 3.5–5.1)
Sodium: 138 mEq/L (ref 135–145)

## 2011-09-09 LAB — DIFFERENTIAL
Lymphocytes Relative: 31 % (ref 12–46)
Lymphs Abs: 1.7 10*3/uL (ref 0.7–4.0)
Neutrophils Relative %: 59 % (ref 43–77)

## 2011-09-09 LAB — URINE MICROSCOPIC-ADD ON

## 2011-09-09 MED ORDER — ONDANSETRON 8 MG PO TBDP
8.0000 mg | ORAL_TABLET | Freq: Once | ORAL | Status: AC
  Administered 2011-09-09: 8 mg via ORAL
  Filled 2011-09-09: qty 1

## 2011-09-09 MED ORDER — HYDROMORPHONE HCL 1 MG/ML IJ SOLN
2.0000 mg | Freq: Once | INTRAMUSCULAR | Status: AC
  Administered 2011-09-09: 2 mg via INTRAMUSCULAR
  Filled 2011-09-09: qty 2

## 2011-09-09 MED ORDER — HYDROCODONE-ACETAMINOPHEN 5-325 MG PO TABS: 2.0000 | ORAL_TABLET | ORAL | Status: AC | PRN
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
## 2011-09-09 NOTE — ED Notes (Signed)
Pt c/o pain in back and kidneys. Pt states this pain started back this am.

## 2011-09-09 NOTE — ED Notes (Signed)
Dilaudid 2 mg IM Left Hip per pt's preference.  Tol well.  Rates pain a 9 now on 1-10 scale.

## 2011-09-09 NOTE — ED Notes (Signed)
Reports much relief 2nd to injectin--now rates pain a 3 on 1-10 scale.

## 2011-09-09 NOTE — ED Notes (Signed)
C/o right flank pain that began at 4 a.m. Today.  Dialysis patient--but, still makes small amts of urine--last voided yesterday and denies any increased frequency or urgency.  Rates pain 0 on 1-10 scale.  Had dialysis yesterday and was seen here yesterday as well.

## 2011-09-12 ENCOUNTER — Emergency Department (HOSPITAL_COMMUNITY)
Admission: EM | Admit: 2011-09-12 | Discharge: 2011-09-12 | Disposition: A | Discharge status: Home / Self Care | Payer: Medicare Other | Attending: Emergency Medicine | Admitting: Emergency Medicine

## 2011-09-12 ENCOUNTER — Encounter (HOSPITAL_COMMUNITY): Payer: Self-pay | Admitting: *Deleted

## 2011-09-12 ENCOUNTER — Emergency Department (HOSPITAL_COMMUNITY): Payer: Medicare Other

## 2011-09-12 DIAGNOSIS — I10 Essential (primary) hypertension: Secondary | ICD-10-CM | POA: Insufficient documentation

## 2011-09-12 DIAGNOSIS — R0602 Shortness of breath: Secondary | ICD-10-CM | POA: Insufficient documentation

## 2011-09-12 DIAGNOSIS — I509 Heart failure, unspecified: Secondary | ICD-10-CM | POA: Insufficient documentation

## 2011-09-12 DIAGNOSIS — F209 Schizophrenia, unspecified: Secondary | ICD-10-CM | POA: Insufficient documentation

## 2011-09-12 DIAGNOSIS — R059 Cough, unspecified: Secondary | ICD-10-CM | POA: Insufficient documentation

## 2011-09-12 DIAGNOSIS — Z888 Allergy status to other drugs, medicaments and biological substances status: Secondary | ICD-10-CM | POA: Insufficient documentation

## 2011-09-12 DIAGNOSIS — F319 Bipolar disorder, unspecified: Secondary | ICD-10-CM | POA: Insufficient documentation

## 2011-09-12 DIAGNOSIS — J40 Bronchitis, not specified as acute or chronic: Secondary | ICD-10-CM

## 2011-09-12 DIAGNOSIS — F17200 Nicotine dependence, unspecified, uncomplicated: Secondary | ICD-10-CM | POA: Insufficient documentation

## 2011-09-12 DIAGNOSIS — R05 Cough: Secondary | ICD-10-CM | POA: Insufficient documentation

## 2011-09-12 LAB — CBC
MCH: 29.8 pg (ref 26.0–34.0)
MCHC: 33.1 g/dL (ref 30.0–36.0)
Platelets: 221 10*3/uL (ref 150–400)

## 2011-09-12 LAB — BASIC METABOLIC PANEL
BUN: 36 mg/dL — ABNORMAL HIGH (ref 6–23)
Calcium: 10.5 mg/dL (ref 8.4–10.5)
GFR calc Af Amer: 8 mL/min — ABNORMAL LOW (ref >60)
GFR calc non Af Amer: 7 mL/min — ABNORMAL LOW (ref >60)
Potassium: 4.2 mEq/L (ref 3.5–5.1)
Sodium: 136 mEq/L (ref 135–145)

## 2011-09-12 LAB — DIFFERENTIAL
Basophils Relative: 0 % (ref 0–1)
Eosinophils Absolute: 0.1 10*3/uL (ref 0.0–0.7)
Eosinophils Relative: 2 % (ref 0–5)
Monocytes Relative: 5 % (ref 3–12)
Neutrophils Relative %: 72 % (ref 43–77)

## 2011-09-12 MED ORDER — HYDROMORPHONE HCL 1 MG/ML IJ SOLN
1.0000 mg | Freq: Once | INTRAMUSCULAR | Status: AC
  Administered 2011-09-12: 1 mg via INTRAVENOUS
  Filled 2011-09-12: qty 1

## 2011-09-12 MED ORDER — VANCOMYCIN HCL IN DEXTROSE 1-5 GM/200ML-% IV SOLN: 1000.0000 mg | Freq: Once | INTRAVENOUS | Status: DC

## 2011-09-12 MED ORDER — PIPERACILLIN-TAZOBACTAM 3.375 G IVPB
3.3750 g | Freq: Once | INTRAVENOUS | Status: AC
  Administered 2011-09-12: 3.375 g via INTRAVENOUS
  Filled 2011-09-12: qty 50

## 2011-09-12 MED ORDER — ALBUTEROL SULFATE (5 MG/ML) 0.5% IN NEBU
5.0000 mg | INHALATION_SOLUTION | Freq: Once | RESPIRATORY_TRACT | Status: AC
  Administered 2011-09-12: 5 mg via RESPIRATORY_TRACT
  Filled 2011-09-12: qty 1

## 2011-09-12 MED ORDER — AZITHROMYCIN 250 MG PO TABS: 250.0000 mg | ORAL_TABLET | Freq: Every day | ORAL | Status: AC

## 2011-09-12 MED ORDER — HYDROCODONE-ACETAMINOPHEN 5-325 MG PO TABS: 1.0000 | ORAL_TABLET | Freq: Four times a day (QID) | ORAL | Status: DC | PRN

## 2011-09-12 MED ORDER — HYDROCODONE-ACETAMINOPHEN 5-325 MG PO TABS
1.0000 | ORAL_TABLET | Freq: Once | ORAL | Status: AC
  Administered 2011-09-12: 1 via ORAL
  Filled 2011-09-12: qty 1
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
## 2011-09-12 NOTE — ED Notes (Signed)
Pt c/o shortness of breath since 4 am this morning. Also c/o back pain and pain in his kidneys.

## 2011-09-18 ENCOUNTER — Encounter (HOSPITAL_COMMUNITY): Payer: Self-pay | Admitting: Emergency Medicine

## 2011-09-18 ENCOUNTER — Other Ambulatory Visit: Payer: Self-pay

## 2011-09-18 ENCOUNTER — Inpatient Hospital Stay (HOSPITAL_COMMUNITY)
Admission: EM | Admit: 2011-09-18 | Discharge: 2011-09-21 | DRG: 193 | Disposition: A | Discharge status: Home / Self Care | Payer: Medicare Other | Attending: Family Medicine | Admitting: Family Medicine

## 2011-09-18 ENCOUNTER — Emergency Department (HOSPITAL_COMMUNITY): Payer: Medicare Other

## 2011-09-18 DIAGNOSIS — I120 Hypertensive chronic kidney disease with stage 5 chronic kidney disease or end stage renal disease: Secondary | ICD-10-CM | POA: Diagnosis present

## 2011-09-18 DIAGNOSIS — J189 Pneumonia, unspecified organism: Principal | ICD-10-CM | POA: Diagnosis present

## 2011-09-18 DIAGNOSIS — F209 Schizophrenia, unspecified: Secondary | ICD-10-CM | POA: Diagnosis present

## 2011-09-18 DIAGNOSIS — E785 Hyperlipidemia, unspecified: Secondary | ICD-10-CM | POA: Diagnosis present

## 2011-09-18 DIAGNOSIS — I509 Heart failure, unspecified: Secondary | ICD-10-CM | POA: Diagnosis present

## 2011-09-18 DIAGNOSIS — N039 Chronic nephritic syndrome with unspecified morphologic changes: Secondary | ICD-10-CM | POA: Diagnosis present

## 2011-09-18 DIAGNOSIS — F17200 Nicotine dependence, unspecified, uncomplicated: Secondary | ICD-10-CM | POA: Diagnosis present

## 2011-09-18 DIAGNOSIS — Z992 Dependence on renal dialysis: Secondary | ICD-10-CM

## 2011-09-18 DIAGNOSIS — F319 Bipolar disorder, unspecified: Secondary | ICD-10-CM | POA: Diagnosis present

## 2011-09-18 DIAGNOSIS — J449 Chronic obstructive pulmonary disease, unspecified: Secondary | ICD-10-CM | POA: Diagnosis present

## 2011-09-18 DIAGNOSIS — N186 End stage renal disease: Secondary | ICD-10-CM | POA: Diagnosis present

## 2011-09-18 DIAGNOSIS — D631 Anemia in chronic kidney disease: Secondary | ICD-10-CM | POA: Diagnosis present

## 2011-09-18 DIAGNOSIS — J4489 Other specified chronic obstructive pulmonary disease: Secondary | ICD-10-CM | POA: Diagnosis present

## 2011-09-18 DIAGNOSIS — E871 Hypo-osmolality and hyponatremia: Secondary | ICD-10-CM | POA: Diagnosis not present

## 2011-09-18 DIAGNOSIS — F609 Personality disorder, unspecified: Secondary | ICD-10-CM | POA: Diagnosis present

## 2011-09-18 LAB — COMPREHENSIVE METABOLIC PANEL
ALT: 8 U/L (ref 0–53)
AST: 27 U/L (ref 0–37)
Albumin: 4 g/dL (ref 3.5–5.2)
Alkaline Phosphatase: 116 U/L (ref 39–117)
Chloride: 94 mEq/L — ABNORMAL LOW (ref 96–112)
Potassium: 4.3 mEq/L (ref 3.5–5.1)
Sodium: 135 mEq/L (ref 135–145)
Total Bilirubin: 0.6 mg/dL (ref 0.3–1.2)
Total Protein: 7.1 g/dL (ref 6.0–8.3)

## 2011-09-18 LAB — DIFFERENTIAL
Basophils Absolute: 0 10*3/uL (ref 0.0–0.1)
Basophils Relative: 0 % (ref 0–1)
Eosinophils Absolute: 0.3 10*3/uL (ref 0.0–0.7)
Monocytes Relative: 4 % (ref 3–12)
Neutro Abs: 5.1 10*3/uL (ref 1.7–7.7)
Neutrophils Relative %: 70 % (ref 43–77)

## 2011-09-18 LAB — CBC
Hemoglobin: 10.8 g/dL — ABNORMAL LOW (ref 13.0–17.0)
MCHC: 32.7 g/dL (ref 30.0–36.0)
Platelets: 268 10*3/uL (ref 150–400)

## 2011-09-18 MED ORDER — LABETALOL HCL 200 MG PO TABS
400.0000 mg | ORAL_TABLET | Freq: Two times a day (BID) | ORAL | Status: DC
  Administered 2011-09-19 – 2011-09-21 (×4): 400 mg via ORAL
  Filled 2011-09-18 (×4): qty 2

## 2011-09-18 MED ORDER — ONDANSETRON HCL 4 MG/2ML IJ SOLN
4.0000 mg | Freq: Once | INTRAMUSCULAR | Status: AC
  Administered 2011-09-18: 4 mg via INTRAVENOUS
  Filled 2011-09-18: qty 2

## 2011-09-18 MED ORDER — SEVELAMER CARBONATE 800 MG PO TABS
2400.0000 mg | ORAL_TABLET | Freq: Three times a day (TID) | ORAL | Status: DC
  Administered 2011-09-19 – 2011-09-21 (×8): 2400 mg via ORAL
  Filled 2011-09-18 (×8): qty 3

## 2011-09-18 MED ORDER — ENOXAPARIN SODIUM 30 MG/0.3ML ~~LOC~~ SOLN
30.0000 mg | Freq: Two times a day (BID) | SUBCUTANEOUS | Status: DC
  Administered 2011-09-19 – 2011-09-20 (×3): 30 mg via SUBCUTANEOUS
  Filled 2011-09-18 (×3): qty 0.3

## 2011-09-18 MED ORDER — FAMOTIDINE 20 MG PO TABS
20.0000 mg | ORAL_TABLET | Freq: Two times a day (BID) | ORAL | Status: DC
  Administered 2011-09-19 – 2011-09-21 (×5): 20 mg via ORAL
  Filled 2011-09-18 (×5): qty 1

## 2011-09-18 MED ORDER — VANCOMYCIN HCL IN DEXTROSE 1-5 GM/200ML-% IV SOLN
INTRAVENOUS | Status: AC
  Filled 2011-09-18: qty 200

## 2011-09-18 MED ORDER — MORPHINE SULFATE 4 MG/ML IJ SOLN
4.0000 mg | Freq: Once | INTRAMUSCULAR | Status: AC
  Administered 2011-09-18: 4 mg via INTRAVENOUS

## 2011-09-18 MED ORDER — HYDROXYZINE HCL 25 MG PO TABS
25.0000 mg | ORAL_TABLET | Freq: Two times a day (BID) | ORAL | Status: DC
  Administered 2011-09-19 – 2011-09-21 (×5): 25 mg via ORAL
  Filled 2011-09-18 (×5): qty 1

## 2011-09-18 MED ORDER — DIPHENHYDRAMINE HCL 25 MG PO CAPS: 50.0000 mg | ORAL_CAPSULE | ORAL | Status: DC | PRN

## 2011-09-18 MED ORDER — HYDROCODONE-ACETAMINOPHEN 5-325 MG PO TABS
1.0000 | ORAL_TABLET | ORAL | Status: DC | PRN
  Administered 2011-09-18 – 2011-09-21 (×9): 1 via ORAL
  Filled 2011-09-18 (×9): qty 1

## 2011-09-18 MED ORDER — CLONIDINE HCL 0.2 MG PO TABS
0.2000 mg | ORAL_TABLET | Freq: Two times a day (BID) | ORAL | Status: DC
  Administered 2011-09-18 – 2011-09-21 (×5): 0.2 mg via ORAL
  Filled 2011-09-18 (×5): qty 1

## 2011-09-18 MED ORDER — AMLODIPINE BESYLATE 5 MG PO TABS
10.0000 mg | ORAL_TABLET | Freq: Every day | ORAL | Status: DC
  Administered 2011-09-19 – 2011-09-21 (×2): 10 mg via ORAL
  Filled 2011-09-18: qty 1
  Filled 2011-09-18: qty 2
  Filled 2011-09-18: qty 1

## 2011-09-18 MED ORDER — DULOXETINE HCL 60 MG PO CPEP
60.0000 mg | ORAL_CAPSULE | Freq: Every day | ORAL | Status: DC
  Administered 2011-09-19 – 2011-09-21 (×3): 60 mg via ORAL
  Filled 2011-09-18 (×3): qty 1

## 2011-09-18 MED ORDER — ROSUVASTATIN CALCIUM 20 MG PO TABS
20.0000 mg | ORAL_TABLET | Freq: Every day | ORAL | Status: DC
  Administered 2011-09-19 – 2011-09-20 (×2): 20 mg via ORAL
  Filled 2011-09-18 (×2): qty 1

## 2011-09-18 MED ORDER — CLOPIDOGREL BISULFATE 75 MG PO TABS
75.0000 mg | ORAL_TABLET | Freq: Every day | ORAL | Status: DC
  Administered 2011-09-19 – 2011-09-21 (×3): 75 mg via ORAL
  Filled 2011-09-18 (×3): qty 1

## 2011-09-18 MED ORDER — PIPERACILLIN-TAZOBACTAM 3.375 G IVPB
3.3750 g | Freq: Once | INTRAVENOUS | Status: AC
  Administered 2011-09-18: 3.375 g via INTRAVENOUS
  Filled 2011-09-18: qty 50

## 2011-09-18 MED ORDER — ALBUTEROL SULFATE HFA 108 (90 BASE) MCG/ACT IN AERS: 2.0000 | INHALATION_SPRAY | RESPIRATORY_TRACT | Status: DC | PRN

## 2011-09-18 MED ORDER — ZOLPIDEM TARTRATE 5 MG PO TABS
10.0000 mg | ORAL_TABLET | Freq: Every day | ORAL | Status: DC
  Administered 2011-09-18 – 2011-09-20 (×3): 10 mg via ORAL
  Filled 2011-09-18 (×3): qty 2

## 2011-09-18 MED ORDER — ROPINIROLE HCL 1 MG PO TABS
1.0000 mg | ORAL_TABLET | Freq: Every day | ORAL | Status: DC
  Administered 2011-09-19 – 2011-09-21 (×3): 1 mg via ORAL
  Filled 2011-09-18 (×5): qty 1

## 2011-09-18 MED ORDER — NEPHRO-VITE 0.8 MG PO TABS
1.0000 | ORAL_TABLET | Freq: Every day | ORAL | Status: DC
  Administered 2011-09-18 – 2011-09-20 (×3): 1 via ORAL
  Filled 2011-09-18 (×3): qty 1

## 2011-09-18 MED ORDER — LORATADINE 10 MG PO TABS
10.0000 mg | ORAL_TABLET | Freq: Every day | ORAL | Status: DC
  Administered 2011-09-19 – 2011-09-21 (×3): 10 mg via ORAL
  Filled 2011-09-18 (×3): qty 1

## 2011-09-18 MED ORDER — CARVEDILOL 12.5 MG PO TABS
12.5000 mg | ORAL_TABLET | Freq: Two times a day (BID) | ORAL | Status: DC
  Administered 2011-09-19 – 2011-09-21 (×5): 12.5 mg via ORAL
  Filled 2011-09-18 (×5): qty 1

## 2011-09-18 MED ORDER — VANCOMYCIN HCL IN DEXTROSE 1-5 GM/200ML-% IV SOLN
1000.0000 mg | Freq: Once | INTRAVENOUS | Status: AC
  Administered 2011-09-18: 1000 mg via INTRAVENOUS

## 2011-09-18 MED ORDER — LISINOPRIL 10 MG PO TABS
20.0000 mg | ORAL_TABLET | Freq: Every day | ORAL | Status: DC
  Administered 2011-09-19 – 2011-09-21 (×2): 20 mg via ORAL
  Filled 2011-09-18 (×2): qty 1
  Filled 2011-09-18: qty 2

## 2011-09-18 MED ORDER — HYDRALAZINE HCL 25 MG PO TABS
50.0000 mg | ORAL_TABLET | Freq: Three times a day (TID) | ORAL | Status: DC
  Administered 2011-09-19 – 2011-09-21 (×7): 50 mg via ORAL
  Filled 2011-09-18 (×7): qty 2

## 2011-09-18 MED ORDER — OLANZAPINE 5 MG PO TABS
10.0000 mg | ORAL_TABLET | Freq: Two times a day (BID) | ORAL | Status: DC
  Administered 2011-09-19 – 2011-09-21 (×5): 10 mg via ORAL
  Filled 2011-09-18 (×5): qty 2

## 2011-09-18 MED ORDER — PANTOPRAZOLE SODIUM 40 MG PO TBEC
40.0000 mg | DELAYED_RELEASE_TABLET | Freq: Every day | ORAL | Status: DC
  Administered 2011-09-19 – 2011-09-21 (×3): 40 mg via ORAL
  Filled 2011-09-18 (×3): qty 1

## 2011-09-18 MED ORDER — SODIUM CHLORIDE 0.45 % IV SOLN
INTRAVENOUS | Status: DC
  Administered 2011-09-18 – 2011-09-20 (×4): via INTRAVENOUS

## 2011-09-18 NOTE — H&P (Signed)
NAME:  [name], [name]               ACCOUNT NO.:  [id number]  MEDICAL RECORD NO.:  [id number]  LOCATION:  A327                          FACILITY:  [facility]  PHYSICIAN:  Cozette Braggs G. Renard Matter, MD   DATE OF BIRTH:  1976-08-09  DATE OF ADMISSION:  09/18/2011 DATE OF DISCHARGE:  LH                             HISTORY & PHYSICAL   This 35 year old male who has end-stage renal disease and on dialysis presented to the emergency department with increasing shortness of breath and productive cough.  He apparently had been seen previously and had been placed on Zithromax because pneumonia had not improved, continued to have shortness of breath, which had gradually worsened and productive sputum.  A chest x-ray showed mild central vascular congestion and mild perihilar interstitial prominence suspicious for mild edema, persistent infiltrate and consolidation of right lower lobe. It was felt by the emergency room physician that the patient should be admitted and placed on vancomycin and Zosyn, which was accomplished.  SOCIAL HISTORY:  The patient continues to smoke approximately a pack a day, does not use alcohol.  PAST MEDICAL HISTORY:  The patient has a prior history of: 1. Hypertension. 2. Renal insufficiency and is on dialysis. 3. Bipolar disorder. 4. Schizophrenia. 5. Chronic pain syndrome. 6. Left ventricular systolic dysfunction. 7. CHF.  SURGICAL PROCEDURES:  Include: 1. Coronary angioplasty with stent placement. 2. AV fistula placement. 3. Esophagogastroduodenoscopy with a finding of esophageal erosion,     erosive reflux, and small hiatal hernia.  ALLERGIES:  METHADONE, SIMVASTATIN, FENTANYL, IBUPROFEN, KETOROLAC, TROMETHAMINE, NAPROXEN, TRAMADOL.  HOME MEDICATION LIST: 1. Albuterol inhaler. 2. Amlodipine 10 mg daily. 3. Aspirin 81 mg daily. 4. Azithromycin 250 mg daily. 5. Nephro-Vite 0.8 mg at bedtime. 6. Carvedilol 12.5 mg 2 times daily. 7. Clonidine 0.2 mg twice daily. 8.  Clopidogrel 75 mg daily. 9. Dexlansoprazole 60 mg daily. 10.Benadryl 25 mg 2 tablets every 4 hours as needed for itching. 11.Duloxetine hydrochloride 60 mg daily. 12.Famotidine 20 mg twice a day. 13.Hydralazine 50 mg 3 times a day. 14.Hydrocodone 5/325 every 4 hours as needed for pain. 15.Labetalol 200 mg tablets 400 mg b.i.d. 16.Lisinopril 20 mg b.i.d. 17.Loratadine 10 mg daily. 18.Olanzapine 10 mg 2 times a day. 19.Ropinirole 1 mg every morning. 20.Rosuvastatin calcium 20 mg daily. 21.Sevelamer carbonate 800 mg tablets 2400-4000 mg t.i.d. with meals. 22.Zolpidem tartrate 10 mg at bedtime as needed for sleep.  REVIEW OF SYSTEMS:  HEENT:  Negative.  CARDIOPULMONARY:  The patient has been coughing yellow sputum and has had dyspnea, right-sided pleuritic chest pain intermittently.  GI:  No nausea, vomiting, or diarrhea.  GU: The patient does not produce any urine of significance, he is on dialysis.  PHYSICAL EXAMINATION:  GENERAL:  Alert male, who does not appear to be extremely uncomfortable. VITAL SIGNS:  Blood pressure 193/126 with pulse 116, temperature 98.2. HEENT:  Eyes:  PERRLA.  TMs negative.  Oropharynx benign. NECK:  Supple.  No JVD or thyroid abnormalities. HEART:  Regular rhythm.  No murmurs. LUNGS:  Rhonchi over lower lung fields. ABDOMEN:  No palpable organs or masses.  No organomegaly. EXTREMITIES:  Free of edema, fistula in left upper arm. NEUROLOGIC:  No focal neurological  abnormalities. SKIN:  Warm and dry.  ASSESSMENT:  The patient was readmitted to the hospital with right lower lobe pneumonia.  He does have hypertension, renal insufficiency, bipolar disorder, schizophrenia, chronic pain syndrome, left ventricular systolic dysfunction.  PLAN:  To start the patient on IV fluids with IV vancomycin and Zosyn dosed by pharmacy.  Continue his other previously prescribed medications.     Reneta Niehaus G. Renard Matter, MD     AGM/MEDQ  D:  09/18/2011  T:  09/18/2011   Job:  [id number]

## 2011-09-18 NOTE — ED Notes (Signed)
Pt c/o sob since this am. Renal pt-full dialysis yesterday.
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
## 2011-09-18 NOTE — ED Notes (Signed)
Dr Renard Matter aware of pt BP. No further orders obtained. Pt BP consistently high.

## 2011-09-19 LAB — URINALYSIS, ROUTINE W REFLEX MICROSCOPIC
Nitrite: NEGATIVE
Protein, ur: 100 — AB
Specific Gravity, Urine: 1.01
Urobilinogen, UA: 0.2

## 2011-09-19 LAB — COMPREHENSIVE METABOLIC PANEL
ALT: 10
AST: 11
AST: 16
Albumin: 3.6
CO2: 22
Calcium: 8.7
Calcium: 9
Creatinine, Ser: 8.49 — ABNORMAL HIGH
GFR calc Af Amer: 8 — ABNORMAL LOW
GFR calc Af Amer: 9 — ABNORMAL LOW
GFR calc non Af Amer: 7 — ABNORMAL LOW
Sodium: 121 — ABNORMAL LOW
Sodium: 126 — ABNORMAL LOW
Total Protein: 6.1

## 2011-09-19 LAB — DIFFERENTIAL
Eosinophils Absolute: 0.1
Eosinophils Relative: 2
Eosinophils Relative: 2
Lymphocytes Relative: 23
Lymphocytes Relative: 25
Lymphs Abs: 1.1
Lymphs Abs: 1.2
Lymphs Abs: 1.9
Monocytes Absolute: 0.4
Monocytes Absolute: 0.5
Monocytes Relative: 10
Monocytes Relative: 7
Monocytes Relative: 9
Neutro Abs: 3.2

## 2011-09-19 LAB — FERRITIN: Ferritin: 390 — ABNORMAL HIGH (ref 22–322)

## 2011-09-19 LAB — LIPASE, BLOOD
Lipase: 129 — ABNORMAL HIGH
Lipase: 25

## 2011-09-19 LAB — PROTIME-INR: INR: 1

## 2011-09-19 LAB — ACETAMINOPHEN LEVEL: Acetaminophen (Tylenol), Serum: <10 — ABNORMAL LOW

## 2011-09-19 LAB — MAGNESIUM: Magnesium: 1.9 mg/dL (ref 1.5–2.5)

## 2011-09-19 LAB — CBC
HCT: 32.6 — ABNORMAL LOW
Hemoglobin: 11.4 — ABNORMAL LOW
MCHC: 34.6
MCHC: 35.3
MCV: 94.3
Platelets: 201
RBC: 3.45 — ABNORMAL LOW
RBC: 3.48 — ABNORMAL LOW
WBC: 4.7
WBC: 5.5

## 2011-09-19 LAB — BASIC METABOLIC PANEL
CO2: 23
Glucose, Bld: 87
Potassium: 4.3
Sodium: 127 — ABNORMAL LOW

## 2011-09-19 LAB — RAPID URINE DRUG SCREEN, HOSP PERFORMED
Amphetamines: NOT DETECTED
Cocaine: NOT DETECTED
Opiates: POSITIVE — AB
Tetrahydrocannabinol: NOT DETECTED

## 2011-09-19 LAB — APTT: aPTT: 33

## 2011-09-19 LAB — IRON AND TIBC
Saturation Ratios: 16 — ABNORMAL LOW
UIBC: 205

## 2011-09-19 LAB — FOLATE: Folate: 4

## 2011-09-19 LAB — URINE MICROSCOPIC-ADD ON

## 2011-09-19 LAB — RETICULOCYTES: Retic Ct Pct: 1.3

## 2011-09-19 LAB — D-DIMER, QUANTITATIVE: D-Dimer, Quant: 0.48 ug/mL-FEU (ref 0.00–0.48)

## 2011-09-19 LAB — AMYLASE: Amylase: 96

## 2011-09-19 MED ORDER — ASPIRIN 81 MG PO CHEW
81.0000 mg | CHEWABLE_TABLET | Freq: Every day | ORAL | Status: DC
  Administered 2011-09-19 – 2011-09-21 (×3): 81 mg via ORAL
  Filled 2011-09-19 (×3): qty 1

## 2011-09-19 MED ORDER — IPRATROPIUM BROMIDE 0.02 % IN SOLN
0.5000 mg | RESPIRATORY_TRACT | Status: DC
  Administered 2011-09-19: 0.5 mg via RESPIRATORY_TRACT
  Filled 2011-09-19 (×2): qty 2.5

## 2011-09-19 MED ORDER — IPRATROPIUM-ALBUTEROL 0.5-2.5 (3) MG/3ML IN SOLN: 3.0000 mL | RESPIRATORY_TRACT | Status: DC

## 2011-09-19 MED ORDER — PIPERACILLIN-TAZOBACTAM IN DEX 2-0.25 GM/50ML IV SOLN
2.2500 g | Freq: Three times a day (TID) | INTRAVENOUS | Status: DC
  Administered 2011-09-19 – 2011-09-21 (×8): 2.25 g via INTRAVENOUS
  Filled 2011-09-19 (×11): qty 50

## 2011-09-19 MED ORDER — VANCOMYCIN HCL IN DEXTROSE 1-5 GM/200ML-% IV SOLN
1000.0000 mg | INTRAVENOUS | Status: DC
  Administered 2011-09-20: 1000 mg via INTRAVENOUS
  Filled 2011-09-19 (×2): qty 200

## 2011-09-19 MED ORDER — ALBUTEROL SULFATE (5 MG/ML) 0.5% IN NEBU
2.5000 mg | INHALATION_SOLUTION | RESPIRATORY_TRACT | Status: DC
  Administered 2011-09-19: 2.5 mg via RESPIRATORY_TRACT
  Filled 2011-09-19 (×2): qty 0.5

## 2011-09-19 NOTE — Progress Notes (Signed)
NAME:  [name], [name] NO.:  [id number]  MEDICAL RECORD NO.:  [id number]  LOCATION:                                 FACILITY:  PHYSICIAN:  Melvyn Novas, MDDATE OF BIRTH:  08/06/1976  DATE OF PROCEDURE: DATE OF DISCHARGE:                                PROGRESS NOTE   PROBLEMS:  As follows: 1. Questionable right lower lobe infiltrate and perihilar vascular     congestion, acute on chronic. 2. Chronic obstructive pulmonary disease. 3. End-stage renal disease on dialysis. 4. Hypertension, fairly well controlled. 5. Two-vessel coronary artery disease with ischemic cardiomyopathy,     ejection fraction 40%. 6. Hyperlipidemia. 7. Personality disorder. 8. Bipolar disorder. 9. Schizophrenia. 10.Depression. 11.Anxiety. 12.Status post overdose with Xanax previously.  PHYSICAL EXAMINATION:  VITAL SIGNS:  Temperature 98, blood pressure 136/74, pulse is 64 and regular, O2 sats 100% on 2 L. NECK:  No JVD. LUNGS:  Diminished breath sounds at the bases.  Scattered rhonchi, prolonged expiratory phase.  No wheeze, no rales audible. HEART:  Regular rate and rhythm.  No S3 or S4. ABDOMEN:  Soft, nontender.  Bowel sounds normoactive.  LABORATORY DATA:  WBCs are 7.3, hemoglobin 10.8, platelets 268,000.  ASSESSMENT/PLAN:  The plan is to continue vancomycin and Zosyn as ordered, DuoNeb nebulizer q.4 h. p.r.n. while awake.  Continue all current medicines.  Get BMET daily.  Dialysis is scheduled for Tuesday tomorrow.  Continue all current medicines and make further recommendations as the database expands.     Melvyn Novas, MD     RMD/MEDQ  D:  09/19/2011  T:  09/19/2011  Job:  [id number]
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
## 2011-09-19 NOTE — Progress Notes (Signed)
[id number] 

## 2011-09-20 ENCOUNTER — Inpatient Hospital Stay (HOSPITAL_COMMUNITY): Payer: Medicare Other

## 2011-09-20 LAB — DIFFERENTIAL
Basophils Absolute: 0 10*3/uL (ref 0.0–0.1)
Eosinophils Relative: 7 % — ABNORMAL HIGH (ref 0–5)
Lymphocytes Relative: 33 % (ref 12–46)
Monocytes Absolute: 0.3 10*3/uL (ref 0.1–1.0)

## 2011-09-20 LAB — BASIC METABOLIC PANEL
BUN: 38 mg/dL — ABNORMAL HIGH (ref 6–23)
Calcium: 9.7 mg/dL (ref 8.4–10.5)
Creatinine, Ser: 8.35 mg/dL — ABNORMAL HIGH (ref 0.50–1.35)
GFR calc non Af Amer: 7 mL/min — ABNORMAL LOW (ref >90)
Glucose, Bld: 94 mg/dL (ref 70–99)
Potassium: 4.9 mEq/L (ref 3.5–5.1)

## 2011-09-20 LAB — CBC
HCT: 28 % — ABNORMAL LOW (ref 39.0–52.0)
MCHC: 32.1 g/dL (ref 30.0–36.0)
RDW: 17.5 % — ABNORMAL HIGH (ref 11.5–15.5)

## 2011-09-20 MED ORDER — HEPARIN SODIUM (PORCINE) 1000 UNIT/ML DIALYSIS
500.0000 [IU]/h | INTRAMUSCULAR | Status: DC | PRN
  Administered 2011-09-20 (×2): 500 [IU]/h via INTRAVENOUS_CENTRAL

## 2011-09-20 MED ORDER — HEPARIN SODIUM (PORCINE) 1000 UNIT/ML DIALYSIS
2000.0000 [IU] | INTRAMUSCULAR | Status: DC | PRN
  Administered 2011-09-20: 2000 [IU] via INTRAVENOUS_CENTRAL

## 2011-09-20 MED ORDER — IPRATROPIUM BROMIDE 0.02 % IN SOLN: 0.5000 mg | Freq: Three times a day (TID) | RESPIRATORY_TRACT | Status: DC

## 2011-09-20 MED ORDER — IPRATROPIUM BROMIDE 0.02 % IN SOLN
0.5000 mg | Freq: Three times a day (TID) | RESPIRATORY_TRACT | Status: DC
  Administered 2011-09-20 – 2011-09-21 (×5): 0.5 mg via RESPIRATORY_TRACT
  Filled 2011-09-20 (×5): qty 2.5

## 2011-09-20 MED ORDER — ALBUTEROL SULFATE (5 MG/ML) 0.5% IN NEBU
2.5000 mg | INHALATION_SOLUTION | Freq: Three times a day (TID) | RESPIRATORY_TRACT | Status: DC
  Administered 2011-09-20 – 2011-09-21 (×5): 2.5 mg via RESPIRATORY_TRACT
  Filled 2011-09-20 (×5): qty 0.5

## 2011-09-20 MED ORDER — ENOXAPARIN SODIUM 30 MG/0.3ML ~~LOC~~ SOLN
30.0000 mg | Freq: Every day | SUBCUTANEOUS | Status: DC
  Administered 2011-09-21: 30 mg via SUBCUTANEOUS
  Filled 2011-09-20: qty 0.3

## 2011-09-20 MED ORDER — ALBUTEROL SULFATE (5 MG/ML) 0.5% IN NEBU: 2.5000 mg | INHALATION_SOLUTION | RESPIRATORY_TRACT | Status: DC

## 2011-09-20 NOTE — ED Provider Notes (Signed)
Medical screening examination/treatment/procedure(s) were performed by non-physician practitioner and as supervising physician I was immediately available for consultation/collaboration.   Ariv Penrod W. Marlean Mortell, MD 09/20/11 1253 
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
## 2011-09-20 NOTE — Progress Notes (Signed)
Patient provider:  Please address code status of this patient ASAP.  Thank you.

## 2011-09-20 NOTE — Progress Notes (Signed)
[id number] 

## 2011-09-20 NOTE — Progress Notes (Signed)
Op poc reviewed.  Discussed fluid restrictions and skin care.  Rec'd report from D. Evans,rn.  Consent signed.

## 2011-09-20 NOTE — Progress Notes (Signed)
NAME:  [name], [name] NO.:  [id number]  MEDICAL RECORD NO.:  [id number]  LOCATION:                                 FACILITY:  PHYSICIAN:  Melvyn Novas, MDDATE OF BIRTH:  03-Sep-1976  DATE OF PROCEDURE: DATE OF DISCHARGE:                                PROGRESS NOTE   The patient in with a right lower lobe infiltrate, accelerated hypertension, end-stage renal disease, hyperlipidemia, COPD, currently on vancomycin and Zosyn.  Blood pressure somewhat elevated today, 166/102, temperature 97.6, pulse is 73 and regular, respiratory rate 18, O2 sat 100%.  NEW LABS:  Potassium 4.9, magnesium 1.9, hemoglobin dropped from 10 to 9, could be dilutional.  D-dimer 0.48.  MRSA was negative.  The patient will undergo dialysis today.  PHYSICAL EXAMINATION:  NECK:  No JVD.  No carotid bruits. LUNGS:  Diminished breath sounds at the bases, prolonged respiratory phase, scattered rhonchi bilaterally.  No wheeze audible. HEART:  Regular rhythm.  No S3, S4, gallops, 1/6 aortic outflow murmur. ABDOMEN:  Essentially benign.  Plan right now is to continue vancomycin and Zosyn, continue DuoNeb, continue multiple antihypertensives, watch renal function, continue dialysis for volume depletion, and will make further recommendations as the database expands.     Melvyn Novas, MD     RMD/MEDQ  D:  09/20/2011  T:  09/20/2011  Job:  [id number]

## 2011-09-21 LAB — CBC
MCHC: 32.4 g/dL (ref 30.0–36.0)
RDW: 17.2 % — ABNORMAL HIGH (ref 11.5–15.5)

## 2011-09-21 LAB — DIFFERENTIAL
Basophils Relative: 0 % (ref 0–1)
Eosinophils Absolute: 0.4 10*3/uL (ref 0.0–0.7)
Monocytes Absolute: 0.4 10*3/uL (ref 0.1–1.0)
Monocytes Relative: 7 % (ref 3–12)
Neutrophils Relative %: 63 % (ref 43–77)

## 2011-09-21 LAB — BASIC METABOLIC PANEL
BUN: 22 mg/dL (ref 6–23)
Calcium: 9.8 mg/dL (ref 8.4–10.5)
Creatinine, Ser: 5.46 mg/dL — ABNORMAL HIGH (ref 0.50–1.35)
GFR calc Af Amer: 14 mL/min — ABNORMAL LOW (ref >90)
GFR calc non Af Amer: 12 mL/min — ABNORMAL LOW (ref >90)
Potassium: 4.3 mEq/L (ref 3.5–5.1)

## 2011-09-21 MED ORDER — AZITHROMYCIN 250 MG PO TABS: 250.0000 mg | ORAL_TABLET | Freq: Every day | ORAL | Status: AC

## 2011-09-21 NOTE — Progress Notes (Signed)
NAME:  DEMARIOUS, KAPUR NO.:  [id number]  MEDICAL RECORD NO.:  [id number]  LOCATION:                                 FACILITY:  PHYSICIAN:  Melvyn Novas, MDDATE OF BIRTH:  [birth year]  DATE OF PROCEDURE: DATE OF DISCHARGE:                                PROGRESS NOTE   The patient had hemodialysis yesterday, still has accelerated hypertension on 5 different antihypertensive agents with some compliance.  He is in the hospital for 2-1/2 days.  He also has a questionable right lower lobe infiltrate, some perihilar infiltrates consistent with volume overload.  There is no dyspnea.  No chest pain. Blood pressure this morning 172/110, temperature 97.7, pulse is 80 and regular, respiratory rate is 18, O2 sat is 95% on room air.  NEW LABS:  Hemoglobin stable at 9.1, potassium 4.3, creatinine 5.4, which is chronic.  PLAN:  Right now is to continue hemodialysis for volume extraction depletion.  Monitor signs of CHF.  Continue vancomycin and Zosyn.  We will increase clonidine from 0.2 to 0.3 mg p.o. b.i.d.  Monitor hemodynamics and continue DuoNeb nebulizer and aggressive pulmonary toilet.     Melvyn Novas, MD     RMD/MEDQ  D:  09/21/2011  T:  09/21/2011  Job:  [id number]

## 2011-09-21 NOTE — Discharge Summary (Signed)
NAME:  [name], [name] NO.:  [id number]  MEDICAL RECORD NO.:  [id number]  LOCATION:                                 FACILITY:  PHYSICIAN:  Melvyn Novas, MDDATE OF BIRTH:  1976/06/18  DATE OF ADMISSION: DATE OF DISCHARGE:  LH                              DISCHARGE SUMMARY   The patient is a 35 year old white male with multiple medical problems admitted with questionable right lower lobe infiltrate, congestive heart failure, has a history of ischemic cardiomyopathy, two-vessel disease with 40% ejection fraction, hyperlipidemia, end-stage renal disease on dialysis, accelerated hypertension, chronic noncompliance, schizophrenia, bipolar disorder, personality disorder, COPD, continued tobacco abuse despite repeated attempts counseling, insomnia.  The patient was admitted, myocardial infarction was ruled out on the basis of enzymes.  He had accelerated hypertension in the 220/105 range blood pressure.  Reinstitution of multiple antihypertensive medicines and prompt dialysis within 24 hours gave volume depletion and one normalization of blood pressure.  He had several antihypertensives increased while in the hospital.  The patient continued to do well.  He was placed on vancomycin and Zosyn.  He had no constitutional symptoms of pneumonia.  His other concomitant problems were chronic anemia due to chronic renal failure and the patient left AMA several times to smoke cigarettes.  Essentially he improved.  Blood pressure on the day of discharge was 132/82.  He had two episodes of dialysis, will have dialysis tomorrow morning as an outpatient.  His new discharge medicines included: 1. Amlodipine 10 mg p.o. daily. 2. Aspirin 81 mg p.o. daily. 3. B complex. 4. Folic acid. 5. Nephro-Vite 20 mg tabs one daily. 6. Carvedilol 12.5 p.o. b.i.d. 7. Clonidine 0.2 mg p.o. b.i.d. 8. Plavix 75 mg p.o. daily. 9. Dexilant 60 mg p.o. daily. 10.Cymbalta 60 mg p.o.  daily. 11.Hydralazine 50 mg p.o. t.i.d. 12.Atarax 25 mg p.o. q.6 h. p.r.n. anxiety. 13.Labetalol 200 mg 2 tablets p.o. b.i.d. 14.Lisinopril 20 mg p.o. daily. 15.Zyprexa 10 mg p.o. b.i.d. 16.Requip 1 mg p.o. nightly. 17.Crestor 20 mg p.o. daily. 18.Renvela 800 mg before meals t.i.d. 19.Ambien 10 mg nightly.  The patient was instructed to take all antihypertensives as well as continue Zithromax as an outpatient 250 mg p.o. daily for an additional 4 days and he will follow up in the office in 4 days' time.     Melvyn Novas, MD     RMD/MEDQ  D:  09/21/2011  T:  09/21/2011  Job:  [id number]

## 2011-09-21 NOTE — Progress Notes (Signed)
[id number] 

## 2011-09-21 NOTE — Discharge Summary (Signed)
[id number] 

## 2011-09-21 NOTE — Progress Notes (Signed)
[name]  MRN: [id number]  DOB/AGE: Feb 02, 1976 35 y.o.  Primary Care Physician:DONDIEGO,RICHARD M, MD  Admit date: 09/18/2011  Chief Complaint: Chief Complaint  Patient presents with  . Shortness of Breath    S-Pt presented on  09/18/2011 with c/o of Dyspnea. Pt diagnosed with HCAP. Pt was last dialyzed yesterday.    Pt today feels better.pt offers NO new complaints.      Marland Kitchen albuterol  2.5 mg Nebulization TID   And  . ipratropium  0.5 mg Nebulization TID  . amLODipine  10 mg Oral Daily  . aspirin  81 mg Oral Daily  . b complex-vitamin c-folic acid  1 tablet Oral QHS  . carvedilol  12.5 mg Oral BID WC  . cloNIDine  0.2 mg Oral BID  . clopidogrel  75 mg Oral Q breakfast  . DULoxetine  60 mg Oral Daily  . enoxaparin (LOVENOX) injection  30 mg Subcutaneous Daily  . famotidine  20 mg Oral BID  . hydrALAZINE  50 mg Oral Q8H  . hydrOXYzine  25 mg Oral BID  . labetalol  400 mg Oral BID  . lisinopril  20 mg Oral Daily  . loratadine  10 mg Oral Daily  . OLANZapine  10 mg Oral BID  . pantoprazole  40 mg Oral Q1200  . piperacillin-tazobactam (ZOSYN)  IV  2.25 g Intravenous Q8H  . rOPINIRole  1 mg Oral Daily  . rosuvastatin  20 mg Oral q1800  . sevelamer  2,400 mg Oral TID WC  . vancomycin  1,000 mg Intravenous Q T,Th,Sa-HD  . zolpidem  10 mg Oral QHS  . DISCONTD: enoxaparin (LOVENOX) injection  30 mg Subcutaneous Q12H         WUJ:WJXBJ from the symptoms mentioned above,there are no other symptoms referable to all systems reviewed.  Physical Exam: Vital signs in last 24 hours: Temp:  [97.4 F (36.3 C)-98.2 F (36.8 C)] 97.7 F (36.5 C) (10/03 0641) Pulse Rate:  [66-90] 80  (10/03 0641) Resp:  [18-20] 18  (10/03 0641) BP: (158-176)/(96-120) 172/110 mmHg (10/03 0641) SpO2:  [95 %-100 %] 98 % (10/03 0857) Weight:  [181 lb 14.1 oz (82.5 kg)-193 lb 9 oz (87.8 kg)] 181 lb 14.1 oz (82.5 kg) (10/02 1930) Weight change:  Last BM Date: 09/18/11  Intake/Output from  previous day: 10/02 0701 - 10/03 0700 In: 1420 [P.O.:1320] Out: 5200 [Urine:200]     Physical Exam: General- pt is awake,alert, oriented to time place and person Resp- No acute REsp distress, CTA B/L NO Rhonchi CVS- S1S2 regular ij rate and rhythm GIT- BS+, soft, NT, ND EXT- NO LE Edema, Cyanosis   Lab Results: CBC  Basename 09/21/11 0546 09/20/11 0503  WBC 5.1 4.2  HGB 9.1* 9.0*  HCT 28.1* 28.0*  PLT 189 185    BMET  Basename 09/21/11 0546 09/20/11 0503  NA 131* 134*  K 4.3 4.9  CL 92* 96  CO2 29 26  GLUCOSE 113* 94  BUN 22 38*  CREATININE 5.46* 8.35*  CALCIUM 9.8 9.7    MICRO Recent Results (from the past 240 hour(s))  CULTURE, BLOOD (ROUTINE X 2)     Status: Normal (Preliminary result)   Collection Time   09/18/11  6:15 PM      Component Value Range Status Comment   Specimen Description BLOOD LEFT HAND   Final    Special Requests BOTTLES DRAWN AEROBIC AND ANAEROBIC 6CC   Final    Culture NO GROWTH 2 DAYS  Final    Report Status PENDING   Incomplete   CULTURE, BLOOD (ROUTINE X 2)     Status: Normal (Preliminary result)   Collection Time   09/18/11  6:23 PM      Component Value Range Status Comment   Specimen Description BLOOD RIGHT ARM   Final    Special Requests BOTTLES DRAWN AEROBIC AND ANAEROBIC 6CC   Final    Culture NO GROWTH 2 DAYS   Final    Report Status PENDING   Incomplete   MRSA PCR SCREENING     Status: Normal   Collection Time   09/18/11  9:17 PM      Component Value Range Status Comment   MRSA by PCR NEGATIVE  NEGATIVE  Final       Lab Results  Component Value Date   CALCIUM 9.8 09/21/2011   CAION 1.29 09/03/2011   PHOS 5.7* 09/20/2011            Impression: 1)Renal  ESRD on HD                On TTS schedule                Hx of NON complaince with Treatment regimen.                Was last dialyzed yesterday                 NO need of Dialysis today   2)HTN Target Organ damage  CKD CAD Medication- On RAS blockers-  ACE On Calcium Channel Blockers On Alpha and beta Blockers ( Coreg) On Vasodilators- Hydralzine On Central Acting Sympatholytics- Clonidine   3)Anemia HGb at goal (9--11)  4)CKD Mineral-Bone Disorder Secondary Hyperparathyroidism  present . Phosphorus not at goal.   5)ID Admitted with HCAP         On IV vanco + Zosyn  6)FEN  Normokalemic Hyponatremic- secondary to ESRD inability to get rid of free water Euvolemic   7)Acid base Co2 at goal     Plan:   1) Agree with Current treatment and plan. 2) will dialyze in am if pt still here 3) will d/c IVF as pt 4) will check PTH-I, phos, BMet, CBC in am       Kailany Dinunzio S 09/21/2011, 9:24 AM

## 2011-09-23 LAB — CULTURE, BLOOD (ROUTINE X 2)
Culture: NO GROWTH
Culture: NO GROWTH

## 2011-09-28 ENCOUNTER — Other Ambulatory Visit (HOSPITAL_COMMUNITY): Payer: Self-pay | Admitting: *Deleted

## 2011-09-28 LAB — CBC
HCT: 27.4 — ABNORMAL LOW
MCHC: 34.7
Platelets: 216
RBC: 3.63 — ABNORMAL LOW
RDW: 16.8 — ABNORMAL HIGH
WBC: 8.8

## 2011-09-28 LAB — DIFFERENTIAL
Basophils Absolute: 0
Eosinophils Absolute: 0.2
Eosinophils Relative: 2
Eosinophils Relative: 3
Lymphocytes Relative: 34
Lymphs Abs: 1.6
Monocytes Relative: 7
Neutro Abs: 2.7

## 2011-09-28 LAB — RAPID URINE DRUG SCREEN, HOSP PERFORMED
Amphetamines: NOT DETECTED
Benzodiazepines: POSITIVE — AB

## 2011-09-28 LAB — BLOOD GAS, ARTERIAL
Acid-base deficit: 10 — ABNORMAL HIGH
FIO2: 0.21
O2 Content: 2
O2 Saturation: 96.6
Patient temperature: 37
Patient temperature: 37
pCO2 arterial: 30.9 — ABNORMAL LOW

## 2011-09-28 LAB — COMPREHENSIVE METABOLIC PANEL
ALT: 9
AST: 10
Alkaline Phosphatase: 125 — ABNORMAL HIGH
CO2: 15 — ABNORMAL LOW
Calcium: 7.8 — ABNORMAL LOW
GFR calc Af Amer: 10 — ABNORMAL LOW
GFR calc non Af Amer: 8 — ABNORMAL LOW
Potassium: 4.3
Sodium: 128 — ABNORMAL LOW

## 2011-09-28 LAB — ACETAMINOPHEN LEVEL: Acetaminophen (Tylenol), Serum: <10 — ABNORMAL LOW

## 2011-09-28 LAB — BASIC METABOLIC PANEL
BUN: 49 — ABNORMAL HIGH
Calcium: 8 — ABNORMAL LOW
GFR calc non Af Amer: 9 — ABNORMAL LOW
Glucose, Bld: 88

## 2011-09-28 LAB — ETHANOL: Alcohol, Ethyl (B): <5

## 2011-09-28 IMAGING — CR DG CHEST 1V PORT
1 series · 1 of 1 positions shown · non-contrast
Comparison: 01/15/2010

CLINICAL DATA: Chest pain

PORTABLE CHEST - 1 VIEW

[view not recorded]
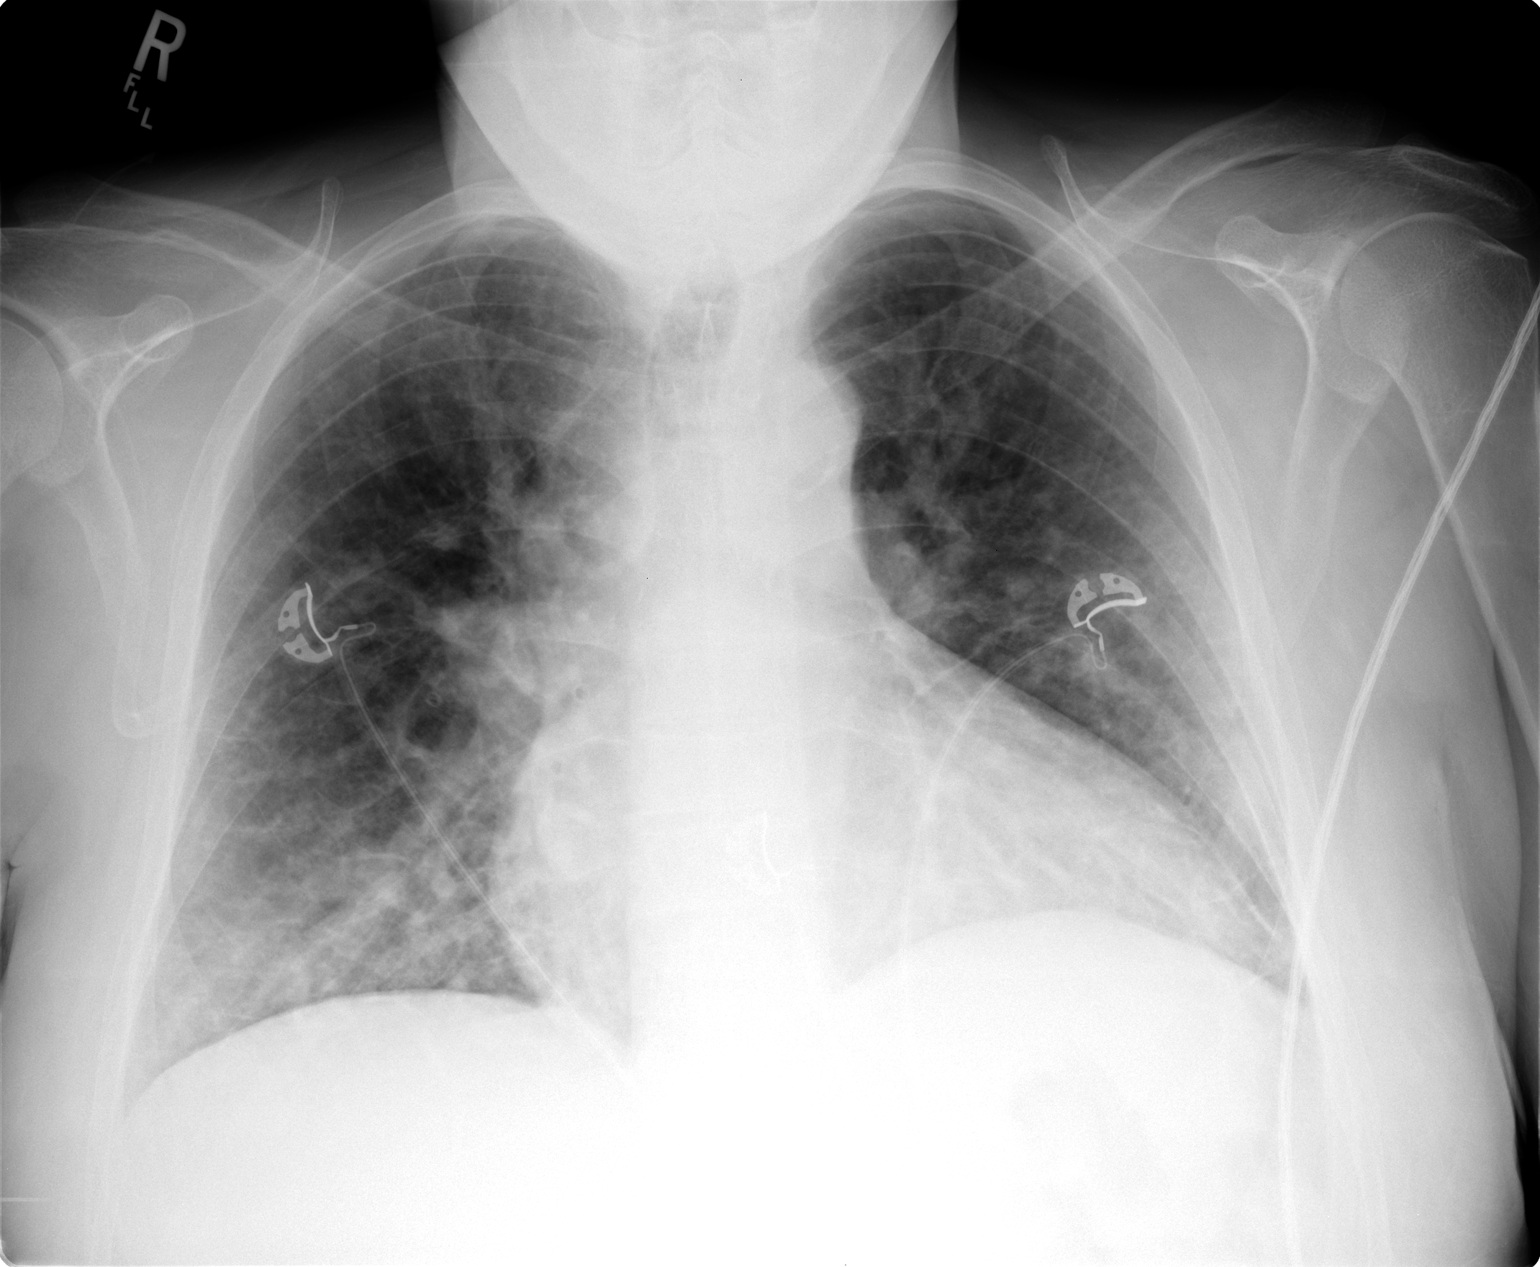

[1 of 1 positions shown; findings below may reference images not displayed]

FINDINGS: Cardiomegaly with mild to moderate vascular congestion.
No definite pneumonia or pleural fluid in one-view.
IMPRESSION: Cardiomegaly with vascular congestion.

## 2011-09-30 IMAGING — CT CT ANGIO CHEST
1 of 6 series · 5 of 36 positions shown · IV contrast (Omnipaque 300)
Comparison: None

CLINICAL DATA: Chest pain, smoker, end-stage renal disease on
dialysis, prior cardiac stenting

CT ANGIOGRAPHY CHEST WITH CONTRAST
TECHNIQUE: Multidetector CT imaging of the chest was performed
using the standard protocol during bolus administration of
intravenous contrast.  Multiplanar CT image reconstructions
including MIPs were obtained to evaluate the vascular anatomy.
Breast shield utilized.
Contrast:  80 ml Dmnipaque-3BB IV

[Series 5: pe 3.0 b40f · axial · 0.70mm/px · z∈[-274,-94]mm · 5 of 90 slices shown]
[im 15/90  lung]
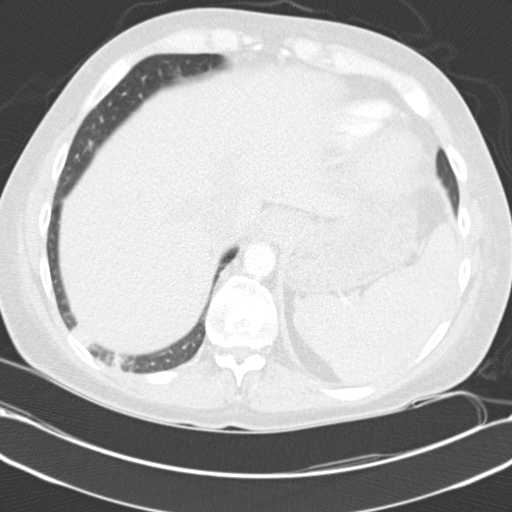
[im 30/90  mediastinal]
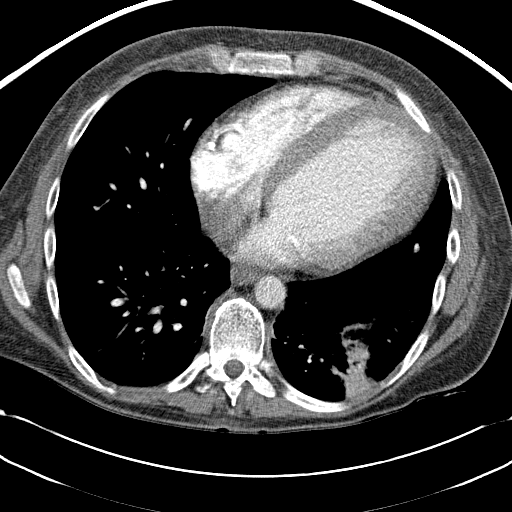
[im 45/90  lung]
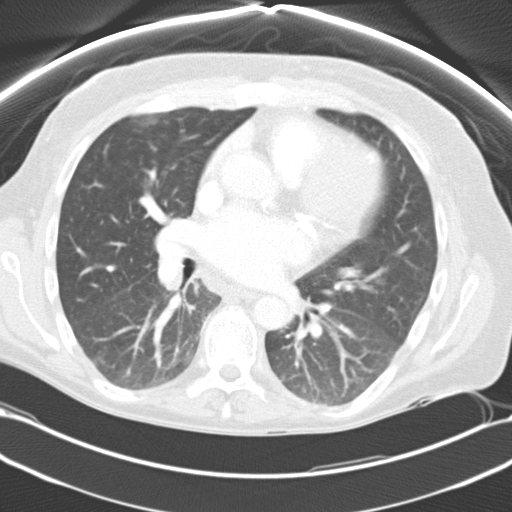
[im 60/90  mediastinal]
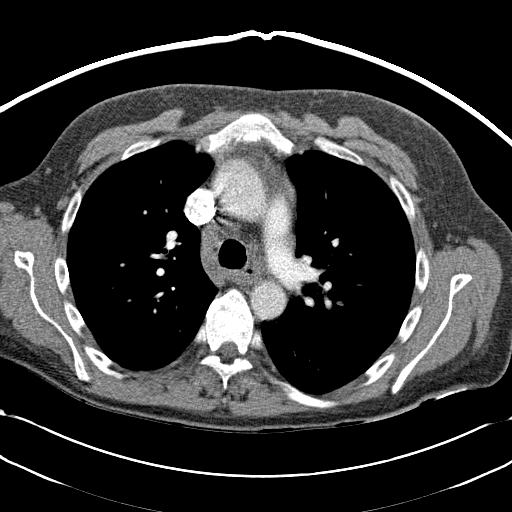
[im 75/90  lung]
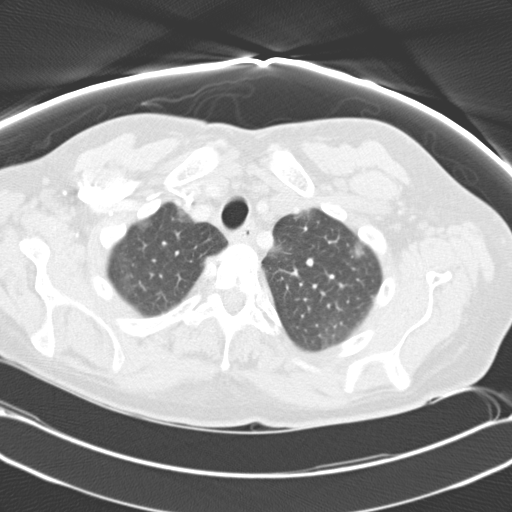

[5 of 36 positions shown; findings below may reference images not displayed]

FINDINGS: Atherosclerotic calcifications normal caliber thoracic aorta.
Extensive coronary arterial calcifications with probable circumflex
artery stents.
Minimal pericardial fluid.
Heart appears enlarged, multichamber.
Pulmonary arteries patent.
No evidence pulmonary embolism.
Scattered mildly enlarged hilar and mediastinal lymph nodes,
nonspecific.
Enlarged subcarinal node 2.7 x 1.6 cm image 40.
Enlarged right hilar nodes 1.9 x 1.9 cm image 40 and 2.7 x 1.8 cm
image 51.
Minimally enlarged left hilar node 1.4 x 1.1 cm image 38.
Density in anterior mediastinum may reflect residual thymic tissue.

Question of hepatic and splenic enlargement identified, though
these extend beyond extent of thoracic imaging.
Few celiac axis nodes identified.
Subsegmental atelectasis bilateral lower lobes.
Minimal patchy infiltrates in the upper lobes and right middle
lobe.
No definite endobronchial lesions or acute bony abnormalities.
Severe multilevel thoracic spine endplate irregularities and
minimal end plate spur formation.

Review of the MIP images confirms the above findings.
IMPRESSION: No evidence of pulmonary embolism.
Atherosclerotic disease and coronary artery disease.
Cardiac enlargement.
Nonspecific minimally enlarged mediastinal and hilar lymph nodes,
uncertain etiology, consider follow-up CT imaging in three 4 months
to assess stability.
Subsegmental atelectasis bilateral lower lobes with mild scattered
patchy pulmonary infiltrates in the upper and right middle lobes.
Question hepatosplenomegaly, though these organs extend beyond CT
imaging extent.

## 2011-11-04 ENCOUNTER — Encounter: Payer: Self-pay | Admitting: Cardiology

## 2011-11-07 IMAGING — CR DG CHEST 2V
2 series · 2 of 2 positions shown · non-contrast
Comparison: CT chest 03/17/2010.  Chest x-ray 03/15/2010.

CLINICAL DATA: Dialysis patient.  Shortness of breath and chest
pain.

CHEST - 2 VIEW

[view not recorded (1 of 2)]
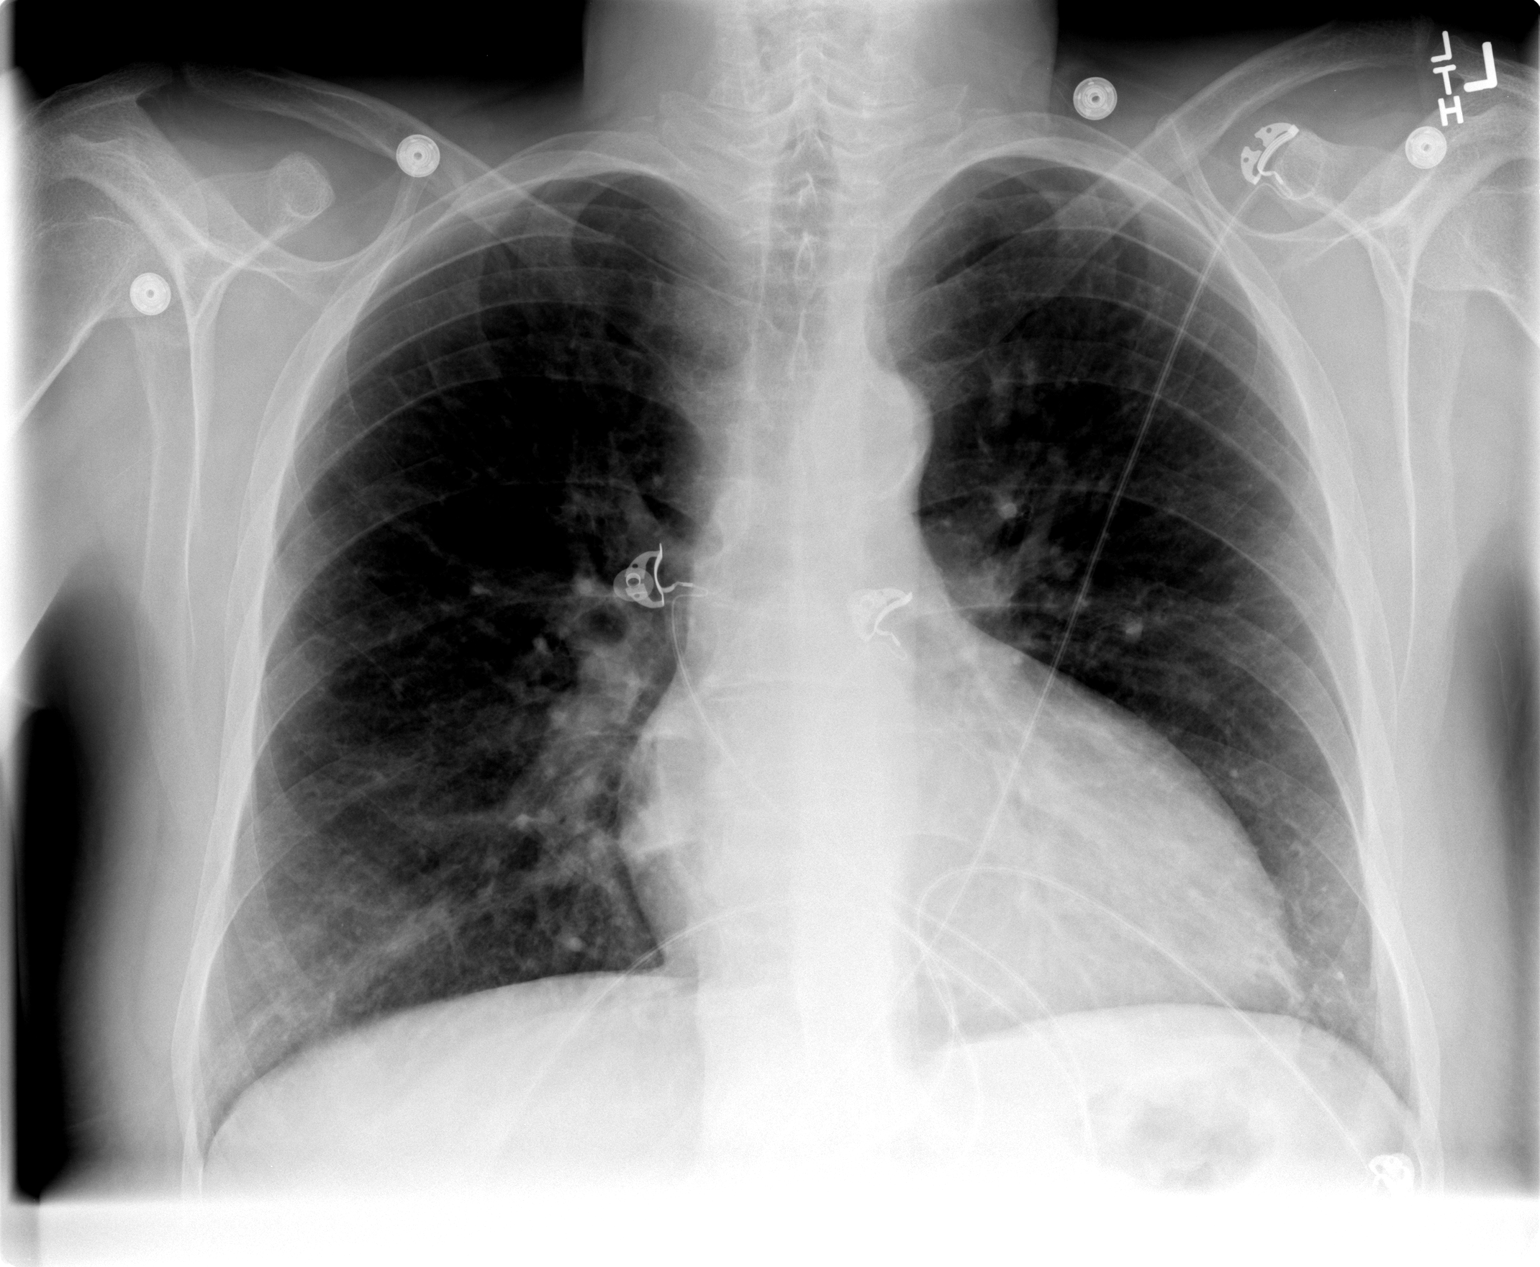

[view not recorded (2 of 2)]
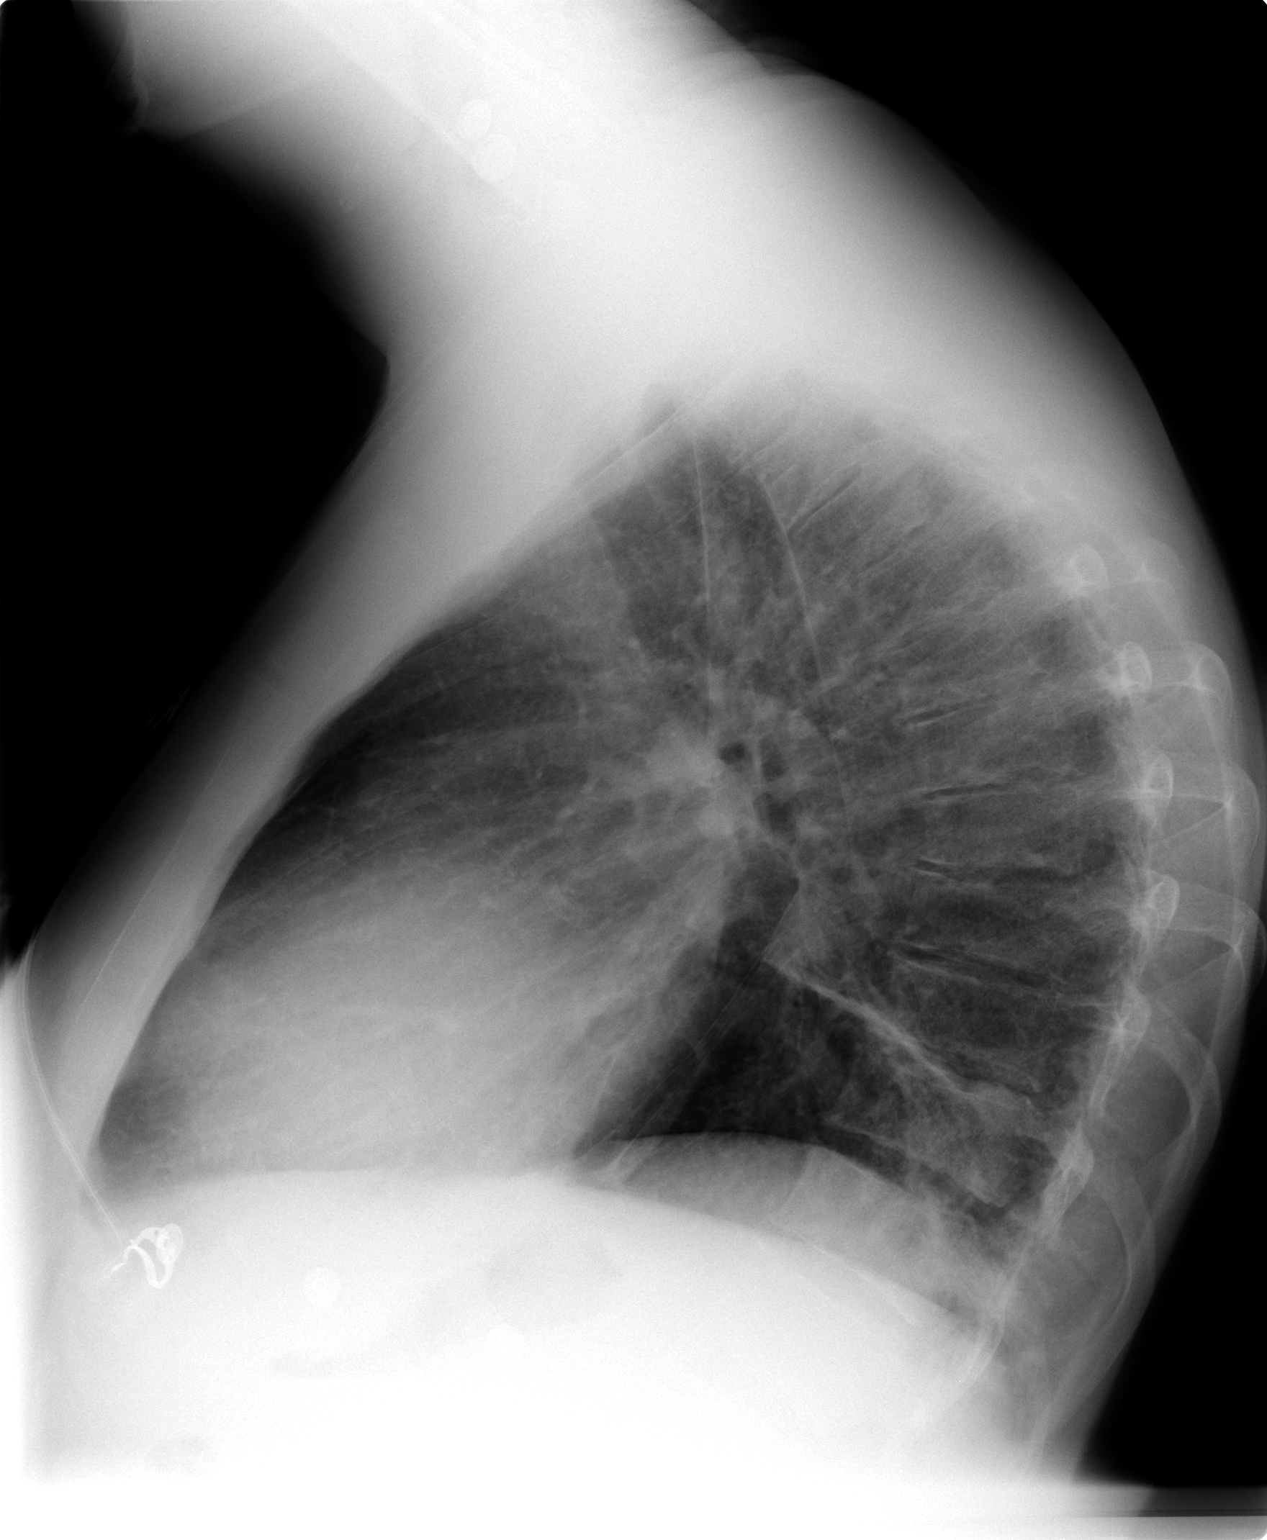

[2 of 2 positions shown; findings below may reference images not displayed]

FINDINGS: Cardiomegaly persists with calcified tortuous aorta.
Mild vascular congestion in the bases.  No infiltrate or failure.
No effusion or pneumothorax.  Bones unremarkable.  Improved
aeration compared with priors.
IMPRESSION: Cardiomegaly, with mild vascular congestion.  No overt infiltrate
or failure.

## 2011-11-13 IMAGING — CR DG CHEST 1V PORT
1 series · 1 of 1 positions shown · non-contrast
Comparison: None.

CLINICAL DATA: Chest pain and shortness of breath.

PORTABLE CHEST - 1 VIEW

[view not recorded]
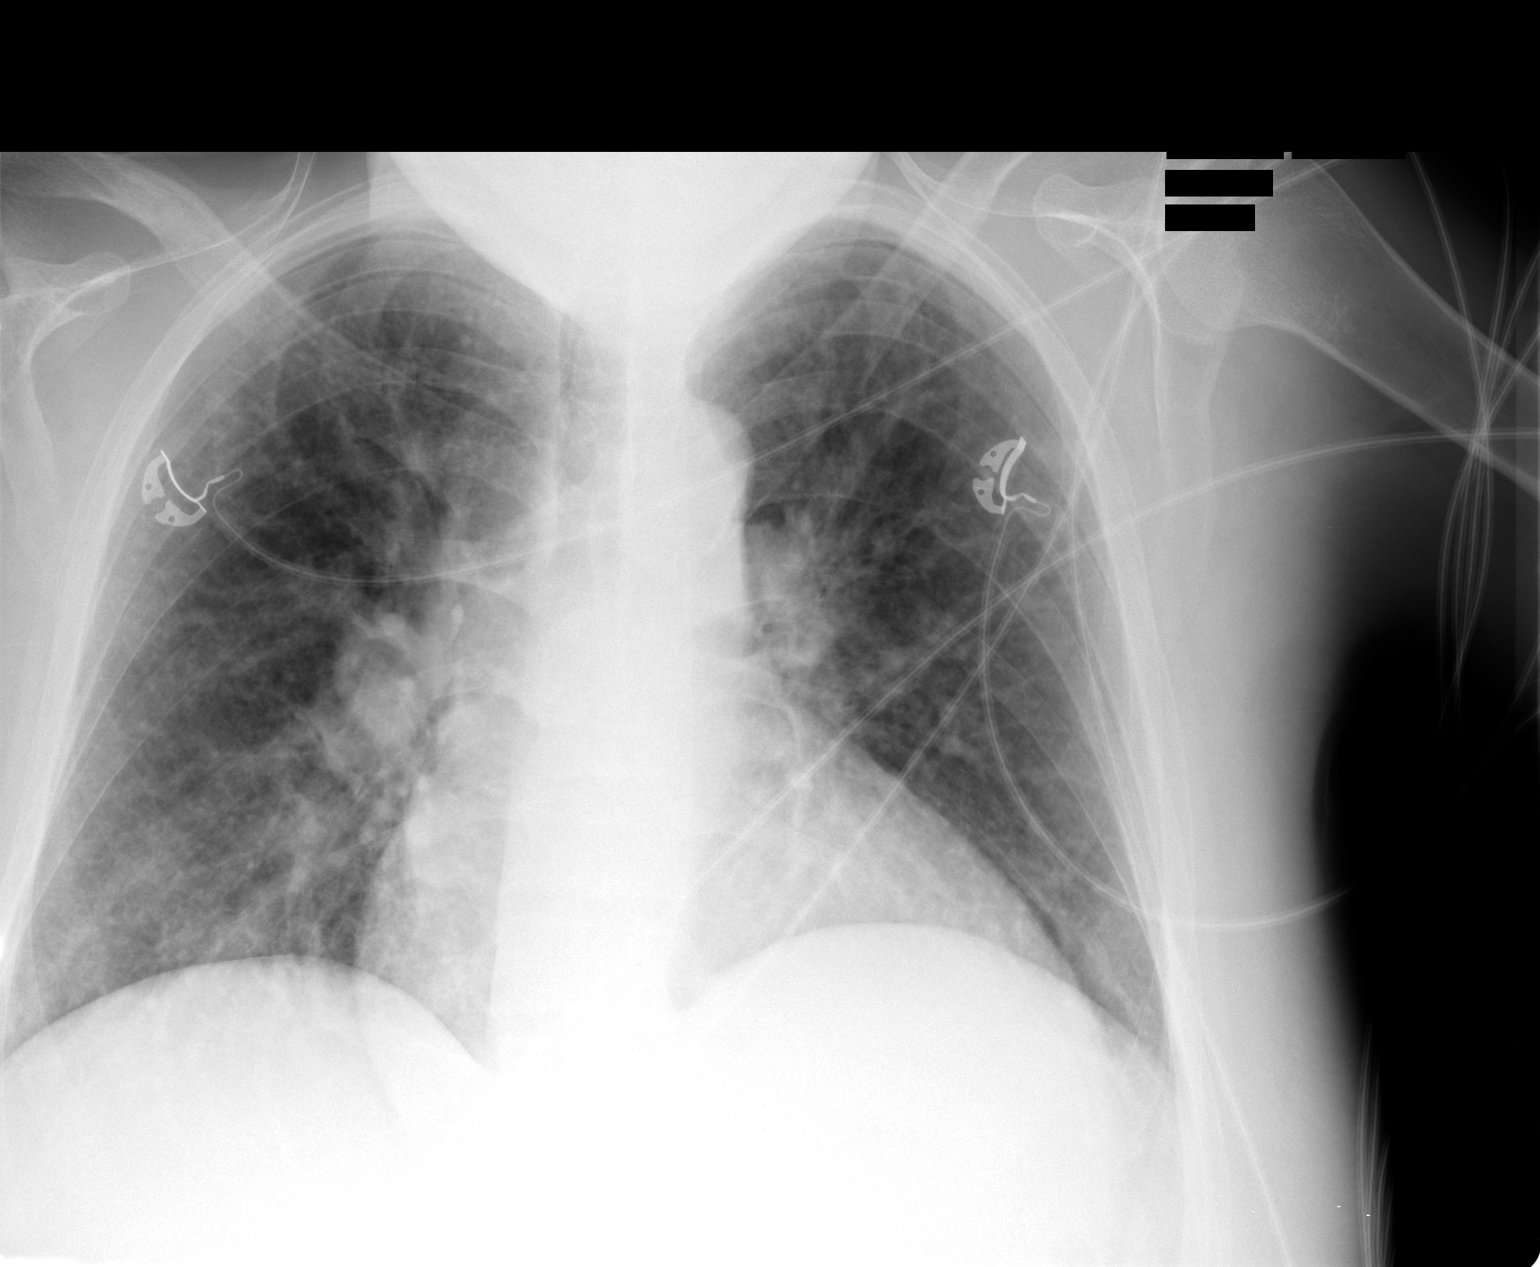

[1 of 1 positions shown; findings below may reference images not displayed]

FINDINGS: Cardiomegaly.  Pulmonary vascular congestion.  Suspicion
for mild perivascular edema.
IMPRESSION: Findings consistent with mild CHF.

## 2011-11-14 ENCOUNTER — Encounter (HOSPITAL_COMMUNITY): Payer: Self-pay

## 2011-11-14 ENCOUNTER — Emergency Department (HOSPITAL_COMMUNITY)
Admission: EM | Admit: 2011-11-14 | Discharge: 2011-11-14 | Disposition: A | Discharge status: Home / Self Care | Payer: Medicare Other | Attending: Emergency Medicine | Admitting: Emergency Medicine

## 2011-11-14 DIAGNOSIS — M549 Dorsalgia, unspecified: Secondary | ICD-10-CM

## 2011-11-14 DIAGNOSIS — IMO0002 Reserved for concepts with insufficient information to code with codable children: Secondary | ICD-10-CM | POA: Insufficient documentation

## 2011-11-14 DIAGNOSIS — Z79899 Other long term (current) drug therapy: Secondary | ICD-10-CM | POA: Insufficient documentation

## 2011-11-14 DIAGNOSIS — M545 Low back pain, unspecified: Secondary | ICD-10-CM | POA: Insufficient documentation

## 2011-11-14 DIAGNOSIS — Y92009 Unspecified place in unspecified non-institutional (private) residence as the place of occurrence of the external cause: Secondary | ICD-10-CM | POA: Insufficient documentation

## 2011-11-14 DIAGNOSIS — Z7982 Long term (current) use of aspirin: Secondary | ICD-10-CM | POA: Insufficient documentation

## 2011-11-14 DIAGNOSIS — I10 Essential (primary) hypertension: Secondary | ICD-10-CM | POA: Insufficient documentation

## 2011-11-14 DIAGNOSIS — W010XXA Fall on same level from slipping, tripping and stumbling without subsequent striking against object, initial encounter: Secondary | ICD-10-CM | POA: Insufficient documentation

## 2011-11-14 MED ORDER — HYDROCODONE-ACETAMINOPHEN 5-325 MG PO TABS
1.0000 | ORAL_TABLET | Freq: Once | ORAL | Status: AC
  Administered 2011-11-14: 1 via ORAL
  Filled 2011-11-14: qty 1

## 2011-11-14 MED ORDER — HYDROMORPHONE HCL PF 1 MG/ML IJ SOLN
1.0000 mg | Freq: Once | INTRAMUSCULAR | Status: AC
  Administered 2011-11-14: 1 mg via INTRAMUSCULAR
  Filled 2011-11-14: qty 1

## 2011-11-14 MED ORDER — DIAZEPAM 5 MG PO TABS
5.0000 mg | ORAL_TABLET | Freq: Once | ORAL | Status: AC
  Administered 2011-11-14: 5 mg via ORAL
  Filled 2011-11-14: qty 1
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
## 2011-11-14 NOTE — ED Notes (Signed)
Pt c/o r lower back pain since last night.  Denies injury.  REports history of chronic lower back pain.

## 2011-11-17 ENCOUNTER — Ambulatory Visit (INDEPENDENT_AMBULATORY_CARE_PROVIDER_SITE_OTHER): Payer: Medicare Other | Admitting: Cardiology

## 2011-11-17 ENCOUNTER — Encounter: Payer: Self-pay | Admitting: Cardiology

## 2011-11-17 DIAGNOSIS — F209 Schizophrenia, unspecified: Secondary | ICD-10-CM | POA: Insufficient documentation

## 2011-11-17 DIAGNOSIS — D649 Anemia, unspecified: Secondary | ICD-10-CM | POA: Insufficient documentation

## 2011-11-17 DIAGNOSIS — F319 Bipolar disorder, unspecified: Secondary | ICD-10-CM | POA: Insufficient documentation

## 2011-11-17 DIAGNOSIS — R079 Chest pain, unspecified: Secondary | ICD-10-CM

## 2011-11-17 DIAGNOSIS — I2589 Other forms of chronic ischemic heart disease: Secondary | ICD-10-CM

## 2011-11-17 DIAGNOSIS — I255 Ischemic cardiomyopathy: Secondary | ICD-10-CM

## 2011-11-17 DIAGNOSIS — G8929 Other chronic pain: Secondary | ICD-10-CM

## 2011-11-17 DIAGNOSIS — N186 End stage renal disease: Secondary | ICD-10-CM

## 2011-11-17 DIAGNOSIS — I10 Essential (primary) hypertension: Secondary | ICD-10-CM

## 2011-11-17 DIAGNOSIS — J449 Chronic obstructive pulmonary disease, unspecified: Secondary | ICD-10-CM

## 2011-11-17 NOTE — Patient Instructions (Signed)
Your physician recommends that you schedule a follow-up appointment in: 6 months   Your physician has recommended you make the following change in your medication:    STOP Plavix  

## 2011-11-17 NOTE — Progress Notes (Signed)
Patient ID: [name], male   DOB: Mar 16, 1976, 35 y.o.   MRN: [id number] HPI: [name] is seen in the [place] office for the first time, but previously has been under the care of Dr. Antoine Poche in our [place] office.  This unfortunate gentleman has end-stage renal disease and ischemic cardiomyopathy, having required 2 vessel stent placement in 2010.  He was found to have mildly impaired left ventricular systolic function at that time and recently was admitted to hospital with congestive heart failure and possible pneumonia.  Myocardial infarction was ruled out with only one troponin value being slightly elevated, but BNP was greater than 70,000.  He improved with daily dialysis.  Since discharge, he denies all symptoms.  He experiences no orthopnea, PND, dyspnea on exertion or chest discomfort, but has had substantial pedal edema.  He attributes this to the dialysis center being closed on Thanksgiving, and is currently receiving more frequent dialysis and fluid removal.  He did not take his medication this morning, as he defers this until after dialysis to prevent removal of medication from his blood.  He reports normal blood pressure when assessed at his dialysis center.  Prior to Admission medications   Medication Sig Start Date End Date Taking? Authorizing Provider  amLODipine (NORVASC) 10 MG tablet Take 1 tablet (10 mg total) by mouth daily. 07/19/11 07/18/12 Yes Richard M Dondiego  aspirin EC 81 MG tablet Take 81 mg by mouth daily.     Yes Historical Provider, MD  b complex-vitamin c-folic acid (NEPHRO-VITE) 0.8 MG TABS Take 0.8 mg by mouth at bedtime.    Yes Historical Provider, MD  carvedilol (COREG) 12.5 MG tablet Take 1 tablet (12.5 mg total) by mouth 2 (two) times daily with a meal. 08/08/11 08/07/12 Yes Richard M Dondiego  cloNIDine (CATAPRES) 0.2 MG tablet Take 1 tablet (0.2 mg total) by mouth 2 (two) times daily. 07/26/11 07/25/12 Yes Richard M Dondiego  clopidogrel (PLAVIX) 75 MG tablet Take  75 mg by mouth daily.    Yes Historical Provider, MD  dexlansoprazole (DEXILANT) 60 MG capsule Take 60 mg by mouth daily.     Yes Historical Provider, MD  DULoxetine (CYMBALTA) 60 MG capsule Take 60 mg by mouth daily.     Yes Historical Provider, MD  hydrALAZINE (APRESOLINE) 50 MG tablet Take 50 mg by mouth 3 (three) times daily.     Yes Historical Provider, MD  hydrOXYzine (ATARAX) 25 MG tablet Take 25 mg by mouth 2 (two) times daily.     Yes Historical Provider, MD  labetalol (NORMODYNE) 200 MG tablet Take 400 mg by mouth 2 (two) times daily.    Yes Historical Provider, MD  lisinopril (PRINIVIL,ZESTRIL) 20 MG tablet Take 20 mg by mouth 2 (two) times daily.    Yes Historical Provider, MD  OLANZapine (ZYPREXA) 10 MG tablet Take 10 mg by mouth 2 (two) times daily.    Yes Historical Provider, MD  rOPINIRole (REQUIP) 1 MG tablet Take 1 mg by mouth every morning. Patient states he does not take this at bedtime   Yes Historical Provider, MD  rosuvastatin (CRESTOR) 20 MG tablet Take 20 mg by mouth daily.    Yes Historical Provider, MD  sevelamer (RENVELA) 800 MG tablet Take 2,400-4,000 mg by mouth 3 (three) times daily with meals. TAKE 5 TABLETS WITH MEALS AND 3 TABLETS WITH SNACKS    Yes Historical Provider, MD  zolpidem (AMBIEN) 10 MG tablet Take 10 mg by mouth at bedtime as needed. FOR SLEEP  Yes Historical Provider, MD    Allergies  Allergen Reactions  . Methadone Anaphylaxis  . Simvastatin Hives and Swelling  . Fentanyl Rash  . Ibuprofen Swelling and Rash  . Ketorolac Tromethamine Other (See Comments)    unknown  . Naproxen Rash  . Tramadol Hcl Rash   Past medical history, social history, and family history reviewed and updated.  ROS: See history of present illness.  PHYSICAL EXAM: BP 177/100  Pulse 75  Ht 5\' 8"  (1.727 m)  Wt 83.952 kg (185 lb 1.3 oz)  BMI 28.14 kg/m2  General-Well developed; no acute distress Body habitus-proportionate weight and height Neck-No JVD; no carotid  bruits Lungs-clear lung fields; resonant to percussion Cardiovascular-normal PMI; normal S1 and S2 Abdomen-normal bowel sounds; soft and non-tender without masses or organomegaly Musculoskeletal-No deformities, no cyanosis or clubbing Neurologic-Normal cranial nerves; symmetric strength and tone Skin-Warm, no significant lesions Extremities-distal pulses intact; no edema  ASSESSMENT AND PLAN:  [place] Bing, MD 11/17/2011 11:48 AM

## 2011-11-18 NOTE — Assessment & Plan Note (Signed)
Symptoms are fairly well controlled at present.  Tobacco cessation is highly desirable, but patient indicates no willingness to seriously attempt to quit.

## 2011-11-18 NOTE — Assessment & Plan Note (Signed)
Moderate anemia, which will be managed by patient's nephrologist.  He might benefit from an ESA.

## 2011-11-18 NOTE — Assessment & Plan Note (Addendum)
Management of fluid status in this gentleman with end-stage renal disease is dependent upon adequate dialysis.  Current medical therapy is optimized.  Patient has taken clopidogrel chronically, which is likely not providing much benefit at present.  This medication will be discontinued and his dose of aspirin decreased to 81 mg per day.

## 2011-11-18 NOTE — Assessment & Plan Note (Signed)
Symptoms have resolved for the time being.  No additional testing is recommended.

## 2011-11-18 NOTE — Assessment & Plan Note (Addendum)
Hypertension is not well-controlled today, but patient did not take his medications This morning.  Management of this problem would best be undertaken in conjunction with his dialysis visits.

## 2011-11-18 NOTE — Assessment & Plan Note (Signed)
Patient is above his dry weight and is receiving extra dialysis to bring his fluid status back to normal.

## 2011-11-19 ENCOUNTER — Encounter: Payer: Self-pay | Admitting: Cardiology

## 2011-12-03 ENCOUNTER — Emergency Department (HOSPITAL_COMMUNITY): Payer: Medicare Other

## 2011-12-03 ENCOUNTER — Encounter (HOSPITAL_COMMUNITY): Payer: Self-pay | Admitting: Emergency Medicine

## 2011-12-03 ENCOUNTER — Emergency Department (HOSPITAL_COMMUNITY)
Admission: EM | Admit: 2011-12-03 | Discharge: 2011-12-03 | Disposition: A | Discharge status: Home / Self Care | Payer: Medicare Other | Attending: Emergency Medicine | Admitting: Emergency Medicine

## 2011-12-03 DIAGNOSIS — M549 Dorsalgia, unspecified: Secondary | ICD-10-CM

## 2011-12-03 DIAGNOSIS — M545 Low back pain, unspecified: Secondary | ICD-10-CM | POA: Insufficient documentation

## 2011-12-03 DIAGNOSIS — R609 Edema, unspecified: Secondary | ICD-10-CM | POA: Insufficient documentation

## 2011-12-03 DIAGNOSIS — Z87828 Personal history of other (healed) physical injury and trauma: Secondary | ICD-10-CM | POA: Insufficient documentation

## 2011-12-03 DIAGNOSIS — F319 Bipolar disorder, unspecified: Secondary | ICD-10-CM | POA: Insufficient documentation

## 2011-12-03 DIAGNOSIS — J449 Chronic obstructive pulmonary disease, unspecified: Secondary | ICD-10-CM | POA: Insufficient documentation

## 2011-12-03 DIAGNOSIS — J4489 Other specified chronic obstructive pulmonary disease: Secondary | ICD-10-CM | POA: Insufficient documentation

## 2011-12-03 DIAGNOSIS — I2589 Other forms of chronic ischemic heart disease: Secondary | ICD-10-CM | POA: Insufficient documentation

## 2011-12-03 DIAGNOSIS — G894 Chronic pain syndrome: Secondary | ICD-10-CM | POA: Insufficient documentation

## 2011-12-03 DIAGNOSIS — I120 Hypertensive chronic kidney disease with stage 5 chronic kidney disease or end stage renal disease: Secondary | ICD-10-CM | POA: Insufficient documentation

## 2011-12-03 DIAGNOSIS — M25562 Pain in left knee: Secondary | ICD-10-CM

## 2011-12-03 DIAGNOSIS — Z9861 Coronary angioplasty status: Secondary | ICD-10-CM | POA: Insufficient documentation

## 2011-12-03 DIAGNOSIS — Z992 Dependence on renal dialysis: Secondary | ICD-10-CM | POA: Insufficient documentation

## 2011-12-03 DIAGNOSIS — N186 End stage renal disease: Secondary | ICD-10-CM | POA: Insufficient documentation

## 2011-12-03 DIAGNOSIS — F17200 Nicotine dependence, unspecified, uncomplicated: Secondary | ICD-10-CM | POA: Insufficient documentation

## 2011-12-03 DIAGNOSIS — M25569 Pain in unspecified knee: Secondary | ICD-10-CM | POA: Insufficient documentation

## 2011-12-03 DIAGNOSIS — W19XXXA Unspecified fall, initial encounter: Secondary | ICD-10-CM | POA: Insufficient documentation

## 2011-12-03 DIAGNOSIS — F209 Schizophrenia, unspecified: Secondary | ICD-10-CM | POA: Insufficient documentation

## 2011-12-03 MED ORDER — HYDROMORPHONE HCL PF 1 MG/ML IJ SOLN
1.0000 mg | Freq: Once | INTRAMUSCULAR | Status: AC
  Administered 2011-12-03: 1 mg via INTRAMUSCULAR
  Filled 2011-12-03: qty 1

## 2011-12-03 NOTE — ED Provider Notes (Signed)
Medical screening examination/treatment/procedure(s) were performed by non-physician practitioner and as supervising physician I was immediately available for consultation/collaboration.   Laray Anger, DO 12/03/11 1902

## 2011-12-03 NOTE — ED Notes (Signed)
Pt a/ox4. Resp even and unlabored. NAD at this time. D/C instructions reviewed with pt. Pt verbalized understanding. Pt ambulated to lobby with steady gate.  

## 2011-12-03 NOTE — ED Notes (Signed)
Pt is under pain contract with Dr. Webb Laws, [facility].

## 2011-12-03 NOTE — ED Notes (Signed)
Pt states he fell about 4 days ago and since them c/o left knee pain and lower back pain.

## 2011-12-03 NOTE — ED Notes (Signed)
Pt presents with left knee pain and low back pain. Pt states he fell a couple of days ago. NAD at this time. No bruising, swelling or deformity to spine or knee. Pt bilateral legs swollen but is normal for pt. Pt ambulated to treatment room with steady gate.
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
## 2011-12-13 ENCOUNTER — Emergency Department (HOSPITAL_COMMUNITY)
Admission: EM | Admit: 2011-12-13 | Discharge: 2011-12-13 | Disposition: A | Discharge status: Home / Self Care | Payer: Medicare Other | Attending: Emergency Medicine | Admitting: Emergency Medicine

## 2011-12-13 ENCOUNTER — Encounter (HOSPITAL_COMMUNITY): Payer: Self-pay | Admitting: *Deleted

## 2011-12-13 DIAGNOSIS — Z7982 Long term (current) use of aspirin: Secondary | ICD-10-CM | POA: Insufficient documentation

## 2011-12-13 DIAGNOSIS — J449 Chronic obstructive pulmonary disease, unspecified: Secondary | ICD-10-CM | POA: Insufficient documentation

## 2011-12-13 DIAGNOSIS — Z992 Dependence on renal dialysis: Secondary | ICD-10-CM | POA: Insufficient documentation

## 2011-12-13 DIAGNOSIS — M533 Sacrococcygeal disorders, not elsewhere classified: Secondary | ICD-10-CM | POA: Insufficient documentation

## 2011-12-13 DIAGNOSIS — Z79899 Other long term (current) drug therapy: Secondary | ICD-10-CM | POA: Insufficient documentation

## 2011-12-13 DIAGNOSIS — I120 Hypertensive chronic kidney disease with stage 5 chronic kidney disease or end stage renal disease: Secondary | ICD-10-CM | POA: Insufficient documentation

## 2011-12-13 DIAGNOSIS — M545 Low back pain, unspecified: Secondary | ICD-10-CM | POA: Insufficient documentation

## 2011-12-13 DIAGNOSIS — M79609 Pain in unspecified limb: Secondary | ICD-10-CM | POA: Insufficient documentation

## 2011-12-13 DIAGNOSIS — J4489 Other specified chronic obstructive pulmonary disease: Secondary | ICD-10-CM | POA: Insufficient documentation

## 2011-12-13 DIAGNOSIS — M549 Dorsalgia, unspecified: Secondary | ICD-10-CM

## 2011-12-13 DIAGNOSIS — F17200 Nicotine dependence, unspecified, uncomplicated: Secondary | ICD-10-CM | POA: Insufficient documentation

## 2011-12-13 DIAGNOSIS — F319 Bipolar disorder, unspecified: Secondary | ICD-10-CM | POA: Insufficient documentation

## 2011-12-13 DIAGNOSIS — N186 End stage renal disease: Secondary | ICD-10-CM | POA: Insufficient documentation

## 2011-12-13 MED ORDER — HYDROMORPHONE HCL PF 1 MG/ML IJ SOLN
1.0000 mg | Freq: Once | INTRAMUSCULAR | Status: AC
  Administered 2011-12-13: 1 mg via INTRAMUSCULAR
  Filled 2011-12-13: qty 1
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
## 2011-12-13 NOTE — ED Notes (Signed)
Pt c/o lower back pain that started getting worse yesterday, unsure of any injury,

## 2011-12-15 ENCOUNTER — Emergency Department (HOSPITAL_COMMUNITY)
Admission: EM | Admit: 2011-12-15 | Discharge: 2011-12-15 | Disposition: A | Discharge status: Home / Self Care | Payer: Medicare Other | Attending: Emergency Medicine | Admitting: Emergency Medicine

## 2011-12-15 ENCOUNTER — Encounter (HOSPITAL_COMMUNITY): Payer: Self-pay

## 2011-12-15 DIAGNOSIS — M545 Low back pain, unspecified: Secondary | ICD-10-CM | POA: Insufficient documentation

## 2011-12-15 DIAGNOSIS — Z7982 Long term (current) use of aspirin: Secondary | ICD-10-CM | POA: Insufficient documentation

## 2011-12-15 DIAGNOSIS — Z79899 Other long term (current) drug therapy: Secondary | ICD-10-CM | POA: Insufficient documentation

## 2011-12-15 DIAGNOSIS — M79609 Pain in unspecified limb: Secondary | ICD-10-CM | POA: Insufficient documentation

## 2011-12-15 DIAGNOSIS — G8929 Other chronic pain: Secondary | ICD-10-CM

## 2011-12-15 DIAGNOSIS — N186 End stage renal disease: Secondary | ICD-10-CM | POA: Insufficient documentation

## 2011-12-15 DIAGNOSIS — F17200 Nicotine dependence, unspecified, uncomplicated: Secondary | ICD-10-CM | POA: Insufficient documentation

## 2011-12-15 NOTE — ED Provider Notes (Signed)
History     CSN: [id number]  Arrival date & time 12/15/11  1241   First MD Initiated Contact with Patient 12/15/11 1435      Chief Complaint  Patient presents with  . Back Pain     Patient is a 35 y.o. male presenting with back pain. The history is provided by the patient.  Back Pain  This is a chronic problem. The current episode started more than 2 days ago. The problem occurs constantly. The problem has been gradually worsening. The pain is associated with no known injury. The pain is present in the lumbar spine. The pain radiates to the right thigh. The pain is moderate. The symptoms are aggravated by bending and twisting. The pain is the same all the time. Pertinent negatives include no fever, no abdominal pain, no bowel incontinence, no paresis and no weakness.  pt reports he have "twisted" it wrong No falls No focal weakness He had full dialysis today Reports his chronic pain specialist advised him to go to the ED  PMH - ESRD  Past Surgical History  Procedure Date  . Esophagogastroduodenoscopy 7/11    four-quadrant distal esophageal erosion,consistent with erosive reflux,small hiatal herina,antral and bulbar  otherwise nl  . Coronary angioplasty with stent placement   . Av fistula placement     Left arm    No family history on file.  History  Substance Use Topics  . Smoking status: Current Everyday Smoker -- 1.0 packs/day for 15 years  . Smokeless tobacco: Not on file  . Alcohol Use: No      Review of Systems  Constitutional: Negative for fever.  Gastrointestinal: Negative for abdominal pain and bowel incontinence.  Musculoskeletal: Positive for back pain.  Neurological: Negative for weakness.    Allergies  Methadone; Simvastatin; Fentanyl; Ibuprofen; Ketorolac tromethamine; Naproxen; and Tramadol hcl  Home Medications   Current Outpatient Rx  Name Route Sig Dispense Refill  . AMLODIPINE BESYLATE 10 MG PO TABS Oral Take 1 tablet (10 mg total) by  mouth daily. 30 tablet 3  . ASPIRIN EC 81 MG PO TBEC Oral Take 81 mg by mouth daily.      Marland Kitchen NEPHRO-VITE 0.8 MG PO TABS Oral Take 0.8 mg by mouth at bedtime.     Marland Kitchen CARVEDILOL 12.5 MG PO TABS Oral Take 1 tablet (12.5 mg total) by mouth 2 (two) times daily with a meal. 60 tablet 3  . CLONIDINE HCL 0.2 MG PO TABS Oral Take 1 tablet (0.2 mg total) by mouth 2 (two) times daily. 60 tablet 3  . DEXLANSOPRAZOLE 60 MG PO CPDR Oral Take 60 mg by mouth daily.      . DULOXETINE HCL 60 MG PO CPEP Oral Take 60 mg by mouth daily.      Marland Kitchen HYDRALAZINE HCL 50 MG PO TABS Oral Take 50 mg by mouth 3 (three) times daily.      Marland Kitchen HYDROXYZINE HCL 25 MG PO TABS Oral Take 25 mg by mouth 2 (two) times daily.      Marland Kitchen LABETALOL HCL 200 MG PO TABS Oral Take 400 mg by mouth 2 (two) times daily.     Marland Kitchen LISINOPRIL 20 MG PO TABS Oral Take 20 mg by mouth 2 (two) times daily.     Marland Kitchen OLANZAPINE 10 MG PO TABS Oral Take 10 mg by mouth 2 (two) times daily.     . OXYCODONE-ACETAMINOPHEN 5-325 MG PO TABS Oral Take 1 tablet by mouth every 4 (four) hours as needed.  For pain     . ROPINIROLE HCL 1 MG PO TABS Oral Take 1 mg by mouth every morning. Patient states he does not take this at bedtime    . ROSUVASTATIN CALCIUM 20 MG PO TABS Oral Take 20 mg by mouth daily.     Marland Kitchen SEVELAMER CARBONATE 800 MG PO TABS Oral Take 2,400-4,000 mg by mouth 3 (three) times daily with meals. TAKE 5 TABLETS WITH MEALS AND 3 TABLETS WITH SNACKS     . ZOLPIDEM TARTRATE 10 MG PO TABS Oral Take 10 mg by mouth at bedtime as needed. FOR SLEEP       BP 138/83  Pulse 73  Temp(Src) 97.9 F (36.6 C) (Oral)  Resp 18  Ht 5\' 8"  (1.727 m)  Wt 185 lb (83.915 kg)  BMI 28.13 kg/m2  SpO2 98%  Physical Exam  CONSTITUTIONAL: Well developed/well nourished HEAD AND FACE: Normocephalic/atraumatic ENMT: Mucous membranes moist NECK: supple no meningeal signs SPINE:entire spine nontender, lumbar paraspinal tenderness, No bruising/crepitance/stepoffs noted to spine CV:  no  murmurs/rubs/gallops noted LUNGS: Lungs are clear to auscultation bilaterally, no apparent distress ABDOMEN: soft, nontender, no rebound or guarding GU:no cva tenderness NEURO: Pt is awake/alert, moves all extremitiesx4  equal distal motor: hip flexion/knee flexion/extension, ankle dorsi/plantar flexion, great toe extension intact bilaterally,no apparent sensory deficit in any dermatome.  Pt is able to ambulate. EXTREMITIES: pulses normal, full ROM Dialysis access to left UE- +thrill noted SKIN: warm PSYCH: no abnormalities of mood noted   ED Course  Procedures    1. Chronic pain       MDM  Nursing notes reviewed and considered in documentation Previous records reviewed and considered   No neuro deficits, this is chronic pain advised f/u as outpatient  Of note - original PMH showed h/o AAA but recent imaging results reviewed and no mention of AAA       Joya Gaskins, MD 12/15/11 [phone]

## 2011-12-15 NOTE — ED Notes (Signed)
Pt reports chronic lower back pain, says pain flared 4 days ago.

## 2011-12-18 NOTE — ED Provider Notes (Signed)
Medical screening examination/treatment/procedure(s) were performed by non-physician practitioner and as supervising physician I was immediately available for consultation/collaboration. Jorja Empie, MD, FACEP   Itzelle Gains L Shanessa Hodak, MD 12/18/11 1001 

## 2011-12-22 ENCOUNTER — Emergency Department (HOSPITAL_COMMUNITY)
Admission: EM | Admit: 2011-12-22 | Discharge: 2011-12-22 | Disposition: A | Discharge status: Home / Self Care | Payer: Medicare Other | Attending: Emergency Medicine | Admitting: Emergency Medicine

## 2011-12-22 ENCOUNTER — Encounter (HOSPITAL_COMMUNITY): Payer: Self-pay

## 2011-12-22 DIAGNOSIS — S39012A Strain of muscle, fascia and tendon of lower back, initial encounter: Secondary | ICD-10-CM

## 2011-12-22 DIAGNOSIS — N186 End stage renal disease: Secondary | ICD-10-CM | POA: Insufficient documentation

## 2011-12-22 DIAGNOSIS — X500XXA Overexertion from strenuous movement or load, initial encounter: Secondary | ICD-10-CM | POA: Insufficient documentation

## 2011-12-22 DIAGNOSIS — M545 Low back pain, unspecified: Secondary | ICD-10-CM | POA: Insufficient documentation

## 2011-12-22 DIAGNOSIS — Z7982 Long term (current) use of aspirin: Secondary | ICD-10-CM | POA: Insufficient documentation

## 2011-12-22 DIAGNOSIS — Z992 Dependence on renal dialysis: Secondary | ICD-10-CM | POA: Insufficient documentation

## 2011-12-22 DIAGNOSIS — G894 Chronic pain syndrome: Secondary | ICD-10-CM | POA: Insufficient documentation

## 2011-12-22 DIAGNOSIS — F17200 Nicotine dependence, unspecified, uncomplicated: Secondary | ICD-10-CM | POA: Insufficient documentation

## 2011-12-22 DIAGNOSIS — F319 Bipolar disorder, unspecified: Secondary | ICD-10-CM | POA: Insufficient documentation

## 2011-12-22 DIAGNOSIS — I120 Hypertensive chronic kidney disease with stage 5 chronic kidney disease or end stage renal disease: Secondary | ICD-10-CM | POA: Insufficient documentation

## 2011-12-22 DIAGNOSIS — S335XXA Sprain of ligaments of lumbar spine, initial encounter: Secondary | ICD-10-CM | POA: Insufficient documentation

## 2011-12-22 DIAGNOSIS — Z79899 Other long term (current) drug therapy: Secondary | ICD-10-CM | POA: Insufficient documentation

## 2011-12-22 MED ORDER — DIAZEPAM 5 MG/ML IJ SOLN
5.0000 mg | Freq: Once | INTRAMUSCULAR | Status: AC
  Administered 2011-12-22: 5 mg via INTRAMUSCULAR
  Filled 2011-12-22: qty 2
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
## 2011-12-22 NOTE — ED Notes (Signed)
Pt states he was moving furniture yesterday and hurt his back

## 2011-12-24 NOTE — ED Provider Notes (Signed)
Medical screening examination/treatment/procedure(s) were performed by non-physician practitioner and as supervising physician I was immediately available for consultation/collaboration. Erinne Gillentine, MD, FACEP   Boone Gear L Brigham Cobbins, MD 12/24/11 1238 

## 2011-12-28 IMAGING — CR DG CHEST 1V PORT
1 series · 1 of 1 positions shown · non-contrast
Comparison: 04/30/2010

CLINICAL DATA: Chest pain.

PORTABLE CHEST - 1 VIEW

[view not recorded]
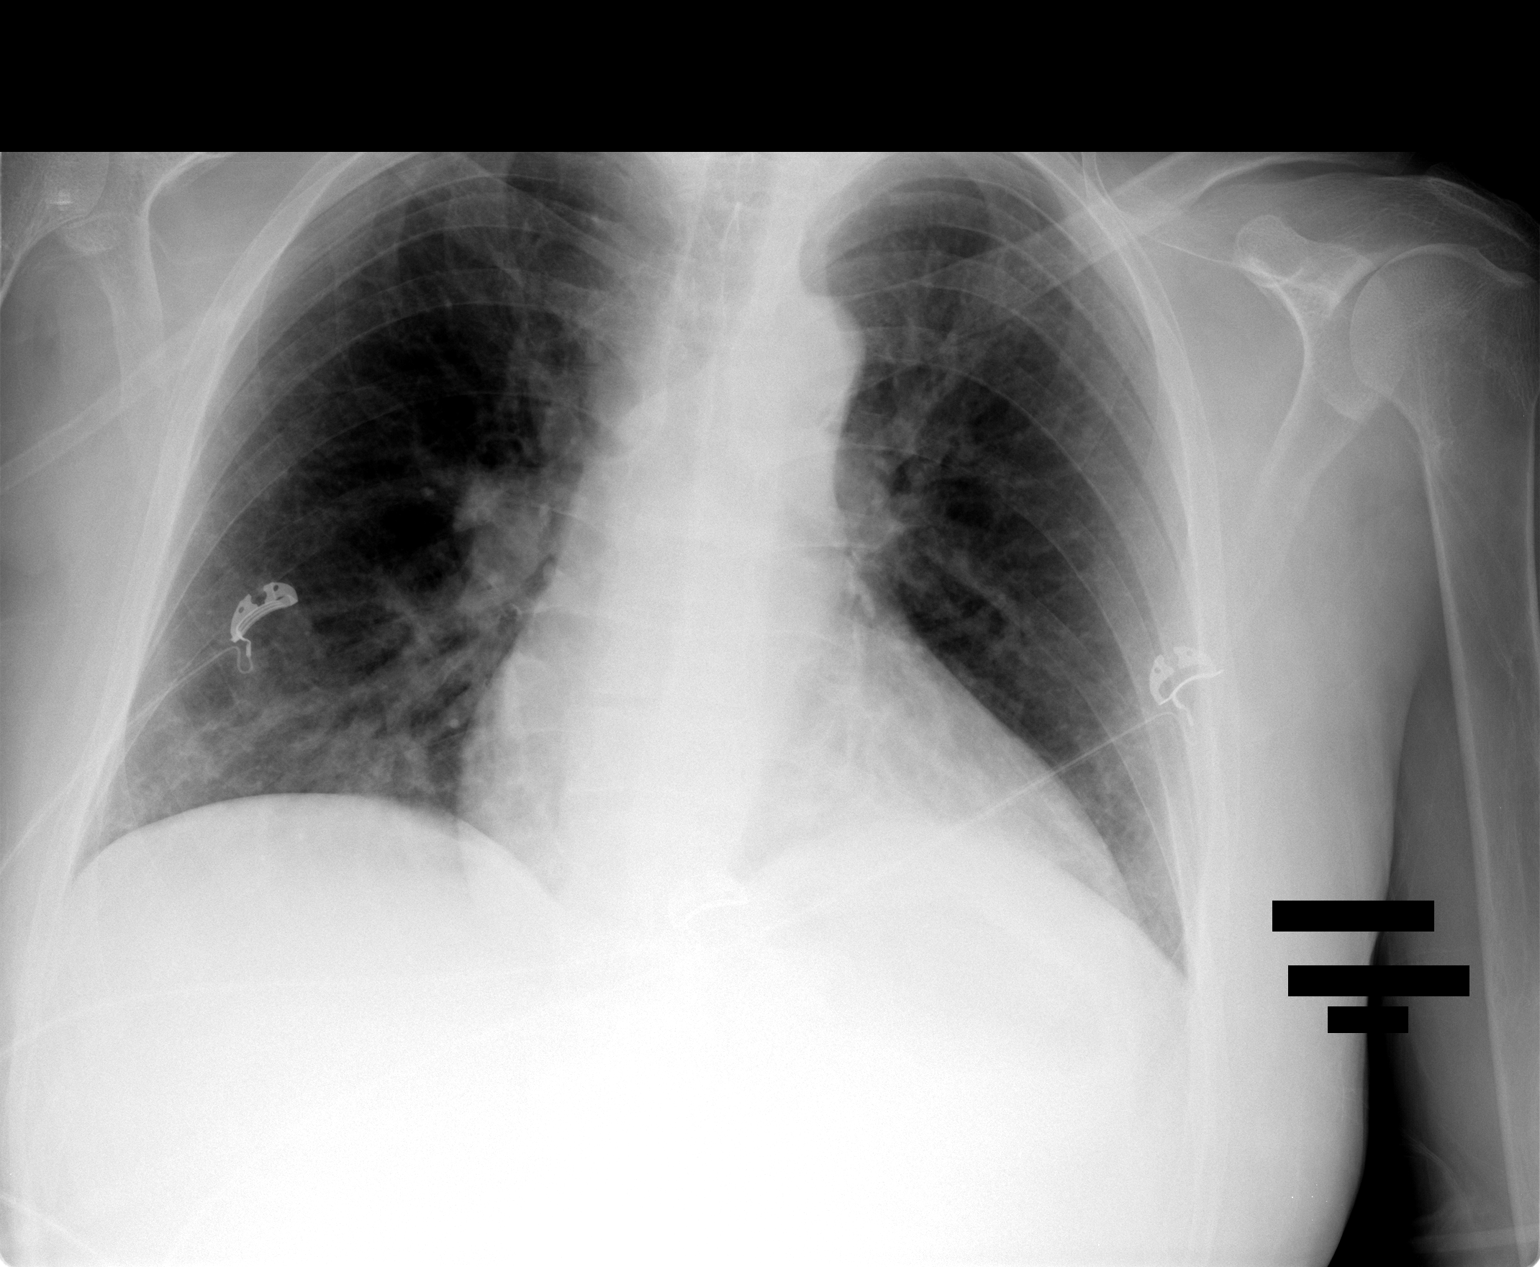

[1 of 1 positions shown; findings below may reference images not displayed]

FINDINGS: There is mild cardiomegaly.  Pulmonary vascularity is
normal and the lungs are clear except for some minimal scarring at
the right lung base.  No bony abnormality.
IMPRESSION: No acute abnormalities.  Mild cardiomegaly.

## 2012-01-02 IMAGING — CR DG CHEST 2V
2 series · 2 of 2 positions shown · non-contrast
Comparison: Portable chest x-ray of 06/14/2010

CLINICAL DATA: Chest pain, palpitations

CHEST - 2 VIEW

[view not recorded (1 of 2)]
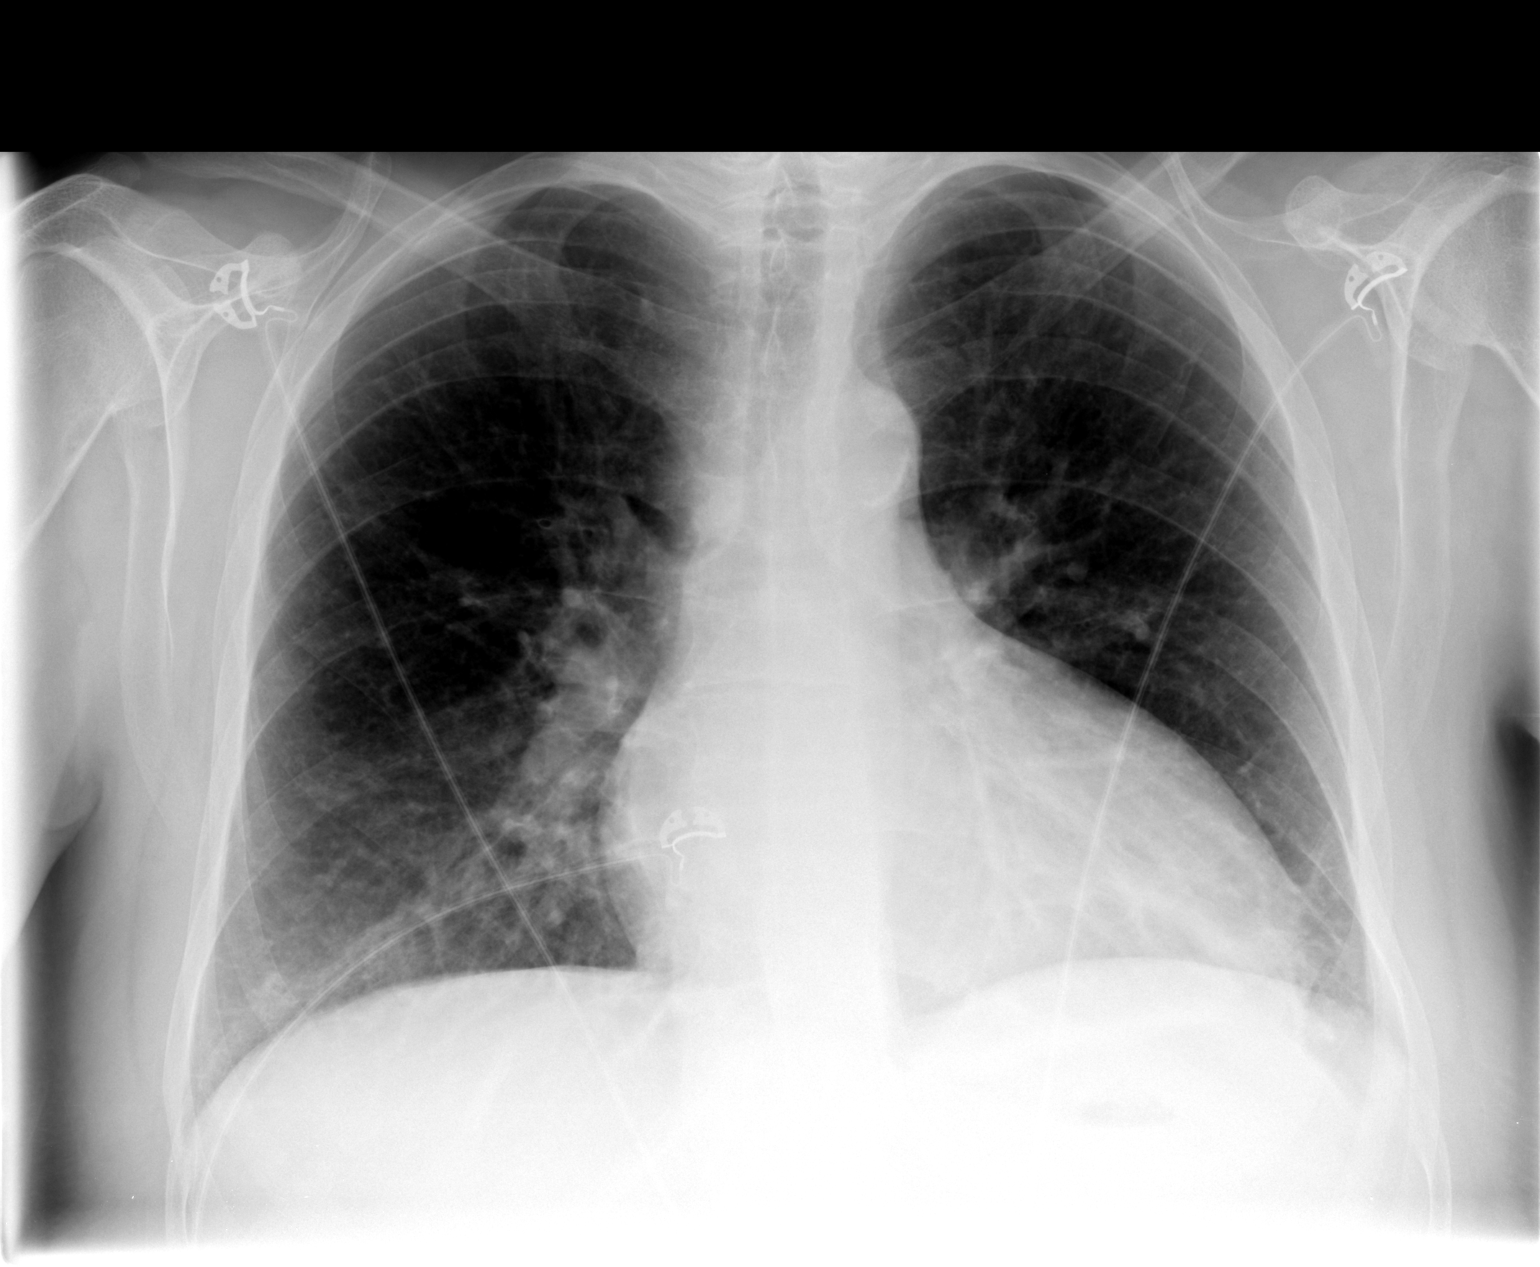

[view not recorded (2 of 2)]
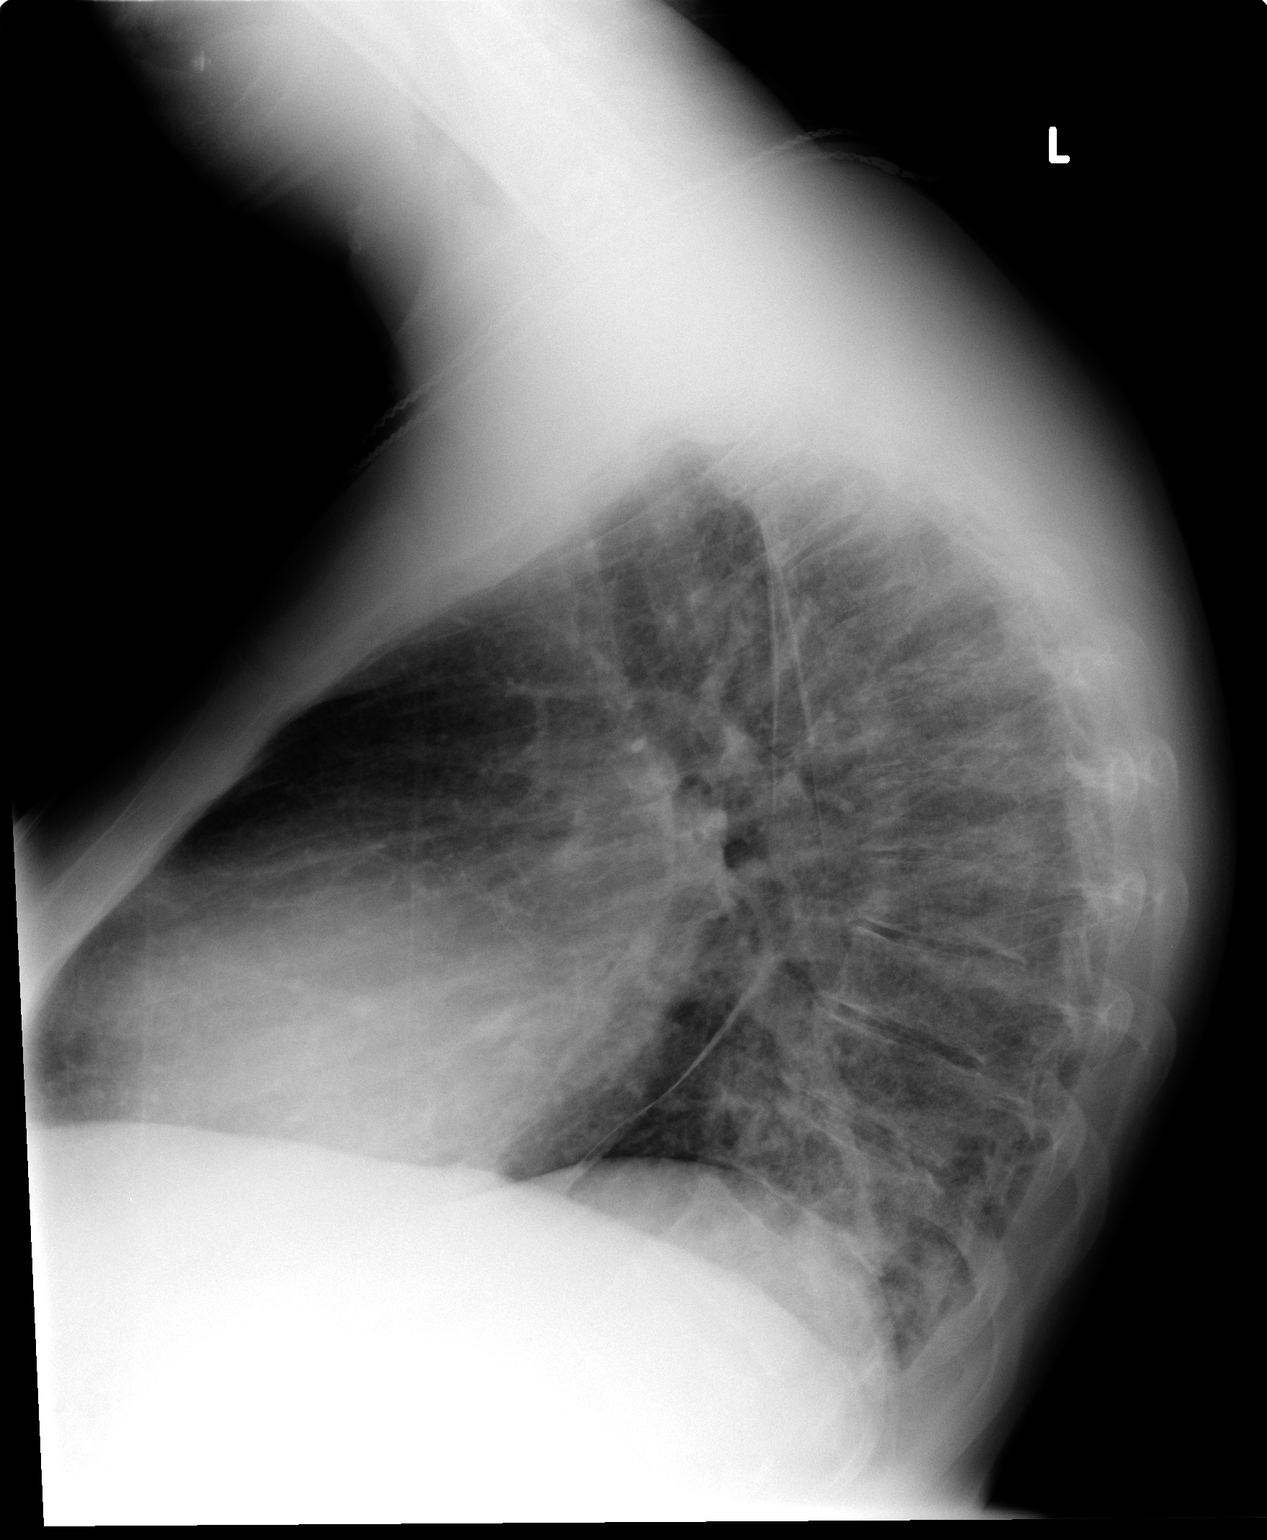

[2 of 2 positions shown; findings below may reference images not displayed]

FINDINGS: Streaky opacity at the left lung base posteriorly most
likely reflects atelectasis.  No definite pneumonia or effusion is
seen.  There is mild cardiomegaly present which is stable.  No
acute bony abnormality is seen.
IMPRESSION: Probable linear atelectasis at the left lung base.  Stable mild
cardiomegaly.

## 2012-01-04 IMAGING — CR DG CHEST 1V PORT
1 series · 1 of 1 positions shown · non-contrast
Comparison: Chest x-ray of 06/19/2010

CLINICAL DATA: Shortness of breath

PORTABLE CHEST - 1 VIEW

[AP]
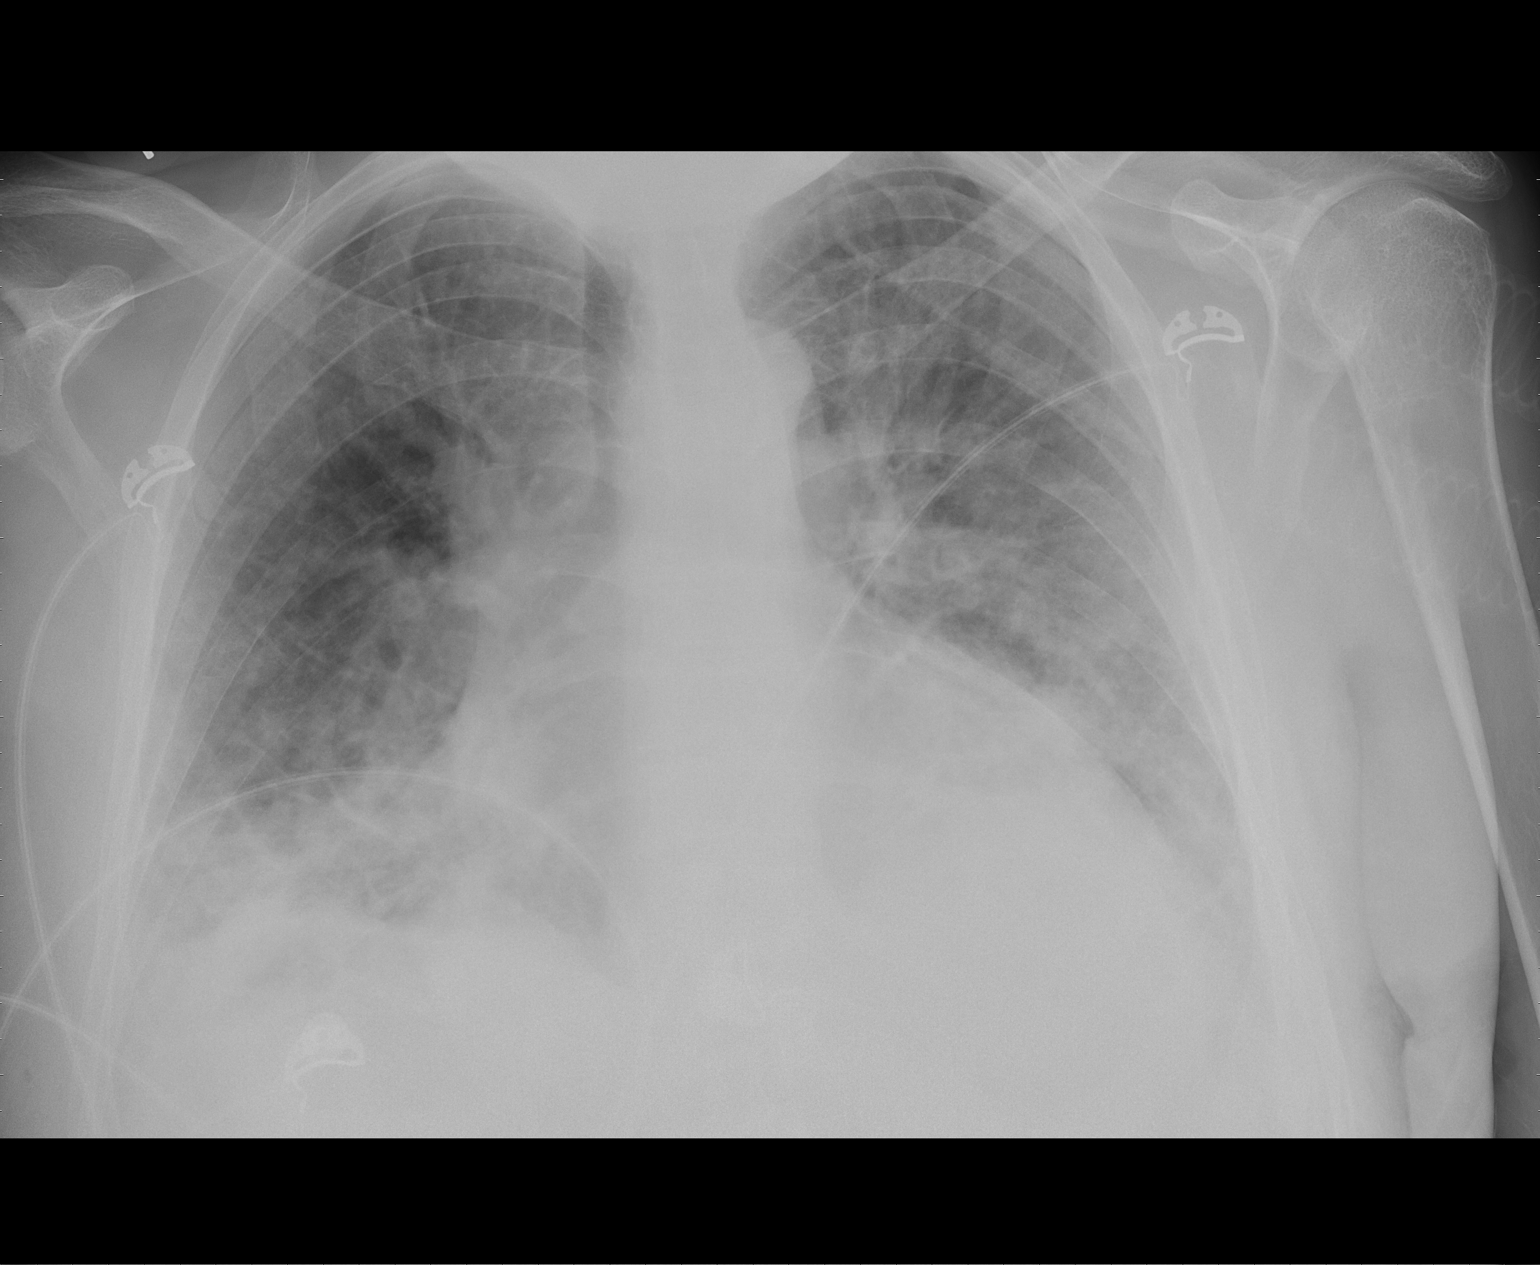

[1 of 1 positions shown; findings below may reference images not displayed]

FINDINGS: There is now airspace disease diffusely throughout the
lungs.  Considerations are that of edema, pneumonia, or ARDS.
Cardiomegaly is stable and there may be small effusions present.
IMPRESSION: Interval development of diffuse airspace disease.  Consider edema,
pneumonia, or ARDS.

## 2012-01-08 IMAGING — CR DG CHEST 1V PORT
1 series · 1 of 1 positions shown · non-contrast
Comparison: 06/21/2010

CLINICAL DATA: Left-sided chest pain.  History of coronary artery
disease.

PORTABLE CHEST - 1 VIEW

[AP]
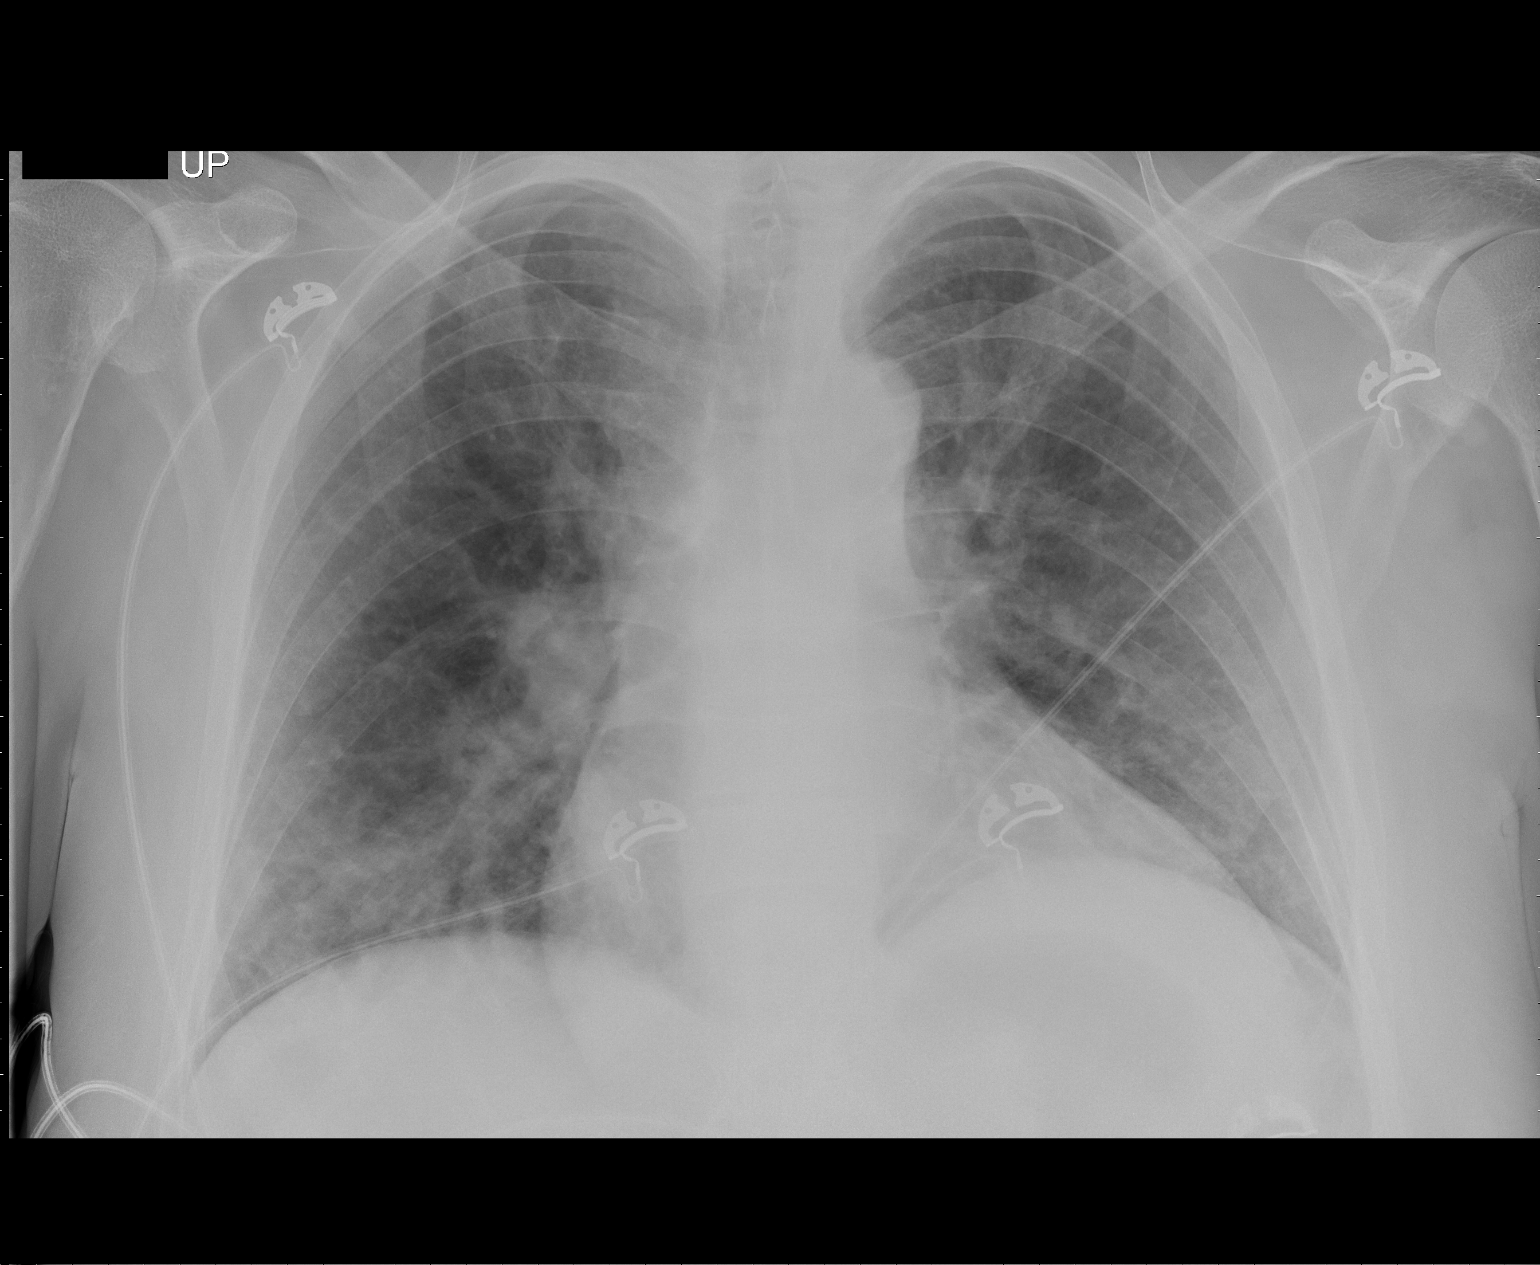

[1 of 1 positions shown; findings below may reference images not displayed]

FINDINGS: There is chronic cardiomegaly.  The main pulmonary
arteries are prominent.  There is faint haziness at the right lung
base, probably representing focal pulmonary edema or a residual
infiltrate.  The overall appearance of the lungs has improved
substantially since 06/21/2010.
IMPRESSION: 1.  Clearing of the left lung since the prior study.
2.  Almost complete clearing of the infiltrate at the right base
since the prior study.  This could represent residual pneumonia or
pulmonary edema.

## 2012-01-31 ENCOUNTER — Emergency Department (HOSPITAL_COMMUNITY)
Admission: EM | Admit: 2012-01-31 | Discharge: 2012-01-31 | Disposition: A | Discharge status: Home / Self Care | Payer: Medicare Other | Attending: Emergency Medicine | Admitting: Emergency Medicine

## 2012-01-31 ENCOUNTER — Encounter (HOSPITAL_COMMUNITY): Payer: Self-pay | Admitting: *Deleted

## 2012-01-31 DIAGNOSIS — S39012A Strain of muscle, fascia and tendon of lower back, initial encounter: Secondary | ICD-10-CM

## 2012-01-31 DIAGNOSIS — N186 End stage renal disease: Secondary | ICD-10-CM | POA: Insufficient documentation

## 2012-01-31 DIAGNOSIS — J449 Chronic obstructive pulmonary disease, unspecified: Secondary | ICD-10-CM | POA: Insufficient documentation

## 2012-01-31 DIAGNOSIS — S335XXA Sprain of ligaments of lumbar spine, initial encounter: Secondary | ICD-10-CM | POA: Insufficient documentation

## 2012-01-31 DIAGNOSIS — F17200 Nicotine dependence, unspecified, uncomplicated: Secondary | ICD-10-CM | POA: Insufficient documentation

## 2012-01-31 DIAGNOSIS — I120 Hypertensive chronic kidney disease with stage 5 chronic kidney disease or end stage renal disease: Secondary | ICD-10-CM | POA: Insufficient documentation

## 2012-01-31 DIAGNOSIS — X500XXA Overexertion from strenuous movement or load, initial encounter: Secondary | ICD-10-CM | POA: Insufficient documentation

## 2012-01-31 DIAGNOSIS — I2589 Other forms of chronic ischemic heart disease: Secondary | ICD-10-CM | POA: Insufficient documentation

## 2012-01-31 DIAGNOSIS — F209 Schizophrenia, unspecified: Secondary | ICD-10-CM | POA: Insufficient documentation

## 2012-01-31 DIAGNOSIS — R231 Pallor: Secondary | ICD-10-CM | POA: Insufficient documentation

## 2012-01-31 DIAGNOSIS — J4489 Other specified chronic obstructive pulmonary disease: Secondary | ICD-10-CM | POA: Insufficient documentation

## 2012-01-31 IMAGING — CR DG CHEST 2V
2 series · 2 of 2 positions shown · non-contrast
Comparison: 06/25/2010

CLINICAL DATA: Left-sided pain, headache.

CHEST - 2 VIEW

[view not recorded (1 of 2)]
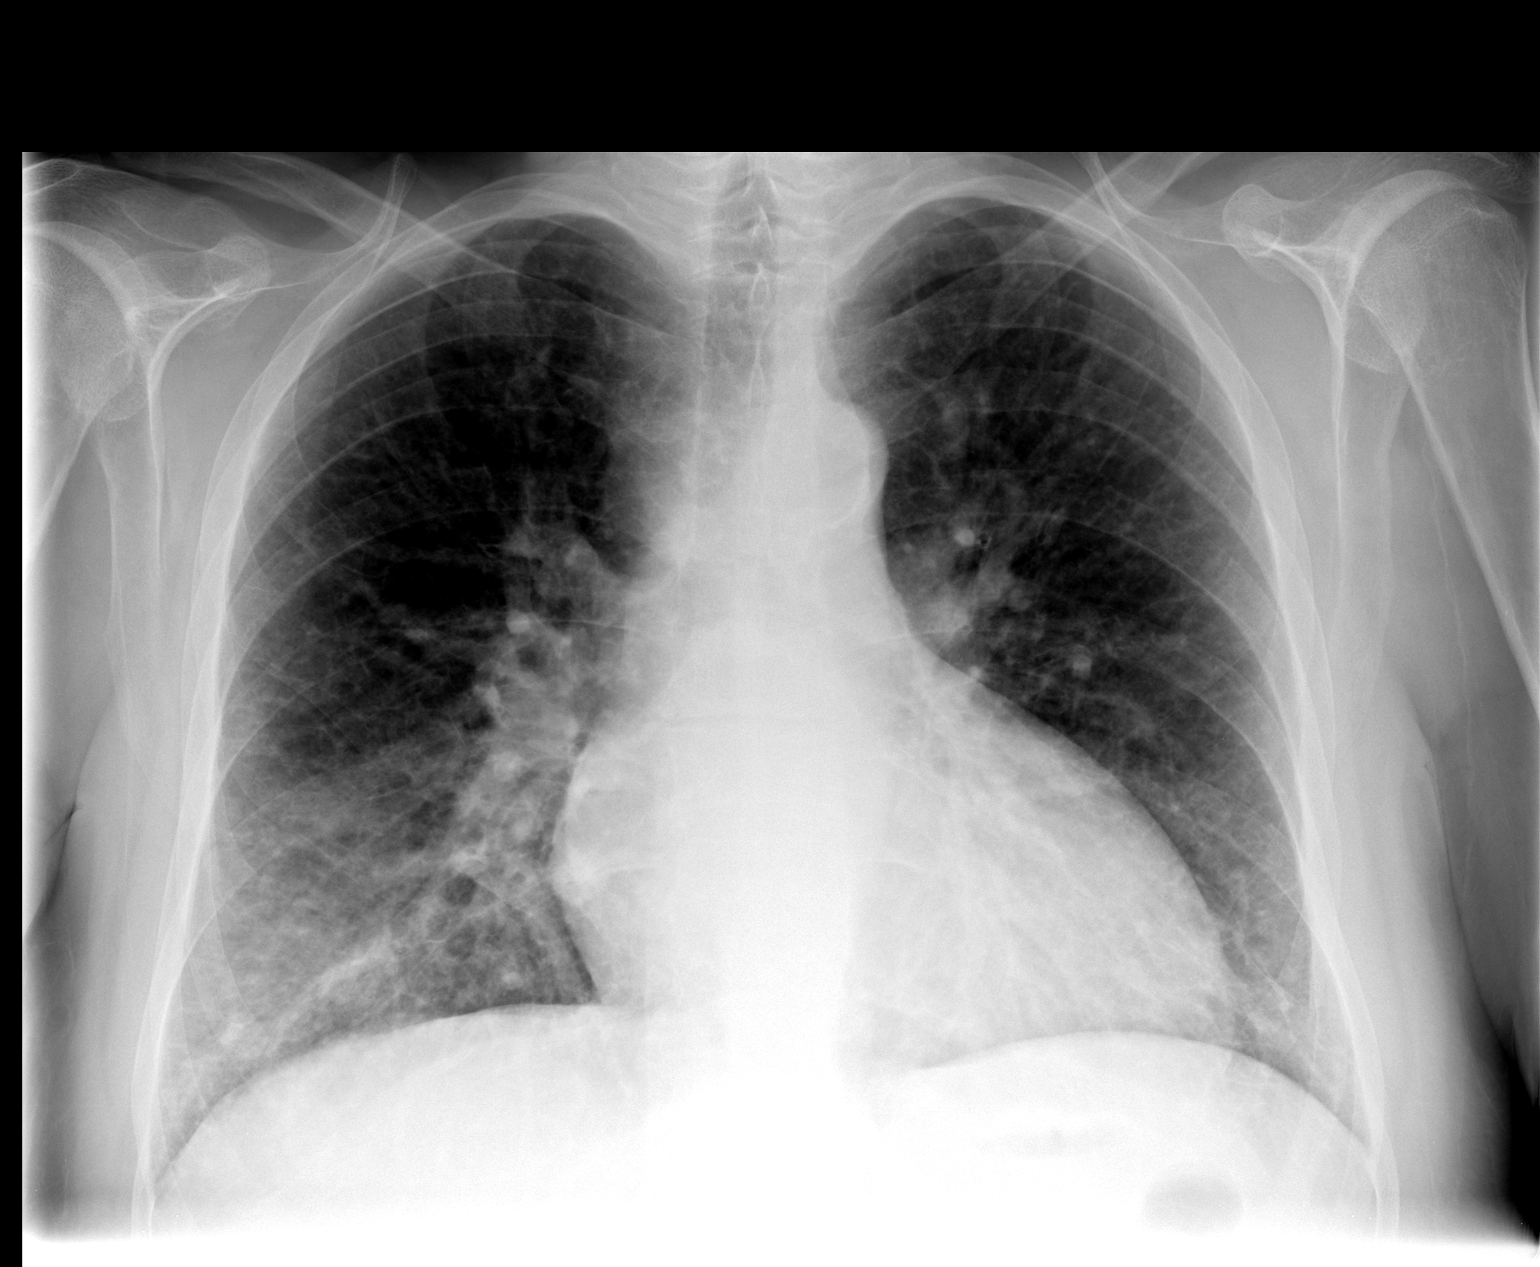

[view not recorded (2 of 2)]
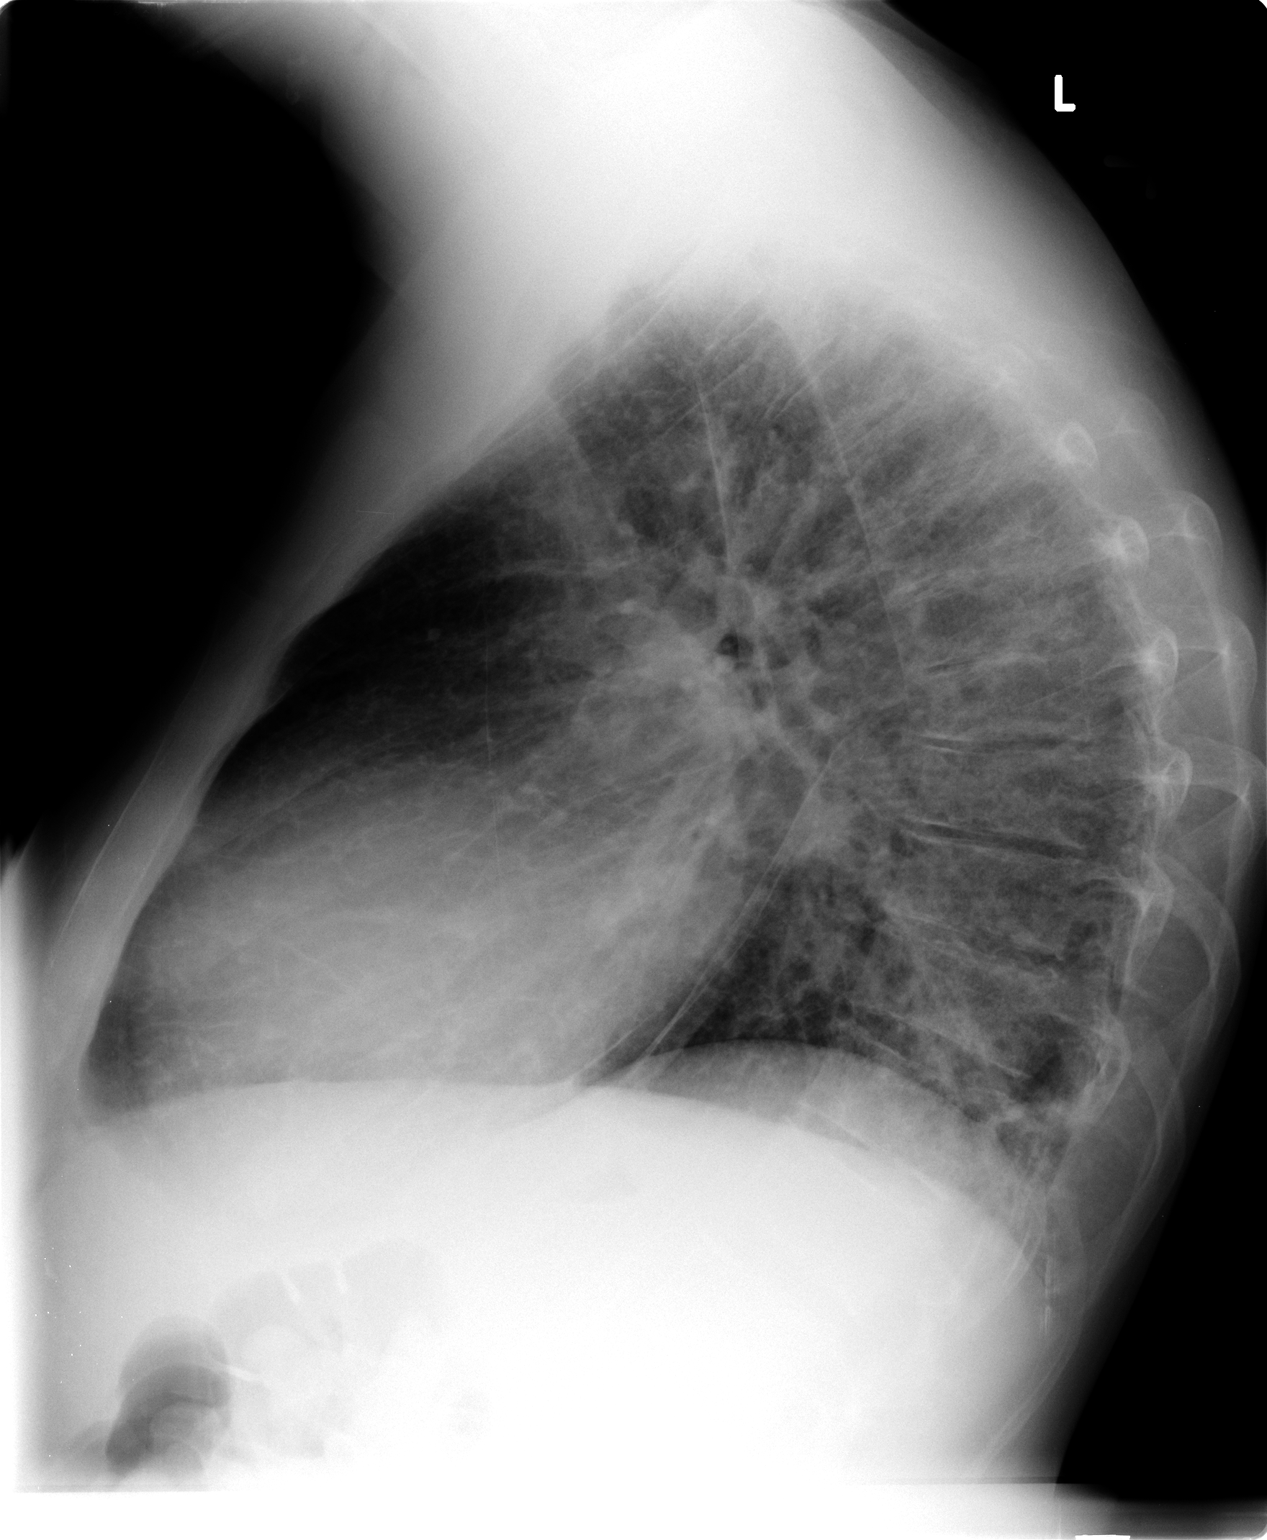

[2 of 2 positions shown; findings below may reference images not displayed]

FINDINGS: There is cardiomegaly.  Chronic interstitial prominence
again noted.  This is stable dating back to 3696, compatible with
chronic interstitial lung disease.  Bibasilar atelectasis or
scarring is similar to prior study.  No effusions or acute
infiltrates.
IMPRESSION: Stable chronic interstitial lung disease and chronic bibasilar
opacities, atelectasis versus scarring.

Stable cardiomegaly.

## 2012-01-31 MED ORDER — DIAZEPAM 5 MG PO TABS
5.0000 mg | ORAL_TABLET | Freq: Once | ORAL | Status: AC
  Administered 2012-01-31: 5 mg via ORAL
  Filled 2012-01-31: qty 1

## 2012-01-31 MED ORDER — DEXAMETHASONE SODIUM PHOSPHATE 4 MG/ML IJ SOLN
8.0000 mg | Freq: Once | INTRAMUSCULAR | Status: AC
  Administered 2012-01-31: 8 mg via INTRAMUSCULAR
  Filled 2012-01-31: qty 2

## 2012-01-31 NOTE — ED Notes (Signed)
Pt states was moving furniture earlier in the day and "pulled a muscle". Pt presents with lower back pain.
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
## 2012-01-31 NOTE — ED Provider Notes (Signed)
Medical screening examination/treatment/procedure(s) were performed by non-physician practitioner and as supervising physician I was immediately available for consultation/collaboration.   Joya Gaskins, MD 01/31/12 [phone]

## 2012-01-31 NOTE — Discharge Instructions (Signed)
Your exam is consistent with a strain of the lower lumbar area. Please apply heat to your back tonight. Continue your current medications. See Dr Janna Arch for additional management of your back tomorrow. Your were treated with a muscle relaxant today, use caution getting around.Lumbosacral Strain Lumbosacral strain is one of the most common causes of back pain. There are many causes of back pain. Most are not serious conditions. CAUSES  Your backbone (spinal column) is made up of 24 main vertebral bodies, the sacrum, and the coccyx. These are held together by muscles and tough, fibrous tissue (ligaments). Nerve roots pass through the openings between the vertebrae. A sudden move or injury to the back may cause injury to, or pressure on, these nerves. This may result in localized back pain or pain movement (radiation) into the buttocks, down the leg, and into the foot. Sharp, shooting pain from the buttock down the back of the leg (sciatica) is frequently associated with a ruptured (herniated) disk. Pain may be caused by muscle spasm alone. Your caregiver can often find the cause of your pain by the details of your symptoms and an exam. In some cases, you may need tests (such as X-rays). Your caregiver will work with you to decide if any tests are needed based on your specific exam. HOME CARE INSTRUCTIONS   Avoid an underactive lifestyle. Active exercise, as directed by your caregiver, is your greatest weapon against back pain.   Avoid hard physical activities (tennis, racquetball, waterskiing) if you are not in proper physical condition for it. This may aggravate or create problems.   If you have a back problem, avoid sports requiring sudden body movements. Swimming and walking are generally safer activities.   Maintain good posture.   Avoid becoming overweight (obese).   Use bed rest for only the most extreme, sudden (acute) episode. Your caregiver will help you determine how much bed rest is  necessary.   For acute conditions, you may put ice on the injured area.   Put ice in a plastic bag.   Place a towel between your skin and the bag.   Leave the ice on for 15 to 20 minutes at a time, every 2 hours, or as needed.   After you are improved and more active, it may help to apply heat for 30 minutes before activities.  See your caregiver if you are having pain that lasts longer than expected. Your caregiver can advise appropriate exercises or therapy if needed. With conditioning, most back problems can be avoided. SEEK IMMEDIATE MEDICAL CARE IF:   You have numbness, tingling, weakness, or problems with the use of your arms or legs.   You experience severe back pain not relieved with medicines.   There is a change in bowel or bladder control.   You have increasing pain in any area of the body, including your belly (abdomen).   You notice shortness of breath, dizziness, or feel faint.   You feel sick to your stomach (nauseous), are throwing up (vomiting), or become sweaty.   You notice discoloration of your toes or legs, or your feet get very cold.   Your back pain is getting worse.   You have a fever.  MAKE SURE YOU:   Understand these instructions.   Will watch your condition.   Will get help right away if you are not doing well or get worse.  Document Released: 09/14/2005 Document Revised: 08/17/2011 Document Reviewed: 03/06/2009 [facility] Patient Information 2012 [place], [place].

## 2012-01-31 NOTE — ED Notes (Signed)
Lower back pain after moving furniture today, states heard something "pop".  Denies numbness/tingling.

## 2012-02-02 ENCOUNTER — Emergency Department (HOSPITAL_COMMUNITY)
Admission: EM | Admit: 2012-02-02 | Discharge: 2012-02-02 | Disposition: A | Discharge status: Home / Self Care | Payer: Medicare Other | Attending: Emergency Medicine | Admitting: Emergency Medicine

## 2012-02-02 ENCOUNTER — Encounter (HOSPITAL_COMMUNITY): Payer: Self-pay | Admitting: *Deleted

## 2012-02-02 ENCOUNTER — Emergency Department (HOSPITAL_COMMUNITY): Payer: Medicare Other

## 2012-02-02 ENCOUNTER — Other Ambulatory Visit: Payer: Self-pay

## 2012-02-02 DIAGNOSIS — G894 Chronic pain syndrome: Secondary | ICD-10-CM | POA: Insufficient documentation

## 2012-02-02 DIAGNOSIS — Z992 Dependence on renal dialysis: Secondary | ICD-10-CM | POA: Insufficient documentation

## 2012-02-02 DIAGNOSIS — I120 Hypertensive chronic kidney disease with stage 5 chronic kidney disease or end stage renal disease: Secondary | ICD-10-CM | POA: Insufficient documentation

## 2012-02-02 DIAGNOSIS — F209 Schizophrenia, unspecified: Secondary | ICD-10-CM | POA: Insufficient documentation

## 2012-02-02 DIAGNOSIS — R079 Chest pain, unspecified: Secondary | ICD-10-CM | POA: Insufficient documentation

## 2012-02-02 DIAGNOSIS — R062 Wheezing: Secondary | ICD-10-CM | POA: Insufficient documentation

## 2012-02-02 DIAGNOSIS — F319 Bipolar disorder, unspecified: Secondary | ICD-10-CM | POA: Insufficient documentation

## 2012-02-02 DIAGNOSIS — I2589 Other forms of chronic ischemic heart disease: Secondary | ICD-10-CM | POA: Insufficient documentation

## 2012-02-02 DIAGNOSIS — J4489 Other specified chronic obstructive pulmonary disease: Secondary | ICD-10-CM | POA: Insufficient documentation

## 2012-02-02 DIAGNOSIS — Z79899 Other long term (current) drug therapy: Secondary | ICD-10-CM | POA: Insufficient documentation

## 2012-02-02 DIAGNOSIS — N186 End stage renal disease: Secondary | ICD-10-CM | POA: Insufficient documentation

## 2012-02-02 DIAGNOSIS — J449 Chronic obstructive pulmonary disease, unspecified: Secondary | ICD-10-CM | POA: Insufficient documentation

## 2012-02-02 DIAGNOSIS — M549 Dorsalgia, unspecified: Secondary | ICD-10-CM

## 2012-02-02 HISTORY — DX: Chronic obstructive pulmonary disease, unspecified: J44.9

## 2012-02-02 LAB — DIFFERENTIAL
Basophils Absolute: 0 10*3/uL (ref 0.0–0.1)
Eosinophils Absolute: 0.1 10*3/uL (ref 0.0–0.7)
Eosinophils Relative: 2 % (ref 0–5)
Lymphocytes Relative: 32 % (ref 12–46)

## 2012-02-02 LAB — CBC
MCH: 30.2 pg (ref 26.0–34.0)
MCHC: 33.8 g/dL (ref 30.0–36.0)
MCV: 89.5 fL (ref 78.0–100.0)
Platelets: 205 10*3/uL (ref 150–400)
RDW: 17.8 % — ABNORMAL HIGH (ref 11.5–15.5)
WBC: 6.7 10*3/uL (ref 4.0–10.5)

## 2012-02-02 LAB — POCT I-STAT TROPONIN I: Troponin i, poc: 0.04 ng/mL (ref 0.00–0.08)

## 2012-02-02 LAB — BASIC METABOLIC PANEL
CO2: 26 mEq/L (ref 19–32)
Calcium: 10.5 mg/dL (ref 8.4–10.5)
GFR calc non Af Amer: 25 mL/min — ABNORMAL LOW (ref >90)
Potassium: 3.5 mEq/L (ref 3.5–5.1)
Sodium: 141 mEq/L (ref 135–145)

## 2012-02-02 MED ORDER — HYDROMORPHONE HCL PF 2 MG/ML IJ SOLN
2.0000 mg | Freq: Once | INTRAMUSCULAR | Status: AC
  Administered 2012-02-02: 2 mg via INTRAMUSCULAR
  Filled 2012-02-02: qty 1

## 2012-02-02 MED ORDER — ASPIRIN 81 MG PO CHEW: 324.0000 mg | CHEWABLE_TABLET | Freq: Once | ORAL | Status: DC

## 2012-02-02 MED ORDER — ALBUTEROL SULFATE (5 MG/ML) 0.5% IN NEBU
5.0000 mg | INHALATION_SOLUTION | Freq: Once | RESPIRATORY_TRACT | Status: AC
  Administered 2012-02-02: 5 mg via RESPIRATORY_TRACT
  Filled 2012-02-02: qty 1

## 2012-02-02 NOTE — Discharge Instructions (Signed)
Chest Pain (Nonspecific) It is often hard to give a specific diagnosis for the cause of chest pain. There is always a chance that your pain could be related to something serious, such as a heart attack or a blood clot in the lungs. You need to follow up with your caregiver for further evaluation. CAUSES   Heartburn.   Pneumonia or bronchitis.   Anxiety and stress.   Inflammation around your heart (pericarditis) or lung (pleuritis or pleurisy).   A blood clot in the lung.   A collapsed lung (pneumothorax). It can develop suddenly on its own (spontaneous pneumothorax) or from injury (trauma) to the chest.  The chest wall is composed of bones, muscles, and cartilage. Any of these can be the source of the pain.  The bones can be bruised by injury.   The muscles or cartilage can be strained by coughing or overwork.   The cartilage can be affected by inflammation and become sore (costochondritis).  DIAGNOSIS  Lab tests or other studies, such as X-rays, an EKG, stress testing, or cardiac imaging, may be needed to find the cause of your pain.  TREATMENT   Treatment depends on what may be causing your chest pain. Treatment may include:   Acid blockers for heartburn.   Anti-inflammatory medicine.   Pain medicine for inflammatory conditions.   Antibiotics if an infection is present.   You may be advised to change lifestyle habits. This includes stopping smoking and avoiding caffeine and chocolate.   You may be advised to keep your head raised (elevated) when sleeping. This reduces the chance of acid going backward from your stomach into your esophagus.   Most of the time, nonspecific chest pain will improve within 2 to 3 days with rest and mild pain medicine.  HOME CARE INSTRUCTIONS   If antibiotics were prescribed, take the full amount even if you start to feel better.   For the next few days, avoid physical activities that bring on chest pain. Continue physical activities as  directed.   Do not smoke cigarettes or drink alcohol until your symptoms are gone.   Only take over-the-counter or prescription medicine for pain, discomfort, or fever as directed by your caregiver.   Follow your caregiver's suggestions for further testing if your chest pain does not go away.   Keep any follow-up appointments you made. If you do not go to an appointment, you could develop lasting (chronic) problems with pain. If there is any problem keeping an appointment, you must call to reschedule.  SEEK MEDICAL CARE IF:   You think you are having problems from the medicine you are taking. Read your medicine instructions carefully.   Your chest pain does not go away, even after treatment.   You develop a rash with blisters on your chest.  SEEK IMMEDIATE MEDICAL CARE IF:   You have increased chest pain or pain that spreads to your arm, neck, jaw, back, or belly (abdomen).   You develop shortness of breath, an increasing cough, or you are coughing up blood.   You have severe back or abdominal pain, feel sick to your stomach (nauseous) or throw up (vomit).   You develop severe weakness, fainting, or chills.   You have an oral temperature above 102 F (38.9 C), not controlled by medicine.  THIS IS AN EMERGENCY. Do not wait to see if the pain will go away. Get medical help at once. Call your local emergency services (911 in [place] Do not drive yourself to   the hospital. MAKE SURE YOU:   Understand these instructions.   Will watch your condition.   Will get help right away if you are not doing well or get worse.  Document Released: 09/14/2005 Document Revised: 08/17/2011 Document Reviewed: 07/10/2008 [employer] Patient Information 2012 [employer]. 

## 2012-02-02 NOTE — ED Notes (Signed)
Took 4 aspirin 81 mg pta to ER
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
## 2012-02-02 NOTE — ED Notes (Signed)
Chest and back pain, onset 6 30 am while at dialysis.

## 2012-02-03 IMAGING — CR DG CHEST 1V PORT
1 series · 1 of 1 positions shown · non-contrast
Comparison: 07/18/2010

CLINICAL DATA: Chest pain and shortness of breath.

PORTABLE CHEST - 1 VIEW

[view not recorded]
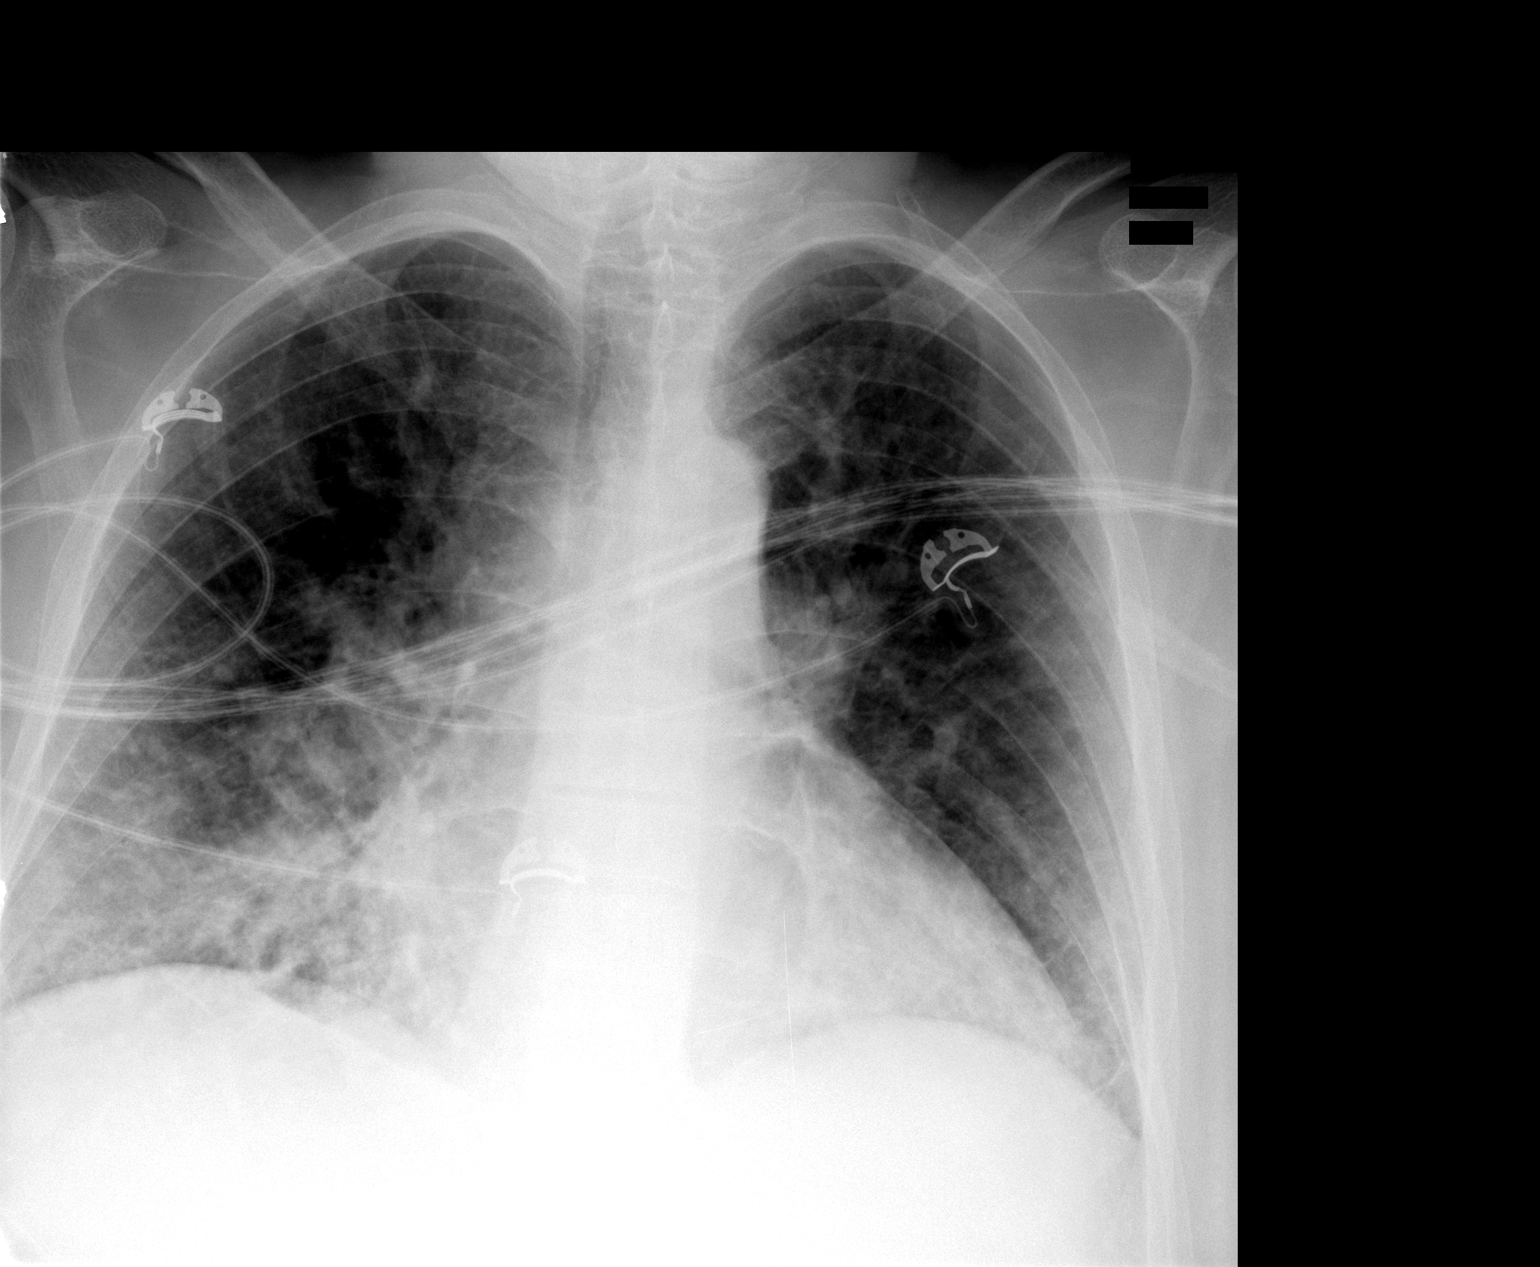

[1 of 1 positions shown; findings below may reference images not displayed]

FINDINGS: The heart is enlarged but stable.  The mediastinal and
hilar contours are prominent but unchanged.  There is vascular
congestion and probable overlying interstitial pulmonary edema.
There are underlying chronic interstitial lung changes.  No pleural
effusions.  No pneumothorax.
IMPRESSION: Cardiac enlargement with vascular congestion and pulmonary edema
superimposed on underlying interstitial disease.

## 2012-02-07 ENCOUNTER — Other Ambulatory Visit: Payer: Self-pay

## 2012-02-07 ENCOUNTER — Emergency Department (HOSPITAL_COMMUNITY)
Admission: EM | Admit: 2012-02-07 | Discharge: 2012-02-07 | Disposition: A | Discharge status: Home / Self Care | Payer: Medicare Other | Attending: Emergency Medicine | Admitting: Emergency Medicine

## 2012-02-07 ENCOUNTER — Encounter (HOSPITAL_COMMUNITY): Payer: Self-pay | Admitting: *Deleted

## 2012-02-07 DIAGNOSIS — Z862 Personal history of diseases of the blood and blood-forming organs and certain disorders involving the immune mechanism: Secondary | ICD-10-CM | POA: Insufficient documentation

## 2012-02-07 DIAGNOSIS — N186 End stage renal disease: Secondary | ICD-10-CM | POA: Insufficient documentation

## 2012-02-07 DIAGNOSIS — F209 Schizophrenia, unspecified: Secondary | ICD-10-CM | POA: Insufficient documentation

## 2012-02-07 DIAGNOSIS — F319 Bipolar disorder, unspecified: Secondary | ICD-10-CM | POA: Insufficient documentation

## 2012-02-07 DIAGNOSIS — I2589 Other forms of chronic ischemic heart disease: Secondary | ICD-10-CM | POA: Insufficient documentation

## 2012-02-07 DIAGNOSIS — R0789 Other chest pain: Secondary | ICD-10-CM

## 2012-02-07 DIAGNOSIS — I120 Hypertensive chronic kidney disease with stage 5 chronic kidney disease or end stage renal disease: Secondary | ICD-10-CM | POA: Insufficient documentation

## 2012-02-07 DIAGNOSIS — F17200 Nicotine dependence, unspecified, uncomplicated: Secondary | ICD-10-CM | POA: Insufficient documentation

## 2012-02-07 DIAGNOSIS — Z9861 Coronary angioplasty status: Secondary | ICD-10-CM | POA: Insufficient documentation

## 2012-02-07 DIAGNOSIS — Z992 Dependence on renal dialysis: Secondary | ICD-10-CM | POA: Insufficient documentation

## 2012-02-07 DIAGNOSIS — J449 Chronic obstructive pulmonary disease, unspecified: Secondary | ICD-10-CM | POA: Insufficient documentation

## 2012-02-07 DIAGNOSIS — Z7982 Long term (current) use of aspirin: Secondary | ICD-10-CM | POA: Insufficient documentation

## 2012-02-07 DIAGNOSIS — J4489 Other specified chronic obstructive pulmonary disease: Secondary | ICD-10-CM | POA: Insufficient documentation

## 2012-02-07 DIAGNOSIS — R071 Chest pain on breathing: Secondary | ICD-10-CM | POA: Insufficient documentation

## 2012-02-07 MED ORDER — LEVALBUTEROL HCL 1.25 MG/0.5ML IN NEBU
1.2500 mg | INHALATION_SOLUTION | Freq: Once | RESPIRATORY_TRACT | Status: AC
  Administered 2012-02-07: 1.25 mg via RESPIRATORY_TRACT

## 2012-02-07 MED ORDER — HYDROMORPHONE HCL PF 2 MG/ML IJ SOLN
INTRAMUSCULAR | Status: AC
  Administered 2012-02-07: 2 mg
  Filled 2012-02-07: qty 1

## 2012-02-07 MED ORDER — HYDROMORPHONE HCL PF 1 MG/ML IJ SOLN: 2.0000 mg | Freq: Once | INTRAMUSCULAR | Status: DC

## 2012-02-07 MED ORDER — HYDROMORPHONE HCL PF 1 MG/ML IJ SOLN
1.0000 mg | Freq: Once | INTRAMUSCULAR | Status: AC
  Administered 2012-02-07: 1 mg via INTRAMUSCULAR
  Filled 2012-02-07: qty 1

## 2012-02-07 NOTE — Discharge Instructions (Signed)
Follow up with your md as needed °
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
## 2012-02-07 NOTE — ED Notes (Signed)
Took 3 ntg  Today and 8 baby aspirin,  Low back hurts also after moving furniture 2 days ago

## 2012-02-22 IMAGING — CR DG CHEST 2V
2 series · 2 of 2 positions shown · non-contrast
Comparison: 07/21/2010 and earlier.

CLINICAL DATA: 33-year-old male with weakness, shortness of breath.

CHEST - 2 VIEW

[view not recorded (1 of 2)]
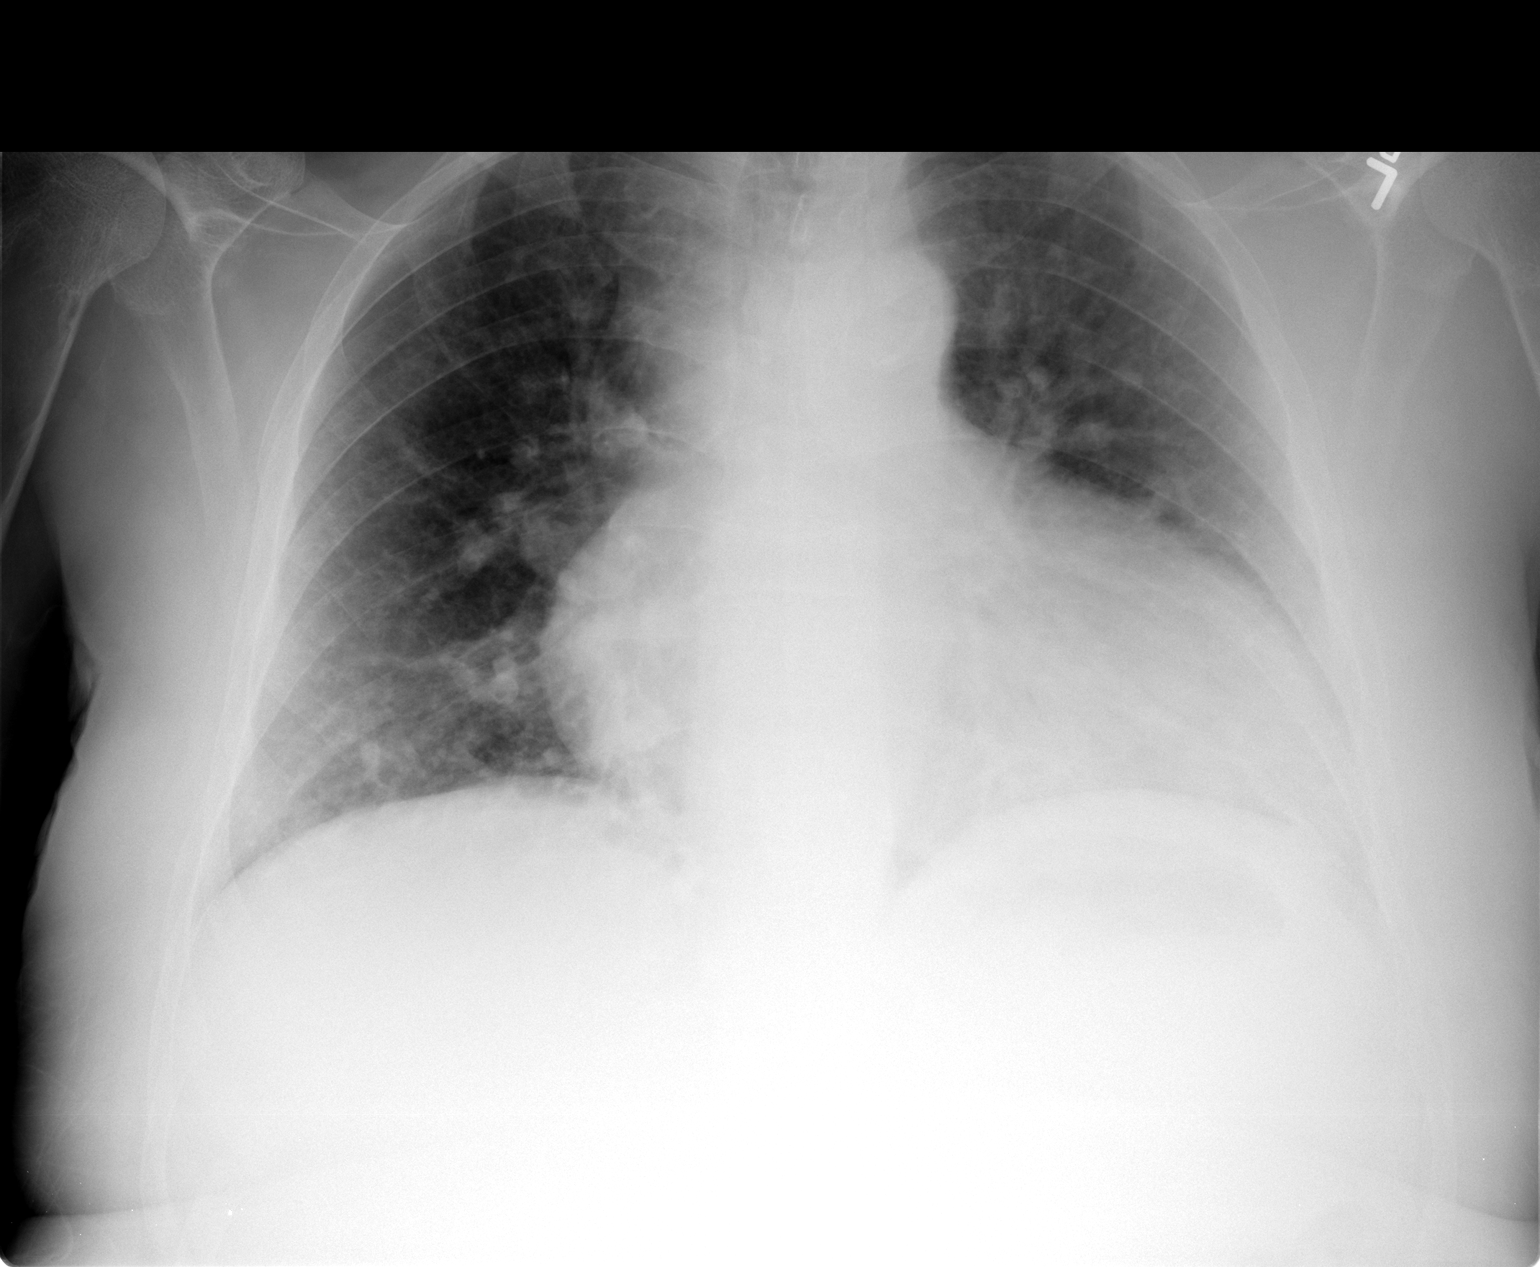

[view not recorded (2 of 2)]
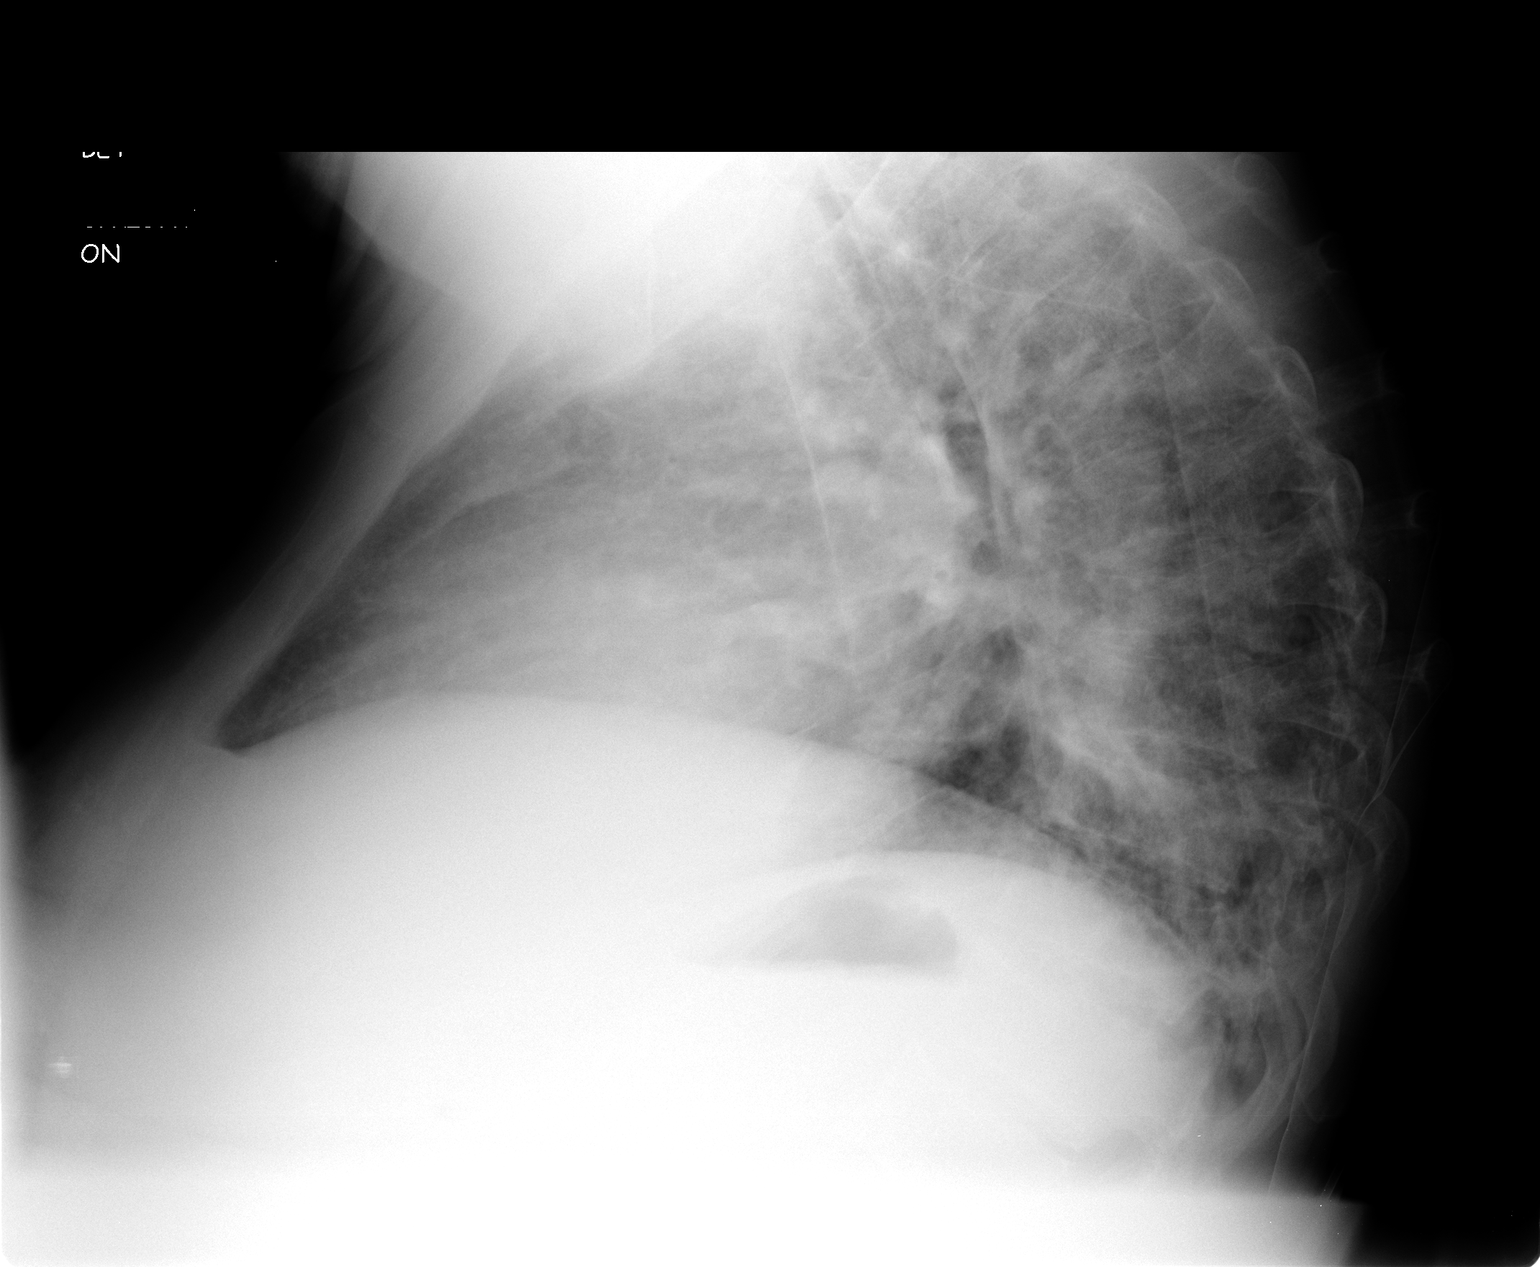

[2 of 2 positions shown; findings below may reference images not displayed]

FINDINGS: Seated AP and lateral views of the chest.  Marked
cardiomegaly re-identified. Other mediastinal contours are within
normal limits.  Visualized tracheal air column is within normal
limits.  Diffuse pulmonary vascular congestion.  No definite
effusion.  No consolidation.  Left greater than right lower lobe
atelectasis. Stable visualized osseous structures.
IMPRESSION: Cardiomegaly with vascular congestion; appearance suggestive of
mild interstitial edema.

## 2012-02-22 IMAGING — CR DG FOOT COMPLETE 3+V*L*
3 series · 3 of 3 positions shown · non-contrast
Comparison: None.

CLINICAL DATA: 33-year-old male with weakness, blunt trauma.  Pain.

LEFT FOOT - COMPLETE 3+ VIEW

[view not recorded (1 of 3)]
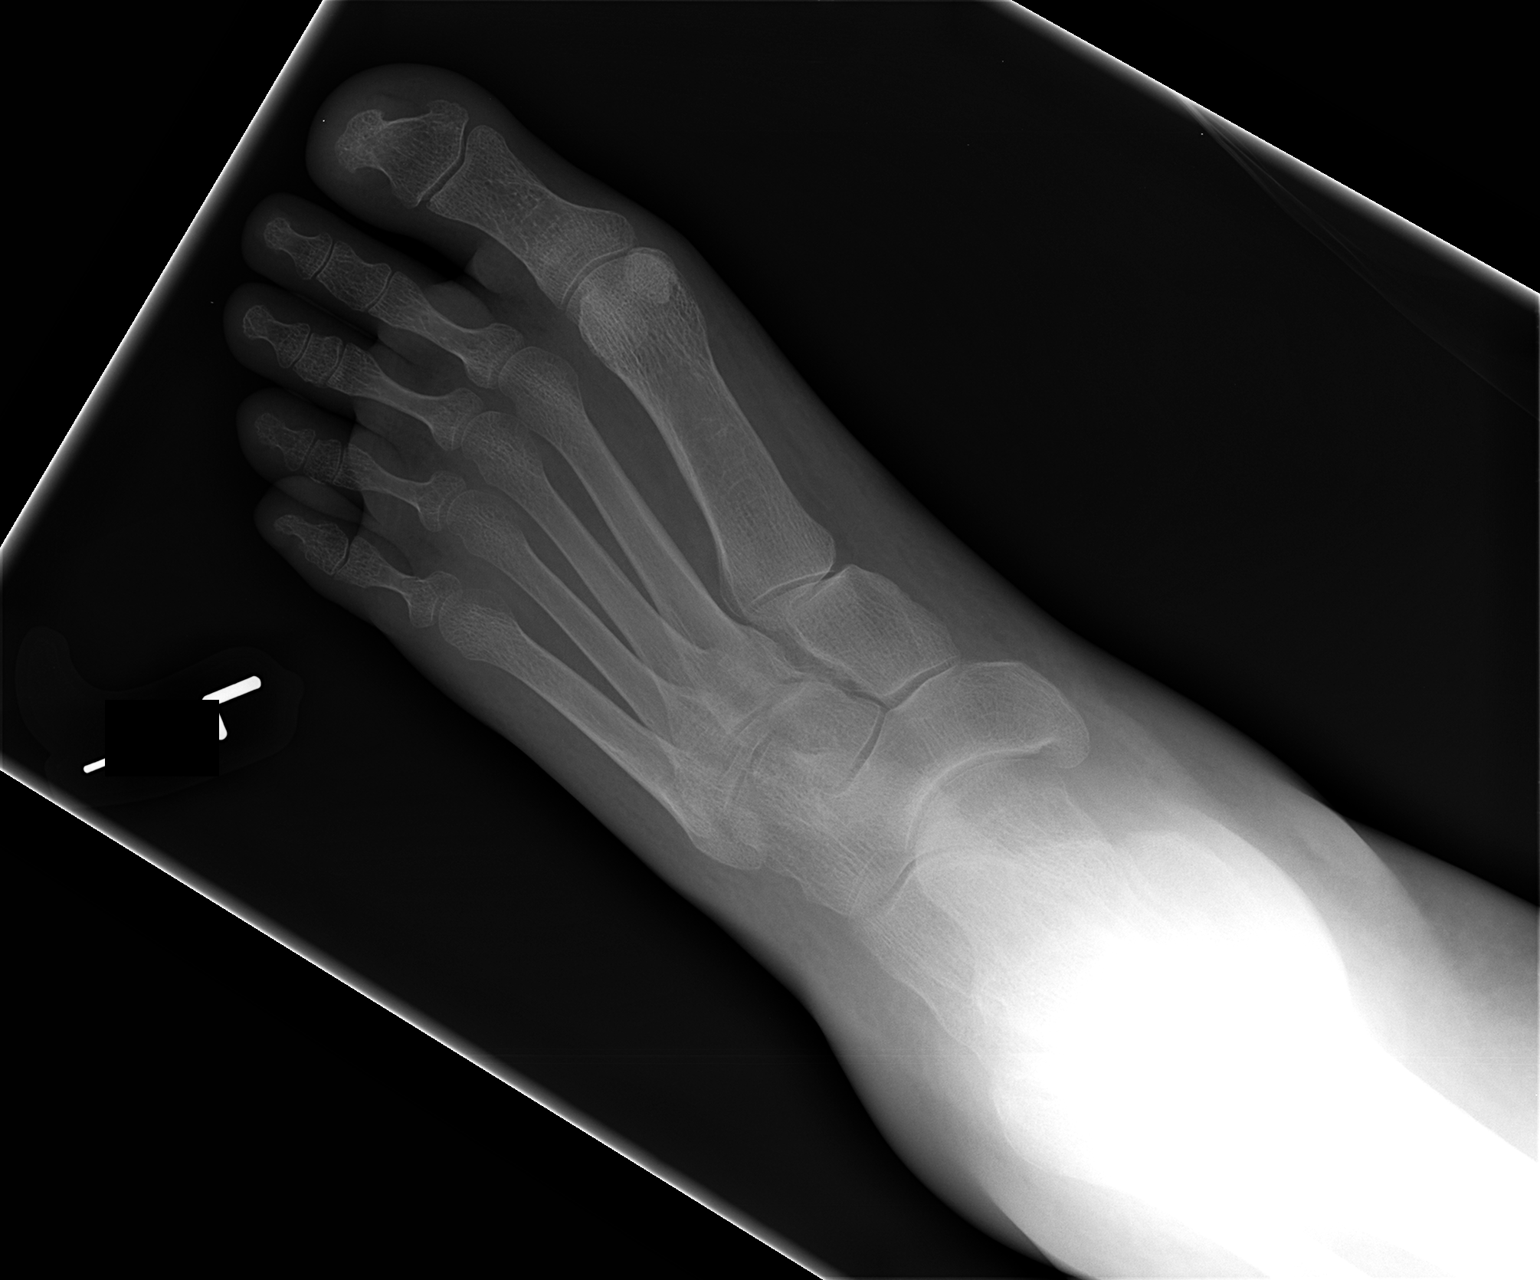

[view not recorded (2 of 3)]
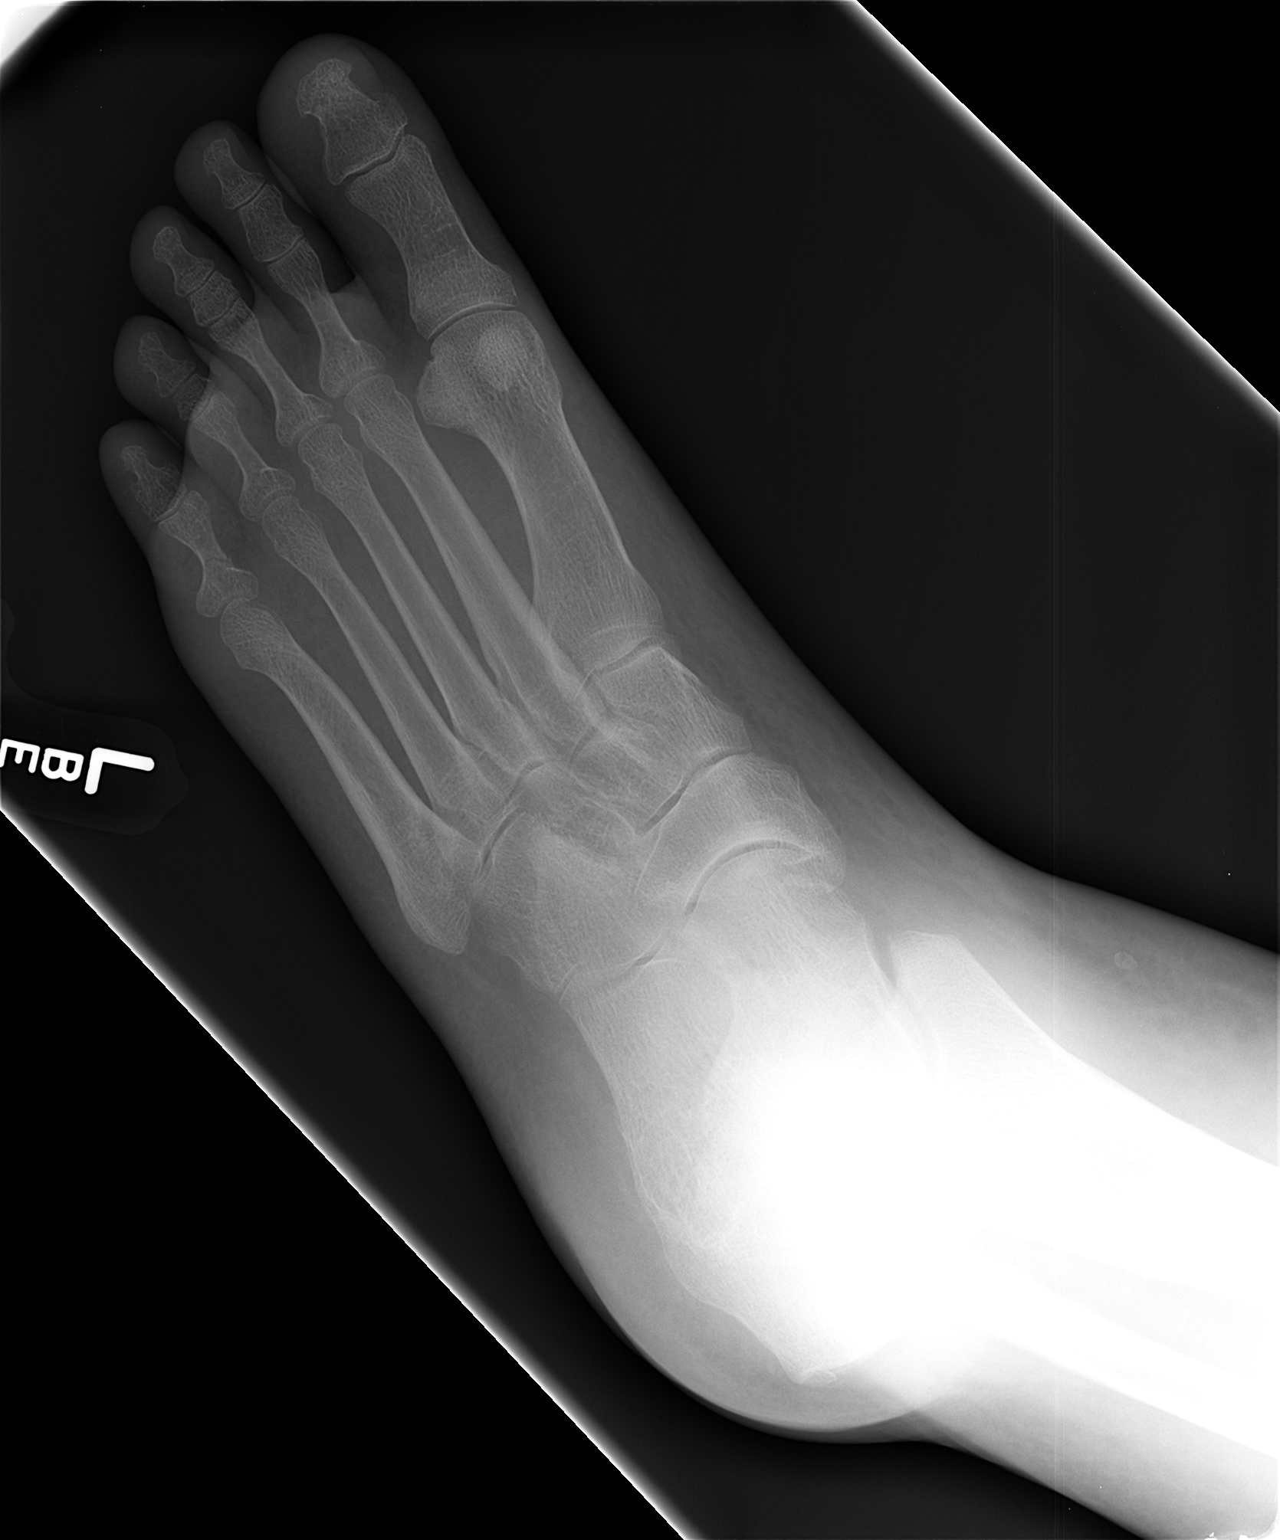

[view not recorded (3 of 3)]
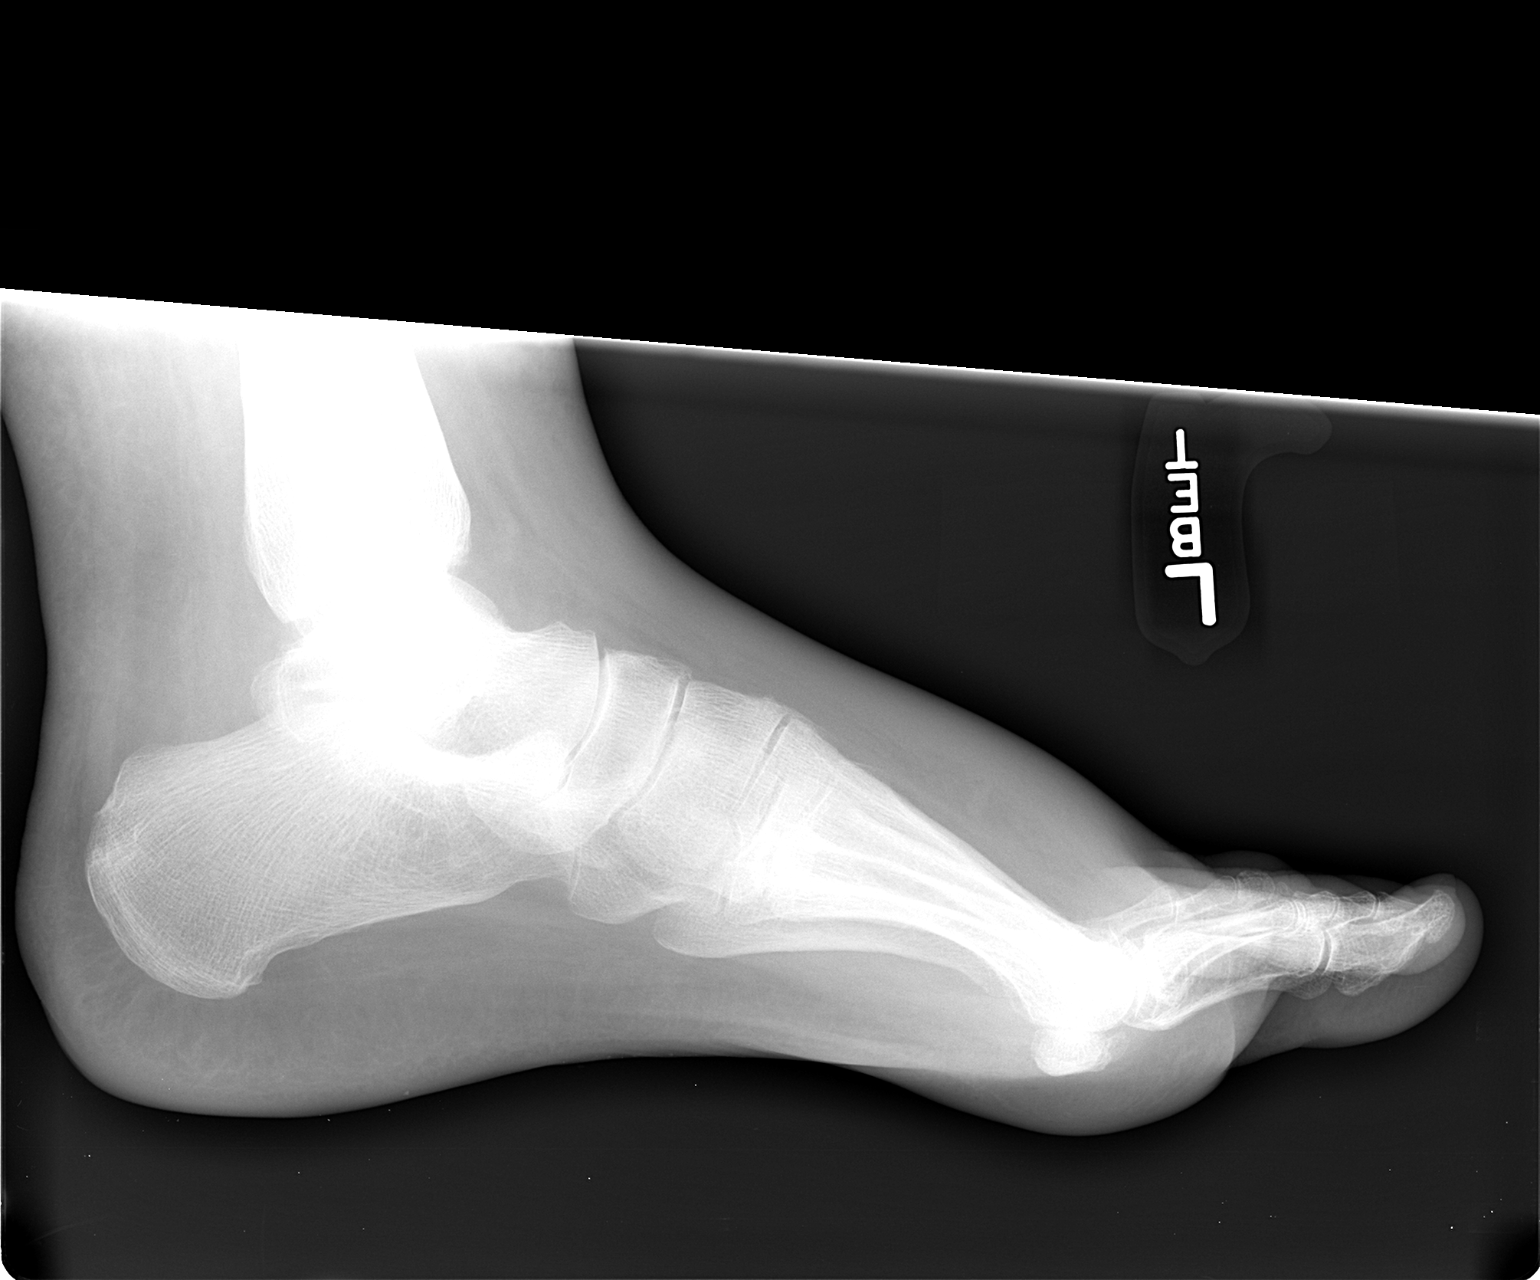

[3 of 3 positions shown; findings below may reference images not displayed]

FINDINGS: Osteopenia.  Nondisplaced fracture left fifth proximal
phalangeal shaft.  No dislocation.  Joint spaces within normal
limits.  Calcaneus and other visualized osseous structures appear
intact.
IMPRESSION: Nondisplaced left fifth proximal phalanx fracture.

## 2012-02-25 ENCOUNTER — Encounter (HOSPITAL_COMMUNITY): Payer: Self-pay

## 2012-02-25 ENCOUNTER — Emergency Department (HOSPITAL_COMMUNITY): Payer: Medicare Other

## 2012-02-25 ENCOUNTER — Emergency Department (HOSPITAL_COMMUNITY)
Admission: EM | Admit: 2012-02-25 | Discharge: 2012-02-25 | Disposition: A | Discharge status: Home / Self Care | Payer: Medicare Other | Attending: Emergency Medicine | Admitting: Emergency Medicine

## 2012-02-25 DIAGNOSIS — F17200 Nicotine dependence, unspecified, uncomplicated: Secondary | ICD-10-CM | POA: Insufficient documentation

## 2012-02-25 DIAGNOSIS — F319 Bipolar disorder, unspecified: Secondary | ICD-10-CM | POA: Insufficient documentation

## 2012-02-25 DIAGNOSIS — S8001XA Contusion of right knee, initial encounter: Secondary | ICD-10-CM

## 2012-02-25 DIAGNOSIS — W06XXXA Fall from bed, initial encounter: Secondary | ICD-10-CM | POA: Insufficient documentation

## 2012-02-25 DIAGNOSIS — J449 Chronic obstructive pulmonary disease, unspecified: Secondary | ICD-10-CM | POA: Insufficient documentation

## 2012-02-25 DIAGNOSIS — N186 End stage renal disease: Secondary | ICD-10-CM | POA: Insufficient documentation

## 2012-02-25 DIAGNOSIS — Z79899 Other long term (current) drug therapy: Secondary | ICD-10-CM | POA: Insufficient documentation

## 2012-02-25 DIAGNOSIS — I120 Hypertensive chronic kidney disease with stage 5 chronic kidney disease or end stage renal disease: Secondary | ICD-10-CM | POA: Insufficient documentation

## 2012-02-25 DIAGNOSIS — G894 Chronic pain syndrome: Secondary | ICD-10-CM | POA: Insufficient documentation

## 2012-02-25 DIAGNOSIS — Z992 Dependence on renal dialysis: Secondary | ICD-10-CM | POA: Insufficient documentation

## 2012-02-25 DIAGNOSIS — M545 Low back pain, unspecified: Secondary | ICD-10-CM | POA: Insufficient documentation

## 2012-02-25 DIAGNOSIS — J4489 Other specified chronic obstructive pulmonary disease: Secondary | ICD-10-CM | POA: Insufficient documentation

## 2012-02-25 DIAGNOSIS — S8000XA Contusion of unspecified knee, initial encounter: Secondary | ICD-10-CM | POA: Insufficient documentation

## 2012-02-25 DIAGNOSIS — M25569 Pain in unspecified knee: Secondary | ICD-10-CM | POA: Insufficient documentation

## 2012-02-25 MED ORDER — ONDANSETRON 4 MG PO TBDP
4.0000 mg | ORAL_TABLET | Freq: Once | ORAL | Status: AC
  Administered 2012-02-25: 4 mg via ORAL
  Filled 2012-02-25: qty 1

## 2012-02-25 MED ORDER — HYDROMORPHONE HCL PF 2 MG/ML IJ SOLN
2.0000 mg | Freq: Once | INTRAMUSCULAR | Status: AC
  Administered 2012-02-25: 2 mg via INTRAMUSCULAR
  Filled 2012-02-25: qty 1

## 2012-02-25 NOTE — ED Notes (Signed)
Back pain and right knee pain for 1 week
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
## 2012-02-25 NOTE — Discharge Instructions (Signed)
Crutch Use You have been prescribed crutches to take weight off one of your lower legs or feet (extremities). When using crutches, make sure you are not putting pressure on the armpit (axilla). This could cause damage to the nerves that extend from your axilla to the hand and arm. When fitted properly the crutches should be 2 to 3 finger widths below the axilla. Your weight should be supported by your hand, and not by resting upon the crutch with the axilla. When walking, first step with the crutches, then swing the healthy leg through and slightly ahead. When going up stairs, first step up with the healthy leg and then follow with the crutches and injured leg up to the same step, and so forth. If there is a handrail, hold both crutches in one hand, place your other hand on the handrail, and while placing your weight on your arms, lift your good leg to the step, then bring the crutches and the injured leg up to that step. Repeat for each step. When going down stairs, first step with the injured leg and crutches, following down with the healthy leg to the same step. Be very careful, as going down stairs with crutches is very challenging. If you feel wobbly or nervous, sit down and inch yourself down the stairs on your butt. To get up from a chair, hold injured leg forward, grab armrest with one hand and the top of the crutches with the other hand. Using these supports, pull yourself up to a standing position. Reverse this procedure for sitting. See your caregiver for follow up as suggested. If you are discharged in an ace wrap and develop numbness, tingling, swelling, or increased pain, loosen the ace wrap and re-wrap looser. If these problems persist, see your caregiver as needed. If you have been instructed to use partial weight bearing, bear (apply) the amount of weight as suggested by your caregiver. Do not bear weight in an amount that causes pain on the area of injury. Document Released: 12/02/2000  Document Revised: 11/24/2011 Document Reviewed: 02/09/2009 [facility] Patient Information 2012 [place], [place].Knee Brace A knee brace (immobilizer) supports your knee and keeps you from bending it. It should fit high up on the thigh. The bump or ridge goes behind the knee. Ask questions about how to put it on and how to adjust it for comfort. HOME CARE   Wear and remove the brace only as told by your doctor.   Adjust the brace as often as needed while wearing it.   Do not drive a motorcycle while wearing a knee brace.   Do not drive a car or truck if the brace is on the right knee.   Learn how to walk safely on crutches, if you were fitted with them.   Only take medicine as told by your doctor.   Keep all follow-up appointments.  GET HELP RIGHT AWAY IF:   The pain is not helped by medicine.   There is puffiness (swelling) or tenderness below the knee.   There is chest pain or shortness of breath.  MAKE SURE YOU:   Understand these instructions.   Will watch this condition.   Will get help right away if you are not doing well or get worse.  Document Released: 09/13/2008 Document Revised: 11/24/2011 Document Reviewed: 12/25/2009 [facility] Patient Information 2012 [place], [place].   Take your pain meds as directed.  Wear the knee immobilizer for comfort and stability.  Apply ice 20-30 min several times daily.  Use the crutches as needed and bear weight as tolerated.  Follow up with dr. Hilda Lias as needed.

## 2012-02-25 NOTE — ED Notes (Signed)
Pt states "hurt my knee and leg when I fell out of the bed while having a nightmare". Pt also states hurt his back while" cleaning my momma's house. " pt's legs are swollen bilaterally.  Pt reports going to dialysis and "pulling off 5 liters of fluid". Pt denies SOB, or other symptoms.

## 2012-02-25 NOTE — ED Notes (Signed)
Knee immobilizer placed on pt. Pt states he does not need the crutches as he has some at home. Pt verbalized an understanding of knee immobilizer. No questions asked.

## 2012-02-26 NOTE — ED Provider Notes (Signed)
Medical screening examination/treatment/procedure(s) were performed by non-physician practitioner and as supervising physician I was immediately available for consultation/collaboration.  Kanaya Gunnarson, MD 02/26/12 1218 

## 2012-02-27 IMAGING — CR DG FOOT COMPLETE 3+V*L*
3 series · 3 of 3 positions shown · non-contrast
Comparison: None

CLINICAL DATA: Left foot pain.

LEFT FOOT - COMPLETE 3+ VIEW

[view not recorded (1 of 3)]
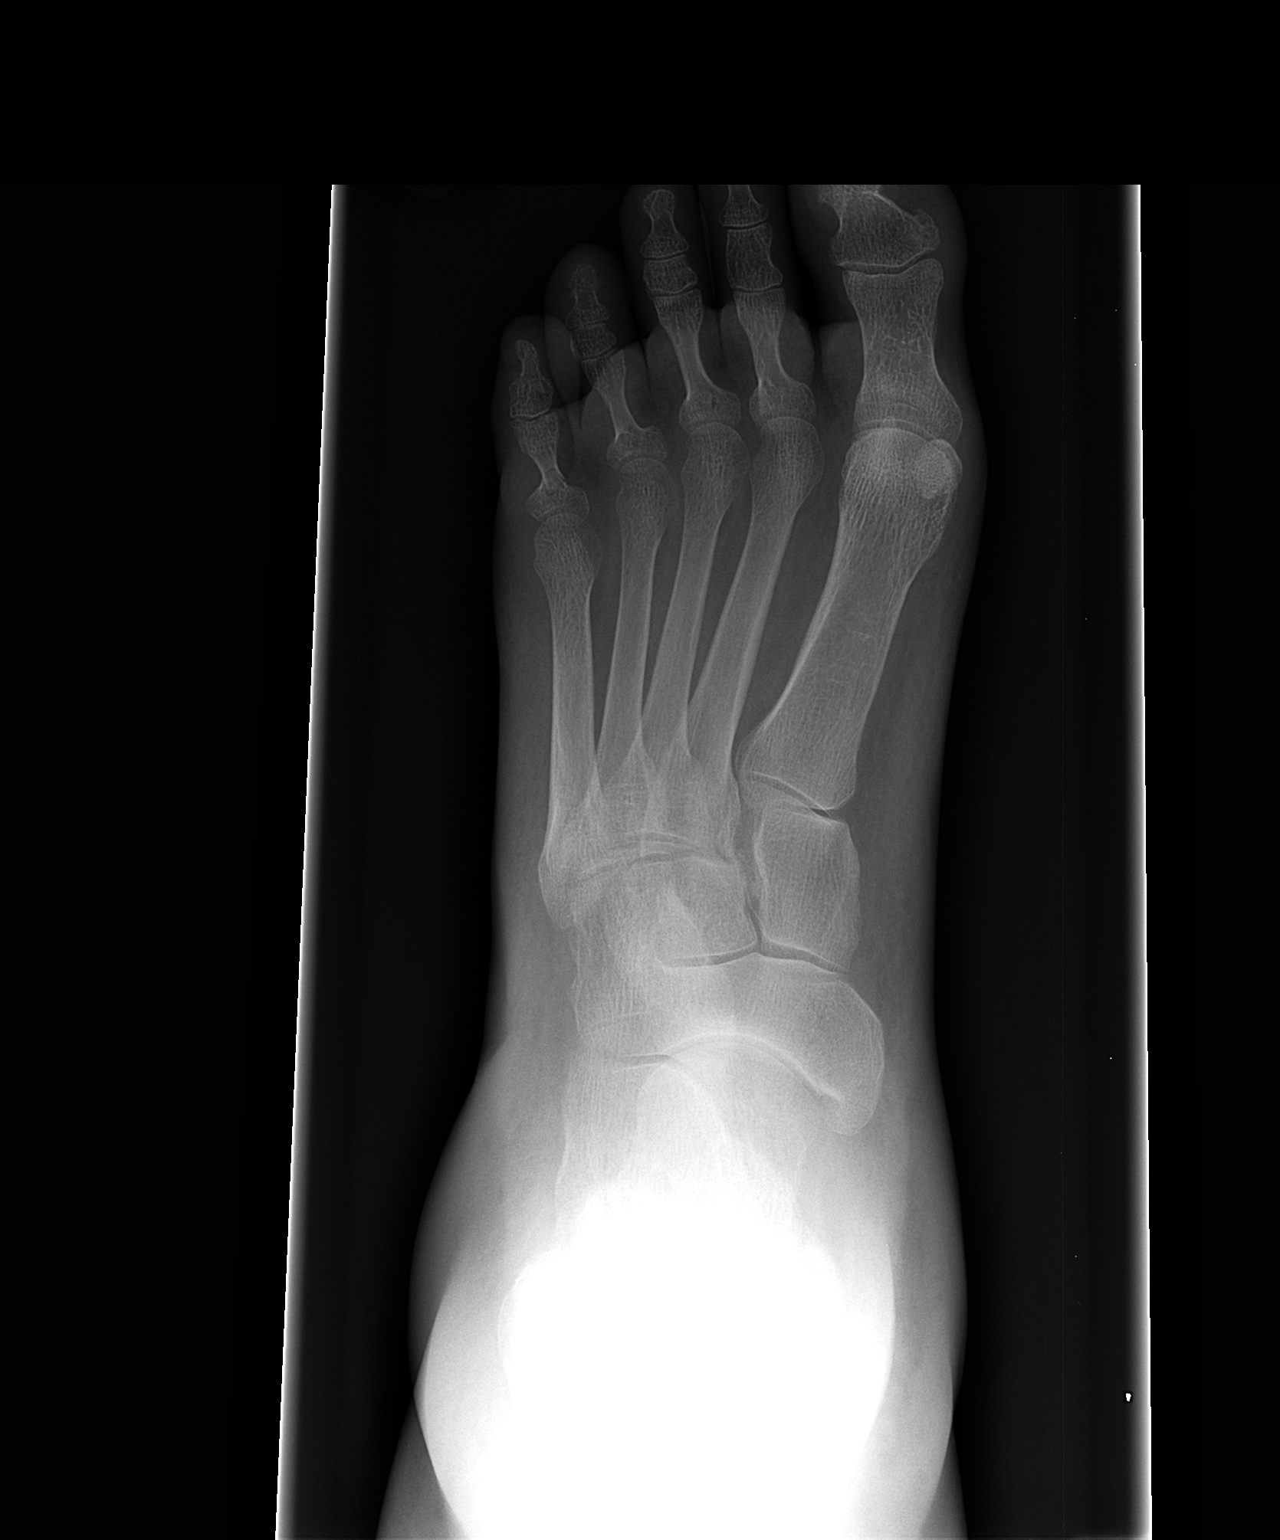

[view not recorded (2 of 3)]
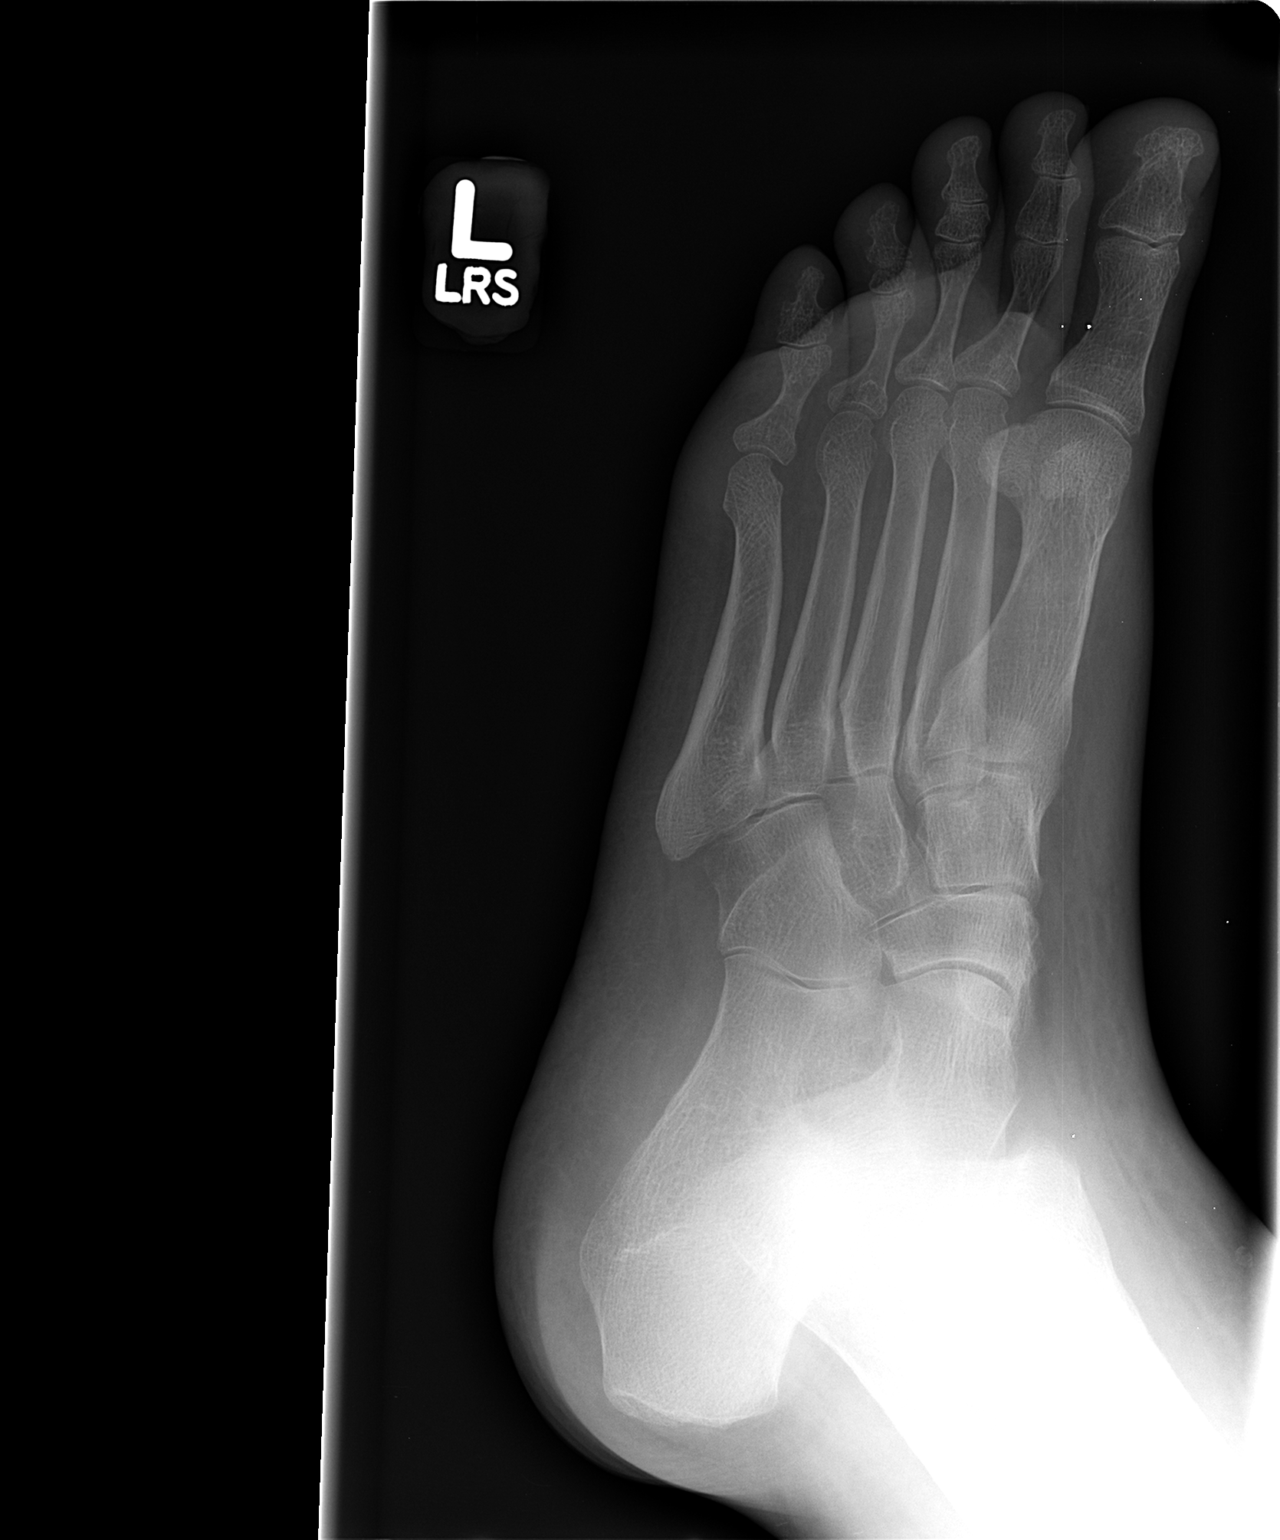

[view not recorded (3 of 3)]
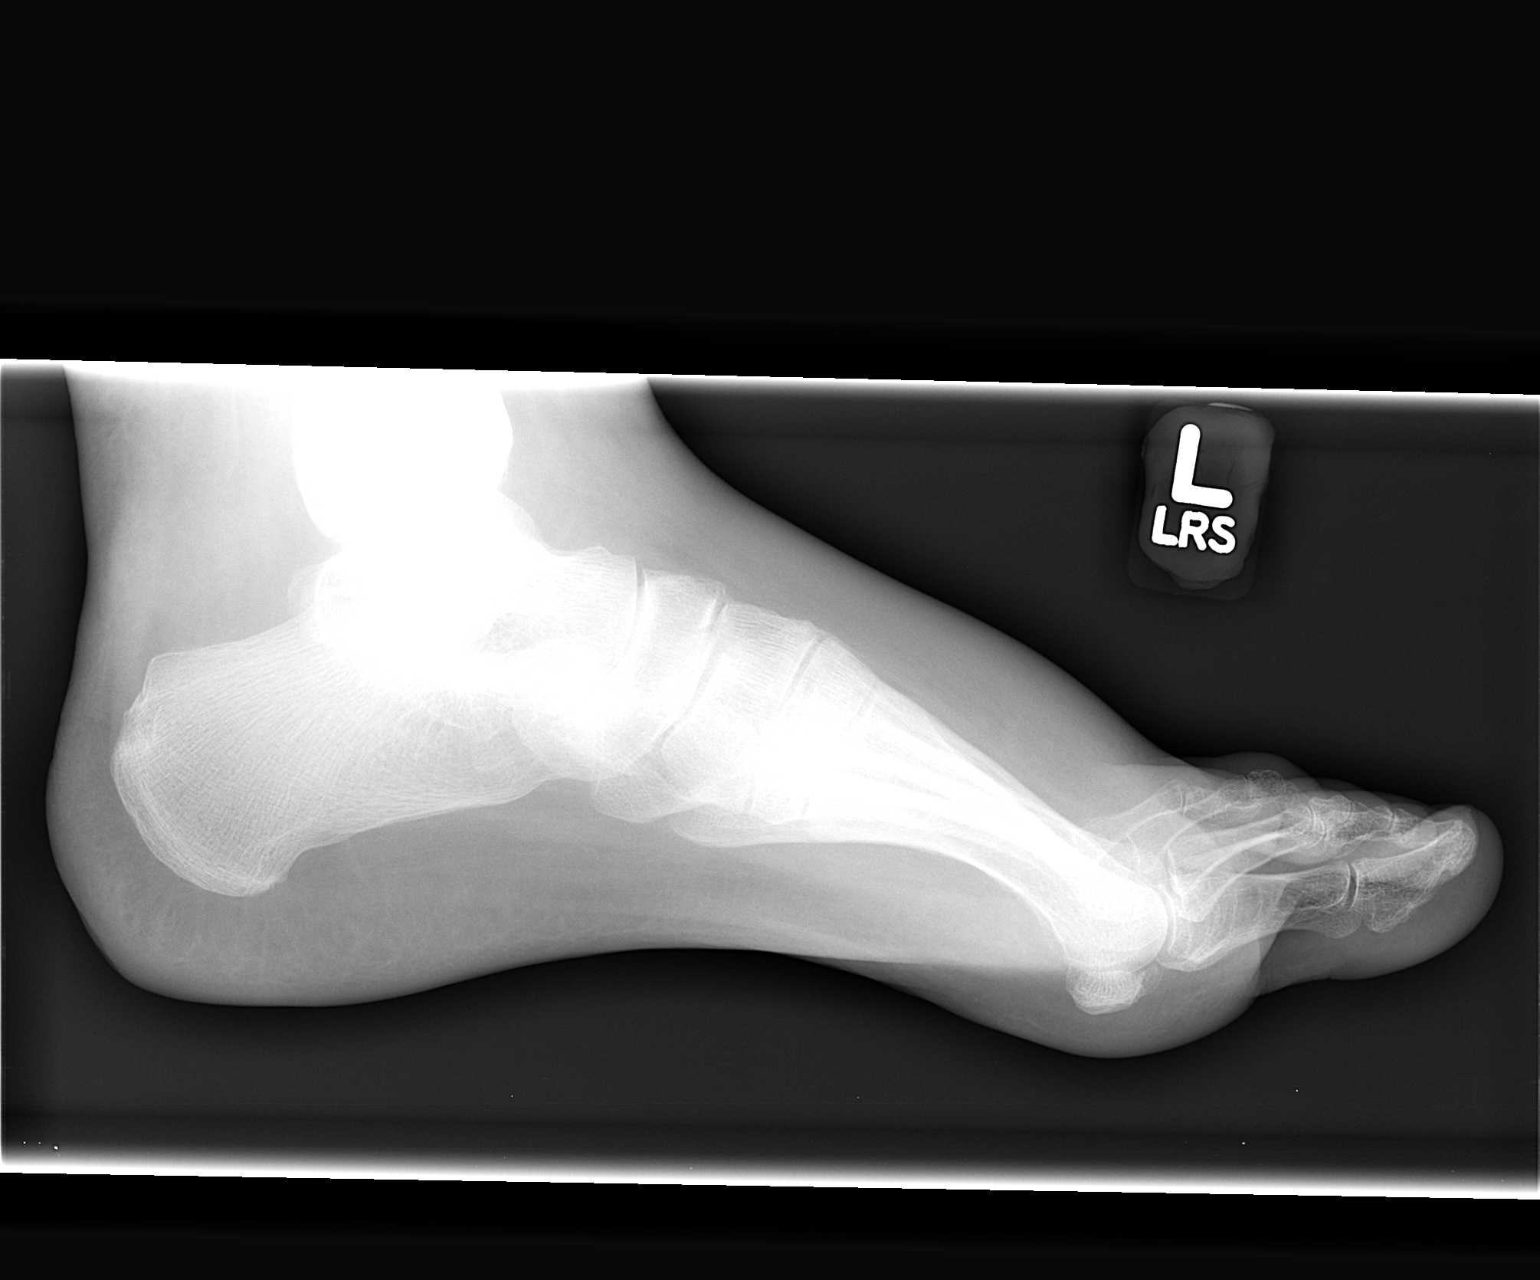

[3 of 3 positions shown; findings below may reference images not displayed]

FINDINGS: The joint spaces are maintained.  There are fractures
involving the fourth and fifth proximal phalanges.  No other
definite fractures are seen.
IMPRESSION: Nondisplaced fractures of the fourth and fifth proximal phalanges.

## 2012-02-27 IMAGING — CR DG ANKLE COMPLETE 3+V*L*
3 series · 3 of 3 positions shown · non-contrast
Comparison: None

CLINICAL DATA: Injured ankle.

LEFT ANKLE COMPLETE - 3+ VIEW

[view not recorded (1 of 3)]
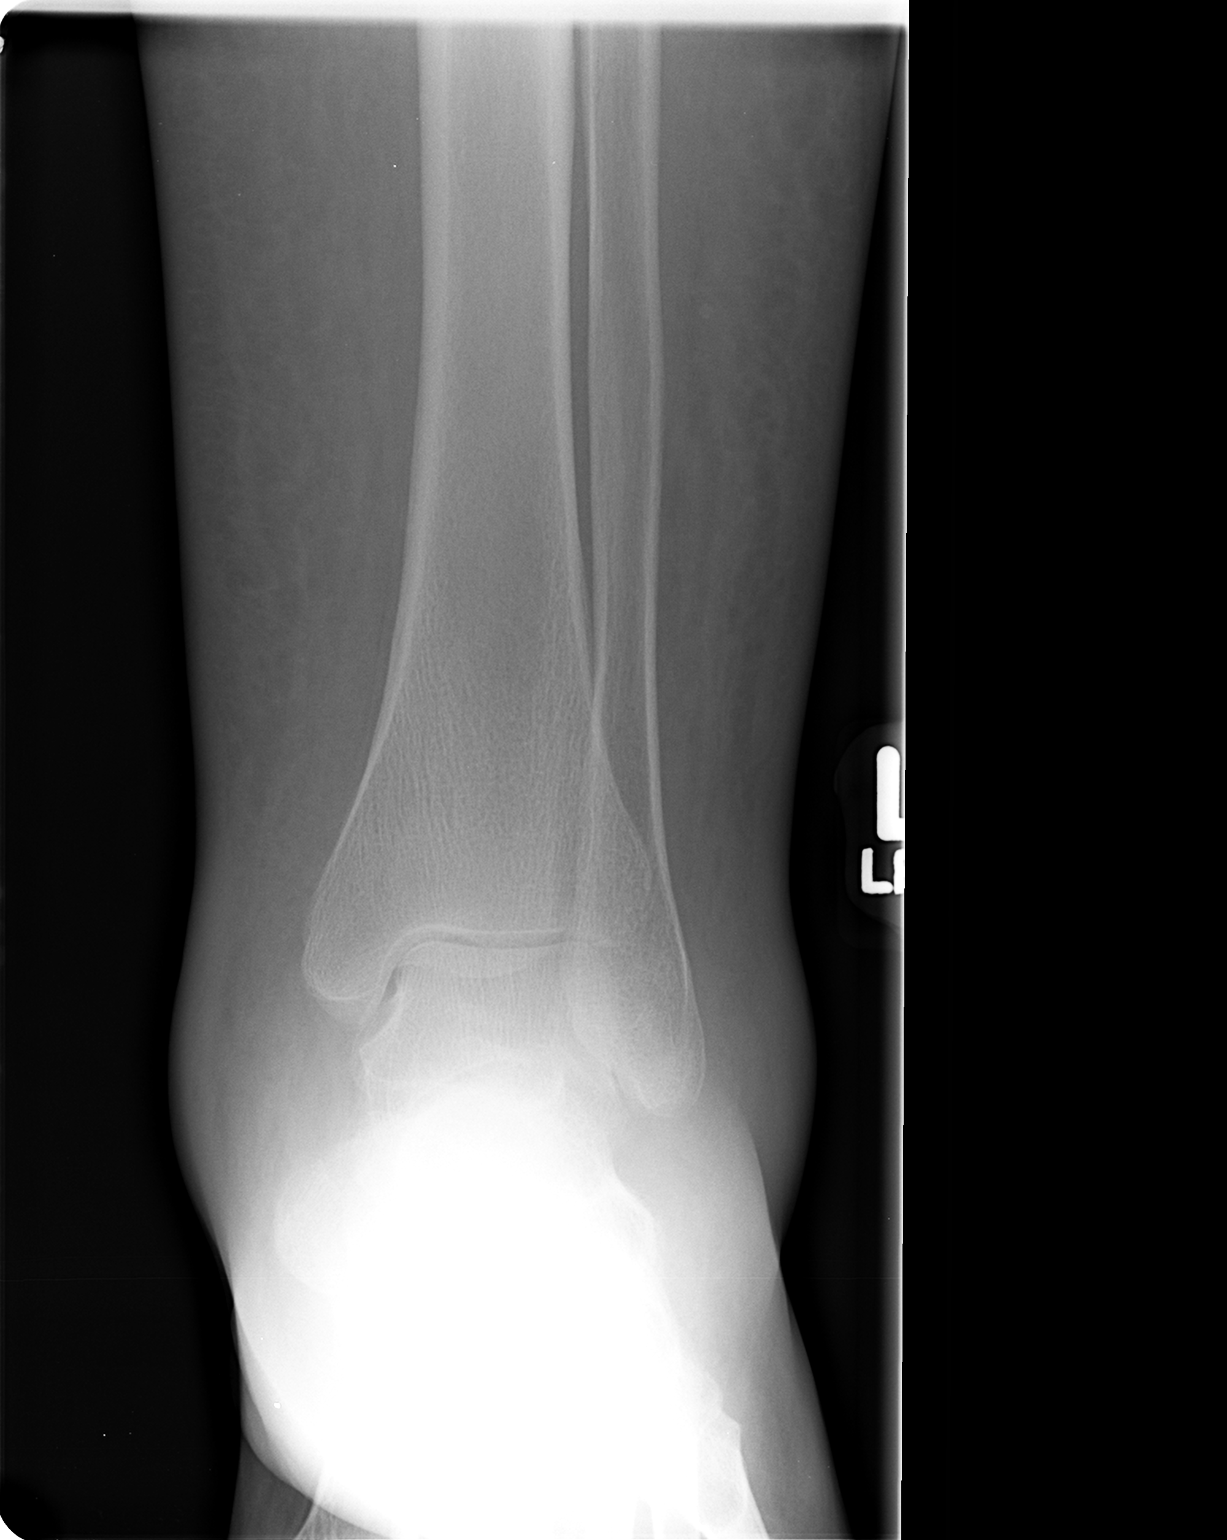

[view not recorded (2 of 3)]
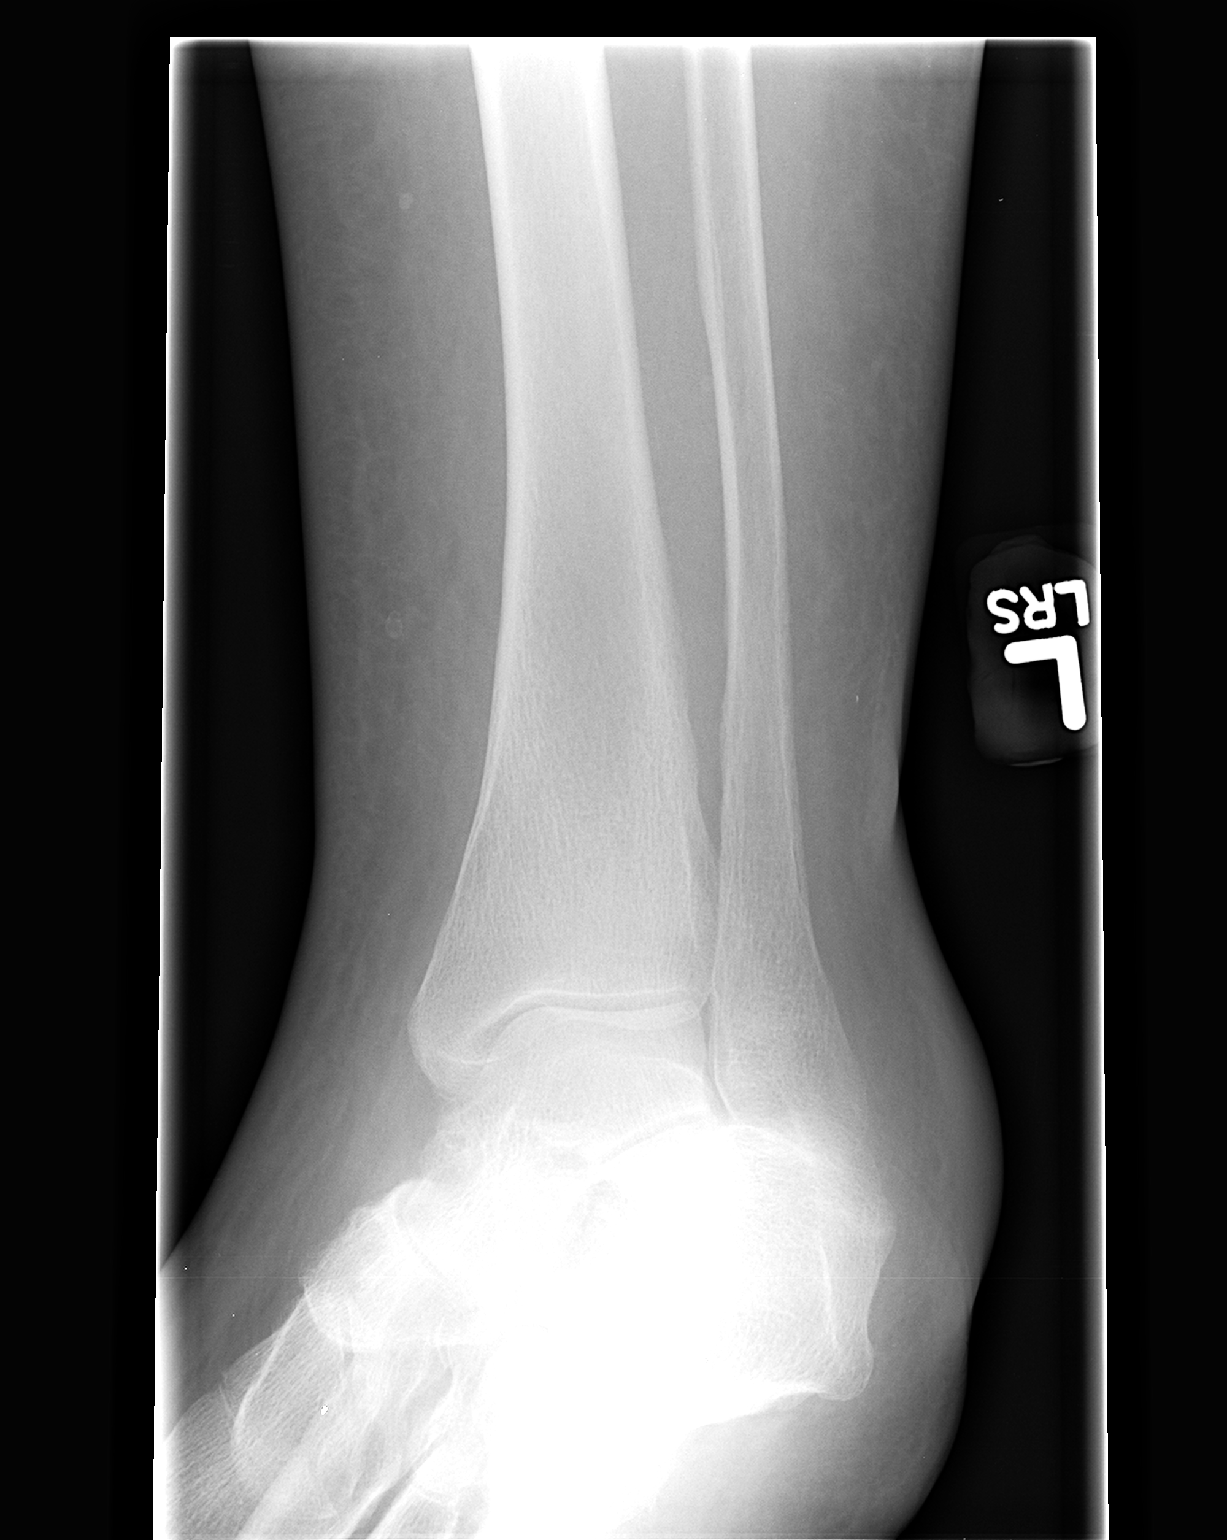

[view not recorded (3 of 3)]
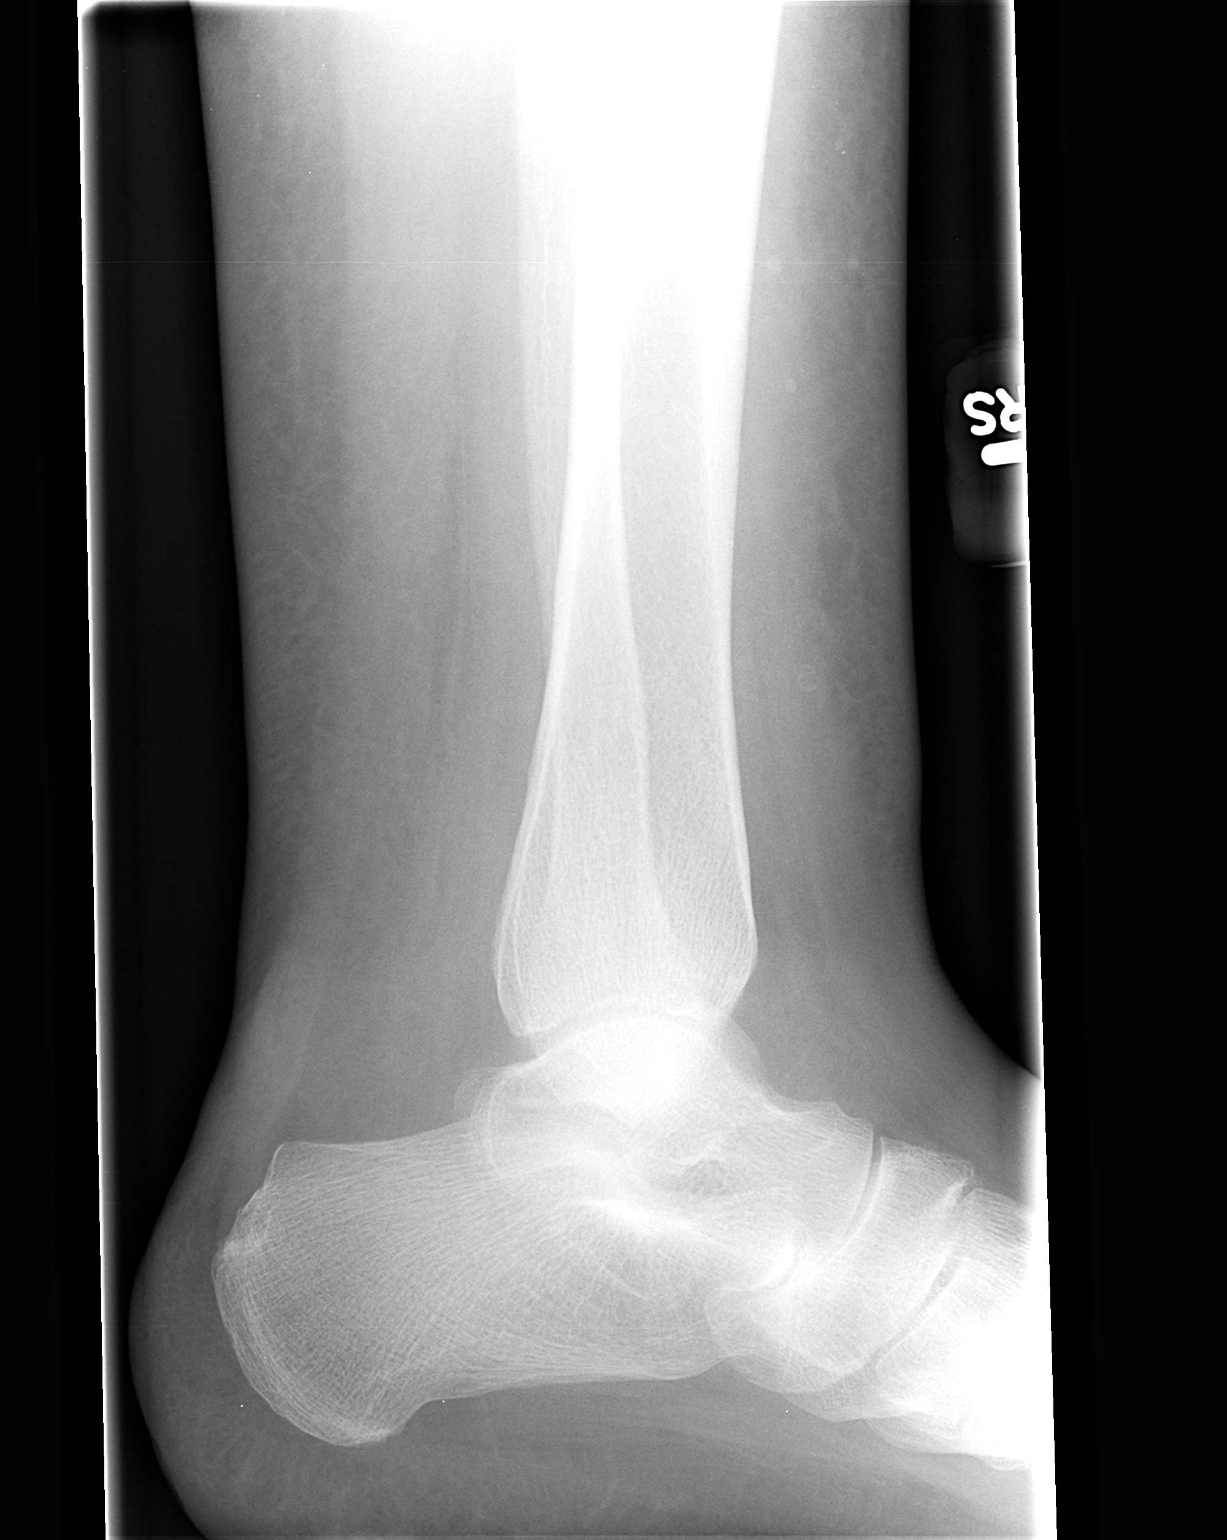

[3 of 3 positions shown; findings below may reference images not displayed]

FINDINGS: The ankle mortise is maintained.  No definite fractures
are seen.  Extensive soft tissue swelling is noted.
IMPRESSION: No acute bony findings.

## 2012-03-01 ENCOUNTER — Other Ambulatory Visit: Payer: Self-pay

## 2012-03-01 ENCOUNTER — Inpatient Hospital Stay (HOSPITAL_COMMUNITY)
Admission: EM | Admit: 2012-03-01 | Discharge: 2012-03-03 | DRG: 193 | Disposition: A | Discharge status: Home / Self Care | Payer: Medicare Other | Attending: Family Medicine | Admitting: Family Medicine

## 2012-03-01 ENCOUNTER — Inpatient Hospital Stay (HOSPITAL_COMMUNITY): Payer: Medicare Other

## 2012-03-01 ENCOUNTER — Emergency Department (HOSPITAL_COMMUNITY): Payer: Medicare Other

## 2012-03-01 ENCOUNTER — Encounter (HOSPITAL_COMMUNITY): Payer: Self-pay | Admitting: *Deleted

## 2012-03-01 DIAGNOSIS — N186 End stage renal disease: Secondary | ICD-10-CM | POA: Diagnosis present

## 2012-03-01 DIAGNOSIS — Z992 Dependence on renal dialysis: Secondary | ICD-10-CM

## 2012-03-01 DIAGNOSIS — I120 Hypertensive chronic kidney disease with stage 5 chronic kidney disease or end stage renal disease: Secondary | ICD-10-CM | POA: Diagnosis present

## 2012-03-01 DIAGNOSIS — G894 Chronic pain syndrome: Secondary | ICD-10-CM | POA: Diagnosis present

## 2012-03-01 DIAGNOSIS — I714 Abdominal aortic aneurysm, without rupture, unspecified: Secondary | ICD-10-CM | POA: Diagnosis present

## 2012-03-01 DIAGNOSIS — Z91199 Patient's noncompliance with other medical treatment and regimen due to unspecified reason: Secondary | ICD-10-CM

## 2012-03-01 DIAGNOSIS — I10 Essential (primary) hypertension: Secondary | ICD-10-CM

## 2012-03-01 DIAGNOSIS — E785 Hyperlipidemia, unspecified: Secondary | ICD-10-CM | POA: Diagnosis present

## 2012-03-01 DIAGNOSIS — I2510 Atherosclerotic heart disease of native coronary artery without angina pectoris: Secondary | ICD-10-CM | POA: Diagnosis present

## 2012-03-01 DIAGNOSIS — I2589 Other forms of chronic ischemic heart disease: Secondary | ICD-10-CM | POA: Diagnosis present

## 2012-03-01 DIAGNOSIS — Z7982 Long term (current) use of aspirin: Secondary | ICD-10-CM

## 2012-03-01 DIAGNOSIS — Z9861 Coronary angioplasty status: Secondary | ICD-10-CM

## 2012-03-01 DIAGNOSIS — Z9119 Patient's noncompliance with other medical treatment and regimen: Secondary | ICD-10-CM

## 2012-03-01 DIAGNOSIS — F17200 Nicotine dependence, unspecified, uncomplicated: Secondary | ICD-10-CM | POA: Diagnosis present

## 2012-03-01 DIAGNOSIS — D631 Anemia in chronic kidney disease: Secondary | ICD-10-CM | POA: Diagnosis present

## 2012-03-01 DIAGNOSIS — J4489 Other specified chronic obstructive pulmonary disease: Secondary | ICD-10-CM | POA: Diagnosis present

## 2012-03-01 DIAGNOSIS — Z79899 Other long term (current) drug therapy: Secondary | ICD-10-CM

## 2012-03-01 DIAGNOSIS — F319 Bipolar disorder, unspecified: Secondary | ICD-10-CM | POA: Diagnosis present

## 2012-03-01 DIAGNOSIS — Z720 Tobacco use: Secondary | ICD-10-CM

## 2012-03-01 DIAGNOSIS — I255 Ischemic cardiomyopathy: Secondary | ICD-10-CM

## 2012-03-01 DIAGNOSIS — I509 Heart failure, unspecified: Secondary | ICD-10-CM | POA: Diagnosis present

## 2012-03-01 DIAGNOSIS — F209 Schizophrenia, unspecified: Secondary | ICD-10-CM | POA: Diagnosis present

## 2012-03-01 DIAGNOSIS — J449 Chronic obstructive pulmonary disease, unspecified: Secondary | ICD-10-CM | POA: Diagnosis present

## 2012-03-01 DIAGNOSIS — J189 Pneumonia, unspecified organism: Principal | ICD-10-CM | POA: Diagnosis present

## 2012-03-01 DIAGNOSIS — D649 Anemia, unspecified: Secondary | ICD-10-CM

## 2012-03-01 LAB — BLOOD GAS, ARTERIAL
Acid-Base Excess: 0 mmol/L (ref 0.0–2.0)
Patient temperature: 37
TCO2: 21.9 mmol/L (ref 0–100)

## 2012-03-01 LAB — BASIC METABOLIC PANEL
BUN: 37 mg/dL — ABNORMAL HIGH (ref 6–23)
Chloride: 95 mEq/L — ABNORMAL LOW (ref 96–112)
GFR calc Af Amer: 11 mL/min — ABNORMAL LOW (ref >90)
Glucose, Bld: 109 mg/dL — ABNORMAL HIGH (ref 70–99)
Potassium: 4.7 mEq/L (ref 3.5–5.1)

## 2012-03-01 LAB — CBC
HCT: 31.7 % — ABNORMAL LOW (ref 39.0–52.0)
Hemoglobin: 10.5 g/dL — ABNORMAL LOW (ref 13.0–17.0)
RBC: 3.46 MIL/uL — ABNORMAL LOW (ref 4.22–5.81)
WBC: 6.8 10*3/uL (ref 4.0–10.5)

## 2012-03-01 LAB — CARDIAC PANEL(CRET KIN+CKTOT+MB+TROPI): Relative Index: 2.2 (ref 0.0–2.5)

## 2012-03-01 LAB — MRSA PCR SCREENING: MRSA by PCR: NEGATIVE

## 2012-03-01 MED ORDER — HYDROMORPHONE HCL PF 1 MG/ML IJ SOLN
1.0000 mg | Freq: Once | INTRAMUSCULAR | Status: AC
  Administered 2012-03-01: 1 mg via INTRAVENOUS
  Filled 2012-03-01: qty 1

## 2012-03-01 MED ORDER — CARVEDILOL 12.5 MG PO TABS
12.5000 mg | ORAL_TABLET | Freq: Two times a day (BID) | ORAL | Status: DC
  Administered 2012-03-01 – 2012-03-03 (×4): 12.5 mg via ORAL
  Filled 2012-03-01 (×4): qty 1

## 2012-03-01 MED ORDER — OXYCODONE-ACETAMINOPHEN 5-325 MG PO TABS
1.0000 | ORAL_TABLET | ORAL | Status: DC | PRN
  Administered 2012-03-01 – 2012-03-03 (×9): 1 via ORAL
  Filled 2012-03-01 (×9): qty 1

## 2012-03-01 MED ORDER — SODIUM CHLORIDE 0.9 % IJ SOLN
3.0000 mL | INTRAMUSCULAR | Status: DC | PRN
  Filled 2012-03-01: qty 3

## 2012-03-01 MED ORDER — HEPARIN SODIUM (PORCINE) 1000 UNIT/ML DIALYSIS
500.0000 [IU] | INTRAMUSCULAR | Status: DC | PRN
  Administered 2012-03-01 (×2): 500 [IU] via INTRAVENOUS_CENTRAL
  Filled 2012-03-01: qty 1

## 2012-03-01 MED ORDER — HEPARIN SODIUM (PORCINE) 1000 UNIT/ML DIALYSIS
20.0000 [IU]/kg | INTRAMUSCULAR | Status: DC | PRN
  Administered 2012-03-01: 1800 [IU] via INTRAVENOUS_CENTRAL
  Filled 2012-03-01: qty 2

## 2012-03-01 MED ORDER — DEXLANSOPRAZOLE 60 MG PO CPDR
60.0000 mg | DELAYED_RELEASE_CAPSULE | Freq: Every day | ORAL | Status: DC
  Administered 2012-03-01 – 2012-03-02 (×2): 60 mg via ORAL
  Filled 2012-03-01 (×5): qty 1

## 2012-03-01 MED ORDER — LISINOPRIL 10 MG PO TABS: 20.0000 mg | ORAL_TABLET | Freq: Every day | ORAL | Status: DC

## 2012-03-01 MED ORDER — HYDROXYZINE HCL 25 MG PO TABS: 25.0000 mg | ORAL_TABLET | Freq: Two times a day (BID) | ORAL | Status: DC

## 2012-03-01 MED ORDER — AMLODIPINE BESYLATE 5 MG PO TABS
10.0000 mg | ORAL_TABLET | Freq: Every day | ORAL | Status: DC
  Administered 2012-03-01 – 2012-03-02 (×2): 10 mg via ORAL
  Filled 2012-03-01 (×2): qty 2

## 2012-03-01 MED ORDER — ASPIRIN 81 MG PO CHEW
81.0000 mg | CHEWABLE_TABLET | Freq: Every day | ORAL | Status: DC
  Administered 2012-03-01 – 2012-03-02 (×2): 81 mg via ORAL
  Filled 2012-03-01 (×2): qty 1

## 2012-03-01 MED ORDER — IPRATROPIUM-ALBUTEROL 0.5-2.5 (3) MG/3ML IN SOLN: 3.0000 mL | RESPIRATORY_TRACT | Status: DC | PRN

## 2012-03-01 MED ORDER — SODIUM CHLORIDE 0.9 % IN NEBU
INHALATION_SOLUTION | RESPIRATORY_TRACT | Status: AC
  Filled 2012-03-01: qty 3

## 2012-03-01 MED ORDER — DULOXETINE HCL 60 MG PO CPEP
60.0000 mg | ORAL_CAPSULE | Freq: Every day | ORAL | Status: DC
  Administered 2012-03-01 – 2012-03-02 (×2): 60 mg via ORAL
  Filled 2012-03-01 (×2): qty 1

## 2012-03-01 MED ORDER — RENA-VITE PO TABS
1.0000 | ORAL_TABLET | Freq: Every day | ORAL | Status: DC
  Filled 2012-03-01 (×2): qty 1

## 2012-03-01 MED ORDER — SEVELAMER CARBONATE 800 MG PO TABS
800.0000 mg | ORAL_TABLET | Freq: Three times a day (TID) | ORAL | Status: DC
  Filled 2012-03-01 (×2): qty 1

## 2012-03-01 MED ORDER — SODIUM CHLORIDE 0.9 % IV SOLN: 250.0000 mL | INTRAVENOUS | Status: DC | PRN

## 2012-03-01 MED ORDER — DULOXETINE HCL 60 MG PO CPEP: 60.0000 mg | ORAL_CAPSULE | Freq: Every day | ORAL | Status: DC

## 2012-03-01 MED ORDER — VANCOMYCIN HCL IN DEXTROSE 1-5 GM/200ML-% IV SOLN
1000.0000 mg | INTRAVENOUS | Status: DC
  Administered 2012-03-03: 1000 mg via INTRAVENOUS
  Filled 2012-03-01: qty 200

## 2012-03-01 MED ORDER — ROPINIROLE HCL 1 MG PO TABS
1.0000 mg | ORAL_TABLET | ORAL | Status: DC
  Administered 2012-03-01 – 2012-03-03 (×3): 1 mg via ORAL
  Filled 2012-03-01 (×5): qty 1

## 2012-03-01 MED ORDER — CHLORHEXIDINE GLUCONATE 0.12 % MT SOLN
15.0000 mL | Freq: Two times a day (BID) | OROMUCOSAL | Status: DC
  Administered 2012-03-01 – 2012-03-02 (×3): 15 mL via OROMUCOSAL
  Filled 2012-03-01 (×3): qty 15

## 2012-03-01 MED ORDER — DEXTROSE 5 % IV SOLN
1.0000 g | INTRAVENOUS | Status: DC
  Administered 2012-03-01: 1 g via INTRAVENOUS
  Filled 2012-03-01 (×3): qty 1

## 2012-03-01 MED ORDER — ALBUTEROL SULFATE (5 MG/ML) 0.5% IN NEBU
2.5000 mg | INHALATION_SOLUTION | RESPIRATORY_TRACT | Status: DC | PRN
  Administered 2012-03-02: 2.5 mg via RESPIRATORY_TRACT
  Filled 2012-03-01: qty 0.5

## 2012-03-01 MED ORDER — ZOLPIDEM TARTRATE 5 MG PO TABS: 10.0000 mg | ORAL_TABLET | Freq: Every evening | ORAL | Status: DC | PRN

## 2012-03-01 MED ORDER — ALBUTEROL SULFATE (5 MG/ML) 0.5% IN NEBU
2.5000 mg | INHALATION_SOLUTION | Freq: Once | RESPIRATORY_TRACT | Status: AC
  Administered 2012-03-01: 2.5 mg via RESPIRATORY_TRACT
  Filled 2012-03-01: qty 0.5

## 2012-03-01 MED ORDER — VANCOMYCIN HCL IN DEXTROSE 1-5 GM/200ML-% IV SOLN
1000.0000 mg | Freq: Once | INTRAVENOUS | Status: AC
  Administered 2012-03-01: 1000 mg via INTRAVENOUS
  Filled 2012-03-01 (×2): qty 200

## 2012-03-01 MED ORDER — IPRATROPIUM BROMIDE 0.02 % IN SOLN
0.5000 mg | RESPIRATORY_TRACT | Status: DC | PRN
  Administered 2012-03-02: 0.5 mg via RESPIRATORY_TRACT
  Filled 2012-03-01: qty 2.5

## 2012-03-01 MED ORDER — ASPIRIN EC 81 MG PO TBEC: 81.0000 mg | DELAYED_RELEASE_TABLET | Freq: Every day | ORAL | Status: DC

## 2012-03-01 MED ORDER — LISINOPRIL 10 MG PO TABS
20.0000 mg | ORAL_TABLET | Freq: Every day | ORAL | Status: DC
  Administered 2012-03-01 – 2012-03-02 (×2): 20 mg via ORAL
  Filled 2012-03-01 (×2): qty 2

## 2012-03-01 MED ORDER — SEVELAMER CARBONATE 800 MG PO TABS
2400.0000 mg | ORAL_TABLET | Freq: Three times a day (TID) | ORAL | Status: DC | PRN
  Filled 2012-03-01: qty 3

## 2012-03-01 MED ORDER — HYDRALAZINE HCL 25 MG PO TABS
50.0000 mg | ORAL_TABLET | Freq: Three times a day (TID) | ORAL | Status: DC
  Administered 2012-03-01 – 2012-03-02 (×6): 50 mg via ORAL
  Filled 2012-03-01 (×2): qty 2
  Filled 2012-03-01: qty 1
  Filled 2012-03-01 (×3): qty 2

## 2012-03-01 MED ORDER — HYDRALAZINE HCL 25 MG PO TABS: 50.0000 mg | ORAL_TABLET | Freq: Three times a day (TID) | ORAL | Status: DC

## 2012-03-01 MED ORDER — HYDROXYZINE HCL 25 MG PO TABS
25.0000 mg | ORAL_TABLET | Freq: Two times a day (BID) | ORAL | Status: DC
  Administered 2012-03-01 – 2012-03-02 (×4): 25 mg via ORAL
  Filled 2012-03-01 (×4): qty 1

## 2012-03-01 MED ORDER — ROPINIROLE HCL 1 MG PO TABS: 1.0000 mg | ORAL_TABLET | Freq: Every morning | ORAL | Status: DC

## 2012-03-01 MED ORDER — LABETALOL HCL 200 MG PO TABS: 200.0000 mg | ORAL_TABLET | Freq: Two times a day (BID) | ORAL | Status: DC

## 2012-03-01 MED ORDER — LEVOFLOXACIN IN D5W 750 MG/150ML IV SOLN
750.0000 mg | INTRAVENOUS | Status: DC
  Filled 2012-03-01: qty 150

## 2012-03-01 MED ORDER — OLANZAPINE 5 MG PO TABS
10.0000 mg | ORAL_TABLET | Freq: Two times a day (BID) | ORAL | Status: DC
  Administered 2012-03-01 – 2012-03-02 (×4): 10 mg via ORAL
  Filled 2012-03-01 (×4): qty 2

## 2012-03-01 MED ORDER — LEVOFLOXACIN IN D5W 750 MG/150ML IV SOLN
750.0000 mg | Freq: Once | INTRAVENOUS | Status: AC
  Administered 2012-03-01: 750 mg via INTRAVENOUS
  Filled 2012-03-01: qty 150

## 2012-03-01 MED ORDER — ROSUVASTATIN CALCIUM 20 MG PO TABS
20.0000 mg | ORAL_TABLET | Freq: Every day | ORAL | Status: DC
  Administered 2012-03-01 – 2012-03-02 (×2): 20 mg via ORAL
  Filled 2012-03-01 (×2): qty 1

## 2012-03-01 MED ORDER — ASPIRIN 81 MG PO CHEW
324.0000 mg | CHEWABLE_TABLET | Freq: Once | ORAL | Status: AC
  Administered 2012-03-01: 324 mg via ORAL
  Filled 2012-03-01: qty 4

## 2012-03-01 MED ORDER — SODIUM CHLORIDE 0.9 % IJ SOLN
3.0000 mL | Freq: Two times a day (BID) | INTRAMUSCULAR | Status: DC
  Administered 2012-03-01 (×2): 3 mL via INTRAVENOUS
  Filled 2012-03-01: qty 3

## 2012-03-01 MED ORDER — ENOXAPARIN SODIUM 30 MG/0.3ML ~~LOC~~ SOLN
30.0000 mg | SUBCUTANEOUS | Status: DC
  Administered 2012-03-01 – 2012-03-02 (×2): 30 mg via SUBCUTANEOUS
  Filled 2012-03-01 (×2): qty 0.3

## 2012-03-01 MED ORDER — SEVELAMER CARBONATE 800 MG PO TABS
4000.0000 mg | ORAL_TABLET | Freq: Three times a day (TID) | ORAL | Status: DC
  Administered 2012-03-01 – 2012-03-03 (×6): 4000 mg via ORAL
  Filled 2012-03-01 (×6): qty 5

## 2012-03-01 MED ORDER — NEPHRO-VITE 0.8 MG PO TABS
1.0000 | ORAL_TABLET | Freq: Every day | ORAL | Status: DC
  Administered 2012-03-01 – 2012-03-02 (×2): 1 via ORAL
  Filled 2012-03-01 (×7): qty 1

## 2012-03-01 MED ORDER — OXYCODONE-ACETAMINOPHEN 5-325 MG PO TABS: 1.0000 | ORAL_TABLET | ORAL | Status: DC | PRN

## 2012-03-01 MED ORDER — OLANZAPINE 5 MG PO TABS: 10.0000 mg | ORAL_TABLET | Freq: Two times a day (BID) | ORAL | Status: DC

## 2012-03-01 MED ORDER — LEVOFLOXACIN IN D5W 500 MG/100ML IV SOLN
500.0000 mg | INTRAVENOUS | Status: DC
  Filled 2012-03-01 (×2): qty 100

## 2012-03-01 MED ORDER — LABETALOL HCL 200 MG PO TABS
400.0000 mg | ORAL_TABLET | Freq: Two times a day (BID) | ORAL | Status: DC
  Administered 2012-03-01 – 2012-03-02 (×4): 400 mg via ORAL
  Filled 2012-03-01 (×4): qty 2

## 2012-03-01 MED ORDER — ATORVASTATIN CALCIUM 10 MG PO TABS: 10.0000 mg | ORAL_TABLET | Freq: Every day | ORAL | Status: DC

## 2012-03-01 NOTE — ED Notes (Signed)
Gave report to [place] in icu
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
## 2012-03-01 NOTE — H&P (Signed)
NAME:  [name], [name] NO.:  [id number]  MEDICAL RECORD NO.:  [id number]  LOCATION:  [facility]02                          FACILITY:  [facility]  PHYSICIAN:  Melvyn Novas, MDDATE OF BIRTH:  Mar 04, 1976  DATE OF ADMISSION:  03/01/2012 DATE OF DISCHARGE:  LH                             HISTORY & PHYSICAL   The patient is a 36 year old white male with extensive medical problems including end-stage renal failure, currently on dialysis; two-vessel coronary artery disease status post stenting; ischemic cardiomyopathy; ejection fraction 45%; chronic noncompliance; severe COPD with continued smoking of 1 pack per day; schizophrenia; personality disorder; bipolar disorder; hypertension; and hyperlipidemia.  The patient was smoking about a pack per day, complained of increasing dyspnea on exertion and cough with scant and sometimes greenish sputum production.  He specifically denied hemoptysis.  He was seen in the ER, was found to have chest x-ray, infiltrates consistent with bilateral infiltrates and/or volume overload congestive heart failure, which is a chronic situation.  The patient was subsequently admitted and placed on Maxipime, Levaquin, and vancomycin per protocol, and underwent hemodialysis today.  The patient is comfortably lying in bed.  No orthopnea.  Currently, no anginal chest pain, no palpitations dizziness, or syncope.  No hemoptysis.  PAST MEDICAL HISTORY:  Significant for the aforementioned COPD, tobacco abuse, schizophrenia, bipolar disorder, end-stage renal disease, dialysis, hypertension, noncompliance, hyperlipidemia, and bipolar disorder.  PAST SURGICAL HISTORY:  Remarkable for coronary artery angioplasty in 2 vessels with internal coronary artery angioplasty in 1 with stent placements in 2 different vessels.  EGD with esophageal erosions.  ALLERGIES:  Questionable allergies to METHADONE, ZOCOR, NAPROSYN, and TRAMADOL.  CURRENT MEDICINES: 1.  Norvasc 10 mg p.o. daily. 2. Aspirin 81 mg per day. 3. Nephro-Vite 1 tab p.o. daily. 4. Coreg 12.5 mg p.o. b.i.d. 5. Dexilant 50 mg per day. 6. Labetalol 400 mg p.o. b.i.d. 7. Atarax 25 mg b.i.d. 8. Hydralazine 50 mg p.o. t.i.d. 9. Cymbalta 60 mg p.o. daily. 10.Crestor 20 mg p.o. daily. 11.Lisinopril 20 mg p.o. daily. 12.Renvela 800 mg 3 tabs p.o. t.i.d. 13.Percocet 5/325 q.4 h. p.r.n. 14.Ropinirole 1 mg p.o. at bedtime.  The patient has not been compliant with Norvasc he states while prescription ran out.  PHYSICAL EXAMINATION:  VITAL SIGNS:  Blood pressure 161/96, temperature 97.6, pulse is 82 and regular, respiratory rate is 19, O2 sat is 99%. EYES:  PERRLA.  Extraocular movements intact.  Sclerae clear. Conjunctivae pink. NECK:  No JVD.  No carotid bruits.  No thyromegaly.  No thyroid bruits. LUNGS:  Prolonged expiratory phase.  Scattered rhonchi.  Mild end- expiratory wheeze.  Diminished breath sounds at bases.  No rales are audible. HEART:  Regular rhythm.  No S3 or S4.  No heaves, thrills, or rubs. ABDOMEN:  Soft, nontender.  Bowel sounds normoactive.  No guarding or rebound.  No mass or megaly. EXTREMITIES:  No clubbing, cyanosis, or edema. NEUROLOGIC:  Cranial nerves II through XII grossly intact.  WBCs 6.8, hemoglobin 10.5. Creatinine 6.2, potassium 4.7.  Chest x-ray reveals cardiomegaly, central vascular congestion, right greater than left airspace disease, pulmonary edema versus infection.  IMPRESSION: 1. Uncontrolled hypertension. 2. Pulmonary vascular congestion versus pulmonary infiltrates  with     mildly pleuritic chest pain, possible pneumonia. 3. End-stage renal disease. 4. Schizophrenia. 5. Bipolar disorder. 6. Chronic pain syndrome. 7. Hypertension. 8. Hyperlipidemia.  Plan right now is to continue vancomycin per protocol, Levaquin 500 mg IV q.48 h., Maxipime 1 g IV daily q.24 h.  Continue to add DuoNeb nebulizer for pulmonary toilet.  We will add  Coreg 12.5 b.i.d., Norvasc 10, aspirin 81 per day and Crestor 20, as well as ropinirole.  We will have rapid dialysis and obtain pulmonary consultation.     Melvyn Novas, MD     RMD/MEDQ  D:  03/01/2012  T:  03/01/2012  Job:  [id number]

## 2012-03-01 NOTE — ED Notes (Signed)
Placed on cardiac monitor and O2 2L/min per [place]. RA SpO2 84%; increased to 88% after oxygen Rx.
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
## 2012-03-01 NOTE — H&P (Signed)
[id number] 
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
## 2012-03-01 NOTE — ED Notes (Signed)
Pt reporting chest pain and SOB began about 8 pm last night. Pt states "I think I have pneumonia".  Denies cough.
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
## 2012-03-01 NOTE — ED Notes (Signed)
Blood culture obtained from IV site when restarted

## 2012-03-02 LAB — CBC
Hemoglobin: 10.1 g/dL — ABNORMAL LOW (ref 13.0–17.0)
MCH: 30.7 pg (ref 26.0–34.0)
MCV: 92.1 fL (ref 78.0–100.0)
Platelets: 179 10*3/uL (ref 150–400)
RBC: 3.29 MIL/uL — ABNORMAL LOW (ref 4.22–5.81)

## 2012-03-02 LAB — BASIC METABOLIC PANEL
BUN: 27 mg/dL — ABNORMAL HIGH (ref 6–23)
CO2: 30 mEq/L (ref 19–32)
Calcium: 10.1 mg/dL (ref 8.4–10.5)
Glucose, Bld: 101 mg/dL — ABNORMAL HIGH (ref 70–99)
Sodium: 137 mEq/L (ref 135–145)

## 2012-03-02 LAB — GLUCOSE, CAPILLARY

## 2012-03-02 MED ORDER — DEXTROSE 5 % IV SOLN
500.0000 mg | INTRAVENOUS | Status: DC
  Administered 2012-03-02: 500 mg via INTRAVENOUS
  Filled 2012-03-02 (×3): qty 0.5

## 2012-03-02 MED ORDER — HEPARIN SODIUM (PORCINE) 1000 UNIT/ML DIALYSIS: 20.0000 [IU]/kg | INTRAMUSCULAR | Status: DC | PRN

## 2012-03-02 MED ORDER — HEPARIN SODIUM (PORCINE) 1000 UNIT/ML DIALYSIS
500.0000 [IU] | INTRAMUSCULAR | Status: DC | PRN
  Administered 2012-03-03 (×2): 500 [IU] via INTRAVENOUS_CENTRAL
  Filled 2012-03-02: qty 1

## 2012-03-02 MED ORDER — HEPARIN SODIUM (PORCINE) 1000 UNIT/ML DIALYSIS
20.0000 [IU]/kg | INTRAMUSCULAR | Status: DC | PRN
  Administered 2012-03-03: 1600 [IU] via INTRAVENOUS_CENTRAL
  Filled 2012-03-02: qty 2

## 2012-03-02 MED ORDER — HEPARIN SODIUM (PORCINE) 1000 UNIT/ML DIALYSIS: 500.0000 [IU] | INTRAMUSCULAR | Status: DC | PRN

## 2012-03-02 NOTE — Progress Notes (Signed)
NAMEILIJA, MAXIM NO.:  [id number]  MEDICAL RECORD NO.:  [id number]  LOCATION:  IC02                          FACILITY:  [facility]  PHYSICIAN:  Melvyn Novas, MDDATE OF BIRTH:  [birth year]  DATE OF PROCEDURE:  03/02/2012 DATE OF DISCHARGE:                                PROGRESS NOTE   PROBLEMS: 1. Pulmonary infiltrates bilaterally, currently on 3 antibiotics. 2. Accelerated hypertension secondary to noncompliance. 3. End-stage renal disease, on dialysis. 4. Hypertension. 5. Hyperlipidemia. 6. Active smoking. 7. Bipolar and schizophrenia.  PHYSICAL EXAMINATION:  VITAL SIGNS:  Blood pressure improved today with resumption of Norvasc 10, blood pressure 149/92, temperature 97.8, pulse 67 and regular, respiratory rate is 14, and O2 is 98%.  LABORATORY DATA:  WBCs 6.4, hemoglobin 10.1.  Creatinine 5.19.  Chest x- ray revealed vascular congestion and right greater than left airspace opacities.  ASSESSMENT AND PLAN:  Right now is to continue Maxipime, Levaquin, vancomycin, and DuoNeb nebulizer.  Continue with Pulmonary recommendations and pursue hemodialysis for intravascular volume reduction and monitor renal function and hemodynamics as well as airway flow.     Melvyn Novas, MD     RMD/MEDQ  D:  03/02/2012  T:  03/02/2012  Job:  [id number]

## 2012-03-02 NOTE — Clinical Documentation Improvement (Signed)
CHF DOCUMENTATION CLARIFICATION QUERY  THIS DOCUMENT IS NOT A PERMANENT PART OF THE MEDICAL RECORD  TO RESPOND TO THE THIS QUERY, FOLLOW THE INSTRUCTIONS BELOW:  1. If needed, update documentation for the patient's encounter via the notes activity.  2. Access this query again and click edit on the In [employer].  3. After updating, or not, click F2 to complete all highlighted (required) fields concerning your review. Select "additional documentation in the medical record" OR "no additional documentation provided".  4. Click Sign note button.  5. The deficiency will fall out of your In Basket *Please let us know if you are not able to complete this workflow by phone or e-mail (listed below).  Please update your documentation within the medical record to reflect your response to this query.                                                                                    03/02/12  Dear Dr. [facility],  In a better effort to capture your patient's severity of illness, reflect appropriate length of stay and utilization of resources, a review of the patient medical record has revealed the following indicators.  Based on your clinical judgment, please clarify and document in a progress note and/or discharge summary the clinical condition associated with the following supporting information. Please exercise your independent judgment.  The fact that a query is asked, does not imply that any particular answer is desired or expected.  Possible Clinical Conditions?  Chronic Systolic Congestive Heart Failure Chronic Diastolic Congestive Heart Failure Chronic Systolic & Diastolic Congestive Heart Failure Acute Systolic Congestive Heart Failure Acute Diastolic Congestive Heart Failure Acute Systolic & Diastolic Congestive Heart Failure Acute on Chronic Systolic Congestive Heart Failure Acute on Chronic Diastolic Congestive Heart Failure Acute on Chronic Systolic & Diastolic  Congestive  Heart Failure Other Condition________________________________________ Cannot Clinically Determine  Supporting Information:  Clinical Information:  Risk Factors: Per Nephrology note: "Problem #2 difficulty in breathing possibly combination of pneumonia and CHF presently is feeling better" Per H&P: "He was seen in the ER, was found to have chest x-ray, infiltrates consistent with bilateral infiltrates and/or volume overload congestive heart failure, which is a chronic situation. History of CHF ESRD  Signs & Symptoms: Cardiomegaly Per ED provider " He exhibits edema ( Lateral 2+ pitting edema)"  Diagnostics: Lab: BNP:>70000  Echo results: EF: 35-40% 10/05/10  EKG: NSR  Radiology: CXR=IMPRESSION: Cardiomegaly with central vascular congestion and right greater than left airspace opacities. Pulmonary edema versus infection.  Treatment: Hemodialysis   Reviewed: No response from MD  Thank You,  Harless Litten RN, MSN Clinical Documentation Specialist: Office# [phone] [facility] Information Management [place]        Abnormal Labs Clarification  THIS DOCUMENT IS NOT A PERMANENT PART OF THE MEDICAL RECORD  TO RESPOND TO THE THIS QUERY, FOLLOW THE INSTRUCTIONS BELOW:  6. If needed, update documentation for the patient's encounter via the notes activity.  7. Access this query again and click edit on the [employer].  8. After updating, or not, click F2 to complete all highlighted (required) fields concerning your review. Select "additional documentation in the medical  record" OR "no additional documentation provided".  9. Click Sign note button.  10. The deficiency will fall out of your InBasket *Please let us know if you are not able to complete this workflow by phone or e-mail (listed below).  Please update your documentation within the medical record to reflect your response to this query.                                                                                    03/02/12  Dear Dr.  Marton Redwood  In a better effort to capture your patient's severity of illness, reflect appropriate length of stay and utilization of resources, a review of the medical record has revealed the following indicators.    Based on your clinical judgment, please clarify and document in a progress note and/or discharge summary the clinical condition associated with the following supporting information:  In responding to this query please exercise your independent judgment.  The fact that a query is asked, does not imply that any particular answer is desired or expected.  Abnormal findings (laboratory, x-ray, pathologic, and other diagnostic results) are not coded and reported unless the physician indicates their clinical significance.   The medical record reflects the following clinical findings, please clarify the diagnostic and/or clinical significance:      Possible Clinical Conditions?                                 Supporting Information:  ____________________                                        Lab Test: ____________________  ____________________                                        Risk Factors: _________________  ____________________                                        Patient Values: ________________                       Normal Values :________________  Other Condition___________________                Treatment: ____________________  Cannot Clinically Determine_________   Suezanne Jacquet,  Harless Litten  Clinical Documentation Specialist:  Pager  Health Information Management [place]

## 2012-03-02 NOTE — Progress Notes (Signed)
   CARE MANAGEMENT NOTE 03/02/2012  Patient:  [name], [name]   Account Number:  [id number]  Date Initiated:  03/02/2012  Documentation initiated by:  Sharrie Rothman  Subjective/Objective Assessment:   Pt admitted from home with shortness of breath and pneumonia. Pt is dialysis pt. Lives with mother and step father. Pt is very noncompliant with dialysis and dietary restrictions.     Action/Plan:   CM spoke with pt. Encouraged pt to be compliant with treatments to cut down on frequent admission.   Anticipated DC Date:  03/05/2012   Anticipated DC Plan:  HOME/SELF CARE      DC Planning Services  CM consult      Choice offered to / List presented to:             Status of service:   Medicare Important Message given?   (If response is "NO", the following Medicare IM given date fields will be blank) Date Medicare IM given:   Date Additional Medicare IM given:    Discharge Disposition:  HOME/SELF CARE  Per UR Regulation:    If discussed at Long Length of Stay Meetings, dates discussed:    Comments:  03/02/12 1624 Arlyss Queen, RN BSN Care Management [phone] Pt admitted from home with sob and pneumonia. Pt is a dialysis pt. Very noncomplliant with dialysis and treatment. Offered nursing support through [facility]  at discharge and pt refused.

## 2012-03-02 NOTE — Progress Notes (Signed)
Subjective: He says he feels a little better. He is still coughing some. He has no other new complaints.  Objective: Vital signs in last 24 hours: Temp:  [97.6 F (36.4 C)-97.9 F (36.6 C)] 97.8 F (36.6 C) (03/15 0400) Pulse Rate:  [64-91] 67  (03/15 0700) Resp:  [10-24] 15  (03/15 0700) BP: (137-172)/(71-115) 146/87 mmHg (03/15 0700) SpO2:  [86 %-100 %] 97 % (03/15 0700) FiO2 (%):  [50 %] 50 % (03/15 0116) Weight:  [82.2 kg (181 lb 3.5 oz)-88.8 kg (195 lb 12.3 oz)] 82.2 kg (181 lb 3.5 oz) (03/15 0500) Weight change:  Last BM Date: 02/29/12  Intake/Output from previous day: 03/14 0701 - 03/15 0700 In: 763 [P.O.:460; I.V.:103; IV Piggyback:200] Out: 4000   PHYSICAL EXAM He is awake and alert. Her he looks relatively comfortable. His chest shows bilateral wheezes. His heart is regular without gallop. His abdomen is soft.  Lab Results:    Basic Metabolic Panel:  Basename 03/02/12 0415 03/01/12 0452  NA 137 135  K 4.0 4.7  CL 96 95*  CO2 30 24  GLUCOSE 101* 109*  BUN 27* 37*  CREATININE 5.19* 6.72*  CALCIUM 10.1 10.3  MG -- --  PHOS 4.7* --   Liver Function Tests: No results found for this basename: AST:2,ALT:2,ALKPHOS:2,BILITOT:2,PROT:2,ALBUMIN:2 in the last 72 hours No results found for this basename: LIPASE:2,AMYLASE:2 in the last 72 hours No results found for this basename: AMMONIA:2 in the last 72 hours CBC:  Basename 03/02/12 0415 03/01/12 0452  WBC 6.4 6.8  NEUTROABS -- --  HGB 10.1* 10.5*  HCT 30.3* 31.7*  MCV 92.1 91.6  PLT 179 200   Cardiac Enzymes:  Basename 03/01/12 0452  CKTOTAL 121  CKMB 2.7  CKMBINDEX --  TROPONINI <0.30   BNP:  Basename 03/01/12 0452  PROBNP >70000.0*   D-Dimer: No results found for this basename: DDIMER:2 in the last 72 hours CBG:  Basename 03/01/12 2247  GLUCAP 119*   Hemoglobin A1C: No results found for this basename: HGBA1C in the last 72 hours Fasting Lipid Panel: No results found for this basename:  CHOL,HDL,LDLCALC,TRIG,CHOLHDL,LDLDIRECT in the last 72 hours Thyroid Function Tests: No results found for this basename: TSH,T4TOTAL,FREET4,T3FREE,THYROIDAB in the last 72 hours Anemia Panel: No results found for this basename: VITAMINB12,FOLATE,FERRITIN,TIBC,IRON,RETICCTPCT in the last 72 hours Coagulation: No results found for this basename: LABPROT:2,INR:2 in the last 72 hours Urine Drug Screen: Drugs of Abuse     Component Value Date/Time   LABOPIA NONE DETECTED 06/09/2011 0500   LABOPIA NEGATIVE 02/06/2009 1102   COCAINSCRNUR NONE DETECTED 06/09/2011 0500   COCAINSCRNUR NEGATIVE 02/06/2009 1102   LABBENZ NONE DETECTED 06/09/2011 0500   LABBENZ NEGATIVE 02/06/2009 1102   AMPHETMU NONE DETECTED 06/09/2011 0500   AMPHETMU NEGATIVE 02/06/2009 1102   THCU NONE DETECTED 06/09/2011 0500   LABBARB NONE DETECTED 06/09/2011 0500    Alcohol Level: No results found for this basename: ETH:2 in the last 72 hours Urinalysis: No results found for this basename: COLORURINE:2,APPERANCEUR:2,LABSPEC:2,PHURINE:2,GLUCOSEU:2,HGBUR:2,BILIRUBINUR:2,KETONESUR:2,PROTEINUR:2,UROBILINOGEN:2,NITRITE:2,LEUKOCYTESUR:2 in the last 72 hours Misc. Labs:  ABGS  Basename 03/01/12 0445  PHART 7.433  PO2ART 46.8*  TCO2 21.9  HCO3 23.8   CULTURES Recent Results (from the past 240 hour(s))  CULTURE, BLOOD (ROUTINE X 2)     Status: Normal (Preliminary result)   Collection Time   03/01/12  6:00 AM      Component Value Range Status Comment   Specimen Description BLOOD RIGHT ANTECUBITAL   Final    Special Requests BOTTLES DRAWN  AEROBIC AND ANAEROBIC 5CC   Final    Culture NO GROWTH 1 DAY   Final    Report Status PENDING   Incomplete   CULTURE, BLOOD (ROUTINE X 2)     Status: Normal (Preliminary result)   Collection Time   03/01/12  6:01 AM      Component Value Range Status Comment   Specimen Description BLOOD RIGHT HAND   Final    Special Requests BOTTLES DRAWN AEROBIC AND ANAEROBIC 4CC   Final    Culture NO GROWTH  1 DAY   Final    Report Status PENDING   Incomplete   MRSA PCR SCREENING     Status: Normal   Collection Time   03/01/12  8:10 AM      Component Value Range Status Comment   MRSA by PCR NEGATIVE  NEGATIVE  Final    Studies/Results: Dg Chest 2 View  03/01/2012  *RADIOLOGY REPORT*  Clinical Data: Chest pain, cough, shortness of breath.  CHEST - 2 VIEW  Comparison: 02/02/2012  Findings: Cardiomegaly.  Perihilar opacity and central vascular fullness.  Right greater than left lower lobe consolidations.  No pneumothorax.  Aortic arch atherosclerosis.  Multilevel degenerative changes, exaggerated kyphosis, osteopenia.  IMPRESSION: Cardiomegaly with central vascular congestion and right greater than left airspace opacities.  Pulmonary edema versus infection.  Original Report Authenticated By: Waneta Martins, M.D.    Medications:  Scheduled:   . amLODipine  10 mg Oral Daily  . aspirin  81 mg Oral Daily  . aspirin EC  81 mg Oral Daily  . carvedilol  12.5 mg Oral BID WC  . ceFEPime (MAXIPIME) IV  1 g Intravenous Q24H  . chlorhexidine  15 mL Mouth/Throat BID  . dexlansoprazole  60 mg Oral Daily  . DULoxetine  60 mg Oral Daily  . enoxaparin (LOVENOX) injection  30 mg Subcutaneous Q24H  . hydrALAZINE  50 mg Oral TID  . hydrOXYzine  25 mg Oral BID  . labetalol  400 mg Oral BID  . levofloxacin (LEVAQUIN) IV  500 mg Intravenous Q48H  . levofloxacin (LEVAQUIN) IV  750 mg Intravenous Once  . lisinopril  20 mg Oral Daily  . multivitamin  1 tablet Oral Daily  . OLANZapine  10 mg Oral BID  . rOPINIRole  1 mg Oral BH-q7a  . rosuvastatin  20 mg Oral Daily  . sevelamer  4,000 mg Oral TID WC  . sodium chloride  3 mL Intravenous Q12H  . sodium chloride      . vancomycin  1,000 mg Intravenous Once  . vancomycin  1,000 mg Intravenous Q T,Th,Sa-HD  . DISCONTD: atorvastatin  10 mg Oral q1800  . DISCONTD: DULoxetine  60 mg Oral Daily  . DISCONTD: hydrALAZINE  50 mg Oral TID  . DISCONTD: hydrOXYzine  25  mg Oral BID  . DISCONTD: labetalol  200 mg Oral BID  . DISCONTD: levofloxacin (LEVAQUIN) IV  750 mg Intravenous Q24H  . DISCONTD: lisinopril  20 mg Oral Daily  . DISCONTD: multivitamin  1 tablet Oral Daily  . DISCONTD: OLANZapine  10 mg Oral BID  . DISCONTD: rOPINIRole  1 mg Oral q morning - 10a  . DISCONTD: sevelamer  800 mg Oral TID WC   Continuous:  ZOX:WRUEAV chloride, albuterol, heparin, heparin, ipratropium, oxyCODONE-acetaminophen, sevelamer, sodium chloride, zolpidem, DISCONTD: ipratropium-albuterol, DISCONTD: oxyCODONE-acetaminophen, DISCONTD: zolpidem  Assesment: He has bilateral pneumonia and is on 3 antibiotics which is appropriate. He does seem to be improving. He has multiple  other medical problems including end-stage renal disease on dialysis, accelerated hypertension which is better and mental illness Active Problems:  * No active hospital problems. *     Plan: No change in treatments he does seem to be improving on what he is on.    LOS: 1 day   Erabella Kuipers L 03/02/2012, 7:52 AM

## 2012-03-02 NOTE — Progress Notes (Signed)
PT TRANSFERRED TO ROOM 308. REPORT CALLED TO RN ON FLOOR. PT ALERT AND ORIENTED. RT FOREARM IV PATENT. LT ARM DIALYSIS INTACT. NO FURTHER C/O PAIN AT THIS TIME.VS STABLE.O2 AT 4L/MIN VIA [place]. DENIES ANY DYSPNEA.TRANSPORTED VIA W/C.

## 2012-03-02 NOTE — Progress Notes (Signed)
[id number]

## 2012-03-02 NOTE — Progress Notes (Signed)
Subjective: Interval History: has no complaint of difficulty in breathing. He denies any orthopnea or paroxysmal nocturnal dyspnea. He has occasional cough. This is nonproductive. He denies any chest pain and no nausea vomiting..  Objective: Vital signs in last 24 hours: Temp:  [97.6 F (36.4 C)-97.9 F (36.6 C)] 97.7 F (36.5 C) (03/15 0800) Pulse Rate:  [64-91] 72  (03/15 1000) Resp:  [10-21] 18  (03/15 1100) BP: (137-169)/(71-115) 147/82 mmHg (03/15 1100) SpO2:  [86 %-100 %] 99 % (03/15 1000) FiO2 (%):  [50 %] 50 % (03/15 0116) Weight:  [82.2 kg (181 lb 3.5 oz)-84.8 kg (186 lb 15.2 oz)] 82.2 kg (181 lb 3.5 oz) (03/15 0500) Weight change:   Intake/Output from previous day: 03/14 0701 - 03/15 0700 In: 773 [P.O.:460; I.V.:113; IV Piggyback:200] Out: 4000  Intake/Output this shift: Total I/O In: 490 [P.O.:480; I.V.:10] Out: 125 [Urine:125]  General appearance: alert, cooperative and no distress Resp: diminished breath sounds bilaterally and rhonchi bibasilar Cardio: regular rate and rhythm, S1, S2 normal, no murmur, click, rub or gallop GI: soft, non-tender; bowel sounds normal; no masses,  no organomegaly Extremities: edema Trace to 1+ edema.  Lab Results:  [facility] 03/02/12 0415 03/01/12 0452  WBC 6.4 6.8  HGB 10.1* 10.5*  HCT 30.3* 31.7*  PLT 179 200   BMET:  Basename 03/02/12 0415 03/01/12 0452  NA 137 135  K 4.0 4.7  CL 96 95*  CO2 30 24  GLUCOSE 101* 109*  BUN 27* 37*  CREATININE 5.19* 6.72*  CALCIUM 10.1 10.3   No results found for this basename: PTH:2 in the last 72 hours Iron Studies: No results found for this basename: IRON,TIBC,TRANSFERRIN,FERRITIN in the last 72 hours  Studies/Results: Dg Chest 2 View  03/01/2012  *RADIOLOGY REPORT*  Clinical Data: Chest pain, cough, shortness of breath.  CHEST - 2 VIEW  Comparison: 02/02/2012  Findings: Cardiomegaly.  Perihilar opacity and central vascular fullness.  Right greater than left lower lobe  consolidations.  No pneumothorax.  Aortic arch atherosclerosis.  Multilevel degenerative changes, exaggerated kyphosis, osteopenia.  IMPRESSION: Cardiomegaly with central vascular congestion and right greater than left airspace opacities.  Pulmonary edema versus infection.  Original Report Authenticated By: Waneta Martins, M.D.    I have reviewed the patient'[name] current medications.  Assessment/Plan: Problem #1 end-stage renal disease is status post hemodialysis yesterday his BUN is and 7 creatinine is 5.19 his potassium of 4 which is good. Problem #2 difficulty in breathing possibly combination of pneumonia and CHF presently is feeling better. Patient was dialyzed yesterday with 4 L removal. Problem #3 history of hypertension his blood pressure seems to be reasonably controlled Problem #4 anemia his hemoglobin is 10.1 hematocrit 30.3 stable he'[name] on Epogen. Problem #5 history of ischemic cardiomyopathy Problem #6 history of bipolar disorder Problem #7 COPD patient continues to smoke. Plan: We'll do hemodialysis tomorrow and we'll try to get another 4 L if his blood pressure tolerates. Check his CBC and basic metabolic panel.    LOS: 1 day   [name] 03/02/2012,11:23 AM
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
## 2012-03-03 ENCOUNTER — Inpatient Hospital Stay (HOSPITAL_COMMUNITY): Payer: Medicare Other

## 2012-03-03 LAB — BASIC METABOLIC PANEL
BUN: 41 mg/dL — ABNORMAL HIGH (ref 6–23)
CO2: 28 mEq/L (ref 19–32)
Calcium: 10.1 mg/dL (ref 8.4–10.5)
Creatinine, Ser: 7.61 mg/dL — ABNORMAL HIGH (ref 0.50–1.35)
Glucose, Bld: 109 mg/dL — ABNORMAL HIGH (ref 70–99)

## 2012-03-03 LAB — CBC
HCT: 29.8 % — ABNORMAL LOW (ref 39.0–52.0)
Hemoglobin: 10 g/dL — ABNORMAL LOW (ref 13.0–17.0)
MCH: 30.8 pg (ref 26.0–34.0)
MCV: 91.7 fL (ref 78.0–100.0)
Platelets: 165 10*3/uL (ref 150–400)
RBC: 3.25 MIL/uL — ABNORMAL LOW (ref 4.22–5.81)

## 2012-03-03 MED ORDER — SODIUM CHLORIDE 0.9 % IJ SOLN
INTRAMUSCULAR | Status: AC
  Filled 2012-03-03: qty 3

## 2012-03-03 MED ORDER — LEVOFLOXACIN 250 MG PO TABS: 250.0000 mg | ORAL_TABLET | Freq: Every day | ORAL | Status: AC

## 2012-03-03 NOTE — Discharge Summary (Signed)
[id number] 

## 2012-03-03 NOTE — Progress Notes (Signed)
Subjective: He looks much better. He has no new complaints he is hopeful that he can go home after dialysis today.  Objective: Vital signs in last 24 hours: Temp:  [97.2 F (36.2 C)-98.2 F (36.8 C)] 98.2 F (36.8 C) (03/16 0910) Pulse Rate:  [70-86] 70  (03/16 0930) Resp:  [16-20] 20  (03/16 0910) BP: (126-161)/(74-97) 156/92 mmHg (03/16 0930) SpO2:  [98 %-100 %] 98 % (03/16 0910) Weight change:  Last BM Date: 03/02/12  Intake/Output from previous day: 03/15 0701 - 03/16 0700 In: 920 [P.O.:840; I.V.:30; IV Piggyback:50] Out: 125 [Urine:125]  PHYSICAL EXAM General appearance: alert, cooperative and no distress Resp: rhonchi bilaterally Cardio: regular rate and rhythm, S1, S2 normal, no murmur, click, rub or gallop GI: soft, non-tender; bowel sounds normal; no masses,  no organomegaly Extremities: extremities normal, atraumatic, no cyanosis or edema  Lab Results:    Basic Metabolic Panel:  Basename 03/03/12 0650 03/02/12 0415  NA 133* 137  K 4.1 4.0  CL 93* 96  CO2 28 30  GLUCOSE 109* 101*  BUN 41* 27*  CREATININE 7.61* 5.19*  CALCIUM 10.1 10.1  MG -- --  PHOS -- 4.7*   Liver Function Tests: No results found for this basename: AST:2,ALT:2,ALKPHOS:2,BILITOT:2,PROT:2,ALBUMIN:2 in the last 72 hours No results found for this basename: LIPASE:2,AMYLASE:2 in the last 72 hours No results found for this basename: AMMONIA:2 in the last 72 hours CBC:  Basename 03/03/12 0650 03/02/12 0415  WBC 6.4 6.4  NEUTROABS -- --  HGB 10.0* 10.1*  HCT 29.8* 30.3*  MCV 91.7 92.1  PLT 165 179   Cardiac Enzymes:  Basename 03/01/12 0452  CKTOTAL 121  CKMB 2.7  CKMBINDEX --  TROPONINI <0.30   BNP:  Basename 03/01/12 0452  PROBNP >70000.0*   D-Dimer: No results found for this basename: DDIMER:2 in the last 72 hours CBG:  Basename 03/02/12 1201 03/02/12 0719 03/01/12 2247  GLUCAP 94 80 119*   Hemoglobin A1C: No results found for this basename: HGBA1C in the last 72  hours Fasting Lipid Panel: No results found for this basename: CHOL,HDL,LDLCALC,TRIG,CHOLHDL,LDLDIRECT in the last 72 hours Thyroid Function Tests: No results found for this basename: TSH,T4TOTAL,FREET4,T3FREE,THYROIDAB in the last 72 hours Anemia Panel: No results found for this basename: VITAMINB12,FOLATE,FERRITIN,TIBC,IRON,RETICCTPCT in the last 72 hours Coagulation: No results found for this basename: LABPROT:2,INR:2 in the last 72 hours Urine Drug Screen: Drugs of Abuse     Component Value Date/Time   LABOPIA NONE DETECTED 06/09/2011 0500   LABOPIA NEGATIVE 02/06/2009 1102   COCAINSCRNUR NONE DETECTED 06/09/2011 0500   COCAINSCRNUR NEGATIVE 02/06/2009 1102   LABBENZ NONE DETECTED 06/09/2011 0500   LABBENZ NEGATIVE 02/06/2009 1102   AMPHETMU NONE DETECTED 06/09/2011 0500   AMPHETMU NEGATIVE 02/06/2009 1102   THCU NONE DETECTED 06/09/2011 0500   LABBARB NONE DETECTED 06/09/2011 0500    Alcohol Level: No results found for this basename: ETH:2 in the last 72 hours Urinalysis: No results found for this basename: COLORURINE:2,APPERANCEUR:2,LABSPEC:2,PHURINE:2,GLUCOSEU:2,HGBUR:2,BILIRUBINUR:2,KETONESUR:2,PROTEINUR:2,UROBILINOGEN:2,NITRITE:2,LEUKOCYTESUR:2 in the last 72 hours Misc. Labs:  ABGS  Basename 03/01/12 0445  PHART 7.433  PO2ART 46.8*  TCO2 21.9  HCO3 23.8   CULTURES Recent Results (from the past 240 hour(s))  CULTURE, BLOOD (ROUTINE X 2)     Status: Normal (Preliminary result)   Collection Time   03/01/12  6:00 AM      Component Value Range Status Comment   Specimen Description BLOOD RIGHT ANTECUBITAL   Final    Special Requests BOTTLES DRAWN AEROBIC AND ANAEROBIC 5CC  Final    Culture NO GROWTH 1 DAY   Final    Report Status PENDING   Incomplete   CULTURE, BLOOD (ROUTINE X 2)     Status: Normal (Preliminary result)   Collection Time   03/01/12  6:01 AM      Component Value Range Status Comment   Specimen Description BLOOD RIGHT HAND   Final    Special Requests  BOTTLES DRAWN AEROBIC AND ANAEROBIC 4CC   Final    Culture NO GROWTH 1 DAY   Final    Report Status PENDING   Incomplete   MRSA PCR SCREENING     Status: Normal   Collection Time   03/01/12  8:10 AM      Component Value Range Status Comment   MRSA by PCR NEGATIVE  NEGATIVE  Final    Studies/Results: No results found.  Medications:  Scheduled:   . amLODipine  10 mg Oral Daily  . aspirin  81 mg Oral Daily  . b complex-vitamin c-folic acid  1 tablet Oral Daily  . carvedilol  12.5 mg Oral BID WC  . ceFEPime (MAXIPIME) IV  500 mg Intravenous Q24H  . chlorhexidine  15 mL Mouth/Throat BID  . dexlansoprazole  60 mg Oral Daily  . DULoxetine  60 mg Oral Daily  . enoxaparin (LOVENOX) injection  30 mg Subcutaneous Q24H  . hydrALAZINE  50 mg Oral TID  . hydrOXYzine  25 mg Oral BID  . labetalol  400 mg Oral BID  . levofloxacin (LEVAQUIN) IV  500 mg Intravenous Q48H  . lisinopril  20 mg Oral Daily  . OLANZapine  10 mg Oral BID  . rOPINIRole  1 mg Oral BH-q7a  . rosuvastatin  20 mg Oral Daily  . sevelamer  4,000 mg Oral TID WC  . sodium chloride  3 mL Intravenous Q12H  . sodium chloride      . vancomycin  1,000 mg Intravenous Q T,Th,Sa-HD  . DISCONTD: aspirin EC  81 mg Oral Daily   Continuous:  WUJ:WJXBJY chloride, albuterol, heparin, heparin, ipratropium, oxyCODONE-acetaminophen, sevelamer, sodium chloride, zolpidem, DISCONTD: heparin, DISCONTD: heparin, DISCONTD: heparin, DISCONTD: heparin  Assesment: He has pneumonia on chest x-ray. He has chronic kidney disease which is better. He has hypertension which is stable. He is much improved and I agree he's okay to go home but he will need antibiotics for at least another week Active Problems:  * No active hospital problems. *     Plan: I will sign off since he is being discharged    LOS: 2 days   Cael Worth L 03/03/2012, 9:54 AM

## 2012-03-03 NOTE — Progress Notes (Signed)
IV removed, site WNL.  Pt given d/c instructions and new prescriptions.  Discussed home care with patient and discussed home medications, patient verbalizes understanding. F/U appointment to be made by pt, pt states they will keep appointments. Pt is stable at this time. Pt taken to main entrance, ambulated.

## 2012-03-03 NOTE — Progress Notes (Signed)
Subjective: Interval History: has no complaint of difficulty in breathing. He denies any orthopnea or paroxysmal nocturnal dyspnea. Presently patient also does not have any chest pain..  Objective: Vital signs in last 24 hours: Temp:  [97.2 F (36.2 C)-98.1 F (36.7 C)] 97.7 F (36.5 C) (03/16 0436) Pulse Rate:  [69-86] 86  (03/16 0436) Resp:  [16-20] 20  (03/16 0436) BP: (126-169)/(74-94) 161/94 mmHg (03/16 0436) SpO2:  [96 %-100 %] 98 % (03/16 0436) Weight change:   Intake/Output from previous day: 03/15 0701 - 03/16 0700 In: 920 [P.O.:840; I.V.:30; IV Piggyback:50] Out: 125 [Urine:125] Intake/Output this shift:    General appearance: alert, cooperative and no distress Resp: clear to auscultation bilaterally Cardio: regular rate and rhythm, S1, S2 normal, no murmur, click, rub or gallop GI: soft, non-tender; bowel sounds normal; no masses,  no organomegaly Extremities: edema Trace edema bilaterally  Lab Results:  Basename 03/03/12 0650 03/02/12 0415  WBC 6.4 6.4  HGB 10.0* 10.1*  HCT 29.8* 30.3*  PLT 165 179   BMET:  Basename 03/02/12 0415 03/01/12 0452  NA 137 135  K 4.0 4.7  CL 96 95*  CO2 30 24  GLUCOSE 101* 109*  BUN 27* 37*  CREATININE 5.19* 6.72*  CALCIUM 10.1 10.3   No results found for this basename: PTH:2 in the last 72 hours Iron Studies: No results found for this basename: IRON,TIBC,TRANSFERRIN,FERRITIN in the last 72 hours  Studies/Results: No results found.  I have reviewed the patient's current medications.  Assessment/Plan: Problem #1 end-stage renal disease is status post hemodialysis the day before yesterday his BUN is 27 creatinine is 5.19 with potassium of 4. Presently patient is a symptomatic. Problem #2 difficulty breathing possibly a combination of CHF and possible pneumonia her presently patient is a febrile. Problem #3 history of hypertension his blood pressure seems to be controlled very well Problem #4 anemia this is secondary to  end-stage renal disease patient is on Epogen. Problem #5 history of bipolar disorder Problem #6 history of COPD Problem #7 history of coronary artery disease. Recommendation will dialyze patient today and we'll attempt to get about 4 L to bring him to his dry weight. And if patient is discharged will follow him as outpatient.    LOS: 2 days   Abigale Dorow S 03/03/2012,7:28 AM

## 2012-03-05 NOTE — Discharge Summary (Signed)
NAME:  [name], COACH NO.:  [id number]  MEDICAL RECORD NO.:  [id number]  LOCATION:                                 FACILITY:  PHYSICIAN:  Melvyn Novas, MDDATE OF BIRTH:  09/24/1976  DATE OF ADMISSION: DATE OF DISCHARGE:  LH                              DISCHARGE SUMMARY   The patient has __________ medical problems including accelerated hypertension, ischemic cardiomyopathy, ejection fraction 45%, bipolar disorder, schizophrenia, chronic noncompliance, severe COPD, continued smoking and tobacco abuse, a pack per day, hyperlipidemia, chronic renal failure, end-stage renal disease, dialysis.  The patient remained with some dyspnea and mildly cough with whitish to greenish sputum at times. Chest x-ray revealed bibasilar infiltrates consistent with volume overload and/or infiltrate.  The patient was placed initially on 3 antibiotics; Maxipime, Levaquin, and vancomycin.  He was given urgent dialysis, this seemed to improve.  The patient was noncompliant with Norvasc 10 mg.  He had some accelerated hypertension on admission.  His blood pressure was better controlled.  His dyspnea improved after 3 sessions of dialysis in the hospital and he was calmed.  He was urged to have smoking cessation.  The patient was given Chantix multiple times. He is offered this again, declines, and told to use nicotine patches if possible.  He subsequently discharged on all aforementioned medicines.  He came in on-call including: 1. Norvasc 10 mg daily. 2. Aspirin 81 mg per day. 3. Nephro-Vite 0.8 mg tabs t.i.d. 4. Carvedilol 12.5 mg p.o. b.i.d. 5. Clonidine 0.2 mg p.o. t.i.d. 6. Dexilant 60 mg p.o. daily. 7. Cymbalta 60 mg p.o. daily. 8. Hydralazine 50 mg p.o. t.i.d. 9. Atarax 25 mg p.o. t.i.d. p.r.n. 10.Normodyne 400 mg p.o. b.i.d. 11.Zestril 20 mg p.o. daily. 12.Zyprexa 10 mg p.o. daily. 13.Percocet 5 mg/325 p.o. q.i.d. p.r.n. 14.ReQuip 1 mg p.o. at  bedtime. 15.Crestor 20 mg p.o. daily. 16.Renvela 800 mg 3 tablets b.i.d. 17.Ambien 10 mg p.o. at bedtime. 18.Given a prescription for Levaquin 250 mg p.o. daily for an     additional 5 days to cover any upper respiratory infection that he     may have.  The patient is urged to follow up in office and complete full dialysis and follow up with me for assessment of hemodynamics, blood pressure control, and electrolytes.     Melvyn Novas, MD     RMD/MEDQ  D:  03/03/2012  T:  03/03/2012  Job:  [id number]

## 2012-03-06 LAB — CULTURE, BLOOD (ROUTINE X 2)
Culture: NO GROWTH
Culture: NO GROWTH

## 2012-03-07 IMAGING — CR DG CHEST 1V PORT
1 series · 1 of 1 positions shown · non-contrast
Comparison: Chest x-ray 08/09/2010

CLINICAL DATA: Shortness of breath.

PORTABLE CHEST - 1 VIEW

[view not recorded]
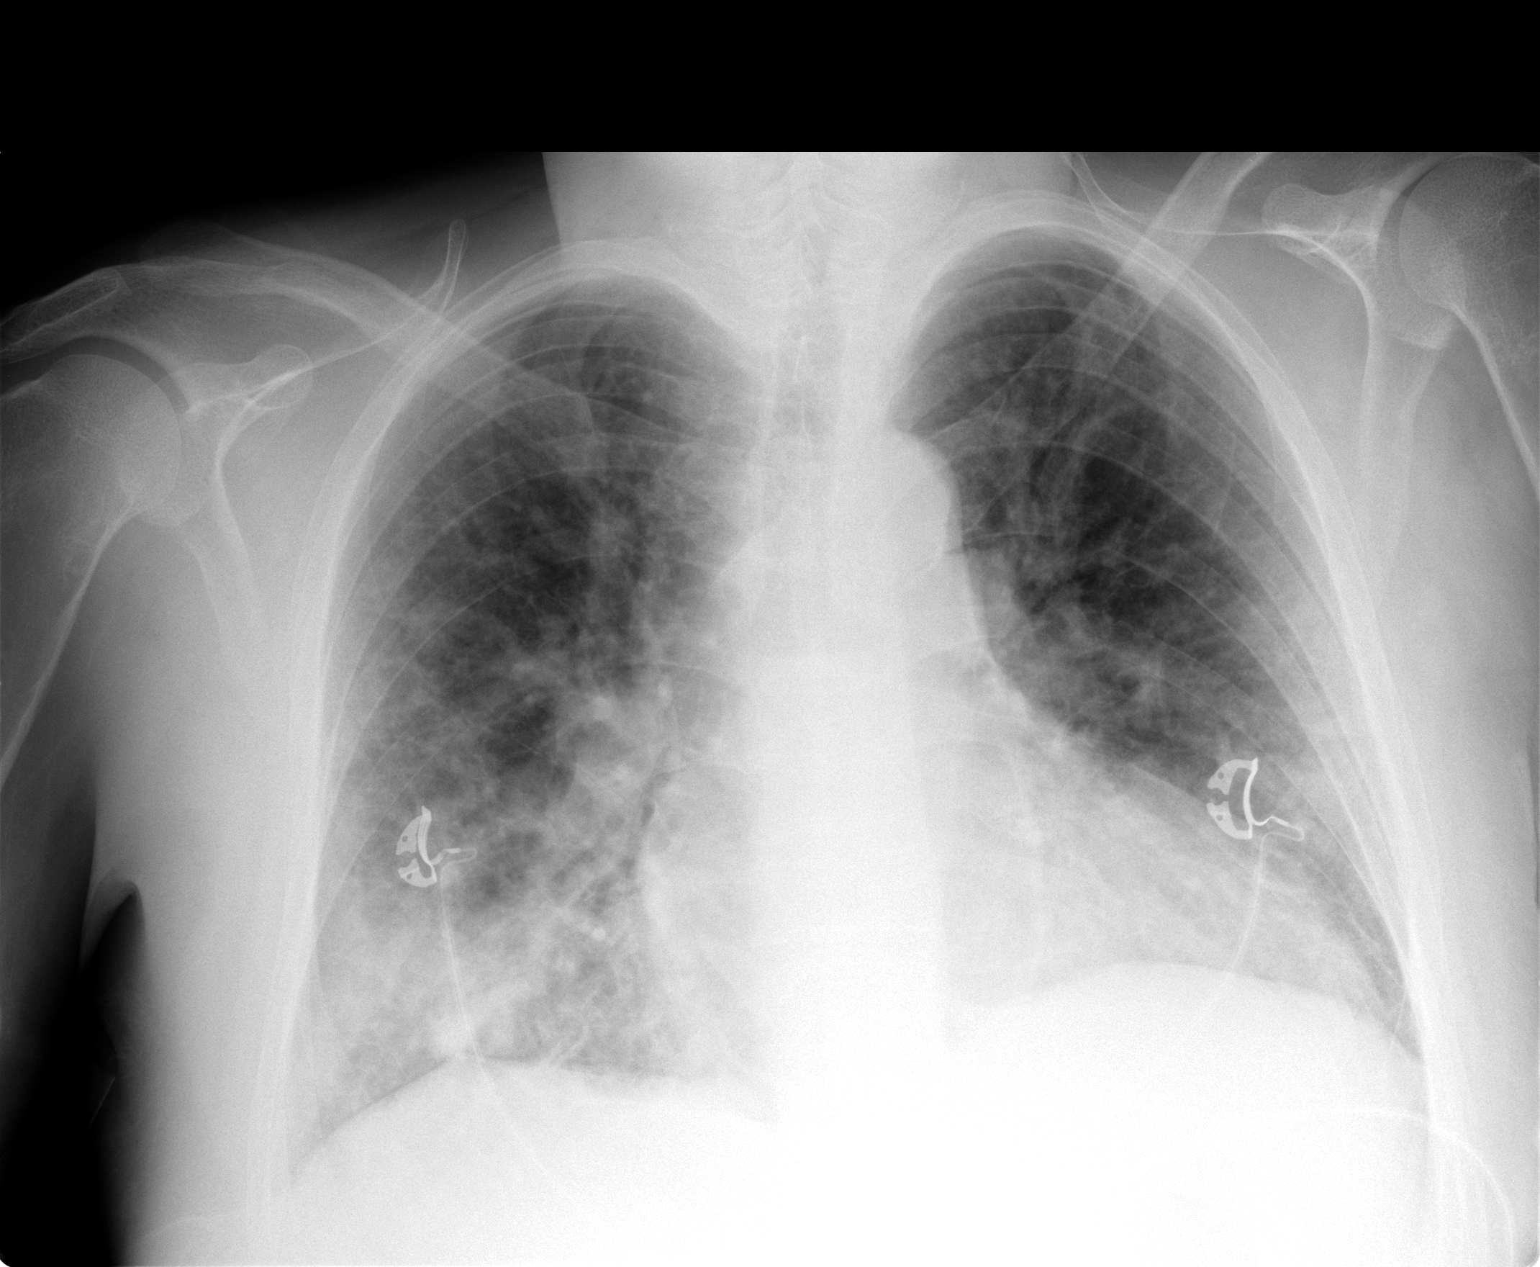

[1 of 1 positions shown; findings below may reference images not displayed]

FINDINGS: The heart is enlarged but stable.  There is vascular
congestion and pulmonary edema consistent with CHF.  No definite
pleural effusions.
IMPRESSION: Cardiac enlargement and pulmonary edema.

## 2012-03-19 IMAGING — CR DG CHEST 1V PORT
1 series · 1 of 1 positions shown · non-contrast
Comparison: 08/23/2010

CLINICAL DATA: Chest pain and short of breath

PORTABLE CHEST - 1 VIEW

[view not recorded]
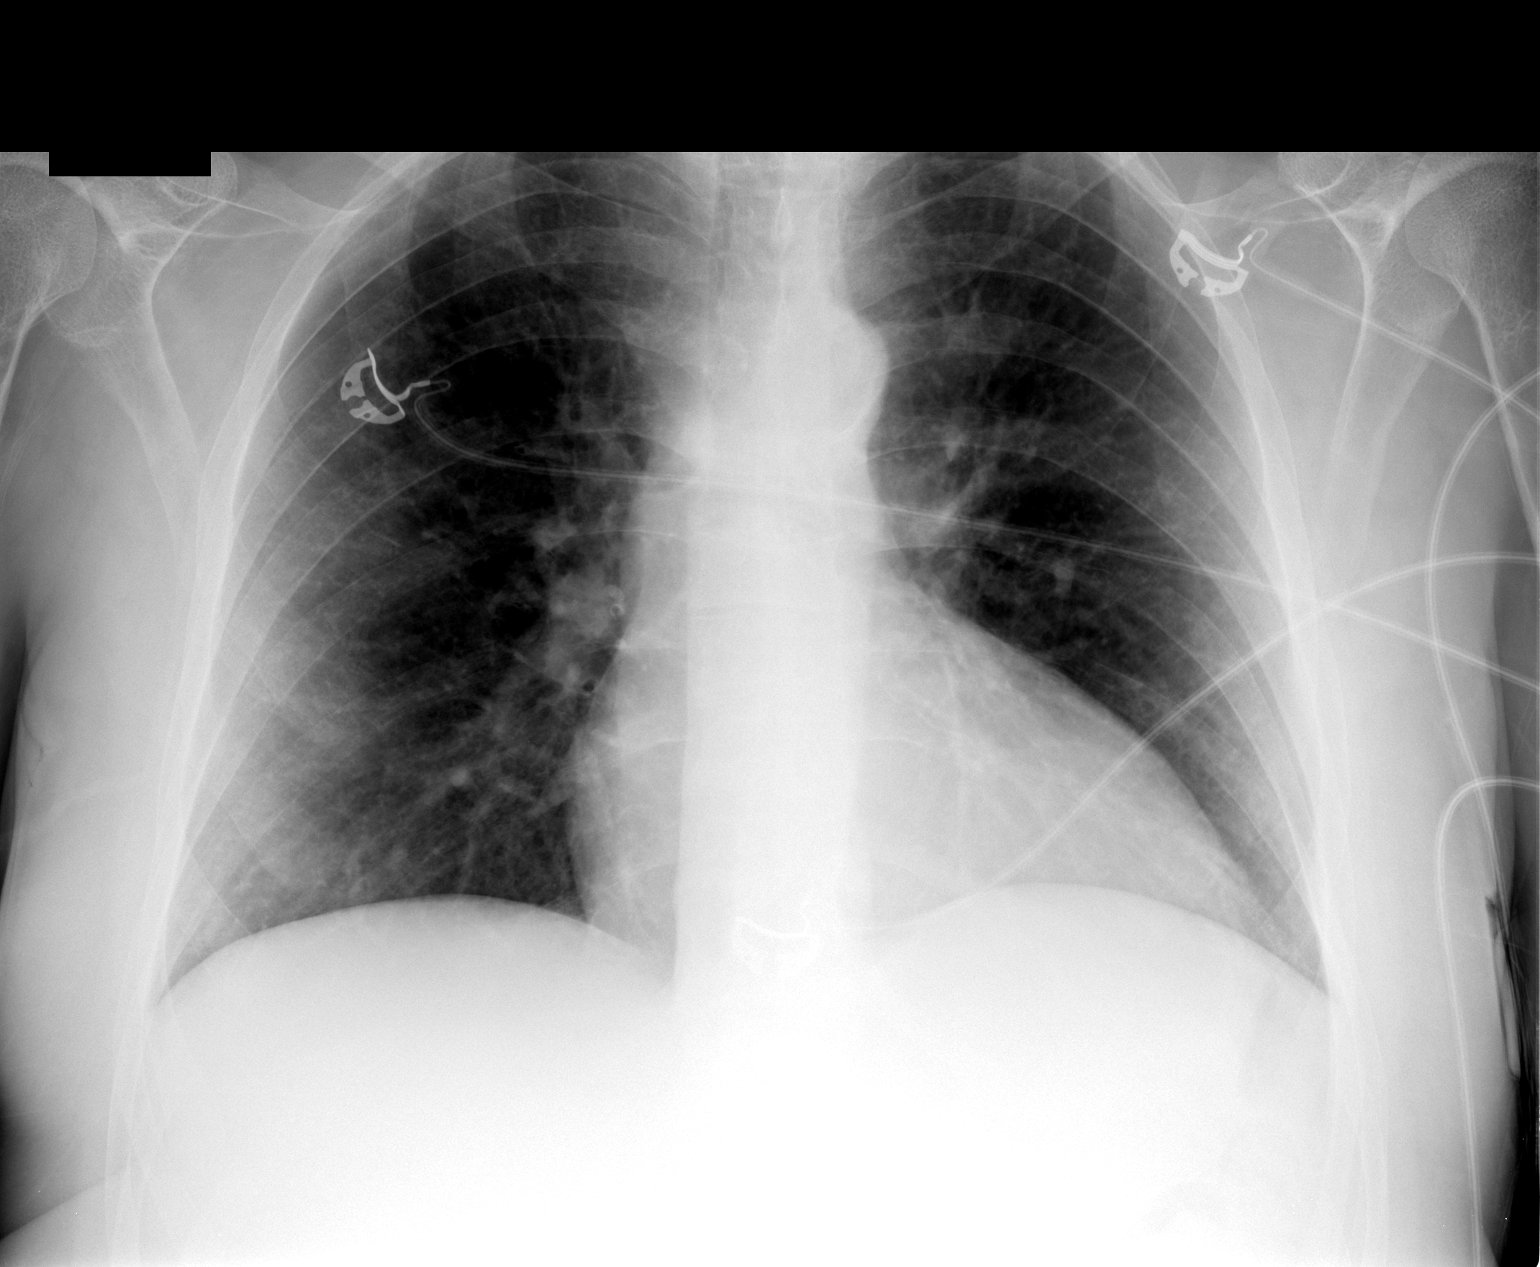

[1 of 1 positions shown; findings below may reference images not displayed]

FINDINGS: Normal heart size.  Clear lungs.  Low volumes.  No
pneumothorax.
IMPRESSION: No active cardiopulmonary disease.

## 2012-03-22 ENCOUNTER — Encounter (HOSPITAL_COMMUNITY): Payer: Self-pay | Admitting: *Deleted

## 2012-03-22 ENCOUNTER — Emergency Department (HOSPITAL_COMMUNITY)
Admission: EM | Admit: 2012-03-22 | Discharge: 2012-03-22 | Disposition: A | Discharge status: Home / Self Care | Payer: Medicare Other | Attending: Emergency Medicine | Admitting: Emergency Medicine

## 2012-03-22 DIAGNOSIS — J4489 Other specified chronic obstructive pulmonary disease: Secondary | ICD-10-CM | POA: Insufficient documentation

## 2012-03-22 DIAGNOSIS — N186 End stage renal disease: Secondary | ICD-10-CM | POA: Insufficient documentation

## 2012-03-22 DIAGNOSIS — D649 Anemia, unspecified: Secondary | ICD-10-CM | POA: Insufficient documentation

## 2012-03-22 DIAGNOSIS — F17200 Nicotine dependence, unspecified, uncomplicated: Secondary | ICD-10-CM | POA: Insufficient documentation

## 2012-03-22 DIAGNOSIS — F209 Schizophrenia, unspecified: Secondary | ICD-10-CM | POA: Insufficient documentation

## 2012-03-22 DIAGNOSIS — J449 Chronic obstructive pulmonary disease, unspecified: Secondary | ICD-10-CM | POA: Insufficient documentation

## 2012-03-22 DIAGNOSIS — Z992 Dependence on renal dialysis: Secondary | ICD-10-CM | POA: Insufficient documentation

## 2012-03-22 DIAGNOSIS — X500XXA Overexertion from strenuous movement or load, initial encounter: Secondary | ICD-10-CM | POA: Insufficient documentation

## 2012-03-22 DIAGNOSIS — G894 Chronic pain syndrome: Secondary | ICD-10-CM | POA: Insufficient documentation

## 2012-03-22 DIAGNOSIS — I120 Hypertensive chronic kidney disease with stage 5 chronic kidney disease or end stage renal disease: Secondary | ICD-10-CM | POA: Insufficient documentation

## 2012-03-22 DIAGNOSIS — S39012A Strain of muscle, fascia and tendon of lower back, initial encounter: Secondary | ICD-10-CM

## 2012-03-22 DIAGNOSIS — F319 Bipolar disorder, unspecified: Secondary | ICD-10-CM | POA: Insufficient documentation

## 2012-03-22 DIAGNOSIS — S335XXA Sprain of ligaments of lumbar spine, initial encounter: Secondary | ICD-10-CM | POA: Insufficient documentation

## 2012-03-22 IMAGING — US US ABDOMEN COMPLETE
1 series · 13 of 25 positions shown · non-contrast
Comparison: CT 10/13/2008

CLINICAL DATA: Abdominal pain, vomiting. Hypertension, renal
failure.

COMPLETE ABDOMINAL ULTRASOUND

[Series 1: us abdomen complete · 0.30mm/px · 13 of 93 slices shown]
[im 1/93]
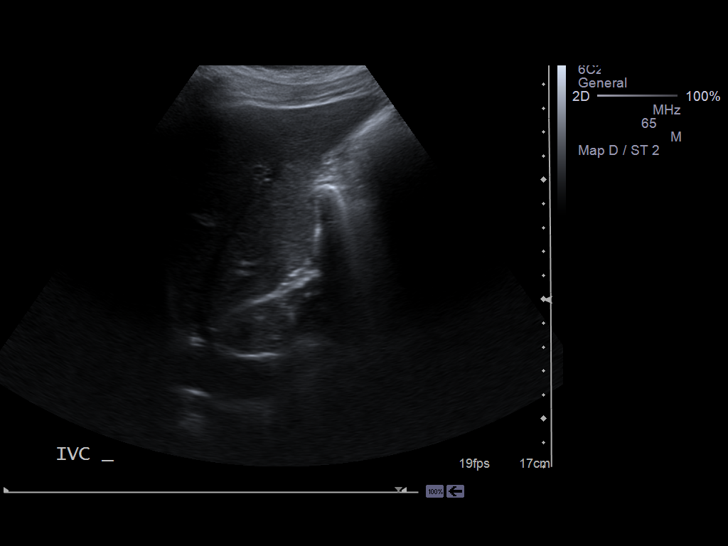
[im 8/93]
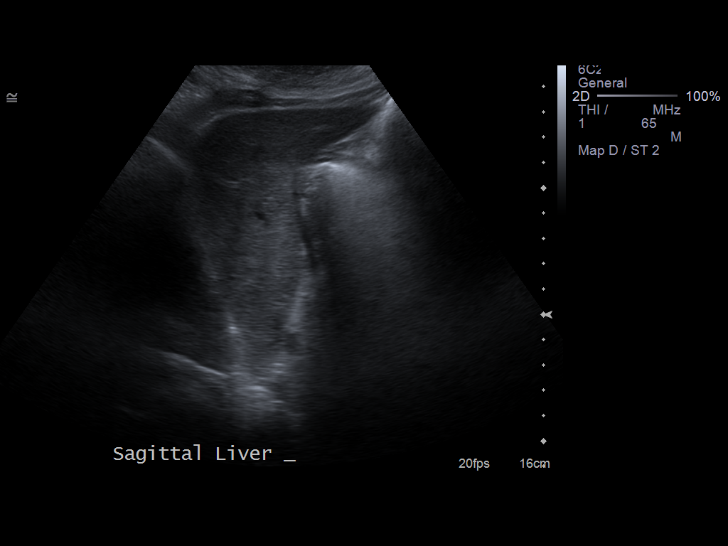
[im 16/93]
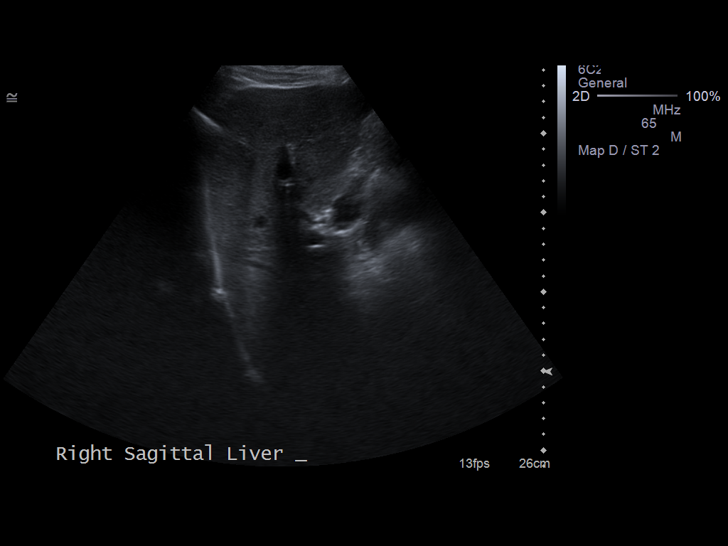
[im 24/93]
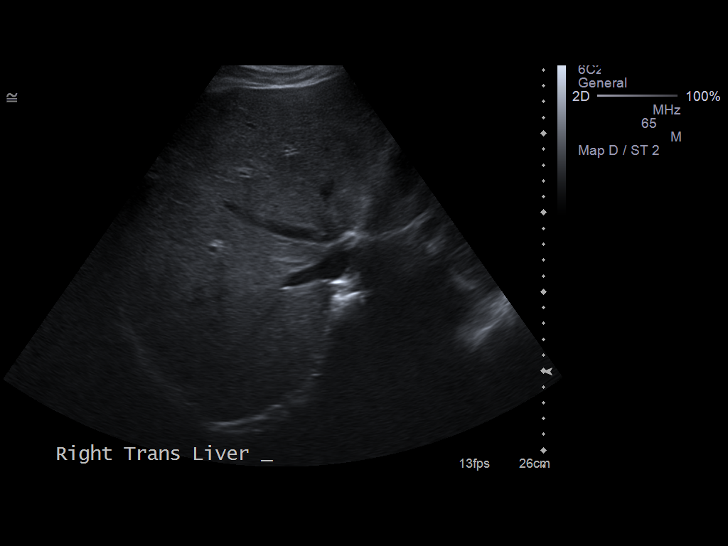
[im 31/93]
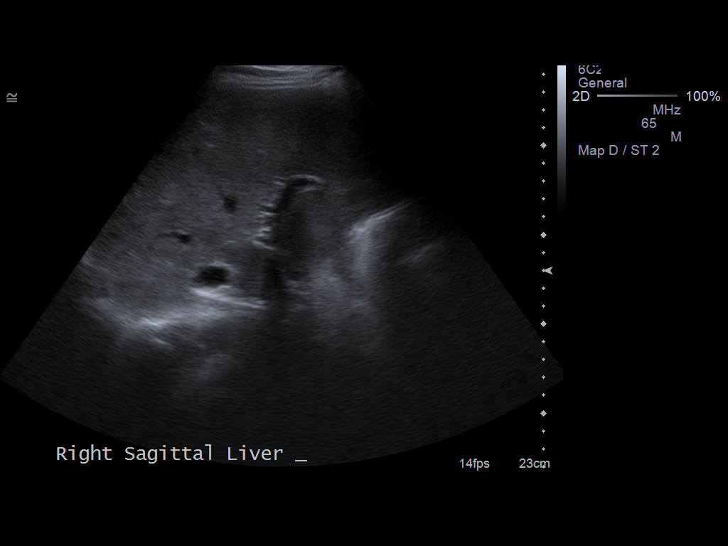
[im 39/93]
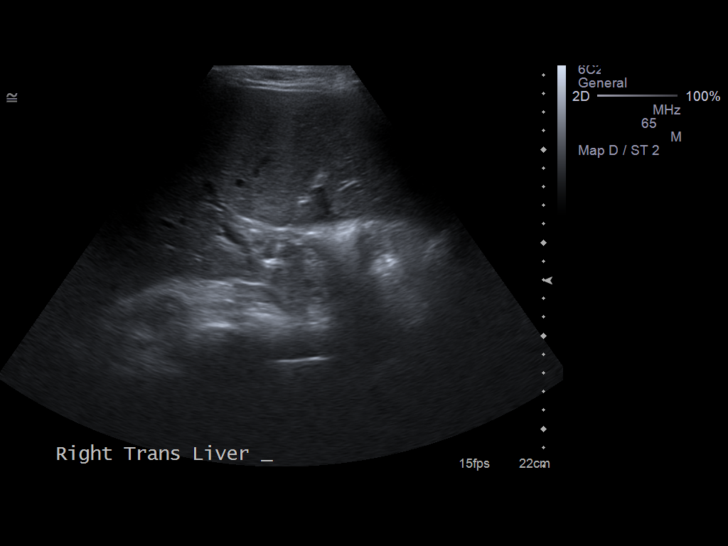
[im 47/93]
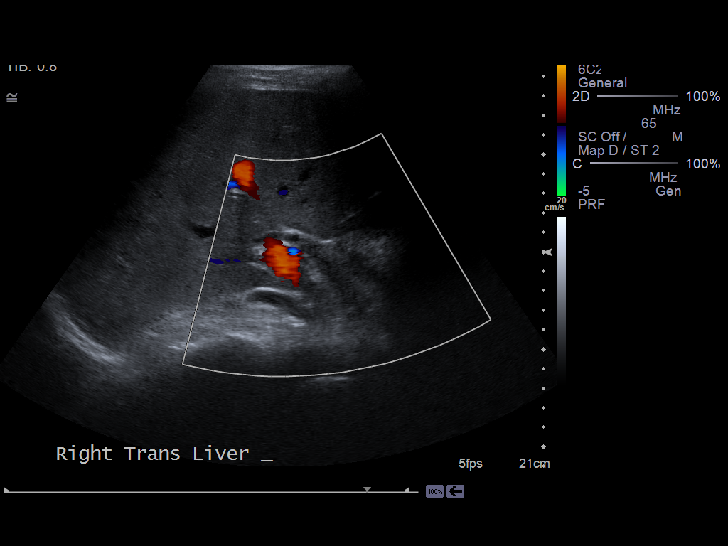
[im 54/93]
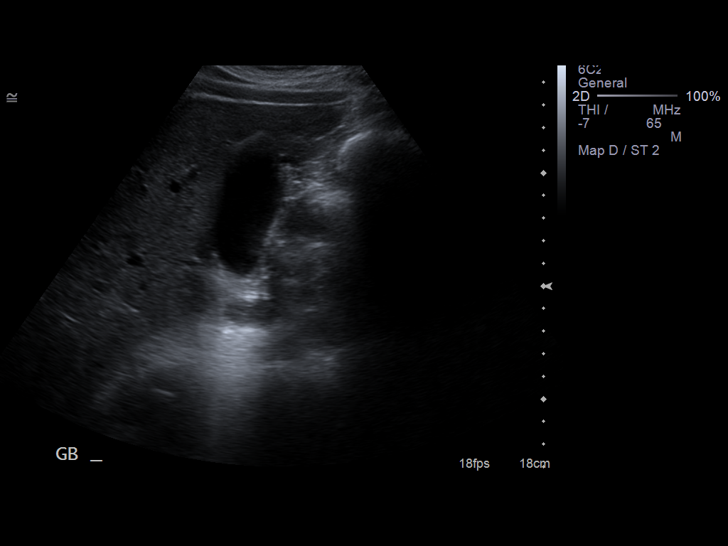
[im 62/93]
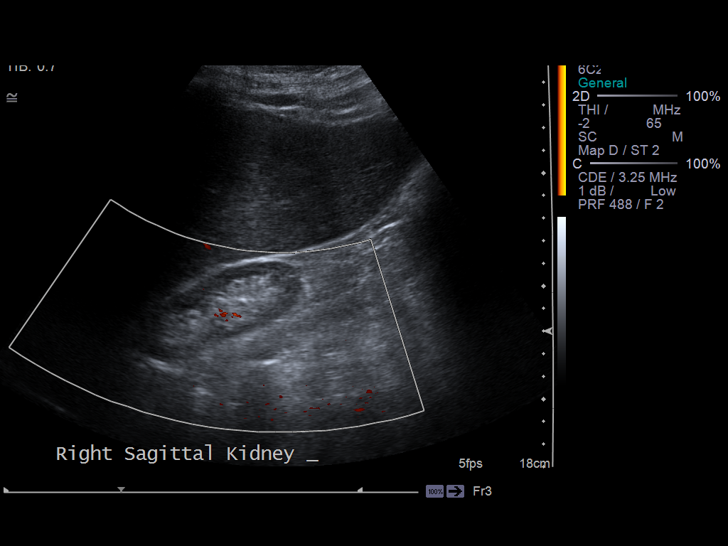
[im 70/93]
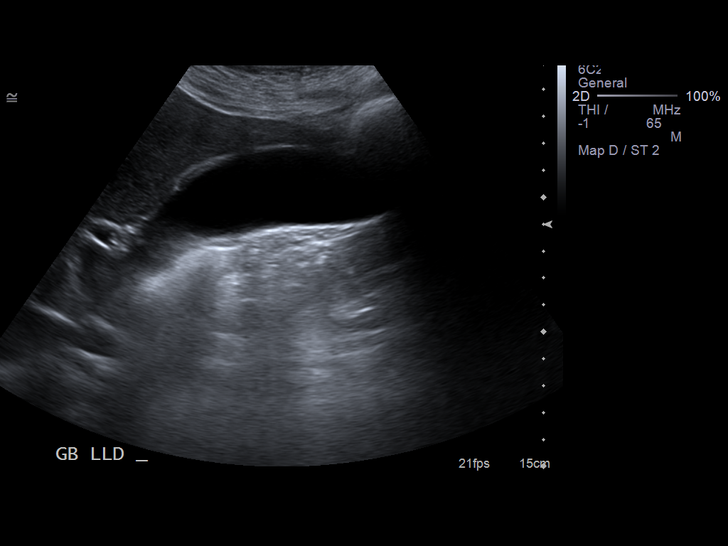
[im 77/93]
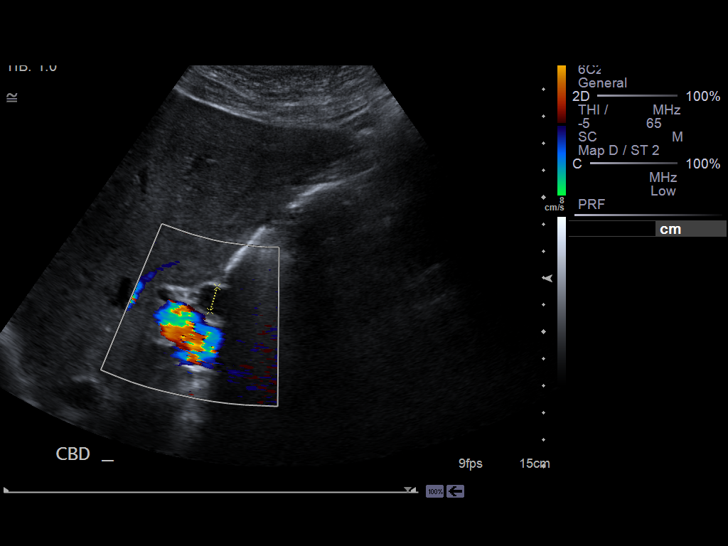
[im 85/93]
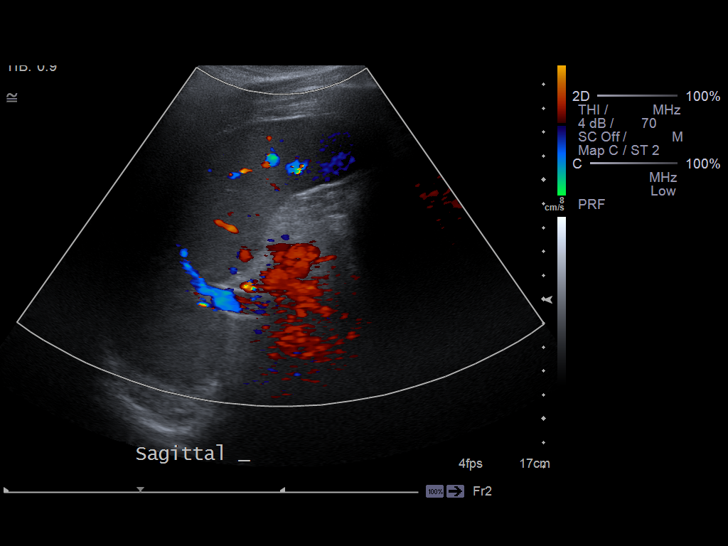
[im 93/93]
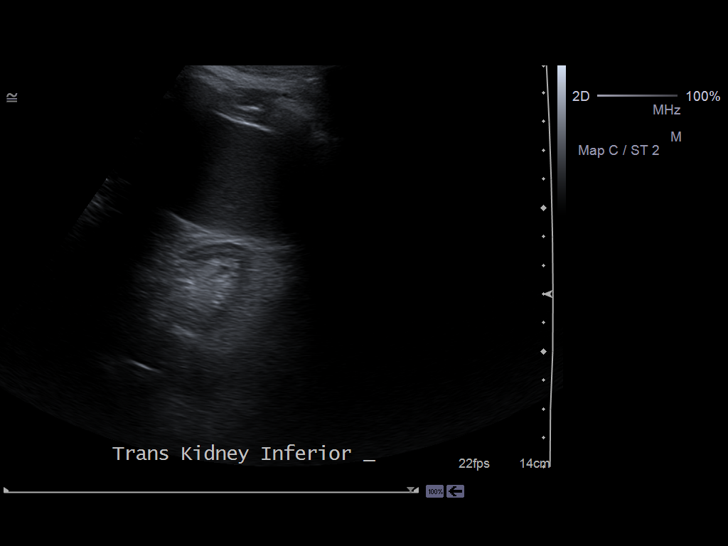

[13 of 25 positions shown; findings below may reference images not displayed]

FINDINGS: Gallbladder:  No gallstones, gallbladder wall thickening, or
pericholecystic fluid. Gallbladder wall is 1.5 mm in thickness,
within normal limits.

Common bile duct:  Measures 9 mm, prominent for patient's age.  No
choledocholithiasis.

Liver:  No focal lesion identified.  Within normal limits in
parenchymal echogenicity. Liver is 20 cm in length.

IVC:  Appears normal.

Pancreas:  The pancreas is not well seen because of overlying bowel
gas.

Spleen:  The spleen is 11 cm in length and homogeneous.

Right Kidney:  The right kidney is echogenic and 8.1 cm in length.
No focal mass.

Left Kidney:  The left kidney is 7.6 cm in length and echogenic.
No focal mass.  No hydronephrosis.

Abdominal aorta:  The abdominal aorta is 2.6 cm in diameter.  Focal
calcifications are noted.  No aneurysm.
IMPRESSION: 1.  No ultrasound evidence for acute cholecystitis.
2.  Common bile duct is prominent for patient's age.  No evidence
for choledocholithiasis however.
3.  Small, echogenic kidneys.
4.  Non-visualized pancreas.
5.  Prominent liver and spleen size may be within normal limits for
patient body habitus.

## 2012-03-22 MED ORDER — HYDROMORPHONE HCL PF 1 MG/ML IJ SOLN
1.0000 mg | Freq: Once | INTRAMUSCULAR | Status: AC
  Administered 2012-03-22: 1 mg via INTRAMUSCULAR
  Filled 2012-03-22: qty 1

## 2012-03-22 NOTE — ED Notes (Signed)
Pain low back since picked up his ill mother.on Saturday.  Alert, NAD

## 2012-03-22 NOTE — Discharge Instructions (Signed)

## 2012-03-23 IMAGING — CT CT ABD-PELV W/ CM
2 of 4 series · 15 of 46 positions shown, 17 images · IV contrast (Omnipaque 300)
Comparison: None.

CLINICAL DATA: Chest pain.  Epigastric pain and vomiting.  The
patient is chronically on dialysis, for the past 4 years.

CT ABDOMEN AND PELVIS WITH CONTRAST
TECHNIQUE: Multidetector CT imaging of the abdomen and pelvis was
performed following the standard protocol during bolus
administration of intravenous contrast.
Contrast: Abdomen pelvis CT 10/13/2008.  Chest radiograph
09/08/2010.

[Series 3: abd_pel_with 5.0 b40f · axial · 0.84mm/px · z∈[+80,+500]mm · 12 of 93 slices shown, 14 images]
[im 5/93  soft-tissue]
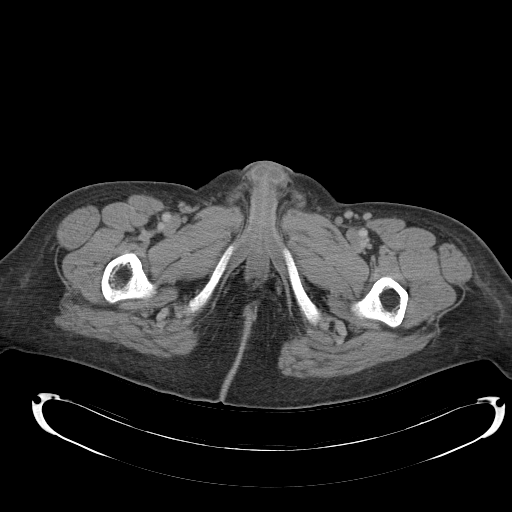
[im 5/93  bone]
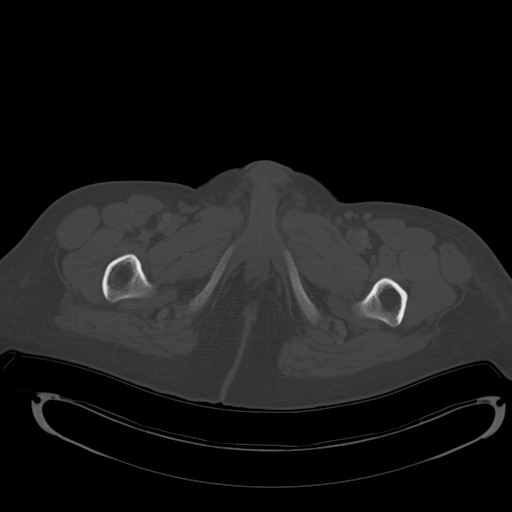
[im 13/93  soft-tissue]
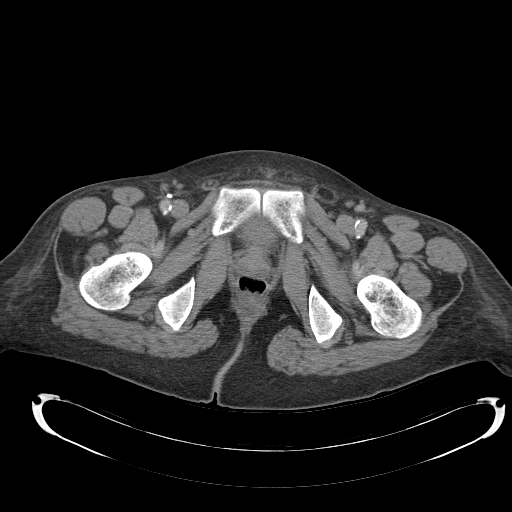
[im 21/93  soft-tissue]
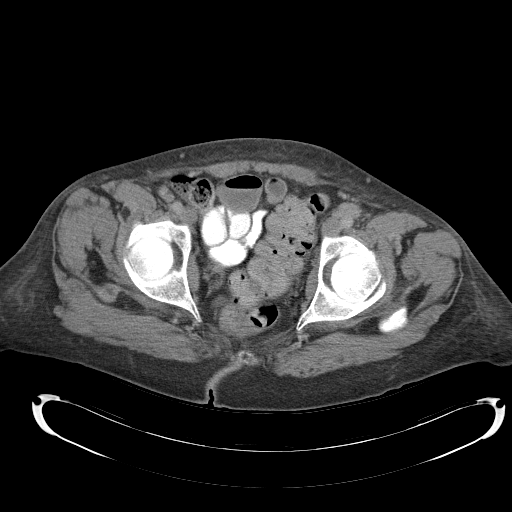
[im 29/93  soft-tissue]
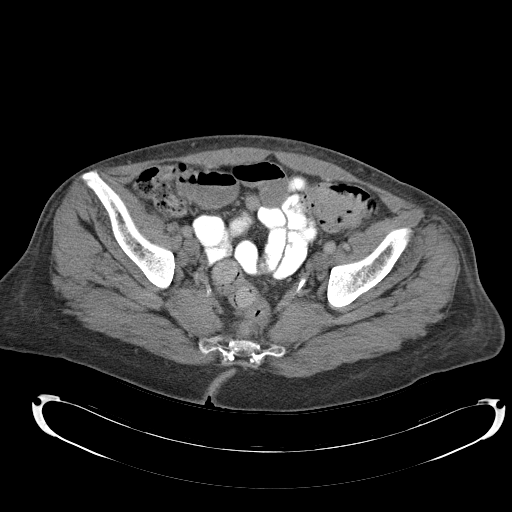
[im 37/93  soft-tissue]
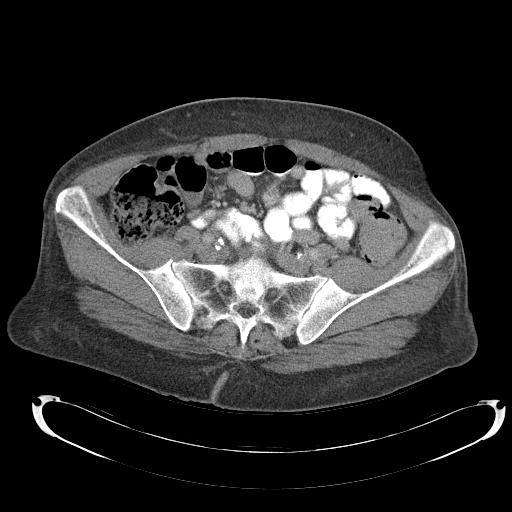
[im 45/93  soft-tissue]
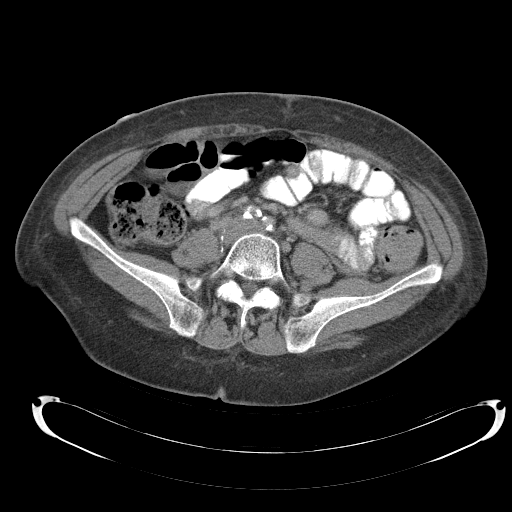
[im 49/93  soft-tissue]
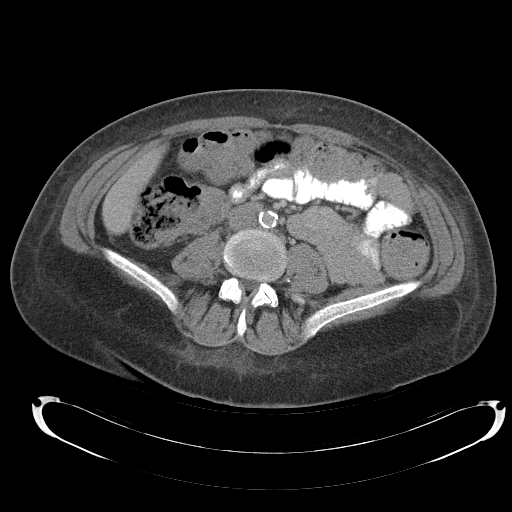
[im 57/93  soft-tissue]
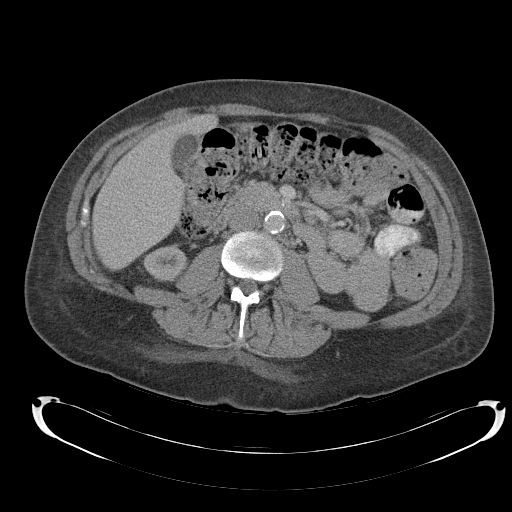
[im 65/93  soft-tissue]
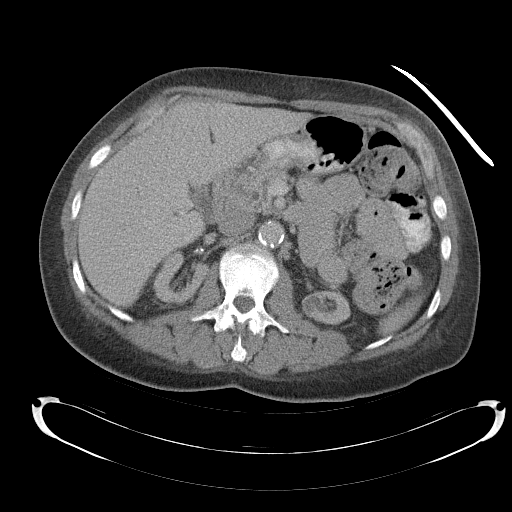
[im 65/93  bone]
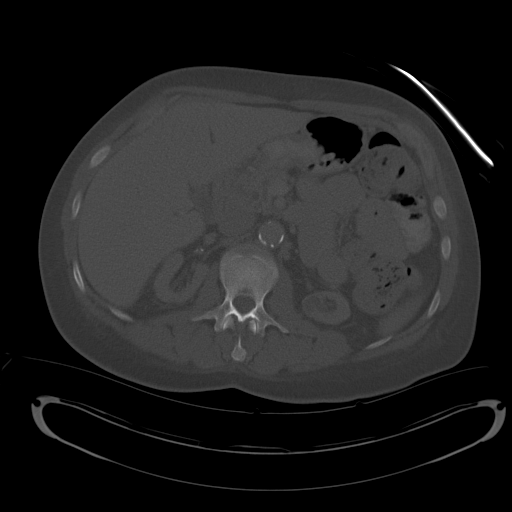
[im 73/93  soft-tissue]
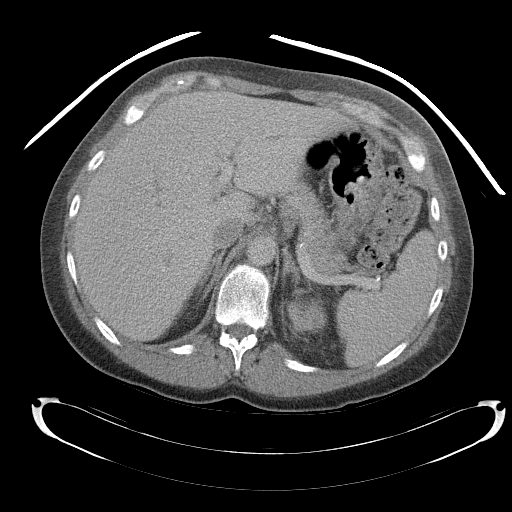
[im 81/93  soft-tissue]
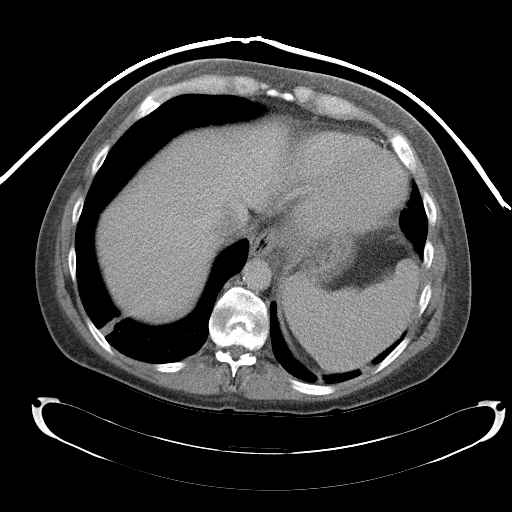
[im 89/93  soft-tissue]
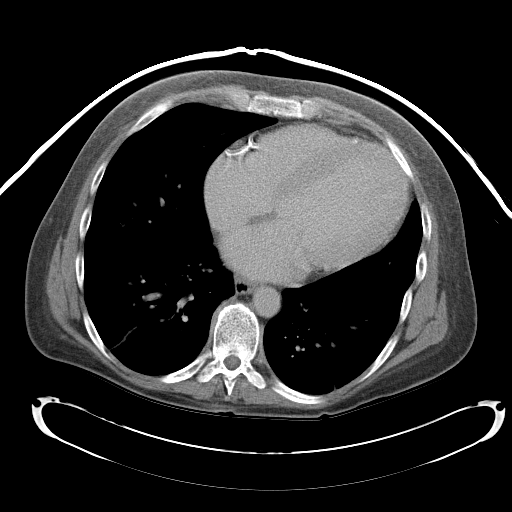

[Series 6: abd_pel_with 3.0 spo cor · coronal · 0.61mm/px · 3 of 91 slices shown]
[im 31/91  soft-tissue]
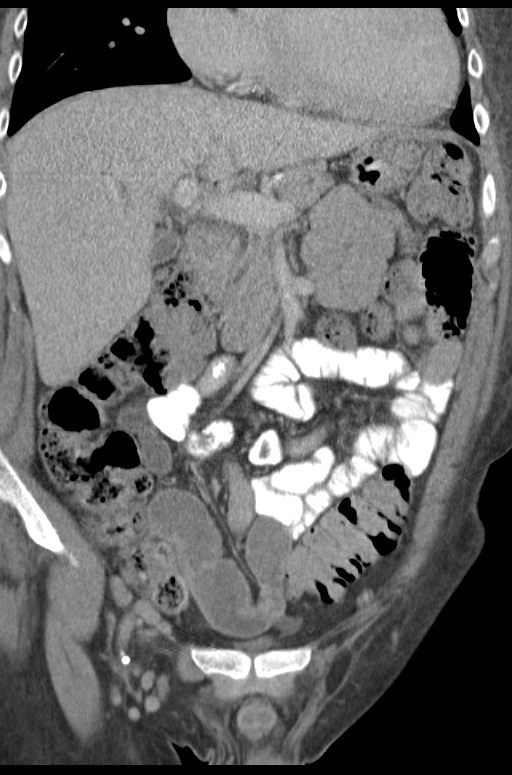
[im 41/91  soft-tissue]
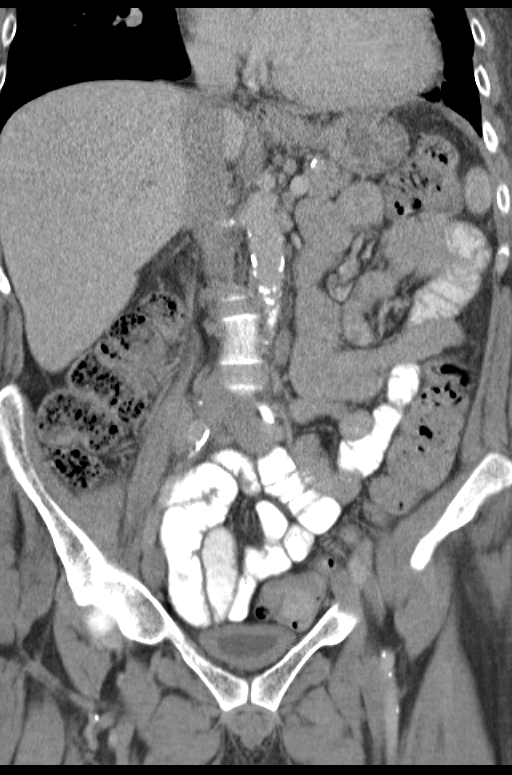
[im 51/91  soft-tissue]
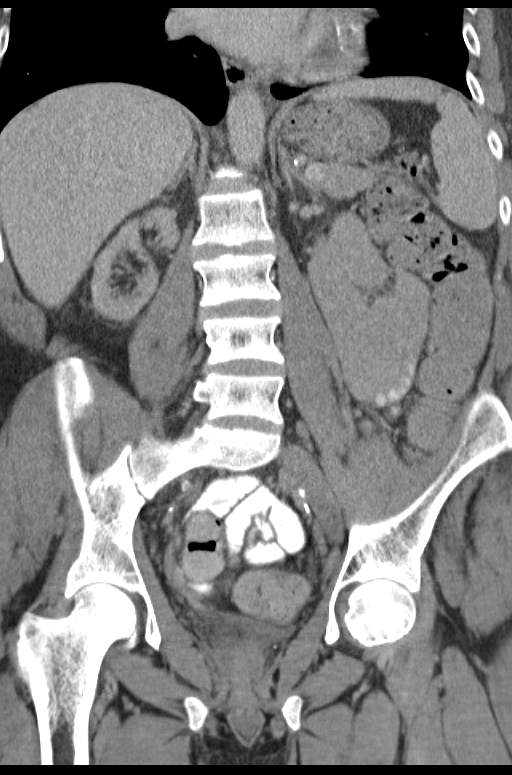

[15 of 46 positions shown; findings below may reference images not displayed]

FINDINGS: Linear atelectasis scarring is present in the lower lobes
bilaterally.  Negative for airspace disease or effusion.
Cardiomegaly and coronary artery atherosclerosis is partially
imaged.

The liver, gallbladder, spleen, adrenal glands, and pancreas are
within normal limits and stable in appearance.

Both kidneys are markedly atrophic, consistent with end-stage renal
disease.

The stomach is decompressed.  The duodenum, jejunum, and ileum have
normal appearances.  Not all of the small bowel loops are opacified
with oral contrast at the time imaging.  No focal bowel wall
thickening or mesenteric inflammatory changes identified.

There is a prominent amount of stool throughout the colon.  No
evidence of fecal impaction or obstruction.  No colonic wall
thickening.

Heavy atherosclerotic calcification of the abdominal aorta and
iliac vasculature bilaterally.

Urinary bladder is decompressed. The appendix is not discretely
visualized.  No pericecal inflammatory changes.  Very small amount
of free fluid in the pelvis appears similar compared to exam of
3556. Normal prostate and seminal vesicles.

Negative for lymphadenopathy or free air. There is mild diffuse
edema of the body wall.

Density of the bones suggests renal osteodystrophy.  Stable
endplate degenerative change/Schmorl's node in the distal thoracic
and lumbar spine.  No acute or suspicious bony abnormality.
IMPRESSION: 1.  Prominent amount of stool in the colon without obstruction or
impaction.  Question if the patient has a history of constipation.

2.  Mild diffuse body wall edema.
3.  Very small amount of free fluid in the pelvis, similar to prior
CT of [DATE].  Coronary artery atherosclerosis with cardiomegaly and heavy
atherosclerosis of the aortoiliac vasculature, much greater than
expected for patient age.
5.  Renal osteodystrophy.

## 2012-03-23 IMAGING — CR DG CHEST 2V
2 series · 2 of 2 positions shown · non-contrast
Comparison: 09/04/2010

CLINICAL DATA: Chest pain

CHEST - 2 VIEW

[view not recorded (1 of 2)]
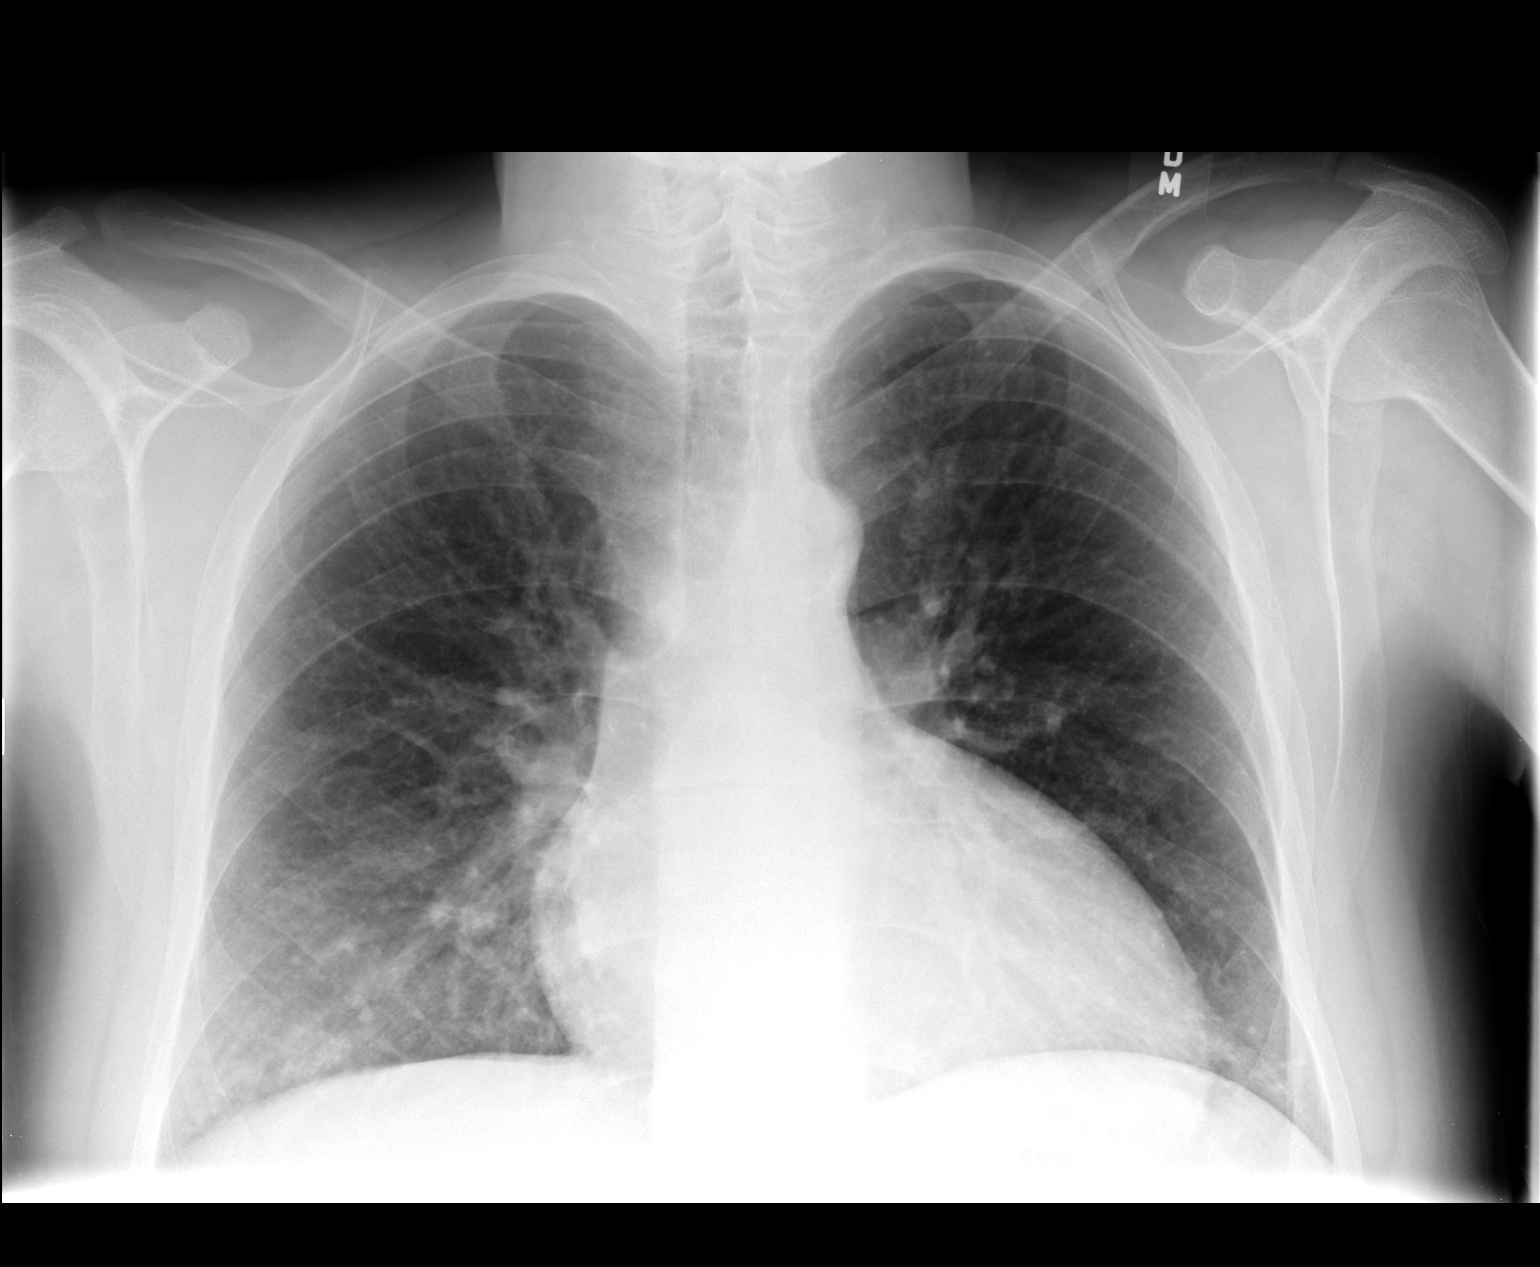

[view not recorded (2 of 2)]
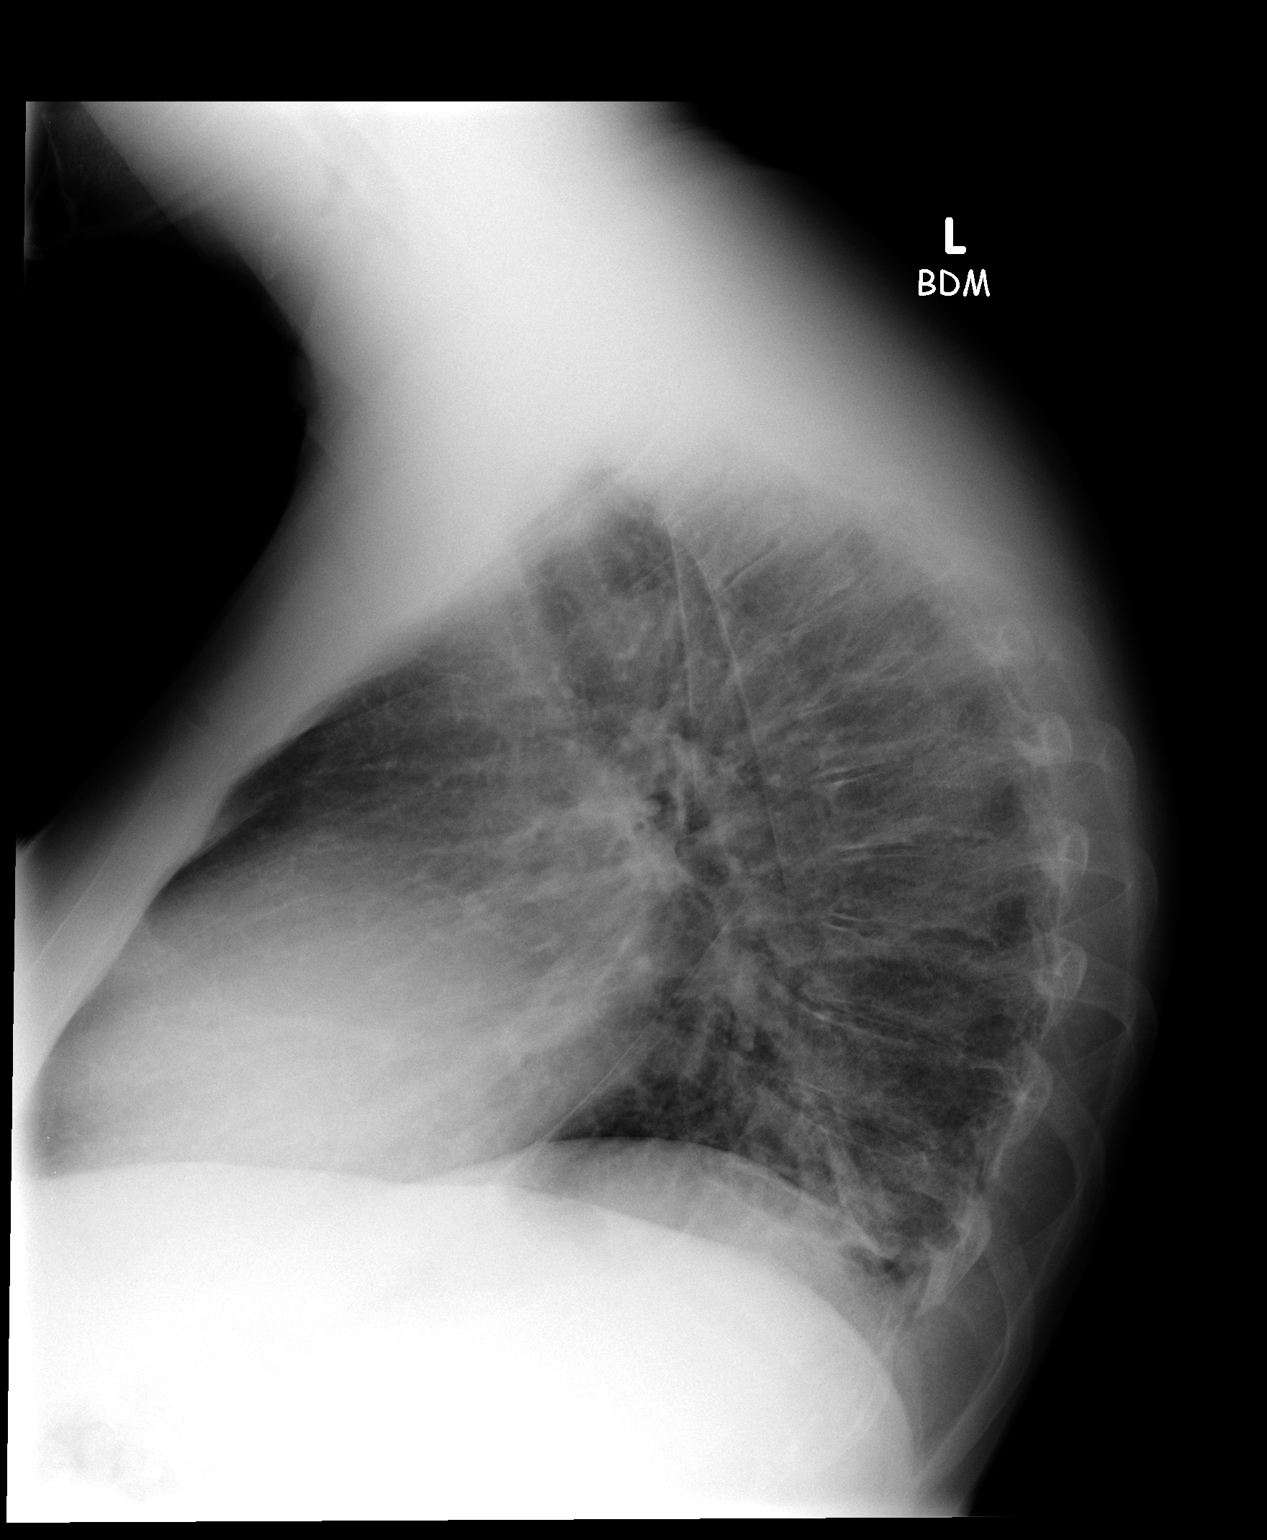

[2 of 2 positions shown; findings below may reference images not displayed]

FINDINGS: Stable enlargement cardiopericardial silhouette.  Mild
pulmonary venous congestion without edema.  No focal airspace
disease, effusion, or pneumothorax.  Mild kyphosis of the thoracic
spine without discrete thoracic spine fracture.
IMPRESSION: Cardiomegaly and pulmonary venous congestion.

## 2012-03-24 ENCOUNTER — Encounter (HOSPITAL_COMMUNITY): Payer: Self-pay | Admitting: *Deleted

## 2012-03-24 ENCOUNTER — Emergency Department (HOSPITAL_COMMUNITY)
Admission: EM | Admit: 2012-03-24 | Discharge: 2012-03-24 | Discharge status: Against Medical Advice | Payer: Medicare Other | Attending: Emergency Medicine | Admitting: Emergency Medicine

## 2012-03-24 DIAGNOSIS — F209 Schizophrenia, unspecified: Secondary | ICD-10-CM | POA: Insufficient documentation

## 2012-03-24 DIAGNOSIS — F17200 Nicotine dependence, unspecified, uncomplicated: Secondary | ICD-10-CM | POA: Insufficient documentation

## 2012-03-24 DIAGNOSIS — N186 End stage renal disease: Secondary | ICD-10-CM | POA: Insufficient documentation

## 2012-03-24 DIAGNOSIS — D649 Anemia, unspecified: Secondary | ICD-10-CM | POA: Insufficient documentation

## 2012-03-24 DIAGNOSIS — J449 Chronic obstructive pulmonary disease, unspecified: Secondary | ICD-10-CM | POA: Insufficient documentation

## 2012-03-24 DIAGNOSIS — J4489 Other specified chronic obstructive pulmonary disease: Secondary | ICD-10-CM | POA: Insufficient documentation

## 2012-03-24 DIAGNOSIS — F319 Bipolar disorder, unspecified: Secondary | ICD-10-CM | POA: Insufficient documentation

## 2012-03-24 DIAGNOSIS — I120 Hypertensive chronic kidney disease with stage 5 chronic kidney disease or end stage renal disease: Secondary | ICD-10-CM | POA: Insufficient documentation

## 2012-03-24 DIAGNOSIS — Z7982 Long term (current) use of aspirin: Secondary | ICD-10-CM | POA: Insufficient documentation

## 2012-03-24 DIAGNOSIS — G894 Chronic pain syndrome: Secondary | ICD-10-CM | POA: Insufficient documentation

## 2012-03-24 DIAGNOSIS — Z79899 Other long term (current) drug therapy: Secondary | ICD-10-CM | POA: Insufficient documentation

## 2012-03-24 DIAGNOSIS — R109 Unspecified abdominal pain: Secondary | ICD-10-CM | POA: Insufficient documentation

## 2012-03-24 DIAGNOSIS — Z992 Dependence on renal dialysis: Secondary | ICD-10-CM | POA: Insufficient documentation

## 2012-03-24 NOTE — ED Notes (Signed)
Pt requesting to leave.  States that he has dialysis this morning and doesn't want to stay for tests and miss his treatment.  Dr. Judd Lien aware. Pt signed AMA form.
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
## 2012-03-24 NOTE — ED Notes (Signed)
Pt c/o back pain and periumbilical pain, reports he picked up his mother out of the floor approx 1 week ago
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
## 2012-03-28 IMAGING — CR DG ABDOMEN ACUTE W/ 1V CHEST
3 series · 3 of 3 positions shown · non-contrast
Comparison: None.

CLINICAL DATA: Abdominal pain

ACUTE ABDOMEN SERIES (ABDOMEN 2 VIEW & CHEST 1 VIEW)

[view not recorded (1 of 3)]
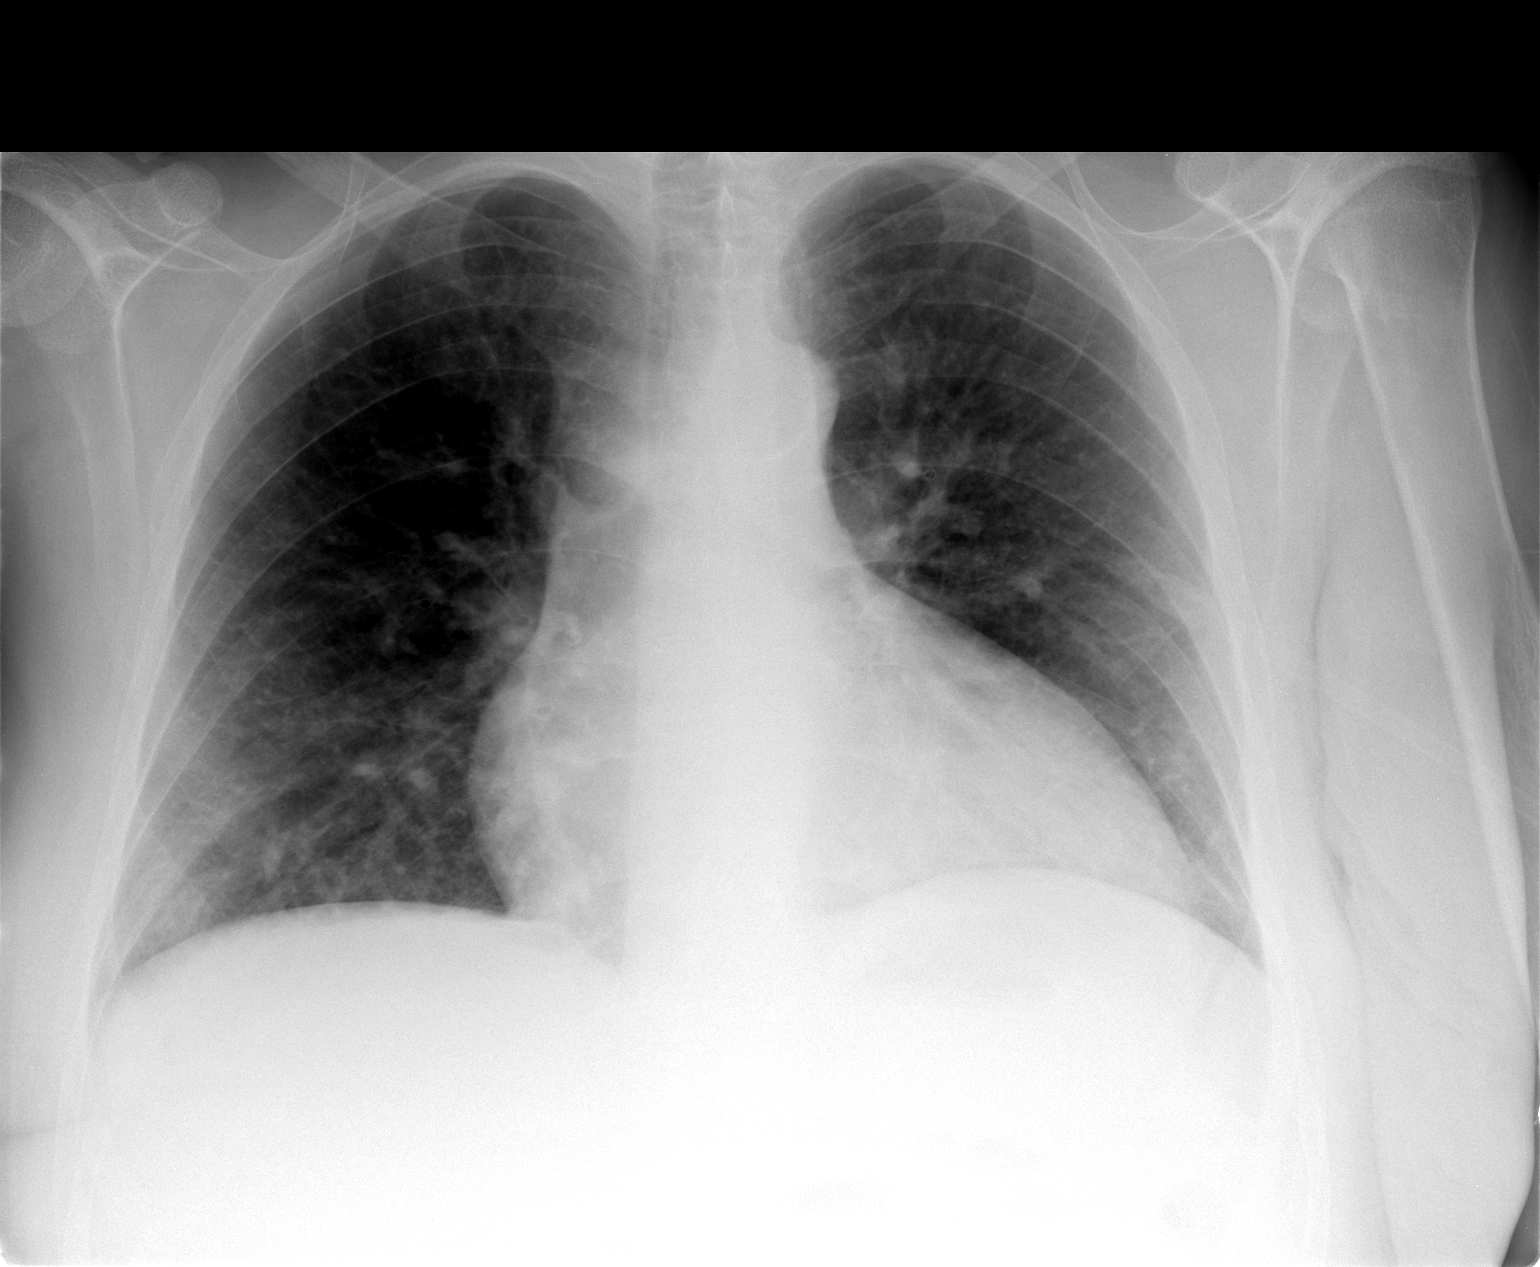

[view not recorded (2 of 3)]
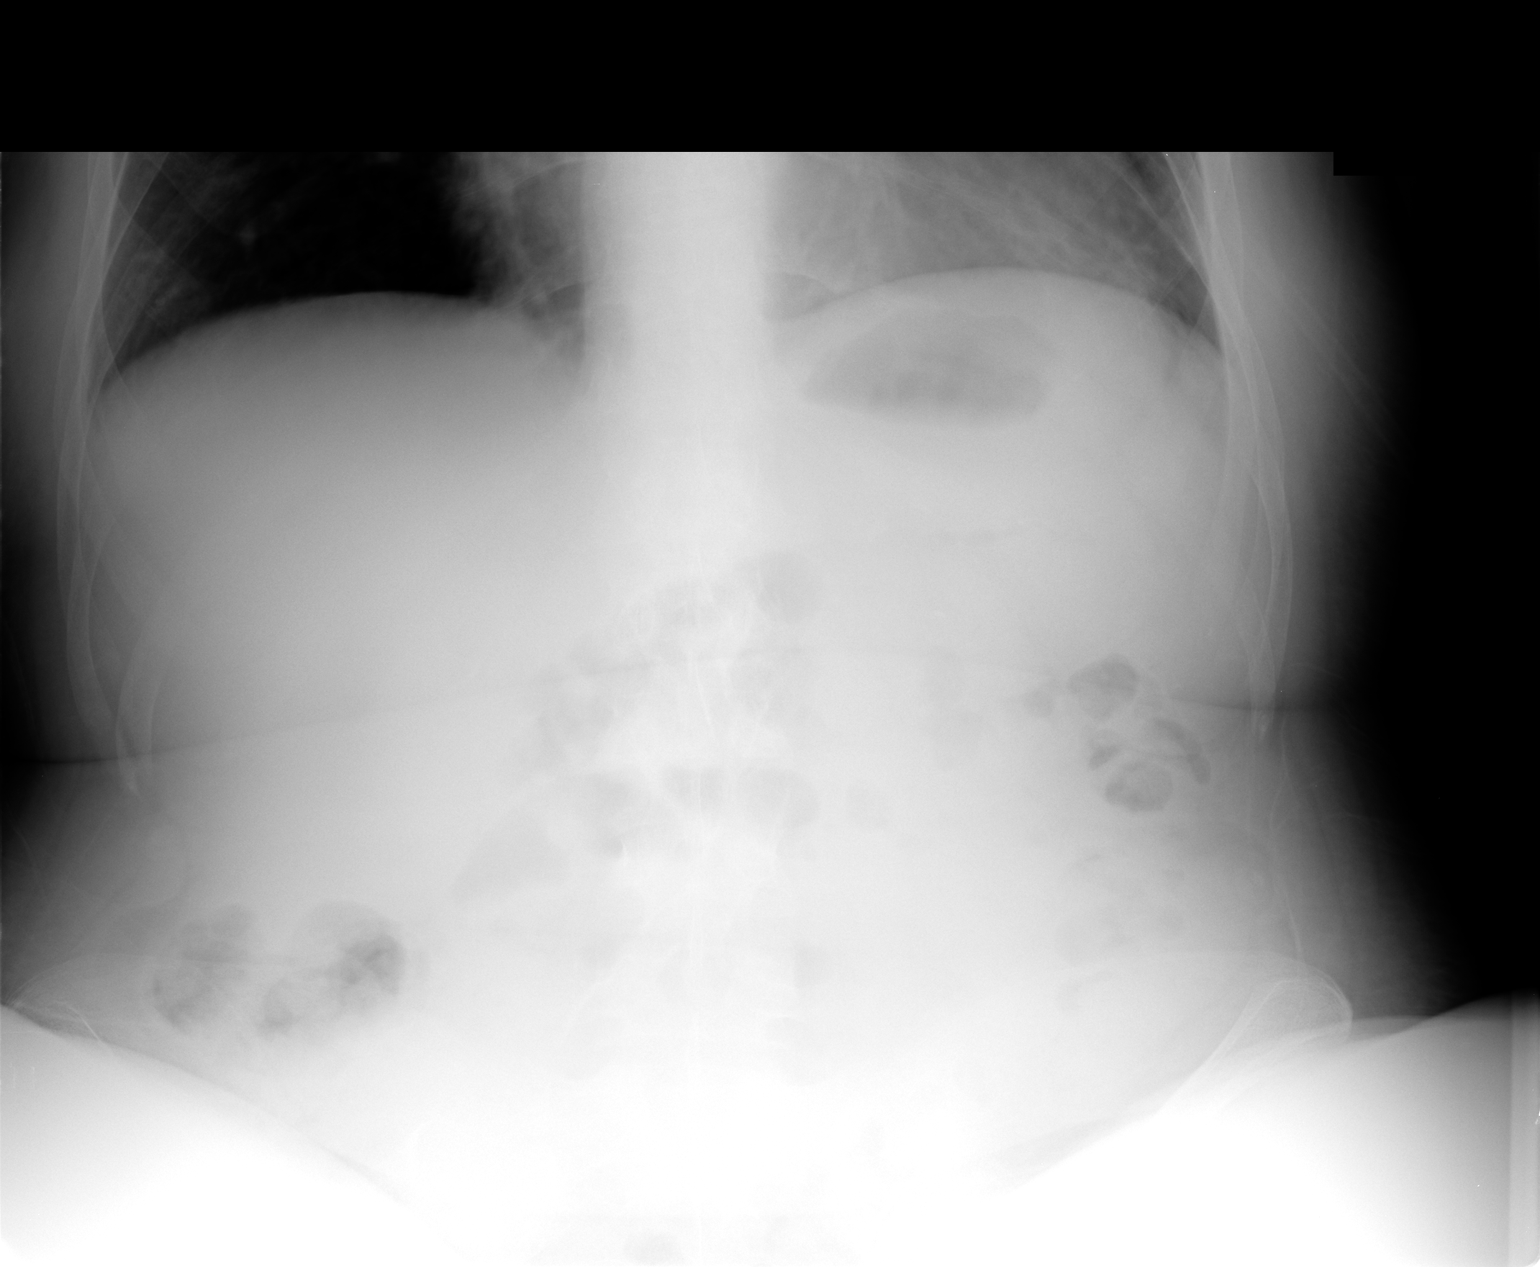

[view not recorded (3 of 3)]
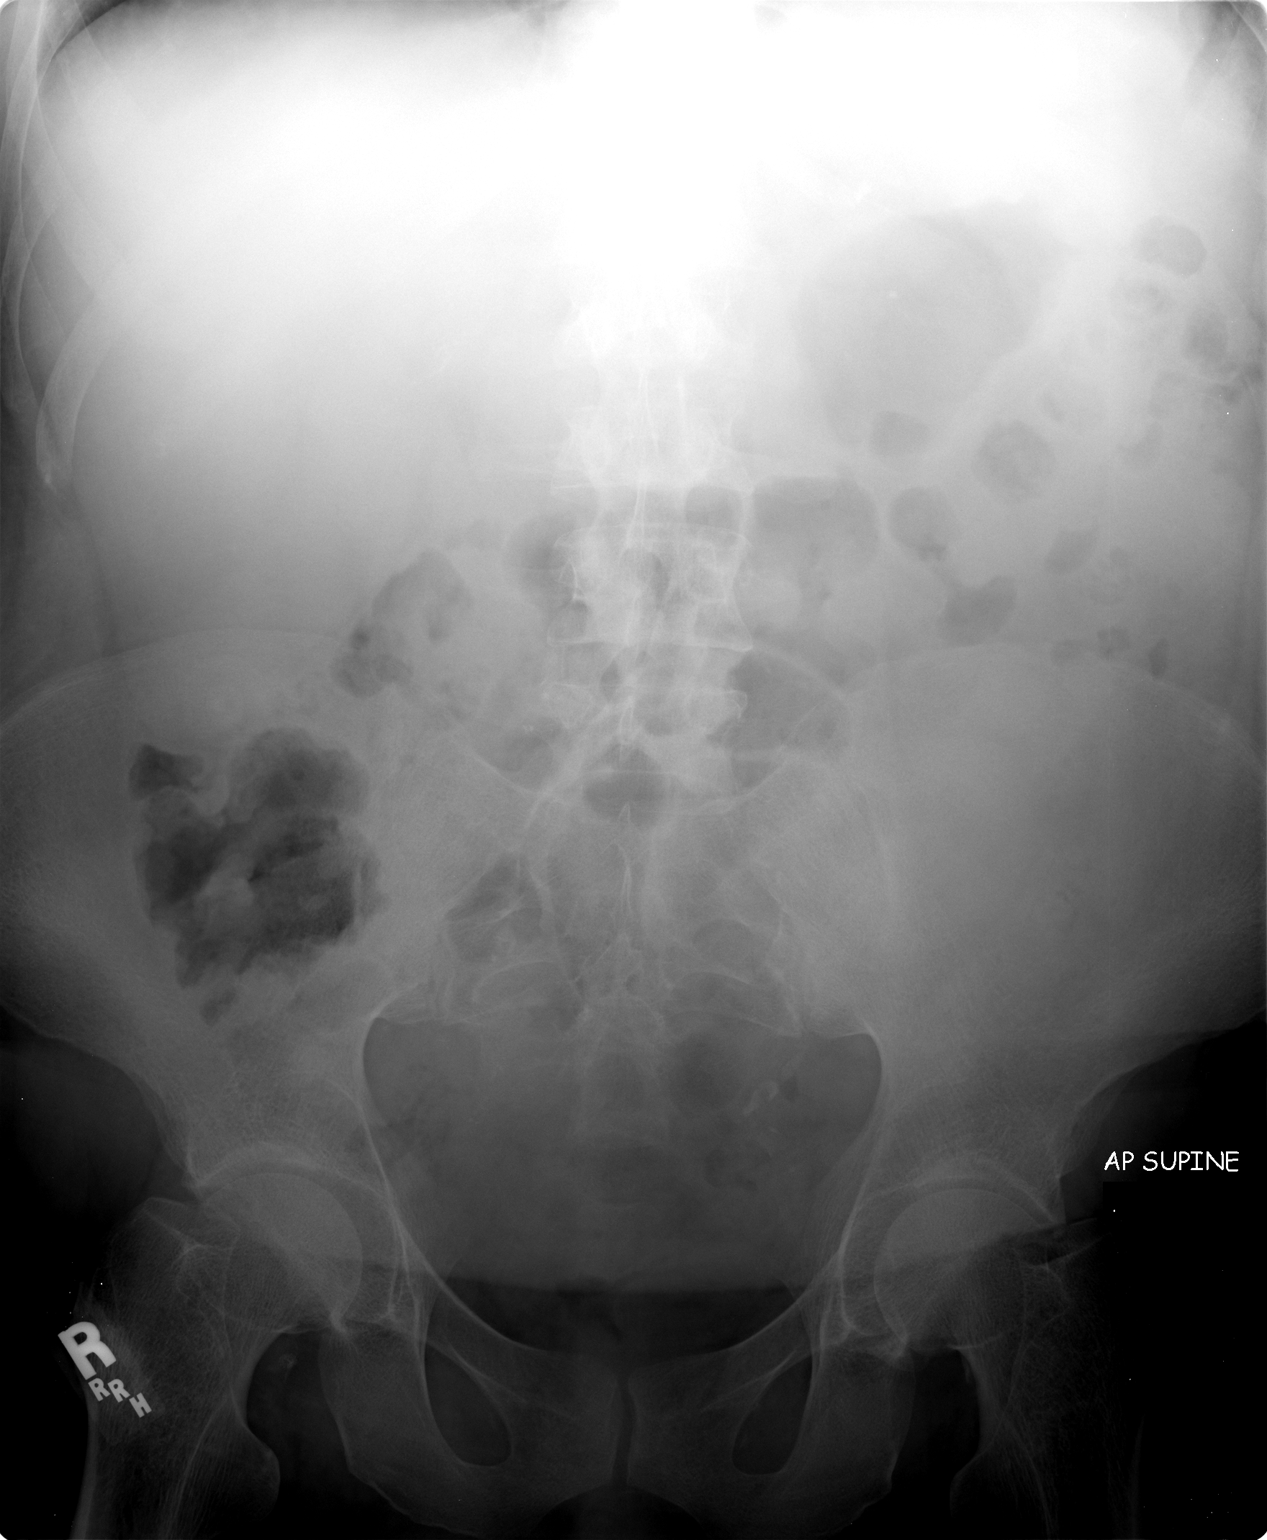

[3 of 3 positions shown; findings below may reference images not displayed]

FINDINGS: Borderline cardiomegaly without congestive heart failure
or active disease in one-view.

No free air or acute/specific abnormality of the bowel gas pattern.
Psoas margins intact.  No pathological calcifications.  Osseous
structures intact.
IMPRESSION: 1.  Borderline cardiomegaly - no active disease in one-view.
2.  No acute or specific abdominal findings.

## 2012-03-30 IMAGING — CR DG ABDOMEN ACUTE W/ 1V CHEST
3 series · 3 of 3 positions shown · non-contrast
Comparison: [DATE]

CLINICAL DATA: Diarrhea.  Vomiting.  Abdominal pain.

ACUTE ABDOMEN SERIES (ABDOMEN 2 VIEW & CHEST 1 VIEW)

[view not recorded (1 of 3)]
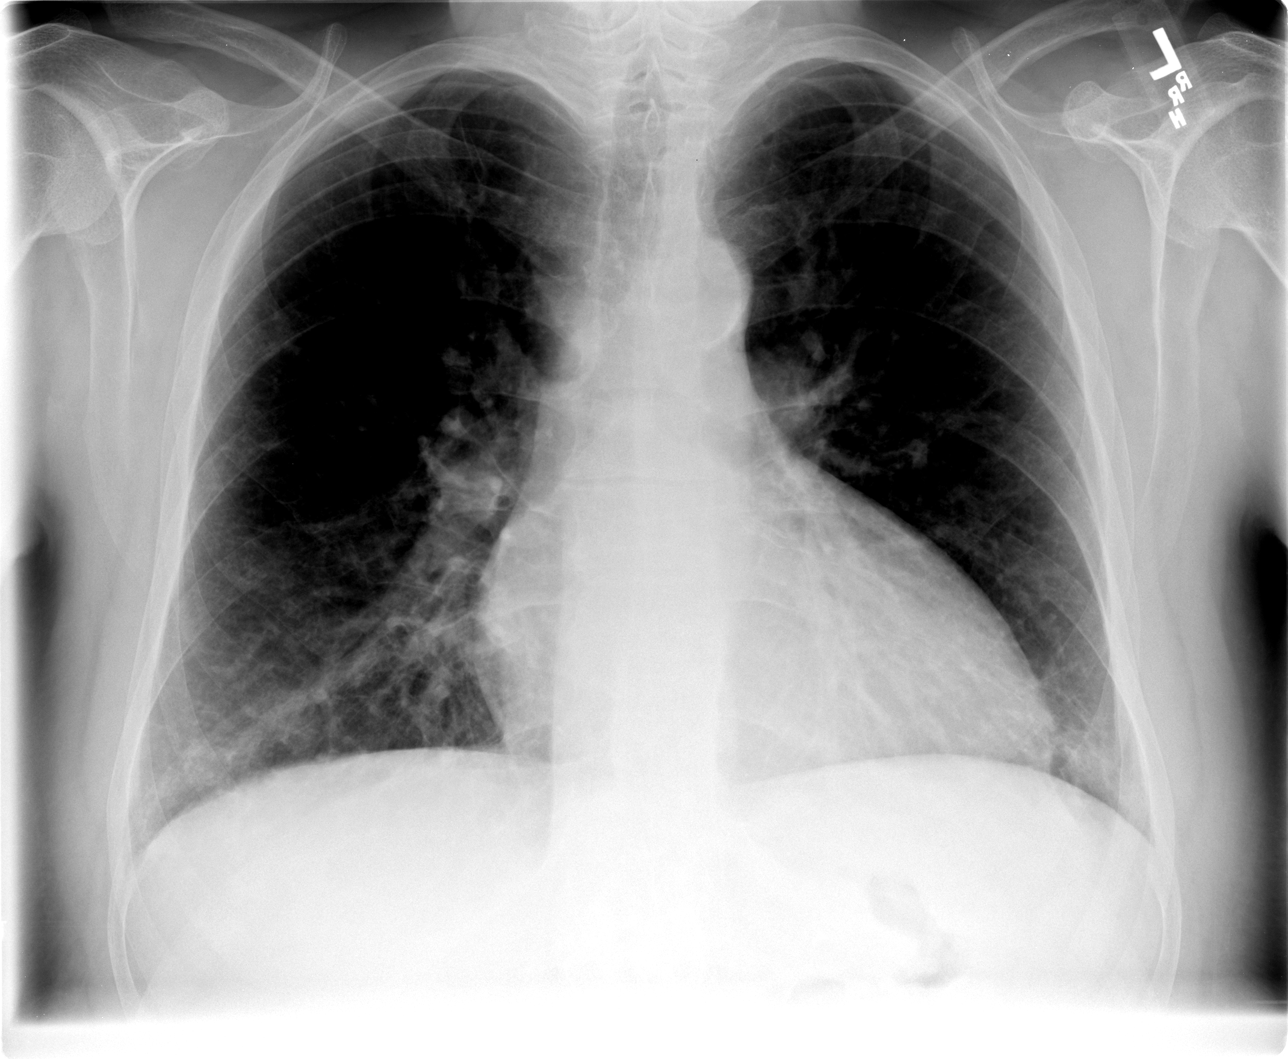

[view not recorded (2 of 3)]
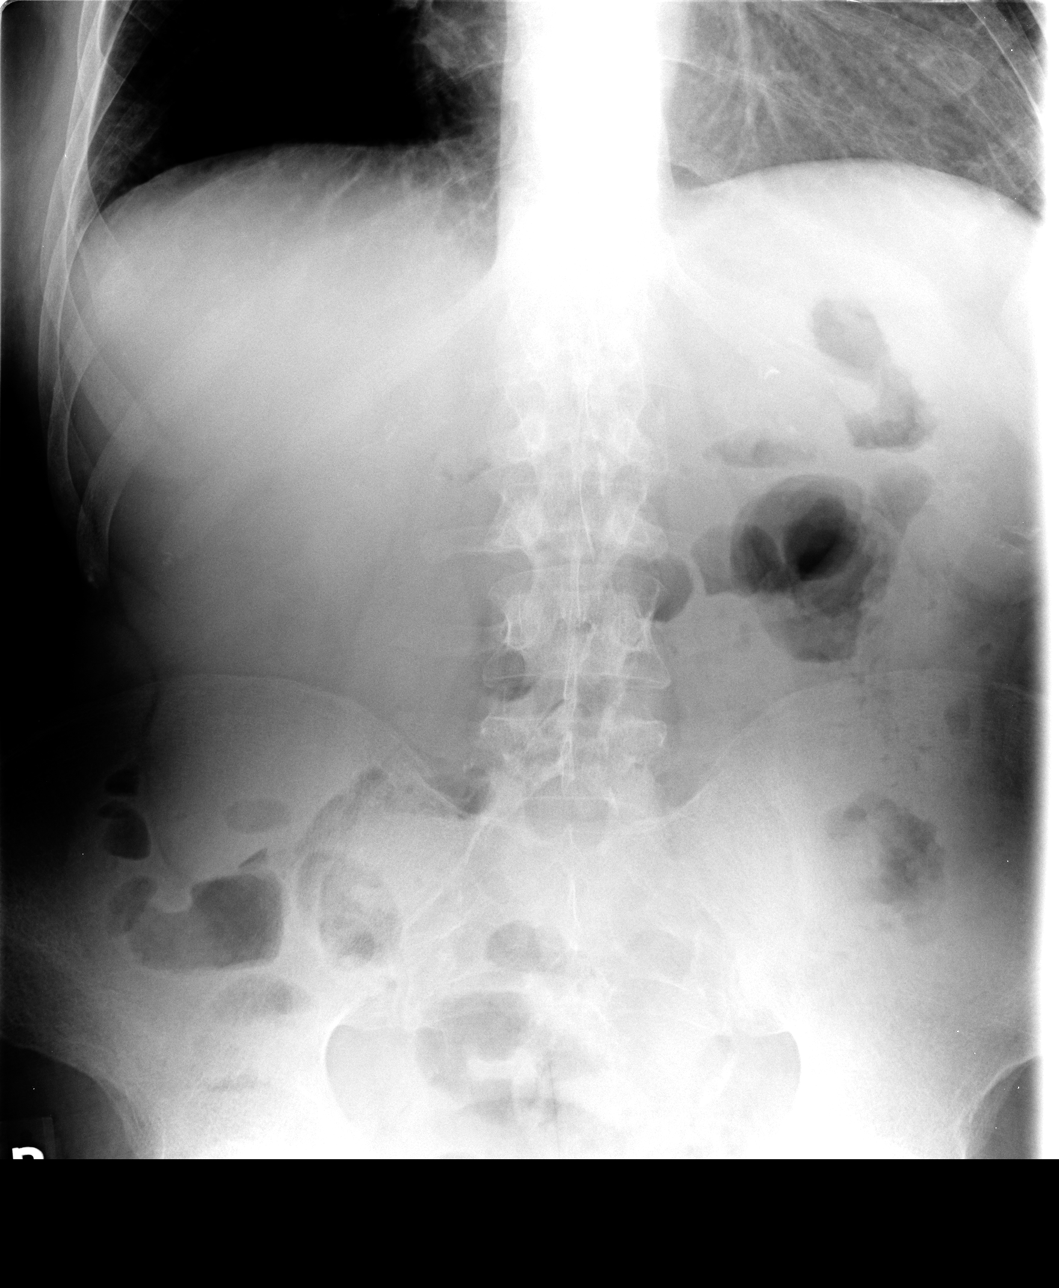

[view not recorded (3 of 3)]
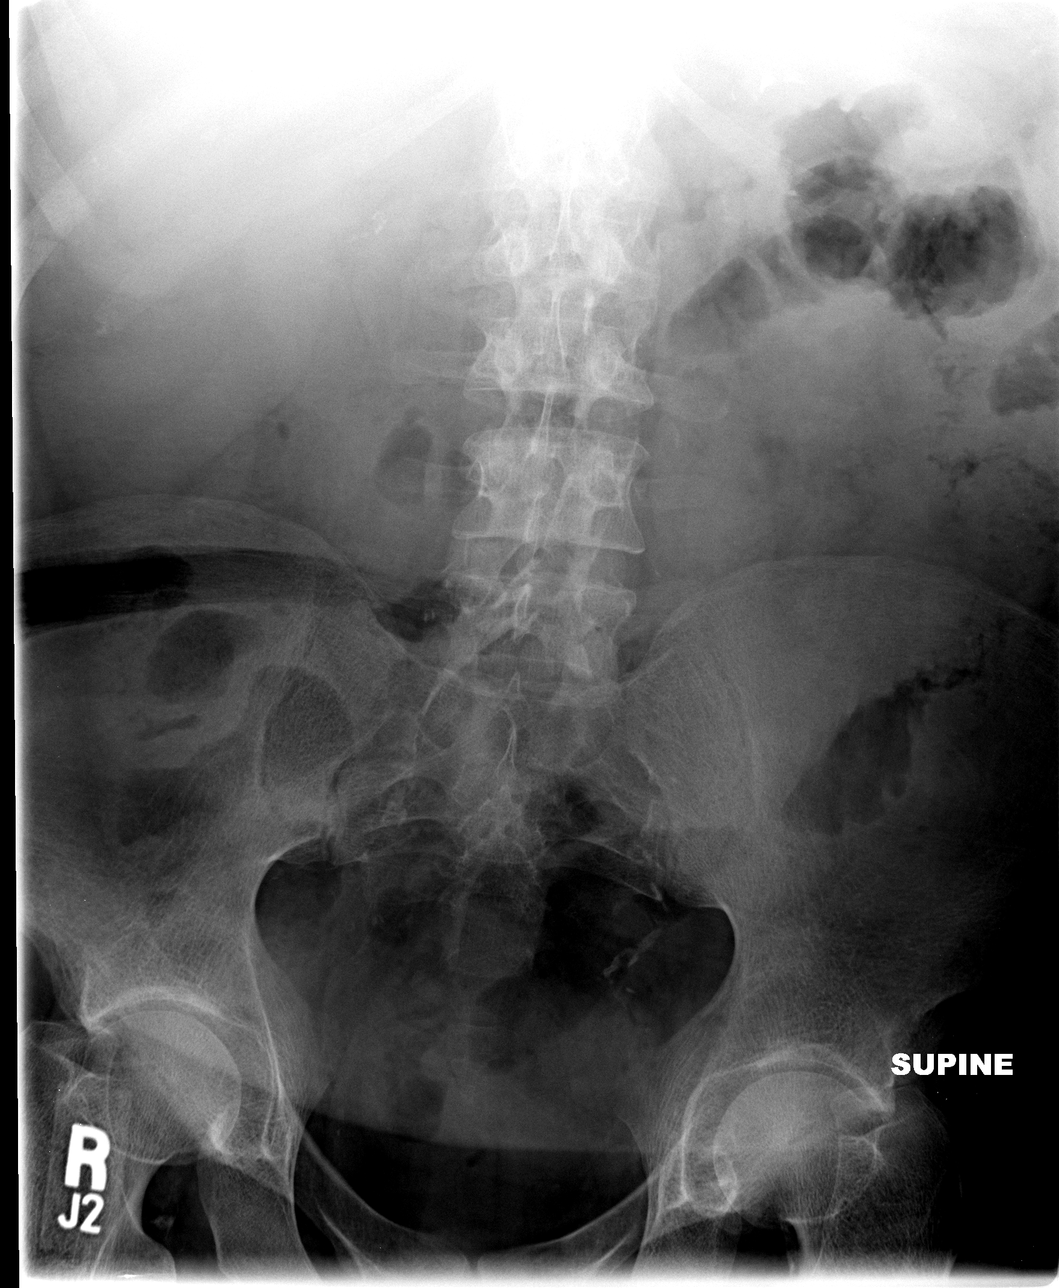

[3 of 3 positions shown; findings below may reference images not displayed]

FINDINGS: Bowel gas pattern is normal without evidence of ileus,
obstruction or free air.  There are vascular calcifications .  No
other worrisome calcification.  No acute bony finding.

One-view chest shows mild cardiomegaly.  There are chronic
interstitial lung markings.  There is vascular calcification.  No
free air under the diaphragm.
IMPRESSION: No acute finding.  Premature atherosclerosis.

## 2012-03-30 NOTE — ED Provider Notes (Signed)
Medical screening examination/treatment/procedure(s) were performed by non-physician practitioner and as supervising physician I was immediately available for consultation/collaboration.   Aniyia Rane L Marcus Schwandt, MD 03/30/12 1816 

## 2012-04-03 IMAGING — CR DG CHEST 1V PORT
1 series · 1 of 1 positions shown · non-contrast
Comparison: 09/08/2010

CLINICAL DATA: Shortness of breath and difficulty breathing

PORTABLE CHEST - 1 VIEW

[view not recorded]
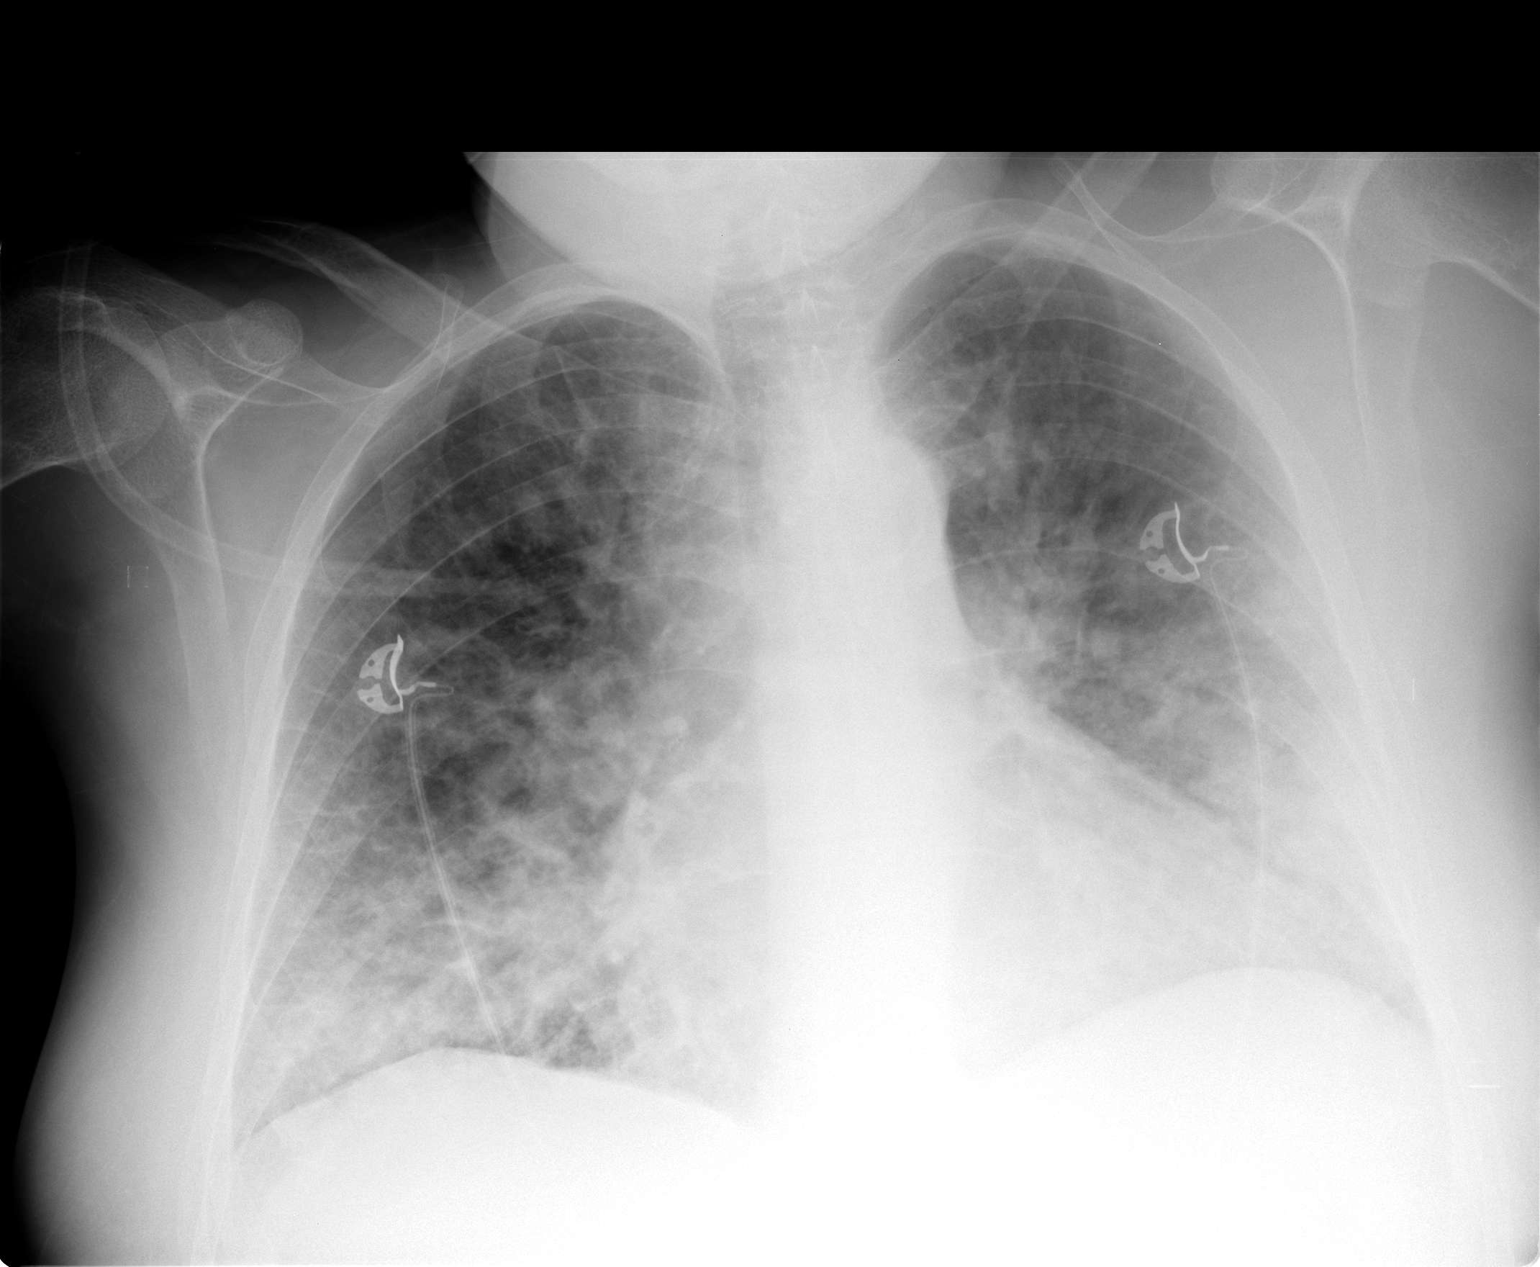

[1 of 1 positions shown; findings below may reference images not displayed]

FINDINGS: The lung volumes are low.  The heart  is enlarged.  There
is interval appearance of diffuse perihilar interstitial airspace
opacities with more confluent opacity seen in the right lower lobe.
No large pleural effusions are seen.  The upper abdomen and osseous
structures within normal limits.
IMPRESSION: Low lung volumes.  Cardiomegaly with findings most compatible with
pulmonary edema.  Superimposed infection cannot be excluded at the
right lung base.

## 2012-04-04 IMAGING — CR DG CHEST 2V
2 series · 2 of 2 positions shown · non-contrast
Comparison: 09/19/2010

CLINICAL DATA: Chest pain.  Reassess right lung base.

CHEST - 2 VIEW

[view not recorded (1 of 2)]
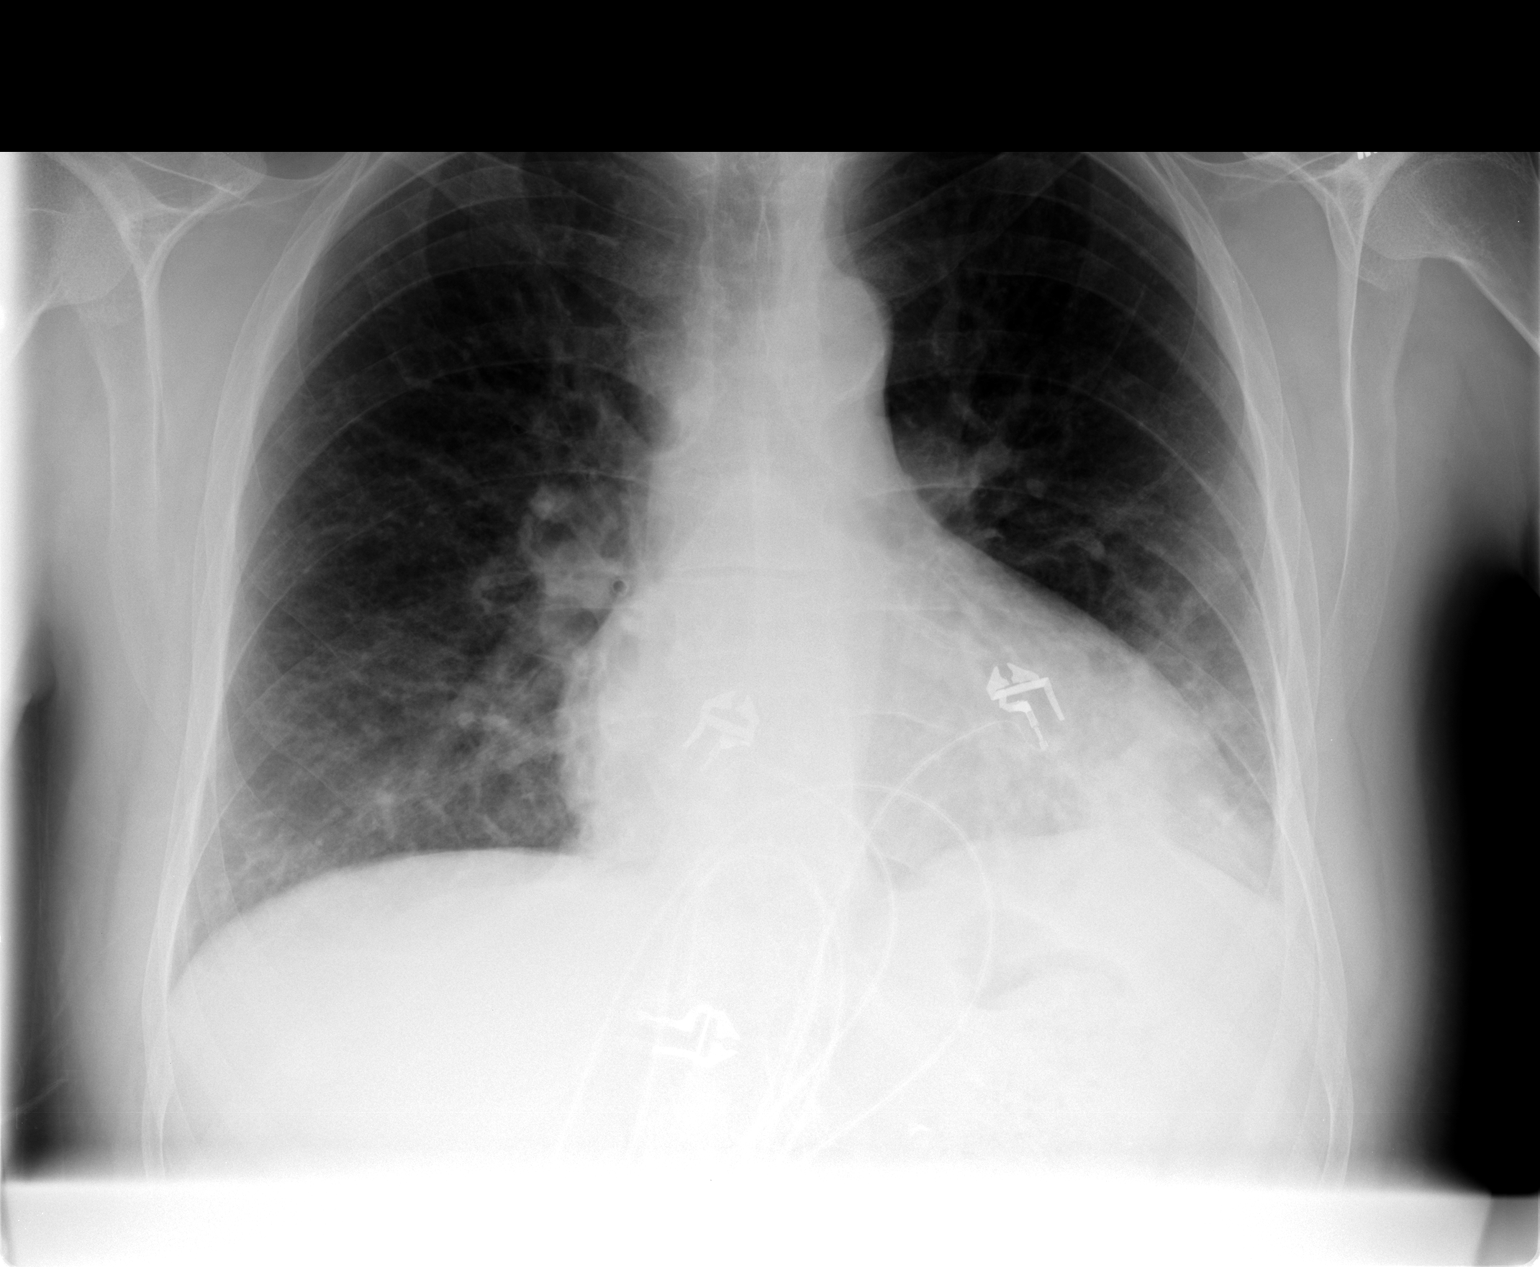

[view not recorded (2 of 2)]
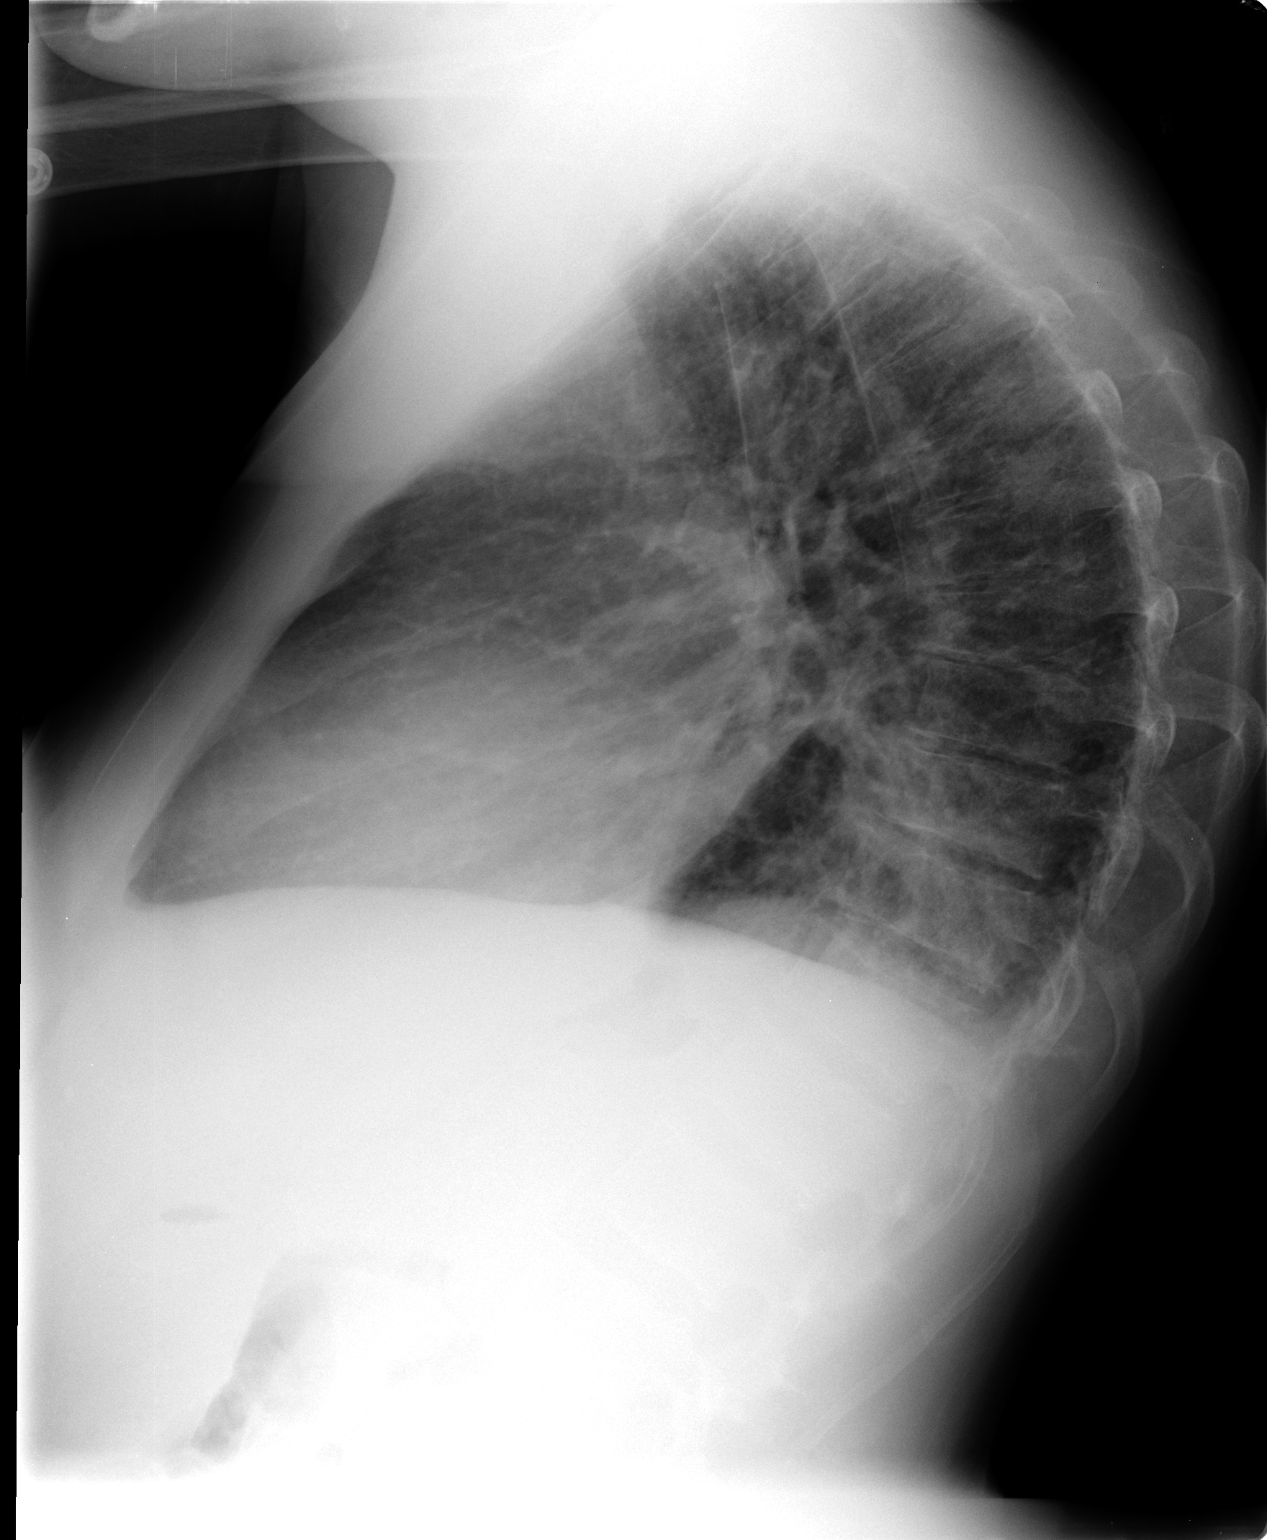

[2 of 2 positions shown; findings below may reference images not displayed]

FINDINGS: Trachea is midline.  Heart is enlarged, stable.  Thoracic
aorta is calcified.  There has been interval improvement in mixed
interstitial and airspace disease, without complete resolution.
Focal airspace consolidation in the left lower lobe.  No definite
pleural fluid.
IMPRESSION: Improving edema.  Residual focal airspace consolidation in the left
lower lobe.

## 2012-04-07 IMAGING — CR DG ABDOMEN ACUTE W/ 1V CHEST
4 series · 4 of 4 positions shown · non-contrast
Comparison: 06/15/2010

CLINICAL DATA: Abdominal pain.

ACUTE ABDOMEN SERIES (ABDOMEN 2 VIEW & CHEST 1 VIEW)

[view not recorded (1 of 4)]
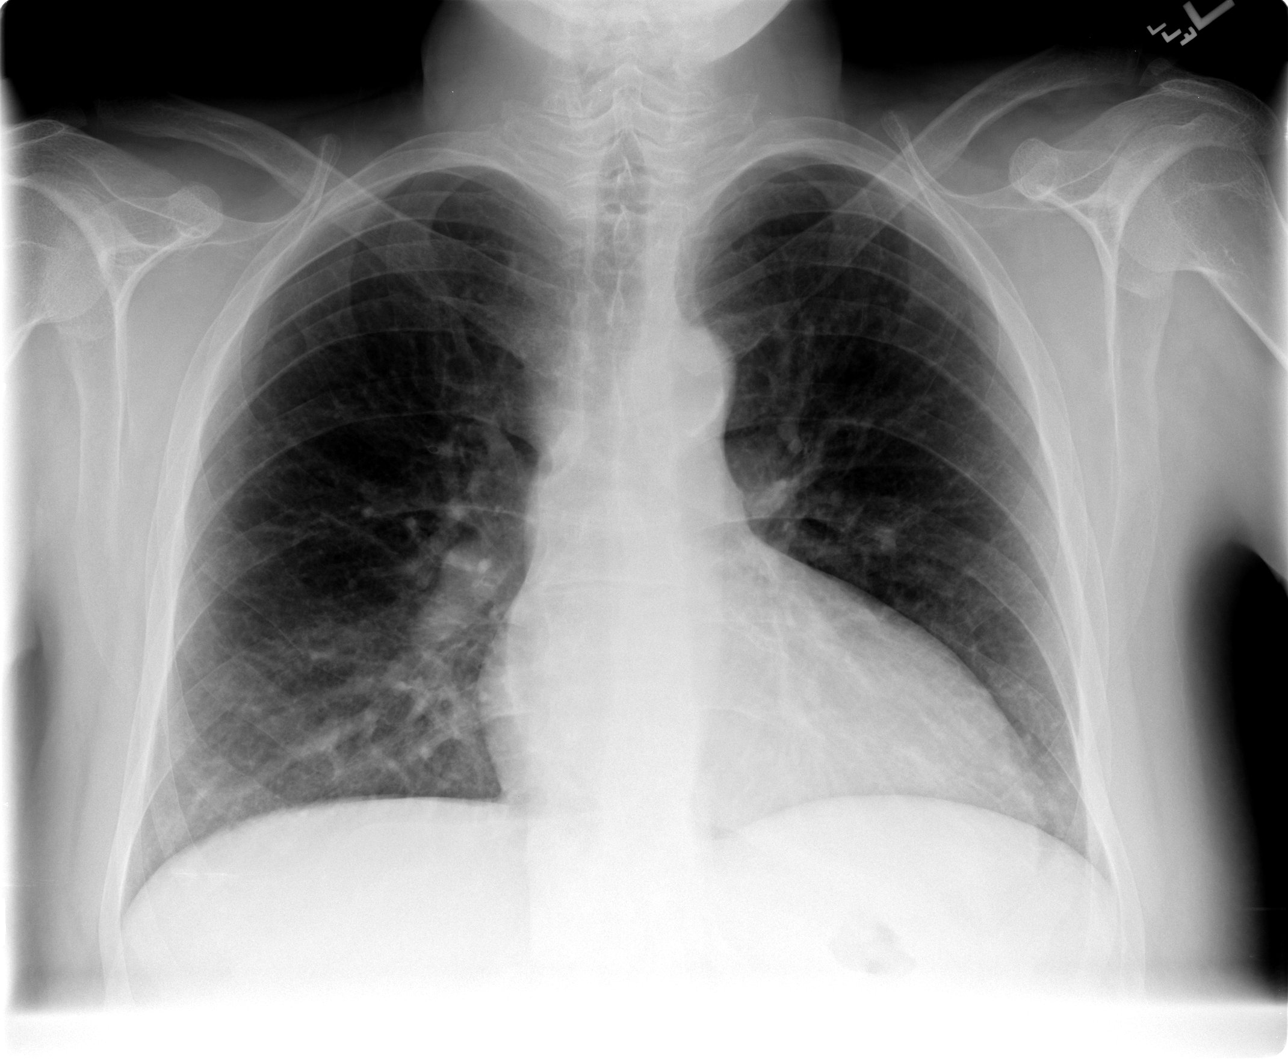

[view not recorded (2 of 4)]
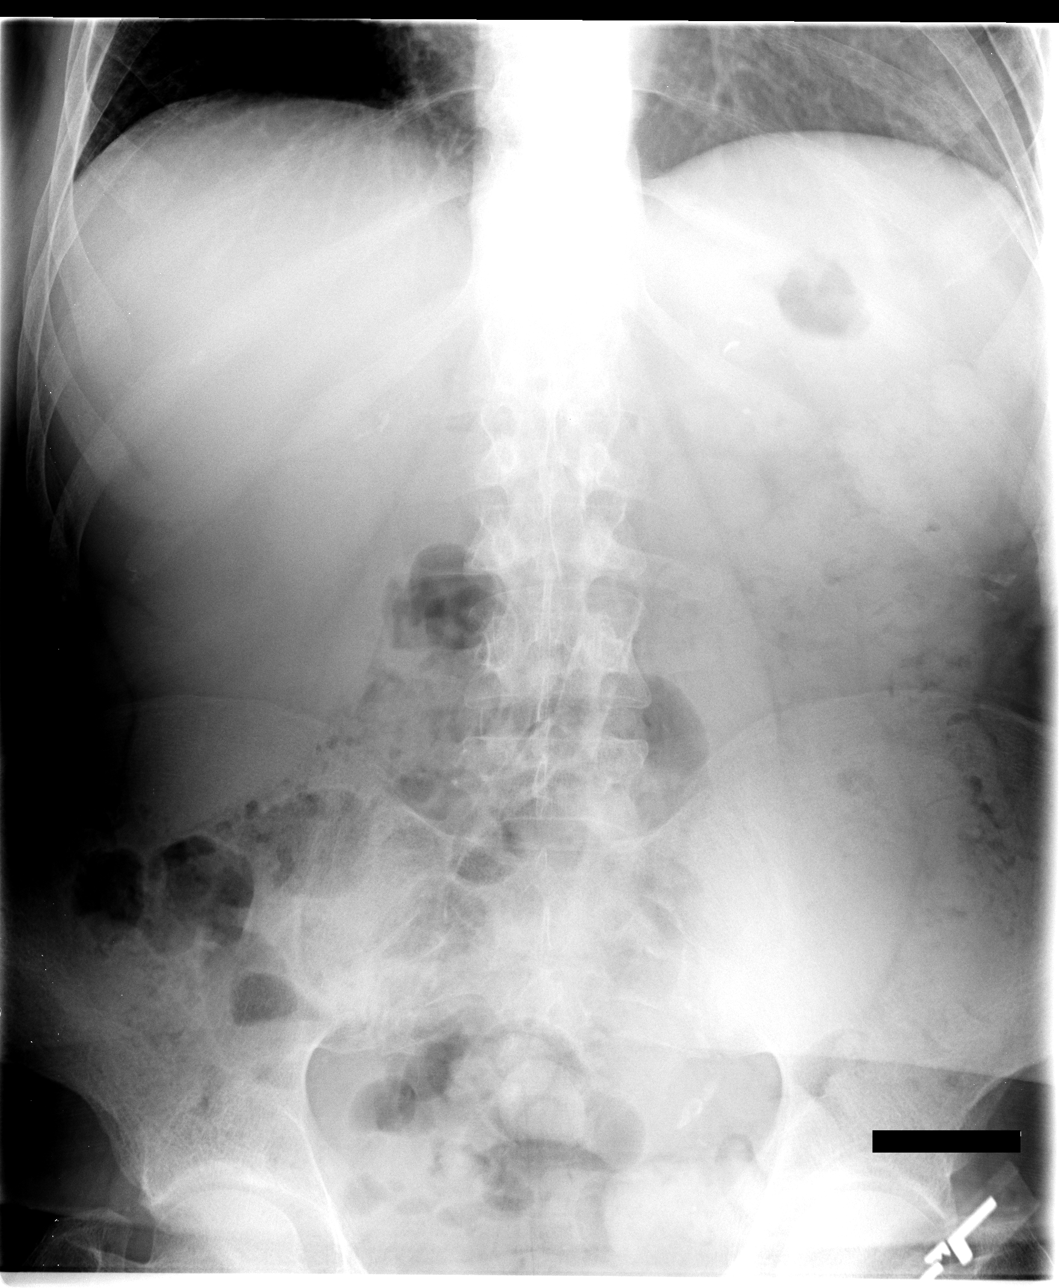

[view not recorded (3 of 4)]
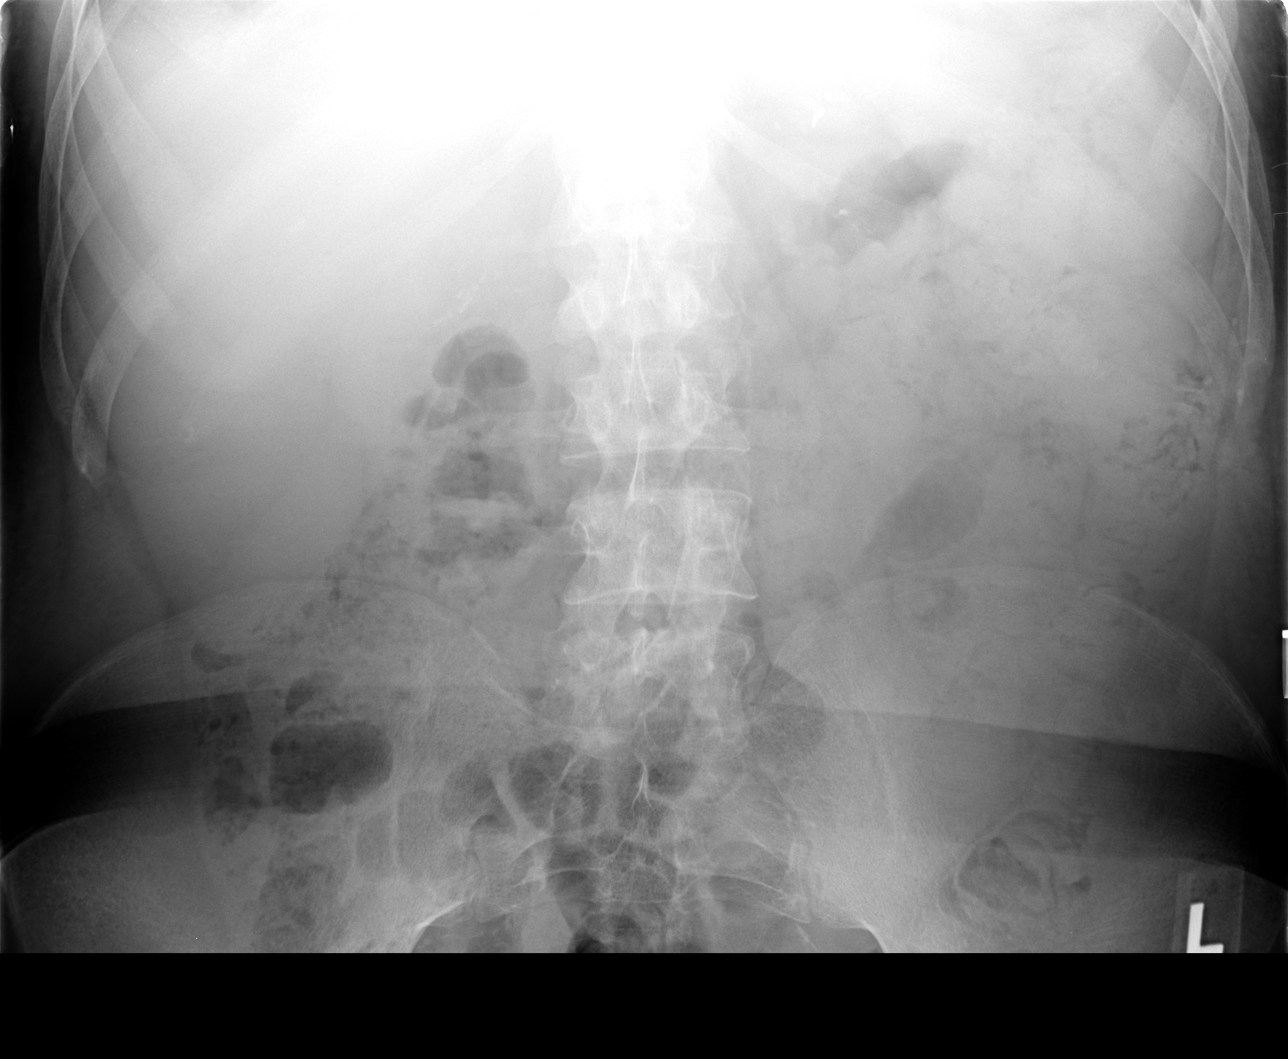

[view not recorded (4 of 4)]
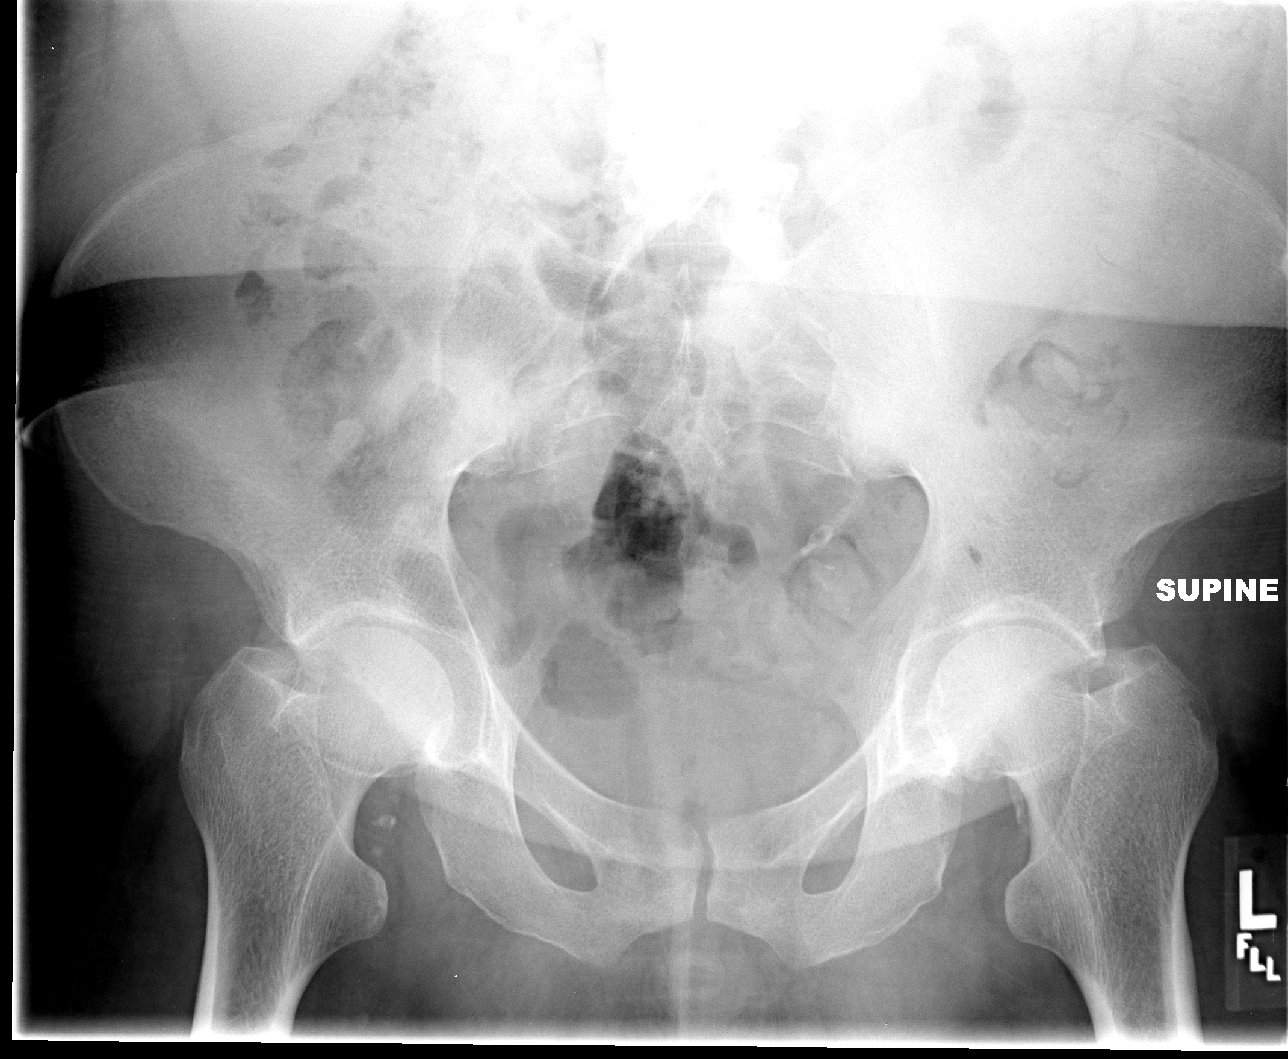

[4 of 4 positions shown; findings below may reference images not displayed]

FINDINGS: Upper limits normal heart size again identified.
There is no evidence of focal airspace disease, pleural effusion or
pneumothorax.

The bowel gas pattern is unremarkable.
There is no evidence of bowel obstruction or pneumoperitoneum.
No acute bony abnormalities are identified.
No suspicious calcifications are noted.
IMPRESSION: No evidence of acute abnormality.

Unremarkable bowel gas pattern.

## 2012-04-10 IMAGING — CR DG FOOT COMPLETE 3+V*L*
3 series · 3 of 3 positions shown · non-contrast
Comparison: Left foot radiographs 08/14/2010

CLINICAL DATA: Foot pain.  Drop weight on foot.

LEFT FOOT - COMPLETE 3+ VIEW

[view not recorded (1 of 3)]
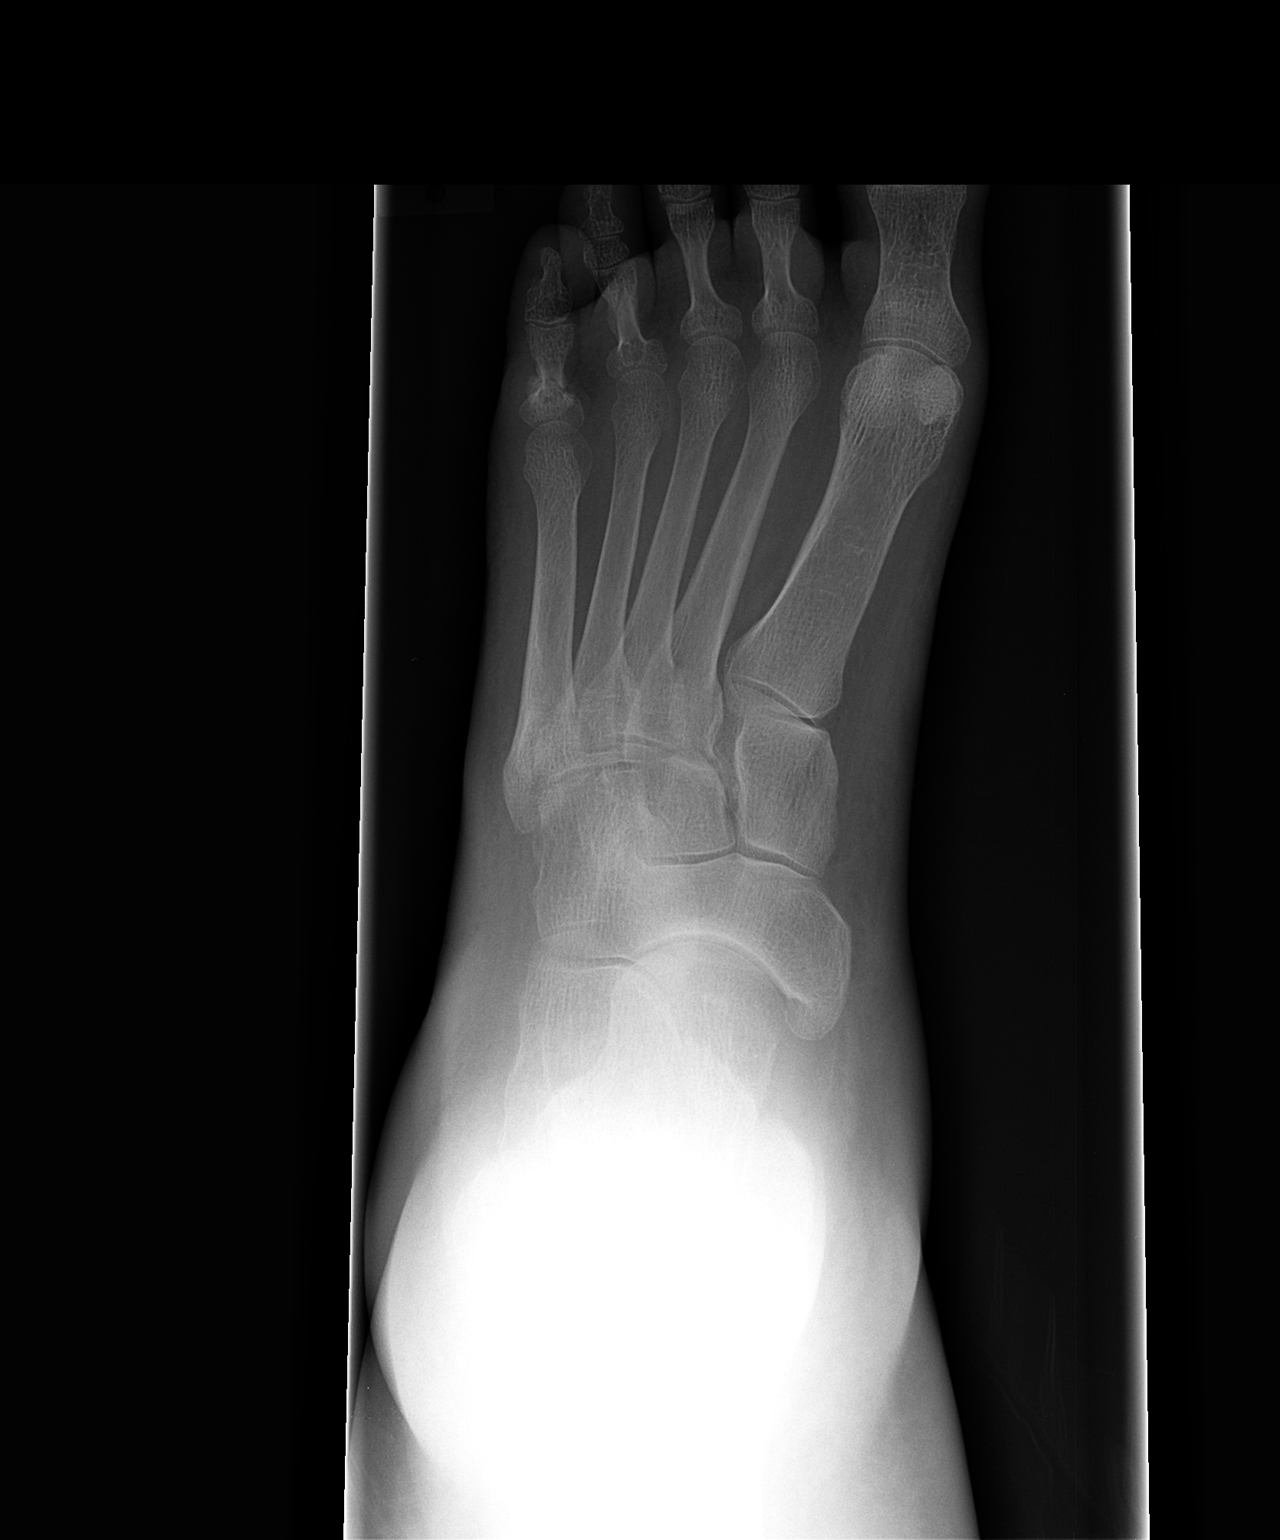

[view not recorded (2 of 3)]
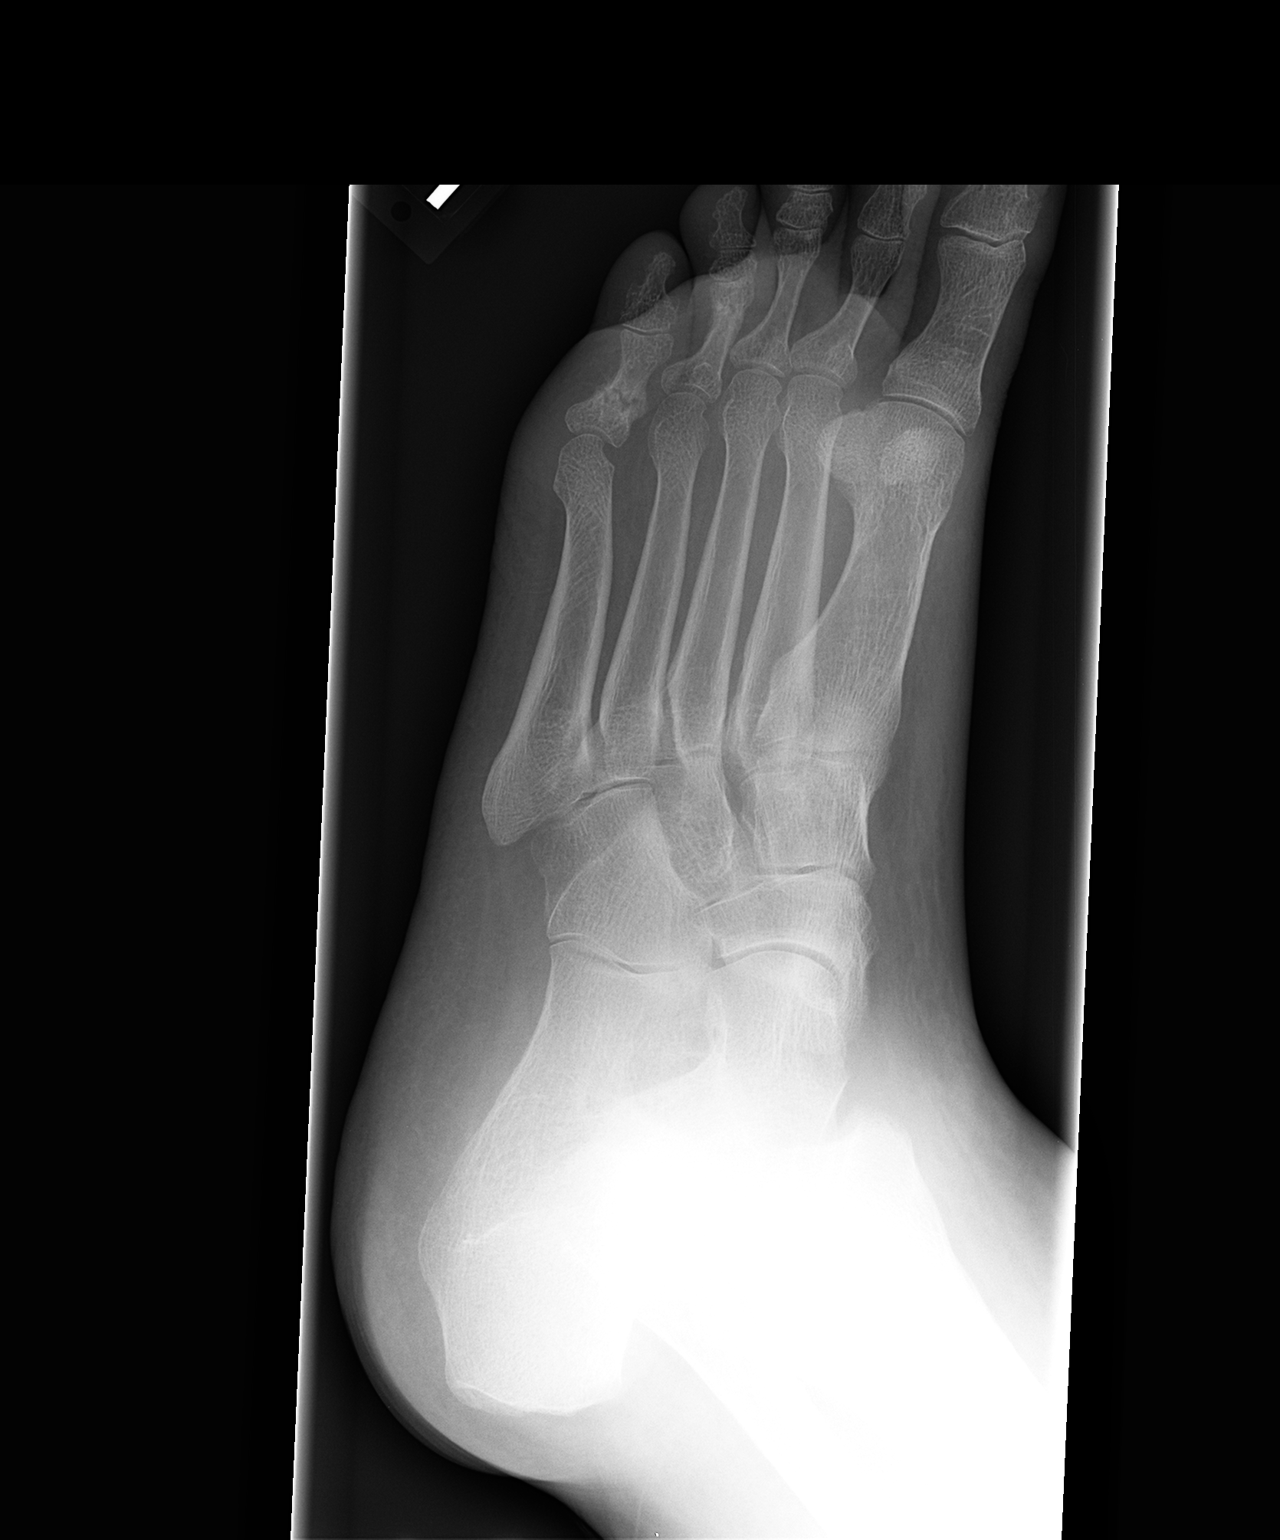

[view not recorded (3 of 3)]
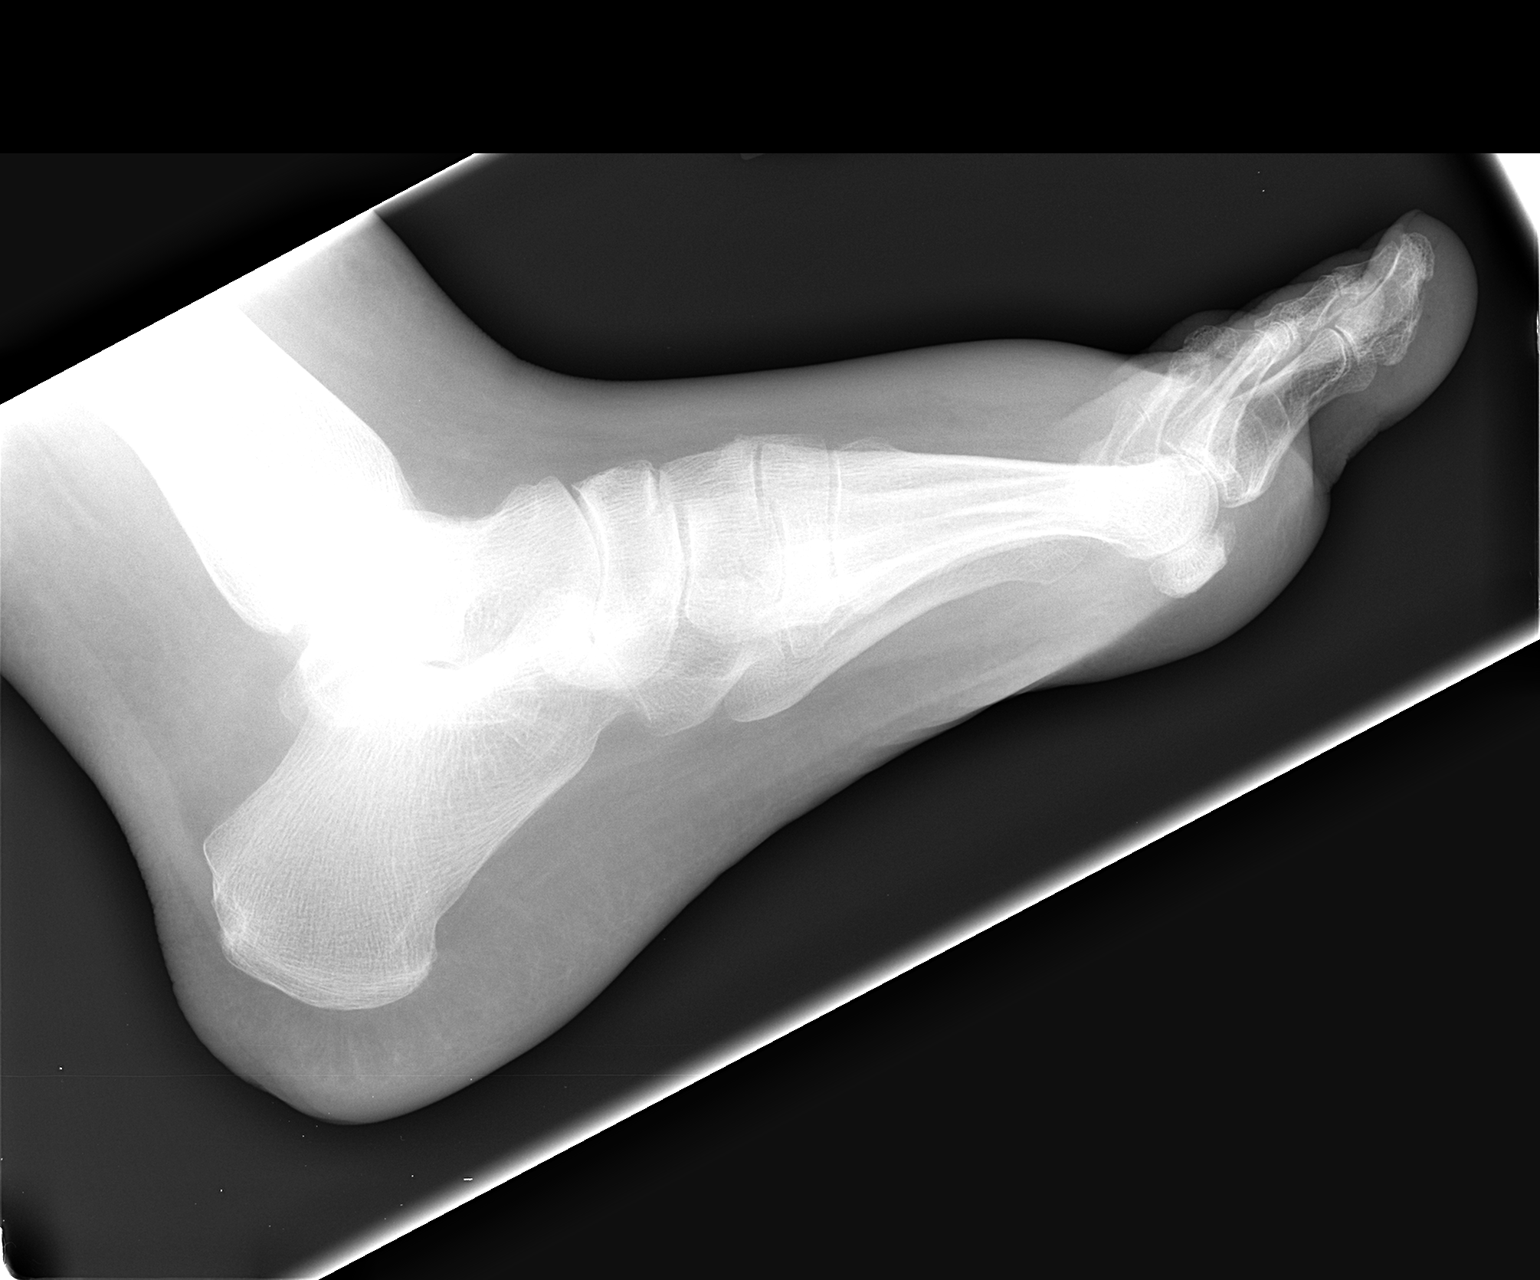

[3 of 3 positions shown; findings below may reference images not displayed]

FINDINGS: There are healing fractures of the fourth and fifth
proximal phalanges.  No acute fractures are seen.  The joint spaces
are maintained.
IMPRESSION: 1.  Healing fourth and fifth proximal phalanx fractures.
2.  No definite acute fractures.

## 2012-04-10 IMAGING — CR DG ANKLE COMPLETE 3+V*L*
3 series · 3 of 3 positions shown · non-contrast
Comparison: None

CLINICAL DATA: Left ankle pain.

LEFT ANKLE COMPLETE - 3+ VIEW

[view not recorded (1 of 3)]
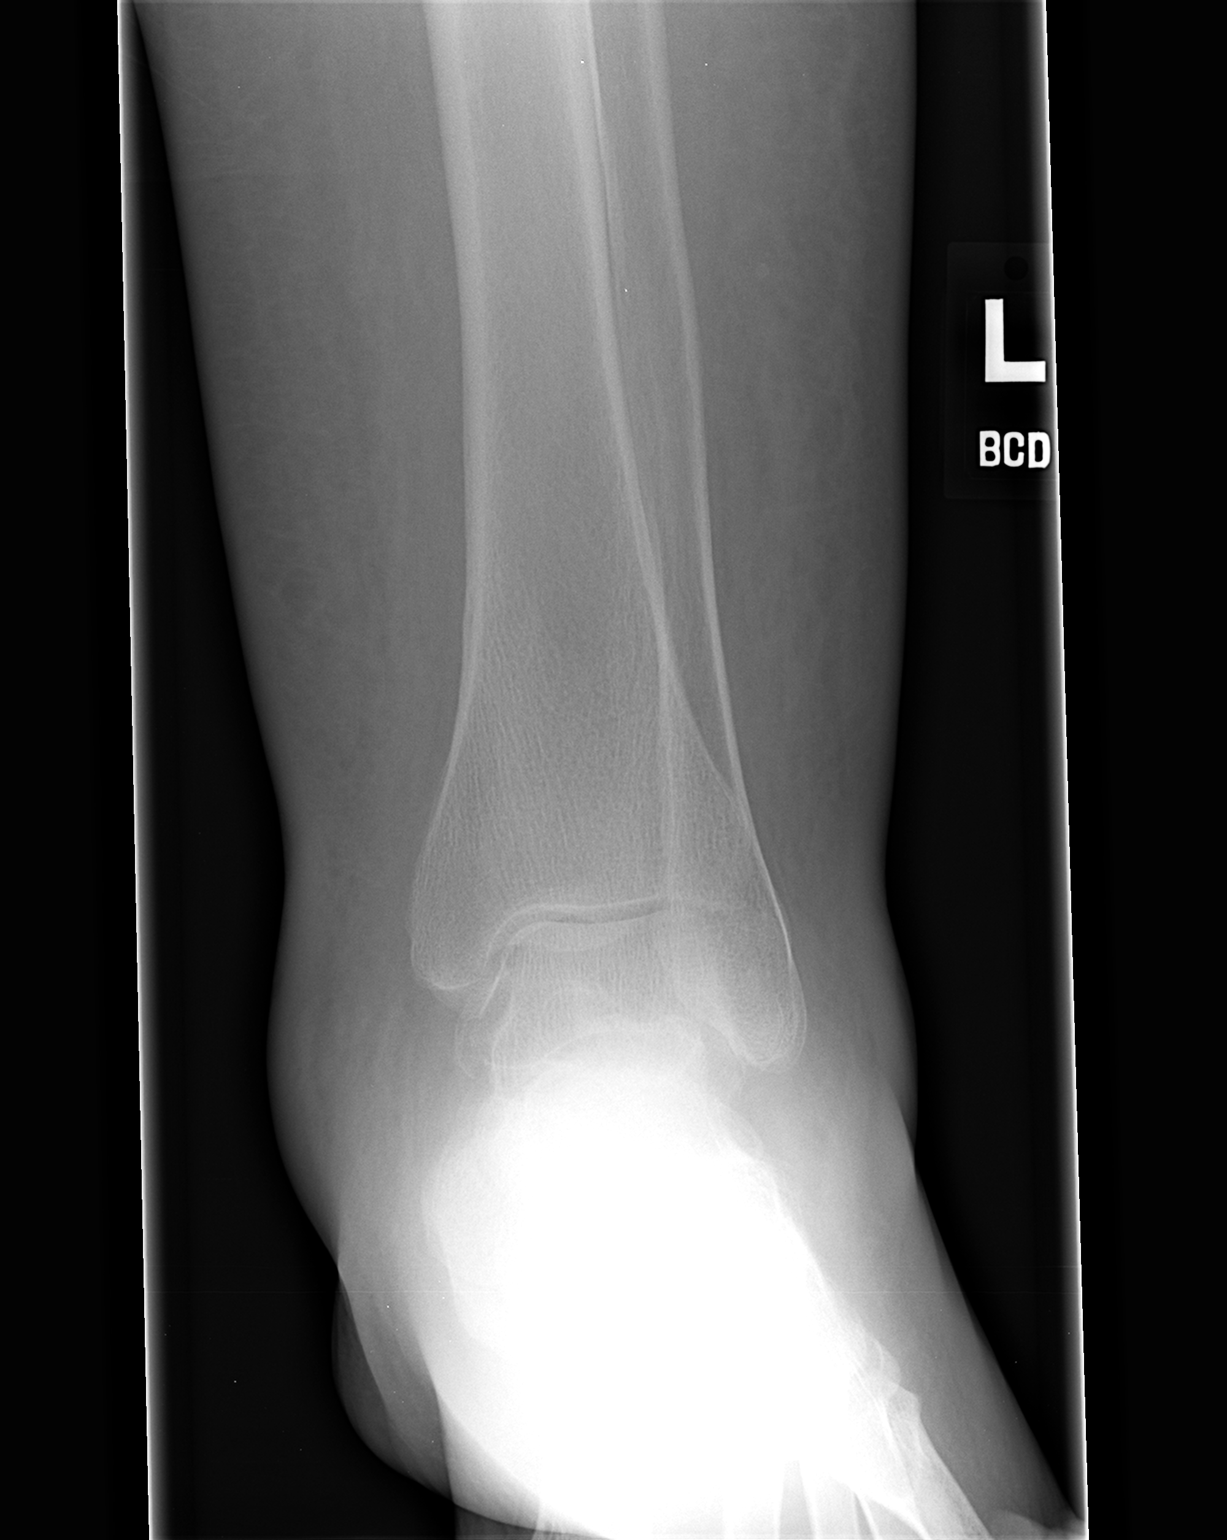

[view not recorded (2 of 3)]
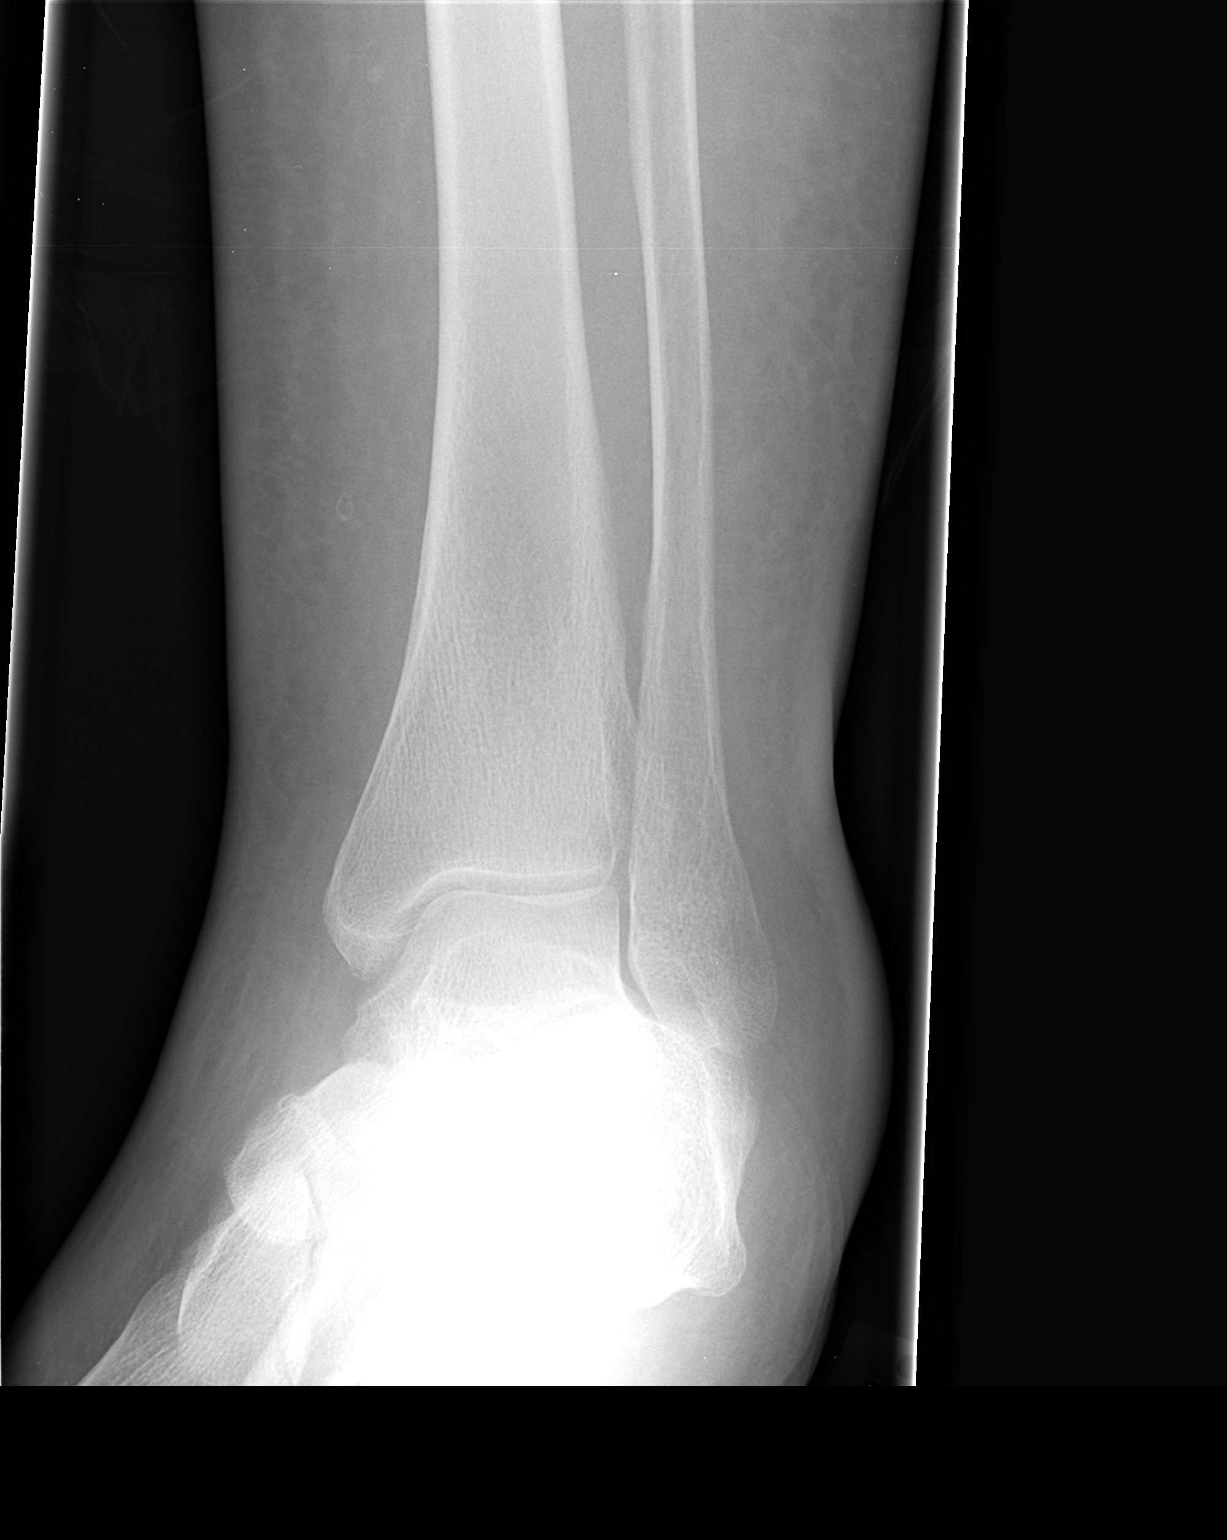

[view not recorded (3 of 3)]
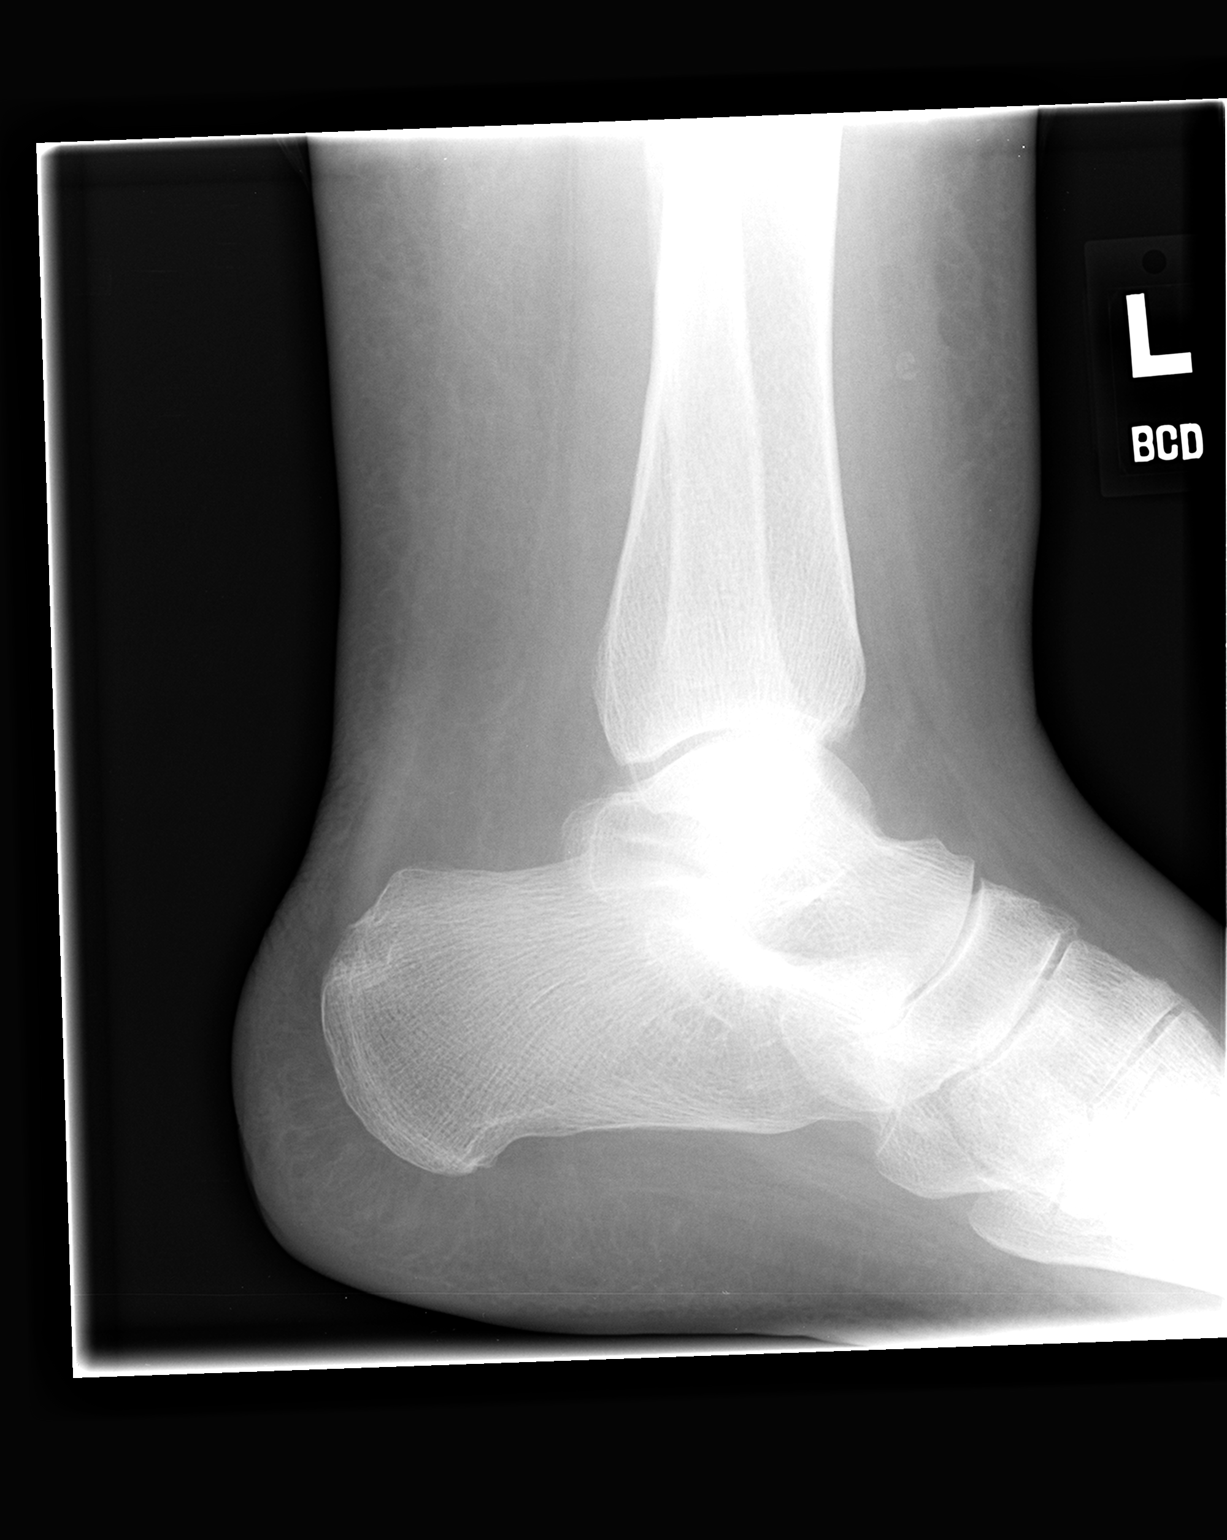

[3 of 3 positions shown; findings below may reference images not displayed]

FINDINGS: The ankle mortise is maintained.  No fractures are seen.
No osteochondral lesions.  Diffuse soft tissue swelling is noted.
IMPRESSION: No acute bony findings.

## 2012-04-15 IMAGING — US US ABDOMEN COMPLETE
1 series · 13 of 25 positions shown · non-contrast
Comparison: Previous examinations.

CLINICAL DATA: Chest and abdomen pain.

COMPLETE ABDOMINAL ULTRASOUND

[Series 1: us abdomen complete · 0.30mm/px · 13 of 110 slices shown]
[im 1/110]
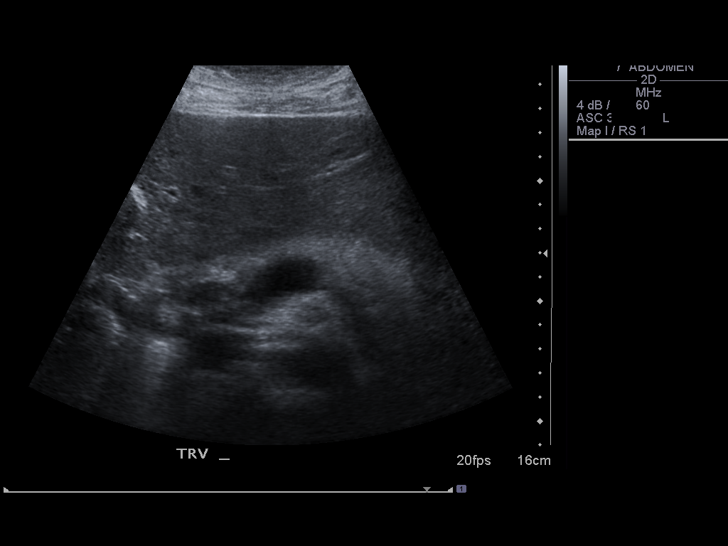
[im 10/110]
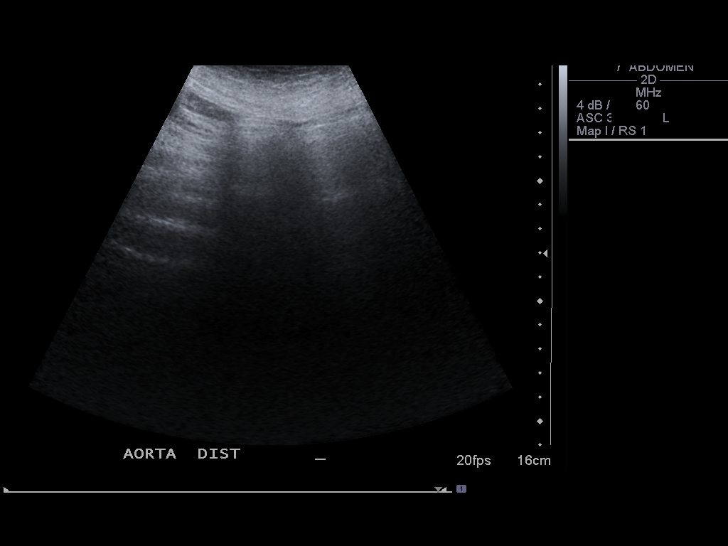
[im 19/110]
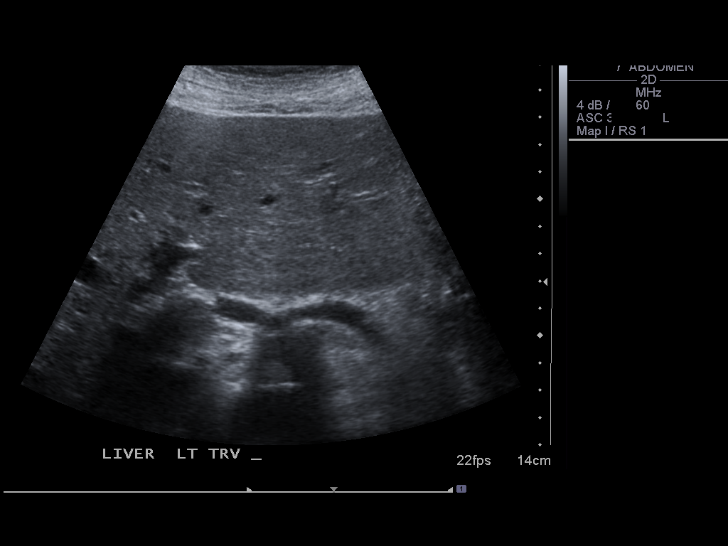
[im 28/110]
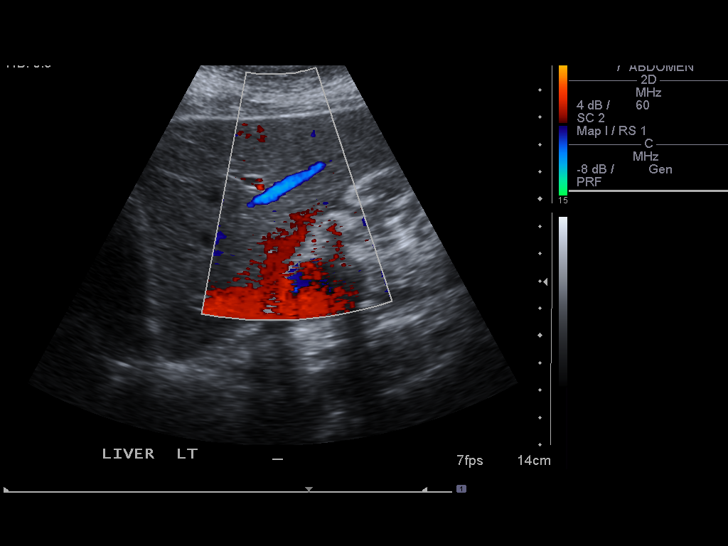
[im 37/110]
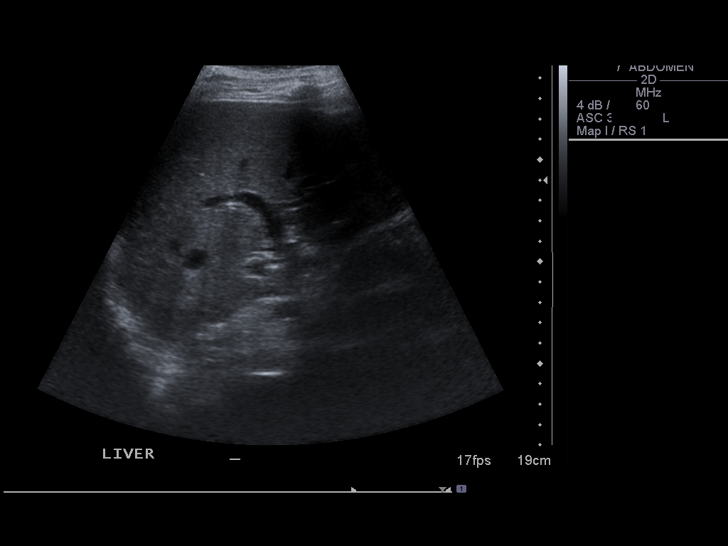
[im 46/110]
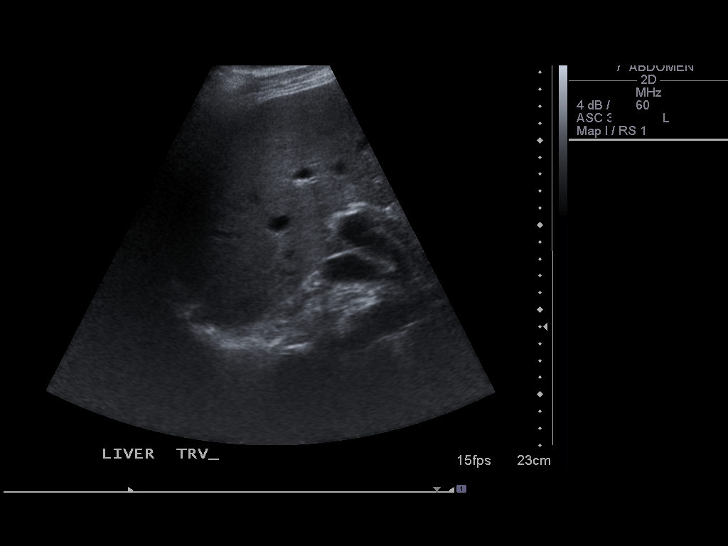
[im 55/110]
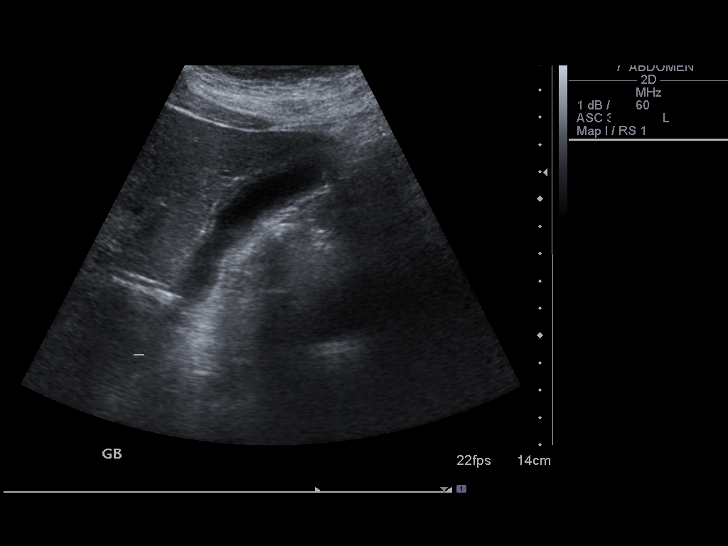
[im 64/110]
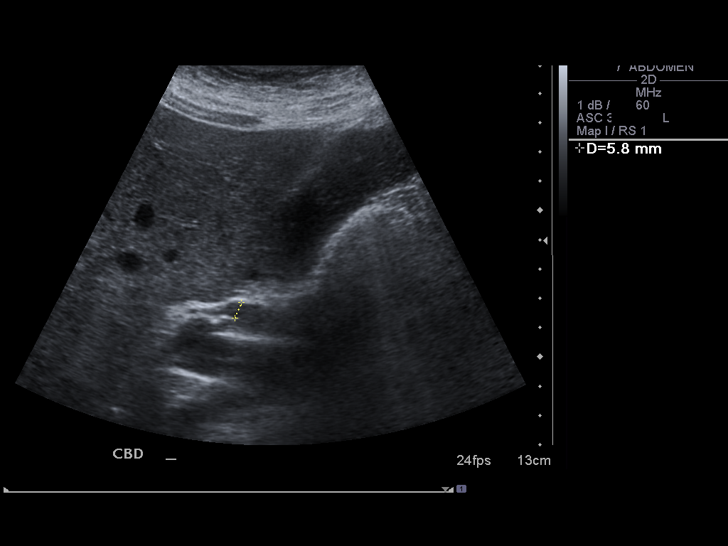
[im 73/110]
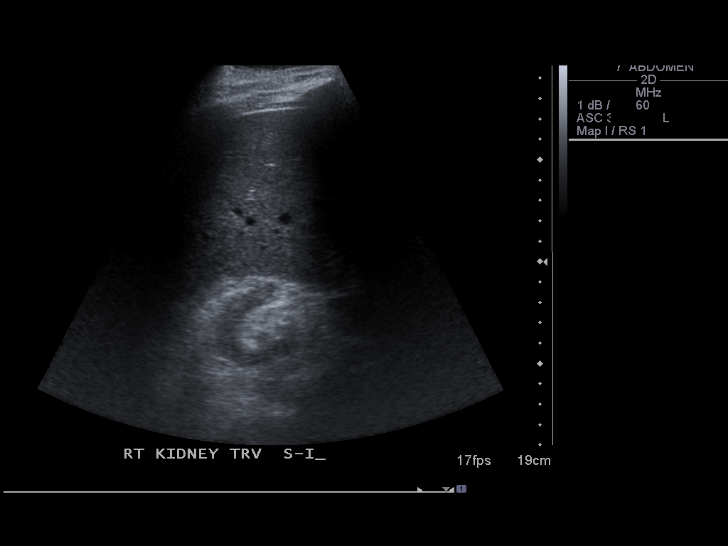
[im 82/110]
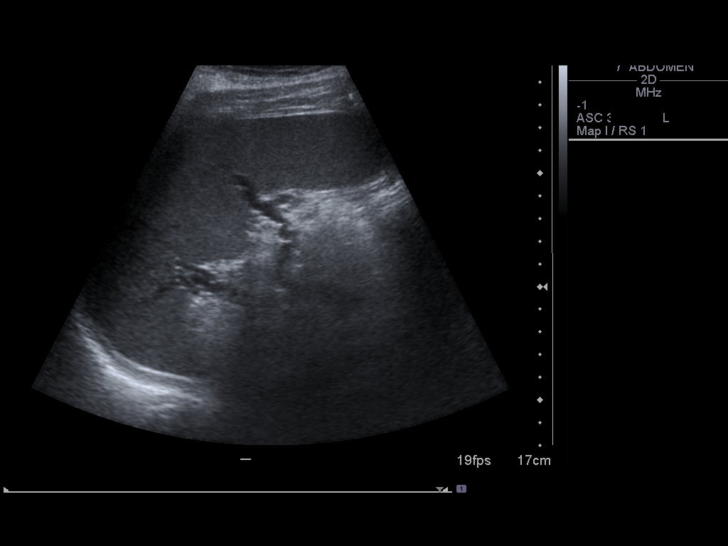
[im 91/110]
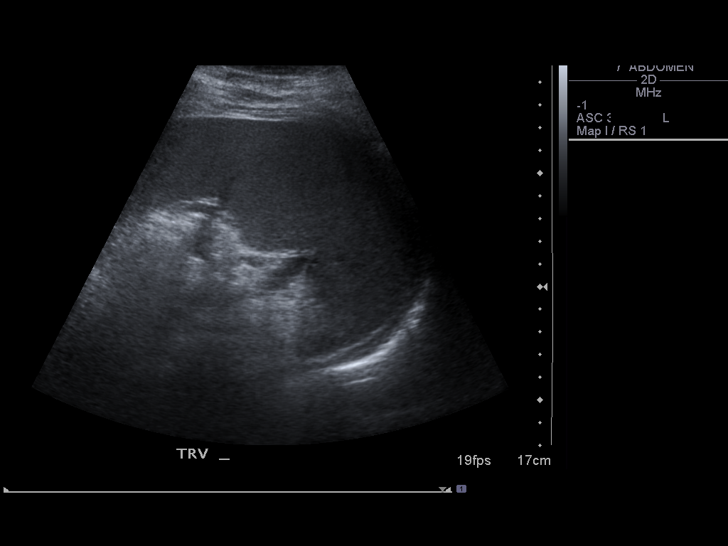
[im 100/110]
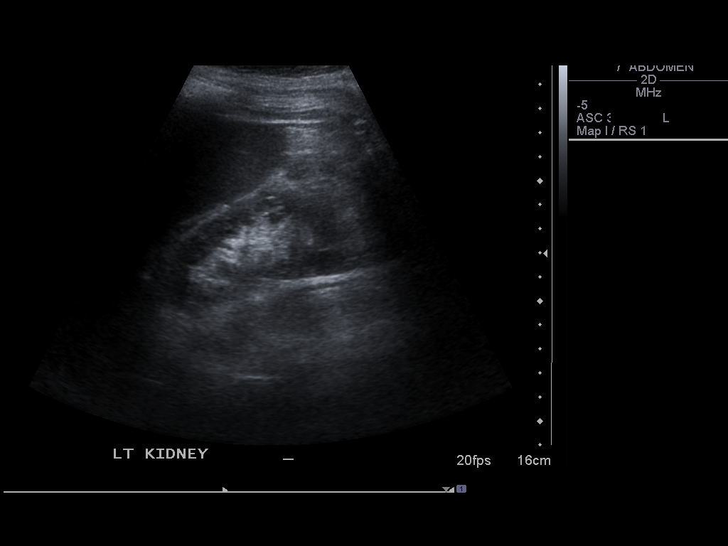
[im 110/110]
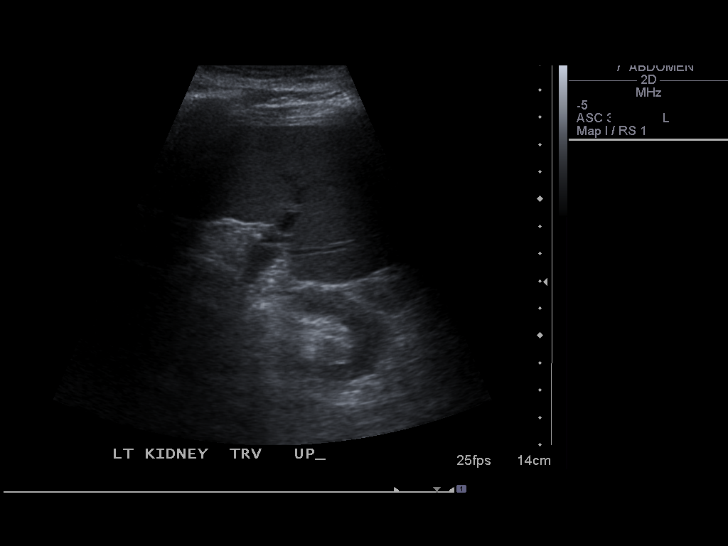

[13 of 25 positions shown; findings below may reference images not displayed]

FINDINGS: Gallbladder:  Poorly distended with diffuse wall thickening.  The
maximum wall thickness is 3.1 mm.  No gallstones or pericholecystic
fluid.  No tenderness over the gallbladder.

Common bile duct:  Prominent for the patient's age, measuring up to
8.3 mm in maximum diameter.  No visualized common duct stones.

Liver:  Diffusely enlarged.

IVC:  Appears normal.

Pancreas:  Normal

Spleen:  Enlarged, measuring 12.0 cm in length and with a
calculated volume of 592.5 ml.

Right Kidney:  Diffuse cortical thinning and prominent renal sinus
fat, measuring 9.3 cm in length.  No hydronephrosis.

Left Kidney:  Diffuse cortical thinning and prominent renal sinus
fat, measuring 7.9 cm in length.  No hydronephrosis.Multiple
subcentimeter cysts.

Abdominal aorta:  Obscured by overlying bowel gas distally.  No
visible aneurysm.

No free peritoneal fluid seen.
IMPRESSION: 1.  Poorly distended gallbladder with diffuse wall thickening.
2.  Stable borderline hepatosplenomegaly.
3.  Stable prominent common duct with no visible common duct stones
and no intrahepatic ductal dilatation.
4.  Stable bilateral renal atrophy.

## 2012-04-15 IMAGING — CR DG CHEST 1V
1 series · 1 of 1 positions shown · non-contrast
Comparison: 09/20/2010.

CLINICAL DATA: Smoker with chest pain and cough for the past month.

CHEST - 1 VIEW

[view not recorded]
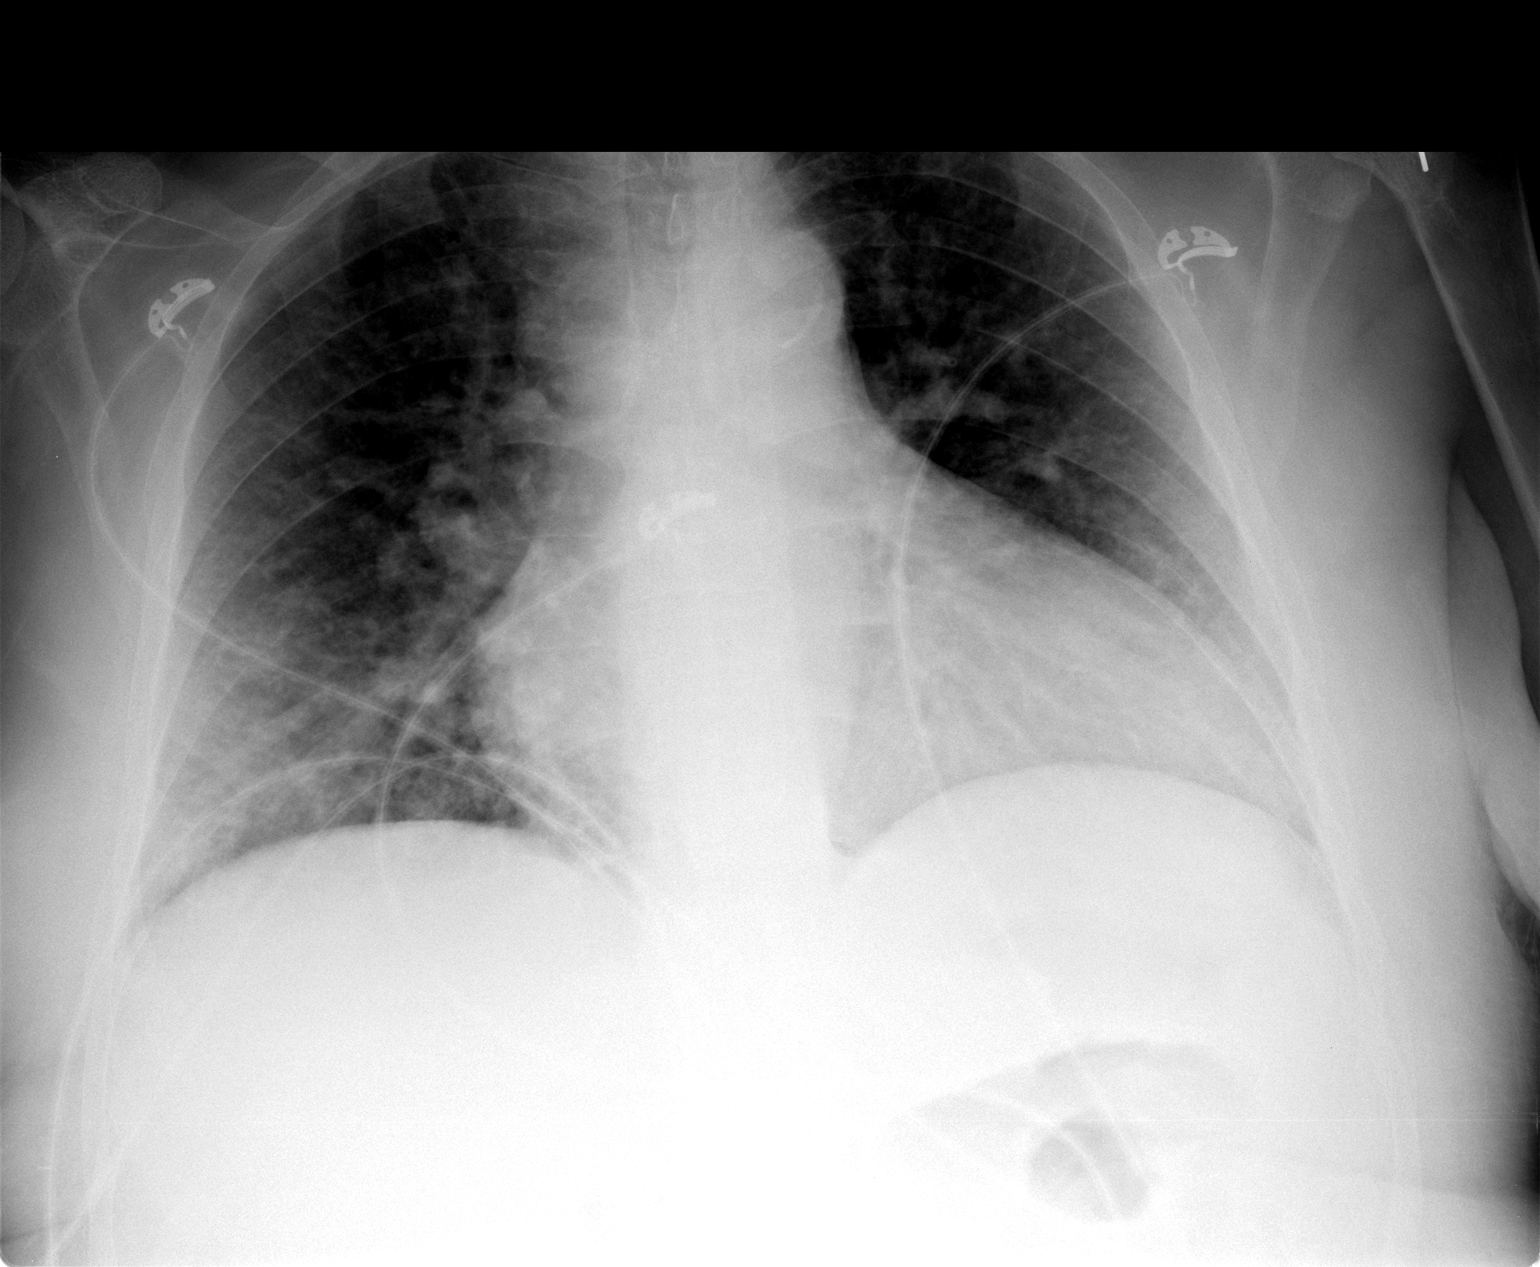

[1 of 1 positions shown; findings below may reference images not displayed]

FINDINGS: Stable enlarged cardiac silhouette.  Clear lungs.
Increased prominence of the pulmonary vasculature, accentuated by a
decreased inspiration.  No gross change in prominence of the
interstitial markings.  Diffuse osteopenia.
IMPRESSION: 1.  Stable cardiomegaly with increased pulmonary vascular
congestion.
2.  No gross  change in chronic interstitial lung disease with no
definite superimposed pulmonary edema.

## 2012-04-18 IMAGING — CT CT ANGIO CHEST
2 of 6 series · 6 of 36 positions shown · IV contrast (Omnipaque 300)
Comparison: Chest x-ray 10/04/2010.  Chest CT 02/18/2010

CLINICAL DATA: Chest pain.

CT ANGIOGRAPHY CHEST WITH CONTRAST
TECHNIQUE: Multidetector CT imaging of the chest was performed
using the standard protocol during bolus administration of
intravenous contrast.  Multiplanar CT image reconstructions
including MIPs were obtained to evaluate the vascular anatomy.
Contrast:    100 ml Omnipaque 300 IV.

[Series 5: pe 3.0 b40f · axial · 0.70mm/px · z∈[-308,-116]mm · 5 of 96 slices shown]
[im 16/96  lung]
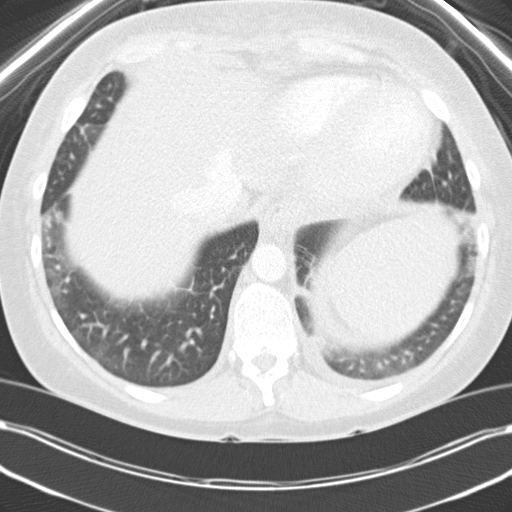
[im 32/96  mediastinal]
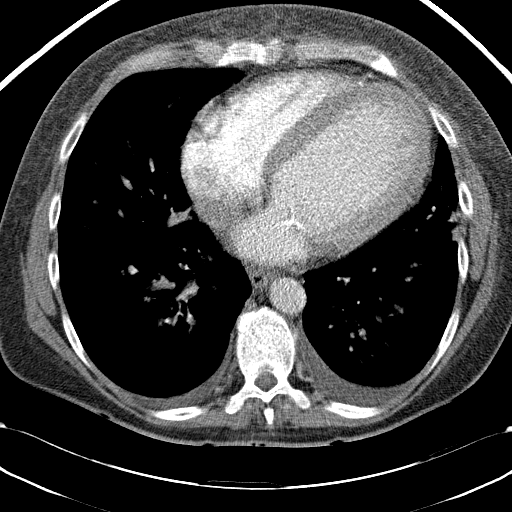
[im 48/96  lung]
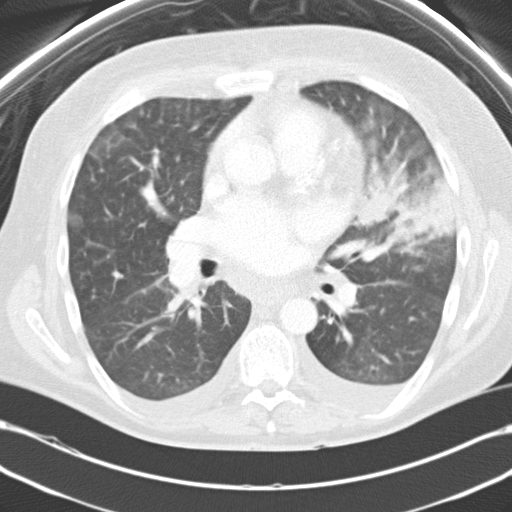
[im 64/96  mediastinal]
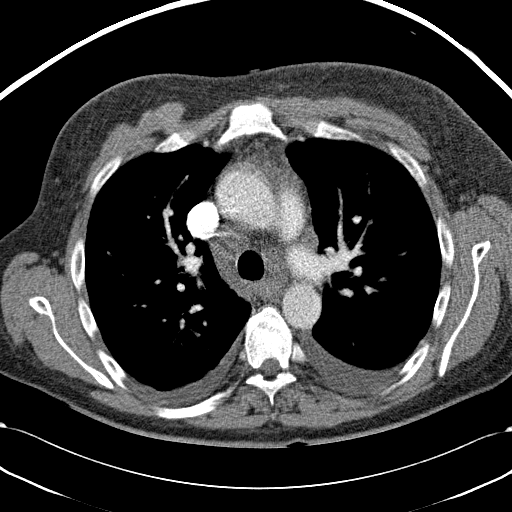
[im 80/96  lung]
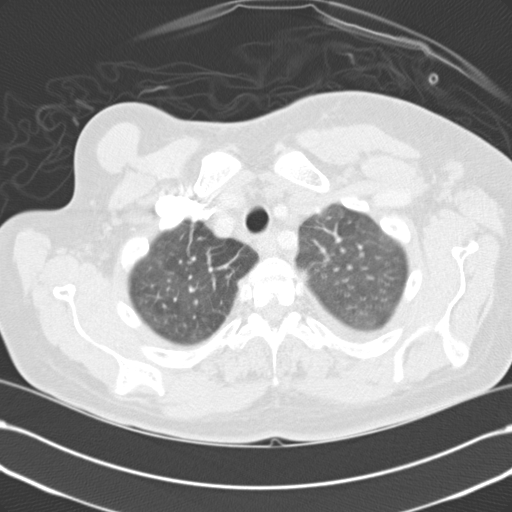

[Series 7: mpr coronal pe 3mm · coronal · 0.58mm/px · 1 of 101 slices shown]
[im 51/101  mediastinal]
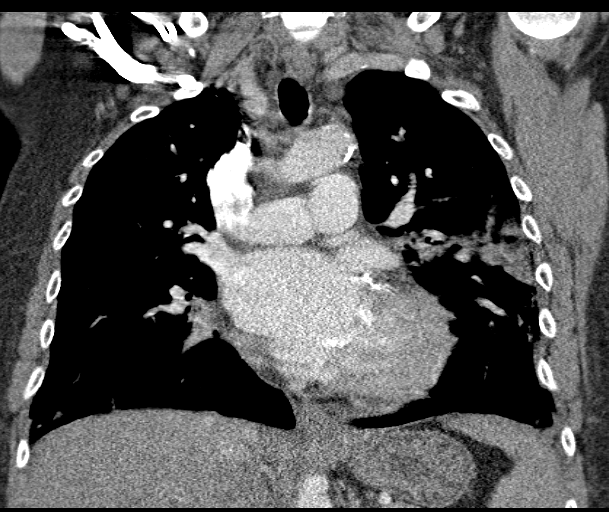

[6 of 36 positions shown; findings below may reference images not displayed]

FINDINGS: No filling defects are seen in the pulmonary arteries to
suggest pulmonary emboli.  There are patchy airspace opacities seen
bilaterally, most pronounced in the left upper lobe/lingula as well
as the right middle lobe and both lower lobes of the lung bases
compatible with multifocal pneumonia.  There are small bilateral
pleural effusions.

Heart is enlarged.  Aorta is normal caliber.  Coronary arteries are
heavily calcified. Visualized thyroid and chest wall soft tissues
unremarkable. Imaging into the upper abdomen shows no acute
findings.

Review of the MIP images confirms the above findings.
IMPRESSION: No evidence of pulmonary embolus.

Patchy bilateral opacities, most pronounced in the lingula
compatible with multifocal pneumonia.

Small bilateral pleural effusions.

Severe coronary artery calcifications.

## 2012-04-18 IMAGING — CR DG CHEST 1V PORT SAME DAY
1 series · 1 of 1 positions shown · non-contrast
Comparison: CT earlier today

CLINICAL DATA: Shortness of breath

PORTABLE CHEST - 1 VIEW SAME DAY

[view not recorded]
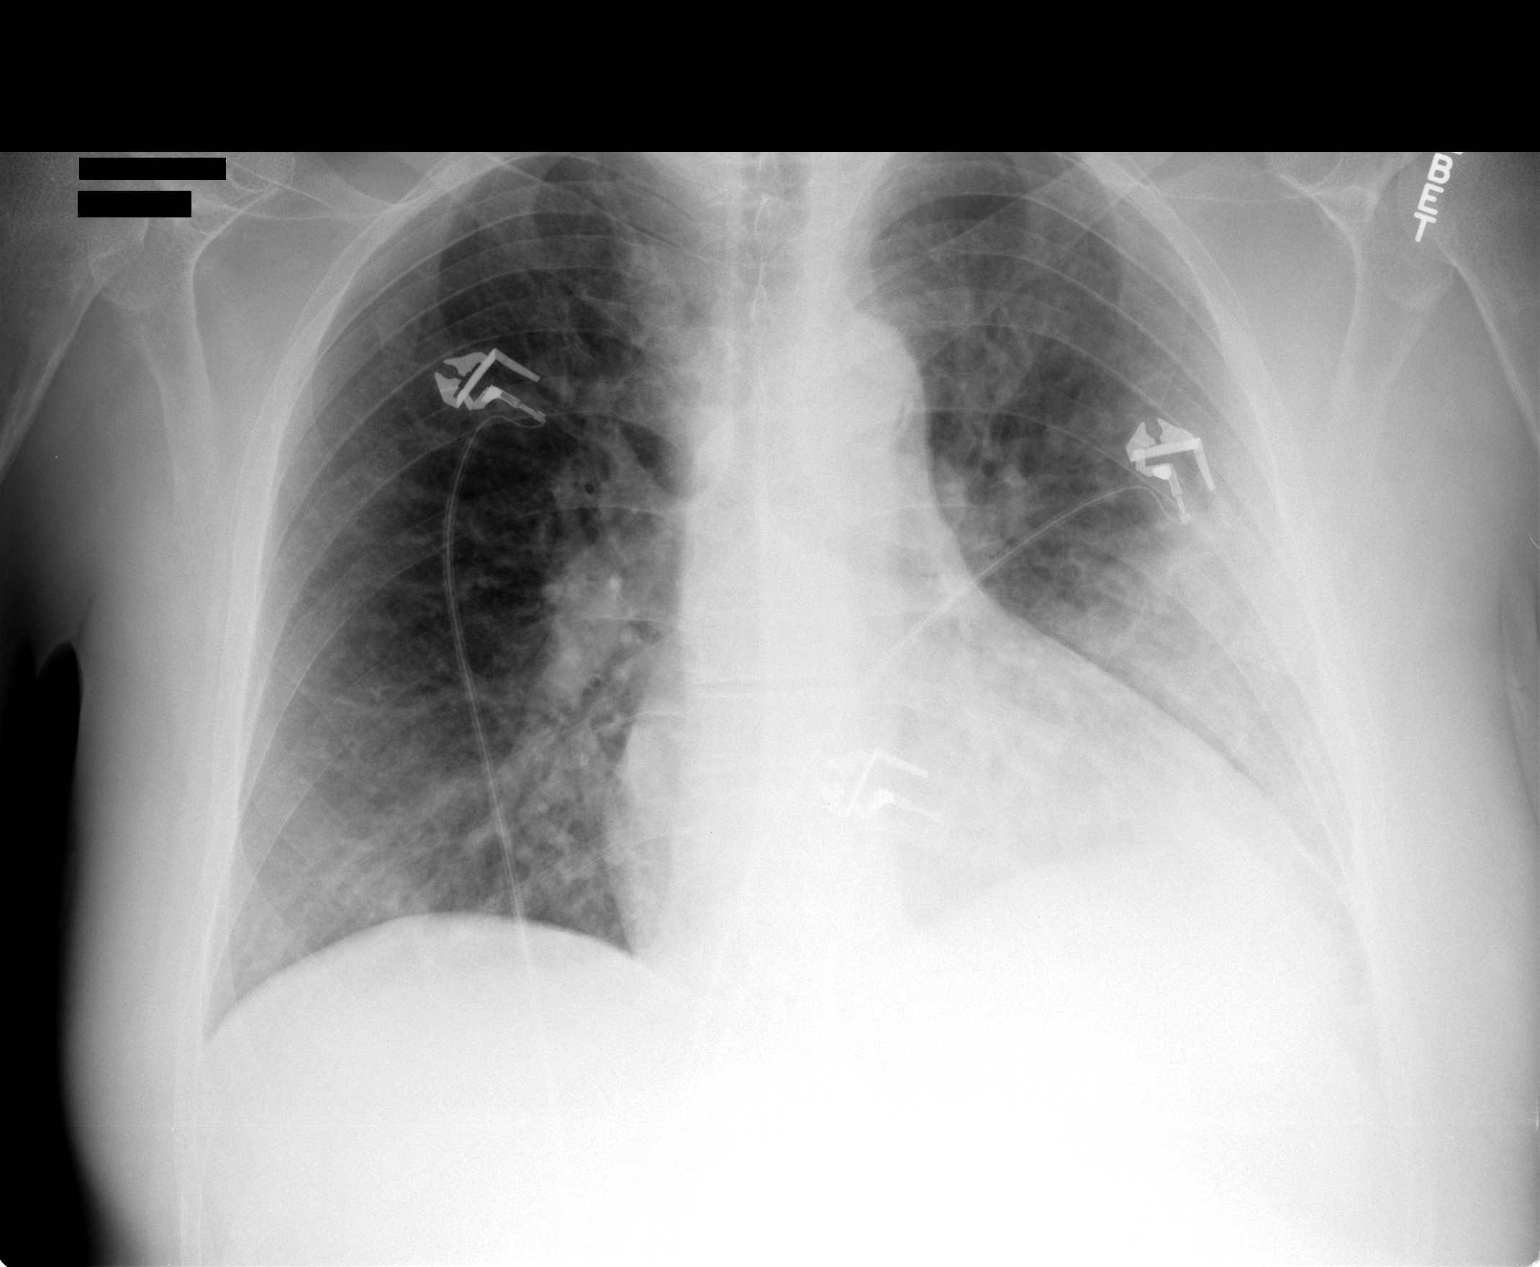

[1 of 1 positions shown; findings below may reference images not displayed]

FINDINGS: Bilateral pulmonary opacities, worse on the left,
consistent with pneumonia.  Small left effusion.  Cardiomegaly.  No
pneumothorax.  Bones unremarkable.  Similar changes to prior CT.
IMPRESSION: Bilateral pneumonia, worse on the left.

## 2012-04-18 IMAGING — CR DG CHEST 1V
1 series · 1 of 1 positions shown · non-contrast
Comparison: 10/01/2010

CLINICAL DATA: Chest pain/fever

CHEST - 1 VIEW

[view not recorded]
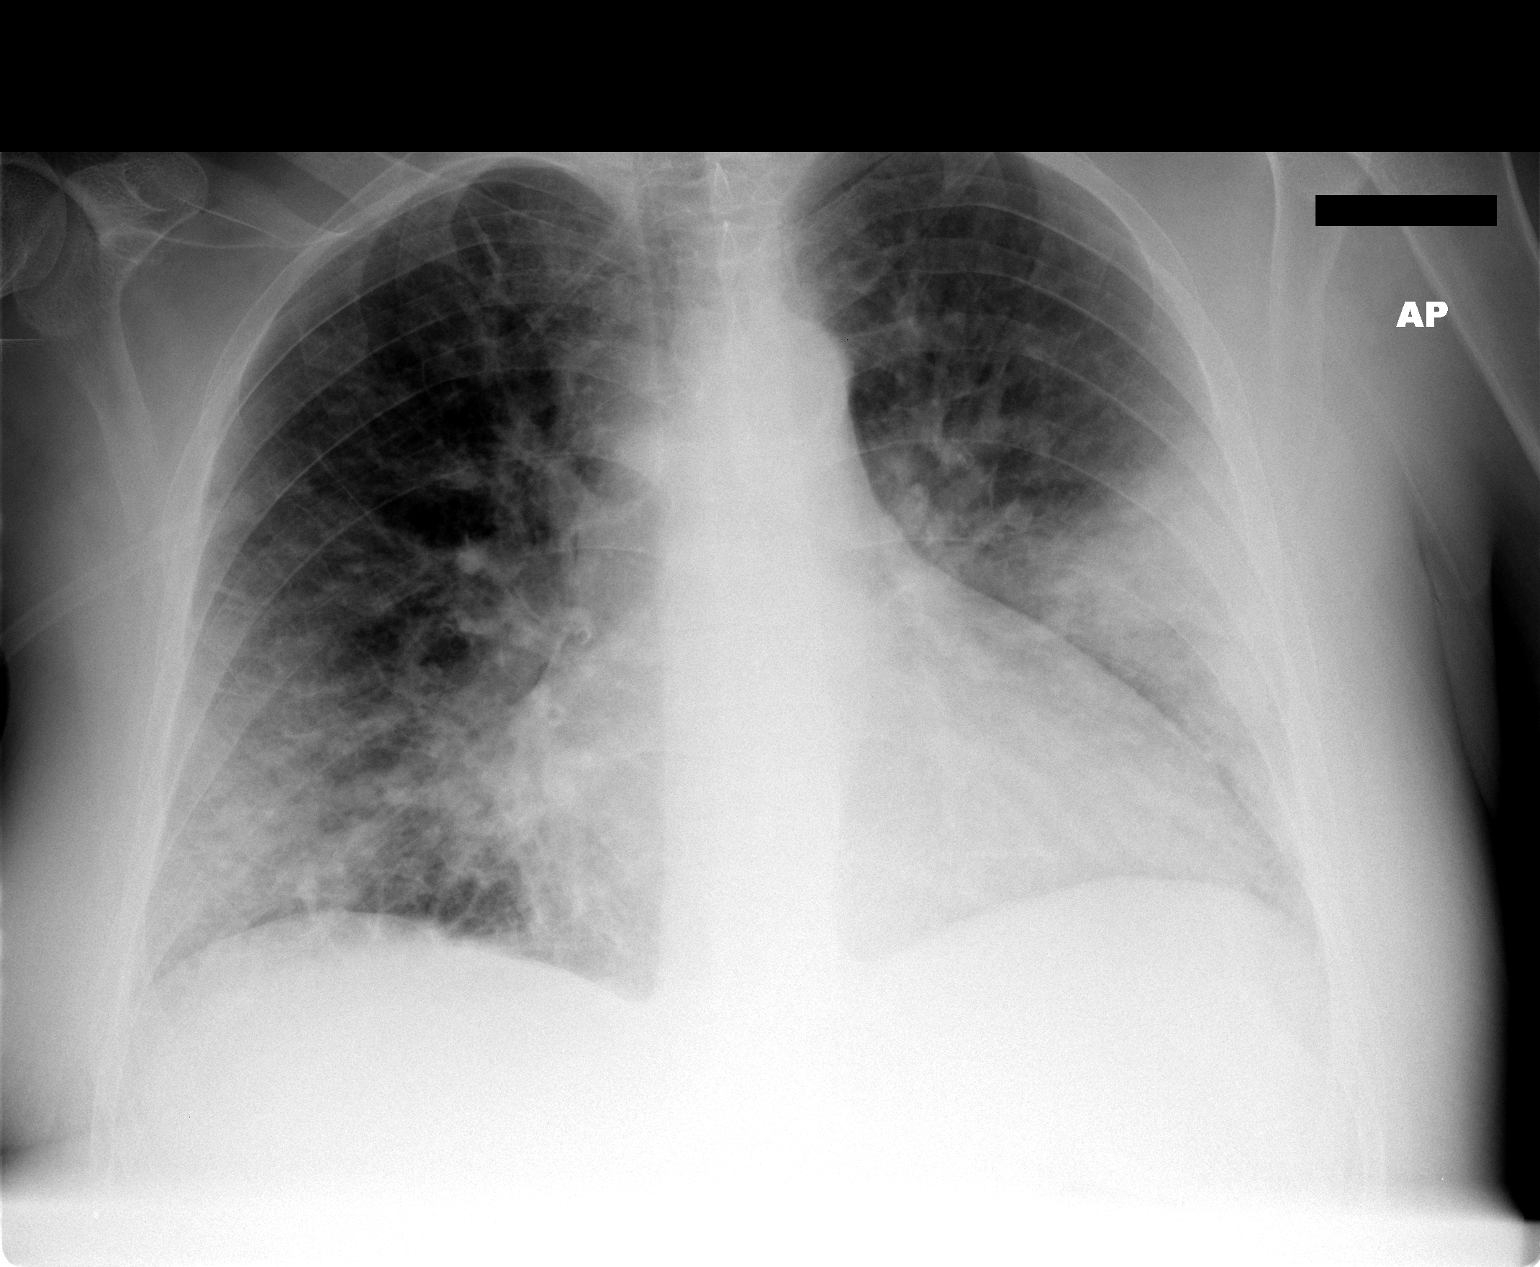

[1 of 1 positions shown; findings below may reference images not displayed]

FINDINGS: There is a focal wedge shaped airspace density in the
axillary region of the left lung consistent with acute pneumonia or
possibly pulmonary infarct.  There also is a probable component of
mild congestive heart failure.  No significant pleural fluid.
IMPRESSION: 1.  Left mid lung consolidation - probable pneumonia.  Consider
pulmonary infarct.
2.  Probable mild congestive heart failure.

## 2012-04-19 IMAGING — CR DG CHEST 2V
2 series · 2 of 2 positions shown · non-contrast
Comparison: 10/04/2010.

CLINICAL DATA: Shortness of breath.  End-stage renal disease.
History of pneumonia.

CHEST - 2 VIEW

[view not recorded (1 of 2)]
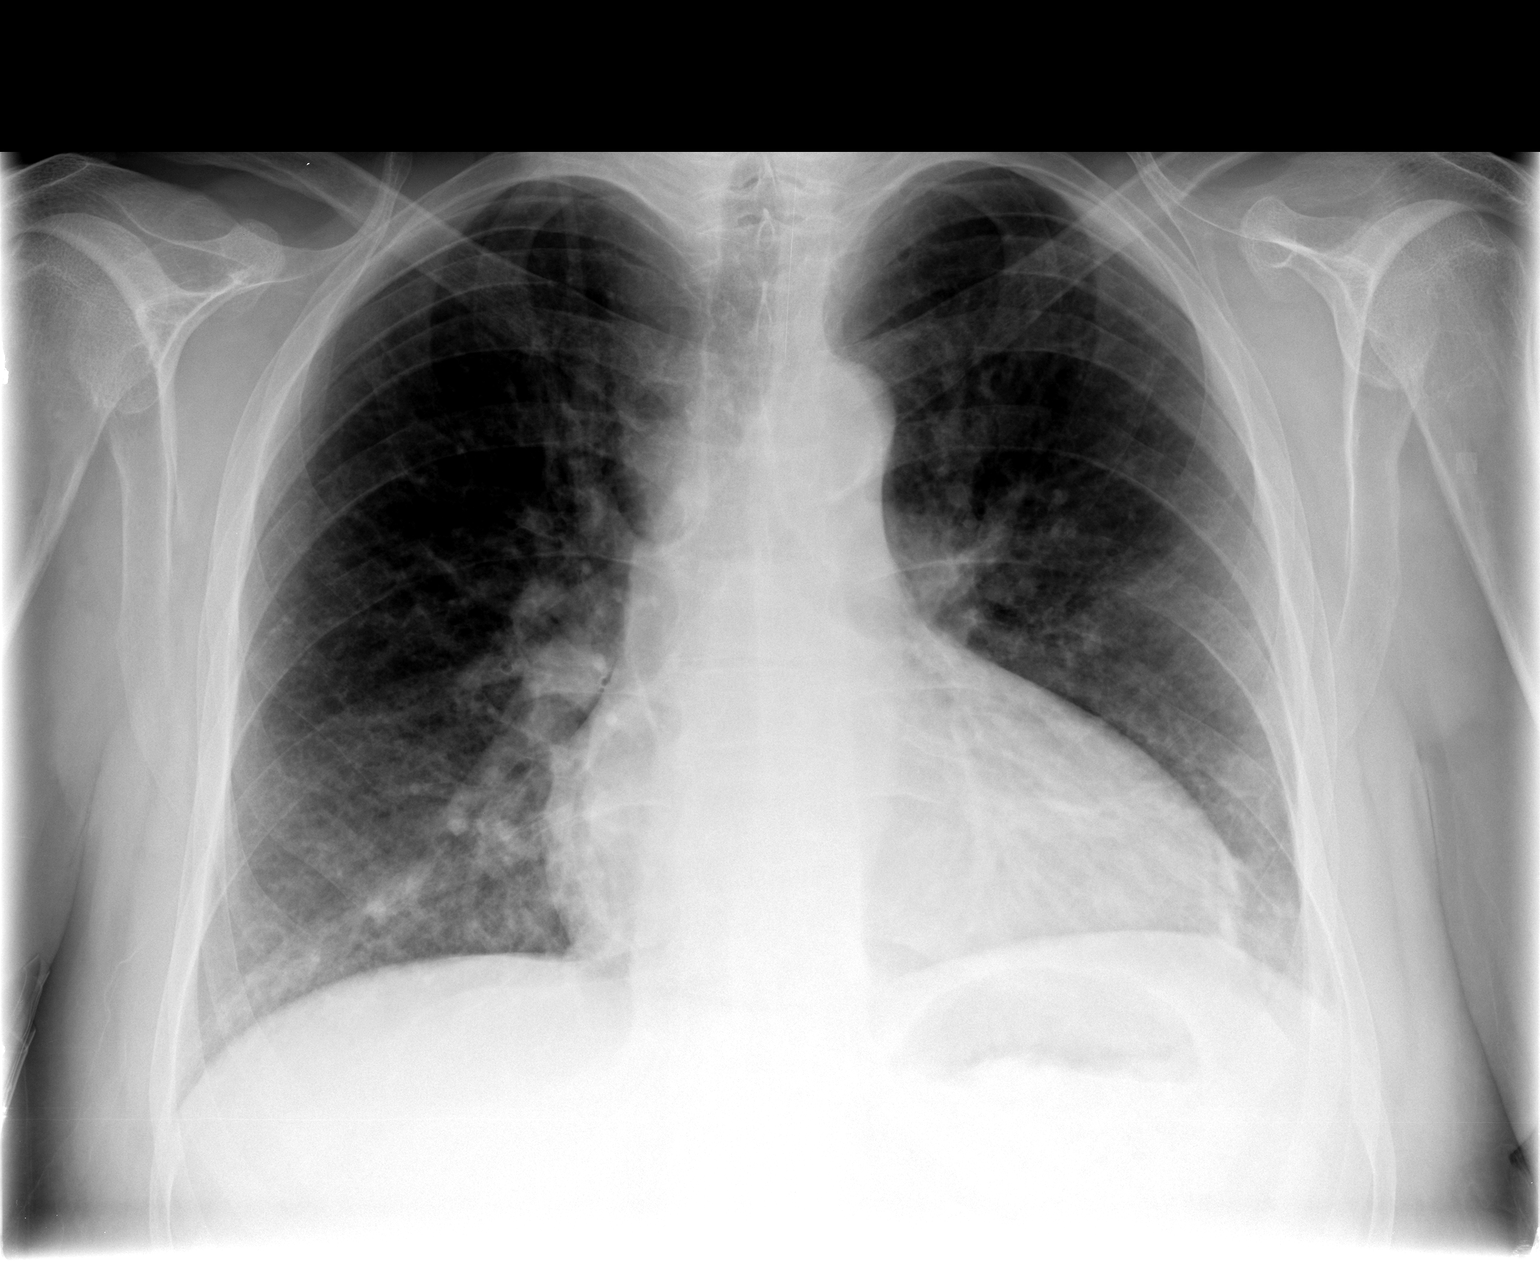

[view not recorded (2 of 2)]
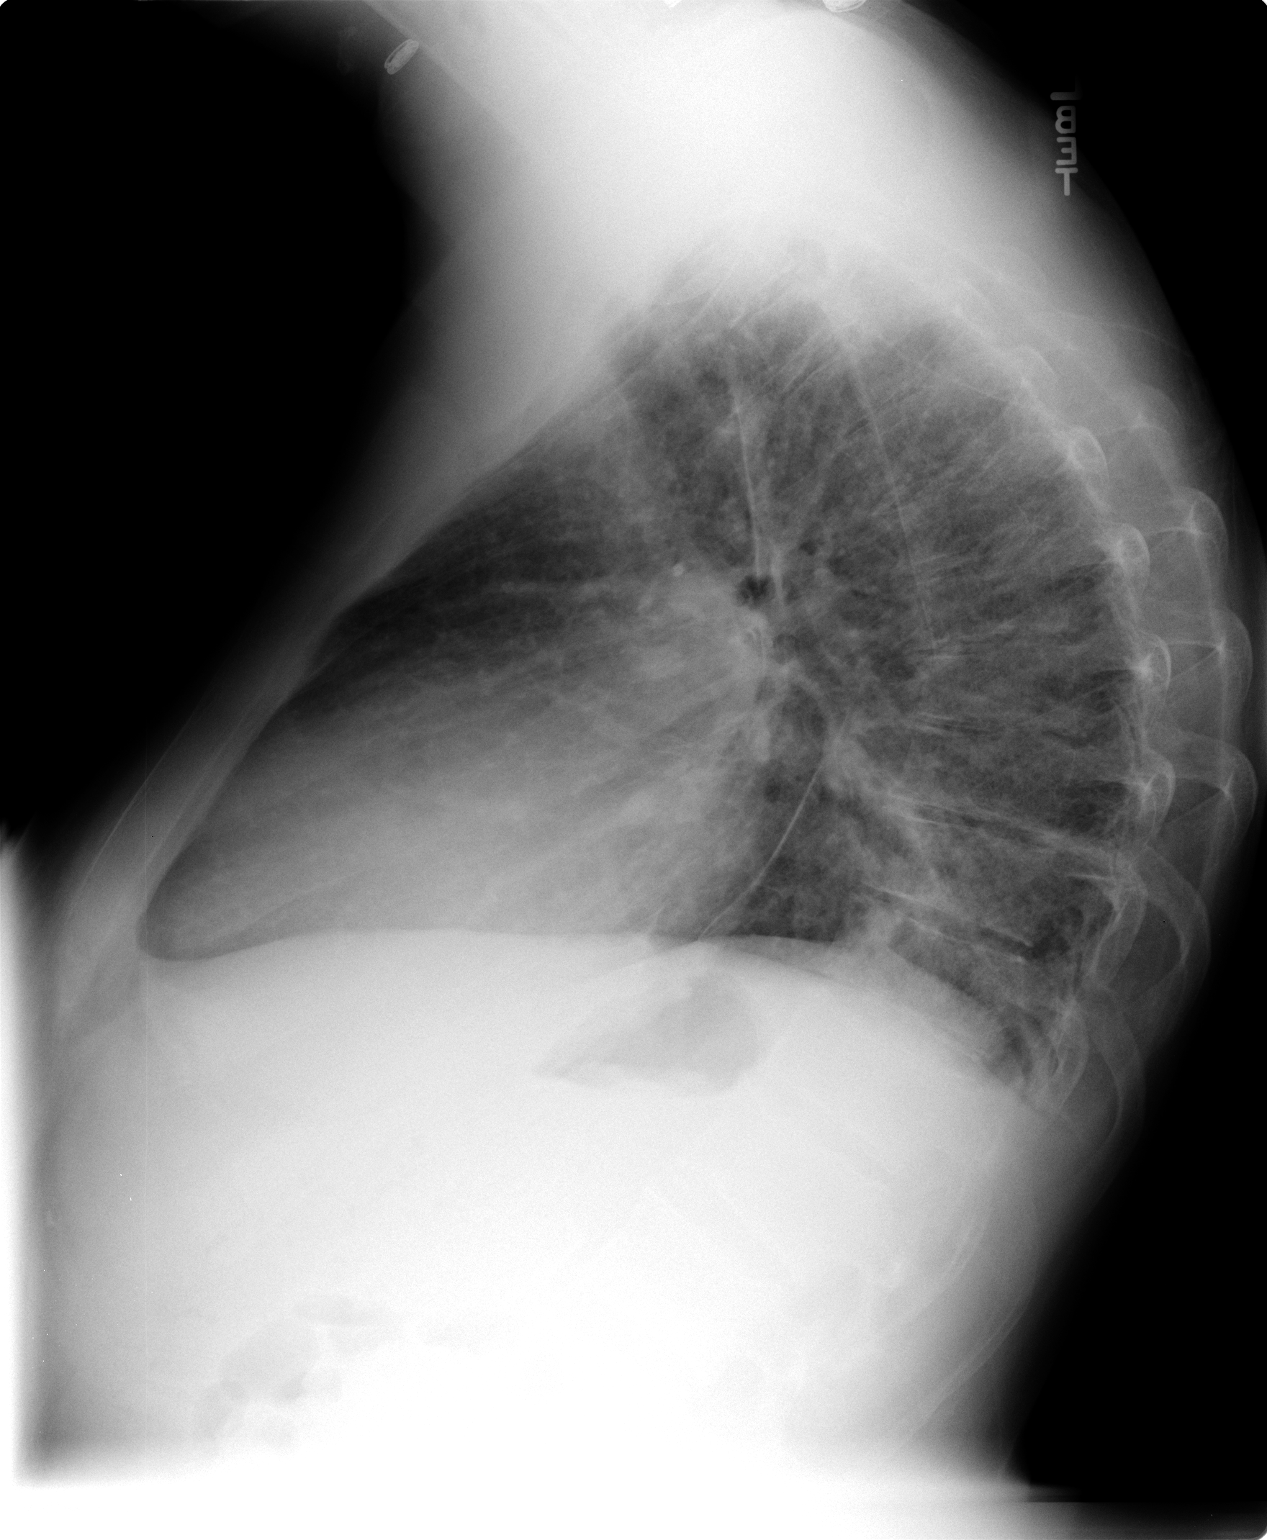

[2 of 2 positions shown; findings below may reference images not displayed]

FINDINGS: The previously demonstrated bilateral airspace opacities
have almost completely resolved with minimal residual opacity at
both lung bases.  Stable enlarged cardiac silhouette and prominence
of the pulmonary vasculature and interstitial markings.  Stable
mild diffuse peribronchial thickening.  Diffuse osteopenia.  Small
left pleural effusion.
IMPRESSION: 1.  Significantly improved changes of congestive heart failure.
2.  Stable mild bronchitic changes.

## 2012-04-24 ENCOUNTER — Encounter (HOSPITAL_COMMUNITY): Payer: Self-pay

## 2012-04-24 ENCOUNTER — Emergency Department (HOSPITAL_COMMUNITY)
Admission: EM | Admit: 2012-04-24 | Discharge: 2012-04-24 | Disposition: A | Discharge status: Home / Self Care | Payer: Medicare Other | Attending: Emergency Medicine | Admitting: Emergency Medicine

## 2012-04-24 DIAGNOSIS — W010XXA Fall on same level from slipping, tripping and stumbling without subsequent striking against object, initial encounter: Secondary | ICD-10-CM | POA: Insufficient documentation

## 2012-04-24 DIAGNOSIS — M545 Low back pain, unspecified: Secondary | ICD-10-CM | POA: Insufficient documentation

## 2012-04-24 DIAGNOSIS — M79609 Pain in unspecified limb: Secondary | ICD-10-CM | POA: Insufficient documentation

## 2012-04-24 DIAGNOSIS — G8929 Other chronic pain: Secondary | ICD-10-CM | POA: Insufficient documentation

## 2012-04-24 DIAGNOSIS — I2589 Other forms of chronic ischemic heart disease: Secondary | ICD-10-CM | POA: Insufficient documentation

## 2012-04-24 DIAGNOSIS — N186 End stage renal disease: Secondary | ICD-10-CM | POA: Insufficient documentation

## 2012-04-24 DIAGNOSIS — M549 Dorsalgia, unspecified: Secondary | ICD-10-CM

## 2012-04-24 DIAGNOSIS — F319 Bipolar disorder, unspecified: Secondary | ICD-10-CM | POA: Insufficient documentation

## 2012-04-24 DIAGNOSIS — J449 Chronic obstructive pulmonary disease, unspecified: Secondary | ICD-10-CM | POA: Insufficient documentation

## 2012-04-24 DIAGNOSIS — J4489 Other specified chronic obstructive pulmonary disease: Secondary | ICD-10-CM | POA: Insufficient documentation

## 2012-04-24 DIAGNOSIS — I120 Hypertensive chronic kidney disease with stage 5 chronic kidney disease or end stage renal disease: Secondary | ICD-10-CM | POA: Insufficient documentation

## 2012-04-24 DIAGNOSIS — Z992 Dependence on renal dialysis: Secondary | ICD-10-CM | POA: Insufficient documentation

## 2012-04-24 MED ORDER — HYDROMORPHONE HCL PF 1 MG/ML IJ SOLN
1.0000 mg | Freq: Once | INTRAMUSCULAR | Status: AC
  Administered 2012-04-24: 1 mg via INTRAMUSCULAR
  Filled 2012-04-24: qty 1

## 2012-04-24 NOTE — Discharge Instructions (Signed)
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
## 2012-04-24 NOTE — ED Notes (Signed)
Pt states was assisting sibling move a roll top desk, loosing his balance causing him to her his back. Denies bowel/bladder incontinence. Pulses equal.

## 2012-04-24 NOTE — ED Notes (Signed)
Pt reports Saturday pt was moving furniture and fell.  C/O lower back pain radiating down r leg.

## 2012-04-25 ENCOUNTER — Encounter (HOSPITAL_COMMUNITY): Payer: Self-pay

## 2012-04-25 ENCOUNTER — Emergency Department (HOSPITAL_COMMUNITY): Payer: Medicare Other

## 2012-04-25 ENCOUNTER — Emergency Department (HOSPITAL_COMMUNITY)
Admission: EM | Admit: 2012-04-25 | Discharge: 2012-04-25 | Disposition: A | Discharge status: Home / Self Care | Payer: Medicare Other | Attending: Emergency Medicine | Admitting: Emergency Medicine

## 2012-04-25 DIAGNOSIS — Z8659 Personal history of other mental and behavioral disorders: Secondary | ICD-10-CM | POA: Insufficient documentation

## 2012-04-25 DIAGNOSIS — J4489 Other specified chronic obstructive pulmonary disease: Secondary | ICD-10-CM | POA: Insufficient documentation

## 2012-04-25 DIAGNOSIS — I714 Abdominal aortic aneurysm, without rupture, unspecified: Secondary | ICD-10-CM | POA: Insufficient documentation

## 2012-04-25 DIAGNOSIS — I120 Hypertensive chronic kidney disease with stage 5 chronic kidney disease or end stage renal disease: Secondary | ICD-10-CM | POA: Insufficient documentation

## 2012-04-25 DIAGNOSIS — S39012A Strain of muscle, fascia and tendon of lower back, initial encounter: Secondary | ICD-10-CM

## 2012-04-25 DIAGNOSIS — J449 Chronic obstructive pulmonary disease, unspecified: Secondary | ICD-10-CM | POA: Insufficient documentation

## 2012-04-25 DIAGNOSIS — N186 End stage renal disease: Secondary | ICD-10-CM | POA: Insufficient documentation

## 2012-04-25 DIAGNOSIS — Z79899 Other long term (current) drug therapy: Secondary | ICD-10-CM | POA: Insufficient documentation

## 2012-04-25 DIAGNOSIS — M545 Low back pain, unspecified: Secondary | ICD-10-CM | POA: Insufficient documentation

## 2012-04-25 DIAGNOSIS — I2589 Other forms of chronic ischemic heart disease: Secondary | ICD-10-CM | POA: Insufficient documentation

## 2012-04-25 DIAGNOSIS — G894 Chronic pain syndrome: Secondary | ICD-10-CM | POA: Insufficient documentation

## 2012-04-25 DIAGNOSIS — Z992 Dependence on renal dialysis: Secondary | ICD-10-CM | POA: Insufficient documentation

## 2012-04-25 DIAGNOSIS — F17200 Nicotine dependence, unspecified, uncomplicated: Secondary | ICD-10-CM | POA: Insufficient documentation

## 2012-04-25 DIAGNOSIS — Z7982 Long term (current) use of aspirin: Secondary | ICD-10-CM | POA: Insufficient documentation

## 2012-04-25 MED ORDER — CYCLOBENZAPRINE HCL 10 MG PO TABS: 10.0000 mg | ORAL_TABLET | Freq: Three times a day (TID) | ORAL | Status: DC | PRN

## 2012-04-25 MED ORDER — CYCLOBENZAPRINE HCL 10 MG PO TABS
10.0000 mg | ORAL_TABLET | Freq: Once | ORAL | Status: AC
  Administered 2012-04-25: 10 mg via ORAL
  Filled 2012-04-25: qty 1

## 2012-04-25 NOTE — ED Notes (Signed)
Pain  Rt lower back, says he fell on Saturday,  Seen here for same, Says he feels a "grinding" in his back.

## 2012-04-25 NOTE — ED Notes (Signed)
Pt reports falling while moving some furniture the other day.  Pt reports "it felt like something was grinding in my back".  Pt denies hitting his head when he fell.

## 2012-04-25 NOTE — Discharge Instructions (Signed)
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
## 2012-04-26 NOTE — ED Provider Notes (Signed)
Medical screening examination/treatment/procedure(s) were performed by non-physician practitioner and as supervising physician I was immediately available for consultation/collaboration.   [name], MD 04/26/12 [phone]

## 2012-04-29 NOTE — ED Provider Notes (Signed)
Medical screening examination/treatment/procedure(s) were performed by non-physician practitioner and as supervising physician I was immediately available for consultation/collaboration.   Carleene Cooper III, MD 04/29/12 1346

## 2012-05-02 IMAGING — CT CT ABD-PELV W/O CM
3 of 4 series · 7 of 46 positions shown, 13 images · non-contrast
Comparison: 10/01/2010 ultrasound.  09/08/2010 and 10/13/2008 CT.

CLINICAL DATA: Abdominal pain.  End-stage renal disease.

CT ABDOMEN AND PELVIS WITHOUT CONTRAST
TECHNIQUE: Multidetector CT imaging of the abdomen and pelvis was
performed following the standard protocol without intravenous
contrast.

[Series 3: lung 5.0 b60f · axial · 0.87mm/px · z∈[-128,-88]mm · 3 of 17 slices shown, 7 images]
[im 5/17  soft-tissue]
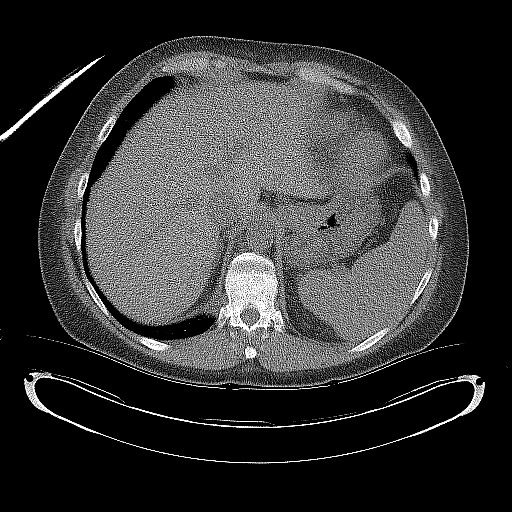
[im 5/17  lung]
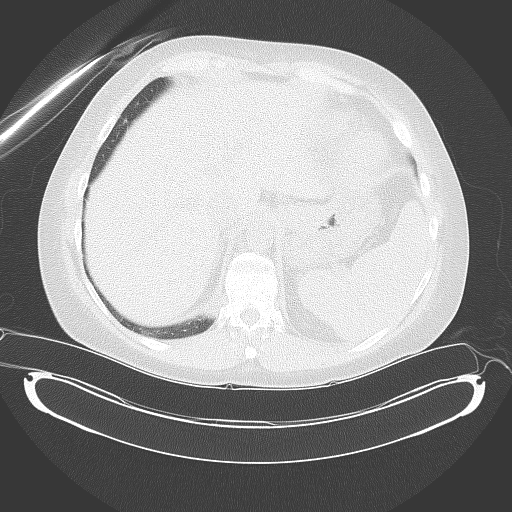
[im 5/17  bone]
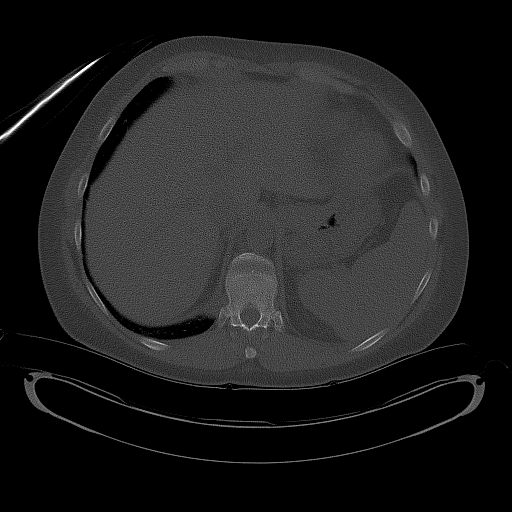
[im 9/17  soft-tissue]
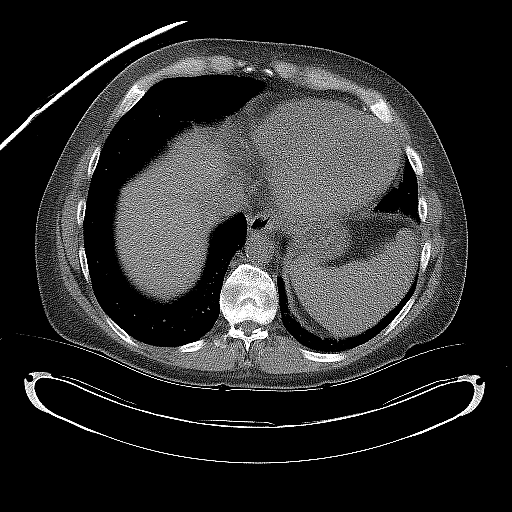
[im 9/17  lung]
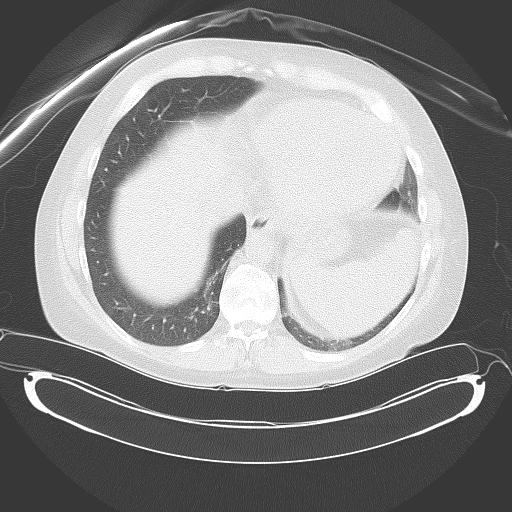
[im 13/17  soft-tissue]
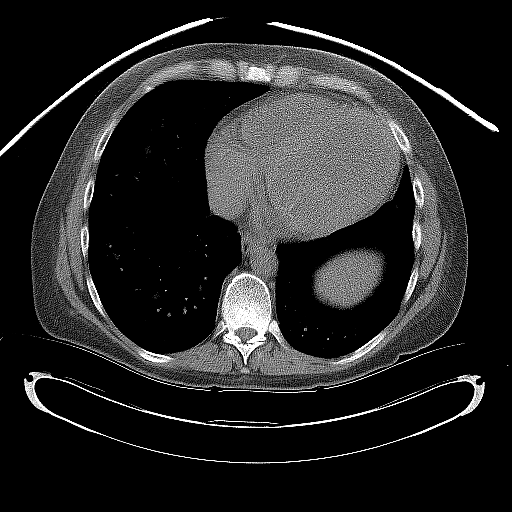
[im 13/17  lung]
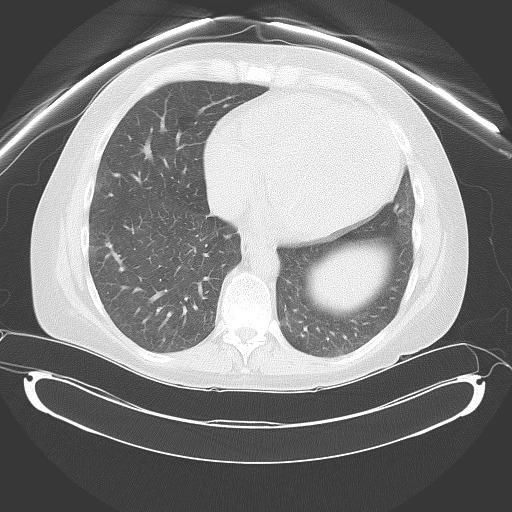

[Series 4: mpr coronal (id) · coronal · 0.75mm/px · 3 of 90 slices shown, 4 images]
[im 30/90  soft-tissue]
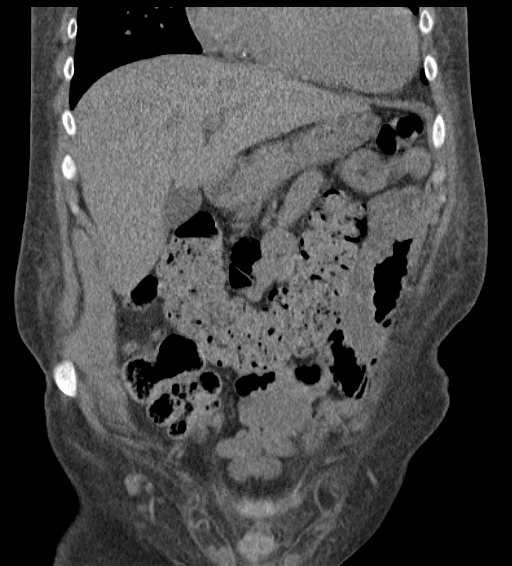
[im 40/90  soft-tissue]
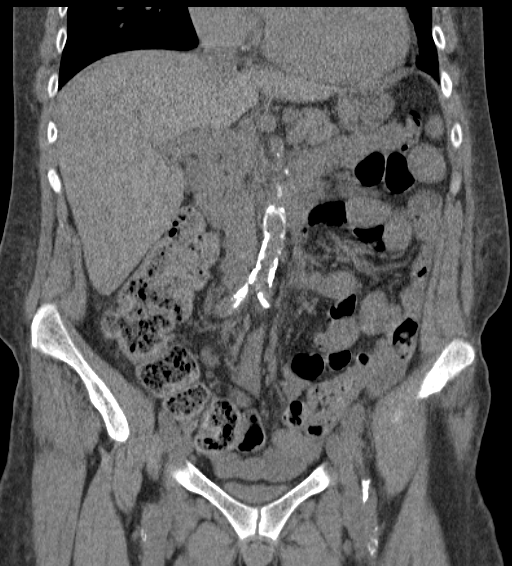
[im 40/90  bone]
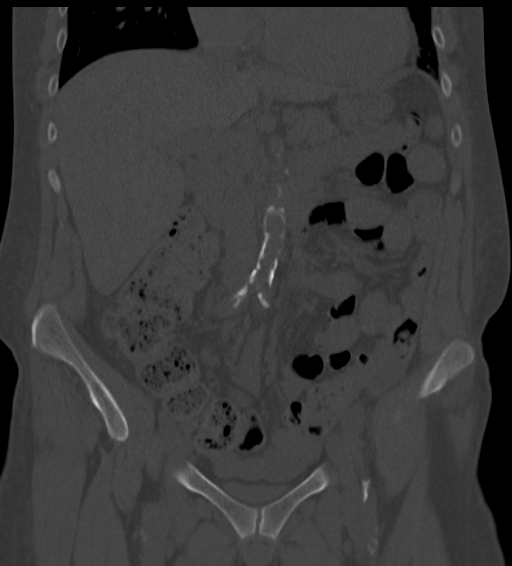
[im 50/90  soft-tissue]
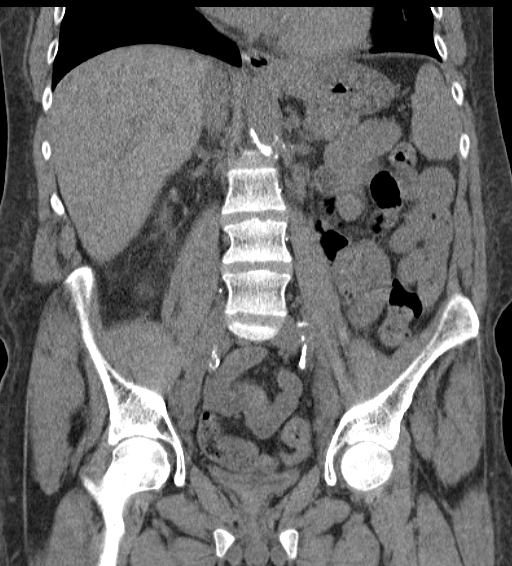

[Series 5: mpr sagittal (id) · sagittal · 0.56mm/px · 1 of 126 slices shown, 2 images]
[im 42/126  soft-tissue]
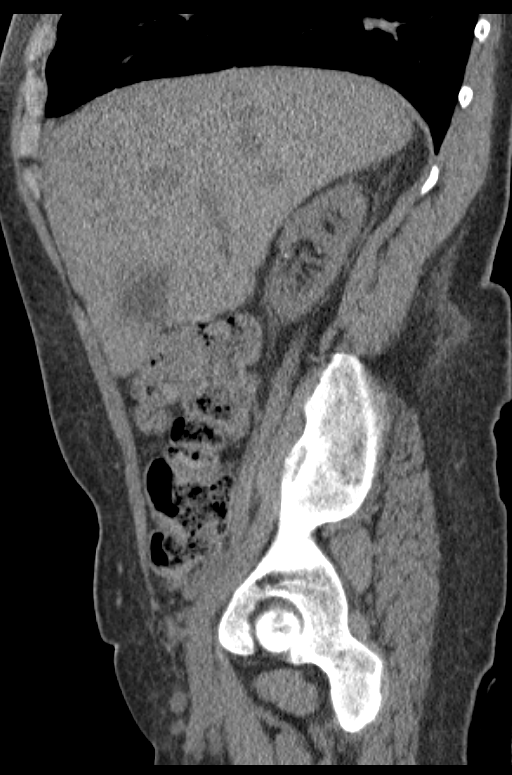
[im 42/126  bone]
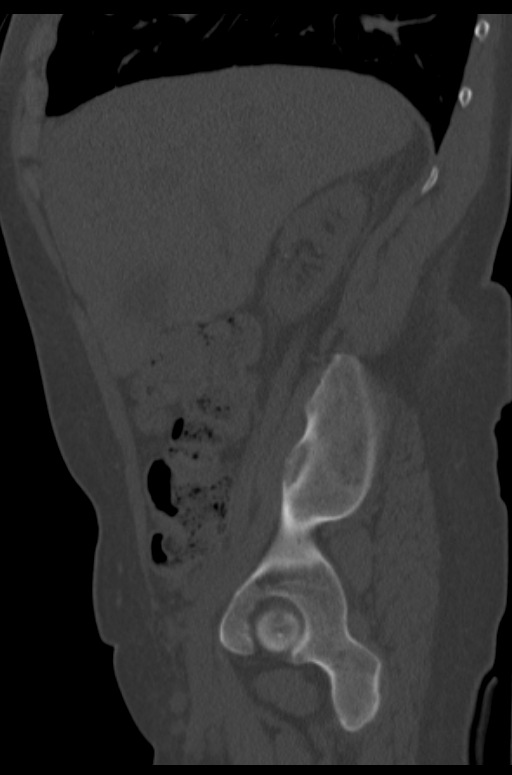

[7 of 46 positions shown; findings below may reference images not displayed]

FINDINGS: The ultrasound detected gallbladder wall thickening
cannot be adequately addressed on CT.  No calcified gallstones.

Prominent coronary artery calcifications.  Cardiomegaly.
Atherosclerotic type changes abdominal aorta branch vessels.  No
abdominal aortic aneurysm.

Atrophic kidneys without hydronephrosis.

Lung bases clear.  No free air.  Third spacing of fluid without to
free fluid in the pelvis/abdomen.  Enlarged liver without focal
liver lesion detected on this unenhanced exam.  Unenhanced imaging
without focal splenic, adrenal, right renal or pancreatic lesion.
Left lower pole posteriorly directed 1.0 cm structure cannot be
confirmed as a cyst.  This appears be to be a cyst on prior
ultrasound.

Sigmoid diverticula without findings of inflammation.  Appendix not
visualized.  No obvious bowel inflammatory process.  Evaluation
limited without contrast.

Renal dystrophy type changes of the lumbar and lower thoracic
spine.
IMPRESSION: The ultrasound detected gallbladder wall thickening cannot be
adequately addressed on CT.  No calcified gallstones.

Atherosclerotic type changes coronary arteries, aorta branch
vessels.

Cardiomegaly.

Atrophic kidneys.

Third spacing of fluid.

## 2012-05-04 IMAGING — NM NM HEPATO W/GB/PHARM/[PERSON_NAME]
2 series · 12 of 12 positions shown · non-contrast
Comparison: PET CT 10/18/2010.  Ultrasound 10/01/2010.

CLINICAL DATA: Right upper quadrant pain, nausea.

NUCLEAR MEDICINE HEPATOBILIARY IMAGING WITH GALLBLADDER EF
TECHNIQUE: Sequential images of the abdomen were obtained [DATE]
minutes following intravenous administration of
radiopharmaceutical.  After slow intravenous infusion of 9.03ucg
Cholecystokinin, gallbladder ejection fraction was determined.
Radiopharmaceutical:  9.EmFi Vc-VVm Choletec

[Series 1: gb hepatobiliary scan · 3.19mm/px · 6 of 30 frames shown (1 of 2)]
[frame 3/30]
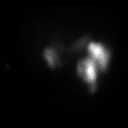
[frame 8/30]
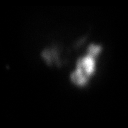
[frame 13/30]
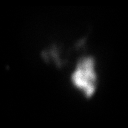
[frame 18/30]
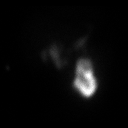
[frame 23/30]
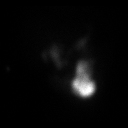
[frame 28/30]
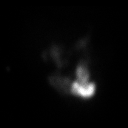

[Series 1: gb hepatobiliary scan · 3.19mm/px · 6 of 60 frames shown (2 of 2)]
[frame 6/60]
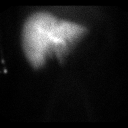
[frame 16/60]
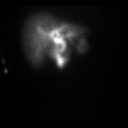
[frame 26/60]
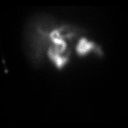
[frame 36/60]
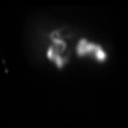
[frame 46/60]
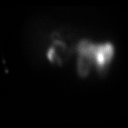
[frame 56/60]
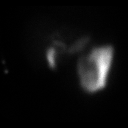

[12 of 12 positions shown; findings below may reference images not displayed]

FINDINGS: There is prompt uptake and excretion of radiotracer by
the liver.  No evidence of cystic duct or common duct obstruction.
Gallbladder ejection fraction is 69%.  Normal is greater than 30%.

The patient did not experience symptoms during CCK infusion.
IMPRESSION: Unremarkable study.

## 2012-05-05 ENCOUNTER — Encounter (HOSPITAL_COMMUNITY): Payer: Self-pay | Admitting: Emergency Medicine

## 2012-05-05 ENCOUNTER — Emergency Department (HOSPITAL_COMMUNITY)
Admission: EM | Admit: 2012-05-05 | Discharge: 2012-05-05 | Disposition: A | Discharge status: Home / Self Care | Payer: Medicare Other | Attending: Emergency Medicine | Admitting: Emergency Medicine

## 2012-05-05 DIAGNOSIS — Z79899 Other long term (current) drug therapy: Secondary | ICD-10-CM | POA: Insufficient documentation

## 2012-05-05 DIAGNOSIS — I2589 Other forms of chronic ischemic heart disease: Secondary | ICD-10-CM | POA: Insufficient documentation

## 2012-05-05 DIAGNOSIS — W010XXA Fall on same level from slipping, tripping and stumbling without subsequent striking against object, initial encounter: Secondary | ICD-10-CM | POA: Insufficient documentation

## 2012-05-05 DIAGNOSIS — J4489 Other specified chronic obstructive pulmonary disease: Secondary | ICD-10-CM | POA: Insufficient documentation

## 2012-05-05 DIAGNOSIS — Z9861 Coronary angioplasty status: Secondary | ICD-10-CM | POA: Insufficient documentation

## 2012-05-05 DIAGNOSIS — M545 Low back pain, unspecified: Secondary | ICD-10-CM | POA: Insufficient documentation

## 2012-05-05 DIAGNOSIS — G894 Chronic pain syndrome: Secondary | ICD-10-CM | POA: Insufficient documentation

## 2012-05-05 DIAGNOSIS — G8929 Other chronic pain: Secondary | ICD-10-CM | POA: Insufficient documentation

## 2012-05-05 DIAGNOSIS — J449 Chronic obstructive pulmonary disease, unspecified: Secondary | ICD-10-CM | POA: Insufficient documentation

## 2012-05-05 DIAGNOSIS — N186 End stage renal disease: Secondary | ICD-10-CM | POA: Insufficient documentation

## 2012-05-05 DIAGNOSIS — I120 Hypertensive chronic kidney disease with stage 5 chronic kidney disease or end stage renal disease: Secondary | ICD-10-CM | POA: Insufficient documentation

## 2012-05-05 DIAGNOSIS — I10 Essential (primary) hypertension: Secondary | ICD-10-CM | POA: Insufficient documentation

## 2012-05-05 DIAGNOSIS — Z992 Dependence on renal dialysis: Secondary | ICD-10-CM | POA: Insufficient documentation

## 2012-05-05 DIAGNOSIS — F209 Schizophrenia, unspecified: Secondary | ICD-10-CM | POA: Insufficient documentation

## 2012-05-05 DIAGNOSIS — F319 Bipolar disorder, unspecified: Secondary | ICD-10-CM | POA: Insufficient documentation

## 2012-05-05 MED ORDER — CYCLOBENZAPRINE HCL 10 MG PO TABS: 10.0000 mg | ORAL_TABLET | Freq: Three times a day (TID) | ORAL | Status: AC | PRN

## 2012-05-05 MED ORDER — HYDROMORPHONE HCL PF 1 MG/ML IJ SOLN
1.0000 mg | Freq: Once | INTRAMUSCULAR | Status: AC
  Administered 2012-05-05: 1 mg via INTRAMUSCULAR
  Filled 2012-05-05: qty 1

## 2012-05-05 NOTE — ED Notes (Signed)
Pt c/o headache and back pain. Pt states he injured his back this am tripping over a stump.

## 2012-05-05 NOTE — Discharge Instructions (Signed)
RESOURCE GUIDE ° °Dental Problems ° °Patients with Medicaid: °[facility]                     [facility] °[address]                                           [address] °Phone:  [phone]                                                  Phone:  [phone] ° °If unable to pay or uninsured, contact:  [facility] or [facility] to become qualified for the adult dental clinic. ° °Chronic Pain Problems °Contact [facility]  [phone] °Patients need to be referred by their primary care doctor. ° °Insufficient Money for Medicine °Contact [employer]:  call "211" or [facility] 271-5999. ° °No Primary Care Doctor °Call [facility]  832-8000 °Other agencies that provide inexpensive medical care °   [facility]  [phone] °   [facility]  [phone] °   [facility]  [phone] °   [facility]  [phone] °   [facility]  [phone] °   [facility]  [phone] ° °Psychological Services °[facility]  [phone] °[facility]  [phone] °[facility]   [phone] (emergency services [phone]) ° °Substance Abuse Resources °[facility]  336-882-2125 °[facility] 336-784-9470 °[facility] 336-285-9073 °[facility] 336-845-3988 °Residential & Outpatient Substance Abuse Program  800-659-3381 ° °Abuse/Neglect °[place] [facility] (336) 641-3795 °[place] [facility] 800-378-5315 (After Hours) ° °Emergency Shelter ° [facility] (336) 271-5985 ° °Maternity Homes °[facility] (336) 275-9566 °[facility] (704) 372-4663 ° °MRSA Hotline #:   832-7006 ° ° ° °[place] [facility] ° °[facility] [facility]     [employer]                          [facility] °[address] [place]                       [address]      [address]  °[place]                                                [place]                            [place] °Phone:  [phone]                                   Phone:  [phone]                 Phone:  342-8140 ° °[facility] °Phone:  342-8316 ° °  [place] [facility] Hotline °(336) 342-1394 °(336) 342-3537 (After Hours) ° ° °Take the prescription as directed.  Apply moist heat or ice to the area(s) of discomfort, for 15 minutes at a time, several times per day for the next few days.  Do not fall asleep on a heating or ice pack.  Call your regular medical doctor on Monday to schedule a follow up appointment this week.  Return to the Emergency Department immediately if worsening. ° °

## 2012-05-05 NOTE — ED Notes (Signed)
Pt's bp elevated.  PT says hasn't taken bp medication yet.  Notified Dr Clarene Duke, ok to d/c pt.
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
## 2012-05-06 IMAGING — CR DG CHEST 1V PORT
1 series · 1 of 1 positions shown · non-contrast
Comparison: [HOSPITAL] chest x-ray 04/24/2010, 10/01/2010,
CT angio chest 10/04/2010, and 08/05/2010 chest x-ray.

CLINICAL DATA: PORTABLE CHEST - 1 VIEW

[view not recorded]
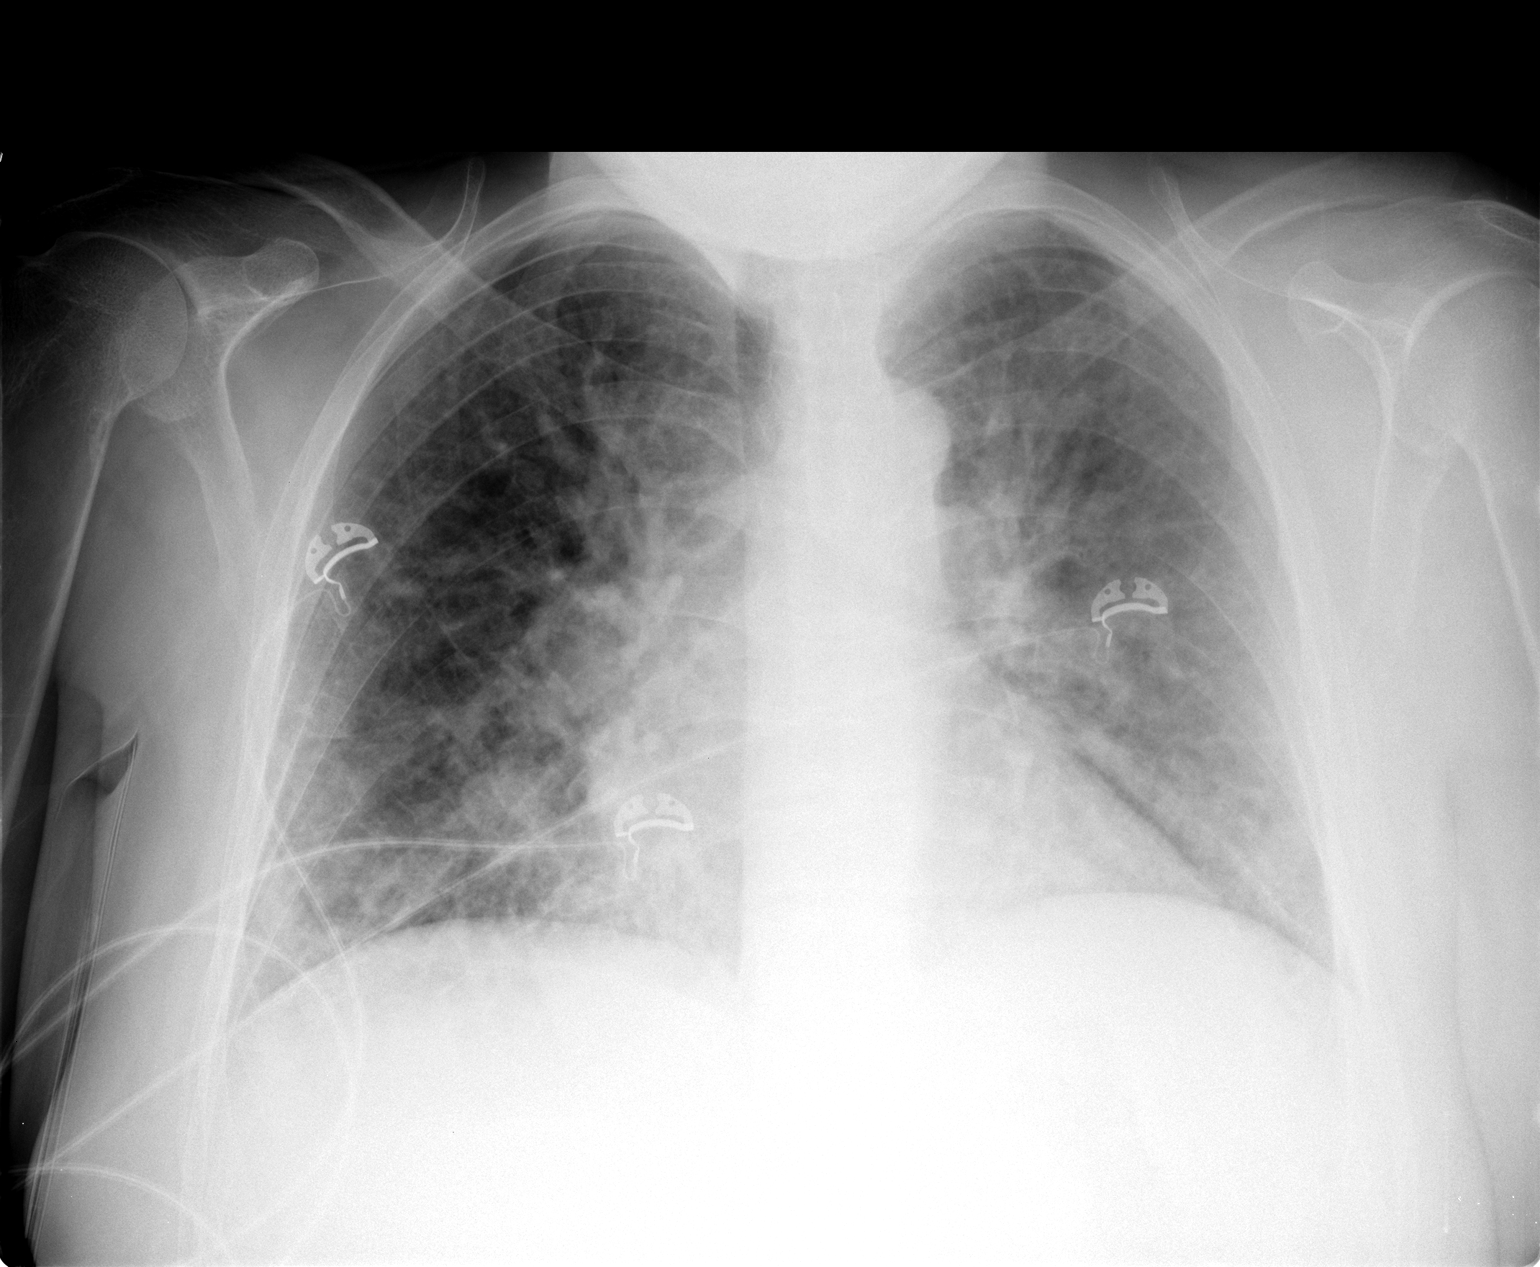

[1 of 1 positions shown; findings below may reference images not displayed]

FINDINGS: Since 10/05/2010, progressive pulmonary vascular
engorgement with EMS/shortness of breath.  (Left greater than
right) diffuse airspace opacities with low lung volumes and stable
cardiomegaly seen.
IMPRESSION: Since 10/05/2010, progressive congestive heart failure findings.

## 2012-05-09 IMAGING — CR DG ABDOMEN ACUTE W/ 1V CHEST
3 series · 3 of 3 positions shown · non-contrast
Comparison: Abdominal CT 10/18/2010.  Acute abdominal series
09/23/2010.

CLINICAL DATA: Abdominal pain.

ACUTE ABDOMEN SERIES (ABDOMEN 2 VIEW & CHEST 1 VIEW)

[view not recorded (1 of 3)]
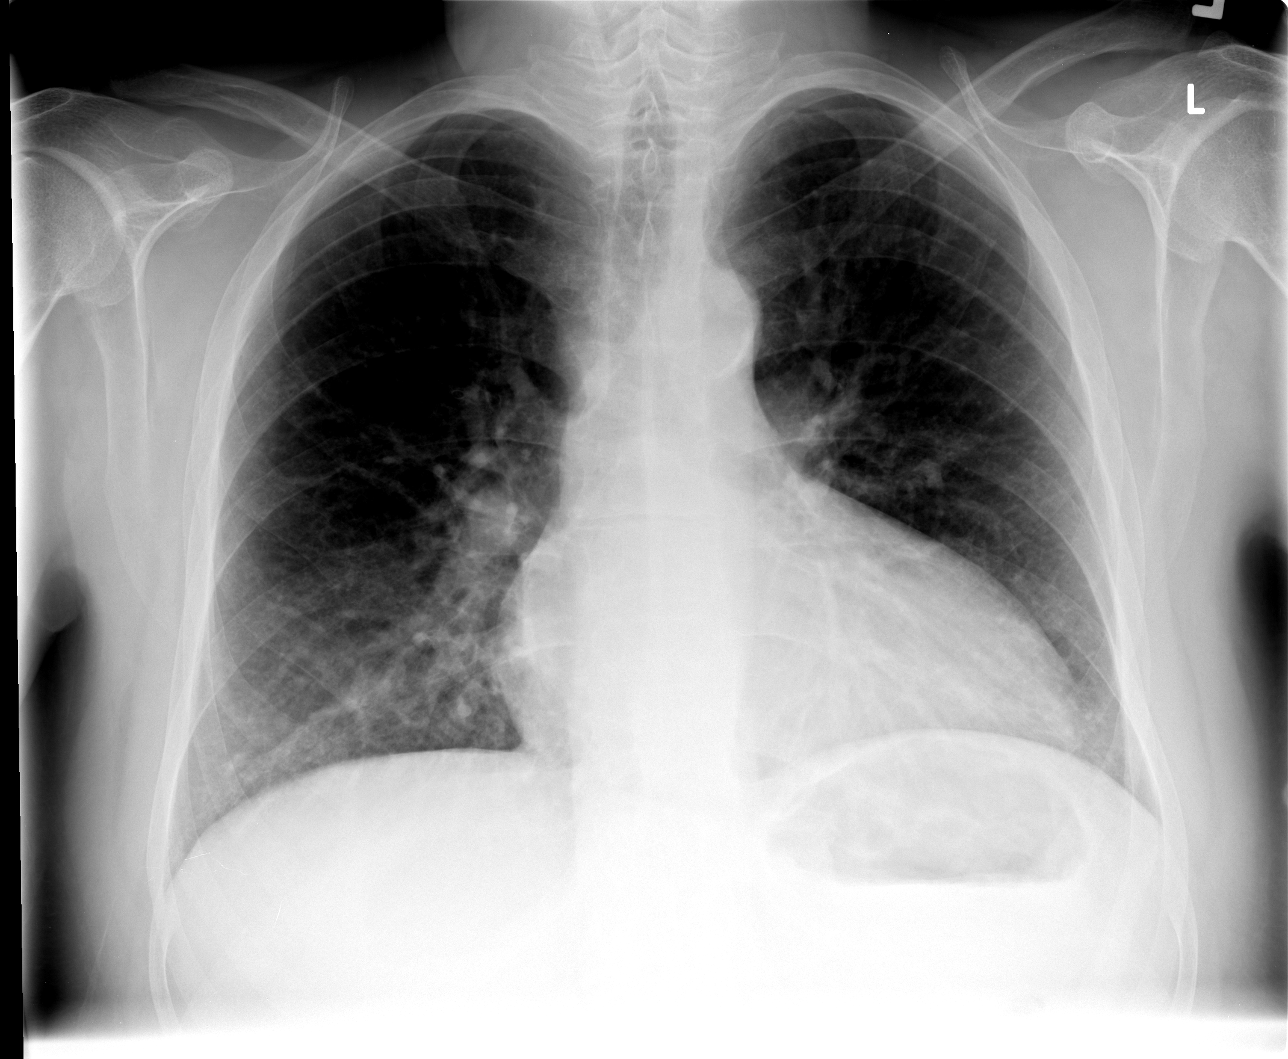

[view not recorded (2 of 3)]
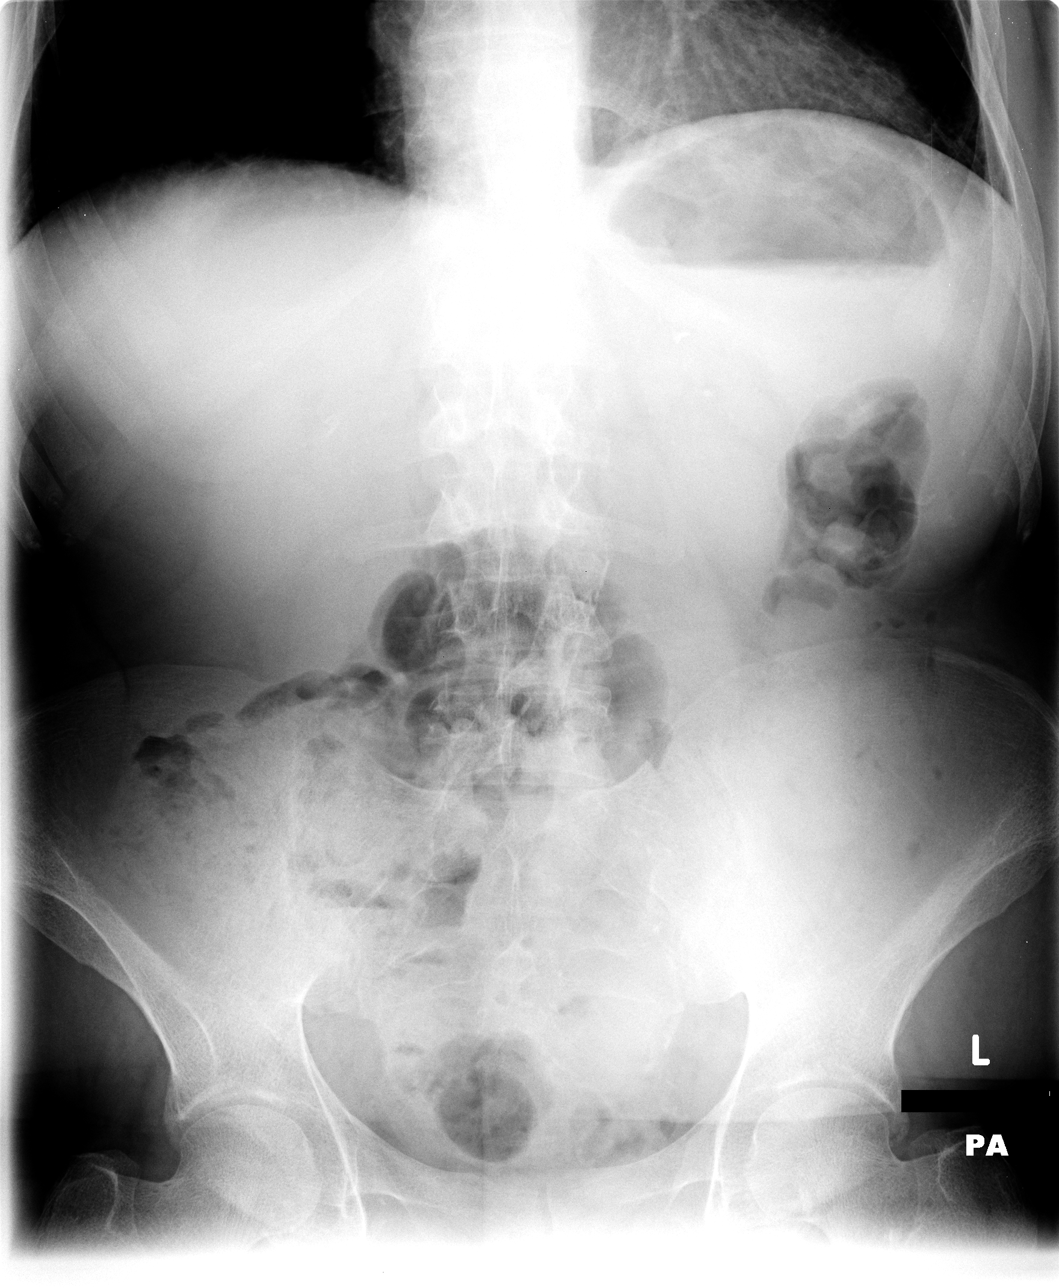

[view not recorded (3 of 3)]
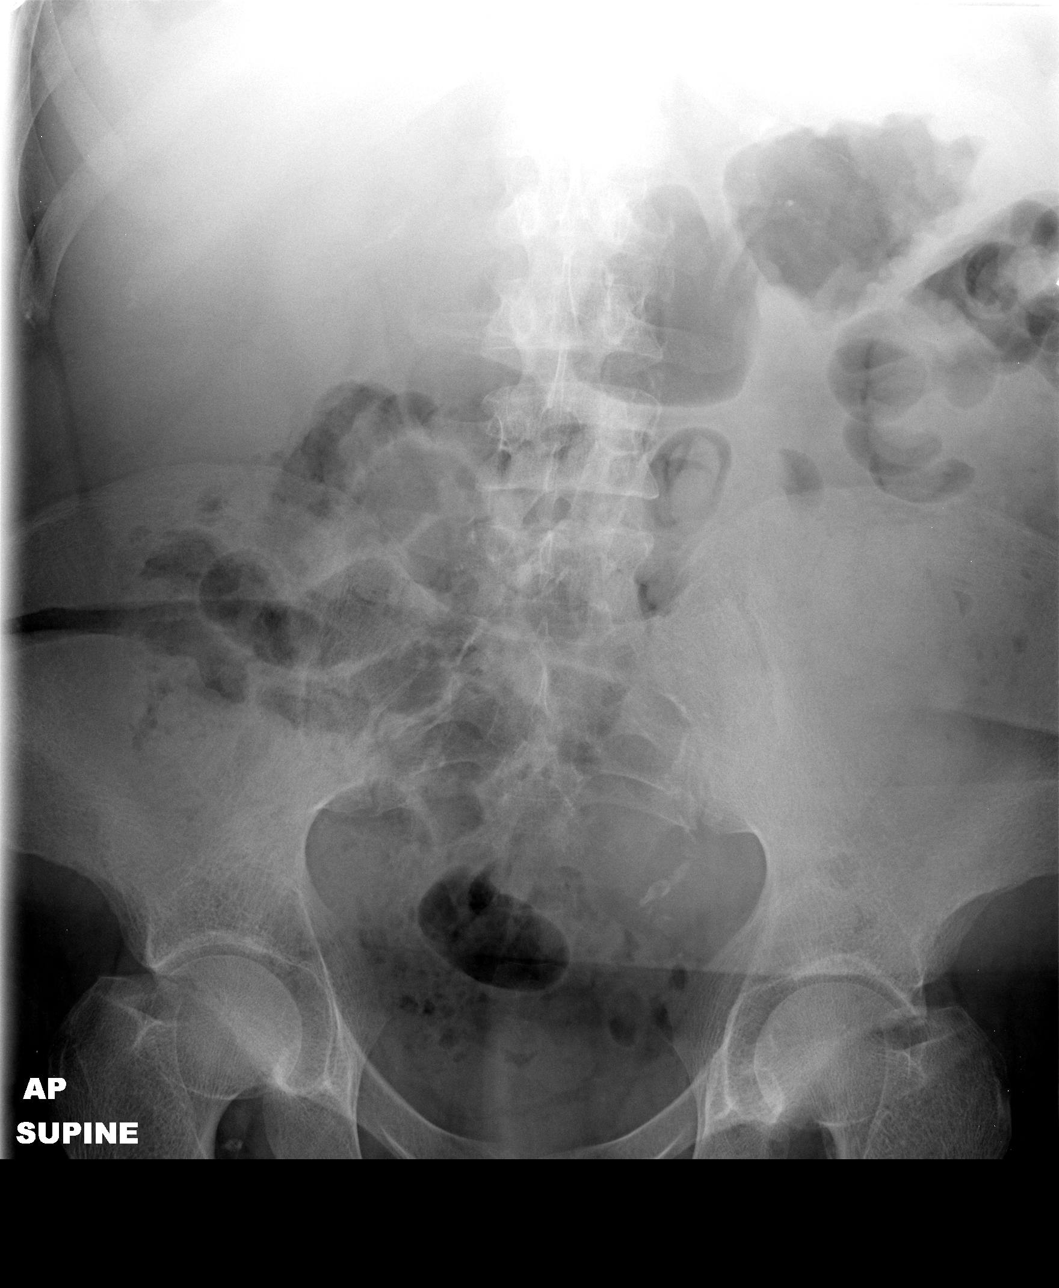

[3 of 3 positions shown; findings below may reference images not displayed]

FINDINGS: The heart size is stable at the upper limits of normal.
There is stable vascular congestion without overt pulmonary edema
or confluent airspace opacity.  There is no pleural effusion.

The bowel gas pattern is normal.  There is no free intraperitoneal
air.  Scattered vascular calcifications are stable.
IMPRESSION: No acute cardiopulmonary or abdominal process demonstrated.
Scattered vascular calcifications.

## 2012-05-10 ENCOUNTER — Encounter (HOSPITAL_COMMUNITY): Payer: Self-pay | Admitting: *Deleted

## 2012-05-10 ENCOUNTER — Emergency Department (HOSPITAL_COMMUNITY)
Admission: EM | Admit: 2012-05-10 | Discharge: 2012-05-10 | Disposition: A | Discharge status: Home / Self Care | Payer: Medicare Other | Attending: Emergency Medicine | Admitting: Emergency Medicine

## 2012-05-10 DIAGNOSIS — Z992 Dependence on renal dialysis: Secondary | ICD-10-CM | POA: Insufficient documentation

## 2012-05-10 DIAGNOSIS — R51 Headache: Secondary | ICD-10-CM | POA: Insufficient documentation

## 2012-05-10 DIAGNOSIS — J449 Chronic obstructive pulmonary disease, unspecified: Secondary | ICD-10-CM | POA: Insufficient documentation

## 2012-05-10 DIAGNOSIS — F319 Bipolar disorder, unspecified: Secondary | ICD-10-CM | POA: Insufficient documentation

## 2012-05-10 DIAGNOSIS — R110 Nausea: Secondary | ICD-10-CM | POA: Insufficient documentation

## 2012-05-10 DIAGNOSIS — I2589 Other forms of chronic ischemic heart disease: Secondary | ICD-10-CM | POA: Insufficient documentation

## 2012-05-10 DIAGNOSIS — N186 End stage renal disease: Secondary | ICD-10-CM | POA: Insufficient documentation

## 2012-05-10 DIAGNOSIS — J4489 Other specified chronic obstructive pulmonary disease: Secondary | ICD-10-CM | POA: Insufficient documentation

## 2012-05-10 DIAGNOSIS — I120 Hypertensive chronic kidney disease with stage 5 chronic kidney disease or end stage renal disease: Secondary | ICD-10-CM | POA: Insufficient documentation

## 2012-05-10 MED ORDER — HYDROMORPHONE HCL PF 1 MG/ML IJ SOLN
1.0000 mg | Freq: Once | INTRAMUSCULAR | Status: AC
  Administered 2012-05-10: 1 mg via INTRAMUSCULAR
  Filled 2012-05-10: qty 1

## 2012-05-10 MED ORDER — PROMETHAZINE HCL 12.5 MG PO TABS
25.0000 mg | ORAL_TABLET | Freq: Once | ORAL | Status: AC
  Administered 2012-05-10: 25 mg via ORAL
  Filled 2012-05-10: qty 2

## 2012-05-10 NOTE — ED Notes (Signed)
Pt presents to Ed secondary to a migraine headache x 2 days. Pt reports nausea denies vomiting, change of vision and weakness. Pt does report sensitivity to light. Pt is alert and orientated x 4. Skin warm and dry. Pt has an ashen green skin color which is the pt's norm. Pt has a long history of dialysis and multiple other medical conditions.

## 2012-05-10 NOTE — ED Notes (Signed)
"  I have a migraine",  Nausea  No HI

## 2012-05-10 NOTE — Discharge Instructions (Signed)
General Headache, Without Cause A general headache has no specific cause. These headaches are not life-threatening. They will not lead to other types of headaches. HOME CARE   Make and keep follow-up visits with your doctor.   Only take medicine as told by your doctor.   Try to relax, get a massage, or use your thoughts to control your body (biofeedback).   Apply cold or heat to the head and neck. Apply 3 or 4 times a day or as needed.  Finding out the results of your test Ask when your test results will be ready. Make sure you get your test results. GET HELP RIGHT AWAY IF:   You have problems with medicine.   Your medicine does not help relieve pain.   Your headache changes or becomes worse.   You feel sick to your stomach (nauseous) or throw up (vomit).   You have a temperature by mouth above 102 F (38.9 C), not controlled by medicine.   Your have a stiff neck.   You have vision loss.   You have muscle weakness.   You lose control of your muscles.   You lose balance or have trouble walking.   You feel like you are going to pass out (faint).  MAKE SURE YOU:   Understand these instructions.   Will watch this condition.   Will get help right away if you are not doing well or get worse.  Document Released: 09/13/2008 Document Revised: 11/24/2011 Document Reviewed: 09/13/2008 [employer] Patient Information 2012 [employer]. 

## 2012-05-11 IMAGING — CR DG ABDOMEN ACUTE W/ 1V CHEST
4 series · 4 of 4 positions shown · non-contrast
Comparison: 10/25/2010.

CLINICAL DATA: History of abdominal pain.  Right lower quadrant
pain for 1 month.

ACUTE ABDOMEN SERIES (ABDOMEN 2 VIEW & CHEST 1 VIEW)

[view not recorded (1 of 4)]
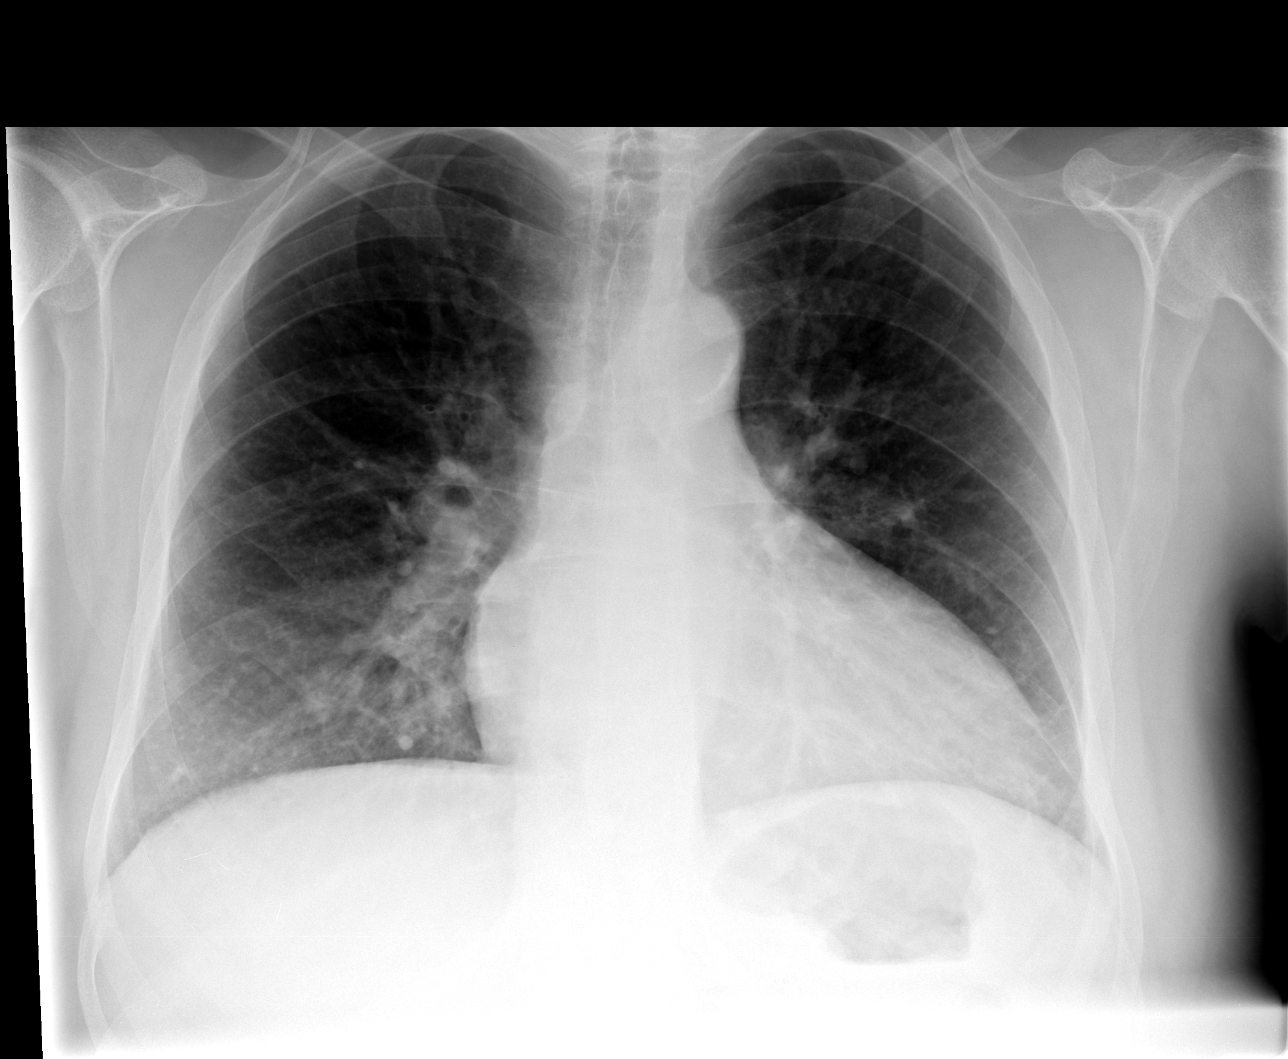

[view not recorded (2 of 4)]
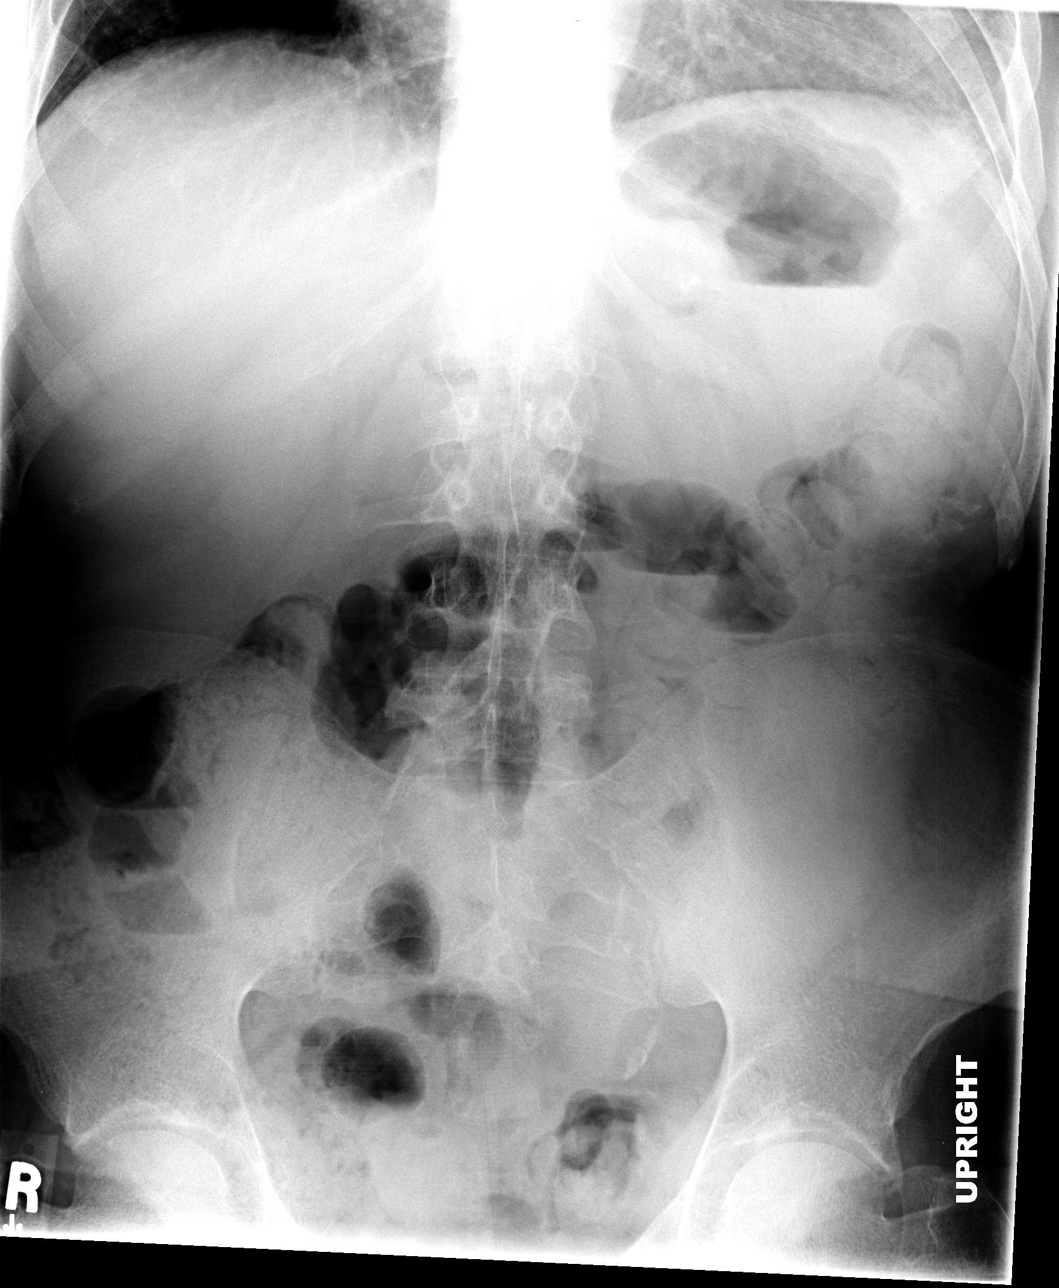

[view not recorded (3 of 4)]
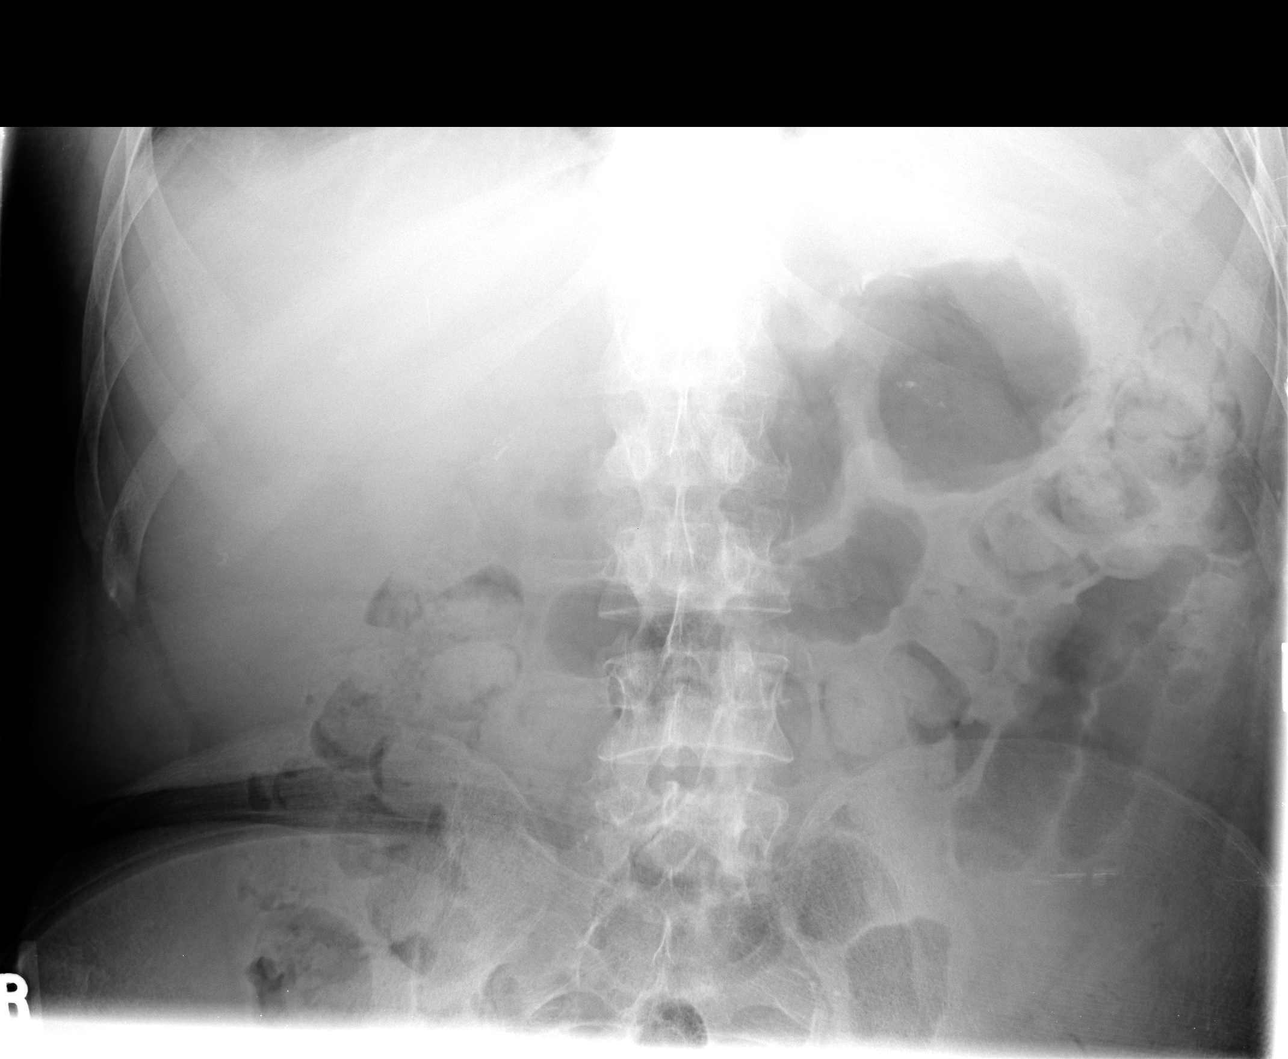

[view not recorded (4 of 4)]
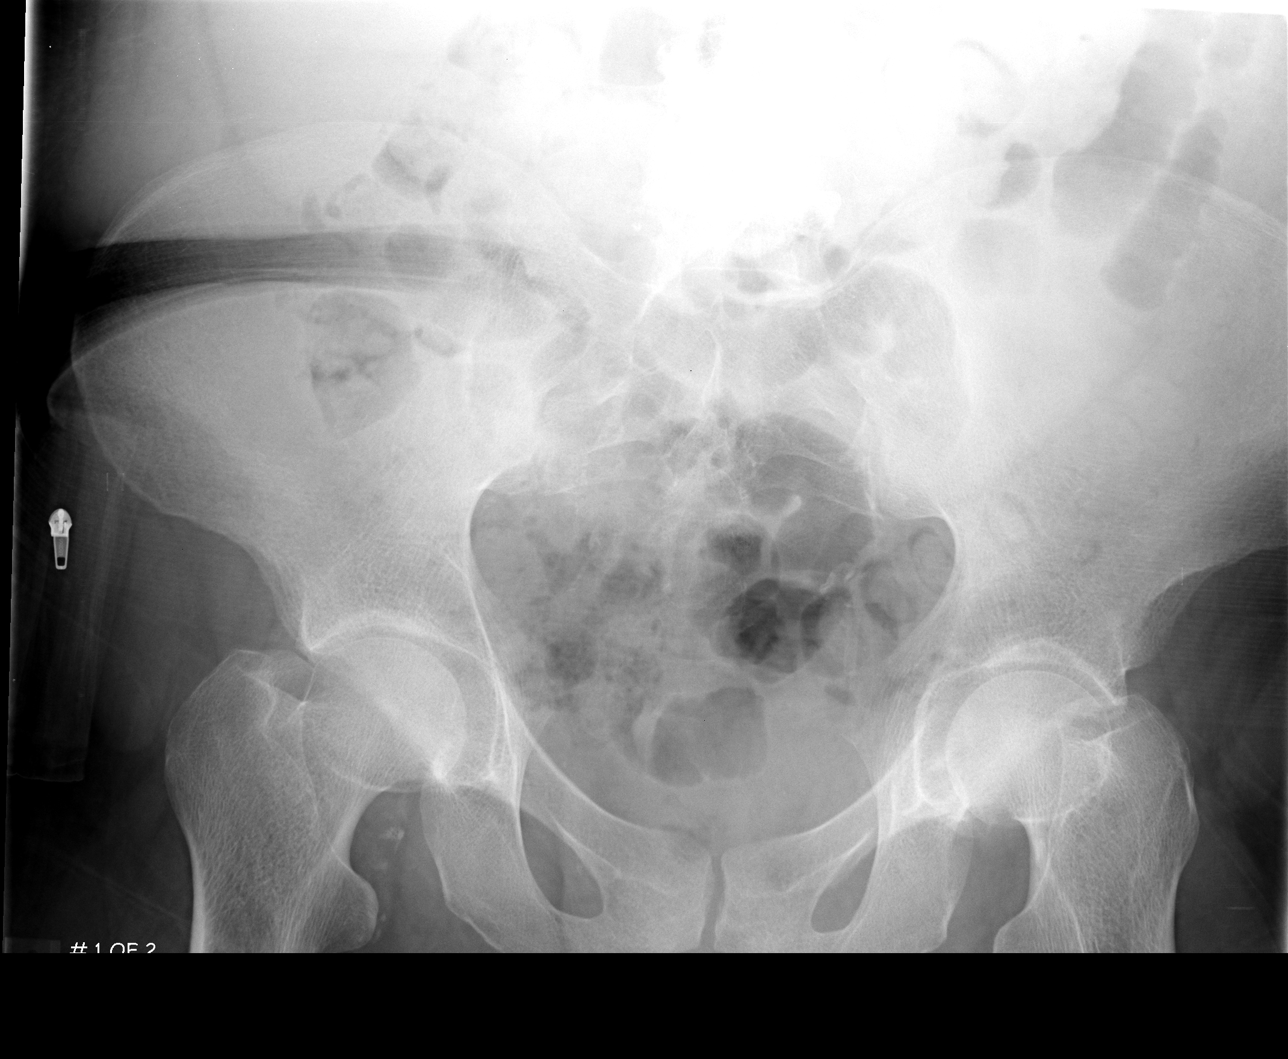

[4 of 4 positions shown; findings below may reference images not displayed]

FINDINGS: There is stable moderate enlargement of the cardiac
silhouette.  No airspace disease or pleural effusion is seen.

No pneumoperitoneum is evident.

There is fecal distention of portions of the colon.  There are
multiple dilated loops of small intestine with moderate dilatation.
There is one possible sentinel loops seen on the erect examination
to the left of L3.

There is osteopenic appearance of bones with slight scoliosis
convexity to the left and minimal degenerative spondylosis.
IMPRESSION: Stable moderate enlargement of the cardiac silhouette.

No pneumoperitoneum is evident.

Fecal distention of portions of the colon.

There are some loops of small intestine which are dilated slightly.
There is a possible sentinel loops seen on the erect examination on
the left of L3.  This appearance could be seen with early partial
small-bowel obstruction or gastroenteritis.  If the bowel
distention should persist or if the patient develops vomiting, CT
of the abdomen and pelvis may be considered for additional
evaluation.

## 2012-05-11 NOTE — ED Provider Notes (Signed)
Medical screening examination/treatment/procedure(s) were performed by non-physician practitioner and as supervising physician I was immediately available for consultation/collaboration.   Marquasha Brutus, MD 05/11/12 1952 
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
## 2012-05-15 ENCOUNTER — Encounter (HOSPITAL_COMMUNITY): Payer: Self-pay | Admitting: *Deleted

## 2012-05-15 ENCOUNTER — Emergency Department (HOSPITAL_COMMUNITY)
Admission: EM | Admit: 2012-05-15 | Discharge: 2012-05-15 | Disposition: A | Discharge status: Home / Self Care | Payer: Medicare Other | Attending: Emergency Medicine | Admitting: Emergency Medicine

## 2012-05-15 DIAGNOSIS — F209 Schizophrenia, unspecified: Secondary | ICD-10-CM | POA: Insufficient documentation

## 2012-05-15 DIAGNOSIS — J449 Chronic obstructive pulmonary disease, unspecified: Secondary | ICD-10-CM | POA: Insufficient documentation

## 2012-05-15 DIAGNOSIS — I120 Hypertensive chronic kidney disease with stage 5 chronic kidney disease or end stage renal disease: Secondary | ICD-10-CM | POA: Insufficient documentation

## 2012-05-15 DIAGNOSIS — G894 Chronic pain syndrome: Secondary | ICD-10-CM | POA: Insufficient documentation

## 2012-05-15 DIAGNOSIS — G43009 Migraine without aura, not intractable, without status migrainosus: Secondary | ICD-10-CM

## 2012-05-15 DIAGNOSIS — Z992 Dependence on renal dialysis: Secondary | ICD-10-CM | POA: Insufficient documentation

## 2012-05-15 DIAGNOSIS — F17200 Nicotine dependence, unspecified, uncomplicated: Secondary | ICD-10-CM | POA: Insufficient documentation

## 2012-05-15 DIAGNOSIS — J4489 Other specified chronic obstructive pulmonary disease: Secondary | ICD-10-CM | POA: Insufficient documentation

## 2012-05-15 DIAGNOSIS — G43909 Migraine, unspecified, not intractable, without status migrainosus: Secondary | ICD-10-CM | POA: Insufficient documentation

## 2012-05-15 DIAGNOSIS — N186 End stage renal disease: Secondary | ICD-10-CM | POA: Insufficient documentation

## 2012-05-15 DIAGNOSIS — D649 Anemia, unspecified: Secondary | ICD-10-CM | POA: Insufficient documentation

## 2012-05-15 DIAGNOSIS — F319 Bipolar disorder, unspecified: Secondary | ICD-10-CM | POA: Insufficient documentation

## 2012-05-15 MED ORDER — HYDROMORPHONE HCL PF 1 MG/ML IJ SOLN
1.0000 mg | Freq: Once | INTRAMUSCULAR | Status: AC
  Administered 2012-05-15: 1 mg via INTRAMUSCULAR
  Filled 2012-05-15: qty 1

## 2012-05-15 MED ORDER — PROMETHAZINE HCL 25 MG/ML IJ SOLN
25.0000 mg | Freq: Once | INTRAMUSCULAR | Status: AC
  Administered 2012-05-15: 25 mg via INTRAMUSCULAR
  Filled 2012-05-15: qty 1

## 2012-05-15 NOTE — Discharge Instructions (Signed)
Migraine Headache A migraine headache is an intense, throbbing pain on one or both sides of your head. The exact cause of a migraine headache is not always known. A migraine may be caused when nerves in the brain become irritated and release chemicals that cause swelling within blood vessels, causing pain. Many migraine sufferers have a family history of migraines. Before you get a migraine you may or may not get an aura. An aura is a group of symptoms that can predict the beginning of a migraine. An aura may include:  Visual changes such as:   Flashing lights.   Bright spots or zig-zag lines.   Tunnel vision.   Feelings of numbness.   Trouble talking.   Muscle weakness.  SYMPTOMS  Pain on one or both sides of your head.   Pain that is pulsating or throbbing in nature.   Pain that is severe enough to prevent daily activities.   Pain that is aggravated by any daily physical activity.   Nausea (feeling sick to your stomach), vomiting, or both.   Pain with exposure to bright lights, loud noises, or activity.   General sensitivity to bright lights or loud noises.  MIGRAINE TRIGGERS Examples of triggers of migraine headaches include:   Alcohol.   Smoking.   Stress.   It may be related to menses (male menstruation).   Aged cheeses.   Foods or drinks that contain nitrates, glutamate, aspartame, or tyramine.   Lack of sleep.   Chocolate.   Caffeine.   Hunger.   Medications such as nitroglycerine (used to treat chest pain), birth control pills, estrogen, and some blood pressure medications.  DIAGNOSIS  A migraine headache is often diagnosed based on:  Symptoms.   Physical examination.   A computerized X-ray scan (computed tomography, CT) of your head.  TREATMENT  Medications can help prevent migraines if they are recurrent or should they become recurrent. Your caregiver can help you with a medication or treatment program that will be helpful to you.   Lying  down in a dark, quiet room may be helpful.   Keeping a headache diary may help you find a trend as to what may be triggering your headaches.  SEEK IMMEDIATE MEDICAL CARE IF:   You have confusion, personality changes or seizures.   You have headaches that wake you from sleep.   You have an increased frequency in your headaches.   You have a stiff neck.   You have a loss of vision.   You have muscle weakness.   You start losing your balance or have trouble walking.   You feel faint or pass out.  MAKE SURE YOU:   Understand these instructions.   Will watch your condition.   Will get help right away if you are not doing well or get worse.  Document Released: 12/05/2005 Document Revised: 11/24/2011 Document Reviewed: 07/21/2009 [facility] Patient Information 2012 [place], [place].   Follow up with dr. Janna Arch in 3 days as planned.
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
## 2012-05-15 NOTE — ED Provider Notes (Signed)
Medical screening examination/treatment/procedure(s) were performed by non-physician practitioner and as supervising physician I was immediately available for consultation/collaboration.   Benny Lennert, MD 05/15/12 2103

## 2012-05-15 NOTE — ED Notes (Signed)
Headache , onset this am,  Nausea,No head trauma

## 2012-05-16 ENCOUNTER — Emergency Department (HOSPITAL_COMMUNITY)
Admission: EM | Admit: 2012-05-16 | Discharge: 2012-05-16 | Disposition: A | Discharge status: Home / Self Care | Payer: Medicare Other | Attending: Emergency Medicine | Admitting: Emergency Medicine

## 2012-05-16 ENCOUNTER — Encounter (HOSPITAL_COMMUNITY): Payer: Self-pay | Admitting: *Deleted

## 2012-05-16 DIAGNOSIS — F319 Bipolar disorder, unspecified: Secondary | ICD-10-CM | POA: Insufficient documentation

## 2012-05-16 DIAGNOSIS — Z79899 Other long term (current) drug therapy: Secondary | ICD-10-CM | POA: Insufficient documentation

## 2012-05-16 DIAGNOSIS — J4489 Other specified chronic obstructive pulmonary disease: Secondary | ICD-10-CM | POA: Insufficient documentation

## 2012-05-16 DIAGNOSIS — N186 End stage renal disease: Secondary | ICD-10-CM | POA: Insufficient documentation

## 2012-05-16 DIAGNOSIS — G43909 Migraine, unspecified, not intractable, without status migrainosus: Secondary | ICD-10-CM | POA: Insufficient documentation

## 2012-05-16 DIAGNOSIS — Z992 Dependence on renal dialysis: Secondary | ICD-10-CM | POA: Insufficient documentation

## 2012-05-16 DIAGNOSIS — I120 Hypertensive chronic kidney disease with stage 5 chronic kidney disease or end stage renal disease: Secondary | ICD-10-CM | POA: Insufficient documentation

## 2012-05-16 DIAGNOSIS — Z8659 Personal history of other mental and behavioral disorders: Secondary | ICD-10-CM | POA: Insufficient documentation

## 2012-05-16 DIAGNOSIS — G894 Chronic pain syndrome: Secondary | ICD-10-CM | POA: Insufficient documentation

## 2012-05-16 DIAGNOSIS — I2589 Other forms of chronic ischemic heart disease: Secondary | ICD-10-CM | POA: Insufficient documentation

## 2012-05-16 DIAGNOSIS — F17200 Nicotine dependence, unspecified, uncomplicated: Secondary | ICD-10-CM | POA: Insufficient documentation

## 2012-05-16 DIAGNOSIS — R110 Nausea: Secondary | ICD-10-CM | POA: Insufficient documentation

## 2012-05-16 DIAGNOSIS — J449 Chronic obstructive pulmonary disease, unspecified: Secondary | ICD-10-CM | POA: Insufficient documentation

## 2012-05-16 MED ORDER — PROMETHAZINE HCL 25 MG/ML IJ SOLN
25.0000 mg | Freq: Once | INTRAMUSCULAR | Status: AC
  Administered 2012-05-16: 25 mg via INTRAMUSCULAR
  Filled 2012-05-16: qty 1

## 2012-05-16 MED ORDER — HYDROMORPHONE HCL PF 2 MG/ML IJ SOLN
2.0000 mg | Freq: Once | INTRAMUSCULAR | Status: AC
  Administered 2012-05-16: 2 mg via INTRAMUSCULAR
  Filled 2012-05-16: qty 1

## 2012-05-16 NOTE — ED Notes (Signed)
C/o migraine HA with N/V since 0300 this morning

## 2012-05-16 NOTE — ED Notes (Signed)
Headache began yesterday and treated here, came back at 0300. Vomiting also. No active vomiting noted. NAD.
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
## 2012-05-16 NOTE — Discharge Instructions (Signed)
Take up a message at home as directed. Followup for dialysis tomorrow as scheduled. Return for any new or worse symptoms.

## 2012-05-22 ENCOUNTER — Encounter (HOSPITAL_COMMUNITY): Payer: Self-pay | Admitting: *Deleted

## 2012-05-22 ENCOUNTER — Emergency Department (HOSPITAL_COMMUNITY)
Admission: EM | Admit: 2012-05-22 | Discharge: 2012-05-22 | Disposition: A | Discharge status: Home / Self Care | Payer: Medicare Other | Attending: Emergency Medicine | Admitting: Emergency Medicine

## 2012-05-22 ENCOUNTER — Other Ambulatory Visit: Payer: Self-pay

## 2012-05-22 DIAGNOSIS — D649 Anemia, unspecified: Secondary | ICD-10-CM | POA: Insufficient documentation

## 2012-05-22 DIAGNOSIS — J4489 Other specified chronic obstructive pulmonary disease: Secondary | ICD-10-CM | POA: Insufficient documentation

## 2012-05-22 DIAGNOSIS — F17200 Nicotine dependence, unspecified, uncomplicated: Secondary | ICD-10-CM | POA: Insufficient documentation

## 2012-05-22 DIAGNOSIS — N186 End stage renal disease: Secondary | ICD-10-CM | POA: Insufficient documentation

## 2012-05-22 DIAGNOSIS — F209 Schizophrenia, unspecified: Secondary | ICD-10-CM | POA: Insufficient documentation

## 2012-05-22 DIAGNOSIS — R51 Headache: Secondary | ICD-10-CM | POA: Insufficient documentation

## 2012-05-22 DIAGNOSIS — G894 Chronic pain syndrome: Secondary | ICD-10-CM | POA: Insufficient documentation

## 2012-05-22 DIAGNOSIS — I120 Hypertensive chronic kidney disease with stage 5 chronic kidney disease or end stage renal disease: Secondary | ICD-10-CM | POA: Insufficient documentation

## 2012-05-22 DIAGNOSIS — F319 Bipolar disorder, unspecified: Secondary | ICD-10-CM | POA: Insufficient documentation

## 2012-05-22 DIAGNOSIS — J449 Chronic obstructive pulmonary disease, unspecified: Secondary | ICD-10-CM | POA: Insufficient documentation

## 2012-05-22 DIAGNOSIS — Z992 Dependence on renal dialysis: Secondary | ICD-10-CM | POA: Insufficient documentation

## 2012-05-22 DIAGNOSIS — G43909 Migraine, unspecified, not intractable, without status migrainosus: Secondary | ICD-10-CM

## 2012-05-22 DIAGNOSIS — Z79899 Other long term (current) drug therapy: Secondary | ICD-10-CM | POA: Insufficient documentation

## 2012-05-22 DIAGNOSIS — G8929 Other chronic pain: Secondary | ICD-10-CM

## 2012-05-22 MED ORDER — HYDROMORPHONE HCL PF 1 MG/ML IJ SOLN
1.0000 mg | Freq: Once | INTRAMUSCULAR | Status: AC
  Administered 2012-05-22: 1 mg via INTRAMUSCULAR
  Filled 2012-05-22: qty 1

## 2012-05-22 MED ORDER — PROMETHAZINE HCL 25 MG/ML IJ SOLN
25.0000 mg | Freq: Once | INTRAMUSCULAR | Status: AC
  Administered 2012-05-22: 25 mg via INTRAMUSCULAR
  Filled 2012-05-22: qty 1

## 2012-05-22 NOTE — ED Notes (Signed)
Pt complain of headache. States he was suppose to go to Dr. Delbert Harness this past Friday but his appointment was cancelled. States he has an appointment Monday. States he has been getting dilaudid here for pain
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
## 2012-05-22 NOTE — ED Notes (Signed)
Pt c/o headache, nausea and vomiting since 1 am. Pt states that he went to dialysis today and had his whole treatment.

## 2012-05-22 NOTE — Discharge Instructions (Signed)
RESOURCE GUIDE  Chronic Pain Problems: Contact [facility]  [phone] Patients need to be referred by their primary care doctor.  Insufficient Money for Medicine: Contact United Way:  call "211" or [facility] [phone].  No Primary Care Doctor: - Call [facility]  [phone] - can help you locate a primary care doctor that  accepts your insurance, provides certain services, etc. - Physician Referral Service- 1-800-533-3463  Agencies that provide inexpensive medical care: - [facility]  [phone] - [facility]  [phone] - [facility]  [phone] - [facility]  [phone] - [facility]  [phone] - [facility]  [phone]  Medicaid-accepting [place] Providers: - [facility]- [address]  [phone], Mon-Fri 9am-7pm, Sat 9am-1pm - [facility]- [address], [address]  [phone] - [facility]- [address], [address]  [phone] - [facility]- [address] [place] [address]  [phone] - Ve[facility]- [address], [address], [phone]  Only accepts [place] Access Medicaid patients after they have their name  applied to their card  Self Pay (no insurance) in [place]: - Sickle Cell Patients: Dr Eric Dean, [facility]  [address], [phone] - [facility]- [address]  [phone]       -     [facility]- [address]       -     [facility]- see information above (Speak to Pam H if you do not have insurance)       -  [facility]- [address], [phone]       -  [facility] [place]- [address],  [phone]       -  [facility]- [address] [place] [address], [phone]       -  Dr Osei-Bonsu-  [address], [place], [phone]       -  [facility]- [address], [phone]       -  [facility]- [address] [place] [address], [phone], also [address], [phone]       -    [facility]- [address], [phone], 1st & 3rd Saturday   every month, 10am-1pm  1) Find a Doctor and Pay Out of Pocket Although you won't have to find out who is covered by your insurance plan, it is a good idea to ask around and get recommendations. You will then need to call the office and see if the doctor you have chosen will accept you as a new patient and what types of options they offer for patients who are self-pay. Some doctors offer discounts or will set up payment plans for their patients who do not have insurance, but you will need to ask so you aren't surprised when you get to your appointment.  2) Contact Your Local Health Department Not all health departments have doctors that can see patients for sick visits, but many do, so it is worth a call to see if yours does. If you don't know where your local health department is, you can check in your phone book. The CDC also has a tool to help you locate your state's health department, and many state websites also have   listings of all of their local health departments.  3) Find a Walk-in Clinic If your illness is not likely to be very severe or complicated, you may want to try a walk in clinic. These are popping up all over the country in pharmacies, drugstores, and shopping centers. They're usually staffed by nurse practitioners or physician assistants that have been trained to treat common illnesses and complaints. They're usually fairly quick and inexpensive. However, if you have serious medical issues or chronic medical problems, these are probably not your best option  STD Testing - [place] [facility] [place], [facility], [address], [place], phone [phone] or 1-877-539-9860.  Monday - Friday, call for an appointment. - [place]  [facility] [place], [facility], [address], [place], phone [phone] or 1-877-539-9860.  Monday - Friday, call for an appointment.  Abuse/Neglect: - [place] Child Abuse Hotline (336) 641-3795 - [place] Child Abuse Hotline 800-378-5315 (After Hours)  Emergency Shelter:  [facility] (336) 271-5985  Maternity Homes: - [facility] (336) 275-9566 - [facility] (704) 372-4663  MRSA Hotline #:   832-7006  [place] Resources  [facility] [place]  [employer] [place] [facility] [address]                 [address]         [address]  [place]                                               [place]                              [place] Phone:  [phone]                                  Phone:  [phone]                   Phone:  [phone]  [place] [facility], [phone] - [place] Services - [facility]- 1-888-581-9988       -     [facility] in [place], [address],                                  336-349-4454, Insurance  [place] Child Abuse Hotline (336) 342-1394 or (336) 342-3537 (After Hours)   Behavioral Health Services  Substance Abuse Resources: - [facility]  336-882-2125 - [facility] 336-784-9470 - [facility] 336-285-9073 - [facility] 336-845-3988 - Residential & Outpatient Substance Abuse Program  800-659-3381  Psychological Services: - [facility]  [phone] - [facility]  [phone] - [place] [facility], [address], [place], ACCESS LINE: 1-800-853-5163 or 336-641-4981, Http://www.[facility].com/services/adult.htm  Dental Assistance  If unable to pay or uninsured, contact:  [facility] or [place] [facility] to become qualified for the adult dental  clinic.  Patients with Medicaid:  [facility] [address], [phone], [phone]  If unable   to pay, or uninsured, contact [facility] ([phone]) or [place] [facility] ([phone] in , [phone] in [place]) to become qualified for the adult dental clinic  Other Low-Cost Community Dental Services: - [facility]- [address], [place], [place], [address], [phone], Ext. [address], 2nd and 4th Thursday of the month at 6:30am.  10 clients each day by appointment, can sometimes see walk-in patients if someone does not show for an appointment. - [facility]- [address], [place], [place], [address], [phone] - [facility]- [address], [place], [place], [address], [phone] - [place] [facility]- [phone] - [facility]- [phone] - [facility]- [phone]     Take your usual prescriptions as previously directed.  Call your regular medical doctor today to schedule a follow up appointment this week.  Return to the Emergency Department immediately sooner if worsening.   

## 2012-05-23 ENCOUNTER — Encounter (HOSPITAL_COMMUNITY): Payer: Self-pay | Admitting: Emergency Medicine

## 2012-05-23 ENCOUNTER — Emergency Department (HOSPITAL_COMMUNITY)
Admission: EM | Admit: 2012-05-23 | Discharge: 2012-05-23 | Disposition: A | Discharge status: Home / Self Care | Payer: Medicare Other | Attending: Emergency Medicine | Admitting: Emergency Medicine

## 2012-05-23 ENCOUNTER — Encounter: Payer: Self-pay | Admitting: Internal Medicine

## 2012-05-23 DIAGNOSIS — Z992 Dependence on renal dialysis: Secondary | ICD-10-CM | POA: Insufficient documentation

## 2012-05-23 DIAGNOSIS — G894 Chronic pain syndrome: Secondary | ICD-10-CM | POA: Insufficient documentation

## 2012-05-23 DIAGNOSIS — F319 Bipolar disorder, unspecified: Secondary | ICD-10-CM | POA: Insufficient documentation

## 2012-05-23 DIAGNOSIS — D649 Anemia, unspecified: Secondary | ICD-10-CM | POA: Insufficient documentation

## 2012-05-23 DIAGNOSIS — Z79899 Other long term (current) drug therapy: Secondary | ICD-10-CM | POA: Insufficient documentation

## 2012-05-23 DIAGNOSIS — J449 Chronic obstructive pulmonary disease, unspecified: Secondary | ICD-10-CM | POA: Insufficient documentation

## 2012-05-23 DIAGNOSIS — J4489 Other specified chronic obstructive pulmonary disease: Secondary | ICD-10-CM | POA: Insufficient documentation

## 2012-05-23 DIAGNOSIS — R51 Headache: Secondary | ICD-10-CM | POA: Insufficient documentation

## 2012-05-23 DIAGNOSIS — I120 Hypertensive chronic kidney disease with stage 5 chronic kidney disease or end stage renal disease: Secondary | ICD-10-CM | POA: Insufficient documentation

## 2012-05-23 DIAGNOSIS — F209 Schizophrenia, unspecified: Secondary | ICD-10-CM | POA: Insufficient documentation

## 2012-05-23 DIAGNOSIS — N186 End stage renal disease: Secondary | ICD-10-CM | POA: Insufficient documentation

## 2012-05-23 DIAGNOSIS — F17200 Nicotine dependence, unspecified, uncomplicated: Secondary | ICD-10-CM | POA: Insufficient documentation

## 2012-05-23 MED ORDER — ONDANSETRON 4 MG PO TBDP
4.0000 mg | ORAL_TABLET | Freq: Once | ORAL | Status: AC
  Administered 2012-05-23: 4 mg via ORAL
  Filled 2012-05-23: qty 1

## 2012-05-23 MED ORDER — HYDROMORPHONE HCL PF 2 MG/ML IJ SOLN
2.0000 mg | Freq: Once | INTRAMUSCULAR | Status: AC
  Administered 2012-05-23: 2 mg via INTRAVENOUS
  Filled 2012-05-23: qty 1

## 2012-05-23 NOTE — Telephone Encounter (Signed)
i mailed letter to patient to call me to set up OV to further his refills.

## 2012-05-23 NOTE — ED Notes (Addendum)
Patient c/o migraine and neck pain that started this morning at 0800. Patient was treated here yesterday for migraine-per patient medication given yesterday had got rid of migraine. Patient reports nausea but no vomiting.
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
## 2012-05-23 NOTE — ED Provider Notes (Signed)
Medical screening examination/treatment/procedure(s) were performed by non-physician practitioner and as supervising physician I was immediately available for consultation/collaboration.  Aidenn Skellenger, MD 05/23/12 1548 

## 2012-05-23 NOTE — Discharge Instructions (Signed)

## 2012-05-23 NOTE — Telephone Encounter (Signed)
Patient needs office visit prior to further refills 

## 2012-05-23 NOTE — ED Notes (Signed)
Headache, since 8 am. Alert, Nausea, Says pain feels like a migraine.

## 2012-05-23 NOTE — ED Notes (Signed)
Dilaudid was given IM,  Was mistakenly ordered IV.  Confirmed with Neville Route ,PA

## 2012-05-25 IMAGING — CR DG ABDOMEN ACUTE W/ 1V CHEST
3 series · 3 of 3 positions shown · non-contrast
Comparison: 10/29/2010

CLINICAL DATA: Abdominal pain with nausea and vomiting

ACUTE ABDOMEN SERIES (ABDOMEN 2 VIEW & CHEST 1 VIEW)

[view not recorded (1 of 3)]
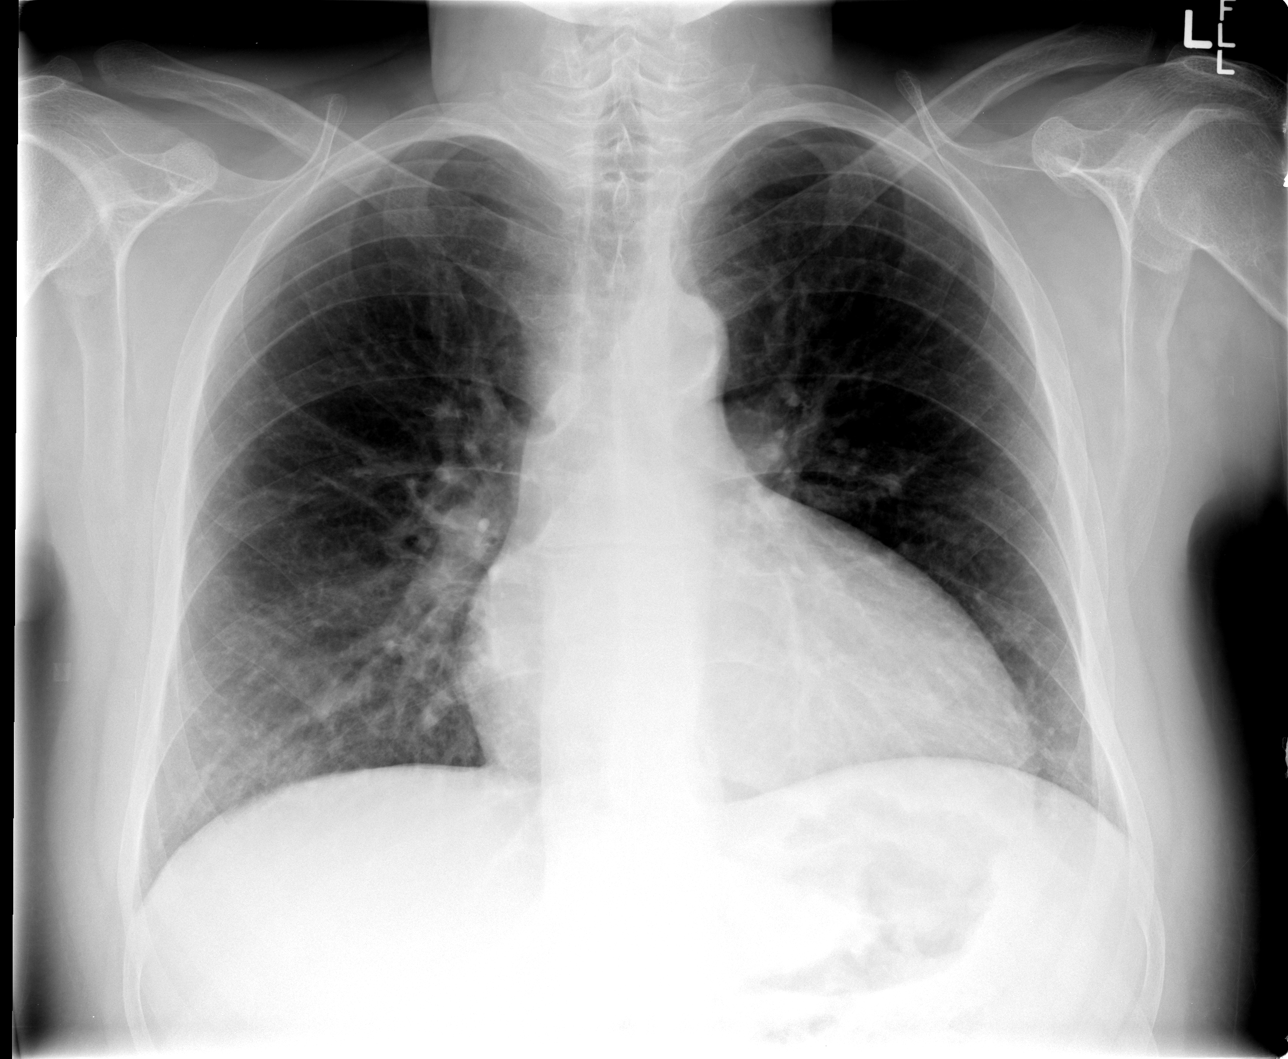

[view not recorded (2 of 3)]
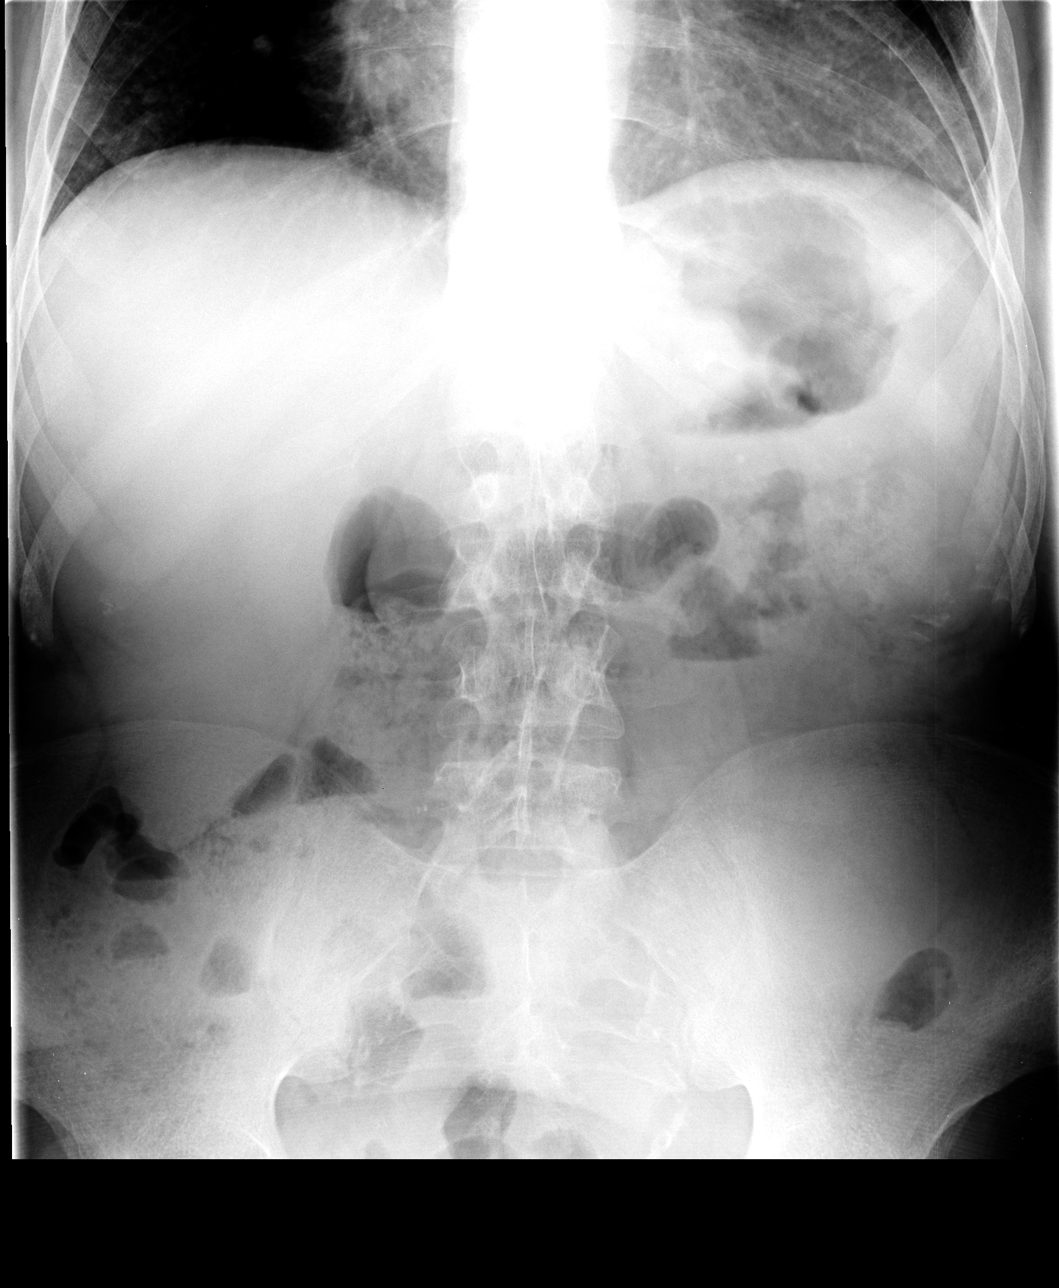

[view not recorded (3 of 3)]
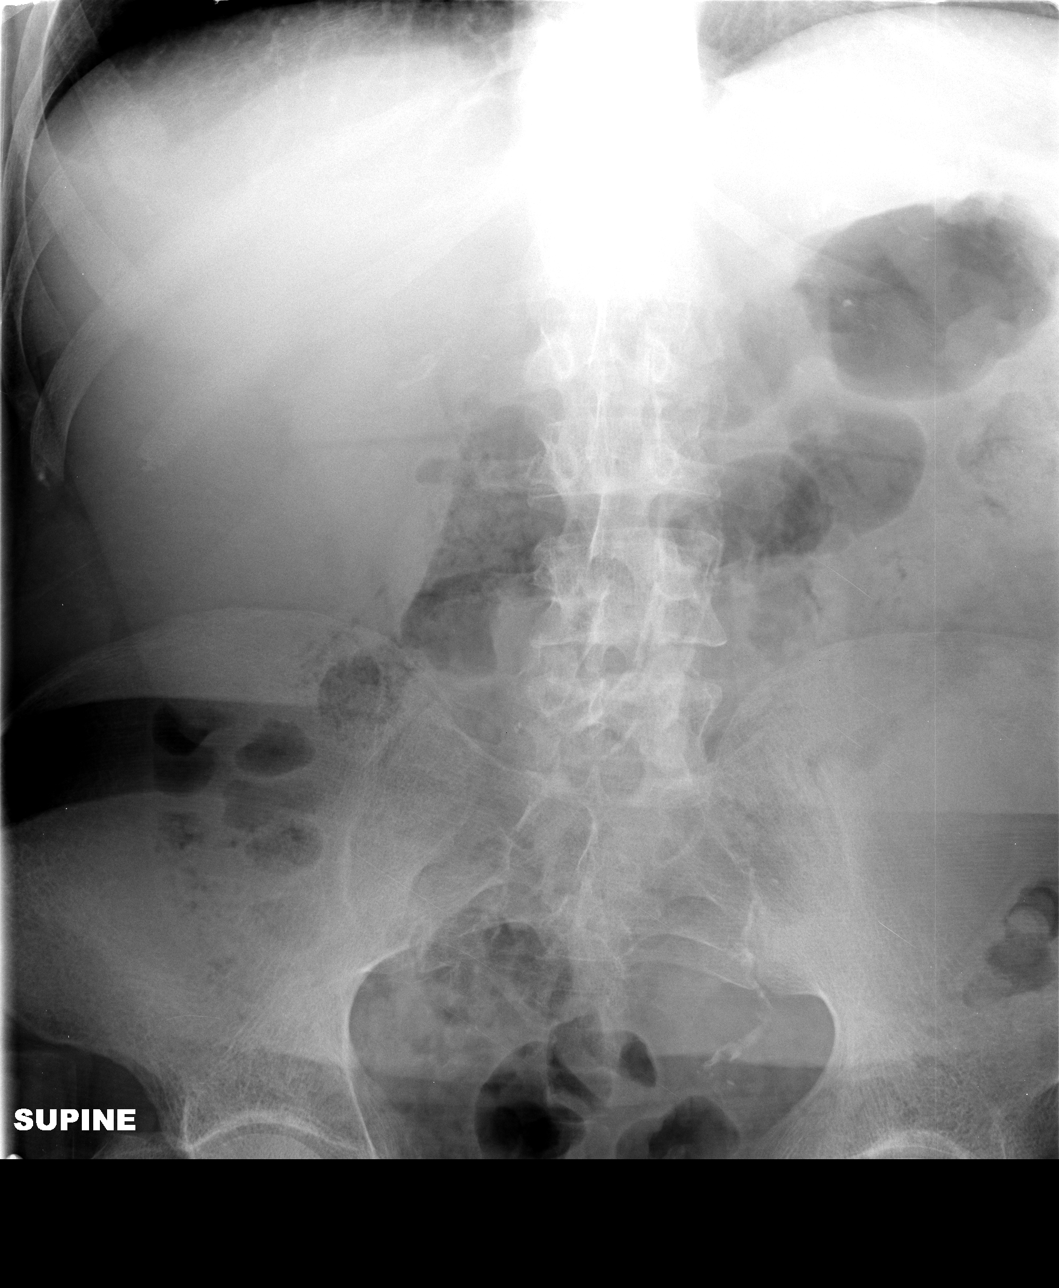

[3 of 3 positions shown; findings below may reference images not displayed]

FINDINGS: Heart mildly enlarged but no current congestive heart
failure.  No free air or acute/specific abnormality of the bowel
gas pattern.  Heavy aortic calcification especially considering
age.    Cannot rule out hepatomegaly. No other acute or specific
abnormality.
IMPRESSION: 1.  Cardiomegaly without frank congestive heart failure or
pneumonia in one-view.
2.  Question hepatomegaly.
3.  Heavy aortic calcification.
4.  No other acute or specific findings.

## 2012-05-26 ENCOUNTER — Emergency Department (HOSPITAL_COMMUNITY)
Admission: EM | Admit: 2012-05-26 | Discharge: 2012-05-26 | Disposition: A | Discharge status: Home / Self Care | Payer: Medicare Other | Attending: Emergency Medicine | Admitting: Emergency Medicine

## 2012-05-26 ENCOUNTER — Encounter (HOSPITAL_COMMUNITY): Payer: Self-pay

## 2012-05-26 DIAGNOSIS — F319 Bipolar disorder, unspecified: Secondary | ICD-10-CM | POA: Insufficient documentation

## 2012-05-26 DIAGNOSIS — G894 Chronic pain syndrome: Secondary | ICD-10-CM | POA: Insufficient documentation

## 2012-05-26 DIAGNOSIS — Z7982 Long term (current) use of aspirin: Secondary | ICD-10-CM | POA: Insufficient documentation

## 2012-05-26 DIAGNOSIS — D649 Anemia, unspecified: Secondary | ICD-10-CM | POA: Insufficient documentation

## 2012-05-26 DIAGNOSIS — Z79899 Other long term (current) drug therapy: Secondary | ICD-10-CM | POA: Insufficient documentation

## 2012-05-26 DIAGNOSIS — N186 End stage renal disease: Secondary | ICD-10-CM | POA: Insufficient documentation

## 2012-05-26 DIAGNOSIS — I120 Hypertensive chronic kidney disease with stage 5 chronic kidney disease or end stage renal disease: Secondary | ICD-10-CM | POA: Insufficient documentation

## 2012-05-26 DIAGNOSIS — F209 Schizophrenia, unspecified: Secondary | ICD-10-CM | POA: Insufficient documentation

## 2012-05-26 DIAGNOSIS — Z992 Dependence on renal dialysis: Secondary | ICD-10-CM | POA: Insufficient documentation

## 2012-05-26 DIAGNOSIS — J4489 Other specified chronic obstructive pulmonary disease: Secondary | ICD-10-CM | POA: Insufficient documentation

## 2012-05-26 DIAGNOSIS — G43909 Migraine, unspecified, not intractable, without status migrainosus: Secondary | ICD-10-CM

## 2012-05-26 DIAGNOSIS — F17200 Nicotine dependence, unspecified, uncomplicated: Secondary | ICD-10-CM | POA: Insufficient documentation

## 2012-05-26 DIAGNOSIS — J449 Chronic obstructive pulmonary disease, unspecified: Secondary | ICD-10-CM | POA: Insufficient documentation

## 2012-05-26 HISTORY — DX: Migraine, unspecified, not intractable, without status migrainosus: G43.909

## 2012-05-26 MED ORDER — ONDANSETRON 8 MG PO TBDP
8.0000 mg | ORAL_TABLET | Freq: Once | ORAL | Status: AC
  Administered 2012-05-26: 8 mg via ORAL
  Filled 2012-05-26: qty 1

## 2012-05-26 MED ORDER — HYDROMORPHONE HCL PF 1 MG/ML IJ SOLN
2.0000 mg | Freq: Once | INTRAMUSCULAR | Status: AC
  Administered 2012-05-26: 2 mg via INTRAMUSCULAR
  Filled 2012-05-26: qty 2

## 2012-05-26 NOTE — Discharge Instructions (Signed)
Migraine Headache  A migraine is very bad pain on one or both sides of your head. The cause of a migraine is not always known. A migraine can be triggered or caused by different things, such as:   Alcohol.   Smoking.   Stress.   Periods (menstruation) in women.   Aged cheeses.   Foods or drinks that contain nitrates, glutamate, aspartame, or tyramine.   Lack of sleep.   Chocolate.   Caffeine.   Hunger.   Medicines, such as nitroglycerine (used to treat chest pain), birth control pills, estrogen, and some blood pressure medicines.  HOME CARE   Many medicines can help migraine pain or keep migraines from coming back. Your doctor can help you decide on a medicine or treatment program.   If you or your child gets a migraine, it may help to lie down in a dark, quiet room.   Keep a headache journal. This may help find out what is causing the headaches. For example, write down:   What you eat and drink.   How much sleep you get.   Any change to your diet or medicines.  GET HELP RIGHT AWAY IF:    The medicine does not work.   The pain begins again.   The neck is stiff.   You have trouble seeing.   The muscles are weak or you lose muscle control.   You have new symptoms.   You lose your balance.   You have trouble walking.   You feel faint or pass out.  MAKE SURE YOU:    Understand these instructions.   Will watch this condition.   Will get help right away if you are not doing well or get worse.  Document Released: 09/13/2008 Document Revised: 11/24/2011 Document Reviewed: 08/10/2009  ExitCare Patient Information 2012 [employer].

## 2012-05-26 NOTE — ED Notes (Signed)
Complain of headache

## 2012-05-28 ENCOUNTER — Encounter (HOSPITAL_COMMUNITY): Payer: Self-pay | Admitting: *Deleted

## 2012-05-28 ENCOUNTER — Emergency Department (HOSPITAL_COMMUNITY)
Admission: EM | Admit: 2012-05-28 | Discharge: 2012-05-28 | Disposition: A | Discharge status: Home / Self Care | Payer: Medicare Other | Attending: Emergency Medicine | Admitting: Emergency Medicine

## 2012-05-28 DIAGNOSIS — G894 Chronic pain syndrome: Secondary | ICD-10-CM | POA: Insufficient documentation

## 2012-05-28 DIAGNOSIS — N186 End stage renal disease: Secondary | ICD-10-CM | POA: Insufficient documentation

## 2012-05-28 DIAGNOSIS — I120 Hypertensive chronic kidney disease with stage 5 chronic kidney disease or end stage renal disease: Secondary | ICD-10-CM | POA: Insufficient documentation

## 2012-05-28 DIAGNOSIS — Z79899 Other long term (current) drug therapy: Secondary | ICD-10-CM | POA: Insufficient documentation

## 2012-05-28 DIAGNOSIS — D649 Anemia, unspecified: Secondary | ICD-10-CM | POA: Insufficient documentation

## 2012-05-28 DIAGNOSIS — J4489 Other specified chronic obstructive pulmonary disease: Secondary | ICD-10-CM | POA: Insufficient documentation

## 2012-05-28 DIAGNOSIS — J449 Chronic obstructive pulmonary disease, unspecified: Secondary | ICD-10-CM | POA: Insufficient documentation

## 2012-05-28 DIAGNOSIS — R51 Headache: Secondary | ICD-10-CM | POA: Insufficient documentation

## 2012-05-28 DIAGNOSIS — F319 Bipolar disorder, unspecified: Secondary | ICD-10-CM | POA: Insufficient documentation

## 2012-05-28 DIAGNOSIS — F209 Schizophrenia, unspecified: Secondary | ICD-10-CM | POA: Insufficient documentation

## 2012-05-28 DIAGNOSIS — Z992 Dependence on renal dialysis: Secondary | ICD-10-CM | POA: Insufficient documentation

## 2012-05-28 DIAGNOSIS — F17200 Nicotine dependence, unspecified, uncomplicated: Secondary | ICD-10-CM | POA: Insufficient documentation

## 2012-05-28 MED ORDER — HYDROMORPHONE HCL PF 1 MG/ML IJ SOLN
1.0000 mg | Freq: Once | INTRAMUSCULAR | Status: AC
  Administered 2012-05-28: 1 mg via INTRAMUSCULAR
  Filled 2012-05-28: qty 1

## 2012-05-28 MED ORDER — PROMETHAZINE HCL 25 MG/ML IJ SOLN
25.0000 mg | Freq: Once | INTRAMUSCULAR | Status: AC
  Administered 2012-05-28: 25 mg via INTRAMUSCULAR
  Filled 2012-05-28: qty 1

## 2012-05-28 NOTE — ED Notes (Addendum)
Headache for 3 weeks, no injury seen here recently for same.  Says he could not see  h is doctor today.

## 2012-05-28 NOTE — Discharge Instructions (Signed)
Headache Headaches are caused by many different problems. Most commonly, headache is caused by muscle tension from an injury, fatigue, or emotional upset. Excessive muscle contractions in the scalp and neck result in a headache that often feels like a tight band around the head. Tension headaches often have areas of tenderness over the scalp and the back of the neck. These headaches may last for hours, days, or longer, and some may contribute to migraines in those who have migraine problems. Migraines usually cause a throbbing headache, which is made worse by activity. Sometimes only one side of the head hurts. Nausea, vomiting, eye pain, and avoidance of food are common with migraines. Visual symptoms such as light sensitivity, blind spots, or flashing lights may also occur. Loud noises may worsen migraine headaches. Many factors may cause migraine headaches:  Emotional stress, lack of sleep, and menstrual periods.   Alcohol and some drugs (such as birth control pills).   Diet factors (fasting, caffeine, food preservatives, chocolate).   Environmental factors (weather changes, bright lights, odors, smoke).  Other causes of headaches include minor injuries to the head. Arthritis in the neck; problems with the jaw, eyes, ears, or nose are also causes of headaches. Allergies, drugs, alcohol, and exposure to smoke can also cause moderate headaches. Rebound headaches can occur if someone uses pain medications for a long period of time and then stops. Less commonly, blood vessel problems in the neck and brain (including stroke) can cause various types of headache. Treatment of headaches includes medicines for pain and relaxation. Ice packs or heat applied to the back of the head and neck help some people. Massaging the shoulders, neck and scalp are often very useful. Relaxation techniques and stretching can help prevent these headaches. Avoid alcohol and cigarette smoking as these tend to make headaches  worse. Please see your caregiver if your headache is not better in 2 days.  SEEK IMMEDIATE MEDICAL CARE IF:   You develop a high fever, chills, or repeated vomiting.   You faint or have difficulty with vision.   You develop unusual numbness or weakness of your arms or legs.   Relief of pain is inadequate with medication, or you develop severe pain.   You develop confusion, or neck stiffness.   You have a worsening of a headache or do not obtain relief.  Document Released: 12/05/2005 Document Revised: 11/24/2011 Document Reviewed: 05/31/2007 Exit[employer] Patient Information 2012 [employer]. 
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
## 2012-05-28 NOTE — ED Notes (Signed)
Alert, talking, oriented.  Headache for 3 weeks.  No head injury.

## 2012-05-29 ENCOUNTER — Encounter (HOSPITAL_COMMUNITY): Payer: Self-pay | Admitting: *Deleted

## 2012-05-29 ENCOUNTER — Emergency Department (HOSPITAL_COMMUNITY)
Admission: EM | Admit: 2012-05-29 | Discharge: 2012-05-29 | Disposition: A | Discharge status: Home / Self Care | Payer: Medicare Other | Attending: Emergency Medicine | Admitting: Emergency Medicine

## 2012-05-29 DIAGNOSIS — J4489 Other specified chronic obstructive pulmonary disease: Secondary | ICD-10-CM | POA: Insufficient documentation

## 2012-05-29 DIAGNOSIS — N186 End stage renal disease: Secondary | ICD-10-CM | POA: Insufficient documentation

## 2012-05-29 DIAGNOSIS — F319 Bipolar disorder, unspecified: Secondary | ICD-10-CM | POA: Insufficient documentation

## 2012-05-29 DIAGNOSIS — R51 Headache: Secondary | ICD-10-CM

## 2012-05-29 DIAGNOSIS — I120 Hypertensive chronic kidney disease with stage 5 chronic kidney disease or end stage renal disease: Secondary | ICD-10-CM | POA: Insufficient documentation

## 2012-05-29 DIAGNOSIS — G894 Chronic pain syndrome: Secondary | ICD-10-CM | POA: Insufficient documentation

## 2012-05-29 DIAGNOSIS — J449 Chronic obstructive pulmonary disease, unspecified: Secondary | ICD-10-CM | POA: Insufficient documentation

## 2012-05-29 DIAGNOSIS — F209 Schizophrenia, unspecified: Secondary | ICD-10-CM | POA: Insufficient documentation

## 2012-05-29 DIAGNOSIS — F17200 Nicotine dependence, unspecified, uncomplicated: Secondary | ICD-10-CM | POA: Insufficient documentation

## 2012-05-29 MED ORDER — PROMETHAZINE HCL 25 MG/ML IJ SOLN
25.0000 mg | Freq: Once | INTRAMUSCULAR | Status: AC
  Administered 2012-05-29: 25 mg via INTRAMUSCULAR
  Filled 2012-05-29: qty 1

## 2012-05-29 NOTE — Discharge Instructions (Signed)
Headaches, Analgesic Rebound °Analgesic agents are prescription or over-the-counter medications used to control pain, including headaches. However, overuse or misuse of theses medications can lead to rebound headaches. Rebound headaches are headaches that recur after the analgesic medication wears off. Eventually, the rebound headaches can become longlasting (chronic). If this happens, you must completely stop using analgesic medications. If not, the chronic headache is likely to continue despite the use of any other treatment. °Usually when you stop taking analgesic medications, the headache may initally get worse for several days. Along with this you may experience sickness in your stomach (nausea), and you may throw up (vomit). After a period of 3 to 5 days, these symptoms begin to improve. Sometimes improvement may take longer. Eventually, the headaches will slowly improve with treatment with the right medications. Most people are able to stop using analgesic medications at home with a caregiver's supervision. But some find it difficult and may require hospitalization. °Document Released: 02/25/2004 Document Revised: 11/24/2011 Document Reviewed: 07/24/2008 °[employer]® Patient Information ©2012 [employer]. °
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
## 2012-05-29 NOTE — ED Notes (Signed)
Pt c/o migraine since changing his medications, pt is suppose to be taking cymbalta 60 mg but prescription is for 30mg  instead, zyprexa is suppose to be twice a day but prescription is written for only one tablet once a day, pt states that pcp made a mistake in writing the prescriptions, pt attempted to "walk in " to pcp office to be seen today but wait was too long, does have appointment for am
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
## 2012-05-31 NOTE — ED Provider Notes (Signed)
Medical screening examination/treatment/procedure(s) were conducted as a shared visit with non-physician practitioner(s) and myself.  I personally evaluated the patient during the encounter.  Patient appears to be his normal self. No neuro deficits  Donnetta Hutching, MD 05/31/12 1524

## 2012-06-01 NOTE — ED Provider Notes (Signed)
Medical screening examination/treatment/procedure(s) were performed by non-physician practitioner and as supervising physician I was immediately available for consultation/collaboration.  Toy Baker, MD 06/01/12 [phone]

## 2012-06-05 ENCOUNTER — Emergency Department (HOSPITAL_COMMUNITY)
Admission: EM | Admit: 2012-06-05 | Discharge: 2012-06-05 | Disposition: A | Discharge status: Home / Self Care | Payer: Medicare Other | Attending: Emergency Medicine | Admitting: Emergency Medicine

## 2012-06-05 ENCOUNTER — Emergency Department (HOSPITAL_COMMUNITY): Payer: Medicare Other

## 2012-06-05 ENCOUNTER — Encounter (HOSPITAL_COMMUNITY): Payer: Self-pay

## 2012-06-05 DIAGNOSIS — W19XXXA Unspecified fall, initial encounter: Secondary | ICD-10-CM | POA: Insufficient documentation

## 2012-06-05 DIAGNOSIS — J4489 Other specified chronic obstructive pulmonary disease: Secondary | ICD-10-CM | POA: Insufficient documentation

## 2012-06-05 DIAGNOSIS — S39012A Strain of muscle, fascia and tendon of lower back, initial encounter: Secondary | ICD-10-CM

## 2012-06-05 DIAGNOSIS — Y92009 Unspecified place in unspecified non-institutional (private) residence as the place of occurrence of the external cause: Secondary | ICD-10-CM | POA: Insufficient documentation

## 2012-06-05 DIAGNOSIS — J449 Chronic obstructive pulmonary disease, unspecified: Secondary | ICD-10-CM | POA: Insufficient documentation

## 2012-06-05 DIAGNOSIS — M545 Low back pain, unspecified: Secondary | ICD-10-CM | POA: Insufficient documentation

## 2012-06-05 DIAGNOSIS — Z992 Dependence on renal dialysis: Secondary | ICD-10-CM | POA: Insufficient documentation

## 2012-06-05 DIAGNOSIS — R51 Headache: Secondary | ICD-10-CM | POA: Insufficient documentation

## 2012-06-05 DIAGNOSIS — S335XXA Sprain of ligaments of lumbar spine, initial encounter: Secondary | ICD-10-CM | POA: Insufficient documentation

## 2012-06-05 DIAGNOSIS — I10 Essential (primary) hypertension: Secondary | ICD-10-CM | POA: Insufficient documentation

## 2012-06-05 DIAGNOSIS — N186 End stage renal disease: Secondary | ICD-10-CM | POA: Insufficient documentation

## 2012-06-05 MED ORDER — HYDROMORPHONE HCL PF 1 MG/ML IJ SOLN
1.0000 mg | Freq: Once | INTRAMUSCULAR | Status: AC
  Administered 2012-06-05: 1 mg via INTRAMUSCULAR
  Filled 2012-06-05: qty 1

## 2012-06-05 NOTE — ED Notes (Signed)
States he tripped over his dog. Complain of low back pain and migraine

## 2012-06-05 NOTE — ED Notes (Signed)
Pt pacing in exam room awaiting md/pa
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
## 2012-06-05 NOTE — Discharge Instructions (Signed)
Back Pain, Adult Low back pain is very common. About 1 in 5 people have back pain.The cause of low back pain is rarely dangerous. The pain often gets better over time.About half of people with a sudden onset of back pain feel better in just 2 weeks. About 8 in 10 people feel better by 6 weeks.  CAUSES Some common causes of back pain include:  Strain of the muscles or ligaments supporting the spine.   Wear and tear (degeneration) of the spinal discs.   Arthritis.   Direct injury to the back.  DIAGNOSIS Most of the time, the direct cause of low back pain is not known.However, back pain can be treated effectively even when the exact cause of the pain is unknown.Answering your caregiver's questions about your overall health and symptoms is one of the most accurate ways to make sure the cause of your pain is not dangerous. If your caregiver needs more information, he or she may order lab work or imaging tests (X-rays or MRIs).However, even if imaging tests show changes in your back, this usually does not require surgery. HOME CARE INSTRUCTIONS For many people, back pain returns.Since low back pain is rarely dangerous, it is often a condition that people can learn to manageon their own.   Remain active. It is stressful on the back to sit or stand in one place. Do not sit, drive, or stand in one place for more than 30 minutes at a time. Take short walks on level surfaces as soon as pain allows.Try to increase the length of time you walk each day.   Do not stay in bed.Resting more than 1 or 2 days can delay your recovery.   Do not avoid exercise or work.Your body is made to move.It is not dangerous to be active, even though your back may hurt.Your back will likely heal faster if you return to being active before your pain is gone.   Pay attention to your body when you bend and lift. Many people have less discomfortwhen lifting if they bend their knees, keep the load close to their  bodies,and avoid twisting. Often, the most comfortable positions are those that put less stress on your recovering back.   Find a comfortable position to sleep. Use a firm mattress and lie on your side with your knees slightly bent. If you lie on your back, put a pillow under your knees.   Only take over-the-counter or prescription medicines as directed by your caregiver. Over-the-counter medicines to reduce pain and inflammation are often the most helpful.Your caregiver may prescribe muscle relaxant drugs.These medicines help dull your pain so you can more quickly return to your normal activities and healthy exercise.   Put ice on the injured area.   Put ice in a plastic bag.   Place a towel between your skin and the bag.   Leave the ice on for 15 to 20 minutes, 3 to 4 times a day for the first 2 to 3 days. After that, ice and heat may be alternated to reduce pain and spasms.   Ask your caregiver about trying back exercises and gentle massage. This may be of some benefit.   Avoid feeling anxious or stressed.Stress increases muscle tension and can worsen back pain.It is important to recognize when you are anxious or stressed and learn ways to manage it.Exercise is a great option.  SEEK MEDICAL CARE IF:  You have pain that is not relieved with rest or medicine.   You have   pain that does not improve in 1 week.   You have new symptoms.   You are generally not feeling well.  SEEK IMMEDIATE MEDICAL CARE IF:   You have pain that radiates from your back into your legs.   You develop new bowel or bladder control problems.   You have unusual weakness or numbness in your arms or legs.   You develop nausea or vomiting.   You develop abdominal pain.   You feel faint.  Document Released: 12/05/2005 Document Revised: 11/24/2011 Document Reviewed: 04/25/2011 [employer] Patient Information 2012 [employer]. 

## 2012-06-06 NOTE — ED Provider Notes (Signed)
Medical screening examination/treatment/procedure(s) were performed by non-physician practitioner and as supervising physician I was immediately available for consultation/collaboration.  Geoffery Lyons, MD 06/06/12 [phone]

## 2012-06-07 ENCOUNTER — Emergency Department (HOSPITAL_COMMUNITY)
Admission: EM | Admit: 2012-06-07 | Discharge: 2012-06-07 | Disposition: A | Discharge status: Home / Self Care | Payer: Medicare Other | Attending: Emergency Medicine | Admitting: Emergency Medicine

## 2012-06-07 ENCOUNTER — Encounter (HOSPITAL_COMMUNITY): Payer: Self-pay

## 2012-06-07 DIAGNOSIS — F319 Bipolar disorder, unspecified: Secondary | ICD-10-CM | POA: Insufficient documentation

## 2012-06-07 DIAGNOSIS — D649 Anemia, unspecified: Secondary | ICD-10-CM | POA: Insufficient documentation

## 2012-06-07 DIAGNOSIS — J4489 Other specified chronic obstructive pulmonary disease: Secondary | ICD-10-CM | POA: Insufficient documentation

## 2012-06-07 DIAGNOSIS — J449 Chronic obstructive pulmonary disease, unspecified: Secondary | ICD-10-CM | POA: Insufficient documentation

## 2012-06-07 DIAGNOSIS — Z992 Dependence on renal dialysis: Secondary | ICD-10-CM | POA: Insufficient documentation

## 2012-06-07 DIAGNOSIS — R51 Headache: Secondary | ICD-10-CM | POA: Insufficient documentation

## 2012-06-07 DIAGNOSIS — G894 Chronic pain syndrome: Secondary | ICD-10-CM | POA: Insufficient documentation

## 2012-06-07 DIAGNOSIS — I120 Hypertensive chronic kidney disease with stage 5 chronic kidney disease or end stage renal disease: Secondary | ICD-10-CM | POA: Insufficient documentation

## 2012-06-07 DIAGNOSIS — M549 Dorsalgia, unspecified: Secondary | ICD-10-CM | POA: Insufficient documentation

## 2012-06-07 DIAGNOSIS — N186 End stage renal disease: Secondary | ICD-10-CM | POA: Insufficient documentation

## 2012-06-07 DIAGNOSIS — G8929 Other chronic pain: Secondary | ICD-10-CM | POA: Insufficient documentation

## 2012-06-07 DIAGNOSIS — F209 Schizophrenia, unspecified: Secondary | ICD-10-CM | POA: Insufficient documentation

## 2012-06-07 DIAGNOSIS — F17200 Nicotine dependence, unspecified, uncomplicated: Secondary | ICD-10-CM | POA: Insufficient documentation

## 2012-06-07 NOTE — ED Notes (Signed)
1. Migraine headache for 1 days, +nausea 2. Back pain
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
## 2012-06-07 NOTE — Discharge Instructions (Signed)
Emergency care providers appreciate that many patients coming to us are in severe pain and we wish to address their pain in the safest, most responsible manner.  It is important to recognize however, that the proper treatment of chronic pain differs from that of the pain of injuries and acute illnesses.  Our goal is to provide quality, safe, personalized care and we thank you for giving us the opportunity to serve you. °The use of narcotics and related agents for chronic pain syndromes may lead to additional physical and psychological problems.  Nearly as many people die from prescription narcotics each year as die from car crashes.  Additionally, this risk is increased if such prescriptions are obtained from a variety of sources.  Therefore, only your primary care physician or a pain management specialist is able to safely treat such syndromes with narcotic medications long-term.   ° °Documentation revealing such prescriptions have been sought from multiple sources may prohibit us from providing a refill or different narcotic medication.  Your name may be checked first through the [place] Controlled Substances Reporting System.  This database is a record of controlled substance medication prescriptions that the patient has received.  This has been established by [employer] in an effort to eliminate the dangerous, and often life threatening, practice of obtaining multiple prescriptions from different medical providers.  ° °If you have a chronic pain syndrome (i.e. chronic headaches, recurrent back or neck pain, dental pain, abdominal or pelvis pain without a specific diagnosis, or neuropathic pain such as fibromyalgia) or recurrent visits for the same condition without an acute diagnosis, you may be treated with non-narcotics and other non-addictive medicines.  Allergic reactions or negative side effects that may be reported by a patient to such medications will not typically lead to the use of a narcotic  analgesic or other controlled substance as an alternative. °  °Patients managing chronic pain with a personal physician should have provisions in place for breakthrough pain.  If you are in crisis, you should call your physician.  If your physician directs you to the emergency department, please have the doctor call and speak to our attending physician concerning your care. °  °When patients come to the Emergency Department (ED) with acute medical conditions in which the Emergency Department physician feels appropriate to prescribe narcotic or sedating pain medication, the physician will prescribe these in very limited quantities.  The amount of these medications will last only until you can see your primary care physician in his/her office.  Any patient who returns to the ED seeking refills should expect only non-narcotic pain medications.  ° °  °Prescriptions for narcotic or sedating medications that have been lost, stolen or expired will not be refilled in the Emergency Department.   ° ° ° ° ° ° °

## 2012-06-09 ENCOUNTER — Emergency Department (HOSPITAL_COMMUNITY)
Admission: EM | Admit: 2012-06-09 | Discharge: 2012-06-09 | Disposition: A | Discharge status: Home / Self Care | Payer: Medicare Other | Attending: Emergency Medicine | Admitting: Emergency Medicine

## 2012-06-09 ENCOUNTER — Encounter (HOSPITAL_COMMUNITY): Payer: Self-pay

## 2012-06-09 DIAGNOSIS — J449 Chronic obstructive pulmonary disease, unspecified: Secondary | ICD-10-CM | POA: Insufficient documentation

## 2012-06-09 DIAGNOSIS — F17200 Nicotine dependence, unspecified, uncomplicated: Secondary | ICD-10-CM | POA: Insufficient documentation

## 2012-06-09 DIAGNOSIS — Z7982 Long term (current) use of aspirin: Secondary | ICD-10-CM | POA: Insufficient documentation

## 2012-06-09 DIAGNOSIS — Z79899 Other long term (current) drug therapy: Secondary | ICD-10-CM | POA: Insufficient documentation

## 2012-06-09 DIAGNOSIS — Z9861 Coronary angioplasty status: Secondary | ICD-10-CM | POA: Insufficient documentation

## 2012-06-09 DIAGNOSIS — I493 Ventricular premature depolarization: Secondary | ICD-10-CM

## 2012-06-09 DIAGNOSIS — J4489 Other specified chronic obstructive pulmonary disease: Secondary | ICD-10-CM | POA: Insufficient documentation

## 2012-06-09 DIAGNOSIS — R51 Headache: Secondary | ICD-10-CM | POA: Insufficient documentation

## 2012-06-09 DIAGNOSIS — Z888 Allergy status to other drugs, medicaments and biological substances status: Secondary | ICD-10-CM | POA: Insufficient documentation

## 2012-06-09 DIAGNOSIS — Z885 Allergy status to narcotic agent status: Secondary | ICD-10-CM | POA: Insufficient documentation

## 2012-06-09 DIAGNOSIS — Z992 Dependence on renal dialysis: Secondary | ICD-10-CM | POA: Insufficient documentation

## 2012-06-09 DIAGNOSIS — I2589 Other forms of chronic ischemic heart disease: Secondary | ICD-10-CM | POA: Insufficient documentation

## 2012-06-09 DIAGNOSIS — Z886 Allergy status to analgesic agent status: Secondary | ICD-10-CM | POA: Insufficient documentation

## 2012-06-09 DIAGNOSIS — I4949 Other premature depolarization: Secondary | ICD-10-CM | POA: Insufficient documentation

## 2012-06-09 DIAGNOSIS — N186 End stage renal disease: Secondary | ICD-10-CM | POA: Insufficient documentation

## 2012-06-09 DIAGNOSIS — I120 Hypertensive chronic kidney disease with stage 5 chronic kidney disease or end stage renal disease: Secondary | ICD-10-CM | POA: Insufficient documentation

## 2012-06-09 NOTE — Discharge Instructions (Signed)
Your EKG reveals some premature beats today. Please see Dr Janna Arch for evaluation of these irregular beats and your recurrent headaches. Cardiac Arrhythmia Your heart is a muscle that works to pump blood through your body by regular contractions. The beating of your heart is controlled by a system of special pacemaker cells. These cells control the electrical activity of the heart. When the system controlling this regular beating is disturbed, a heart rhythm abnormality (arrhythmia) results. WHEN YOUR HEART SKIPS A BEAT One of the most common and least serious heart arrhythmias is called an ectopic or premature atrial heartbeat (PAC). This may be noticed as a small change in your regular pulse. A PAC originates from the top part (atrium) of the heart. Within the right atrium, the SA node is the area that normally controls the regularity of the heart. PACs occur in heart tissue outside of the SA node region. You may feel this as a skipped beat or heart flutter, especially if several occur in succession or occur frequently.  Another arrhythmia is ventricular premature complex (VCP or PVC). These extra beats start out in the bottom, more muscular chambers of the heart. In most cases a PVC is harmless. If there are underlying causes that are making the heart irritable such as an overactive thyroid or a prior heart attack PVCs may be of more concern. In a few cases, medications to control the heart rhythm may be prescribed. Things to try at home:  Cut down or avoid alcohol, tobacco and caffeine.   Get enough sleep.   Reduce stress.   Exercise more.  WHEN THE HEART BEATS TOO FAST Atrial tachycardia is a fast heart rate, which starts out in the atrium. It may last from minutes to much longer. Your heart may beat 140 to 240 times per minute instead of the normal 60 to 100.  Symptoms include a worried feeling (anxiety) and a sense that your heart is beating fast and hard.   You may be able to stop the  fast rate by holding your breath or bearing down as if you were going to have a bowel movement.   This type of fast rate is usually not dangerous.  Atrial fibrillation and atrial flutter are other fast rhythms that start in the atria. Both conditions keep the atria from filling with enough blood so the heart does not work well.  Symptoms include feeling light-headed or faint.   These fast rates may be the result of heart damage or disease. Too much thyroid hormone may play a role.   There may be no clear cause or it may be from heart disease or damage.   Medication or a special electrical treatment (cardioversion) may be needed to get the heart beating normally.  Ventricular tachycardia is a fast heart rate that starts in the lower muscular chambers (ventricles) This is a serious disorder that requires treatment as soon as possible. You need someone else to get and use a small defibrillator.  Symptoms include collapse, chest pain, or being short of breath.   Treatment may include medication, procedures to improve blood flow to the heart, or an implantable cardiac defibrillator (ICD).  DIAGNOSIS   A cardiogram (EKG or ECG) will be done to see the arrhythmia, as well as lab tests to check the underlying cause.   If the extra beats or fast rate come and go, you may wear a Holter monitor that records your heart rate for a longer period of time.  SEEK MEDICAL CARE  IF:  You have irregular or fast heartbeats (palpitations).   You experience skipped beats.   You develop lightheadedness.   You have chest discomfort.   You have shortness of breath.   You have more frequent episodes, if you are already being treated.  SEEK IMMEDIATE MEDICAL CARE IF:   You have severe chest pain, especially if the pain is crushing or pressure-like and spreads to the arms, back, neck, or jaw, or if you have sweating, feeling sick to your stomach (nausea), or shortness of breath. THIS IS AN EMERGENCY. Do not  wait to see if the pain will go away. Get medical help at once. Call 911 or 0 (operator). DO NOT drive yourself to the hospital.   You feel dizzy or faint.   You have episodes of previously documented atrial tachycardia that do not resolve with the techniques your caregiver has taught you.   Irregular or rapid heartbeats begin to occur more often than in the past, especially if they are associated with more pronounced symptoms or of longer duration.  Document Released: 12/05/2005 Document Revised: 11/24/2011 Document Reviewed: 07/23/2008 [facility] Patient Information 2012 [place], [place].Headache Headaches are caused by many different problems. Most commonly, headache is caused by muscle tension from an injury, fatigue, or emotional upset. Excessive muscle contractions in the scalp and neck result in a headache that often feels like a tight band around the head. Tension headaches often have areas of tenderness over the scalp and the back of the neck. These headaches may last for hours, days, or longer, and some may contribute to migraines in those who have migraine problems. Migraines usually cause a throbbing headache, which is made worse by activity. Sometimes only one side of the head hurts. Nausea, vomiting, eye pain, and avoidance of food are common with migraines. Visual symptoms such as light sensitivity, blind spots, or flashing lights may also occur. Loud noises may worsen migraine headaches. Many factors may cause migraine headaches:  Emotional stress, lack of sleep, and menstrual periods.   Alcohol and some drugs (such as birth control pills).   Diet factors (fasting, caffeine, food preservatives, chocolate).   Environmental factors (weather changes, bright lights, odors, smoke).  Other causes of headaches include minor injuries to the head. Arthritis in the neck; problems with the jaw, eyes, ears, or nose are also causes of headaches. Allergies, drugs, alcohol, and exposure to smoke can  also cause moderate headaches. Rebound headaches can occur if someone uses pain medications for a long period of time and then stops. Less commonly, blood vessel problems in the neck and brain (including stroke) can cause various types of headache. Treatment of headaches includes medicines for pain and relaxation. Ice packs or heat applied to the back of the head and neck help some people. Massaging the shoulders, neck and scalp are often very useful. Relaxation techniques and stretching can help prevent these headaches. Avoid alcohol and cigarette smoking as these tend to make headaches worse. Please see your caregiver if your headache is not better in 2 days.  SEEK IMMEDIATE MEDICAL CARE IF:   You develop a high fever, chills, or repeated vomiting.   You faint or have difficulty with vision.   You develop unusual numbness or weakness of your arms or legs.   Relief of pain is inadequate with medication, or you develop severe pain.   You develop confusion, or neck stiffness.   You have a worsening of a headache or do not obtain relief.  Document Released: 12/05/2005  Document Revised: 11/24/2011 Document Reviewed: 05/31/2007 [facility] Patient Information 2012 [place], [place].

## 2012-06-09 NOTE — ED Notes (Signed)
Complain of headache for two days
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
## 2012-06-09 NOTE — ED Notes (Signed)
Pt verbalized understanding of d/c instructions and without further questions 

## 2012-06-10 NOTE — ED Provider Notes (Signed)
Medical screening examination/treatment/procedure(s) were performed by non-physician practitioner and as supervising physician I was immediately available for consultation/collaboration.   Joya Gaskins, MD 06/10/12 [phone]

## 2012-06-11 ENCOUNTER — Encounter (HOSPITAL_COMMUNITY): Payer: Self-pay

## 2012-06-11 ENCOUNTER — Emergency Department (HOSPITAL_COMMUNITY)
Admission: EM | Admit: 2012-06-11 | Discharge: 2012-06-11 | Disposition: A | Discharge status: Home / Self Care | Payer: Medicare Other | Attending: Emergency Medicine | Admitting: Emergency Medicine

## 2012-06-11 DIAGNOSIS — G894 Chronic pain syndrome: Secondary | ICD-10-CM | POA: Insufficient documentation

## 2012-06-11 DIAGNOSIS — N186 End stage renal disease: Secondary | ICD-10-CM | POA: Insufficient documentation

## 2012-06-11 DIAGNOSIS — J4489 Other specified chronic obstructive pulmonary disease: Secondary | ICD-10-CM | POA: Insufficient documentation

## 2012-06-11 DIAGNOSIS — F17200 Nicotine dependence, unspecified, uncomplicated: Secondary | ICD-10-CM | POA: Insufficient documentation

## 2012-06-11 DIAGNOSIS — J449 Chronic obstructive pulmonary disease, unspecified: Secondary | ICD-10-CM | POA: Insufficient documentation

## 2012-06-11 DIAGNOSIS — F209 Schizophrenia, unspecified: Secondary | ICD-10-CM | POA: Insufficient documentation

## 2012-06-11 DIAGNOSIS — I120 Hypertensive chronic kidney disease with stage 5 chronic kidney disease or end stage renal disease: Secondary | ICD-10-CM | POA: Insufficient documentation

## 2012-06-11 DIAGNOSIS — R51 Headache: Secondary | ICD-10-CM | POA: Insufficient documentation

## 2012-06-11 DIAGNOSIS — F319 Bipolar disorder, unspecified: Secondary | ICD-10-CM | POA: Insufficient documentation

## 2012-06-11 MED ORDER — HYDROMORPHONE HCL PF 2 MG/ML IJ SOLN
2.0000 mg | Freq: Once | INTRAMUSCULAR | Status: DC
  Filled 2012-06-11: qty 1

## 2012-06-11 MED ORDER — ONDANSETRON 4 MG PO TBDP
4.0000 mg | ORAL_TABLET | Freq: Once | ORAL | Status: AC
  Administered 2012-06-11: 4 mg via ORAL
  Filled 2012-06-11 (×2): qty 1

## 2012-06-11 MED ORDER — HYDROMORPHONE HCL PF 2 MG/ML IJ SOLN
2.0000 mg | Freq: Once | INTRAMUSCULAR | Status: AC
  Administered 2012-06-11: 2 mg via INTRAMUSCULAR

## 2012-06-11 NOTE — ED Notes (Signed)
Pt c/o migraine headache x 4days with nausea.  Denies vomiting.
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
## 2012-06-11 NOTE — ED Notes (Signed)
Patient with no complaints at this time. Respirations even and unlabored. Skin warm/dry. Discharge instructions reviewed with patient at this time. Patient given opportunity to voice concerns/ask questions. IV removed per policy and band-aid applied to site. Patient discharged at this time and left Emergency Department with steady gait.  

## 2012-06-11 NOTE — Discharge Instructions (Signed)
Follow up with dr. Janna Arch tomorrow as planned.

## 2012-06-12 NOTE — ED Provider Notes (Signed)
Medical screening examination/treatment/procedure(s) were performed by non-physician practitioner and as supervising physician I was immediately available for consultation/collaboration.  [name], MD 06/12/12 [phone]

## 2012-06-12 NOTE — ED Provider Notes (Signed)
Medical screening examination/treatment/procedure(s) were performed by non-physician practitioner and as supervising physician I was immediately available for consultation/collaboration.   Jaquil Todt L Kayon Dozier, MD 06/12/12 0729 

## 2012-06-17 ENCOUNTER — Encounter (HOSPITAL_COMMUNITY): Payer: Self-pay

## 2012-06-17 ENCOUNTER — Emergency Department (HOSPITAL_COMMUNITY)
Admission: EM | Admit: 2012-06-17 | Discharge: 2012-06-17 | Disposition: A | Discharge status: Home / Self Care | Payer: Medicare Other | Attending: Emergency Medicine | Admitting: Emergency Medicine

## 2012-06-17 DIAGNOSIS — X500XXA Overexertion from strenuous movement or load, initial encounter: Secondary | ICD-10-CM | POA: Insufficient documentation

## 2012-06-17 DIAGNOSIS — F17200 Nicotine dependence, unspecified, uncomplicated: Secondary | ICD-10-CM | POA: Insufficient documentation

## 2012-06-17 DIAGNOSIS — S39012A Strain of muscle, fascia and tendon of lower back, initial encounter: Secondary | ICD-10-CM

## 2012-06-17 DIAGNOSIS — J4489 Other specified chronic obstructive pulmonary disease: Secondary | ICD-10-CM | POA: Insufficient documentation

## 2012-06-17 DIAGNOSIS — S335XXA Sprain of ligaments of lumbar spine, initial encounter: Secondary | ICD-10-CM | POA: Insufficient documentation

## 2012-06-17 DIAGNOSIS — F319 Bipolar disorder, unspecified: Secondary | ICD-10-CM | POA: Insufficient documentation

## 2012-06-17 DIAGNOSIS — Y998 Other external cause status: Secondary | ICD-10-CM | POA: Insufficient documentation

## 2012-06-17 DIAGNOSIS — N186 End stage renal disease: Secondary | ICD-10-CM | POA: Insufficient documentation

## 2012-06-17 DIAGNOSIS — F209 Schizophrenia, unspecified: Secondary | ICD-10-CM | POA: Insufficient documentation

## 2012-06-17 DIAGNOSIS — D649 Anemia, unspecified: Secondary | ICD-10-CM | POA: Insufficient documentation

## 2012-06-17 DIAGNOSIS — I120 Hypertensive chronic kidney disease with stage 5 chronic kidney disease or end stage renal disease: Secondary | ICD-10-CM | POA: Insufficient documentation

## 2012-06-17 DIAGNOSIS — J449 Chronic obstructive pulmonary disease, unspecified: Secondary | ICD-10-CM | POA: Insufficient documentation

## 2012-06-17 DIAGNOSIS — Z992 Dependence on renal dialysis: Secondary | ICD-10-CM | POA: Insufficient documentation

## 2012-06-17 DIAGNOSIS — Y9389 Activity, other specified: Secondary | ICD-10-CM | POA: Insufficient documentation

## 2012-06-17 DIAGNOSIS — G894 Chronic pain syndrome: Secondary | ICD-10-CM | POA: Insufficient documentation

## 2012-06-17 MED ORDER — CYCLOBENZAPRINE HCL 10 MG PO TABS: ORAL_TABLET | ORAL | Status: DC

## 2012-06-17 MED ORDER — OXYCODONE-ACETAMINOPHEN 5-325 MG PO TABS
1.0000 | ORAL_TABLET | Freq: Once | ORAL | Status: AC
  Administered 2012-06-17: 1 via ORAL
  Filled 2012-06-17: qty 1

## 2012-06-17 MED ORDER — CYCLOBENZAPRINE HCL 10 MG PO TABS
10.0000 mg | ORAL_TABLET | Freq: Once | ORAL | Status: AC
  Administered 2012-06-17: 10 mg via ORAL
  Filled 2012-06-17: qty 1
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
## 2012-06-17 NOTE — ED Notes (Signed)
Pt was helping a friend move a freezer when his friend dropped his end causing pt to twist his back, pt c/o pain to lower back area.

## 2012-06-17 NOTE — ED Provider Notes (Signed)
Medical screening examination/treatment/procedure(s) were performed by non-physician practitioner and as supervising physician I was immediately available for consultation/collaboration.   Jaiden Dinkins L Ardine Iacovelli, MD 06/17/12 2226 

## 2012-06-17 NOTE — ED Notes (Signed)
Pt was moving a freezer today and now back is hurting

## 2012-06-17 NOTE — Discharge Instructions (Signed)
Back Pain, Adult Low back pain is very common. About 1 in 5 people have back pain.The cause of low back pain is rarely dangerous. The pain often gets better over time.About half of people with a sudden onset of back pain feel better in just 2 weeks. About 8 in 10 people feel better by 6 weeks.  CAUSES Some common causes of back pain include:  Strain of the muscles or ligaments supporting the spine.   Wear and tear (degeneration) of the spinal discs.   Arthritis.   Direct injury to the back.  DIAGNOSIS Most of the time, the direct cause of low back pain is not known.However, back pain can be treated effectively even when the exact cause of the pain is unknown.Answering your caregiver's questions about your overall health and symptoms is one of the most accurate ways to make sure the cause of your pain is not dangerous. If your caregiver needs more information, he or she may order lab work or imaging tests (X-rays or MRIs).However, even if imaging tests show changes in your back, this usually does not require surgery. HOME CARE INSTRUCTIONS For many people, back pain returns.Since low back pain is rarely dangerous, it is often a condition that people can learn to manageon their own.   Remain active. It is stressful on the back to sit or stand in one place. Do not sit, drive, or stand in one place for more than 30 minutes at a time. Take short walks on level surfaces as soon as pain allows.Try to increase the length of time you walk each day.   Do not stay in bed.Resting more than 1 or 2 days can delay your recovery.   Do not avoid exercise or work.Your body is made to move.It is not dangerous to be active, even though your back may hurt.Your back will likely heal faster if you return to being active before your pain is gone.   Pay attention to your body when you bend and lift. Many people have less discomfortwhen lifting if they bend their knees, keep the load close to their  bodies,and avoid twisting. Often, the most comfortable positions are those that put less stress on your recovering back.   Find a comfortable position to sleep. Use a firm mattress and lie on your side with your knees slightly bent. If you lie on your back, put a pillow under your knees.   Only take over-the-counter or prescription medicines as directed by your caregiver. Over-the-counter medicines to reduce pain and inflammation are often the most helpful.Your caregiver may prescribe muscle relaxant drugs.These medicines help dull your pain so you can more quickly return to your normal activities and healthy exercise.   Put ice on the injured area.   Put ice in a plastic bag.   Place a towel between your skin and the bag.   Leave the ice on for 15 to 20 minutes, 3 to 4 times a day for the first 2 to 3 days. After that, ice and heat may be alternated to reduce pain and spasms.   Ask your caregiver about trying back exercises and gentle massage. This may be of some benefit.   Avoid feeling anxious or stressed.Stress increases muscle tension and can worsen back pain.It is important to recognize when you are anxious or stressed and learn ways to manage it.Exercise is a great option.  SEEK MEDICAL CARE IF:  You have pain that is not relieved with rest or medicine.   You have   pain that does not improve in 1 week.   You have new symptoms.   You are generally not feeling well.  SEEK IMMEDIATE MEDICAL CARE IF:   You have pain that radiates from your back into your legs.   You develop new bowel or bladder control problems.   You have unusual weakness or numbness in your arms or legs.   You develop nausea or vomiting.   You develop abdominal pain.   You feel faint.  Document Released: 12/05/2005 Document Revised: 11/24/2011 Document Reviewed: 04/25/2011 [facility] Patient Information 2012 [place], [place].   Take the flexeril as directed.  Take your pain meds as prescribed if  needed.  Apply ice several times daily.  Follow up with your MD as needed.

## 2012-06-19 ENCOUNTER — Encounter (HOSPITAL_COMMUNITY): Payer: Self-pay | Admitting: *Deleted

## 2012-06-19 ENCOUNTER — Emergency Department (HOSPITAL_COMMUNITY)
Admission: EM | Admit: 2012-06-19 | Discharge: 2012-06-19 | Disposition: A | Discharge status: Other Acute Inpatient Hospital | Payer: Medicare Other | Attending: Emergency Medicine | Admitting: Emergency Medicine

## 2012-06-19 DIAGNOSIS — F17200 Nicotine dependence, unspecified, uncomplicated: Secondary | ICD-10-CM | POA: Insufficient documentation

## 2012-06-19 DIAGNOSIS — F319 Bipolar disorder, unspecified: Secondary | ICD-10-CM | POA: Insufficient documentation

## 2012-06-19 DIAGNOSIS — G43909 Migraine, unspecified, not intractable, without status migrainosus: Secondary | ICD-10-CM

## 2012-06-19 DIAGNOSIS — N186 End stage renal disease: Secondary | ICD-10-CM | POA: Insufficient documentation

## 2012-06-19 DIAGNOSIS — J449 Chronic obstructive pulmonary disease, unspecified: Secondary | ICD-10-CM | POA: Insufficient documentation

## 2012-06-19 DIAGNOSIS — Z992 Dependence on renal dialysis: Secondary | ICD-10-CM | POA: Insufficient documentation

## 2012-06-19 DIAGNOSIS — G894 Chronic pain syndrome: Secondary | ICD-10-CM | POA: Insufficient documentation

## 2012-06-19 DIAGNOSIS — F209 Schizophrenia, unspecified: Secondary | ICD-10-CM | POA: Insufficient documentation

## 2012-06-19 DIAGNOSIS — D649 Anemia, unspecified: Secondary | ICD-10-CM | POA: Insufficient documentation

## 2012-06-19 DIAGNOSIS — J4489 Other specified chronic obstructive pulmonary disease: Secondary | ICD-10-CM | POA: Insufficient documentation

## 2012-06-19 DIAGNOSIS — R51 Headache: Secondary | ICD-10-CM | POA: Insufficient documentation

## 2012-06-19 DIAGNOSIS — G8929 Other chronic pain: Secondary | ICD-10-CM

## 2012-06-19 DIAGNOSIS — I120 Hypertensive chronic kidney disease with stage 5 chronic kidney disease or end stage renal disease: Secondary | ICD-10-CM | POA: Insufficient documentation

## 2012-06-19 MED ORDER — PROMETHAZINE HCL 25 MG/ML IJ SOLN
25.0000 mg | Freq: Once | INTRAMUSCULAR | Status: AC
  Administered 2012-06-19: 25 mg via INTRAMUSCULAR
  Filled 2012-06-19: qty 1

## 2012-06-19 MED ORDER — HYDROMORPHONE HCL PF 1 MG/ML IJ SOLN
1.0000 mg | Freq: Once | INTRAMUSCULAR | Status: AC
  Administered 2012-06-19: 1 mg via INTRAMUSCULAR
  Filled 2012-06-19: qty 1
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
## 2012-06-19 NOTE — ED Notes (Signed)
Pt c/o headache since this am. Pt also c/o pain in his lower back. States that it started hurting after moving a freezer a couple days ago.

## 2012-06-20 ENCOUNTER — Emergency Department (HOSPITAL_COMMUNITY)
Admission: EM | Admit: 2012-06-20 | Discharge: 2012-06-20 | Disposition: A | Discharge status: Home / Self Care | Payer: Medicare Other | Attending: Emergency Medicine | Admitting: Emergency Medicine

## 2012-06-20 ENCOUNTER — Encounter (HOSPITAL_COMMUNITY): Payer: Self-pay | Admitting: *Deleted

## 2012-06-20 ENCOUNTER — Emergency Department (HOSPITAL_COMMUNITY)
Admission: EM | Admit: 2012-06-20 | Discharge: 2012-06-20 | Disposition: A | Discharge status: Home / Self Care | Payer: Medicare Other | Source: Home / Self Care | Attending: Emergency Medicine | Admitting: Emergency Medicine

## 2012-06-20 DIAGNOSIS — Z992 Dependence on renal dialysis: Secondary | ICD-10-CM | POA: Insufficient documentation

## 2012-06-20 DIAGNOSIS — Z9861 Coronary angioplasty status: Secondary | ICD-10-CM | POA: Insufficient documentation

## 2012-06-20 DIAGNOSIS — N186 End stage renal disease: Secondary | ICD-10-CM | POA: Insufficient documentation

## 2012-06-20 DIAGNOSIS — Z886 Allergy status to analgesic agent status: Secondary | ICD-10-CM | POA: Insufficient documentation

## 2012-06-20 DIAGNOSIS — I120 Hypertensive chronic kidney disease with stage 5 chronic kidney disease or end stage renal disease: Secondary | ICD-10-CM | POA: Insufficient documentation

## 2012-06-20 DIAGNOSIS — Z888 Allergy status to other drugs, medicaments and biological substances status: Secondary | ICD-10-CM | POA: Insufficient documentation

## 2012-06-20 DIAGNOSIS — Z7982 Long term (current) use of aspirin: Secondary | ICD-10-CM | POA: Insufficient documentation

## 2012-06-20 DIAGNOSIS — J4489 Other specified chronic obstructive pulmonary disease: Secondary | ICD-10-CM | POA: Insufficient documentation

## 2012-06-20 DIAGNOSIS — G43909 Migraine, unspecified, not intractable, without status migrainosus: Secondary | ICD-10-CM

## 2012-06-20 DIAGNOSIS — J449 Chronic obstructive pulmonary disease, unspecified: Secondary | ICD-10-CM | POA: Insufficient documentation

## 2012-06-20 DIAGNOSIS — Z79899 Other long term (current) drug therapy: Secondary | ICD-10-CM | POA: Insufficient documentation

## 2012-06-20 DIAGNOSIS — F319 Bipolar disorder, unspecified: Secondary | ICD-10-CM | POA: Insufficient documentation

## 2012-06-20 DIAGNOSIS — F17200 Nicotine dependence, unspecified, uncomplicated: Secondary | ICD-10-CM | POA: Insufficient documentation

## 2012-06-20 DIAGNOSIS — R51 Headache: Secondary | ICD-10-CM | POA: Insufficient documentation

## 2012-06-20 DIAGNOSIS — I2589 Other forms of chronic ischemic heart disease: Secondary | ICD-10-CM | POA: Insufficient documentation

## 2012-06-20 DIAGNOSIS — F209 Schizophrenia, unspecified: Secondary | ICD-10-CM | POA: Insufficient documentation

## 2012-06-20 MED ORDER — DIPHENHYDRAMINE HCL 50 MG/ML IJ SOLN
50.0000 mg | Freq: Once | INTRAMUSCULAR | Status: AC
  Administered 2012-06-20: 50 mg via INTRAMUSCULAR
  Filled 2012-06-20: qty 1

## 2012-06-20 MED ORDER — HYDROMORPHONE HCL PF 1 MG/ML IJ SOLN
1.0000 mg | Freq: Once | INTRAMUSCULAR | Status: AC
  Administered 2012-06-20: 1 mg via INTRAMUSCULAR
  Filled 2012-06-20: qty 1

## 2012-06-20 MED ORDER — PROMETHAZINE HCL 25 MG/ML IJ SOLN
25.0000 mg | Freq: Once | INTRAMUSCULAR | Status: AC
  Administered 2012-06-20: 25 mg via INTRAMUSCULAR
  Filled 2012-06-20: qty 1

## 2012-06-20 MED ORDER — METOCLOPRAMIDE HCL 5 MG/ML IJ SOLN
10.0000 mg | Freq: Once | INTRAMUSCULAR | Status: AC
  Administered 2012-06-20: 10 mg via INTRAMUSCULAR
  Filled 2012-06-20: qty 2
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
## 2012-06-20 NOTE — ED Notes (Signed)
Pt states that the medication he was given earlier has not helped with the headache, pt c/o headache,

## 2012-06-20 NOTE — ED Provider Notes (Signed)
[name], MD  Medical screening examination/treatment/procedure(s) were performed by non-physician practitioner and as supervising physician I was immediately available for consultation/collaboration.  [name], MD 06/20/12 [phone]
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
## 2012-06-20 NOTE — ED Notes (Signed)
C/o headache, no n/v,

## 2012-06-20 NOTE — ED Notes (Signed)
Pt given coke per his request. Update given to pt on plan of care.

## 2012-06-20 NOTE — ED Notes (Signed)
Headache since this am.  Nausea,No vomiting,  No head injury  Alert at triage

## 2012-06-20 NOTE — ED Notes (Signed)
Headache since yesterday,  Seen here this am for same. Says he slept for a little while, then headache was back.

## 2012-06-20 NOTE — ED Provider Notes (Signed)
Medical screening examination/treatment/procedure(s) were performed by non-physician practitioner and as supervising physician I was immediately available for consultation/collaboration. Devoria Albe, MD, Armando Gang   Ward Givens, MD 06/20/12 [phone]

## 2012-06-21 ENCOUNTER — Encounter (HOSPITAL_COMMUNITY): Payer: Self-pay | Admitting: *Deleted

## 2012-06-21 ENCOUNTER — Emergency Department (HOSPITAL_COMMUNITY): Payer: Medicare Other

## 2012-06-21 ENCOUNTER — Emergency Department (HOSPITAL_COMMUNITY)
Admission: EM | Admit: 2012-06-21 | Discharge: 2012-06-21 | Disposition: A | Discharge status: Home / Self Care | Payer: Medicare Other | Attending: Emergency Medicine | Admitting: Emergency Medicine

## 2012-06-21 DIAGNOSIS — Z8659 Personal history of other mental and behavioral disorders: Secondary | ICD-10-CM | POA: Insufficient documentation

## 2012-06-21 DIAGNOSIS — G8929 Other chronic pain: Secondary | ICD-10-CM

## 2012-06-21 DIAGNOSIS — F319 Bipolar disorder, unspecified: Secondary | ICD-10-CM | POA: Insufficient documentation

## 2012-06-21 DIAGNOSIS — Z79899 Other long term (current) drug therapy: Secondary | ICD-10-CM | POA: Insufficient documentation

## 2012-06-21 DIAGNOSIS — Z765 Malingerer [conscious simulation]: Secondary | ICD-10-CM | POA: Insufficient documentation

## 2012-06-21 DIAGNOSIS — J449 Chronic obstructive pulmonary disease, unspecified: Secondary | ICD-10-CM | POA: Insufficient documentation

## 2012-06-21 DIAGNOSIS — I120 Hypertensive chronic kidney disease with stage 5 chronic kidney disease or end stage renal disease: Secondary | ICD-10-CM | POA: Insufficient documentation

## 2012-06-21 DIAGNOSIS — F17200 Nicotine dependence, unspecified, uncomplicated: Secondary | ICD-10-CM | POA: Insufficient documentation

## 2012-06-21 DIAGNOSIS — N186 End stage renal disease: Secondary | ICD-10-CM | POA: Insufficient documentation

## 2012-06-21 DIAGNOSIS — F1910 Other psychoactive substance abuse, uncomplicated: Secondary | ICD-10-CM | POA: Insufficient documentation

## 2012-06-21 DIAGNOSIS — I2589 Other forms of chronic ischemic heart disease: Secondary | ICD-10-CM | POA: Insufficient documentation

## 2012-06-21 DIAGNOSIS — J4489 Other specified chronic obstructive pulmonary disease: Secondary | ICD-10-CM | POA: Insufficient documentation

## 2012-06-21 DIAGNOSIS — R51 Headache: Secondary | ICD-10-CM | POA: Insufficient documentation

## 2012-06-21 DIAGNOSIS — Z992 Dependence on renal dialysis: Secondary | ICD-10-CM | POA: Insufficient documentation

## 2012-06-21 MED ORDER — DIPHENHYDRAMINE HCL 50 MG/ML IJ SOLN
50.0000 mg | Freq: Once | INTRAMUSCULAR | Status: AC
  Administered 2012-06-21: 50 mg via INTRAMUSCULAR
  Filled 2012-06-21: qty 1

## 2012-06-21 MED ORDER — METOCLOPRAMIDE HCL 5 MG/ML IJ SOLN
10.0000 mg | Freq: Once | INTRAMUSCULAR | Status: AC
  Administered 2012-06-21: 10 mg via INTRAMUSCULAR
  Filled 2012-06-21: qty 2

## 2012-06-21 NOTE — ED Notes (Signed)
Pt c/o headache that started three days ago,
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
## 2012-06-22 ENCOUNTER — Emergency Department (HOSPITAL_COMMUNITY)
Admission: EM | Admit: 2012-06-22 | Discharge: 2012-06-22 | Disposition: A | Discharge status: Home / Self Care | Payer: Medicare Other | Attending: Emergency Medicine | Admitting: Emergency Medicine

## 2012-06-22 ENCOUNTER — Encounter (HOSPITAL_COMMUNITY): Payer: Self-pay | Admitting: *Deleted

## 2012-06-22 ENCOUNTER — Emergency Department (HOSPITAL_COMMUNITY): Payer: Medicare Other

## 2012-06-22 DIAGNOSIS — F319 Bipolar disorder, unspecified: Secondary | ICD-10-CM | POA: Insufficient documentation

## 2012-06-22 DIAGNOSIS — R51 Headache: Secondary | ICD-10-CM | POA: Insufficient documentation

## 2012-06-22 DIAGNOSIS — J449 Chronic obstructive pulmonary disease, unspecified: Secondary | ICD-10-CM | POA: Insufficient documentation

## 2012-06-22 DIAGNOSIS — J4489 Other specified chronic obstructive pulmonary disease: Secondary | ICD-10-CM | POA: Insufficient documentation

## 2012-06-22 DIAGNOSIS — N186 End stage renal disease: Secondary | ICD-10-CM | POA: Insufficient documentation

## 2012-06-22 DIAGNOSIS — I120 Hypertensive chronic kidney disease with stage 5 chronic kidney disease or end stage renal disease: Secondary | ICD-10-CM | POA: Insufficient documentation

## 2012-06-22 DIAGNOSIS — Z79899 Other long term (current) drug therapy: Secondary | ICD-10-CM | POA: Insufficient documentation

## 2012-06-22 DIAGNOSIS — Z992 Dependence on renal dialysis: Secondary | ICD-10-CM | POA: Insufficient documentation

## 2012-06-22 DIAGNOSIS — F17200 Nicotine dependence, unspecified, uncomplicated: Secondary | ICD-10-CM | POA: Insufficient documentation

## 2012-06-22 DIAGNOSIS — R109 Unspecified abdominal pain: Secondary | ICD-10-CM

## 2012-06-22 DIAGNOSIS — R1013 Epigastric pain: Secondary | ICD-10-CM | POA: Insufficient documentation

## 2012-06-22 LAB — CBC WITH DIFFERENTIAL/PLATELET
Basophils Absolute: 0 10*3/uL (ref 0.0–0.1)
Basophils Relative: 0 % (ref 0–1)
MCHC: 33.4 g/dL (ref 30.0–36.0)
Neutro Abs: 4 10*3/uL (ref 1.7–7.7)
Neutrophils Relative %: 66 % (ref 43–77)
RDW: 15.5 % (ref 11.5–15.5)
WBC: 6 10*3/uL (ref 4.0–10.5)

## 2012-06-22 LAB — URINALYSIS, ROUTINE W REFLEX MICROSCOPIC
Bilirubin Urine: NEGATIVE
Glucose, UA: 100 mg/dL — AB
Ketones, ur: NEGATIVE mg/dL
Leukocytes, UA: NEGATIVE
pH: 8.5 — ABNORMAL HIGH (ref 5.0–8.0)

## 2012-06-22 LAB — COMPREHENSIVE METABOLIC PANEL
ALT: 11 U/L (ref 0–53)
AST: 17 U/L (ref 0–37)
Albumin: 3.8 g/dL (ref 3.5–5.2)
Alkaline Phosphatase: 140 U/L — ABNORMAL HIGH (ref 39–117)
Chloride: 95 mEq/L — ABNORMAL LOW (ref 96–112)
Potassium: 4.3 mEq/L (ref 3.5–5.1)
Sodium: 134 mEq/L — ABNORMAL LOW (ref 135–145)
Total Bilirubin: 0.5 mg/dL (ref 0.3–1.2)

## 2012-06-22 LAB — URINE MICROSCOPIC-ADD ON

## 2012-06-22 MED ORDER — HYDROMORPHONE HCL PF 1 MG/ML IJ SOLN
1.0000 mg | Freq: Once | INTRAMUSCULAR | Status: AC
  Administered 2012-06-22: 1 mg via INTRAVENOUS
  Filled 2012-06-22: qty 1

## 2012-06-22 MED ORDER — PANTOPRAZOLE SODIUM 40 MG IV SOLR
40.0000 mg | Freq: Once | INTRAVENOUS | Status: AC
  Administered 2012-06-22: 40 mg via INTRAVENOUS
  Filled 2012-06-22: qty 40

## 2012-06-22 MED ORDER — PROMETHAZINE HCL 25 MG/ML IJ SOLN
25.0000 mg | Freq: Once | INTRAMUSCULAR | Status: AC
  Administered 2012-06-22: 25 mg via INTRAVENOUS
  Filled 2012-06-22: qty 1

## 2012-06-22 NOTE — ED Notes (Signed)
RLQ pain that woke pt up this morning.  Denies n/v/d.

## 2012-06-22 NOTE — ED Notes (Signed)
Pt reports right lower quad ab pain that started this am, denies any n/v/d or fever.

## 2012-06-22 NOTE — ED Notes (Signed)
Pt waved staff down , requested meds for pain, pa notified.

## 2012-06-22 NOTE — ED Notes (Signed)
Pt requested morphine for pain, pa notified.
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
## 2012-06-25 ENCOUNTER — Encounter (HOSPITAL_COMMUNITY): Payer: Self-pay | Admitting: *Deleted

## 2012-06-25 ENCOUNTER — Emergency Department (HOSPITAL_COMMUNITY)
Admission: EM | Admit: 2012-06-25 | Discharge: 2012-06-25 | Disposition: A | Discharge status: Home / Self Care | Payer: Medicare Other | Attending: Emergency Medicine | Admitting: Emergency Medicine

## 2012-06-25 DIAGNOSIS — F319 Bipolar disorder, unspecified: Secondary | ICD-10-CM | POA: Insufficient documentation

## 2012-06-25 DIAGNOSIS — F17200 Nicotine dependence, unspecified, uncomplicated: Secondary | ICD-10-CM | POA: Insufficient documentation

## 2012-06-25 DIAGNOSIS — G8929 Other chronic pain: Secondary | ICD-10-CM | POA: Insufficient documentation

## 2012-06-25 DIAGNOSIS — R109 Unspecified abdominal pain: Secondary | ICD-10-CM | POA: Insufficient documentation

## 2012-06-25 DIAGNOSIS — I2510 Atherosclerotic heart disease of native coronary artery without angina pectoris: Secondary | ICD-10-CM | POA: Insufficient documentation

## 2012-06-25 DIAGNOSIS — Z992 Dependence on renal dialysis: Secondary | ICD-10-CM | POA: Insufficient documentation

## 2012-06-25 DIAGNOSIS — I120 Hypertensive chronic kidney disease with stage 5 chronic kidney disease or end stage renal disease: Secondary | ICD-10-CM | POA: Insufficient documentation

## 2012-06-25 DIAGNOSIS — J449 Chronic obstructive pulmonary disease, unspecified: Secondary | ICD-10-CM | POA: Insufficient documentation

## 2012-06-25 DIAGNOSIS — J4489 Other specified chronic obstructive pulmonary disease: Secondary | ICD-10-CM | POA: Insufficient documentation

## 2012-06-25 DIAGNOSIS — N186 End stage renal disease: Secondary | ICD-10-CM | POA: Insufficient documentation

## 2012-06-25 DIAGNOSIS — Z7982 Long term (current) use of aspirin: Secondary | ICD-10-CM | POA: Insufficient documentation

## 2012-06-25 DIAGNOSIS — Z9861 Coronary angioplasty status: Secondary | ICD-10-CM | POA: Insufficient documentation

## 2012-06-25 DIAGNOSIS — R112 Nausea with vomiting, unspecified: Secondary | ICD-10-CM | POA: Insufficient documentation

## 2012-06-25 MED ORDER — PROMETHAZINE HCL 25 MG/ML IJ SOLN
25.0000 mg | Freq: Once | INTRAMUSCULAR | Status: AC
  Administered 2012-06-25: 25 mg via INTRAMUSCULAR
  Filled 2012-06-25: qty 1

## 2012-06-25 MED ORDER — HYDROMORPHONE HCL PF 2 MG/ML IJ SOLN
2.0000 mg | Freq: Once | INTRAMUSCULAR | Status: AC
  Administered 2012-06-25: 2 mg via INTRAMUSCULAR
  Filled 2012-06-25: qty 1
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
## 2012-06-25 NOTE — ED Notes (Signed)
abd pain , vomiting and diarrhea.   

## 2012-06-27 ENCOUNTER — Emergency Department (HOSPITAL_COMMUNITY): Payer: Medicare Other

## 2012-06-27 ENCOUNTER — Encounter (HOSPITAL_COMMUNITY): Payer: Self-pay | Admitting: *Deleted

## 2012-06-27 ENCOUNTER — Emergency Department (HOSPITAL_COMMUNITY)
Admission: EM | Admit: 2012-06-27 | Discharge: 2012-06-27 | Disposition: A | Discharge status: Home / Self Care | Payer: Medicare Other | Attending: Emergency Medicine | Admitting: Emergency Medicine

## 2012-06-27 DIAGNOSIS — F319 Bipolar disorder, unspecified: Secondary | ICD-10-CM | POA: Insufficient documentation

## 2012-06-27 DIAGNOSIS — J449 Chronic obstructive pulmonary disease, unspecified: Secondary | ICD-10-CM | POA: Insufficient documentation

## 2012-06-27 DIAGNOSIS — R1013 Epigastric pain: Secondary | ICD-10-CM | POA: Insufficient documentation

## 2012-06-27 DIAGNOSIS — F209 Schizophrenia, unspecified: Secondary | ICD-10-CM | POA: Insufficient documentation

## 2012-06-27 DIAGNOSIS — I2589 Other forms of chronic ischemic heart disease: Secondary | ICD-10-CM | POA: Insufficient documentation

## 2012-06-27 DIAGNOSIS — R748 Abnormal levels of other serum enzymes: Secondary | ICD-10-CM | POA: Insufficient documentation

## 2012-06-27 DIAGNOSIS — R112 Nausea with vomiting, unspecified: Secondary | ICD-10-CM | POA: Insufficient documentation

## 2012-06-27 DIAGNOSIS — Z992 Dependence on renal dialysis: Secondary | ICD-10-CM | POA: Insufficient documentation

## 2012-06-27 DIAGNOSIS — I120 Hypertensive chronic kidney disease with stage 5 chronic kidney disease or end stage renal disease: Secondary | ICD-10-CM | POA: Insufficient documentation

## 2012-06-27 DIAGNOSIS — F17200 Nicotine dependence, unspecified, uncomplicated: Secondary | ICD-10-CM | POA: Insufficient documentation

## 2012-06-27 DIAGNOSIS — N186 End stage renal disease: Secondary | ICD-10-CM | POA: Insufficient documentation

## 2012-06-27 DIAGNOSIS — G8929 Other chronic pain: Secondary | ICD-10-CM | POA: Insufficient documentation

## 2012-06-27 DIAGNOSIS — Z79899 Other long term (current) drug therapy: Secondary | ICD-10-CM | POA: Insufficient documentation

## 2012-06-27 DIAGNOSIS — J4489 Other specified chronic obstructive pulmonary disease: Secondary | ICD-10-CM | POA: Insufficient documentation

## 2012-06-27 DIAGNOSIS — R1011 Right upper quadrant pain: Secondary | ICD-10-CM | POA: Insufficient documentation

## 2012-06-27 DIAGNOSIS — R51 Headache: Secondary | ICD-10-CM | POA: Insufficient documentation

## 2012-06-27 LAB — POCT I-STAT, CHEM 8
Calcium, Ion: 1.26 mmol/L — ABNORMAL HIGH (ref 1.12–1.23)
Creatinine, Ser: 5.3 mg/dL — ABNORMAL HIGH (ref 0.50–1.35)
Glucose, Bld: 115 mg/dL — ABNORMAL HIGH (ref 70–99)
Hemoglobin: 11.2 g/dL — ABNORMAL LOW (ref 13.0–17.0)
TCO2: 24 mmol/L (ref 0–100)

## 2012-06-27 LAB — URINALYSIS, ROUTINE W REFLEX MICROSCOPIC
Bilirubin Urine: NEGATIVE
Glucose, UA: 100 mg/dL — AB
Ketones, ur: NEGATIVE mg/dL
Leukocytes, UA: NEGATIVE
Nitrite: NEGATIVE
Protein, ur: 100 mg/dL — AB
Specific Gravity, Urine: 1.015 (ref 1.005–1.030)
Urobilinogen, UA: 0.2 mg/dL (ref 0.0–1.0)
pH: 8.5 — ABNORMAL HIGH (ref 5.0–8.0)

## 2012-06-27 LAB — HEPATIC FUNCTION PANEL
ALT: 11 U/L (ref 0–53)
AST: 16 U/L (ref 0–37)
Albumin: 4 g/dL (ref 3.5–5.2)
Alkaline Phosphatase: 151 U/L — ABNORMAL HIGH (ref 39–117)
Bilirubin, Direct: 0.2 mg/dL (ref 0.0–0.3)
Indirect Bilirubin: 0.4 mg/dL (ref 0.3–0.9)
Total Bilirubin: 0.6 mg/dL (ref 0.3–1.2)
Total Protein: 7.4 g/dL (ref 6.0–8.3)

## 2012-06-27 LAB — URINE MICROSCOPIC-ADD ON

## 2012-06-27 MED ORDER — HYDROMORPHONE HCL PF 2 MG/ML IJ SOLN
2.0000 mg | Freq: Once | INTRAMUSCULAR | Status: AC
  Administered 2012-06-27: 2 mg via INTRAMUSCULAR
  Filled 2012-06-27: qty 1

## 2012-06-27 MED ORDER — OXYCODONE-ACETAMINOPHEN 5-325 MG PO TABS
2.0000 | ORAL_TABLET | Freq: Once | ORAL | Status: AC
  Administered 2012-06-27: 2 via ORAL
  Filled 2012-06-27: qty 2

## 2012-06-27 MED ORDER — DIPHENHYDRAMINE HCL 50 MG/ML IJ SOLN
25.0000 mg | Freq: Once | INTRAMUSCULAR | Status: AC
  Administered 2012-06-27: 25 mg via INTRAMUSCULAR
  Filled 2012-06-27: qty 1

## 2012-06-27 MED ORDER — GI COCKTAIL ~~LOC~~
30.0000 mL | Freq: Once | ORAL | Status: AC
  Administered 2012-06-27: 30 mL via ORAL
  Filled 2012-06-27: qty 30

## 2012-06-27 MED ORDER — PROMETHAZINE HCL 25 MG/ML IJ SOLN
25.0000 mg | Freq: Once | INTRAMUSCULAR | Status: AC
  Administered 2012-06-27: 25 mg via INTRAMUSCULAR
  Filled 2012-06-27: qty 1

## 2012-06-27 NOTE — ED Provider Notes (Signed)
Medical screening examination/treatment/procedure(s) were performed by non-physician practitioner and as supervising physician I was immediately available for consultation/collaboration.   Laray Anger, DO 06/27/12 1253

## 2012-06-27 NOTE — ED Notes (Signed)
Pt in [place]
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
## 2012-06-27 NOTE — ED Notes (Signed)
Abd pain since Friday  And migraine..  Vomiting , "a little diarrhea"

## 2012-06-27 NOTE — ED Notes (Addendum)
Pt drinking well. Has not had any vomiting in ED. Requesting pain med, edp aware

## 2012-06-29 ENCOUNTER — Encounter (HOSPITAL_COMMUNITY): Payer: Self-pay

## 2012-06-29 ENCOUNTER — Emergency Department (HOSPITAL_COMMUNITY)
Admission: EM | Admit: 2012-06-29 | Discharge: 2012-06-30 | Disposition: A | Discharge status: Home / Self Care | Payer: Medicare Other | Attending: Emergency Medicine | Admitting: Emergency Medicine

## 2012-06-29 DIAGNOSIS — Z992 Dependence on renal dialysis: Secondary | ICD-10-CM | POA: Insufficient documentation

## 2012-06-29 DIAGNOSIS — I120 Hypertensive chronic kidney disease with stage 5 chronic kidney disease or end stage renal disease: Secondary | ICD-10-CM | POA: Insufficient documentation

## 2012-06-29 DIAGNOSIS — J4489 Other specified chronic obstructive pulmonary disease: Secondary | ICD-10-CM | POA: Insufficient documentation

## 2012-06-29 DIAGNOSIS — G8929 Other chronic pain: Secondary | ICD-10-CM | POA: Insufficient documentation

## 2012-06-29 DIAGNOSIS — I2589 Other forms of chronic ischemic heart disease: Secondary | ICD-10-CM | POA: Insufficient documentation

## 2012-06-29 DIAGNOSIS — F209 Schizophrenia, unspecified: Secondary | ICD-10-CM | POA: Insufficient documentation

## 2012-06-29 DIAGNOSIS — R51 Headache: Secondary | ICD-10-CM | POA: Insufficient documentation

## 2012-06-29 DIAGNOSIS — N186 End stage renal disease: Secondary | ICD-10-CM | POA: Insufficient documentation

## 2012-06-29 DIAGNOSIS — J449 Chronic obstructive pulmonary disease, unspecified: Secondary | ICD-10-CM | POA: Insufficient documentation

## 2012-06-29 DIAGNOSIS — F17200 Nicotine dependence, unspecified, uncomplicated: Secondary | ICD-10-CM | POA: Insufficient documentation

## 2012-06-29 DIAGNOSIS — R109 Unspecified abdominal pain: Secondary | ICD-10-CM | POA: Insufficient documentation

## 2012-06-29 DIAGNOSIS — Z79899 Other long term (current) drug therapy: Secondary | ICD-10-CM | POA: Insufficient documentation

## 2012-06-29 MED ORDER — FAMOTIDINE 20 MG PO TABS
20.0000 mg | ORAL_TABLET | Freq: Once | ORAL | Status: AC
Start: 2012-06-29 — End: 2012-06-29
  Administered 2012-06-29: 20 mg via ORAL
  Filled 2012-06-29: qty 1

## 2012-06-29 MED ORDER — DIPHENHYDRAMINE HCL 50 MG/ML IJ SOLN
50.0000 mg | Freq: Once | INTRAMUSCULAR | Status: AC
  Administered 2012-06-29: 50 mg via INTRAMUSCULAR
  Filled 2012-06-29: qty 1

## 2012-06-29 MED ORDER — METOCLOPRAMIDE HCL 5 MG/ML IJ SOLN
10.0000 mg | Freq: Once | INTRAMUSCULAR | Status: AC
  Administered 2012-06-29: 10 mg via INTRAMUSCULAR
  Filled 2012-06-29: qty 2
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
## 2012-06-29 NOTE — ED Notes (Signed)
Pt c/o migraine headache and abd pain with nausea starting today.  Denies vomiting.

## 2012-06-29 NOTE — ED Notes (Signed)
Pt standing in hallway, wanting to know if he was going to be released & requesting something else for pain. Pt informed that nothing had been ordered at this time & not up for discharge. Told pt I would speak to the EDP.

## 2012-06-30 IMAGING — CR DG CHEST 1V PORT
1 series · 1 of 1 positions shown · non-contrast
Comparison: 11/10/2010

CLINICAL DATA: Short of breath and chest pain

PORTABLE CHEST - 1 VIEW

[view not recorded]
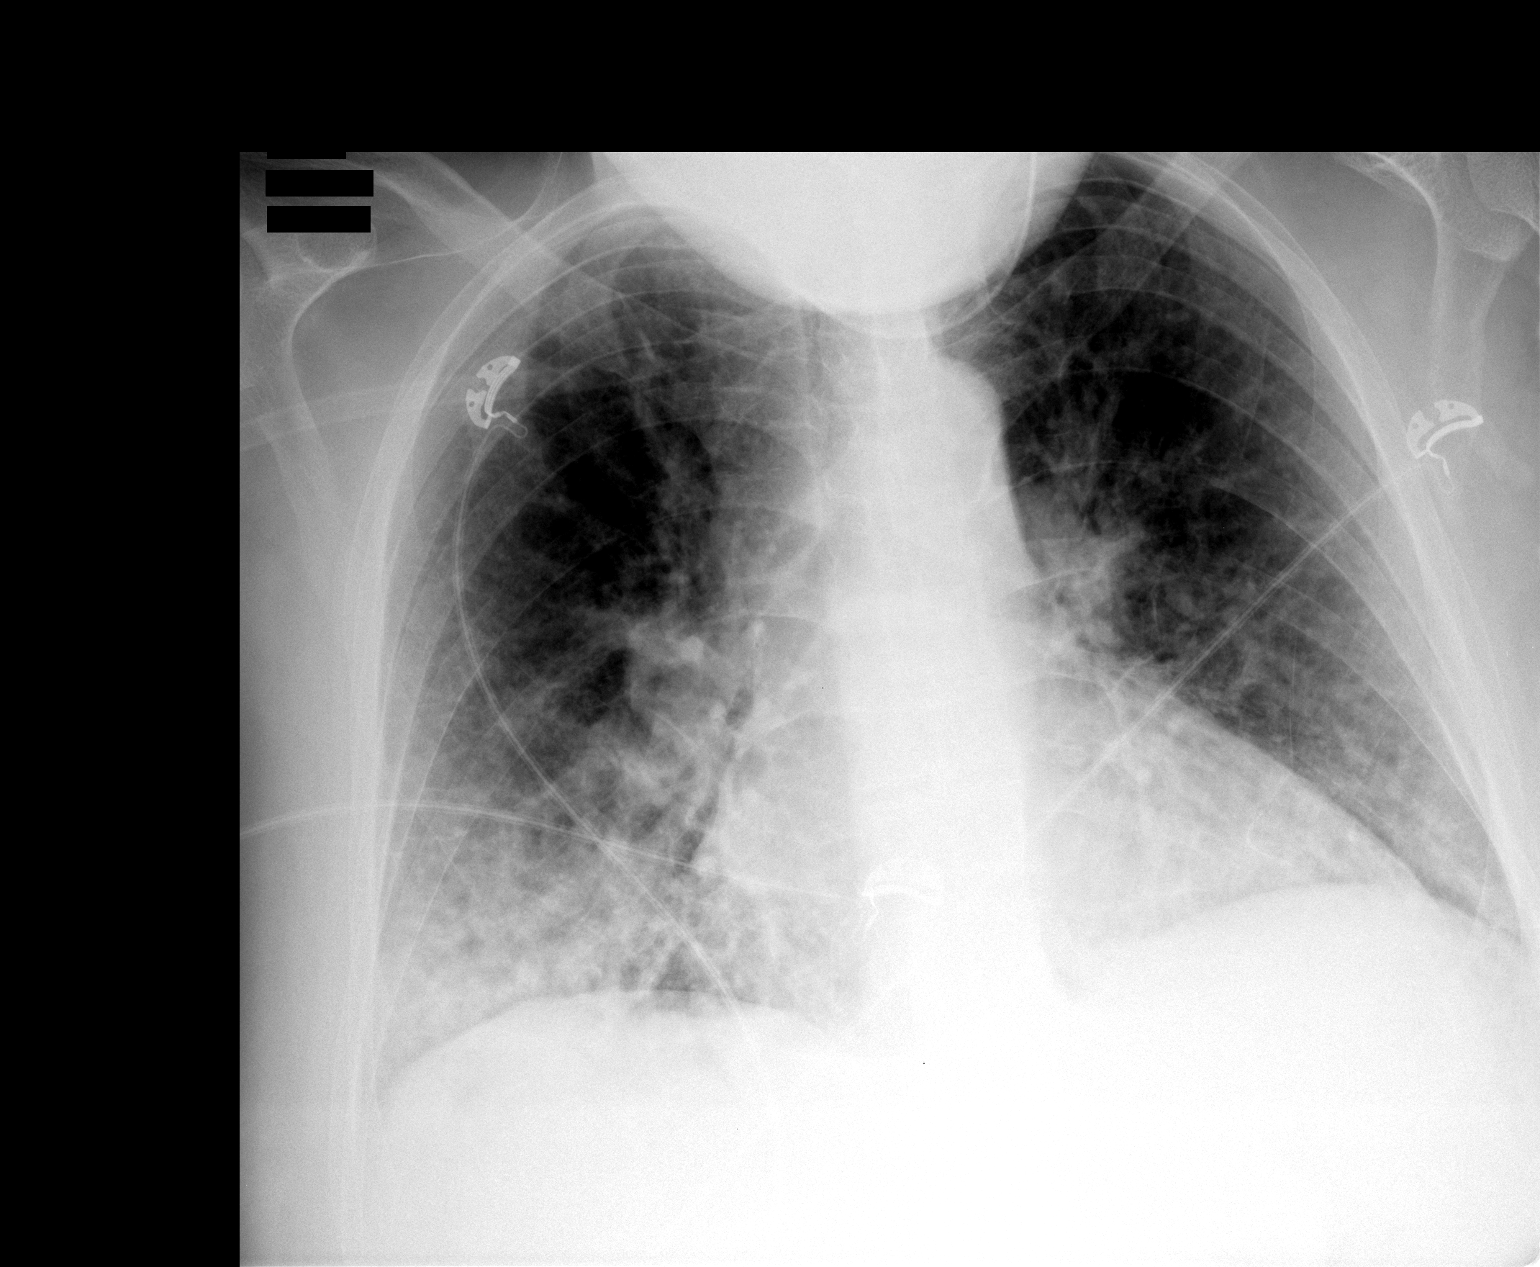

[1 of 1 positions shown; findings below may reference images not displayed]

FINDINGS: Right lower lobe infiltrate, compatible with pneumonia or
edema.  Mild left lower lobe airspace density also present.

Pulmonary vascular congestion and COPD are also noted.
IMPRESSION: Bibasilar airspace density, right greater than left which may be
pneumonia or edema.

Pulmonary vascular congestion and COPD.

## 2012-06-30 NOTE — ED Notes (Signed)
Pt alert & oriented x4, stable gait. Patient given discharge instructions, paperwork & prescription(s). Patient  instructed to stop at the registration desk to finish any additional paperwork. Patient verbalized understanding. Pt left department w/ no further questions. 

## 2012-07-04 ENCOUNTER — Other Ambulatory Visit: Payer: Self-pay

## 2012-07-04 NOTE — Telephone Encounter (Signed)
Looks like last seen 11/2010???? Was on protonix then.  Please find out if we prescribed the Dexilant. If not, he will need OV first.

## 2012-07-05 ENCOUNTER — Emergency Department (HOSPITAL_COMMUNITY)
Admission: EM | Admit: 2012-07-05 | Discharge: 2012-07-05 | Disposition: A | Discharge status: Home / Self Care | Payer: Medicare Other | Attending: Emergency Medicine | Admitting: Emergency Medicine

## 2012-07-05 ENCOUNTER — Encounter (HOSPITAL_COMMUNITY): Payer: Self-pay | Admitting: *Deleted

## 2012-07-05 DIAGNOSIS — J449 Chronic obstructive pulmonary disease, unspecified: Secondary | ICD-10-CM | POA: Insufficient documentation

## 2012-07-05 DIAGNOSIS — J4489 Other specified chronic obstructive pulmonary disease: Secondary | ICD-10-CM | POA: Insufficient documentation

## 2012-07-05 DIAGNOSIS — I120 Hypertensive chronic kidney disease with stage 5 chronic kidney disease or end stage renal disease: Secondary | ICD-10-CM | POA: Insufficient documentation

## 2012-07-05 DIAGNOSIS — F17200 Nicotine dependence, unspecified, uncomplicated: Secondary | ICD-10-CM | POA: Insufficient documentation

## 2012-07-05 DIAGNOSIS — F319 Bipolar disorder, unspecified: Secondary | ICD-10-CM | POA: Insufficient documentation

## 2012-07-05 DIAGNOSIS — F209 Schizophrenia, unspecified: Secondary | ICD-10-CM | POA: Insufficient documentation

## 2012-07-05 DIAGNOSIS — I2589 Other forms of chronic ischemic heart disease: Secondary | ICD-10-CM | POA: Insufficient documentation

## 2012-07-05 DIAGNOSIS — Z79899 Other long term (current) drug therapy: Secondary | ICD-10-CM | POA: Insufficient documentation

## 2012-07-05 DIAGNOSIS — G43909 Migraine, unspecified, not intractable, without status migrainosus: Secondary | ICD-10-CM | POA: Insufficient documentation

## 2012-07-05 DIAGNOSIS — N186 End stage renal disease: Secondary | ICD-10-CM | POA: Insufficient documentation

## 2012-07-05 DIAGNOSIS — Z9861 Coronary angioplasty status: Secondary | ICD-10-CM | POA: Insufficient documentation

## 2012-07-05 DIAGNOSIS — G894 Chronic pain syndrome: Secondary | ICD-10-CM | POA: Insufficient documentation

## 2012-07-05 MED ORDER — HYDROMORPHONE HCL PF 1 MG/ML IJ SOLN
1.0000 mg | Freq: Once | INTRAMUSCULAR | Status: AC
  Administered 2012-07-05: 1 mg via INTRAMUSCULAR
  Filled 2012-07-05: qty 1

## 2012-07-05 MED ORDER — ONDANSETRON 8 MG PO TBDP
8.0000 mg | ORAL_TABLET | Freq: Once | ORAL | Status: AC
  Administered 2012-07-05: 8 mg via ORAL
  Filled 2012-07-05 (×2): qty 1

## 2012-07-05 MED ORDER — HYDROMORPHONE HCL PF 1 MG/ML IJ SOLN
1.0000 mg | Freq: Once | INTRAMUSCULAR | Status: AC
  Administered 2012-07-05: 1 mg via INTRAMUSCULAR
  Filled 2012-07-05 (×2): qty 1

## 2012-07-05 NOTE — ED Notes (Signed)
Headache, onset this am "migraine".  Nausea , no vomiting

## 2012-07-05 NOTE — ED Notes (Signed)
Pt c/o headache since this am with nausea.
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
## 2012-07-06 ENCOUNTER — Emergency Department (HOSPITAL_COMMUNITY)
Admission: EM | Admit: 2012-07-06 | Discharge: 2012-07-06 | Disposition: A | Discharge status: Home / Self Care | Payer: Medicare Other | Attending: Emergency Medicine | Admitting: Emergency Medicine

## 2012-07-06 ENCOUNTER — Encounter (HOSPITAL_COMMUNITY): Payer: Self-pay | Admitting: *Deleted

## 2012-07-06 DIAGNOSIS — G894 Chronic pain syndrome: Secondary | ICD-10-CM | POA: Insufficient documentation

## 2012-07-06 DIAGNOSIS — N186 End stage renal disease: Secondary | ICD-10-CM | POA: Insufficient documentation

## 2012-07-06 DIAGNOSIS — I120 Hypertensive chronic kidney disease with stage 5 chronic kidney disease or end stage renal disease: Secondary | ICD-10-CM | POA: Insufficient documentation

## 2012-07-06 DIAGNOSIS — D649 Anemia, unspecified: Secondary | ICD-10-CM | POA: Insufficient documentation

## 2012-07-06 DIAGNOSIS — J4489 Other specified chronic obstructive pulmonary disease: Secondary | ICD-10-CM | POA: Insufficient documentation

## 2012-07-06 DIAGNOSIS — Z992 Dependence on renal dialysis: Secondary | ICD-10-CM | POA: Insufficient documentation

## 2012-07-06 DIAGNOSIS — J449 Chronic obstructive pulmonary disease, unspecified: Secondary | ICD-10-CM | POA: Insufficient documentation

## 2012-07-06 DIAGNOSIS — F209 Schizophrenia, unspecified: Secondary | ICD-10-CM | POA: Insufficient documentation

## 2012-07-06 DIAGNOSIS — F17200 Nicotine dependence, unspecified, uncomplicated: Secondary | ICD-10-CM | POA: Insufficient documentation

## 2012-07-06 DIAGNOSIS — R51 Headache: Secondary | ICD-10-CM | POA: Insufficient documentation

## 2012-07-06 MED ORDER — DIPHENHYDRAMINE HCL 50 MG/ML IJ SOLN
50.0000 mg | Freq: Once | INTRAMUSCULAR | Status: AC
  Administered 2012-07-06: 50 mg via INTRAMUSCULAR
  Filled 2012-07-06: qty 1

## 2012-07-06 MED ORDER — METOCLOPRAMIDE HCL 5 MG/ML IJ SOLN
10.0000 mg | Freq: Once | INTRAMUSCULAR | Status: AC
  Administered 2012-07-06: 10 mg via INTRAMUSCULAR
  Filled 2012-07-06: qty 2

## 2012-07-06 NOTE — ED Notes (Signed)
Requested to have his discharge papers immediately after receiving meds

## 2012-07-06 NOTE — ED Notes (Signed)
Headache, with nausea

## 2012-07-07 ENCOUNTER — Emergency Department (HOSPITAL_COMMUNITY): Payer: Medicare Other

## 2012-07-07 ENCOUNTER — Encounter (HOSPITAL_COMMUNITY): Payer: Self-pay | Admitting: *Deleted

## 2012-07-07 ENCOUNTER — Emergency Department (HOSPITAL_COMMUNITY)
Admission: EM | Admit: 2012-07-07 | Discharge: 2012-07-07 | Disposition: A | Discharge status: Home / Self Care | Payer: Medicare Other | Attending: Emergency Medicine | Admitting: Emergency Medicine

## 2012-07-07 DIAGNOSIS — I120 Hypertensive chronic kidney disease with stage 5 chronic kidney disease or end stage renal disease: Secondary | ICD-10-CM | POA: Insufficient documentation

## 2012-07-07 DIAGNOSIS — G8929 Other chronic pain: Secondary | ICD-10-CM | POA: Insufficient documentation

## 2012-07-07 DIAGNOSIS — N186 End stage renal disease: Secondary | ICD-10-CM | POA: Insufficient documentation

## 2012-07-07 DIAGNOSIS — F319 Bipolar disorder, unspecified: Secondary | ICD-10-CM | POA: Insufficient documentation

## 2012-07-07 DIAGNOSIS — F17200 Nicotine dependence, unspecified, uncomplicated: Secondary | ICD-10-CM | POA: Insufficient documentation

## 2012-07-07 DIAGNOSIS — J449 Chronic obstructive pulmonary disease, unspecified: Secondary | ICD-10-CM | POA: Insufficient documentation

## 2012-07-07 DIAGNOSIS — R1011 Right upper quadrant pain: Secondary | ICD-10-CM | POA: Insufficient documentation

## 2012-07-07 DIAGNOSIS — R109 Unspecified abdominal pain: Secondary | ICD-10-CM

## 2012-07-07 DIAGNOSIS — J4489 Other specified chronic obstructive pulmonary disease: Secondary | ICD-10-CM | POA: Insufficient documentation

## 2012-07-07 DIAGNOSIS — F209 Schizophrenia, unspecified: Secondary | ICD-10-CM | POA: Insufficient documentation

## 2012-07-07 HISTORY — DX: Other chronic pain: G89.29

## 2012-07-07 HISTORY — DX: Unspecified abdominal pain: R10.9

## 2012-07-07 LAB — CBC
HCT: 29.4 % — ABNORMAL LOW (ref 39.0–52.0)
Hemoglobin: 10.1 g/dL — ABNORMAL LOW (ref 13.0–17.0)
MCH: 31.7 pg (ref 26.0–34.0)
MCHC: 34.4 g/dL (ref 30.0–36.0)
RDW: 15.4 % (ref 11.5–15.5)

## 2012-07-07 LAB — COMPREHENSIVE METABOLIC PANEL
Albumin: 3.6 g/dL (ref 3.5–5.2)
Alkaline Phosphatase: 137 U/L — ABNORMAL HIGH (ref 39–117)
BUN: 8 mg/dL (ref 6–23)
Calcium: 9.7 mg/dL (ref 8.4–10.5)
GFR calc Af Amer: 26 mL/min — ABNORMAL LOW (ref >90)
Glucose, Bld: 116 mg/dL — ABNORMAL HIGH (ref 70–99)
Potassium: 3.3 mEq/L — ABNORMAL LOW (ref 3.5–5.1)
Total Protein: 6.8 g/dL (ref 6.0–8.3)

## 2012-07-07 LAB — LIPASE, BLOOD: Lipase: 32 U/L (ref 11–59)

## 2012-07-07 MED ORDER — GI COCKTAIL ~~LOC~~
30.0000 mL | Freq: Once | ORAL | Status: AC
  Administered 2012-07-07: 30 mL via ORAL
  Filled 2012-07-07: qty 30

## 2012-07-07 MED ORDER — METOCLOPRAMIDE HCL 5 MG/ML IJ SOLN
10.0000 mg | Freq: Once | INTRAMUSCULAR | Status: AC
  Administered 2012-07-07: 10 mg via INTRAMUSCULAR
  Filled 2012-07-07: qty 2

## 2012-07-07 MED ORDER — FAMOTIDINE 20 MG PO TABS
40.0000 mg | ORAL_TABLET | Freq: Once | ORAL | Status: AC
  Administered 2012-07-07: 40 mg via ORAL
  Filled 2012-07-07: qty 2
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
## 2012-07-07 NOTE — ED Notes (Signed)
Abdominal pain began approx. 1100 today after full dialysis treatment.
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
## 2012-07-09 NOTE — ED Provider Notes (Signed)
Medical screening examination/treatment/procedure(s) were performed by non-physician practitioner and as supervising physician I was immediately available for consultation/collaboration.  Caleah Tortorelli, MD 07/09/12 0834 

## 2012-07-10 NOTE — Telephone Encounter (Signed)
The pharmacy said that [name] has been given it to him since Feb 2012

## 2012-07-11 ENCOUNTER — Ambulatory Visit (INDEPENDENT_AMBULATORY_CARE_PROVIDER_SITE_OTHER): Payer: Medicare Other | Admitting: Gastroenterology

## 2012-07-11 ENCOUNTER — Encounter: Payer: Self-pay | Admitting: General Practice

## 2012-07-11 ENCOUNTER — Encounter: Payer: Self-pay | Admitting: Gastroenterology

## 2012-07-11 VITALS — BP 134/81 | HR 77 | Temp 98.5°F | Ht 68.0 in | Wt 195.4 lb

## 2012-07-11 DIAGNOSIS — G8929 Other chronic pain: Secondary | ICD-10-CM

## 2012-07-11 DIAGNOSIS — D649 Anemia, unspecified: Secondary | ICD-10-CM

## 2012-07-11 DIAGNOSIS — R109 Unspecified abdominal pain: Secondary | ICD-10-CM

## 2012-07-11 DIAGNOSIS — R1110 Vomiting, unspecified: Secondary | ICD-10-CM

## 2012-07-11 DIAGNOSIS — K219 Gastro-esophageal reflux disease without esophagitis: Secondary | ICD-10-CM

## 2012-07-11 MED ORDER — ONDANSETRON HCL 4 MG PO TABS: 4.0000 mg | ORAL_TABLET | Freq: Three times a day (TID) | ORAL | Status: AC | PRN

## 2012-07-11 NOTE — Assessment & Plan Note (Signed)
Chronic anemia likely anemia of chronic disease based on prior anemia panel 2 years ago. Hemoglobin has been stable.

## 2012-07-11 NOTE — Addendum Note (Signed)
Addended by: Jennings Books on: 07/11/2012 09:06 AM   Modules accepted: Orders

## 2012-07-11 NOTE — Patient Instructions (Addendum)
Upper endoscopy to be done with Dr. Jena Gauss. See separate instructions.  Zofran sent to your pharmacy for nausea.

## 2012-07-11 NOTE — Progress Notes (Signed)
Faxed to PCP, Dr Dondiego 

## 2012-07-11 NOTE — Telephone Encounter (Signed)
Pt was seen in office today by LSL
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
## 2012-07-11 NOTE — Assessment & Plan Note (Addendum)
Chronic intermittent upper abdominal pain not related to meals or bowel movements. Intermittent nausea and vomiting. Typical reflux symptoms controlled with Dexilant. Consider possibility of ulcer disease. Cannot exclude microlithiasis or pancreatic abnormality given intermittent elevated lipase in the setting of abdominal pain. Difficult to interpret given his end-stage renal disease and noncontrast CTs. As discussed with patient today, offered him an upper endoscopy.  I have discussed the risks, alternatives, benefits with regards to but not limited to the risk of reaction to medication, bleeding, infection, perforation and the patient is agreeable to proceed. Written consent to be obtained. EGD to be done with deep sedation in the OR given his polypharmacy and need for MAC in the past.  If EGD is negative, he may benefit from endoscopic ultrasound. He may also need gastric emptying study but gastroparesis would not explain his abdominal pain.  Zofran provided for nausea vomiting. Further recommendations to follow.

## 2012-07-12 ENCOUNTER — Encounter (HOSPITAL_COMMUNITY): Payer: Self-pay | Admitting: Pharmacy Technician

## 2012-07-13 MED ORDER — DEXLANSOPRAZOLE 60 MG PO CPDR: 60.0000 mg | DELAYED_RELEASE_CAPSULE | Freq: Every day | ORAL | Status: DC

## 2012-07-13 NOTE — Addendum Note (Signed)
Addended by: Tiffany Kocher on: 07/13/2012 12:15 PM   Modules accepted: Orders

## 2012-07-17 ENCOUNTER — Encounter (HOSPITAL_COMMUNITY): Payer: Self-pay | Admitting: Emergency Medicine

## 2012-07-17 ENCOUNTER — Emergency Department (HOSPITAL_COMMUNITY)
Admission: EM | Admit: 2012-07-17 | Discharge: 2012-07-17 | Disposition: A | Discharge status: Home / Self Care | Payer: Medicare Other | Attending: Emergency Medicine | Admitting: Emergency Medicine

## 2012-07-17 DIAGNOSIS — Z992 Dependence on renal dialysis: Secondary | ICD-10-CM | POA: Insufficient documentation

## 2012-07-17 DIAGNOSIS — F17200 Nicotine dependence, unspecified, uncomplicated: Secondary | ICD-10-CM | POA: Insufficient documentation

## 2012-07-17 DIAGNOSIS — F319 Bipolar disorder, unspecified: Secondary | ICD-10-CM | POA: Insufficient documentation

## 2012-07-17 DIAGNOSIS — G894 Chronic pain syndrome: Secondary | ICD-10-CM | POA: Insufficient documentation

## 2012-07-17 DIAGNOSIS — G8929 Other chronic pain: Secondary | ICD-10-CM | POA: Insufficient documentation

## 2012-07-17 DIAGNOSIS — J4489 Other specified chronic obstructive pulmonary disease: Secondary | ICD-10-CM | POA: Insufficient documentation

## 2012-07-17 DIAGNOSIS — N186 End stage renal disease: Secondary | ICD-10-CM | POA: Insufficient documentation

## 2012-07-17 DIAGNOSIS — J449 Chronic obstructive pulmonary disease, unspecified: Secondary | ICD-10-CM | POA: Insufficient documentation

## 2012-07-17 DIAGNOSIS — I120 Hypertensive chronic kidney disease with stage 5 chronic kidney disease or end stage renal disease: Secondary | ICD-10-CM | POA: Insufficient documentation

## 2012-07-17 DIAGNOSIS — F209 Schizophrenia, unspecified: Secondary | ICD-10-CM | POA: Insufficient documentation

## 2012-07-17 DIAGNOSIS — D649 Anemia, unspecified: Secondary | ICD-10-CM | POA: Insufficient documentation

## 2012-07-17 DIAGNOSIS — R51 Headache: Secondary | ICD-10-CM | POA: Insufficient documentation

## 2012-07-17 IMAGING — CR DG CHEST 1V PORT
1 series · 1 of 1 positions shown · non-contrast
Comparison: 12/16/2010 and 10/22/2010.

CLINICAL DATA: Difficulty breathing.  Shortness of breath.

PORTABLE CHEST - 1 VIEW

[view not recorded]
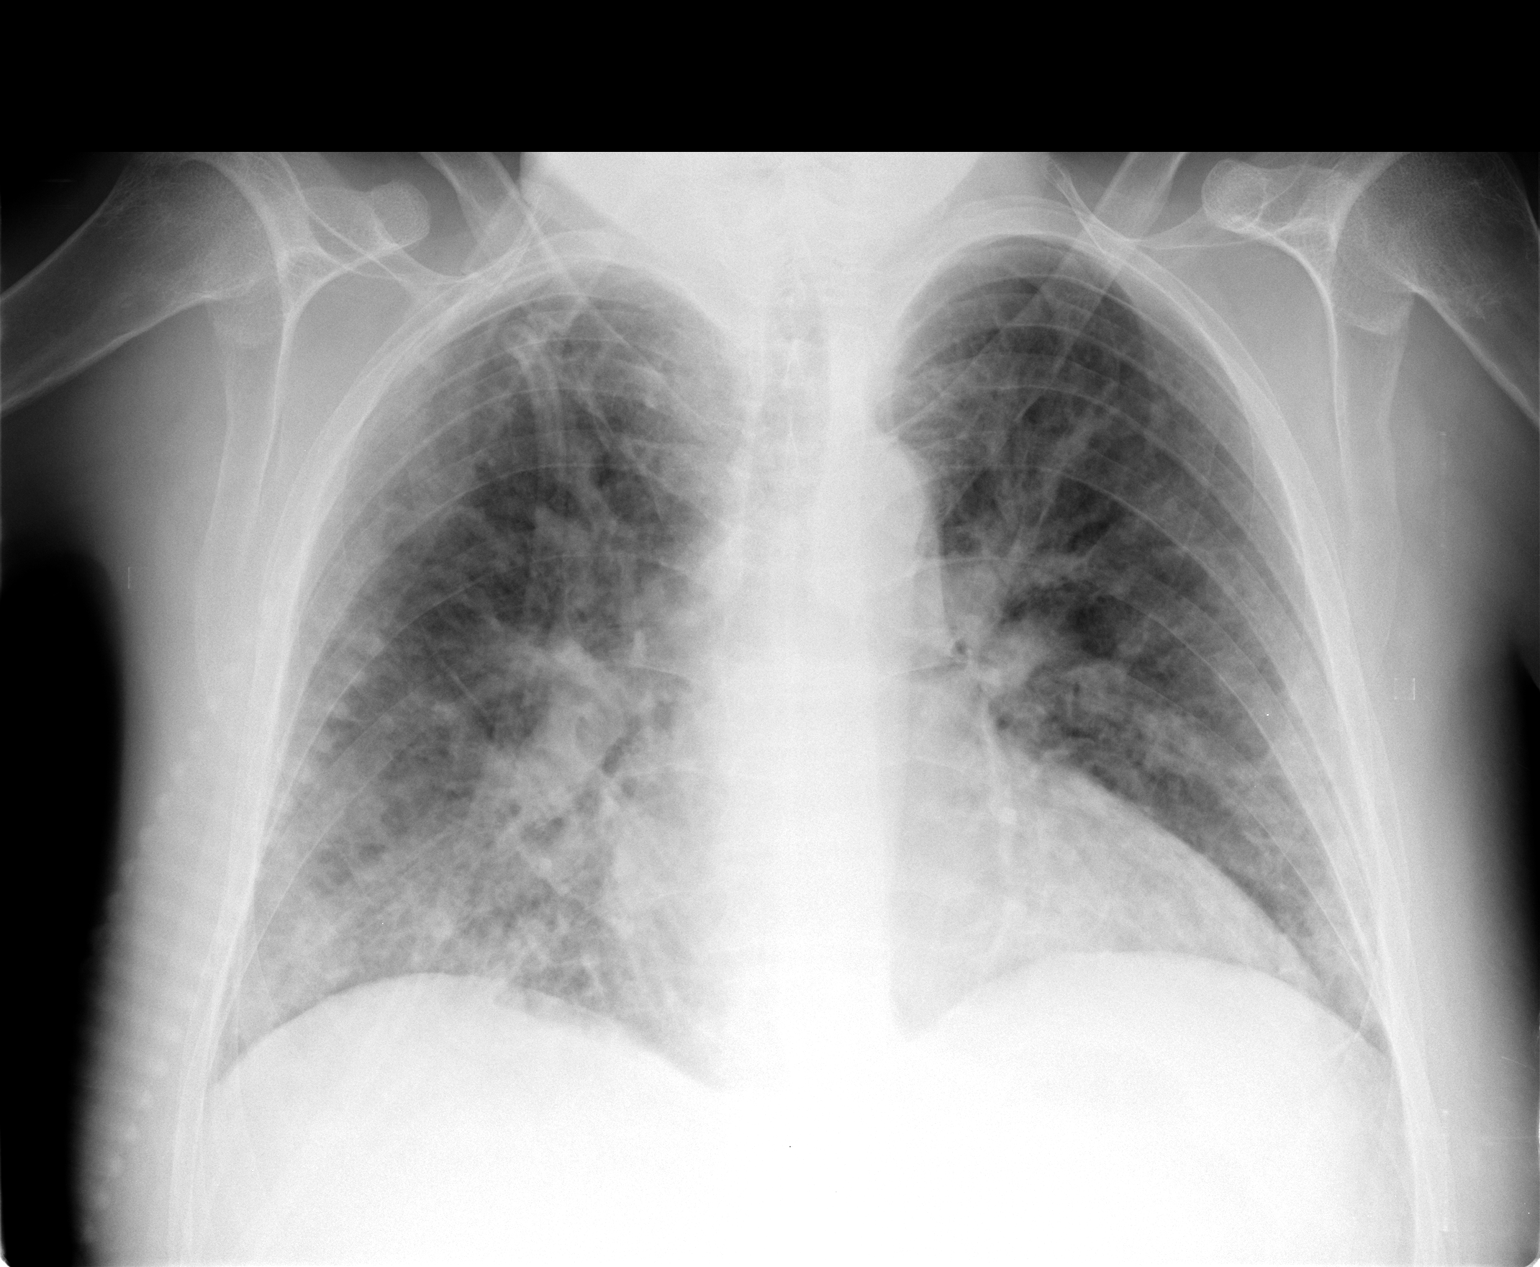

[1 of 1 positions shown; findings below may reference images not displayed]

FINDINGS: 8223 hours.  The heart size and mediastinal contours are
stable with mild cardiac enlargement.  There are diffuse perihilar
pulmonary opacities, similar to the prior examination.  The focal
component at the right lung base has partially cleared.  There is
no significant pleural effusion.
IMPRESSION: Diffuse pulmonary opacities, likely due to congestive heart
failure.  Right basilar air space disease has partially cleared
since most recent study of 2 weeks ago.

## 2012-07-17 NOTE — ED Notes (Signed)
Pt c/o migraine that began around 4am this morning. Pt has hx of migraines and takes Topamax as tx. Pt states he usually gets relief from the Topamax but not today. Pt denies NV.

## 2012-07-17 NOTE — Patient Instructions (Addendum)
20 [name]  07/17/2012   Your procedure is scheduled on: 07/25/12  Report to [facility] at [place] AM.  Call this number if you have problems the morning of surgery: [phone]   Remember:   Do not drink or  eat food:After Midnight.     Take these medicines the morning of surgery with A SIP OF WATER: Norvasc, coreg, catapres, dexilant,lisinopril, and labetalol   Do not wear jewelry, make-up or nail polish.  Do not wear lotions, powders, or perfumes. You may wear deodorant.  Do not shave 48 hours prior to surgery. Men may shave face and neck.  Do not bring valuables to the hospital.  Contacts, dentures or bridgework may not be worn into surgery.  Leave suitcase in the car. After surgery it may be brought to your room.  For patients admitted to the hospital, checkout time is 11:00 AM the day of discharge.   Patients discharged the day of surgery will not be allowed to drive home.       Please read over the following fact sheets that you were given: Pain Booklet, Surgical Site Infection Prevention and Anesthesia Post-op Instructions  Endoscopy Care After Please read the instructions outlined below and refer to this sheet in the next few weeks. These discharge instructions provide you with general information on caring for yourself after you leave the hospital. Your doctor may also give you specific instructions. While your treatment has been planned according to the most current medical practices available, unavoidable complications occasionally occur. If you have any problems or questions after discharge, please call your doctor. HOME CARE INSTRUCTIONS Activity  You may resume your regular activity but move at a slower pace for the next 24 hours.   Take frequent rest periods for the next 24 hours.   Walking will help expel (get rid of) the air and reduce the bloated feeling in your abdomen.   No driving for 24 hours (because of the anesthesia (medicine) used during the test).     You may shower.   Do not sign any important legal documents or operate any machinery for 24 hours (because of the anesthesia used during the test).  Nutrition  Drink plenty of fluids.   You may resume your normal diet.   Begin with a light meal and progress to your normal diet.   Avoid alcoholic beverages for 24 hours or as instructed by your caregiver.  Medications You may resume your normal medications unless your caregiver tells you otherwise. What you can expect today  You may experience abdominal discomfort such as a feeling of fullness or "gas" pains.   You may experience a sore throat for 2 to 3 days. This is normal. Gargling with salt water may help this.  Follow-up Your doctor will discuss the results of your test with you. SEEK IMMEDIATE MEDICAL CARE IF:  You have excessive nausea (feeling sick to your stomach) and/or vomiting.   You have severe abdominal pain and distention (swelling).   You have trouble swallowing.   You have a temperature over 100 F (37.8 C).   You have rectal bleeding or vomiting of blood.  Document Released: 07/19/2004 Document Revised: 11/24/2011 Document Reviewed: 01/30/2008 [facility] Patient Information 2012 [place], [place]. Monitored Anesthesia Care (MAC)  MAC stands for monitored anesthesia care. MAC usually means a tube is not put in your trachea (windpipe). MAC may also be called moderate sedation. MAC usually involves giving intravenous anesthetic drugs, oxygen, watching vital signs and standard patient  monitoring procedures similar to those used during a general anesthetic. MAC can be done without going to the operating room. MAC is typically used for small procedures that cannot be done with only local anesthesia. MAC usually means lower doses of anesthetic drugs. The recovery period tends to be shorter. The drugs used cause a lower level of awareness. This means you are partially awake and your reflexes are intact. You may hear what  is being said and feel some pressure, but should not feel pain. The drugs used may affect your ability to remember the procedure. If you have depressed consciousness and lose some protective reflexes, this is called deep sedation. If you become unconscious and fall completely asleep, this is general anesthesia. In both deep sedation and general anesthesia, the caregivers must make sure that your airway remains open. During MAC, the sedation-trained caregivers will:  Give medications which may include:   Sedatives.   Analgesics.   Hypnotics.   Other medications which are needed to keep you comfortable, safe and secure.   Give local anesthetic to numb the procedural site.   Monitor your level of consciousness.   Monitor your blood pressure.   Monitor your heart rate and rhythm.   Monitor your respirations and oxygen levels.   Monitor your airway.   Monitor your level of pain.   Evaluate and treat problems which may occur.  Document Released: 08/31/2005 Document Revised: 11/24/2011 Document Reviewed: 11/03/2009 [facility] Patient Information 2012 [place], [place].

## 2012-07-17 NOTE — ED Notes (Signed)
Migraine since this am.took topamax and two pain pills without relief.
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
## 2012-07-18 ENCOUNTER — Encounter (HOSPITAL_COMMUNITY)
Admission: RE | Admit: 2012-07-18 | Discharge: 2012-07-18 | Payer: Medicare Other | Source: Ambulatory Visit | Attending: Family Medicine | Admitting: Family Medicine

## 2012-07-19 NOTE — Patient Instructions (Addendum)
20 [name]  07/19/2012   Your procedure is scheduled on:   07/25/2012  Report to [facility] at  615  AM.  Call this number if you have problems the morning of surgery: [phone]   Remember:   Do not eat food:After Midnight.  May have clear liquids:until Midnight .    Take these medicines the morning of surgery with A SIP OF WATER: norvasc,coreg,catapress,dexilant,lisinopril,labetolol   Do not wear jewelry, make-up or nail polish.  Do not wear lotions, powders, or perfumes. You may wear deodorant.  Do not shave 48 hours prior to surgery. Men may shave face and neck.  Do not bring valuables to the hospital.  Contacts, dentures or bridgework may not be worn into surgery.  Leave suitcase in the car. After surgery it may be brought to your room.  For patients admitted to the hospital, checkout time is 11:00 AM the day of discharge.   Patients discharged the day of surgery will not be allowed to drive home.  Name and phone number of your driver: family  Special Instructions: N/A   Please read over the following fact sheets that you were given: Pain Booklet, Surgical Site Infection Prevention, Anesthesia Post-op Instructions and Care and Recovery After Surgery Esophagogastroduodenoscopy This is an endoscopic procedure (a procedure that uses a device like a flexible telescope) that allows your caregiver to view the upper stomach and small bowel. This test allows your caregiver to look at the esophagus. The esophagus carries food from your mouth to your stomach. They can also look at your duodenum. This is the first part of the small intestine that attaches to the stomach. This test is used to detect problems in the bowel such as ulcers and inflammation. PREPARATION FOR TEST Nothing to eat after midnight the day before the test. NORMAL FINDINGS Normal esophagus, stomach, and duodenum. Ranges for normal findings may vary among different laboratories and hospitals. You should always  check with your doctor after having lab work or other tests done to discuss the meaning of your test results and whether your values are considered within normal limits. MEANING OF TEST  Your caregiver will go over the test results with you and discuss the importance and meaning of your results, as well as treatment options and the need for additional tests if necessary. OBTAINING THE TEST RESULTS It is your responsibility to obtain your test results. Ask the lab or department performing the test when and how you will get your results. Document Released: 04/07/2005 Document Revised: 11/24/2011 Document Reviewed: 11/14/2008 [facility] Patient Information 2012 [place], [place].PATIENT INSTRUCTIONS POST-ANESTHESIA  IMMEDIATELY FOLLOWING SURGERY:  Do not drive or operate machinery for the first twenty four hours after surgery.  Do not make any important decisions for twenty four hours after surgery or while taking narcotic pain medications or sedatives.  If you develop intractable nausea and vomiting or a severe headache please notify your doctor immediately.  FOLLOW-UP:  Please make an appointment with your surgeon as instructed. You do not need to follow up with anesthesia unless specifically instructed to do so.  WOUND CARE INSTRUCTIONS (if applicable):  Keep a dry clean dressing on the anesthesia/puncture wound site if there is drainage.  Once the wound has quit draining you may leave it open to air.  Generally you should leave the bandage intact for twenty four hours unless there is drainage.  If the epidural site drains for more than 36-48 hours please call the anesthesia department.  QUESTIONS?:  Please feel free to call your physician or the hospital operator if you have any questions, and they will be happy to assist you.

## 2012-07-20 ENCOUNTER — Encounter (HOSPITAL_COMMUNITY): Admission: RE | Admit: 2012-07-20 | Discharge: 2012-07-20 | Payer: Medicare Other | Source: Ambulatory Visit

## 2012-07-20 ENCOUNTER — Telehealth: Payer: Self-pay | Admitting: Gastroenterology

## 2012-07-20 NOTE — Telephone Encounter (Signed)
Pt called to cancel his procedure on Wednesday

## 2012-07-22 IMAGING — CR DG KNEE COMPLETE 4+V*L*
4 series · 4 of 4 positions shown · non-contrast
Comparison: 04/16/2009

CLINICAL DATA: Left knee pain, twist injury

LEFT KNEE - COMPLETE 4+ VIEW

[view not recorded (1 of 4)]
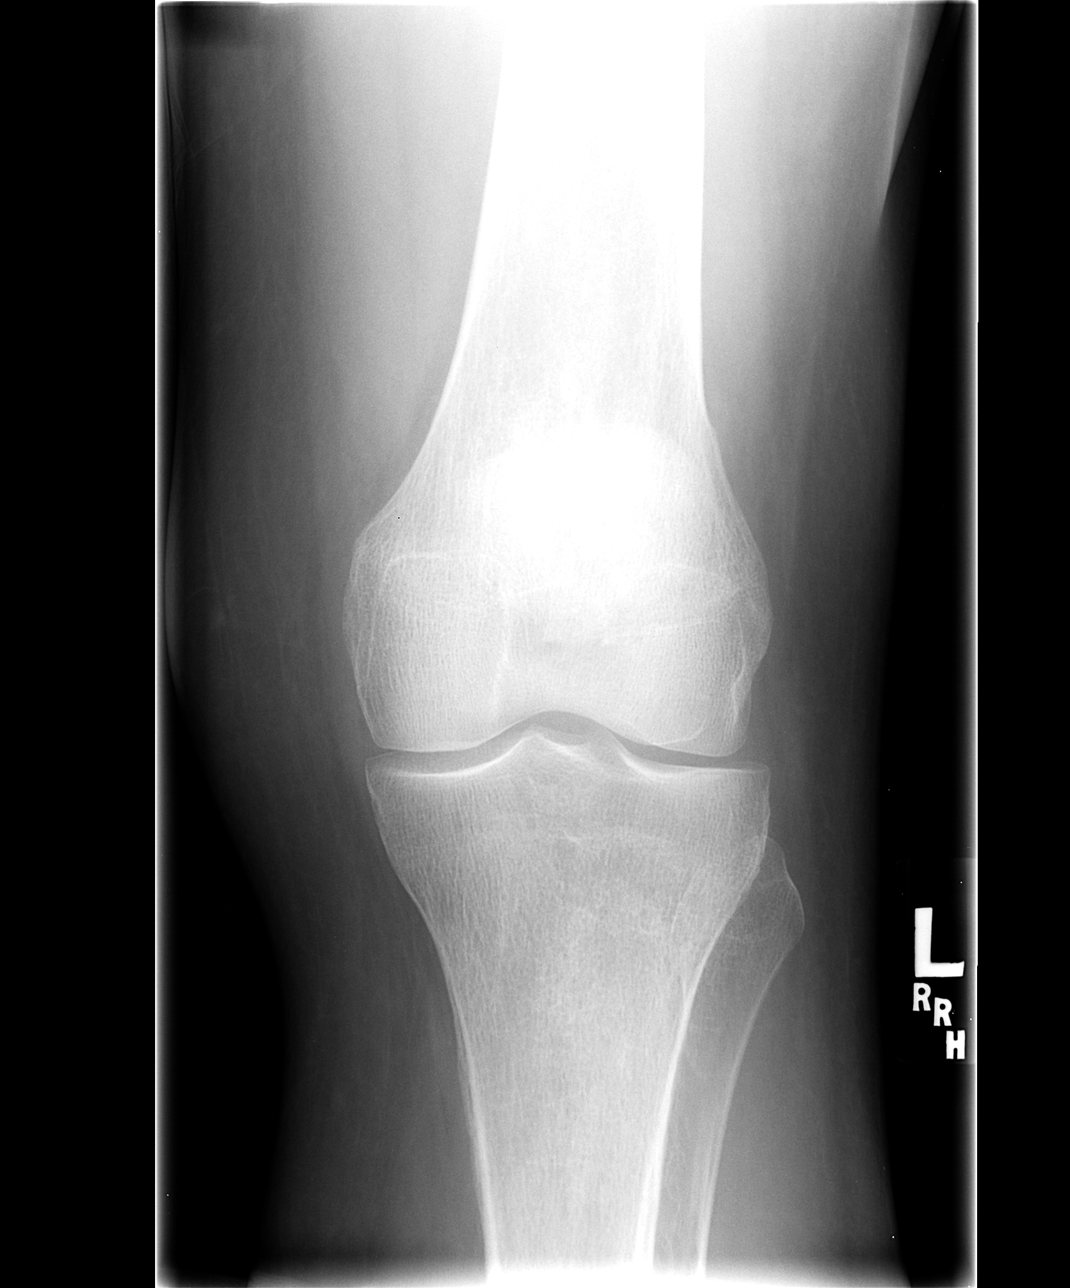

[view not recorded (2 of 4)]
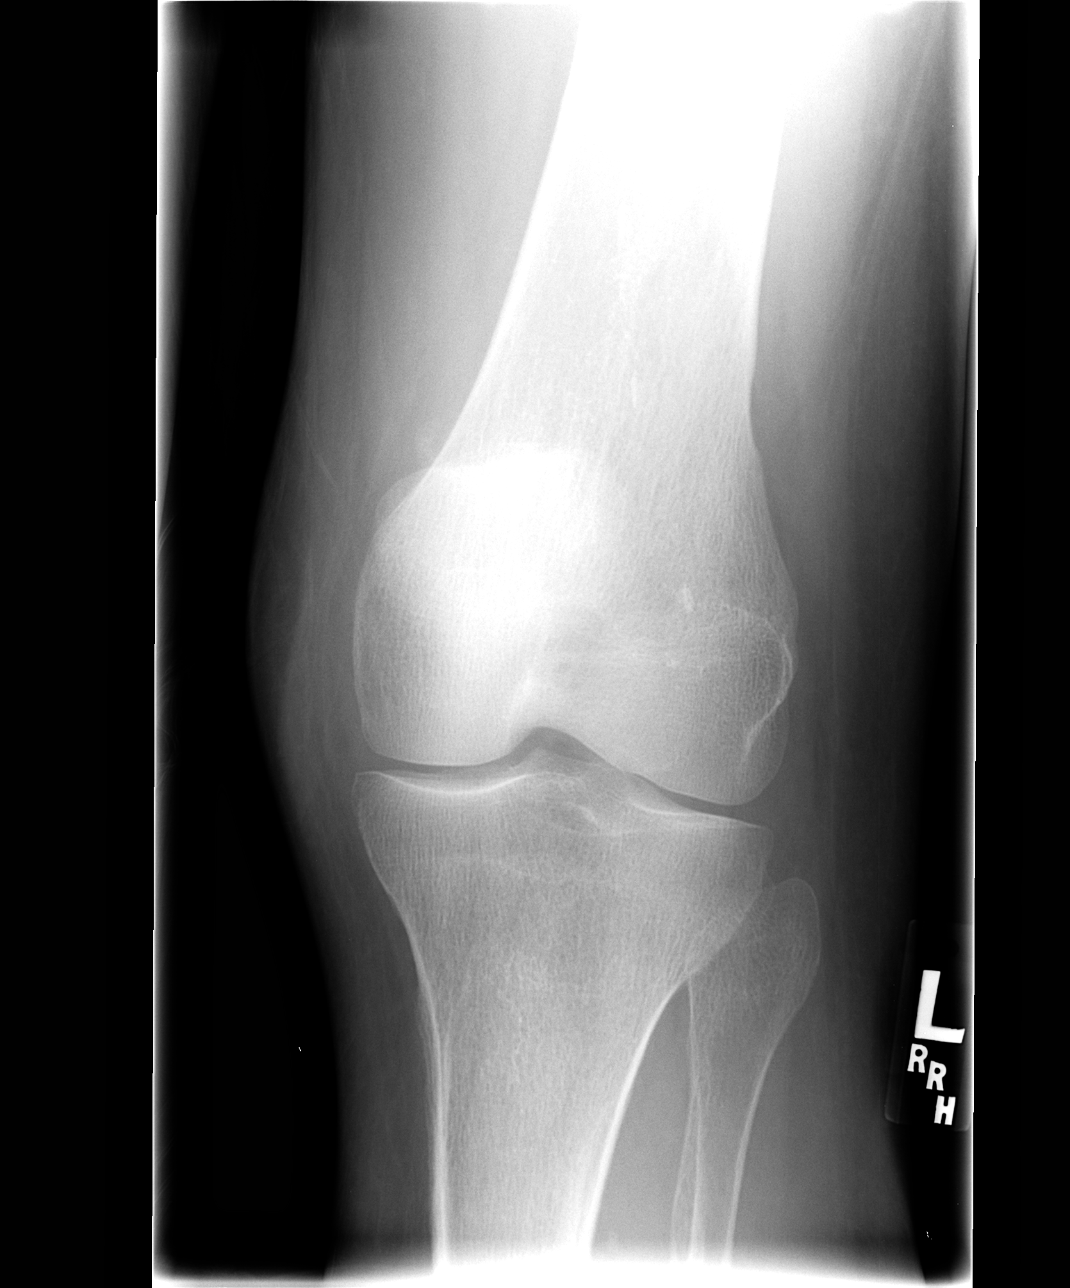

[view not recorded (3 of 4)]
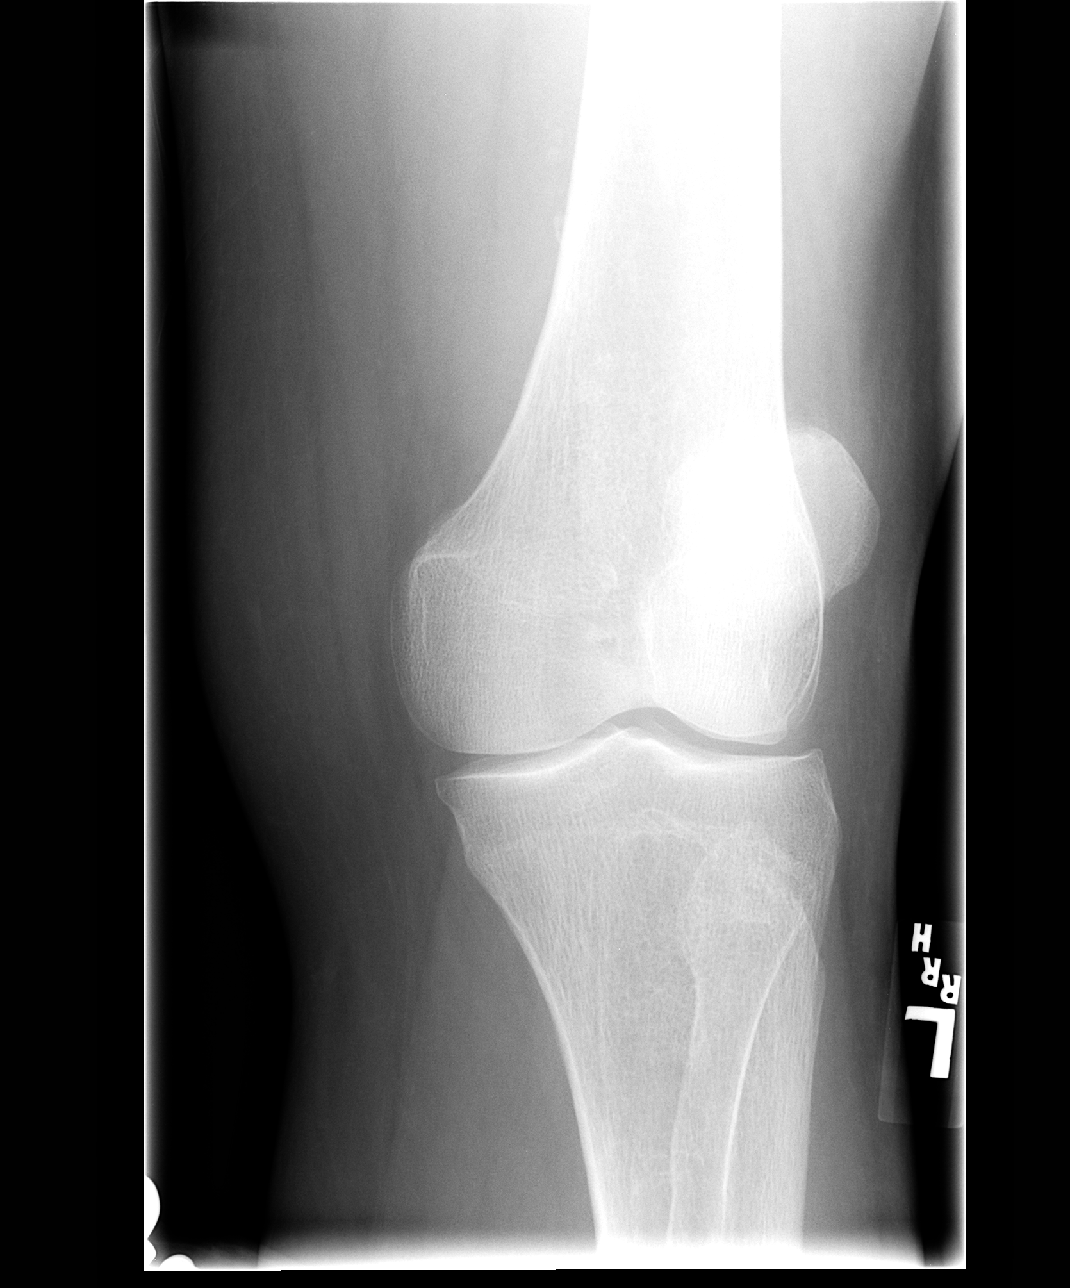

[view not recorded (4 of 4)]
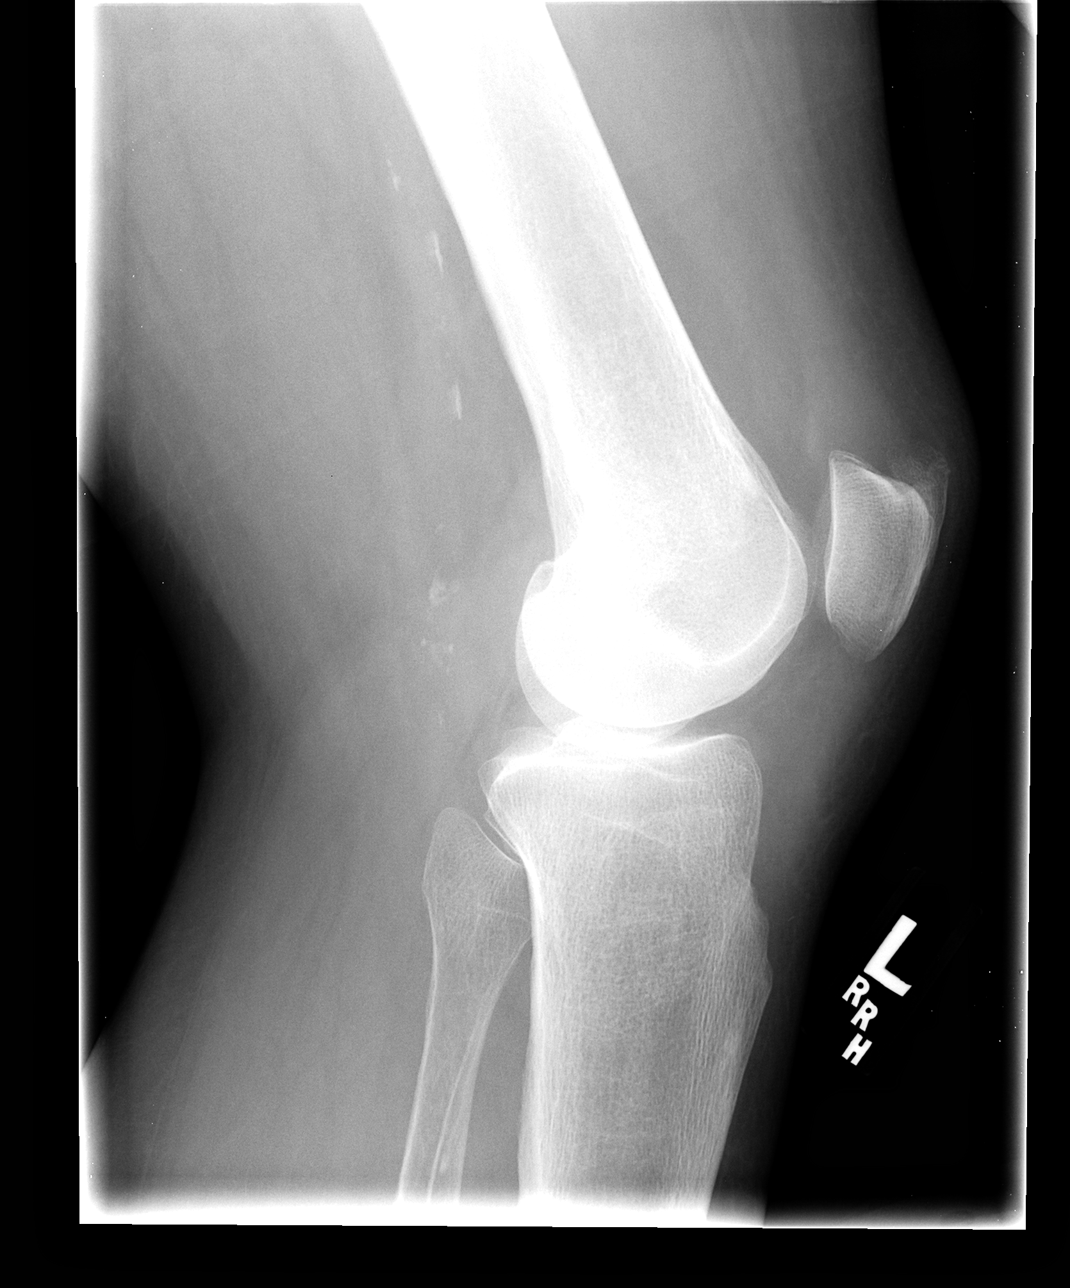

[4 of 4 positions shown; findings below may reference images not displayed]

FINDINGS: Osseous mineralization appears decreased.
Diffuse joint space narrowing.
Bulky superior patellar spur at quadriceps tendon insertion.
No acute fracture, dislocation or bone destruction.
No knee joint effusion.
Scattered atherosclerotic calcifications, premature for age.
Question anterior soft tissue swelling infrapatellar.
IMPRESSION: Osseous demineralization questioned.
Diffuse joint space narrowing compatible with degenerative changes.
No acute osseous abnormalities.
Premature atherosclerotic calcifications for age.

## 2012-07-23 ENCOUNTER — Telehealth: Payer: Self-pay | Admitting: Gastroenterology

## 2012-07-23 NOTE — Telephone Encounter (Signed)
Patient called and cancelled his procedure with [facility] and he will call us back to R/S later

## 2012-07-23 NOTE — Telephone Encounter (Signed)
He will call us back at a later time to R/S he will need a new H & P before rescheduling

## 2012-07-25 ENCOUNTER — Encounter (HOSPITAL_COMMUNITY): Admission: RE | Payer: Self-pay | Source: Ambulatory Visit

## 2012-07-25 ENCOUNTER — Ambulatory Visit (HOSPITAL_COMMUNITY): Admission: RE | Admit: 2012-07-25 | Payer: Medicare Other | Source: Ambulatory Visit | Admitting: Internal Medicine

## 2012-07-25 SURGERY — EGD (ESOPHAGOGASTRODUODENOSCOPY)
Anesthesia: Monitor Anesthesia Care

## 2012-07-27 IMAGING — CR DG KNEE COMPLETE 4+V*L*
4 series · 4 of 4 positions shown · non-contrast
Comparison: 01/07/2011

CLINICAL DATA: Knee pain post fall

LEFT KNEE - COMPLETE 4+ VIEW

[view not recorded (1 of 4)]
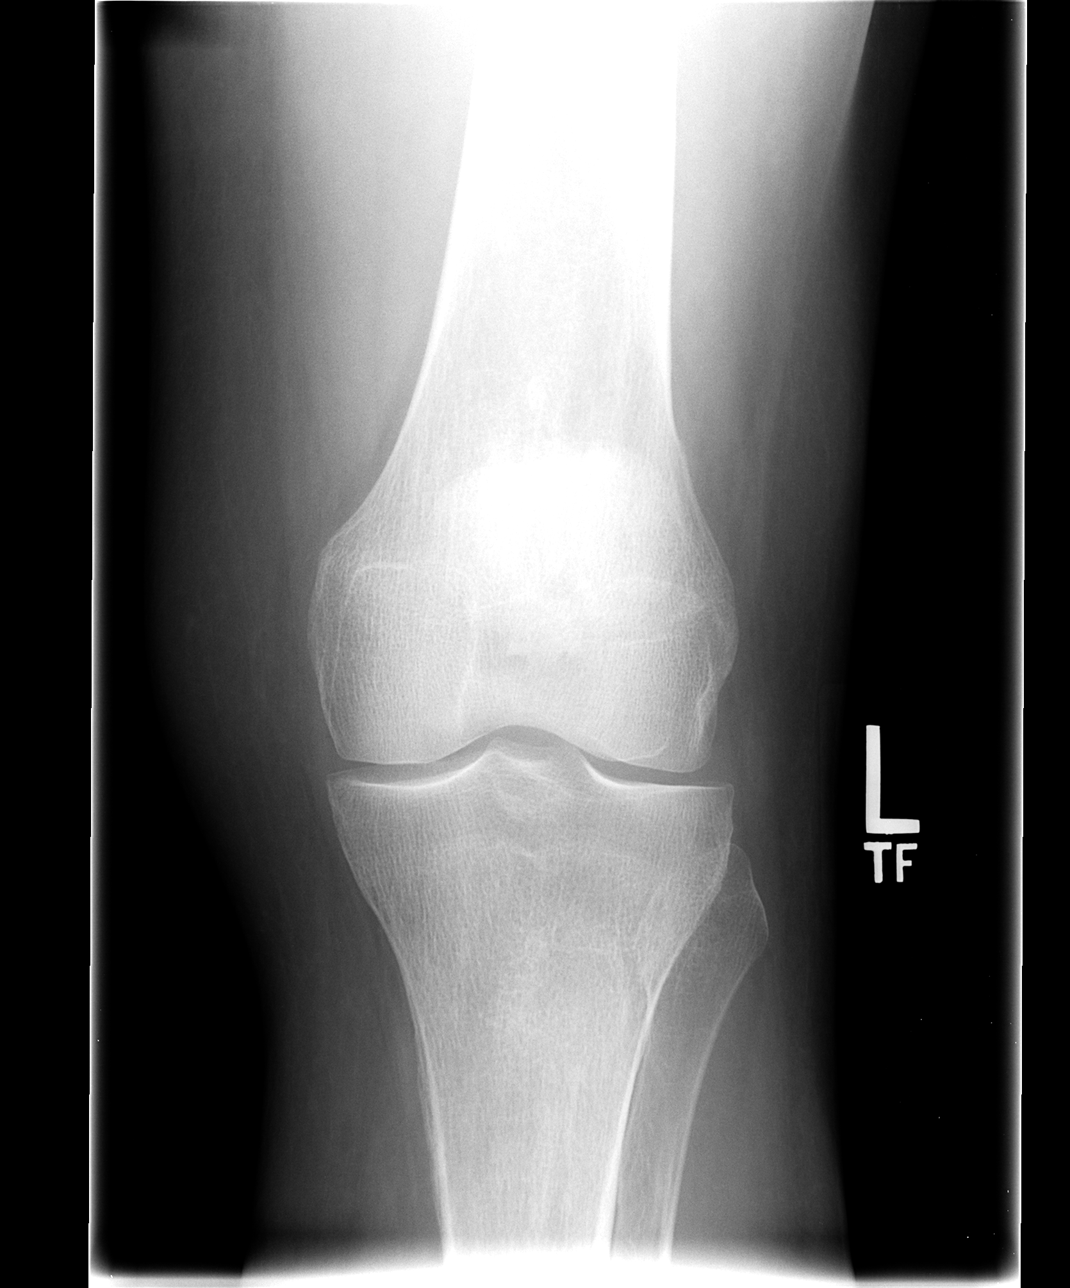

[view not recorded (2 of 4)]
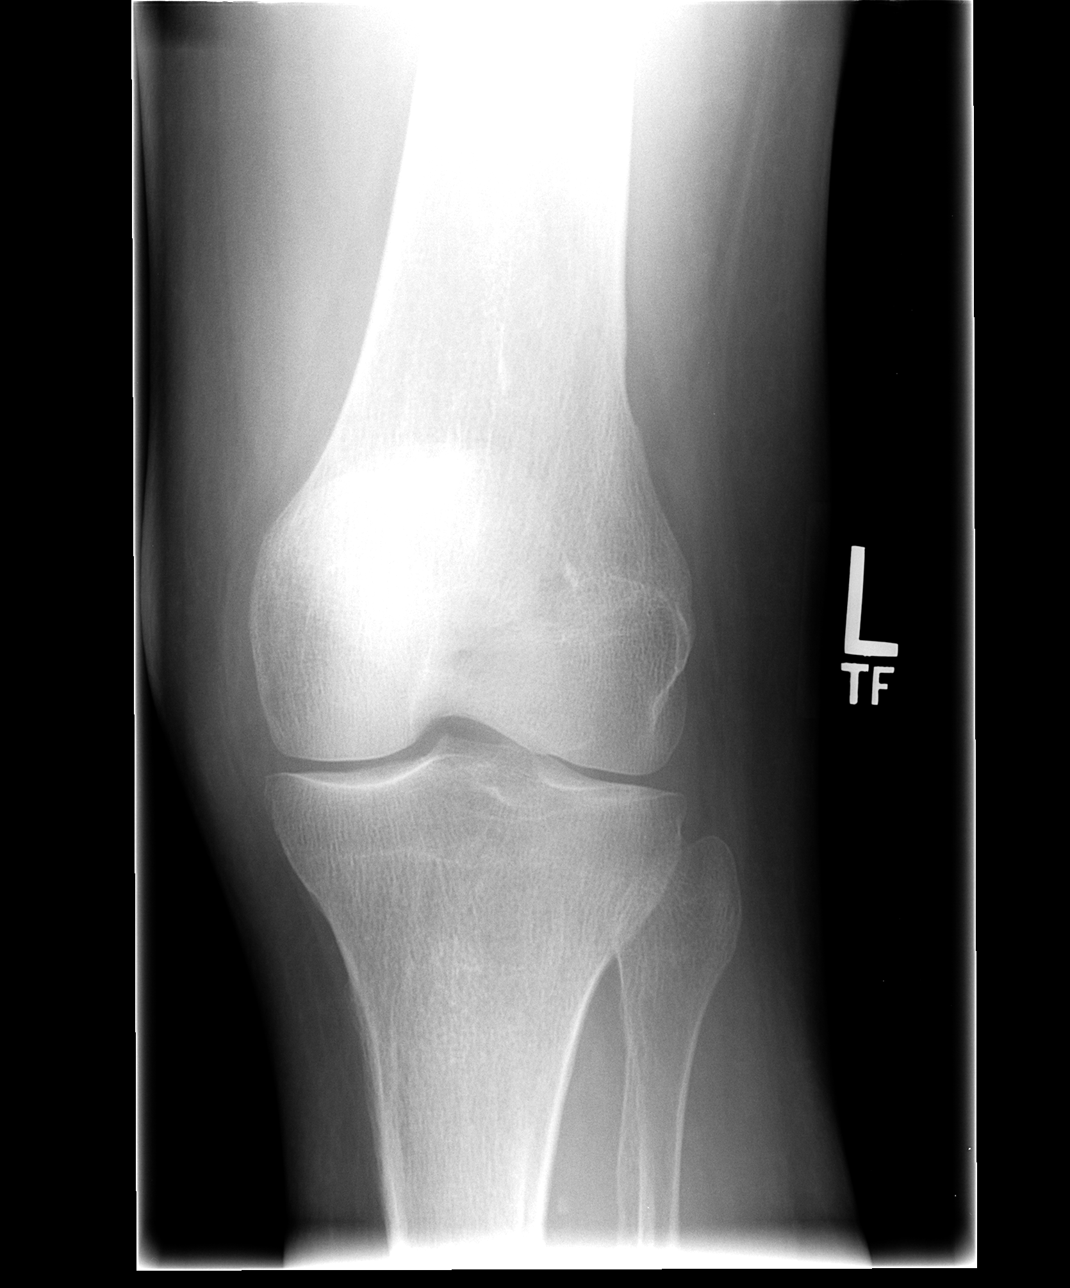

[view not recorded (3 of 4)]
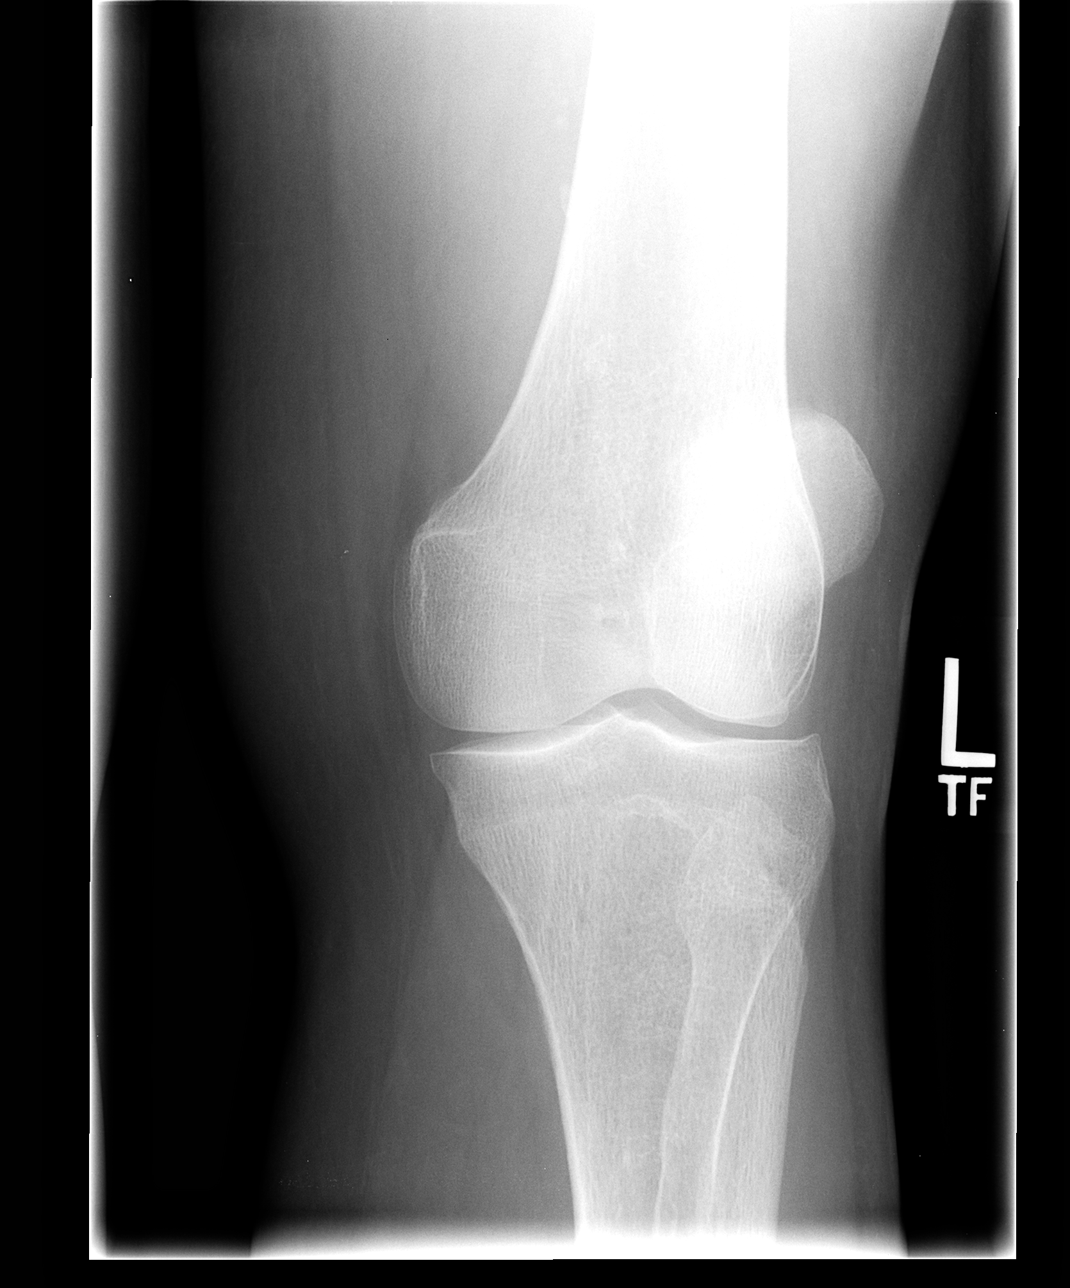

[view not recorded (4 of 4)]
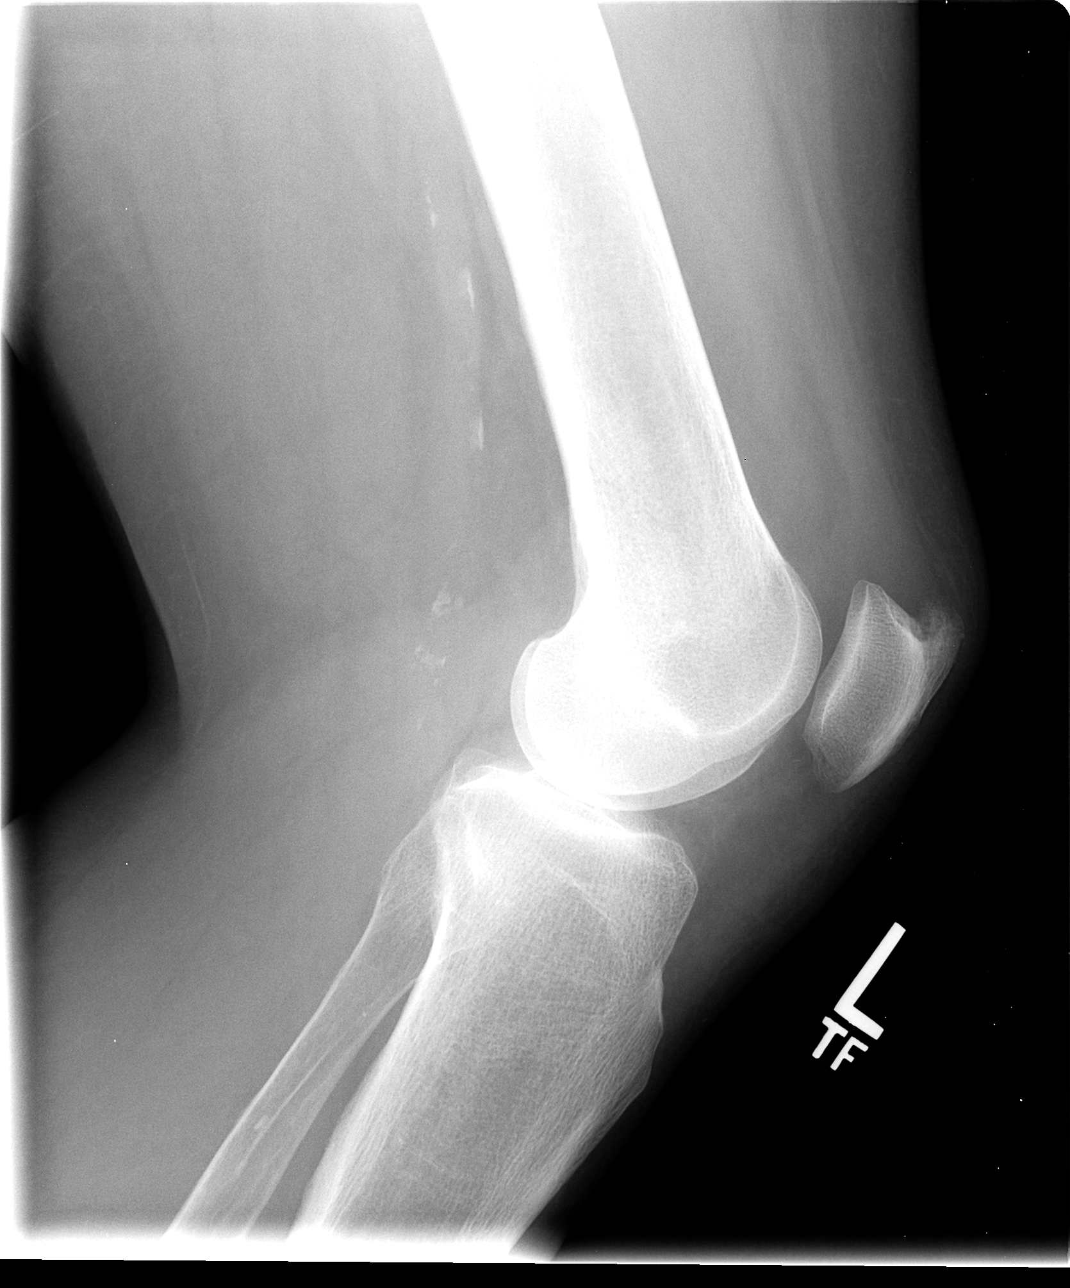

[4 of 4 positions shown; findings below may reference images not displayed]

FINDINGS: Scattered joint space narrowing.
Question slight bony demineralization.
Bulky patellar spur quadriceps tendon insertion.
No acute fracture, dislocation or bone destruction.
Premature atherosclerotic calcification for age noted at distal
superficial femoral and popliteal arteries.
IMPRESSION: Mild degenerative changes.
No acute osseous abnormalities.
Question slight demineralization.
Premature atherosclerotic calcification for age.

## 2012-08-05 IMAGING — CR DG ELBOW COMPLETE 3+V*L*
4 series · 4 of 4 positions shown · non-contrast
Comparison: None.

CLINICAL DATA: Fall injury with left elbow pain and swelling.
Antecubital space fistula.

LEFT ELBOW - COMPLETE 3+ VIEW

[view not recorded (1 of 4)]
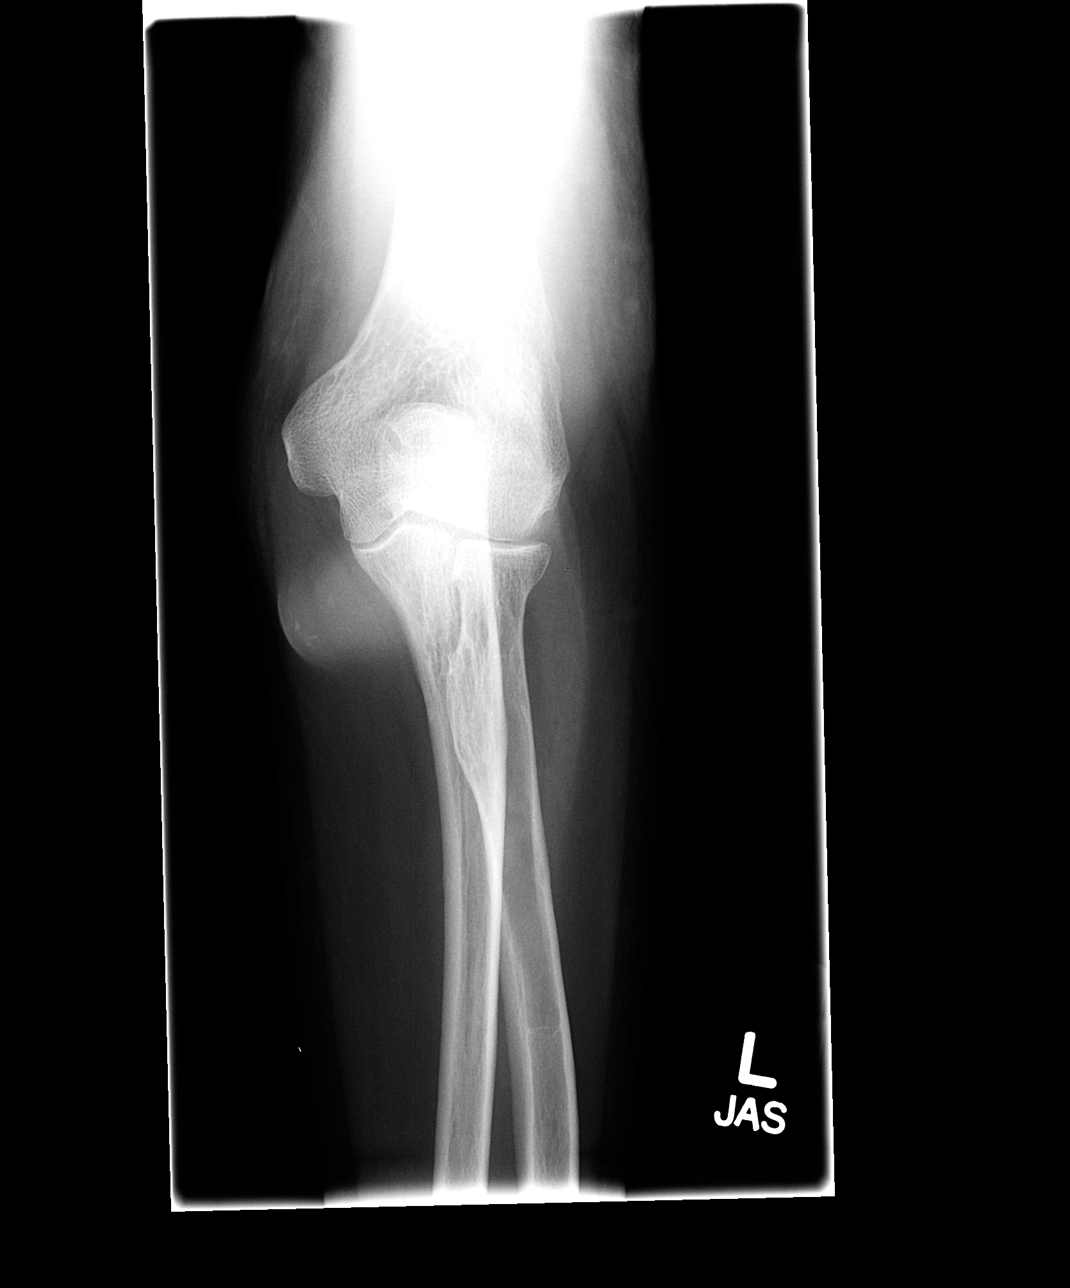

[view not recorded (2 of 4)]
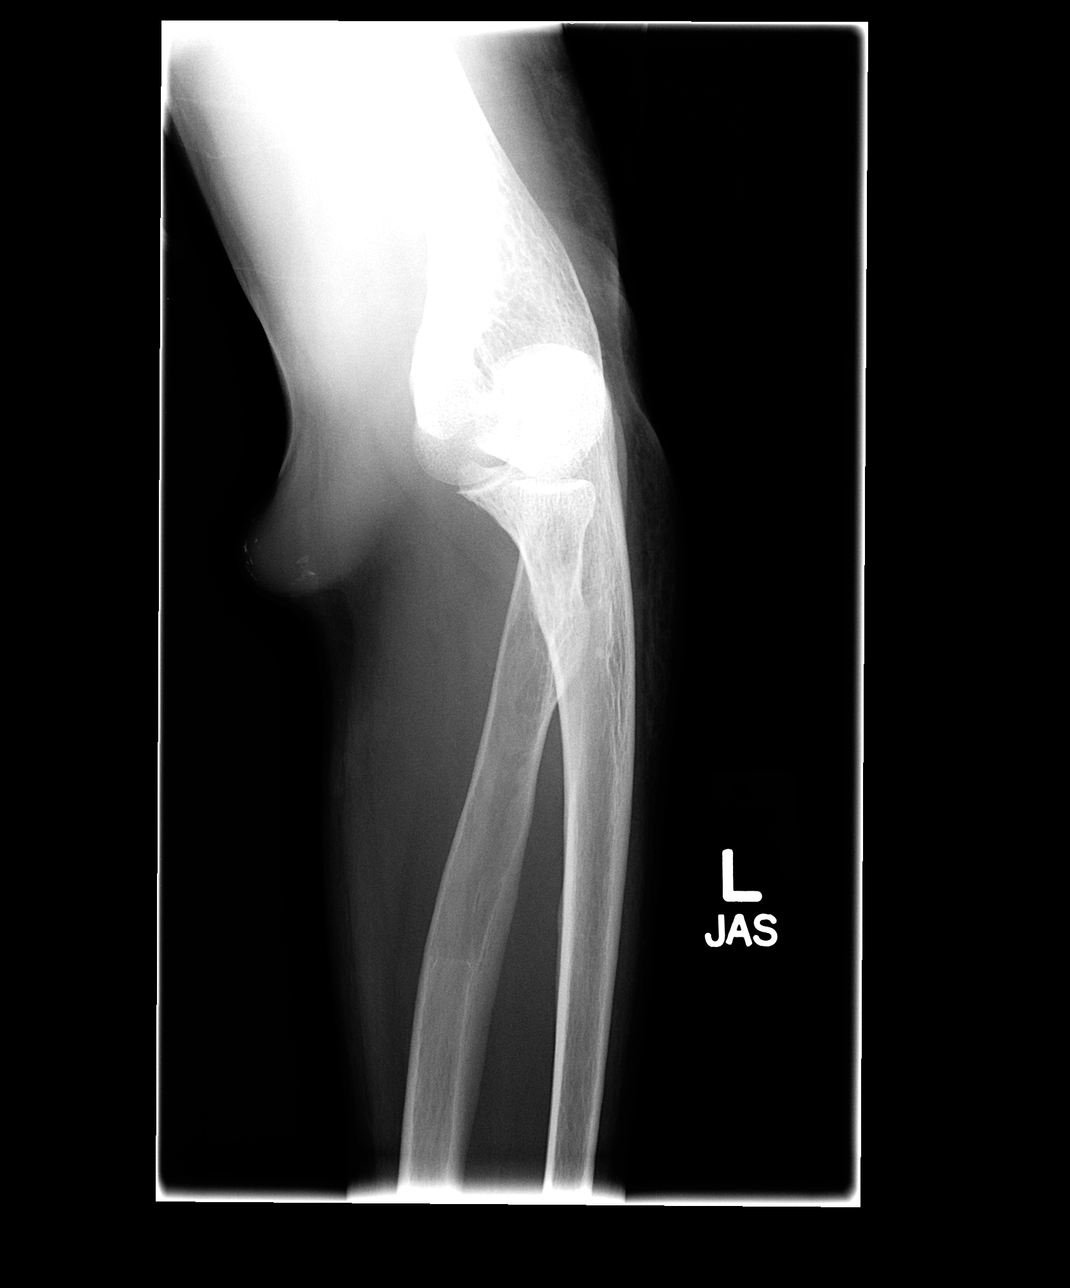

[view not recorded (3 of 4)]
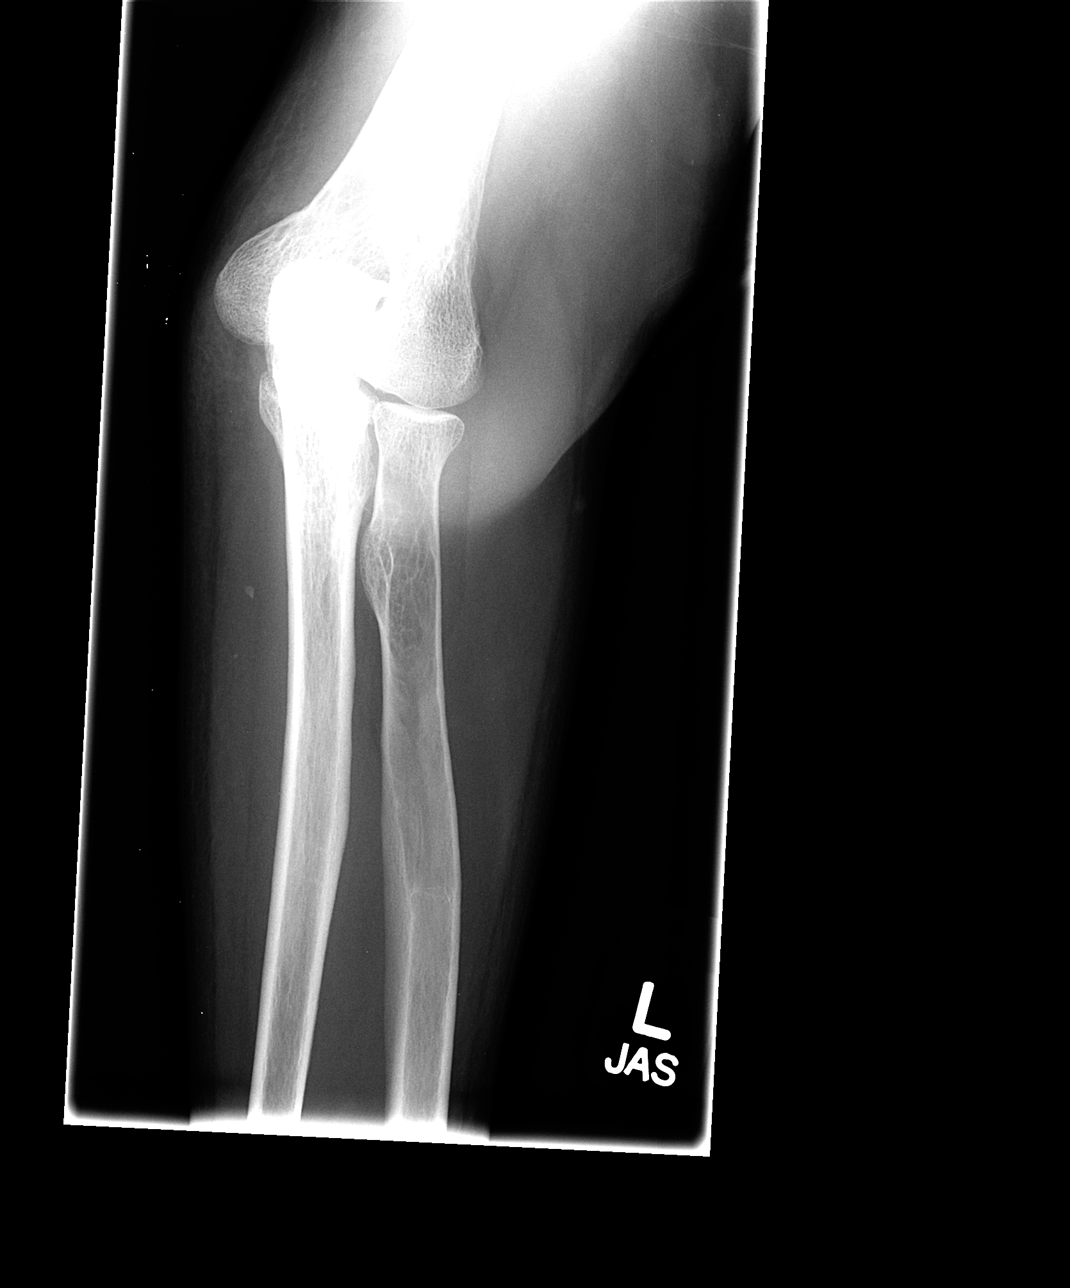

[view not recorded (4 of 4)]
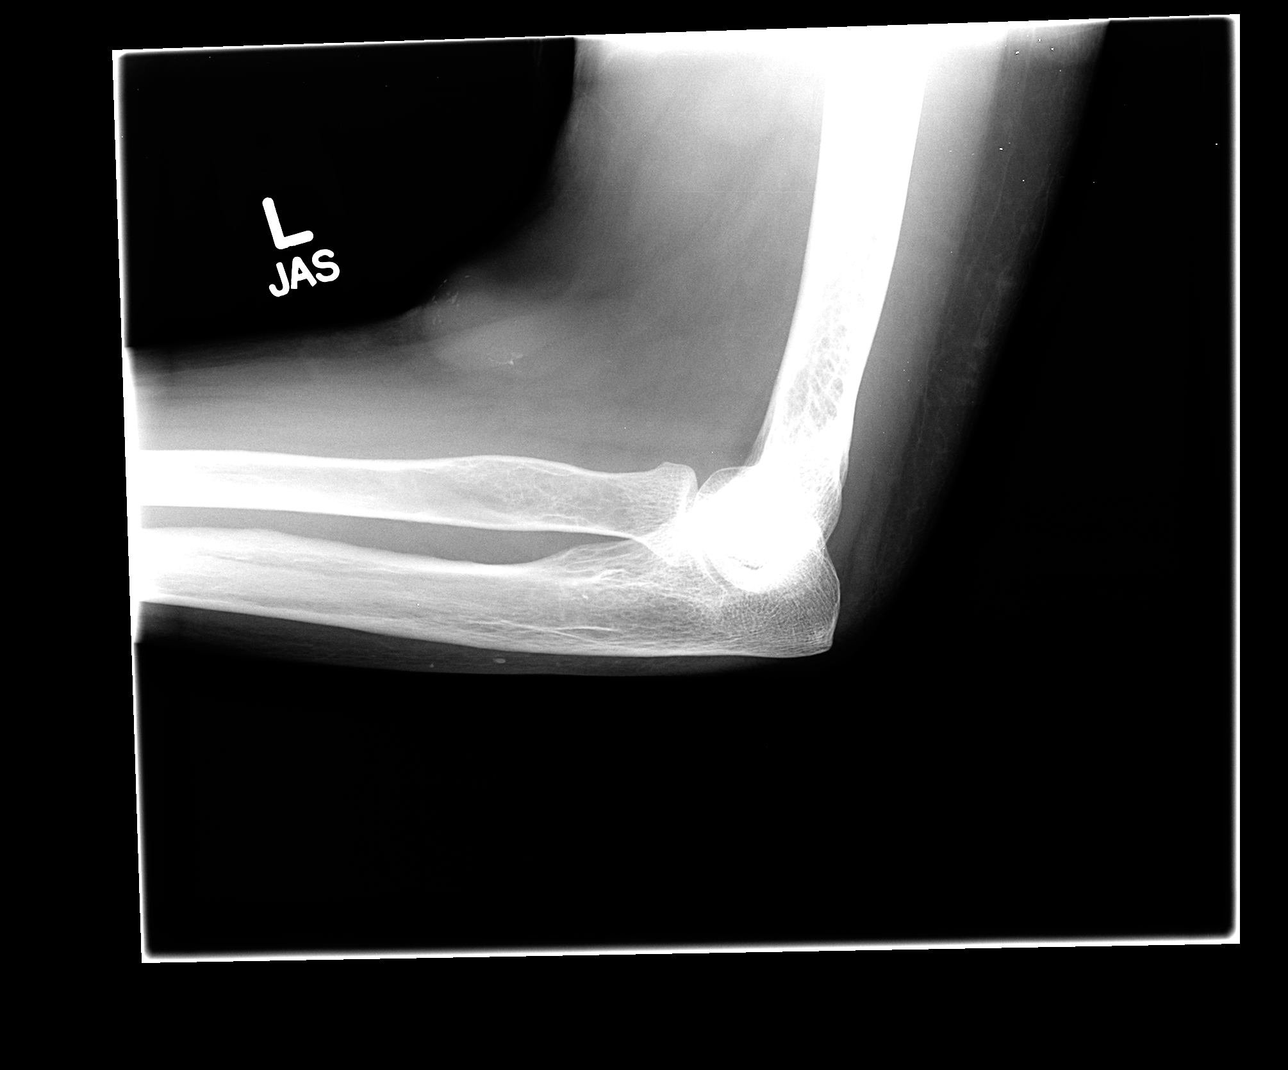

[4 of 4 positions shown; findings below may reference images not displayed]

FINDINGS: No acute fracture, subluxation/dislocation, elevated fat
pads/abnormal left elbow effusion visualized.  Antecubital space
findings consistent with dialysis fistula seen.
IMPRESSION: No acute findings.

## 2012-08-08 IMAGING — CR DG CHEST 1V PORT
1 series · 1 of 1 positions shown · non-contrast
Comparison: Portable exam 7857 hours compared to 01/02/2011

CLINICAL DATA: Shortness of breath, left chest pain, history
coronary artery disease, asthma, CHF, COPD, hypertension, MI,
dialysis

PORTABLE CHEST - 1 VIEW

[view not recorded]
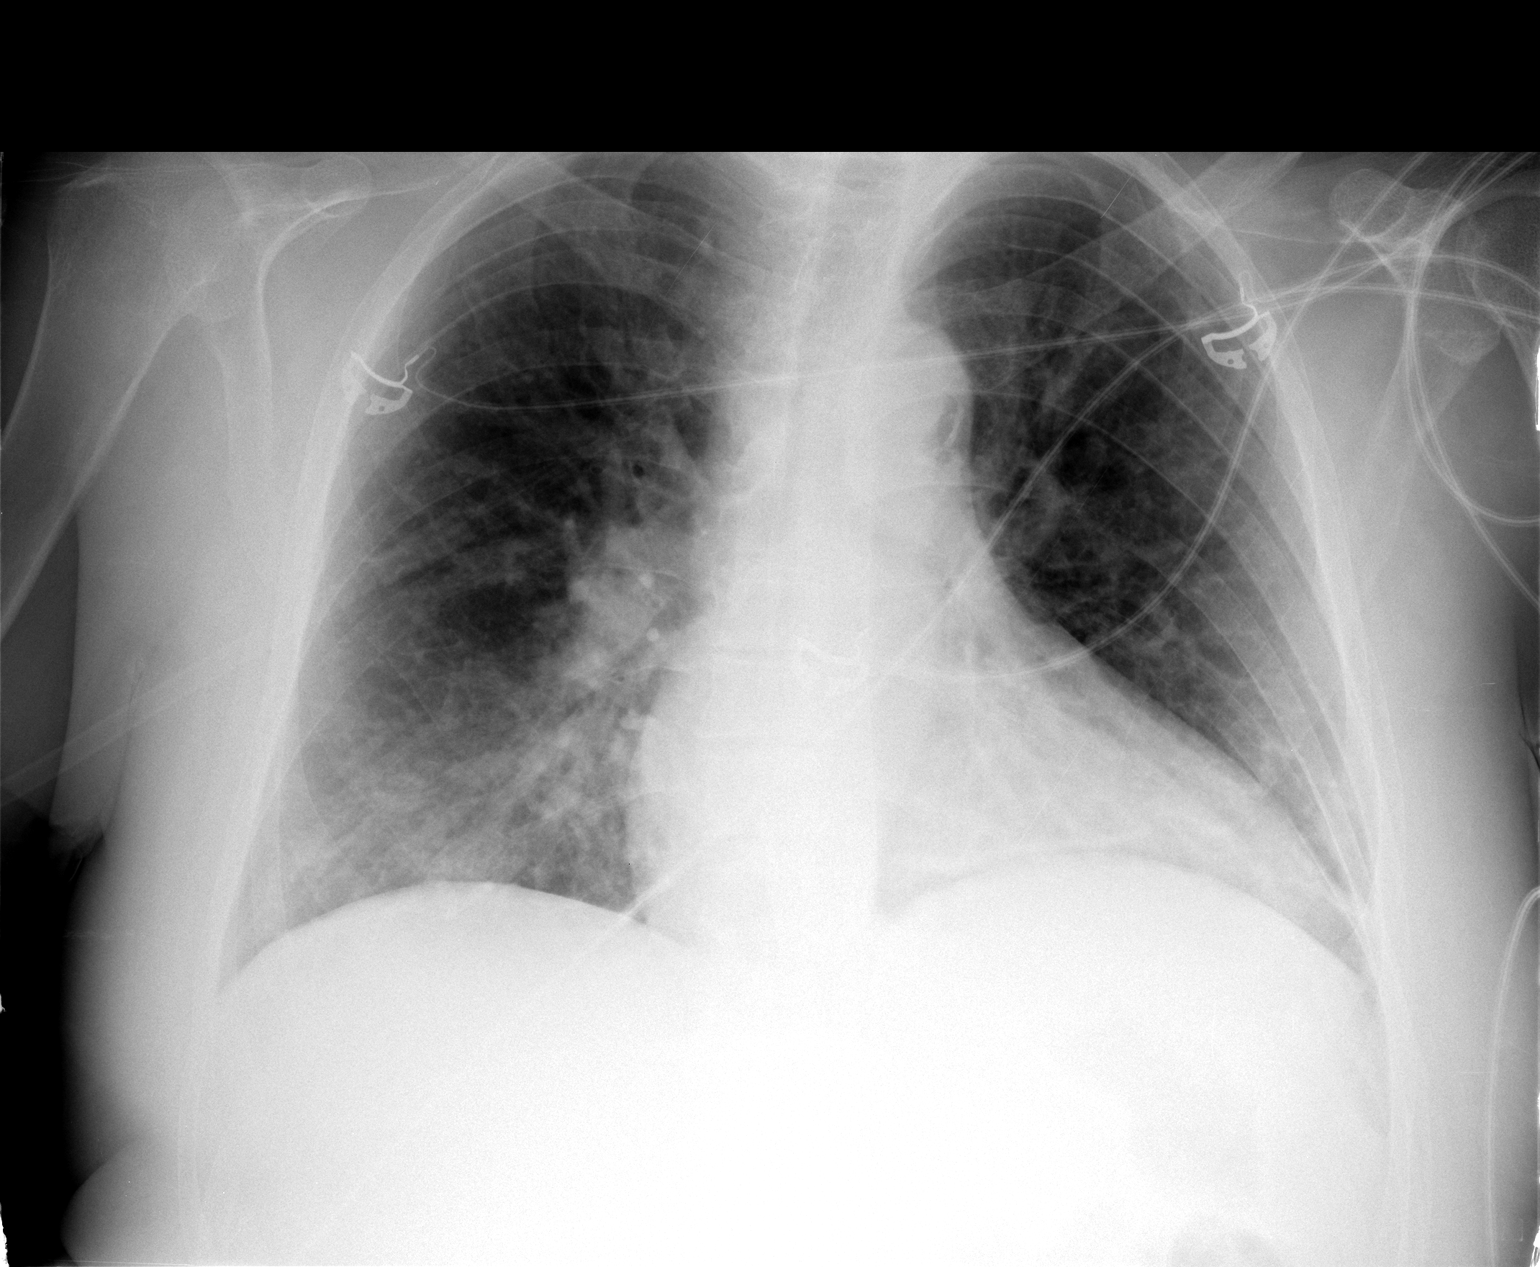

[1 of 1 positions shown; findings below may reference images not displayed]

FINDINGS: Enlargement of cardiac silhouette.
Atherosclerotic calcification aorta.
Minimal pulmonary vascular congestion.
Mild infiltrate at right lung base, with overall improved aeration
versus previous study.
Subsegmental atelectasis left base.
Upper lungs clear.
No gross pleural effusion or pneumothorax.
IMPRESSION: Enlargement of cardiac silhouette.
Subsegmental atelectasis left base.
Right basilar infiltrate.

## 2012-08-10 IMAGING — CR DG CHEST 1V PORT
1 series · 1 of 1 positions shown · non-contrast
Comparison: 01/24/2011.

CLINICAL DATA: Shortness of breath.

PORTABLE CHEST - 1 VIEW

[view not recorded]
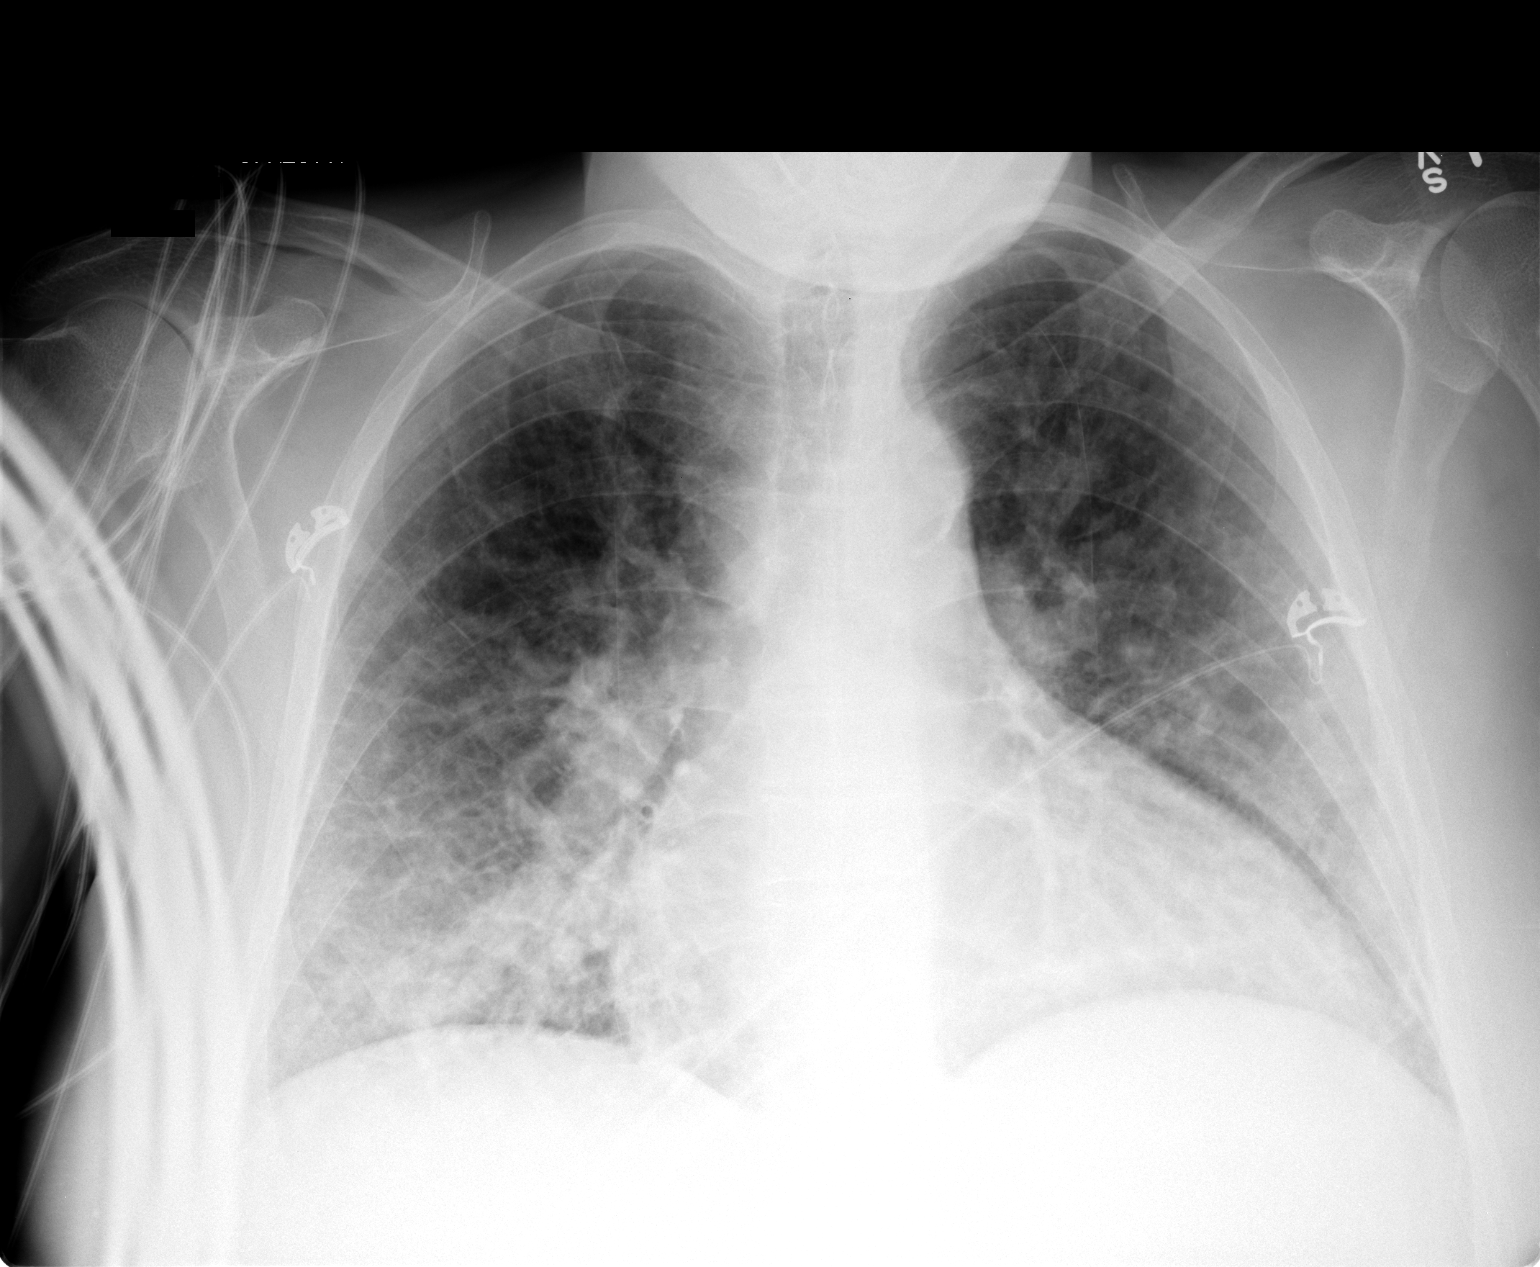

[1 of 1 positions shown; findings below may reference images not displayed]

FINDINGS: A diffuse interstitial pattern is increasing.  More focal
density is evident at the right lower lobe.  The heart is mildly
enlarged.  The lung volumes are low.  No other focal airspace
consolidation is evident.
IMPRESSION: 1.  Cardiomegaly and increasing interstitial densities, suggesting
edema.  This raises concern for congestive heart failure.
2.  Increasing density at the right lung base.  While this may
represent atelectasis, developing pneumonia is also considered.

## 2012-08-14 IMAGING — CR DG CHEST 1V PORT
1 series · 1 of 1 positions shown · non-contrast
Comparison: Chest radiograph 01/26/2011

CLINICAL DATA: Short of breath, chest pain

PORTABLE CHEST - 1 VIEW

[view not recorded]
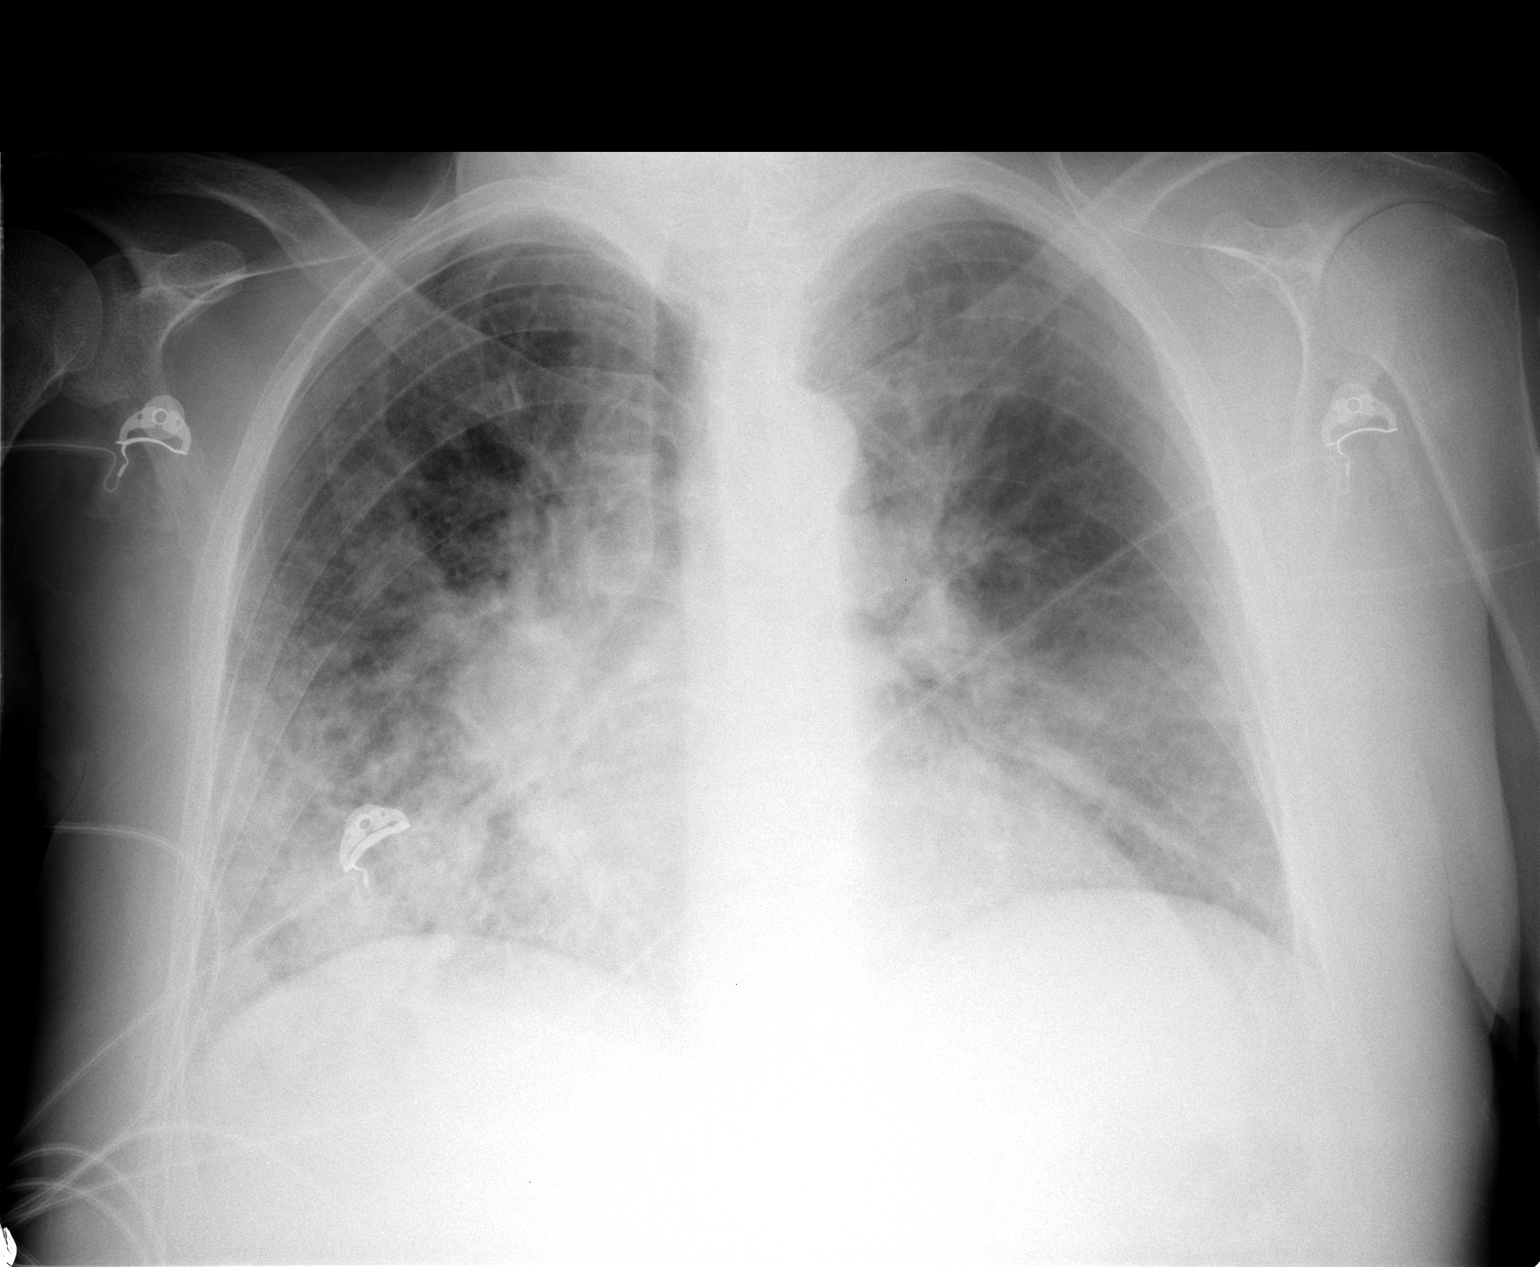

[1 of 1 positions shown; findings below may reference images not displayed]

FINDINGS: Stable cardiac silhouette.  There is increased density of
the right middle lobe and right lower lobe air space disease.
There is perihilar central venous congestion/air space disease
which is also increased.  No pneumothorax.
IMPRESSION: Worsening bilateral air space disease with focality in the right
middle lobe and lower lobe.  Findings concerning for multifocal
pneumonia.

## 2012-08-18 IMAGING — CR DG LUMBAR SPINE COMPLETE 4+V
5 series · 5 of 5 positions shown · non-contrast
Comparison: CT 10/18/2010.

CLINICAL DATA: History of chronic low back pain.  No recent injury.
Remote injury.

LUMBAR SPINE - COMPLETE 4+ VIEW

[view not recorded (1 of 5)]
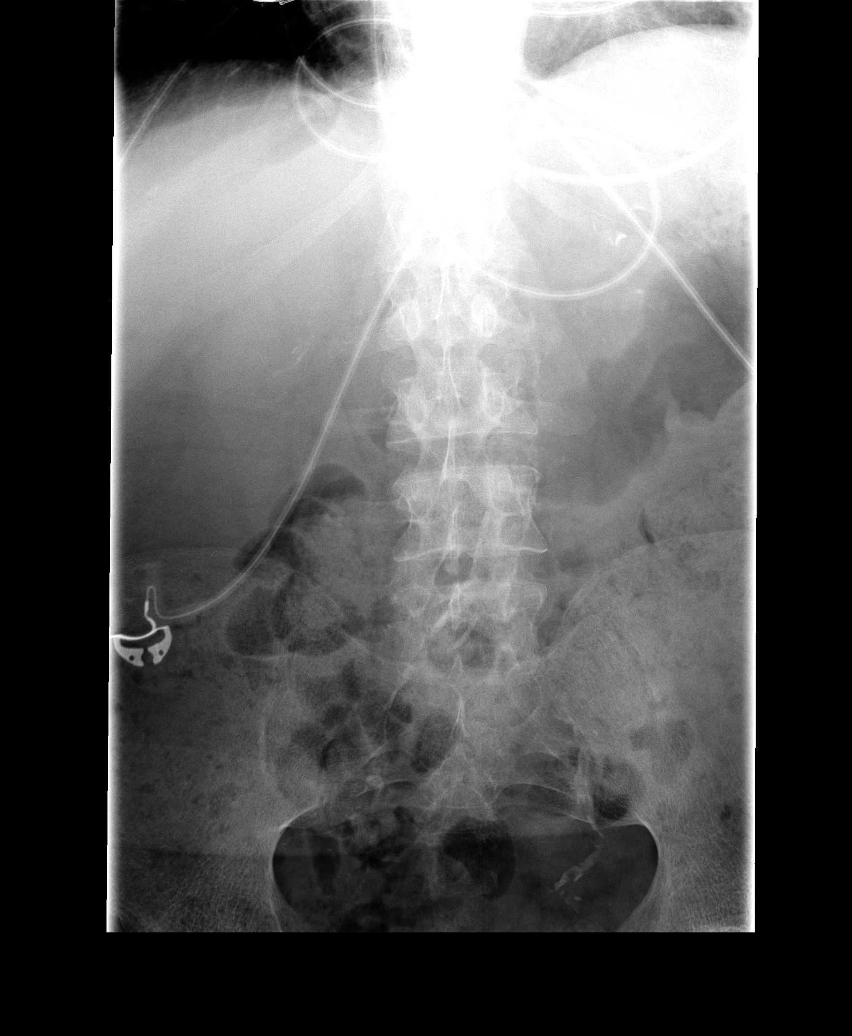

[view not recorded (2 of 5)]
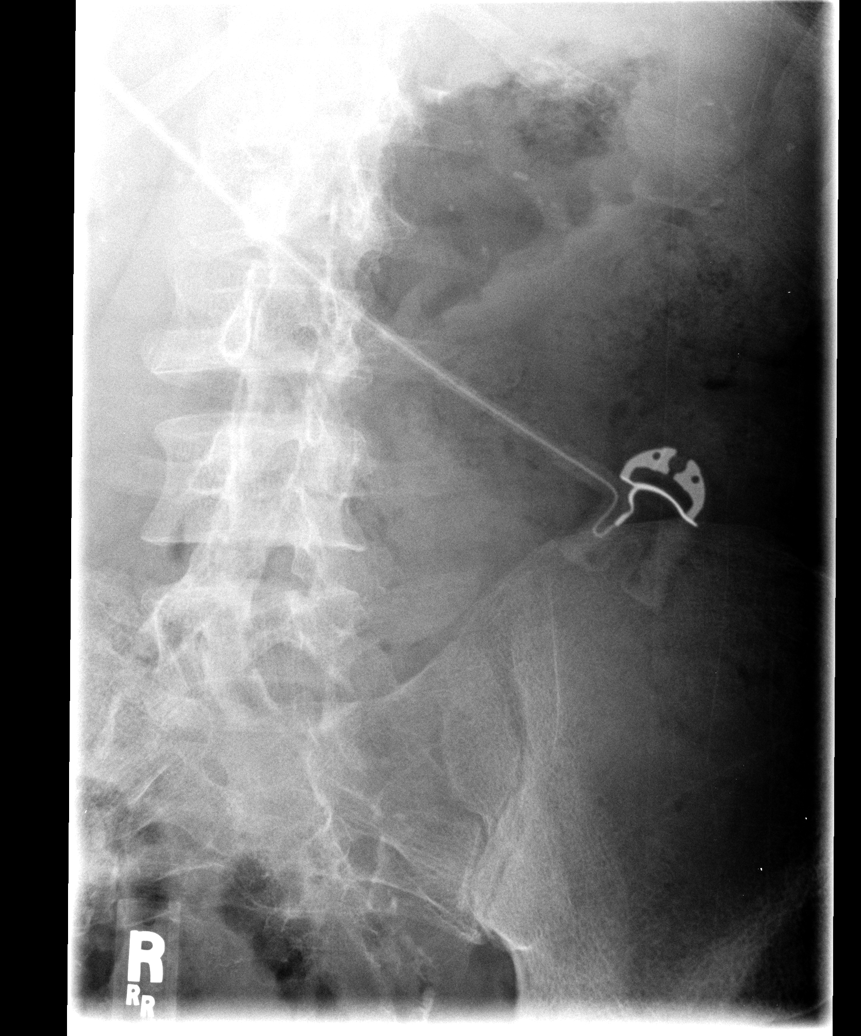

[view not recorded (3 of 5)]
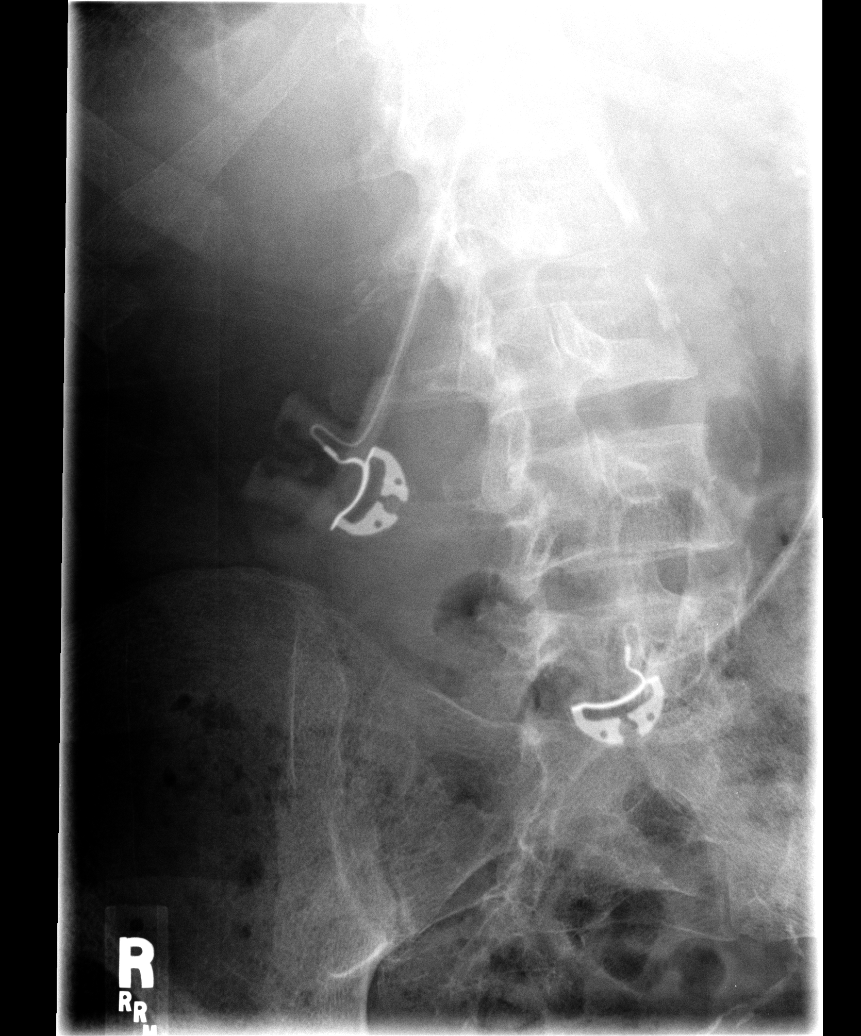

[view not recorded (4 of 5)]
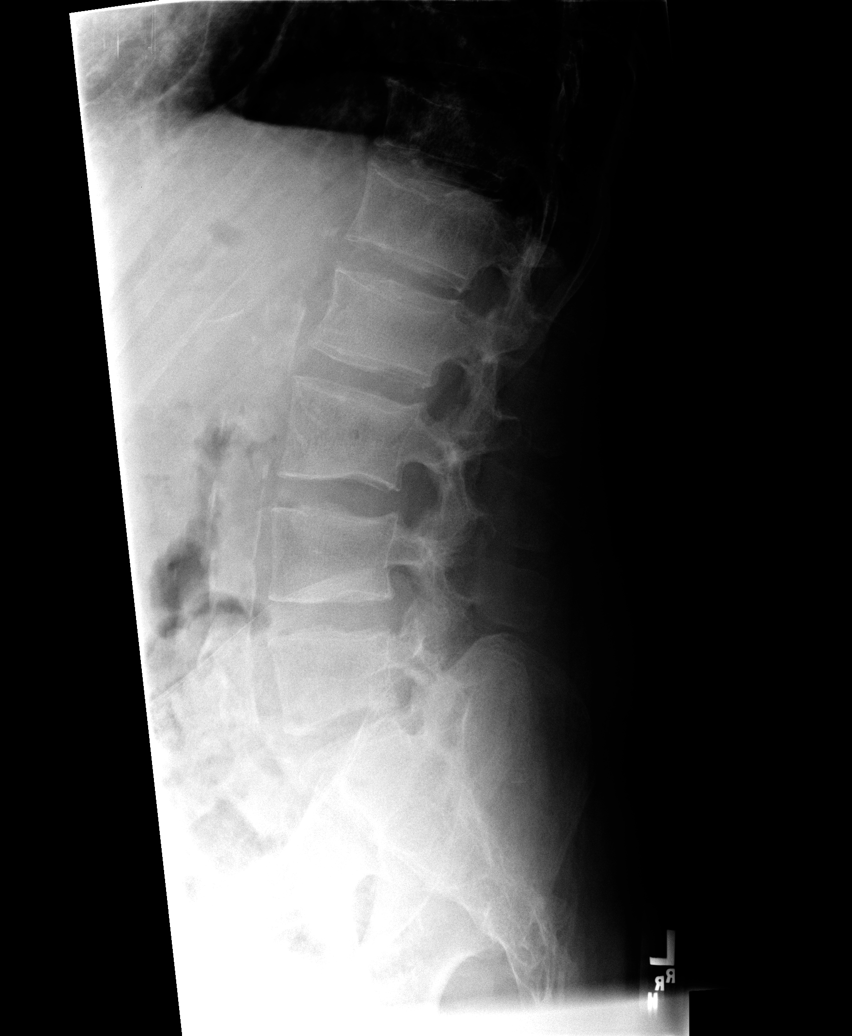

[view not recorded (5 of 5)]
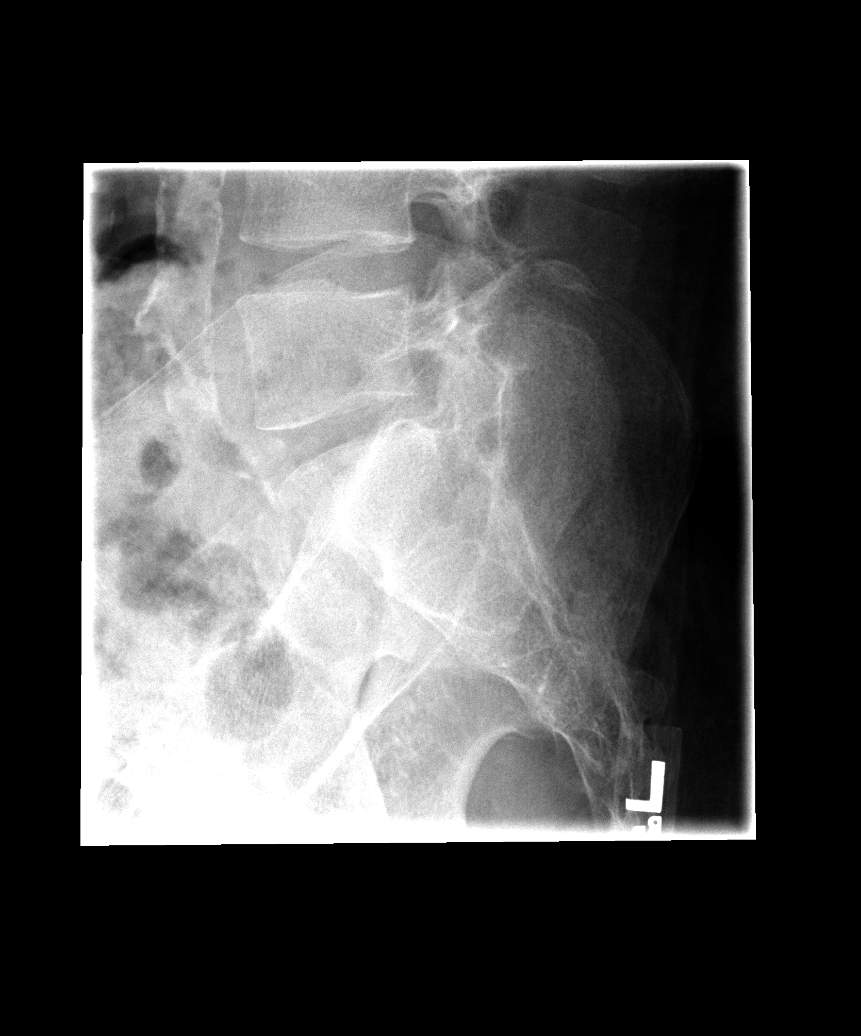

[5 of 5 positions shown; findings below may reference images not displayed]

FINDINGS: There is fecal distention of portions of the colon.  SI
joints appear intact.  Nonaneurysmal arterial and common iliac
artery calcifications are present.  Minimal early degenerative
spondylosis spurring is seen compatible with age.  Intervertebral
disc spaces are maintained.  No fracture or bony destruction is
seen.
IMPRESSION: Fecal distention of portions of colon.  Minimal early degenerative
spondylosis of the lumbar spine.  No fracture or other acute
process is evident.  Nonaneurysmal aortic and arterial
calcifications.

## 2012-08-18 IMAGING — CR DG CHEST 2V
3 series · 3 of 3 positions shown · non-contrast
Comparison: 01/30/2011.  10/05/2010.

CLINICAL DATA: History given of congestive heart failure.  The
patient gave history of pneumonia.  Previous history of shortness
of breath and chest pain.

CHEST - 2 VIEW

[view not recorded (1 of 3)]
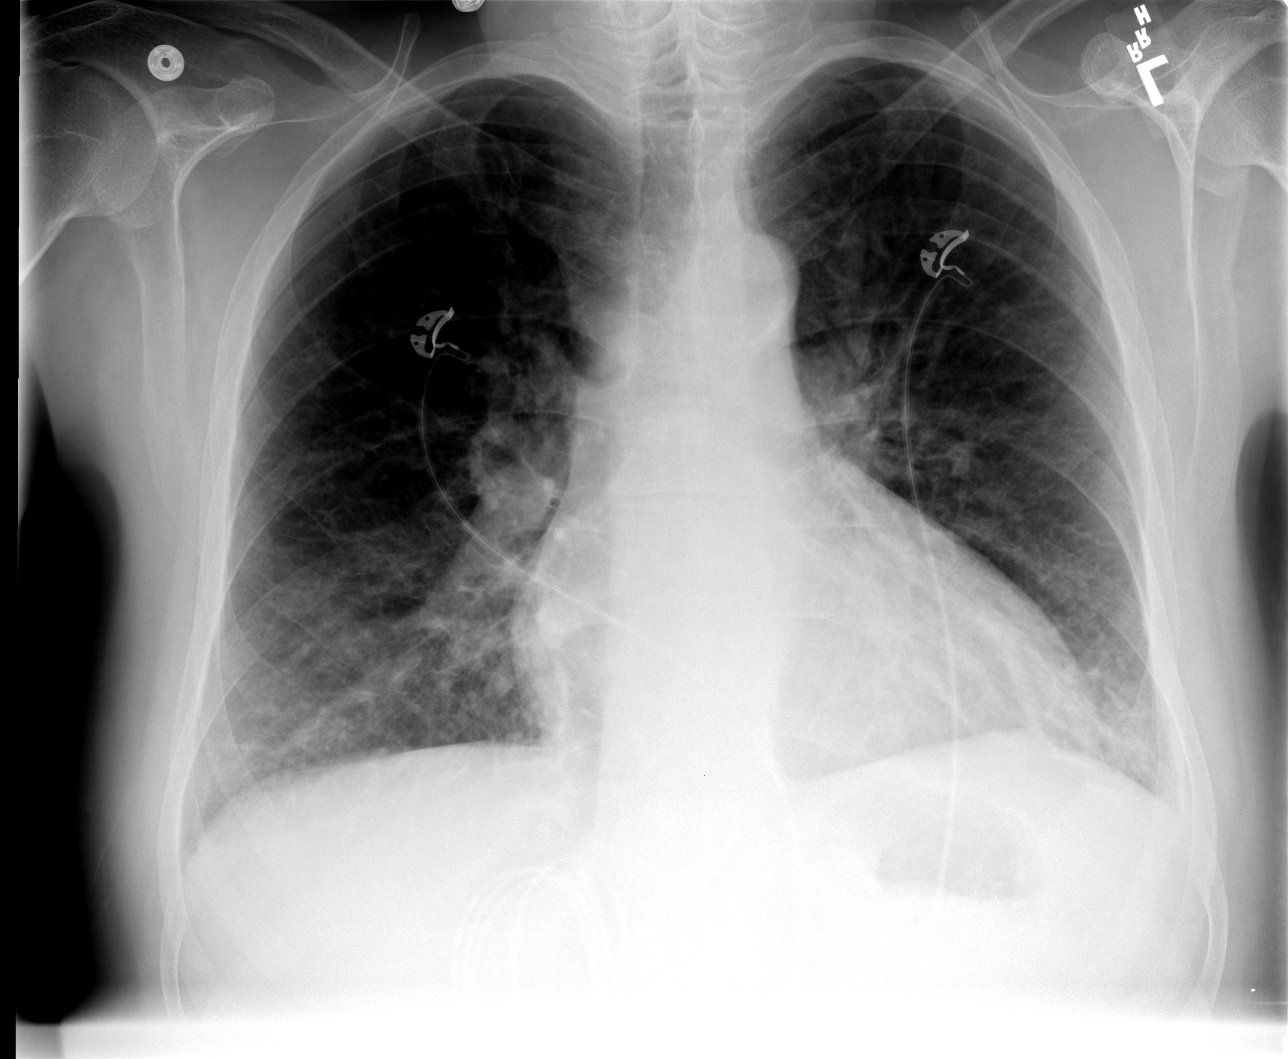

[view not recorded (2 of 3)]
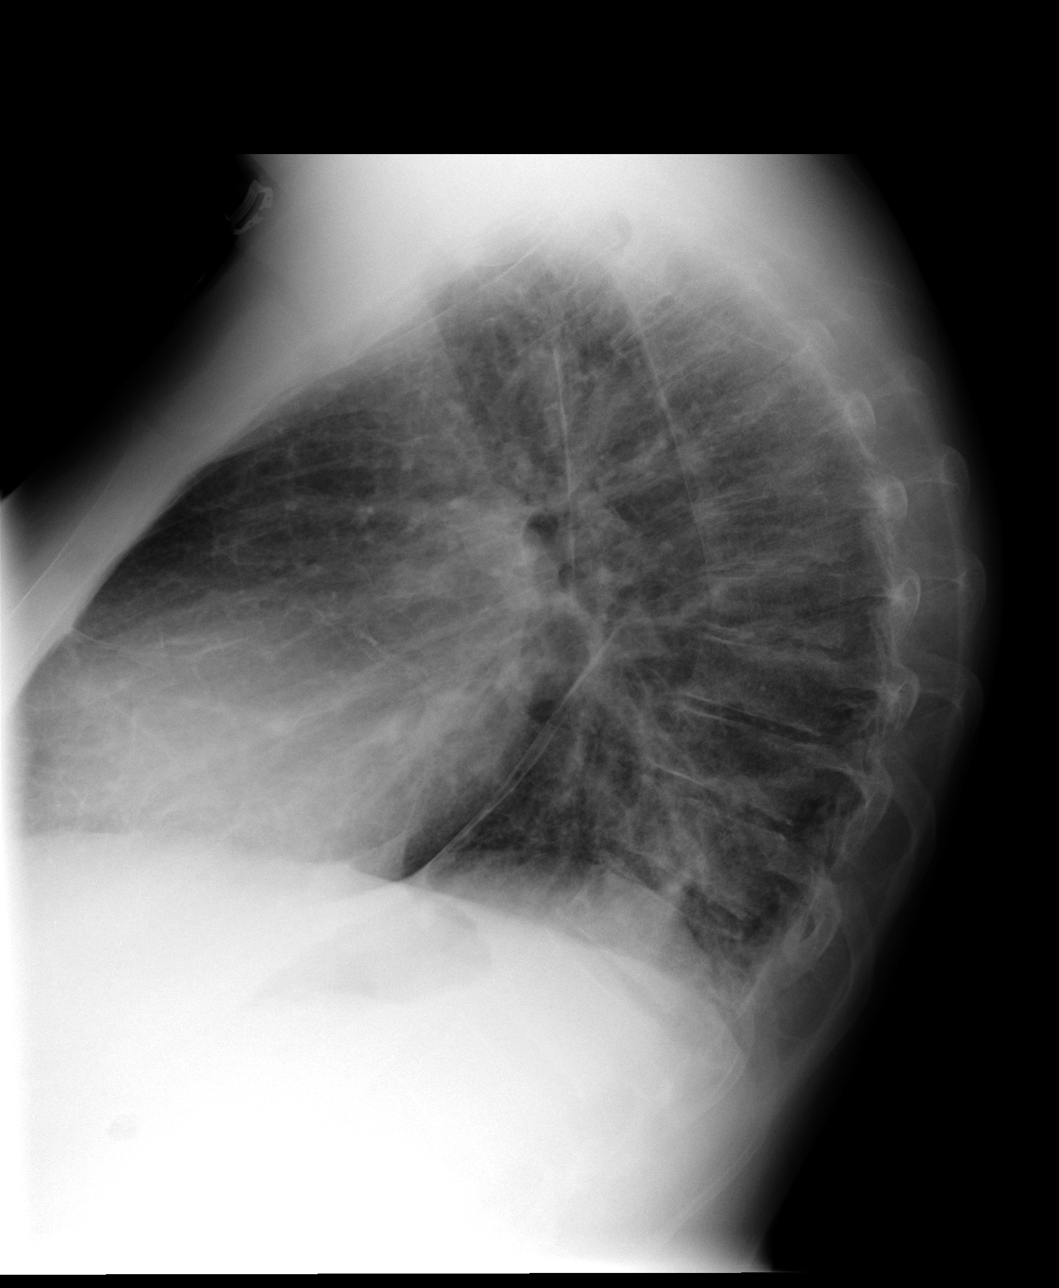

[view not recorded (3 of 3)]
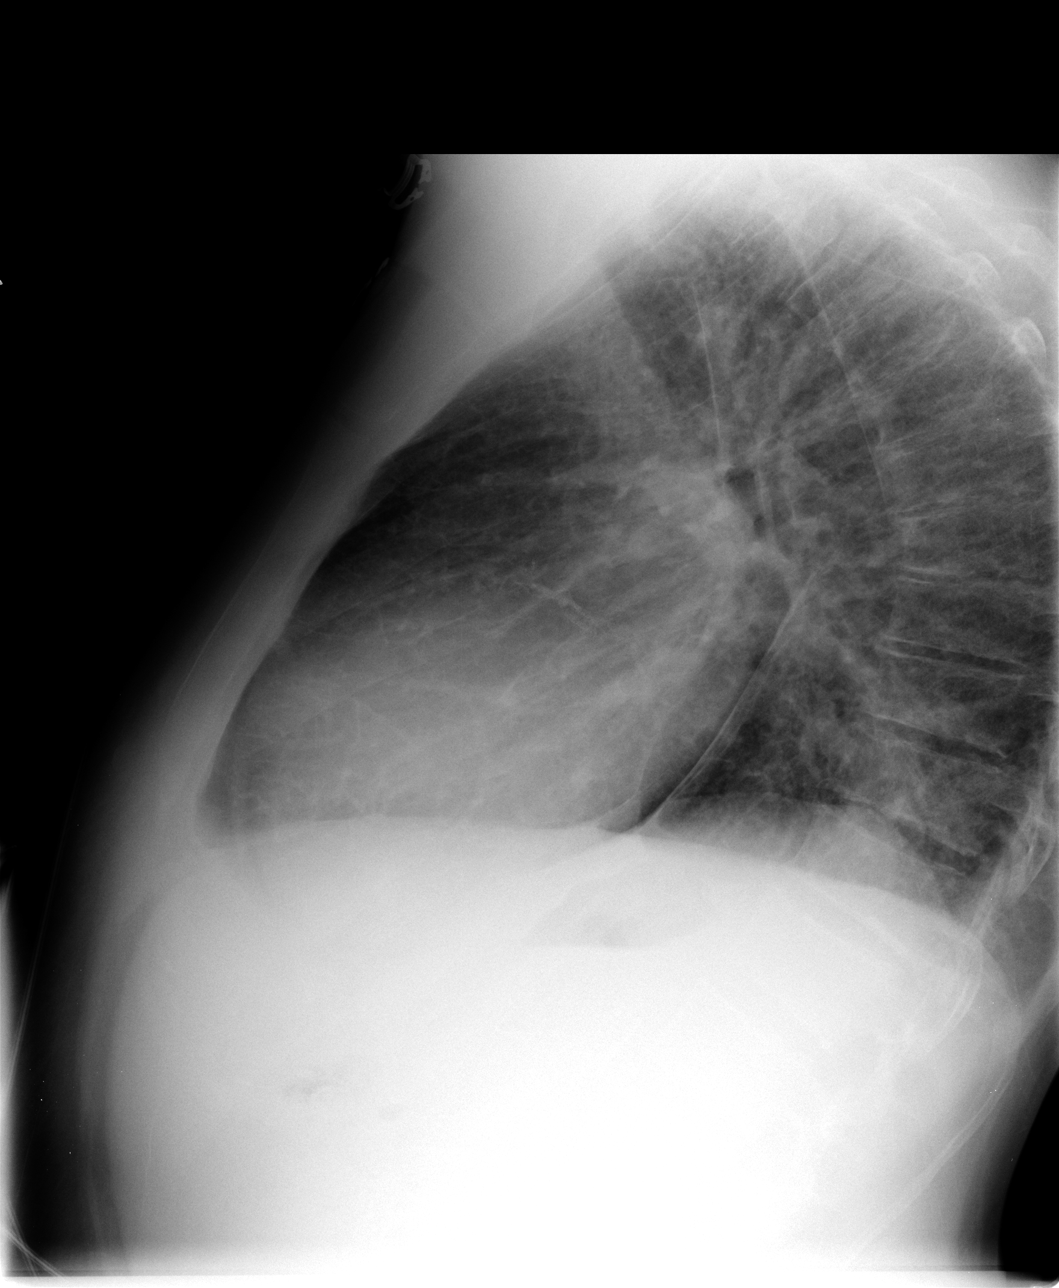

[3 of 3 positions shown; findings below may reference images not displayed]

FINDINGS: Stable moderate enlargement of the heart. Ectasia and
nonaneurysmal calcification of the thoracic aorta are seen.  No
pleural effusion is demonstrated but there is slight prominence of
the major fissures on lateral image which may reflect a tiny amount
of fluid or subpleural edema.

There is mild upper lobe vascular prominence of pulmonary venous
hypertension pattern and vascular congestion.  The right hilum is
less prominent than on the previous study.

The atelectasis and infiltrative density within the right lung base
on the AP image has improved since previous study with increased
aeration of the lung with only slight increased markings.  On the
lateral image  there is  atelectasis and patchy infiltrate density
in the posterior base.  No pneumothorax.
IMPRESSION: Stable moderate enlargement of the heart.  Pulmonary venous
hypertension vascular congestion pattern.  Prominence of major
fissures on lateral image may reflect tiny amount of pleural fluid
or subpleural edema.The atelectasis and infiltrative density within
the right lung base on the AP image has improved since previous
study with increased aeration of the lung with only slight
increased markings.  On the lateral image  there is  atelectasis
and patchy infiltrate density in the posterior base.

## 2012-08-22 IMAGING — CR DG ABDOMEN ACUTE W/ 1V CHEST
4 series · 4 of 4 positions shown · non-contrast
Comparison: 02/03/2011 and earlier studies

CLINICAL DATA: Abdominal pain and vomiting, hypertension, renal
failure

ACUTE ABDOMEN SERIES (ABDOMEN 2 VIEW & CHEST 1 VIEW)

[view not recorded (1 of 4)]
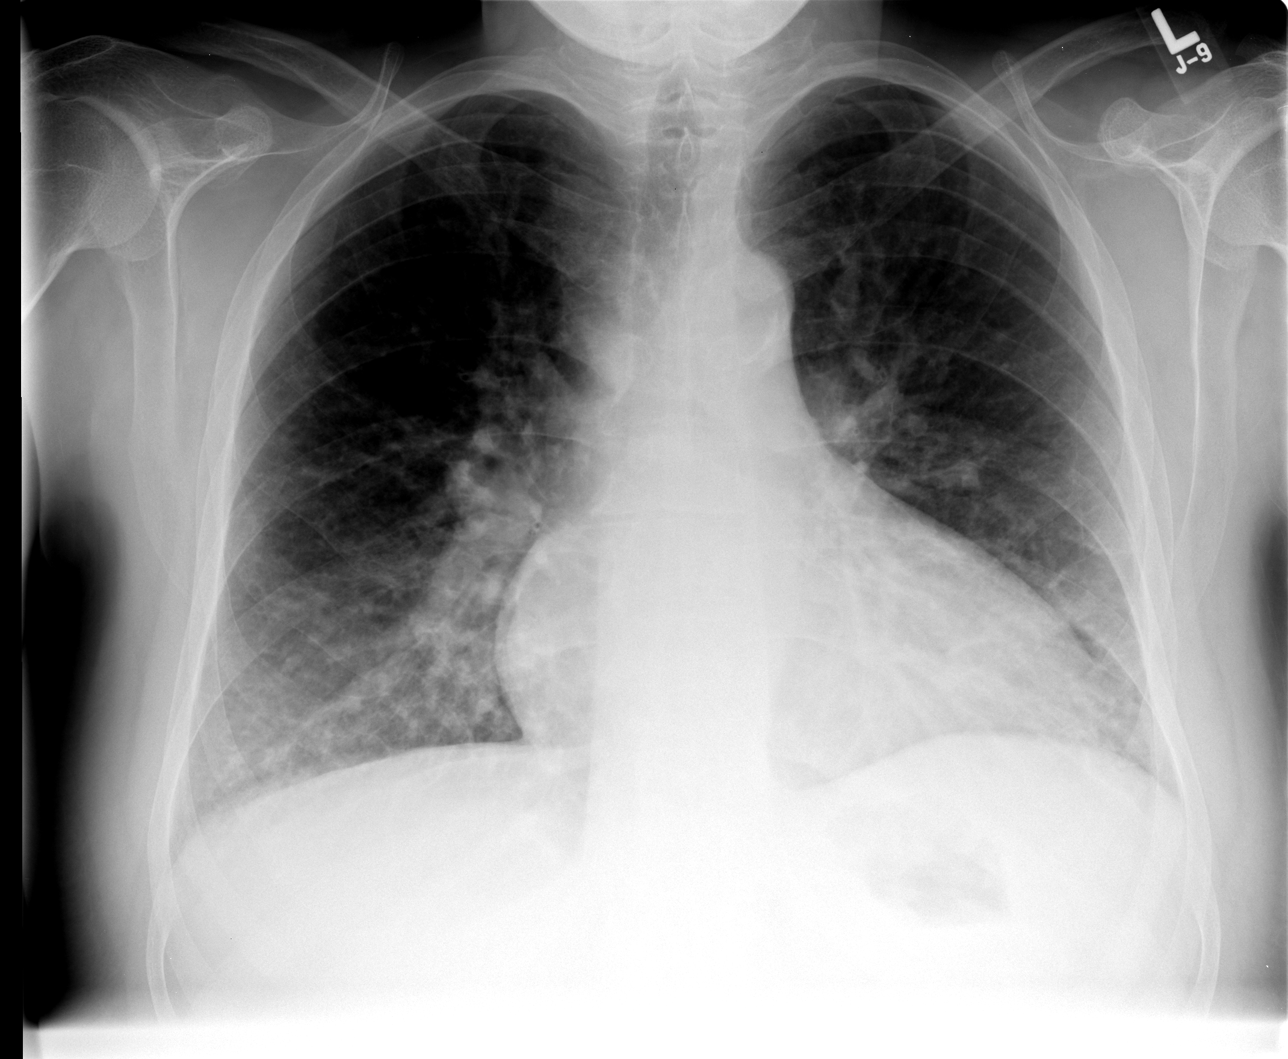

[view not recorded (2 of 4)]
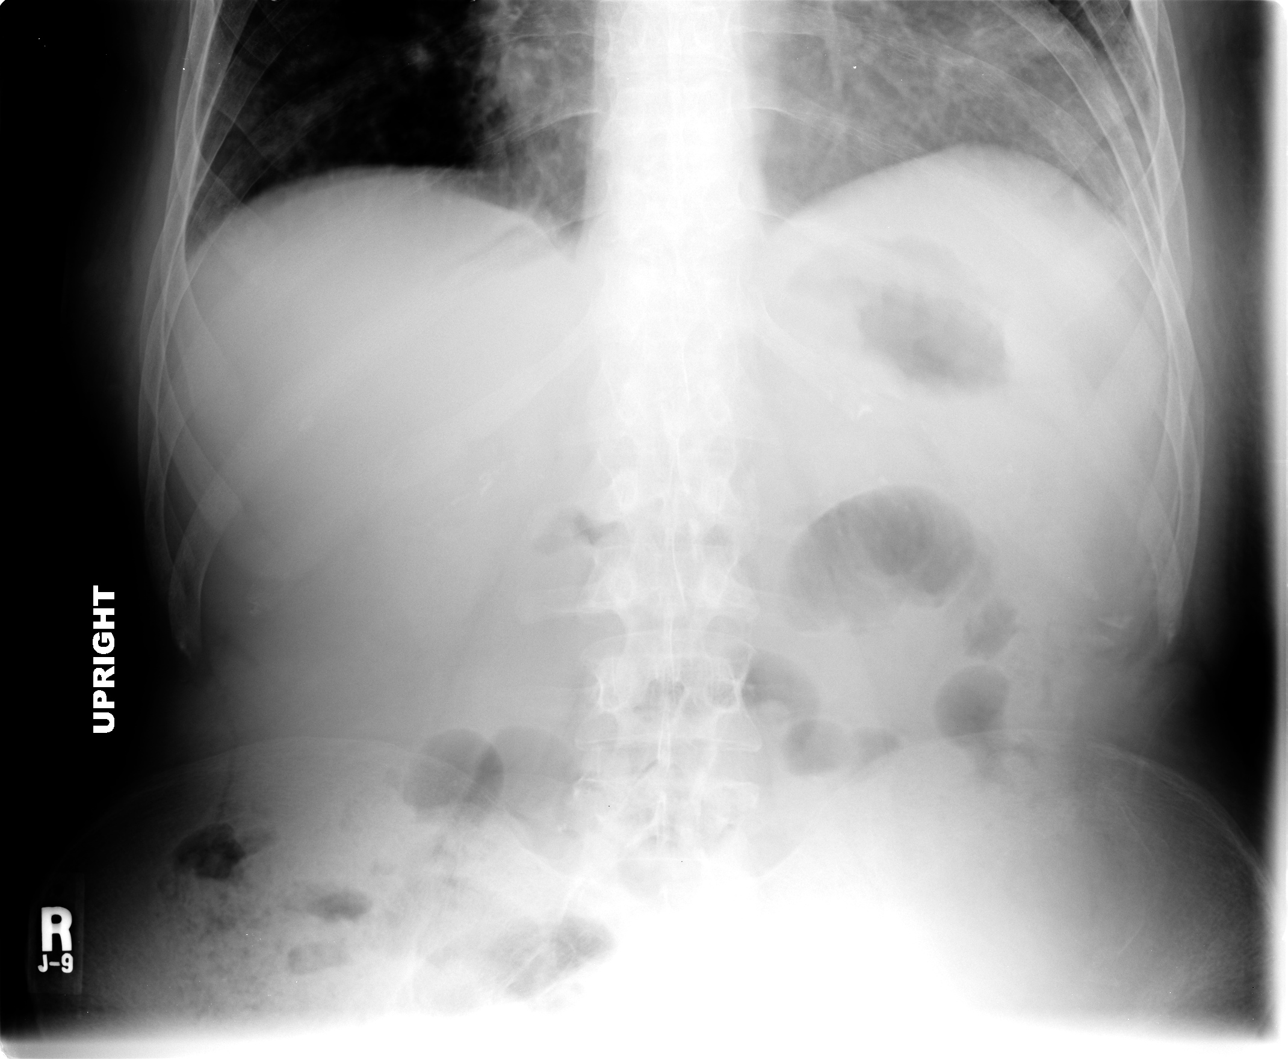

[view not recorded (3 of 4)]
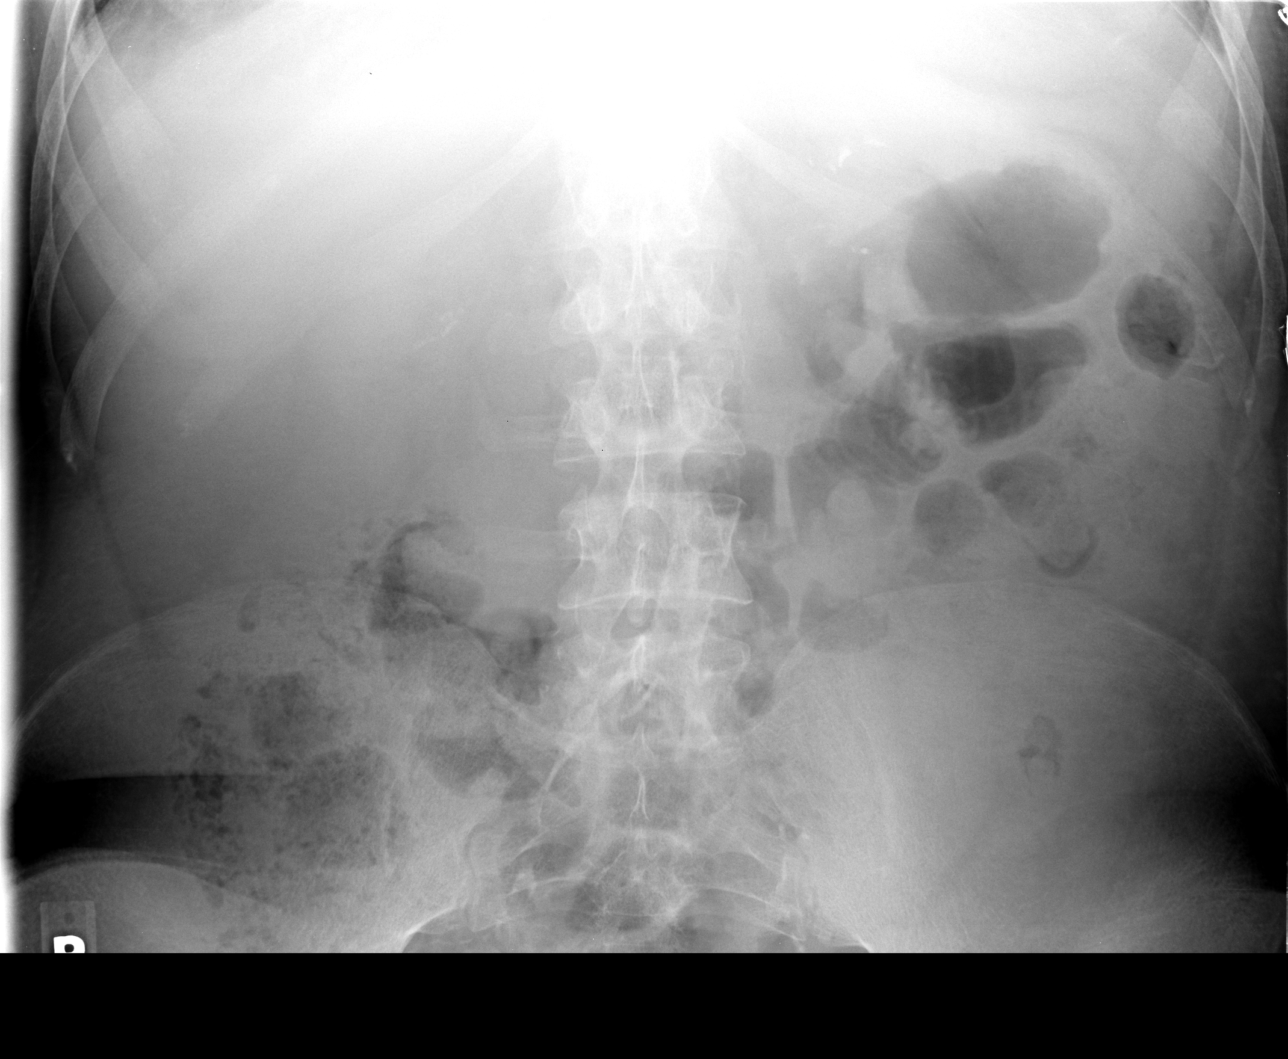

[view not recorded (4 of 4)]
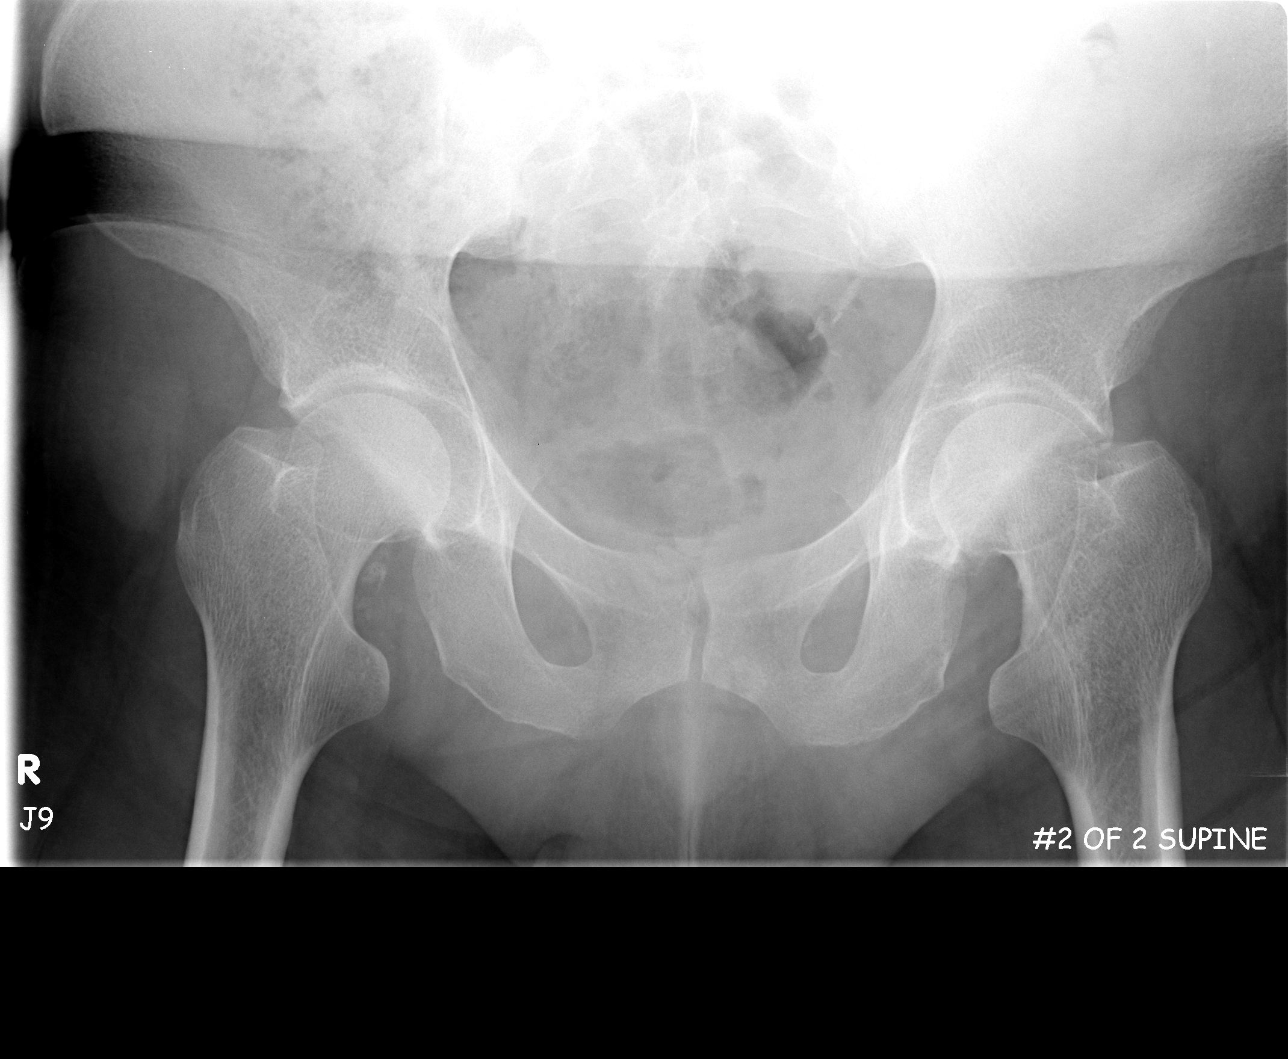

[4 of 4 positions shown; findings below may reference images not displayed]

FINDINGS: Persistent moderate cardiomegaly.  Mild perihilar and
bibasilar pulmonary vascular congestion stable.  No confluent
airspace infiltrate.  No effusion.

No free air on the erect film.

There are a few gas filled nondilated small bowel loops in the mid
abdomen.  Moderate fecal material in the proximal colon,
decompressed distally.  Vascular calcifications in the pelvis.
Calcified nondilated abdominal aorta.  Regional bones unremarkable.
IMPRESSION: 1.  Nonobstructive bowel gas pattern.
2.  No free air.
3.  Stable cardiomegaly and pulmonary vascular congestion.

## 2012-08-27 IMAGING — CT CT ABD-PELV W/O CM
2 of 4 series · 16 of 46 positions shown, 18 images · non-contrast
Comparison: Acute abdomen series of 02/07/2011.  CT of 10/18/2010

CLINICAL DATA: Right-sided flank pain.  Back pain.  CHF.  COPD.
Anxiety.  Hiatal hernia.  End-stage renal failure.

CT ABDOMEN AND PELVIS WITHOUT CONTRAST
TECHNIQUE: Multidetector CT imaging of the abdomen and pelvis was
performed following the standard protocol without intravenous
contrast.

[Series 2: abd|pel w/o 5.0 b40f · axial · non-contrast · 0.79mm/px · z∈[-438,-58]mm · 13 of 88 slices shown, 15 images]
[im 6/88  soft-tissue]
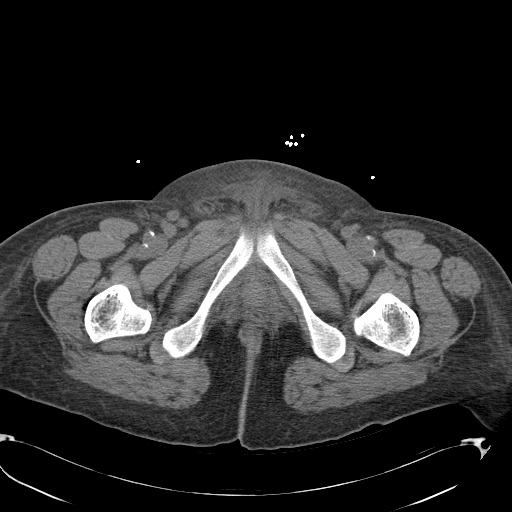
[im 6/88  bone]
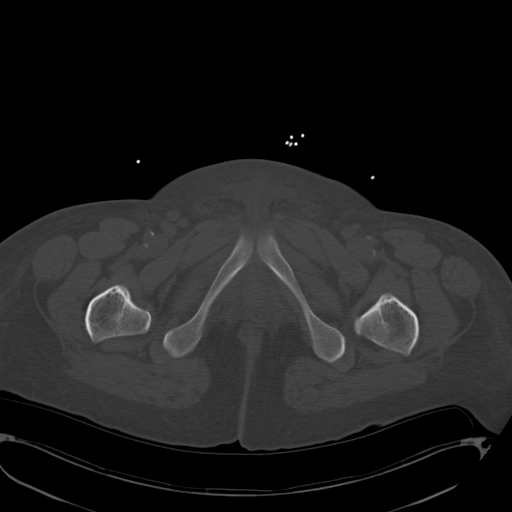
[im 12/88  soft-tissue]
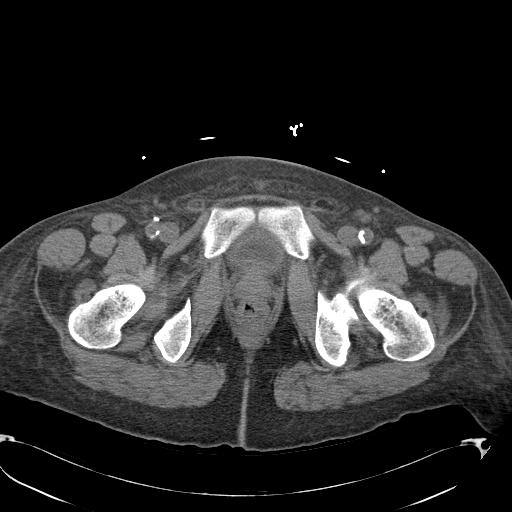
[im 18/88  soft-tissue]
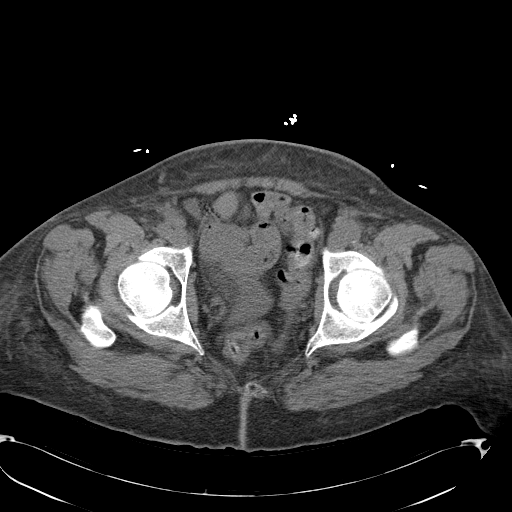
[im 24/88  soft-tissue]
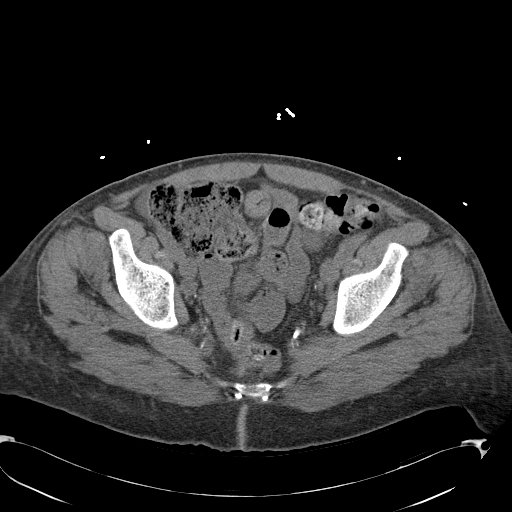
[im 30/88  soft-tissue]
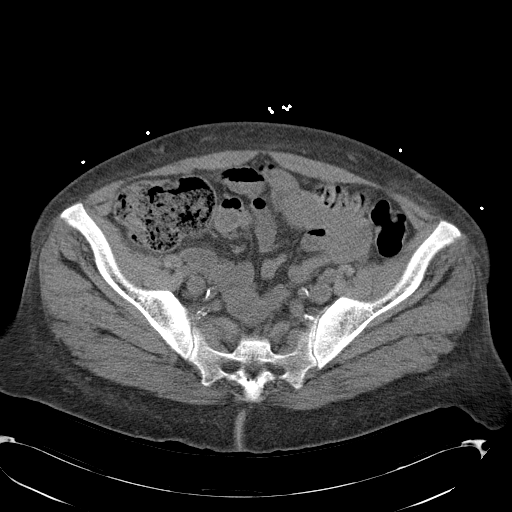
[im 35/88  soft-tissue]
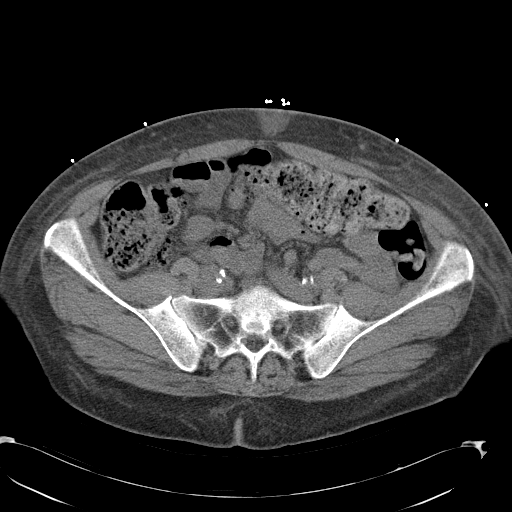
[im 47/88  soft-tissue]
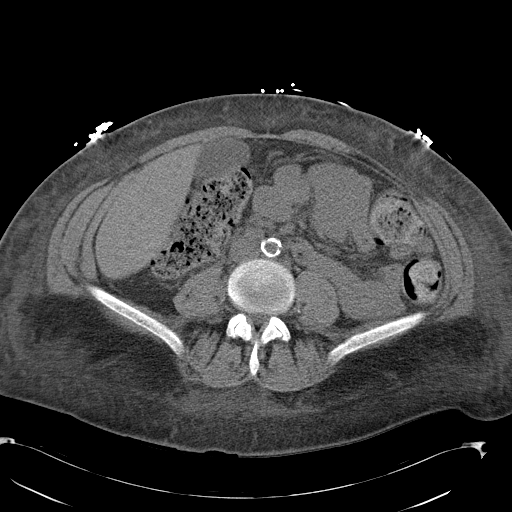
[im 53/88  soft-tissue]
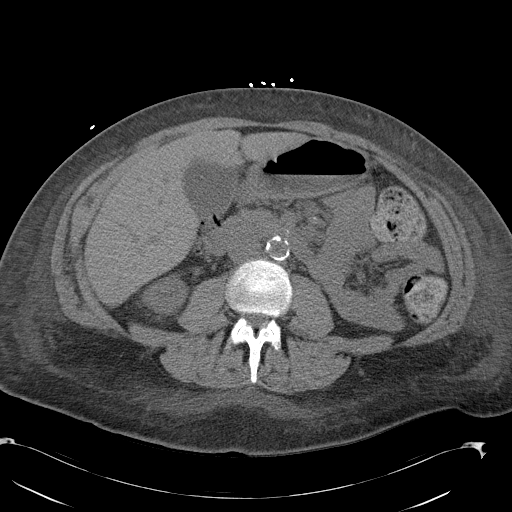
[im 59/88  soft-tissue]
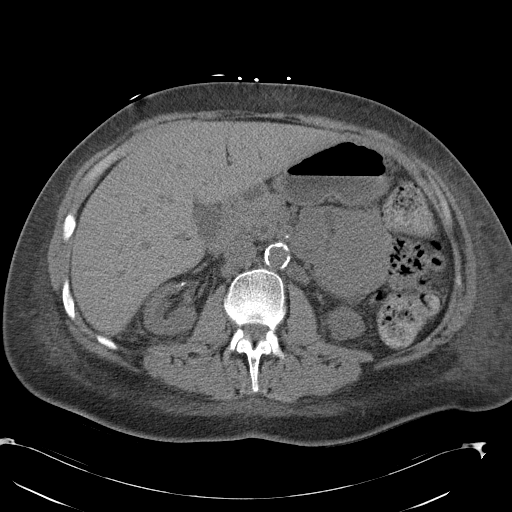
[im 59/88  bone]
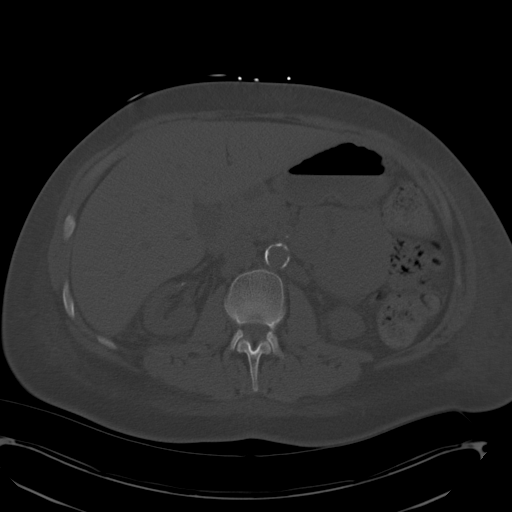
[im 64/88  soft-tissue]
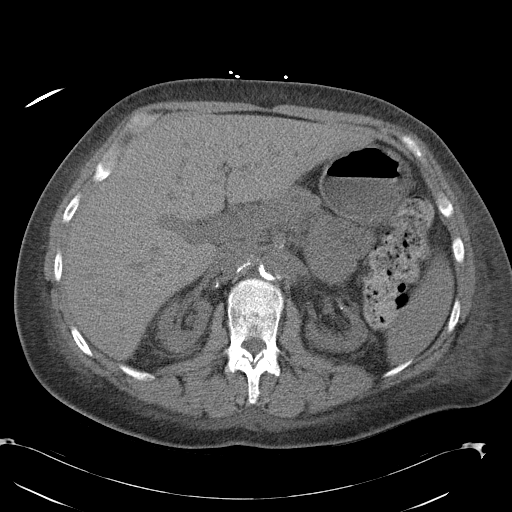
[im 70/88  soft-tissue]
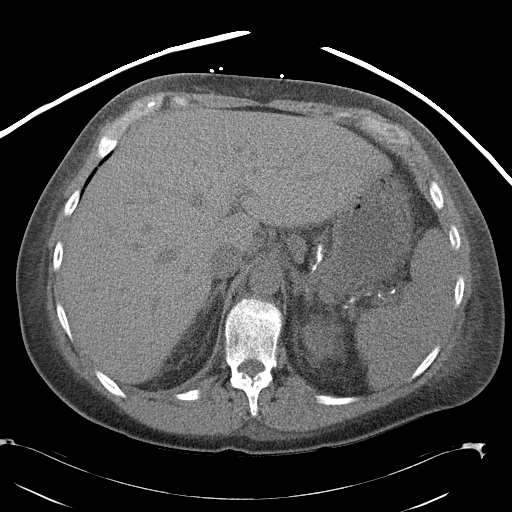
[im 76/88  soft-tissue]
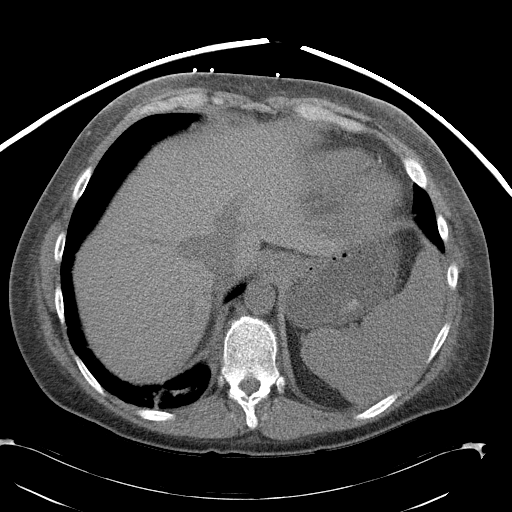
[im 82/88  soft-tissue]
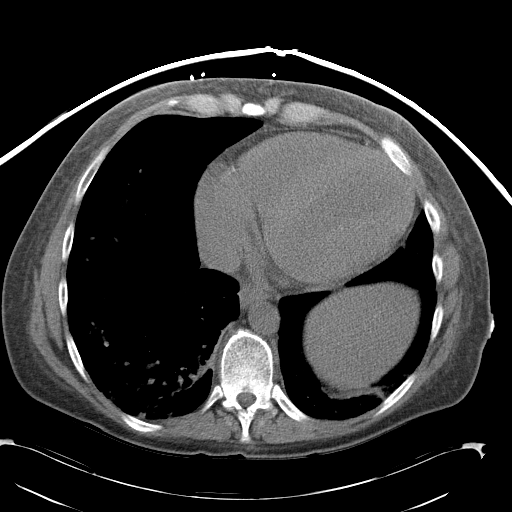

[Series 4: mpr cor (id) · coronal · 0.86mm/px · 3 of 110 slices shown]
[im 37/110  soft-tissue]
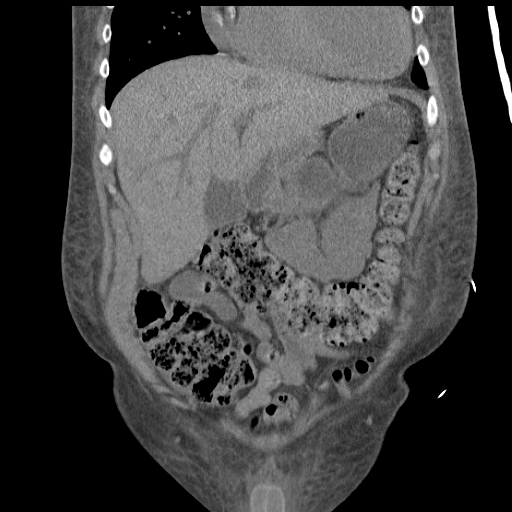
[im 49/110  soft-tissue]
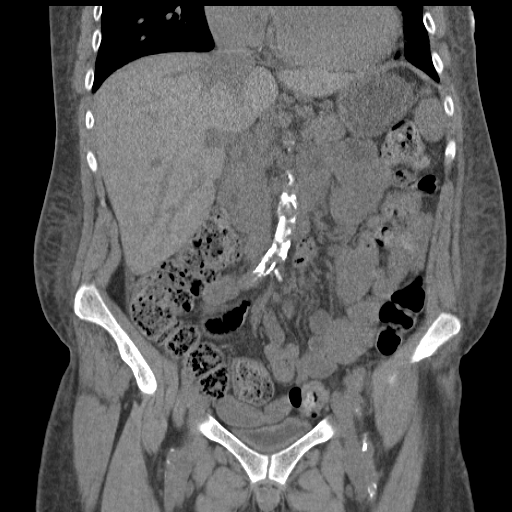
[im 61/110  soft-tissue]
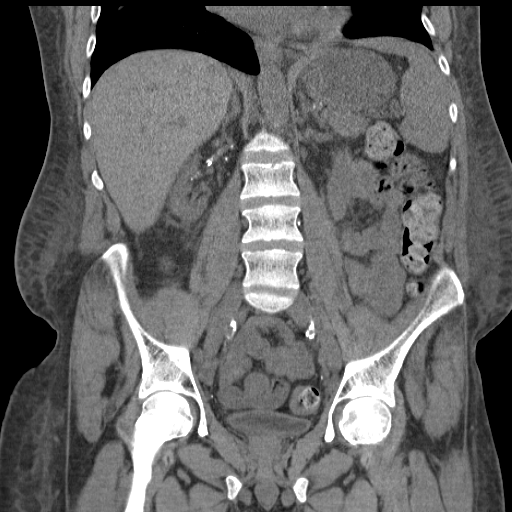

[16 of 46 positions shown; findings below may reference images not displayed]

FINDINGS: Interstitial thickening at the lung bases with patchy
bibasilar airspace disease.  Moderate cardiomegaly with coronary
artery atherosclerosis.  This is markedly age advanced.
Hepatomegaly.  Normal spleen, stomach, pancreas, gallbladder,
biliary tract, adrenal glands.  Markedly atrophic native kidneys.
Lower pole left renal lesion is likely a cyst.  An interpolar left
renal lesion measures 1 cm and greater than fluid density at 24 HU.

Markedly age advanced aortic atherosclerosis.  Mild left
retroperitoneal adenopathy 1.1 cm.  Stable and likely reactive.

Colonic stool burden suggests constipation.

No renal calculi or hydronephrosis.  Normal caliber of small bowel
loops.  No ascites or pneumatosis.  Mildly prominent pelvic
sidewall lymph nodes are unchanged and likely reactive. Normal
urinary bladder and prostate.  No significant free fluid.  Diffuse
anasarca.  Degenerative cyst in the left acetabulum.  Renal
osteodystrophy.  Subcutaneous air in the right anterior abdominal
wall is minimal and may relate to recent injections.
IMPRESSION: 1.  Degraded exam due lack of oral or IV contrast.  No renal
calculi or hydronephrosis.
2.  Probable constipation without other definite acute process in
the abdomen or pelvis.
3.  Patchy bibasilar airspace disease.  Favor atelectasis.
Correlate with any infectious symptoms.
4.  Borderline abdominal and retroperitoneal adenopathy.  Most
likely reactive.
5.  Left renal lesion which measures slightly greater than fluid
density.  Consider non emergent ultrasound characterization.
6. Markedly age advanced aortic and coronary atherosclerosis.

## 2012-08-27 IMAGING — CR DG CHEST 2V
2 series · 2 of 2 positions shown · non-contrast
Comparison: 02/09/2011 and earlier.

CLINICAL DATA: 34-year-old male with chest pain, shortness of
breath.

CHEST - 2 VIEW

[view not recorded (1 of 2)]
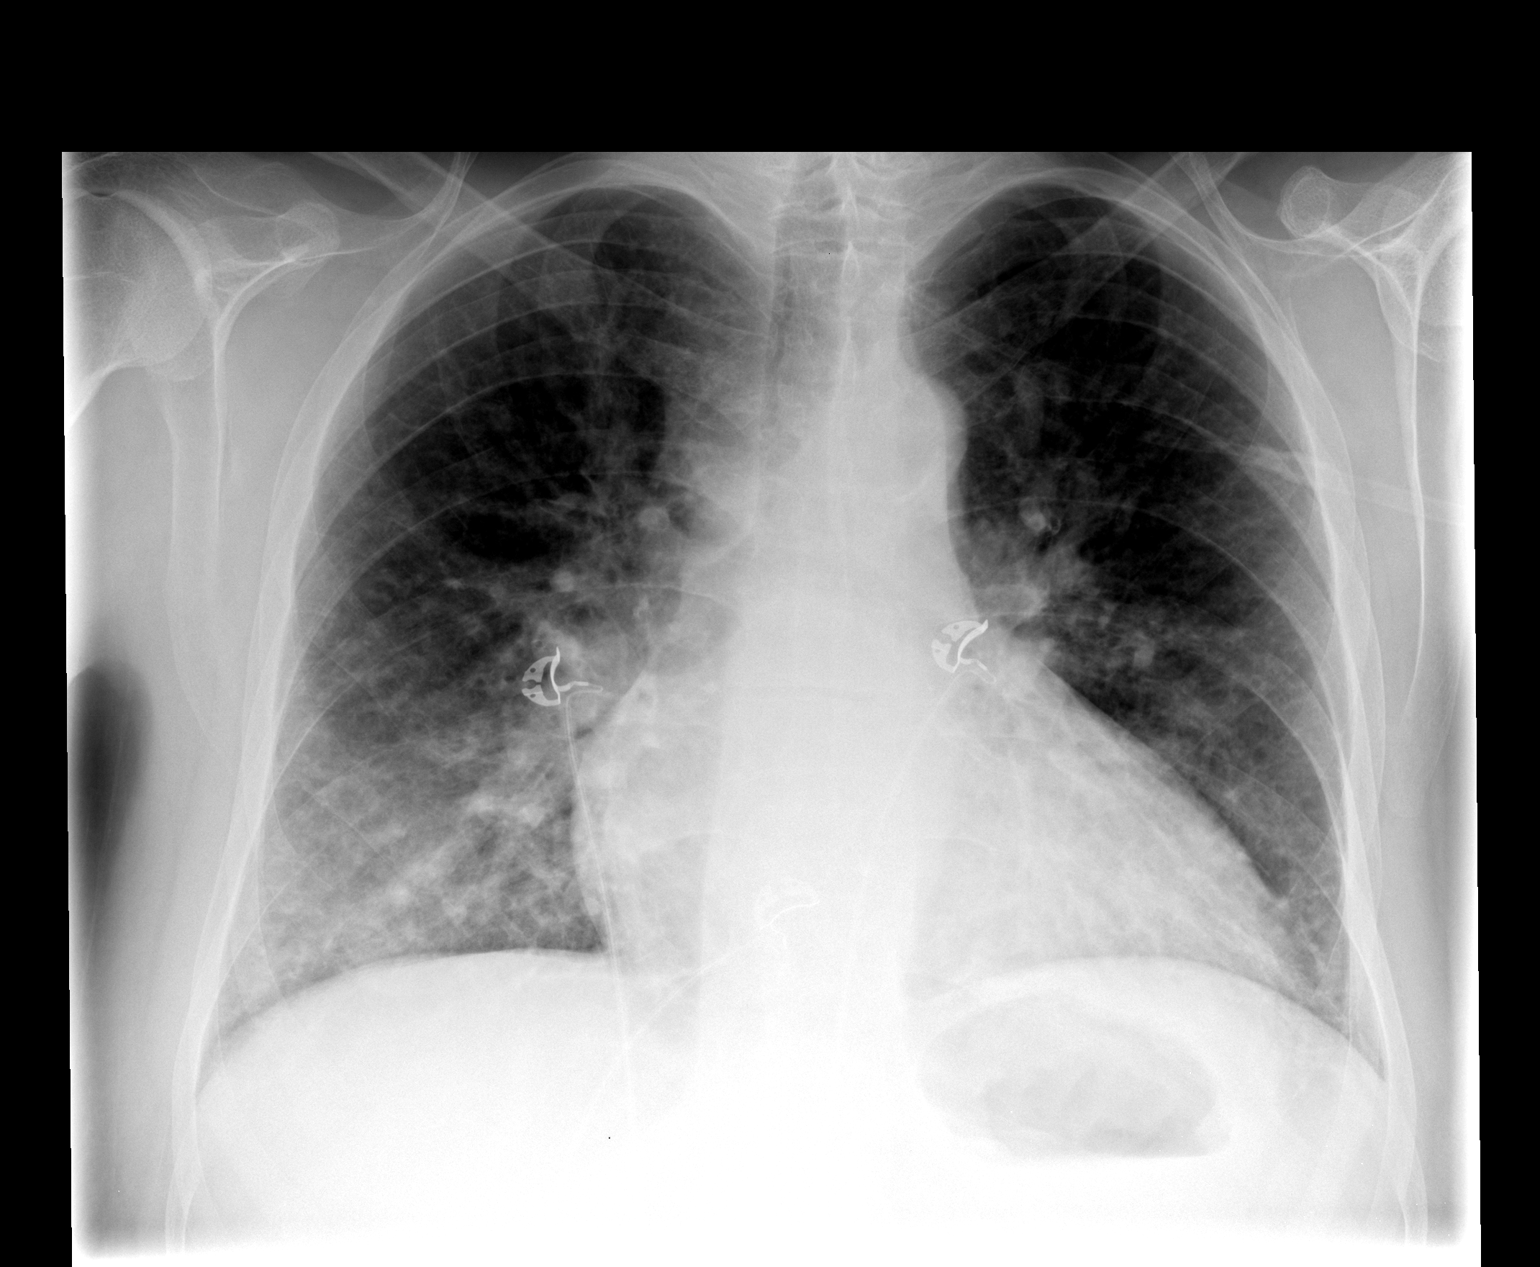

[view not recorded (2 of 2)]
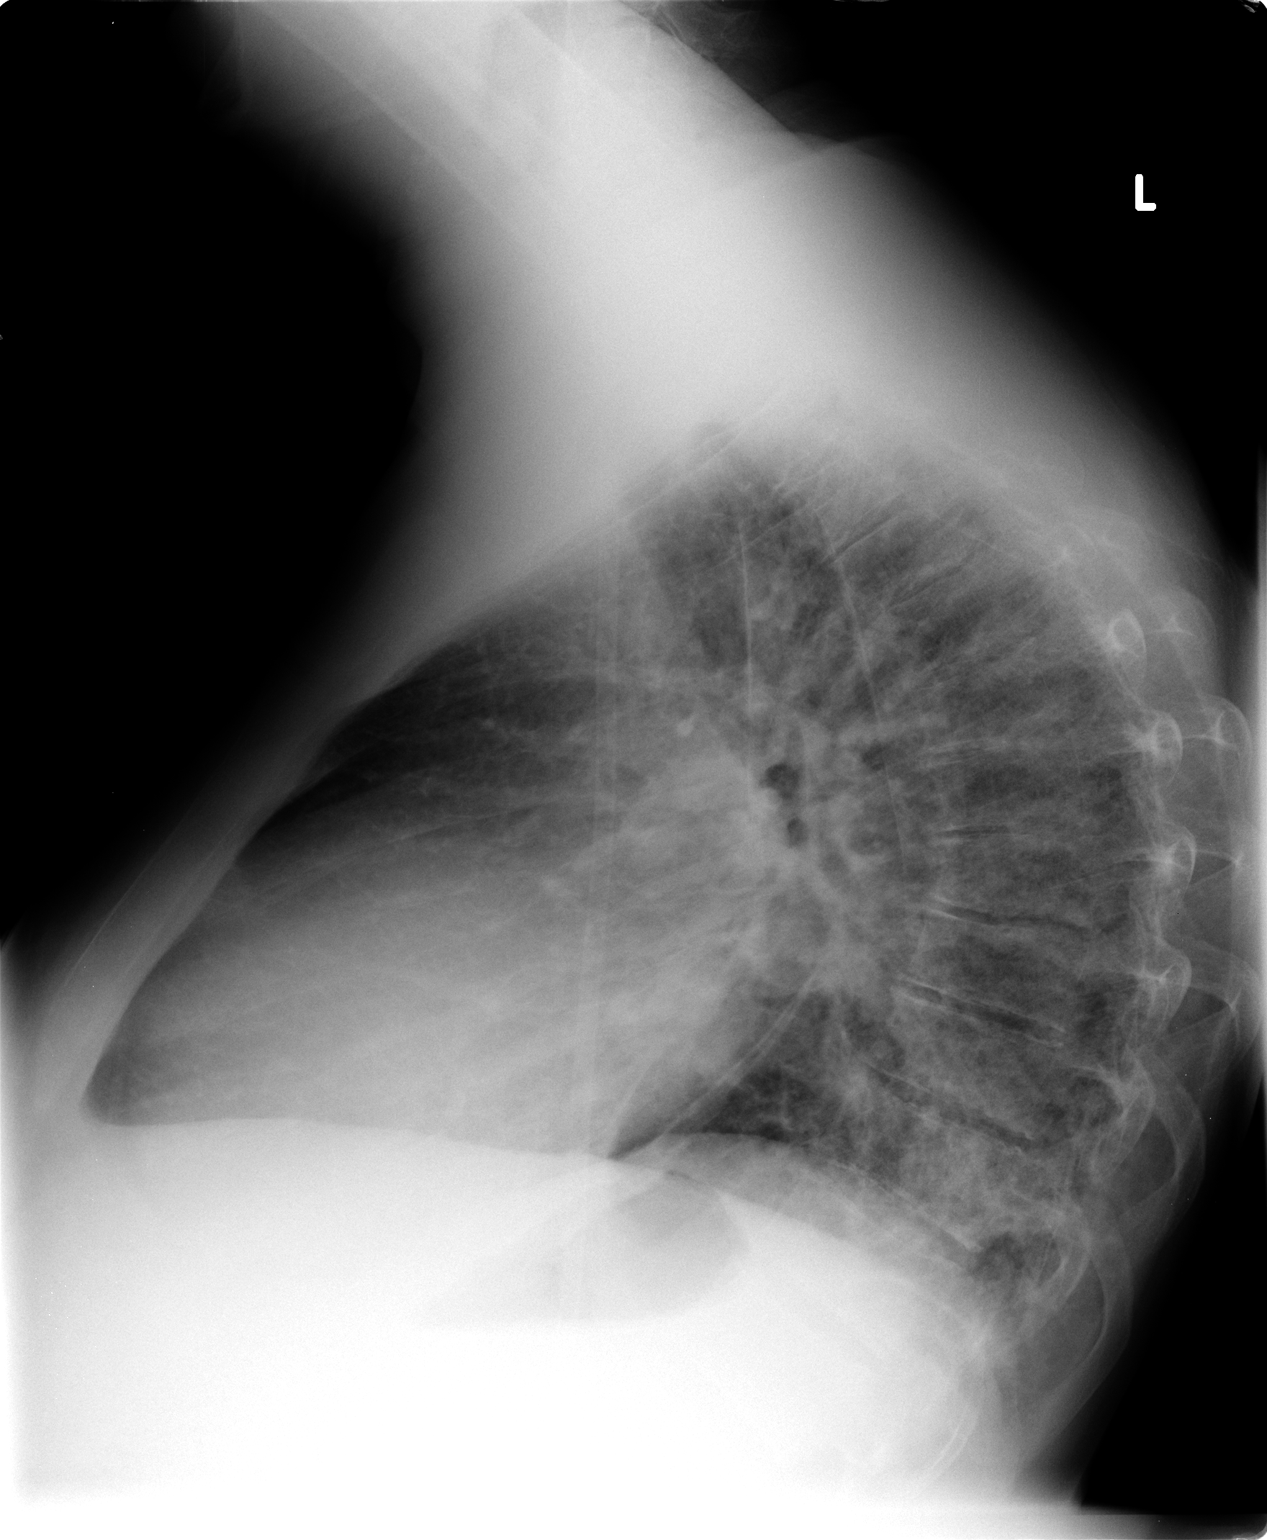

[2 of 2 positions shown; findings below may reference images not displayed]

FINDINGS: Stable cardiomegaly and mediastinal contours.  Stable
lung volumes.  Increased basilar predominant pulmonary vascular
congestion.  No pneumothorax, pleural effusion or consolidation.
Stable visualized osseous structures.
IMPRESSION: Mild/developing pulmonary interstitial edema.  Stable cardiomegaly.

## 2012-08-28 IMAGING — CR DG CHEST 1V PORT
1 series · 1 of 1 positions shown · non-contrast
Comparison: [DATE]

CLINICAL DATA: Wheezing.  Chest pain.

PORTABLE CHEST - 1 VIEW

[view not recorded]
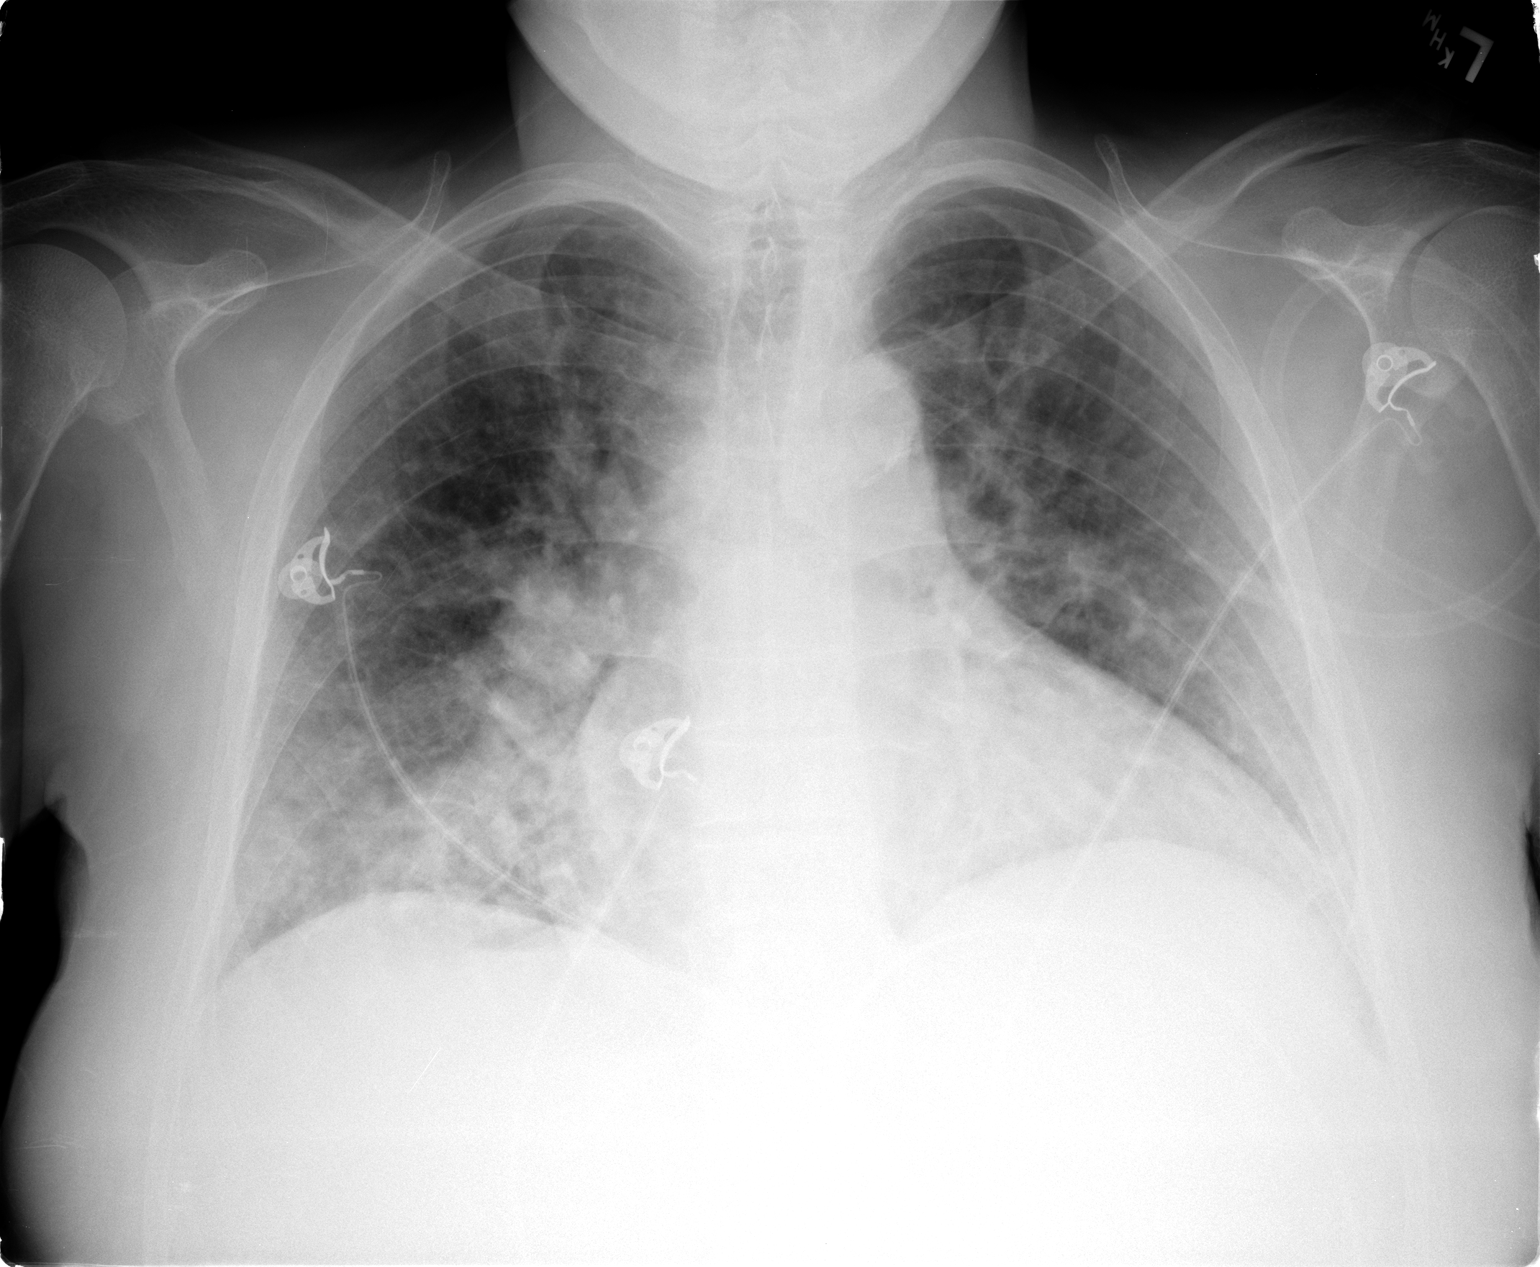

[1 of 1 positions shown; findings below may reference images not displayed]

FINDINGS: Cardiomegaly persist.  There is worsening density of both
lower lobes, right more than left, consistent with pneumonia.
There could be a degree of coexistent pulmonary edema as well, but
the focality in the right lower lung is particular worrisome for
pneumonia.
IMPRESSION: Worsening density in the right lower lobe consistent with
pneumonia.  Pulmonary venous hypertension and early edema persists
as well.

## 2012-08-28 IMAGING — CR DG CHEST 1V PORT
1 series · 1 of 1 positions shown · non-contrast
Comparison: 02/13/2011 at [DATE] a.m.

CLINICAL DATA: Shortness of breath.

PORTABLE CHEST - 1 VIEW

[view not recorded]
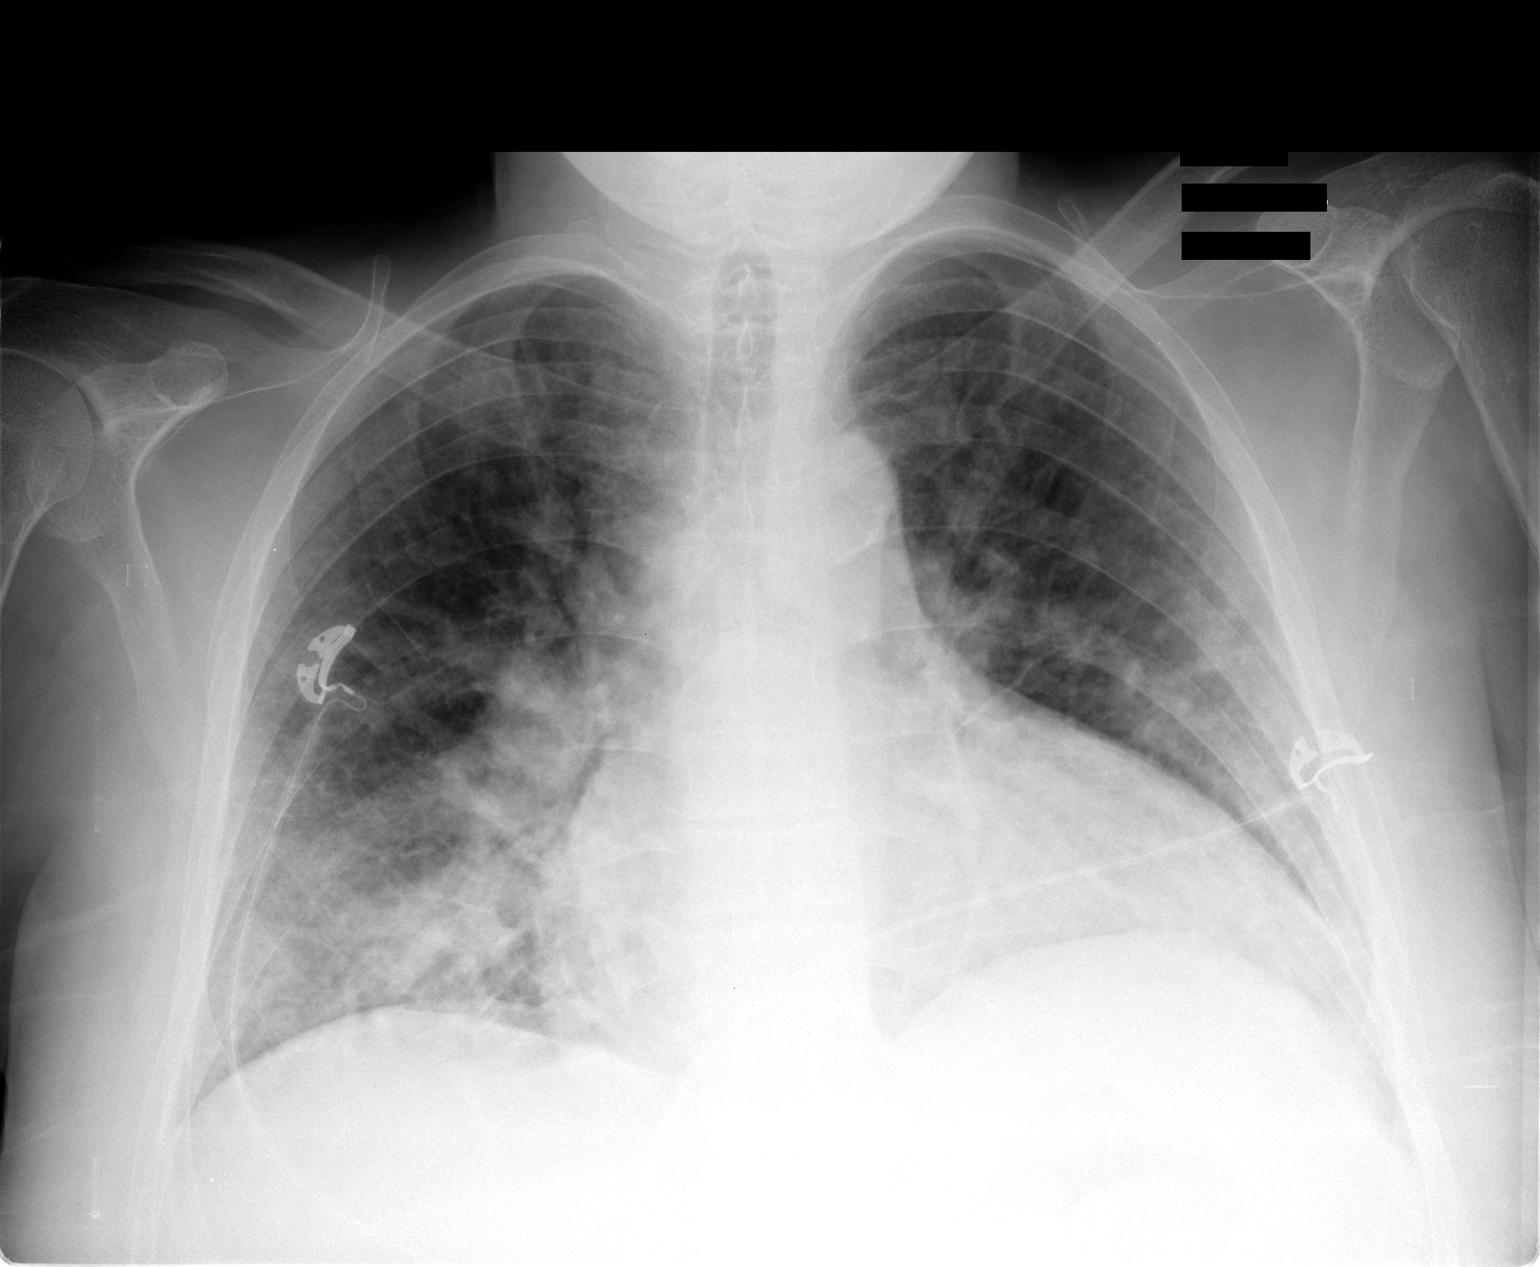

[1 of 1 positions shown; findings below may reference images not displayed]

FINDINGS: There is persistent cardiomegaly and pulmonary vascular
congestion.  Infiltrate persists at the right base and there are
patchy densities in the perihilar regions bilaterally.  The finding
may represent pneumonia or pulmonary edema.
IMPRESSION: No significant change in the appearance of the chest.  Persistent
cardiomegaly and infiltrates which could be edema or pneumonia.

## 2012-09-08 IMAGING — CR DG CHEST 1V PORT
1 series · 1 of 1 positions shown · non-contrast
Comparison: Chest 02/13/2011.

CLINICAL DATA: Shortness of breath.  Congestive heart failure.

PORTABLE CHEST - 1 VIEW

[view not recorded]
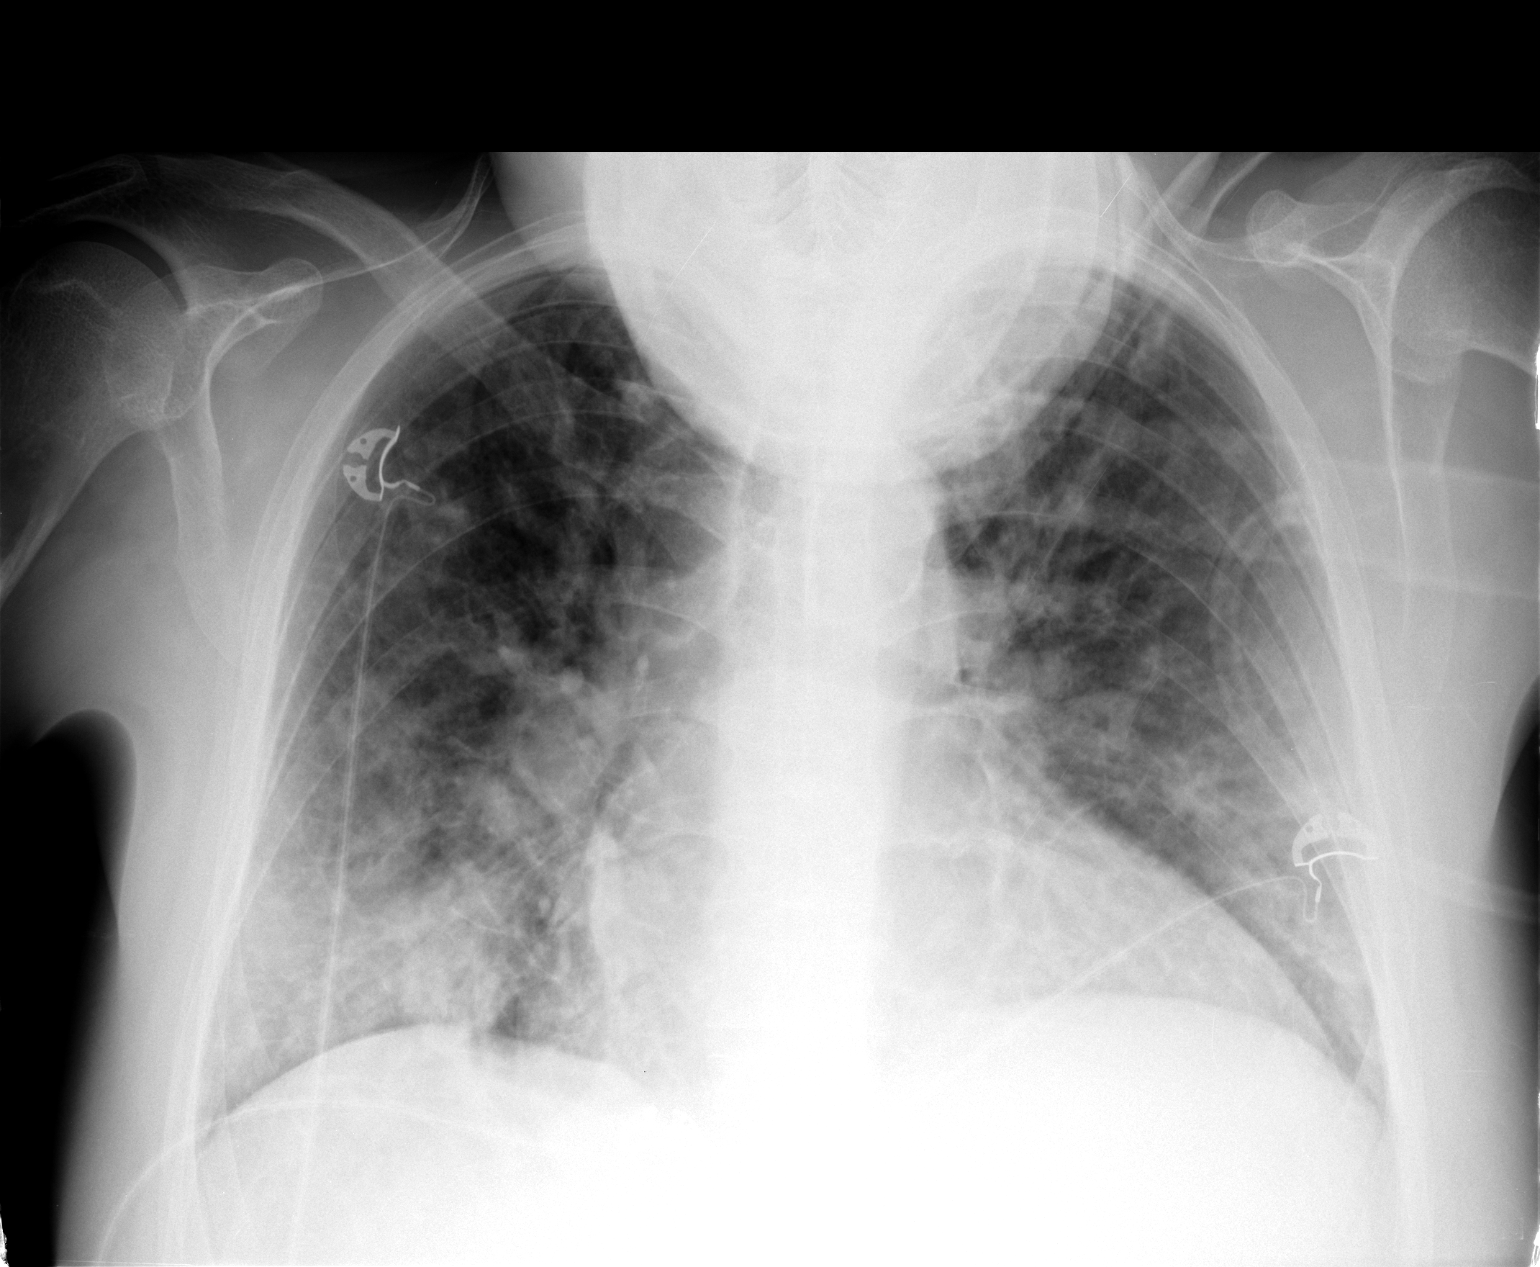

[1 of 1 positions shown; findings below may reference images not displayed]

FINDINGS: As on the prior examination is cardiomegaly and right
worse left airspace disease without marked change.  No pneumothorax
or pleural effusion.
IMPRESSION: No change in cardiomegaly and right worse left airspace disease
which could represent chronic edema.  Pneumonia could create a
similar appearance.

## 2012-09-11 IMAGING — CR DG CHEST 2V
3 series · 3 of 3 positions shown · non-contrast
Comparison: 02/24/2011

CLINICAL DATA: Shortness of breath.

CHEST - 2 VIEW

[view not recorded (1 of 3)]
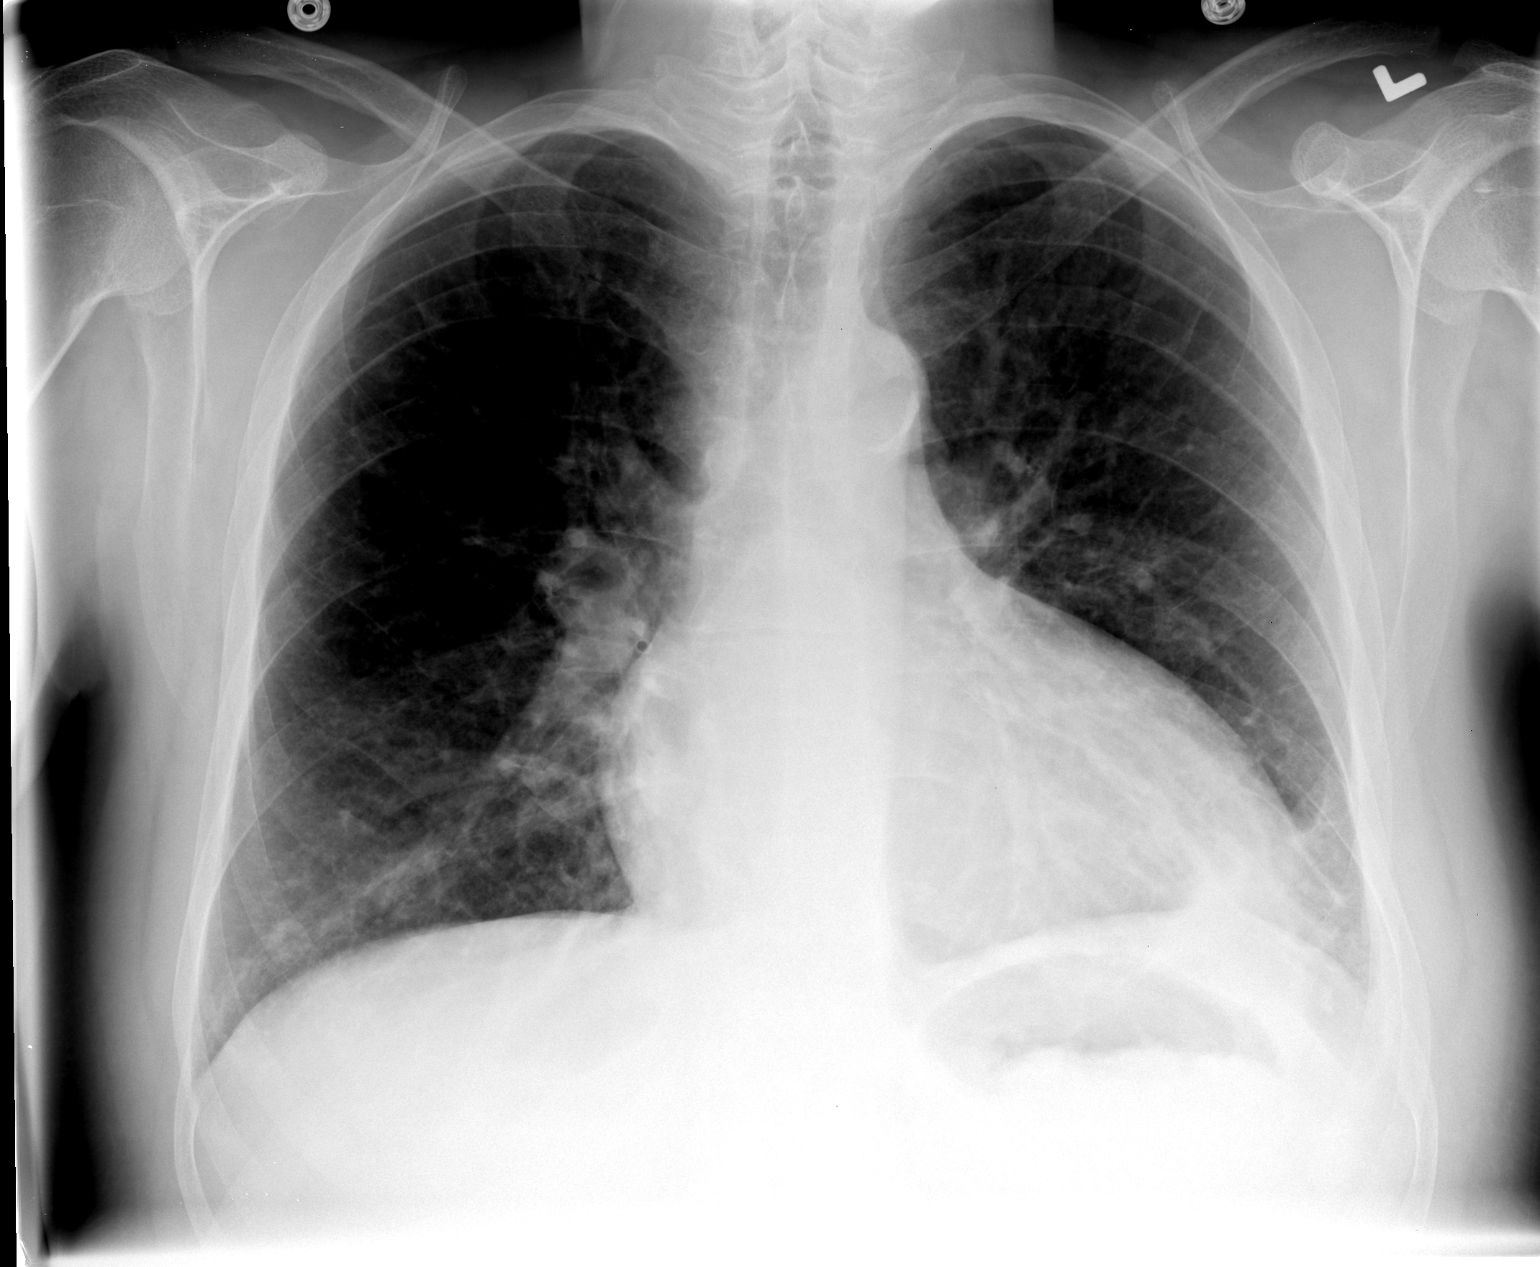

[view not recorded (2 of 3)]
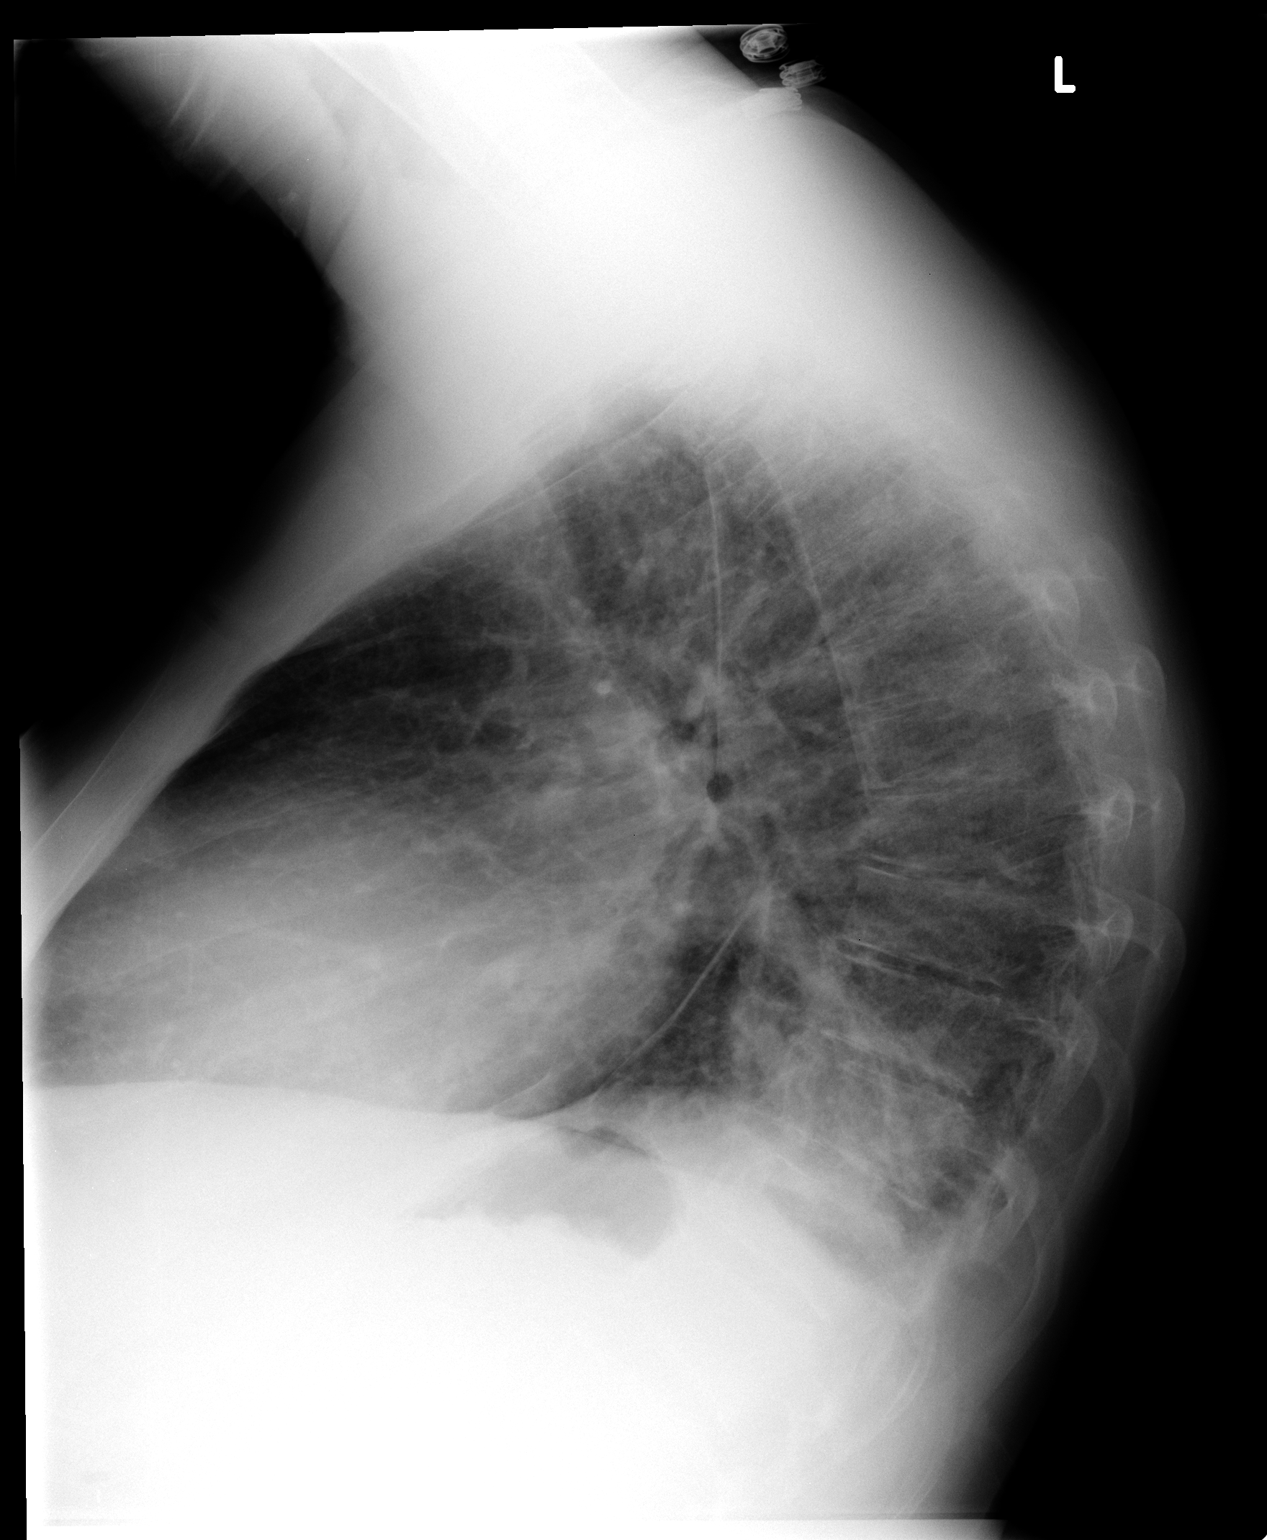

[view not recorded (3 of 3)]
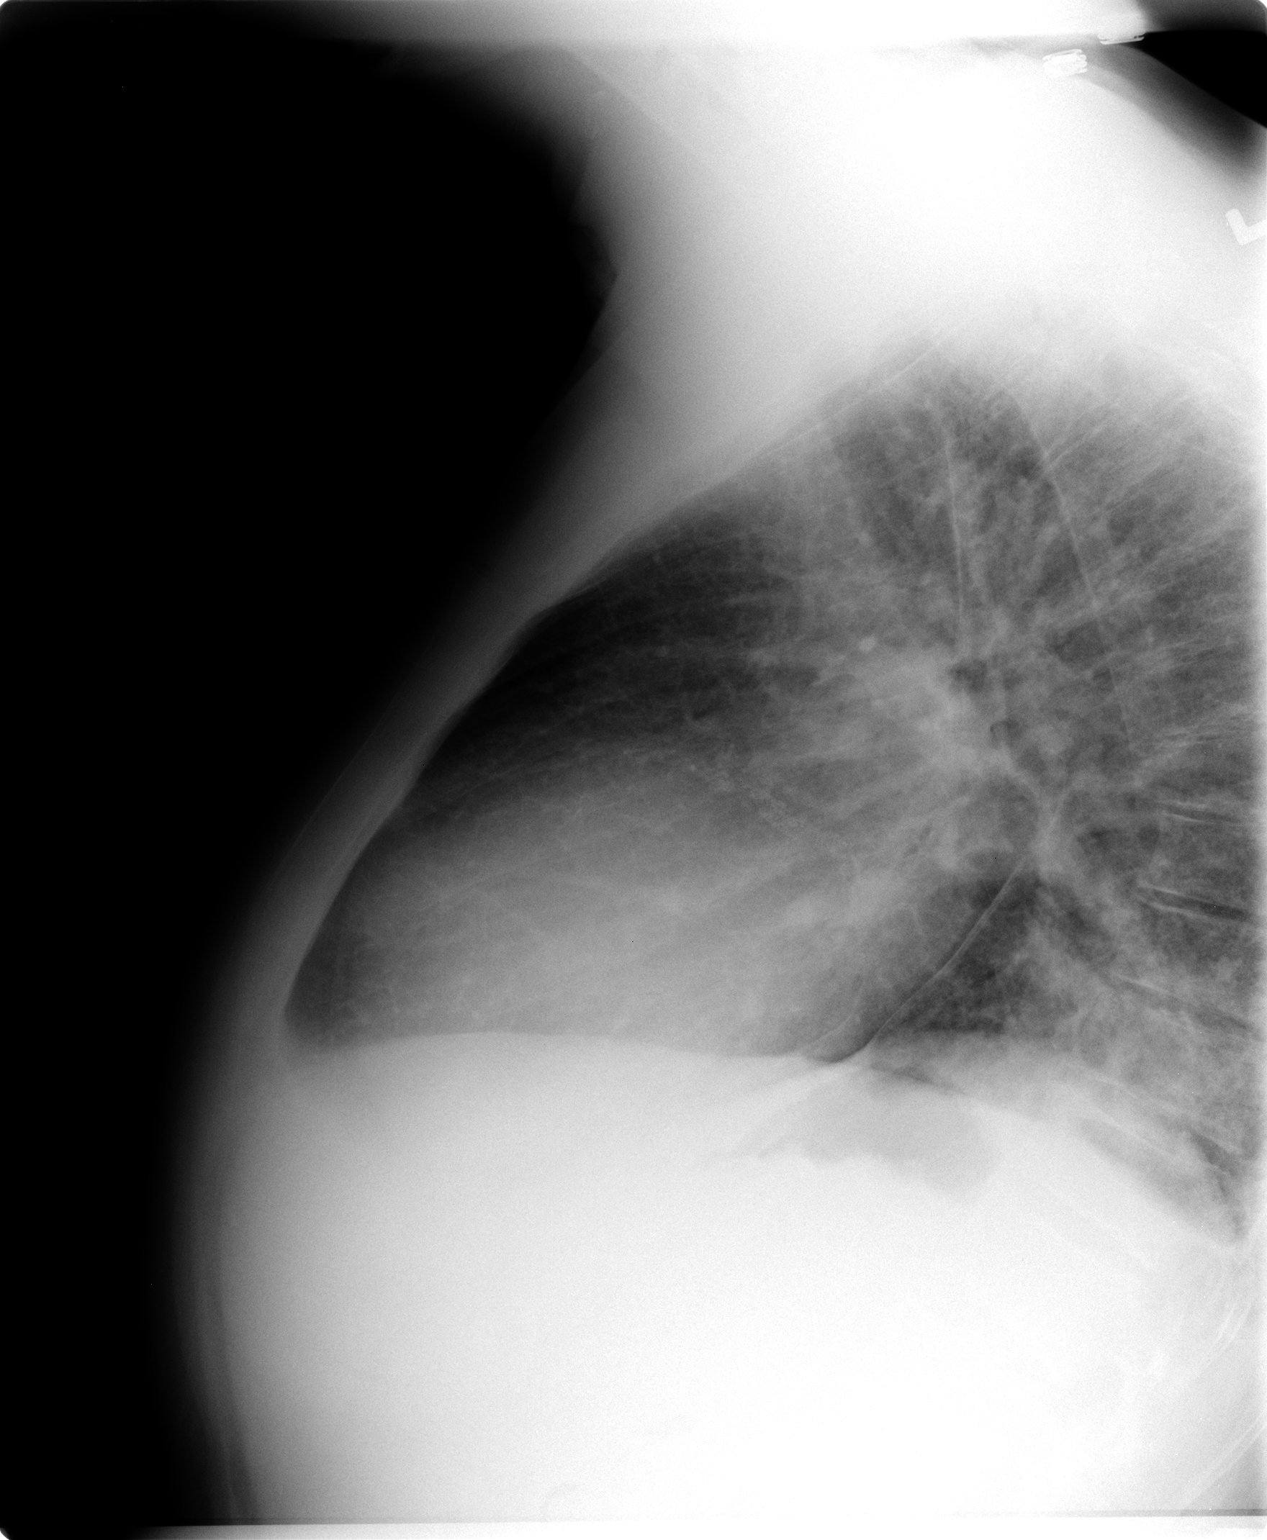

[3 of 3 positions shown; findings below may reference images not displayed]

FINDINGS: The patchy bilateral infiltrates have resolved on the
right. There is a new dense area of infiltrate posteriorly in the
left lung base.  There is chronic cardiomegaly.  The vascularity is
at the upper limits of normal.
IMPRESSION: 1.  Clearing of the patchy infiltrates.
2.  New consolidative infiltrate posteriorly at the left base.

## 2012-09-13 IMAGING — CR DG CHEST 2V
2 series · 2 of 2 positions shown · non-contrast
Comparison: 02/27/2011

CLINICAL DATA: Cough.

CHEST - 2 VIEW

[view not recorded (1 of 2)]
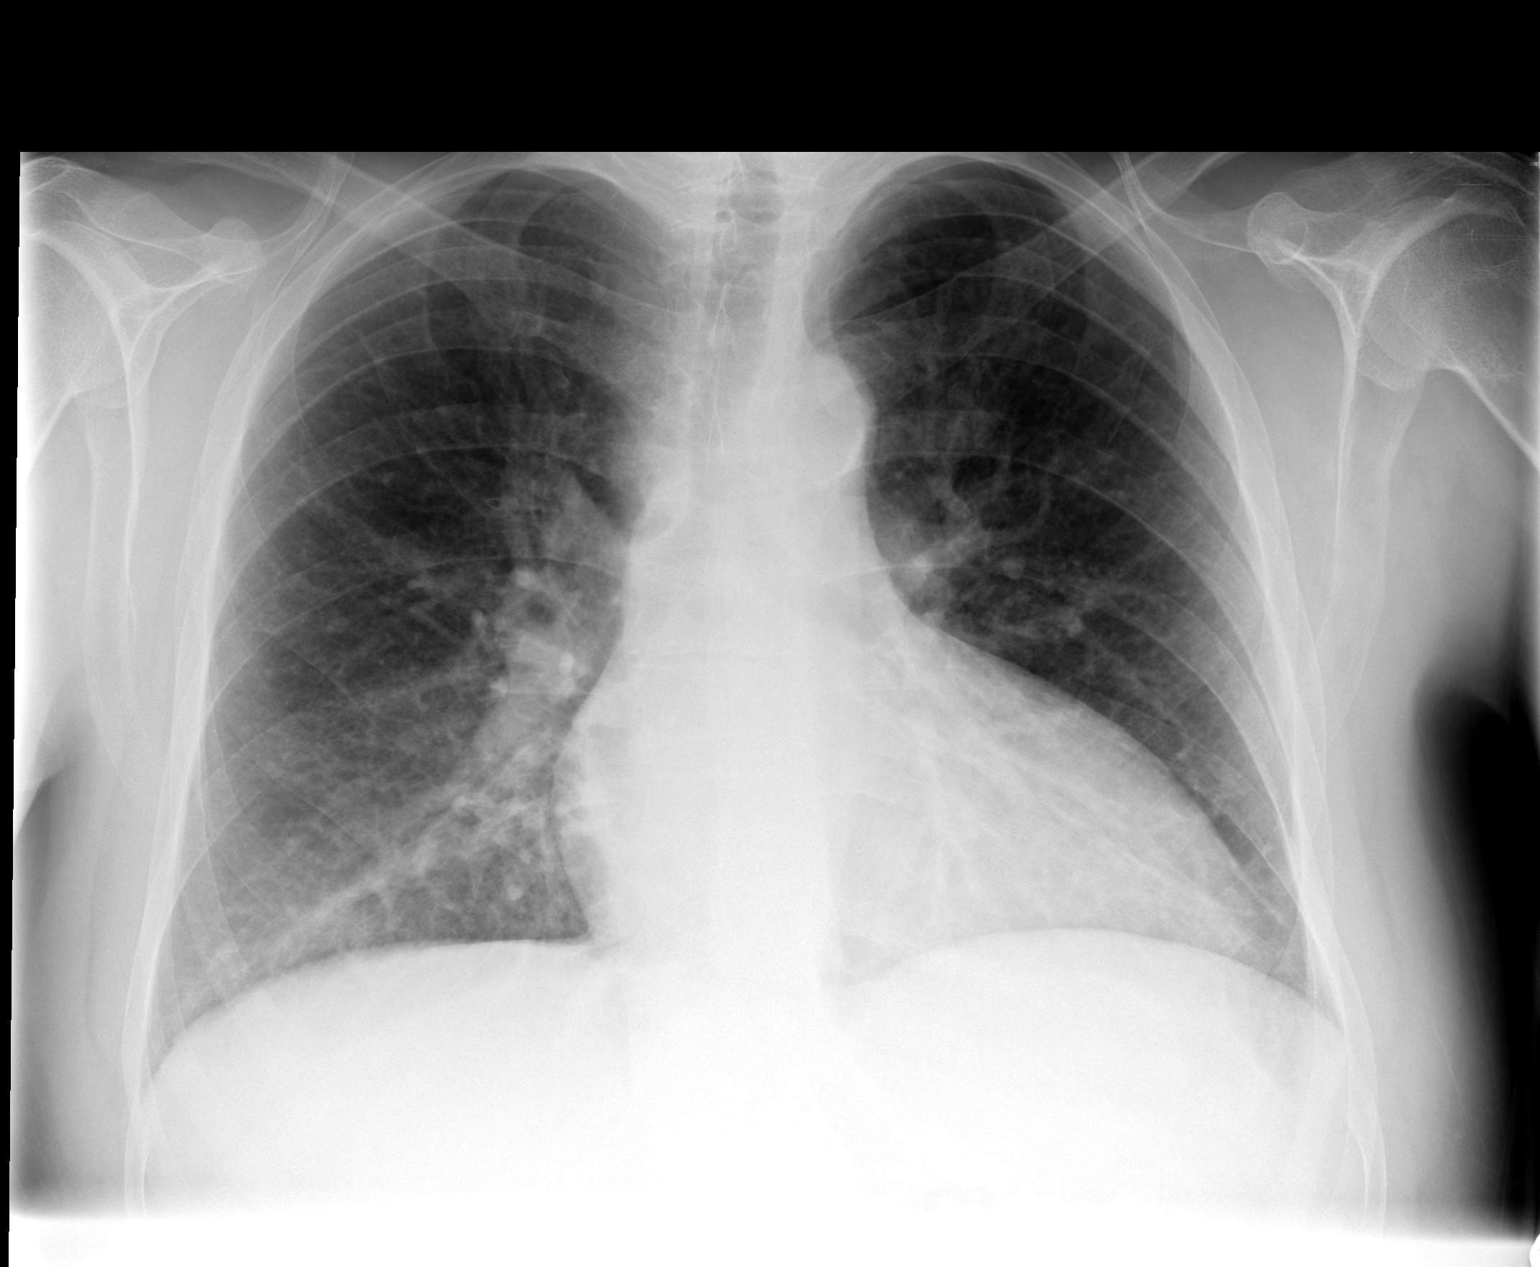

[view not recorded (2 of 2)]
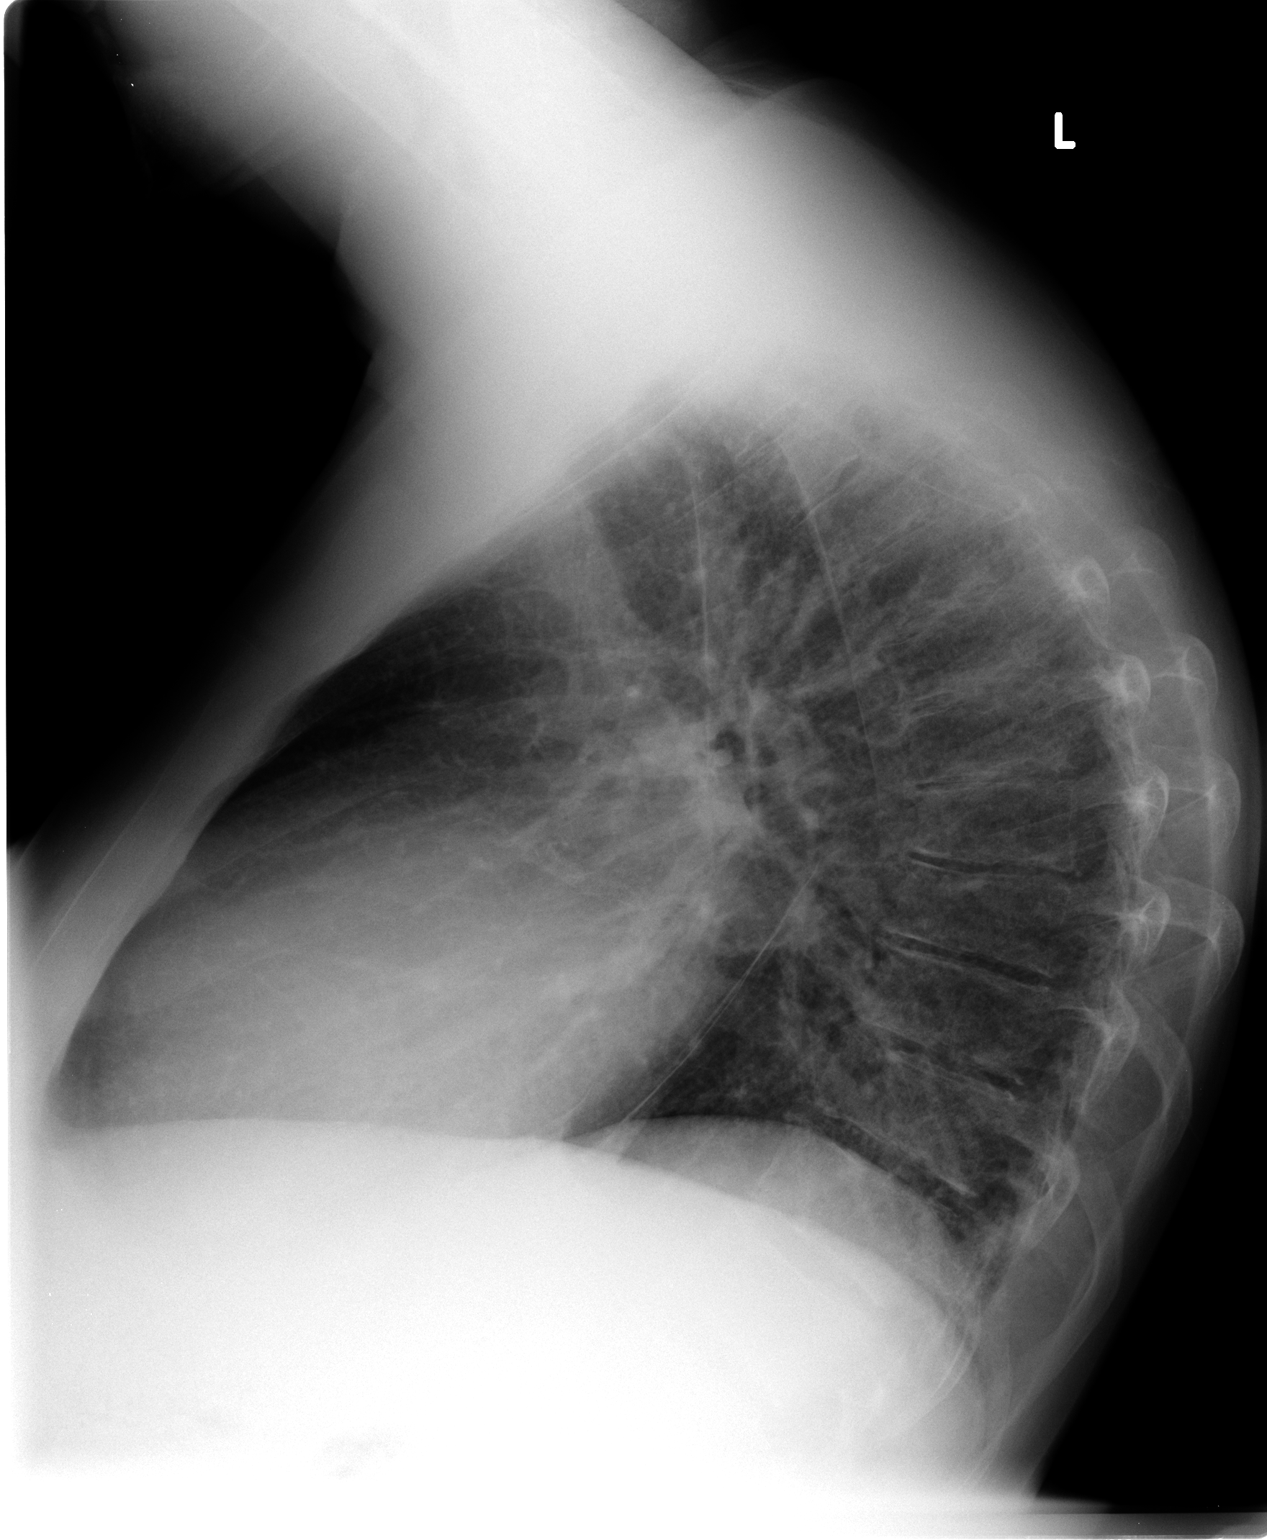

[2 of 2 positions shown; findings below may reference images not displayed]

FINDINGS: There is chronic cardiomegaly.  The focal infiltrate at
the left base seen on the prior study has completely resolved.  The
lungs are clear.  Vascularity has slightly improved.  No effusions.
IMPRESSION: Complete clearing of the infiltrate at the left lung base.
Persistent cardiomegaly.  Mild vascular prominence.

## 2012-10-03 IMAGING — CR DG CHEST 2V
2 series · 2 of 2 positions shown · non-contrast
Comparison: 03/01/2011

CLINICAL DATA: Shortness of breath.  COPD and CHF.

CHEST - 2 VIEW

[view not recorded (1 of 2)]
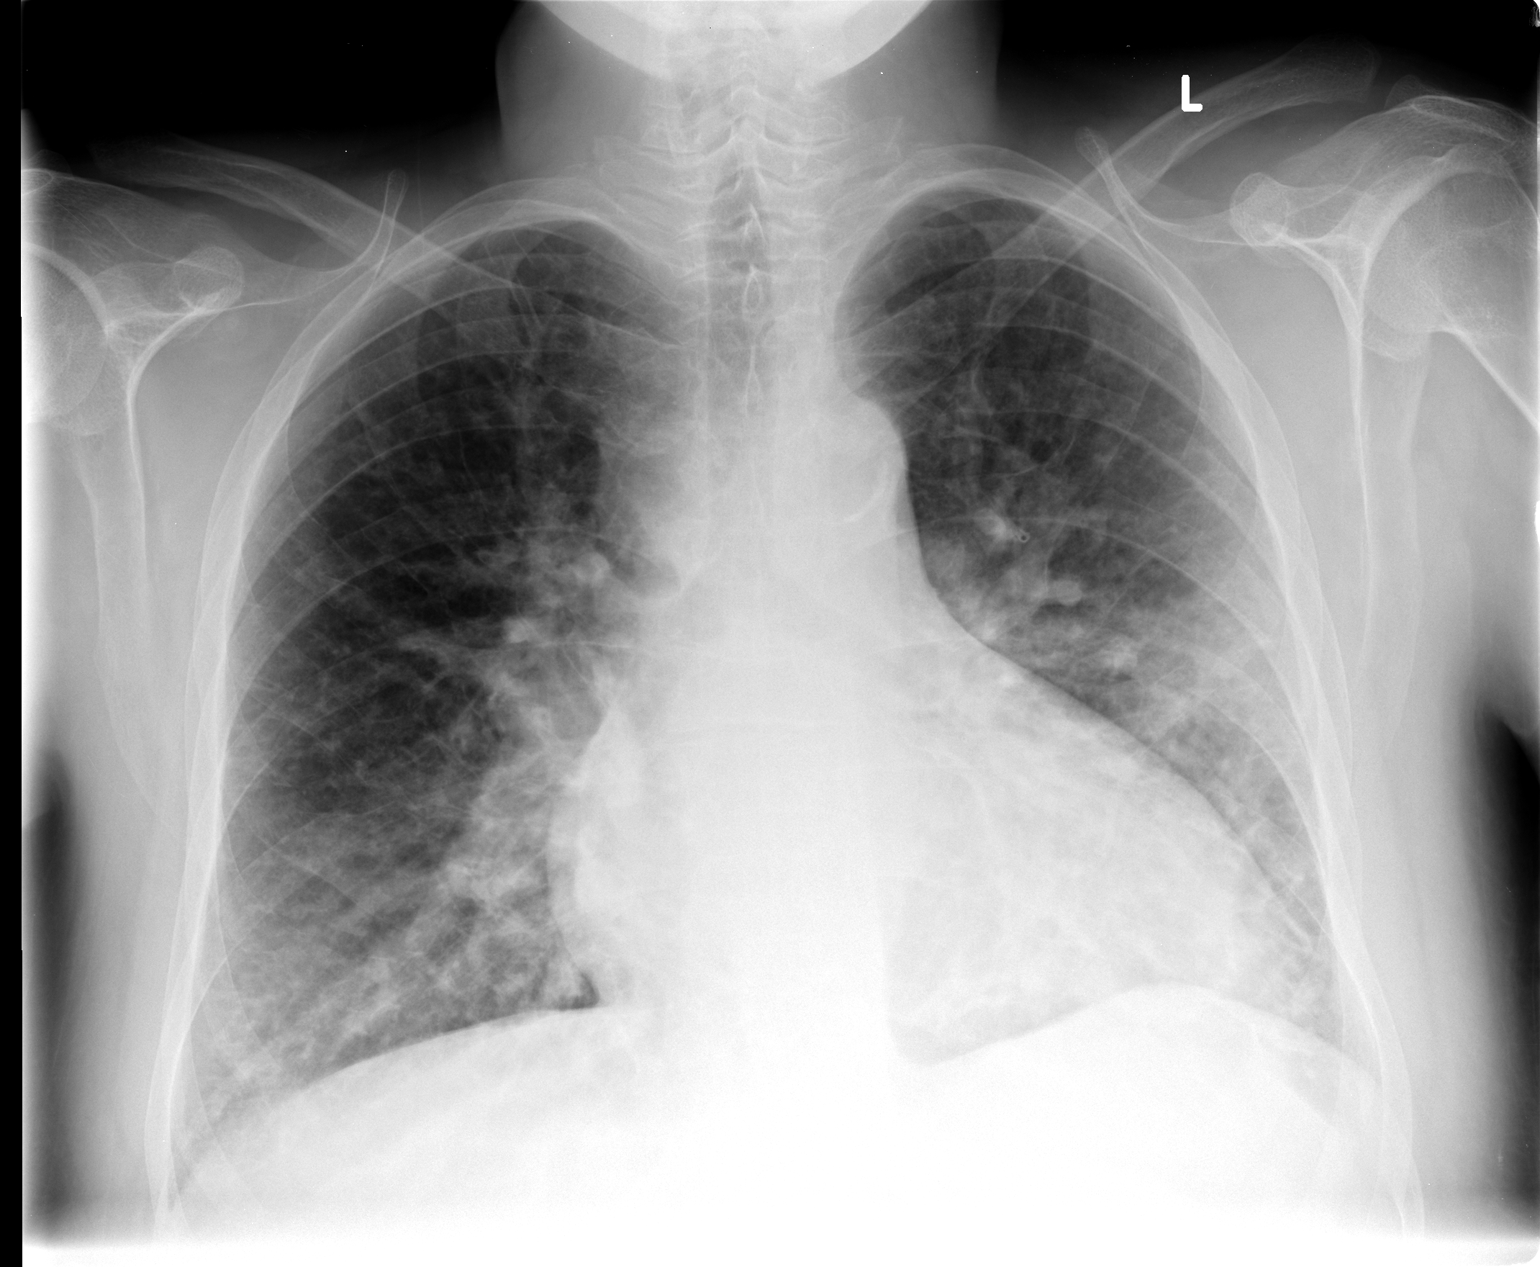

[view not recorded (2 of 2)]
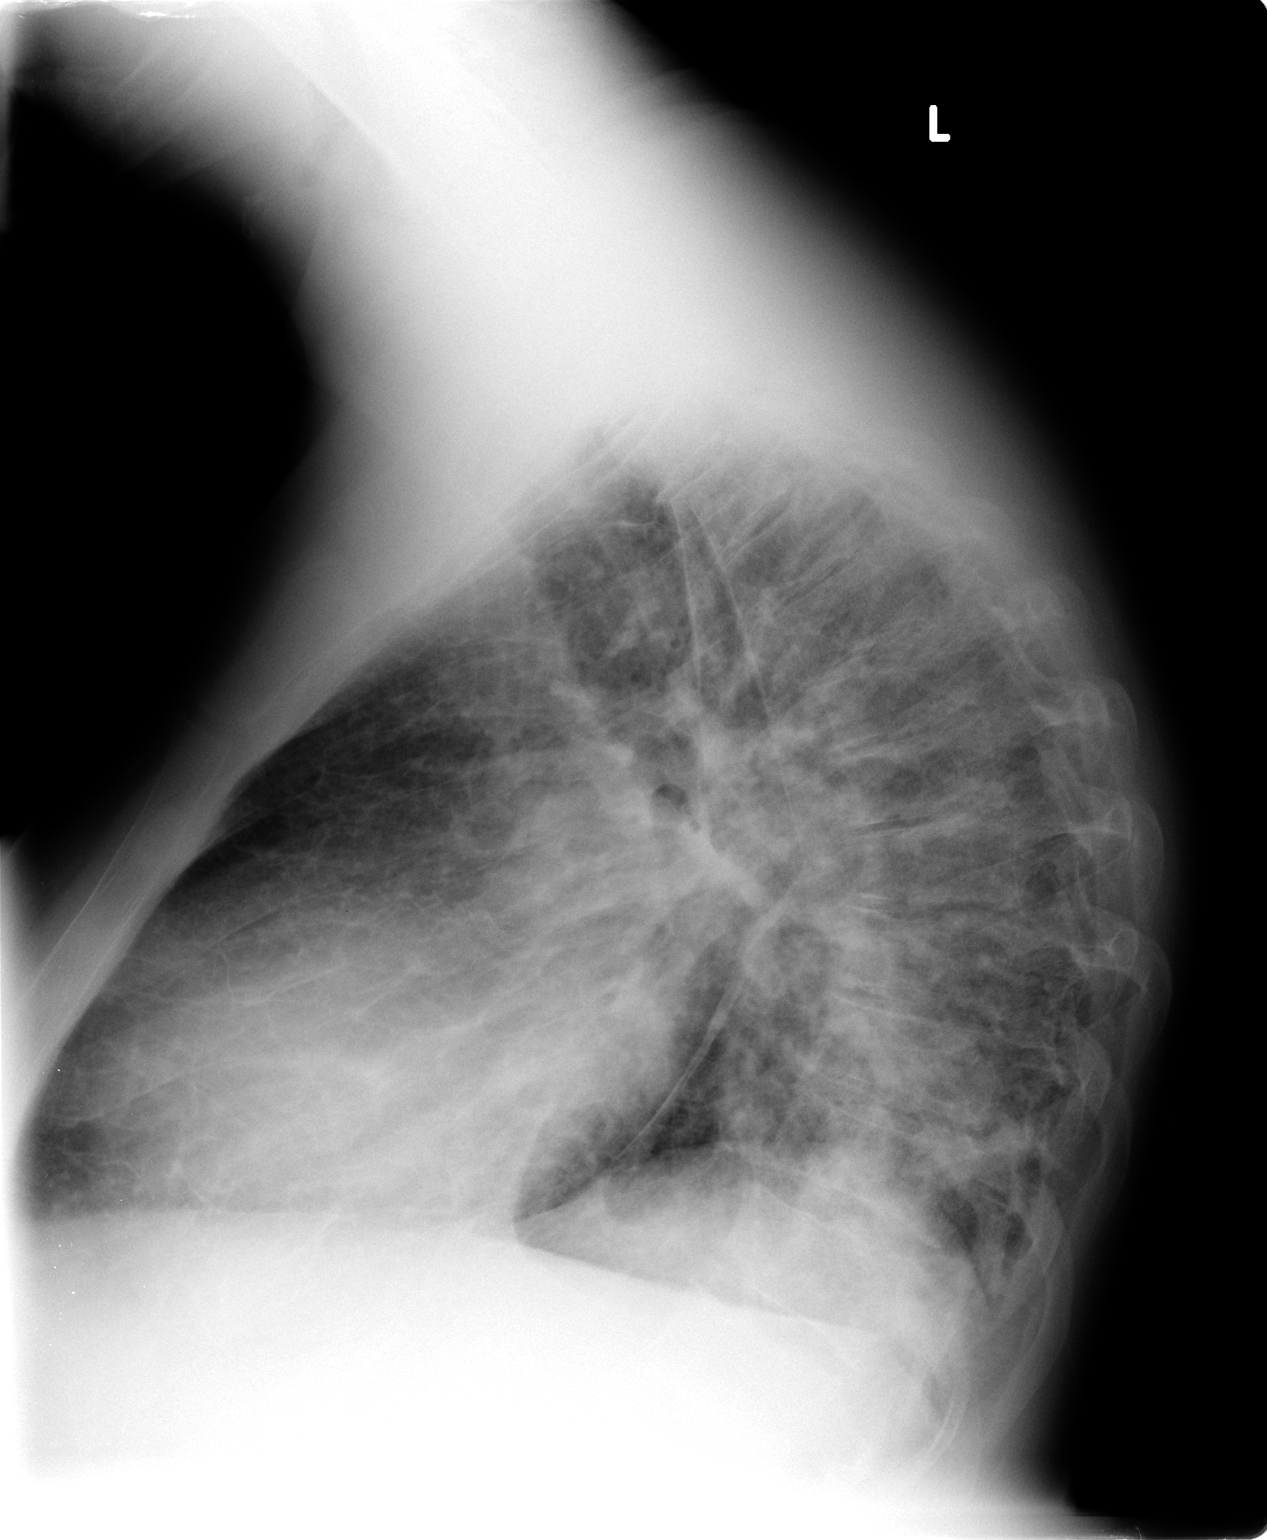

[2 of 2 positions shown; findings below may reference images not displayed]

FINDINGS: There is moderate cardiac enlargement.

No pleural effusion noted.

Mild to moderate interstitial edema.

Left midlung and left base airspace disease identified.

Review of the visualized osseous structures is unremarkable.
IMPRESSION: 1.  CHF.
2.  Left midlung and left base airspace disease which may represent
asymmetric edema or pneumonia.

## 2012-10-09 IMAGING — CR DG CHEST 1V PORT
1 series · 1 of 1 positions shown · non-contrast
Comparison: 03/21/2011

CLINICAL DATA: Shortness of breath.

PORTABLE CHEST - 1 VIEW

[view not recorded]
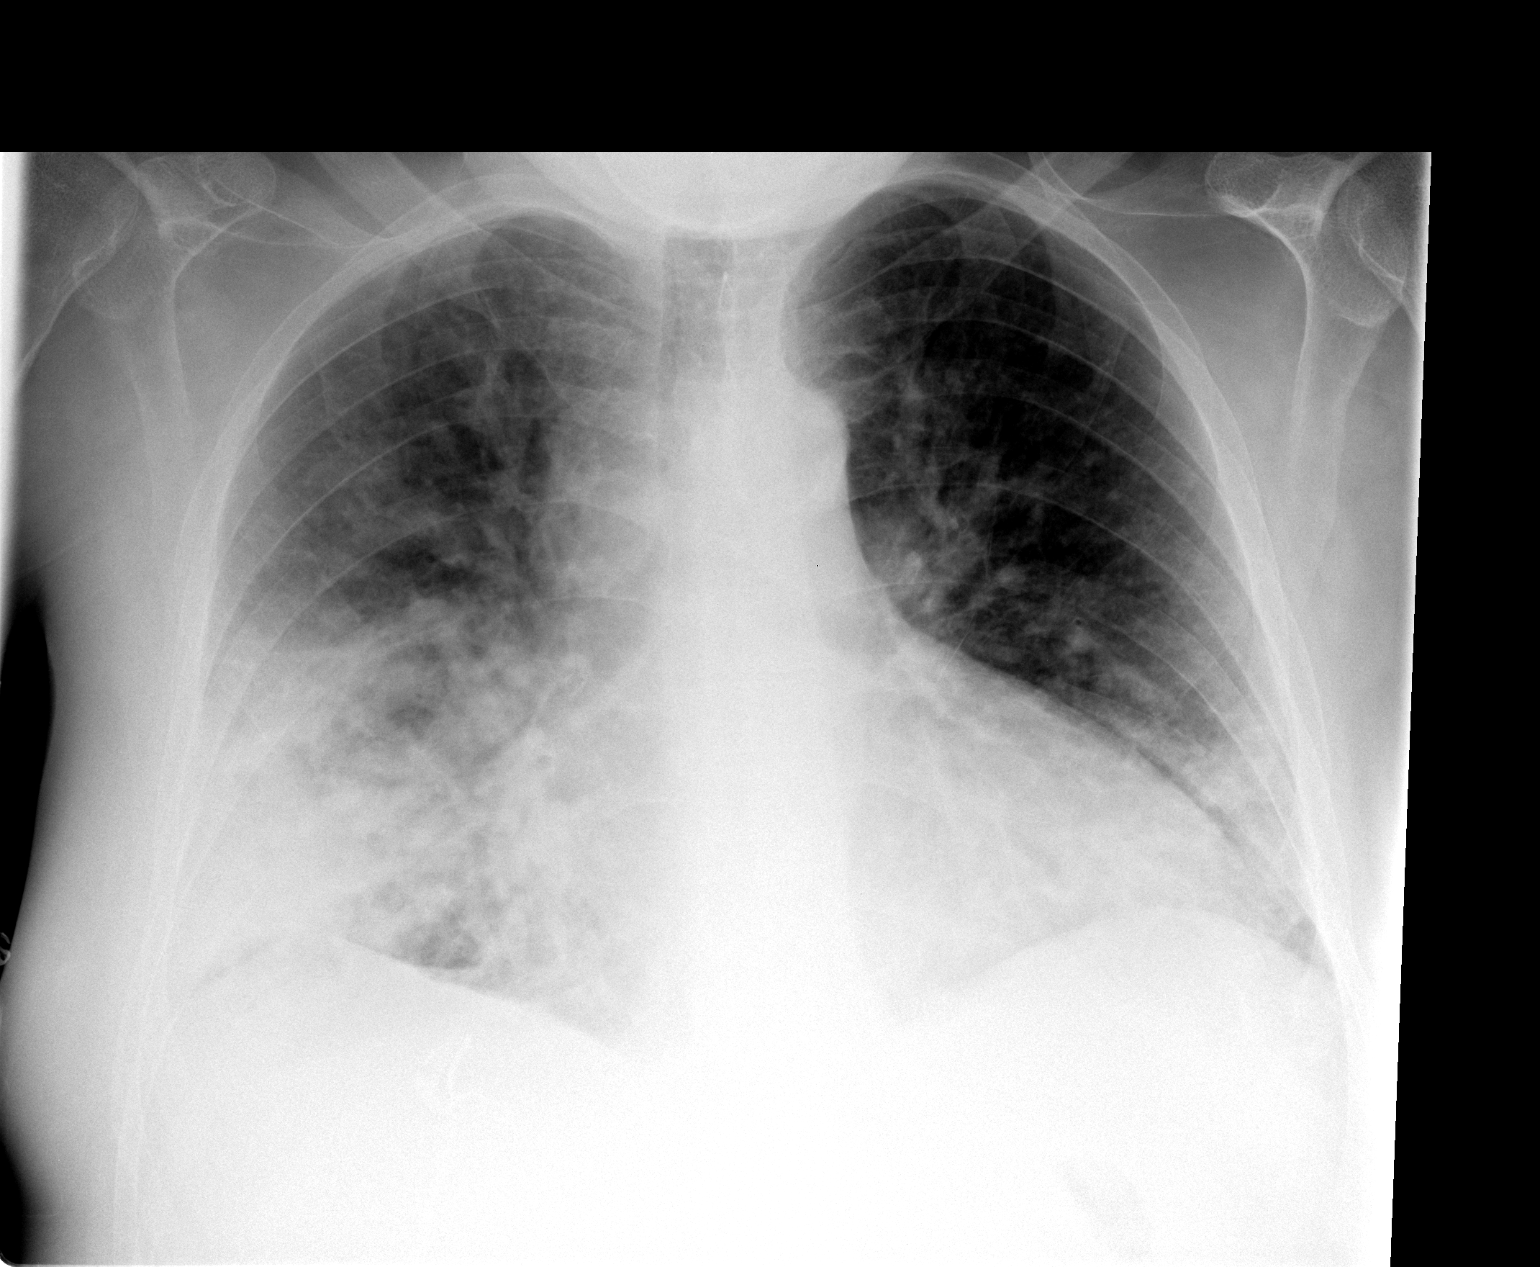

[1 of 1 positions shown; findings below may reference images not displayed]

FINDINGS: There are bilateral pulmonary infiltrates in the lower
lung zones as well as a small patchy infiltrate in the right upper
lobe.  There is cardiomegaly with mild pulmonary vascular
prominence.  No discrete effusions.
IMPRESSION: Bilateral pulmonary infiltrates, right greater than left.  I
suspect this represents pulmonary edema.

## 2012-11-05 IMAGING — CR DG CHEST 1V PORT
2 series · 2 of 2 positions shown · non-contrast
Comparison: 03/27/2011

CLINICAL DATA: Shortness of breath and chest pain.

PORTABLE CHEST - 1 VIEW

[view not recorded (1 of 2)]
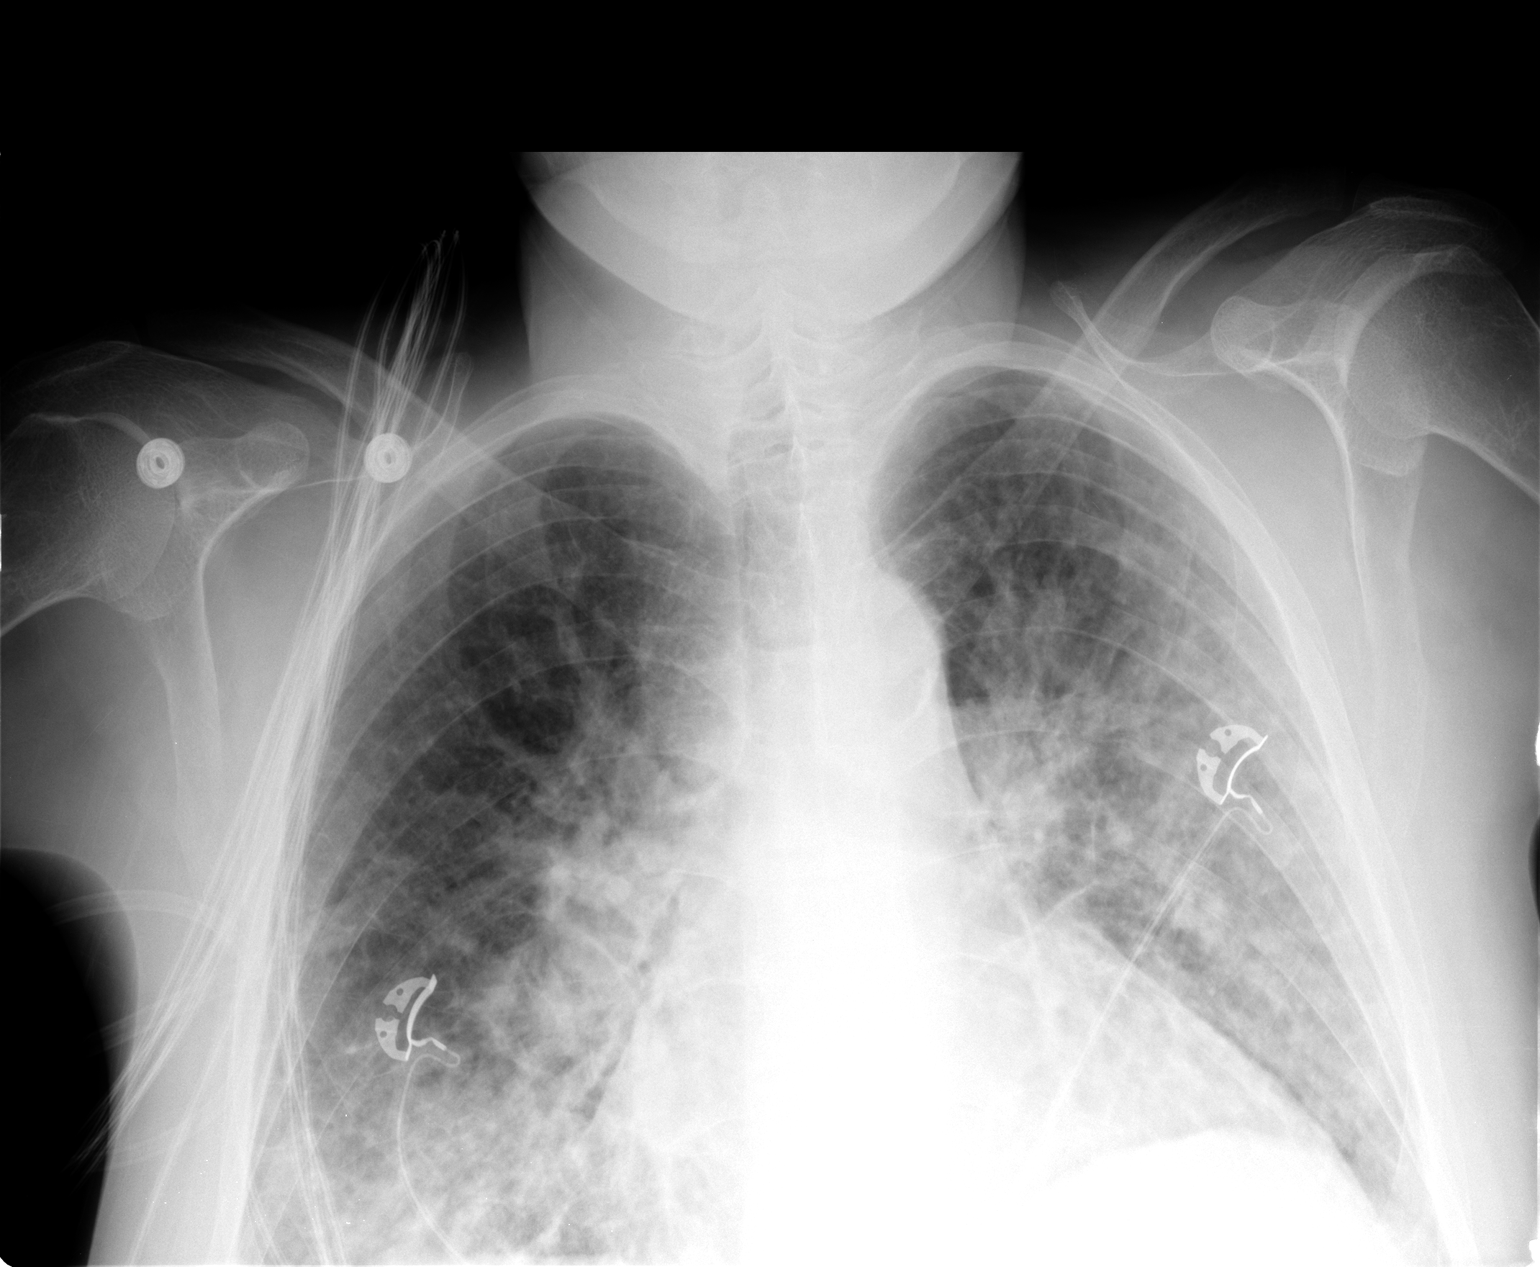

[view not recorded (2 of 2)]
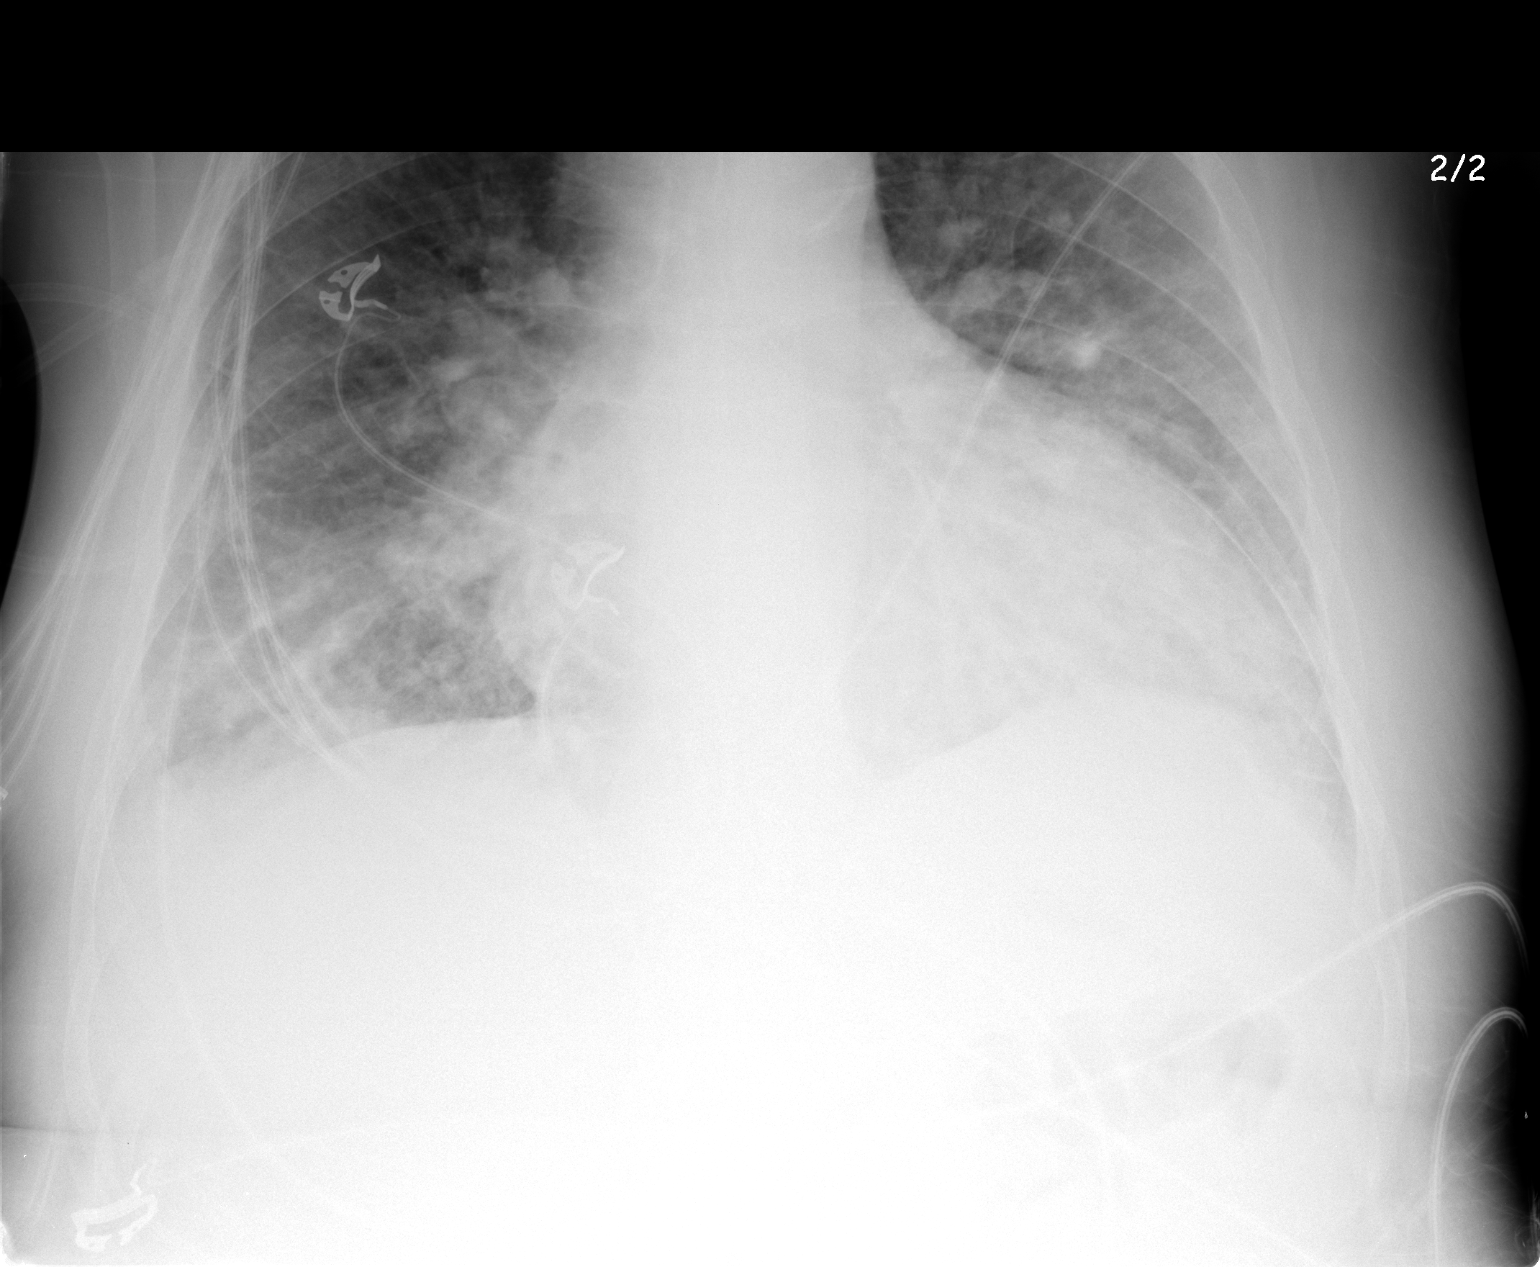

[2 of 2 positions shown; findings below may reference images not displayed]

FINDINGS: Portable upright view of the chest demonstrates enlarged
interstitial and vascular markings bilaterally.  There are slightly
increased densities in the right lower lung compared to the
remainder of the lungs.  The heart size appears enlarged.  Trachea
is midline.  The osseous structures are intact.
IMPRESSION: Bilateral enlarged interstitial markings most compatible with
pulmonary edema.  Slightly increased densities in the right lower
lung.

## 2012-11-09 IMAGING — CR DG CHEST 1V PORT
1 series · 1 of 1 positions shown · non-contrast
Comparison: Portable exam 9651 hours compared to 04/24/2011

CLINICAL DATA: Shortness of breath, lower extremity swelling,
nausea, vomiting, diarrhea, history asthma, COPD, CHF,
cardiomyopathy, end-stage renal disease, hypertension, hiatal
hernia, GERD

PORTABLE CHEST - 1 VIEW

[view not recorded]
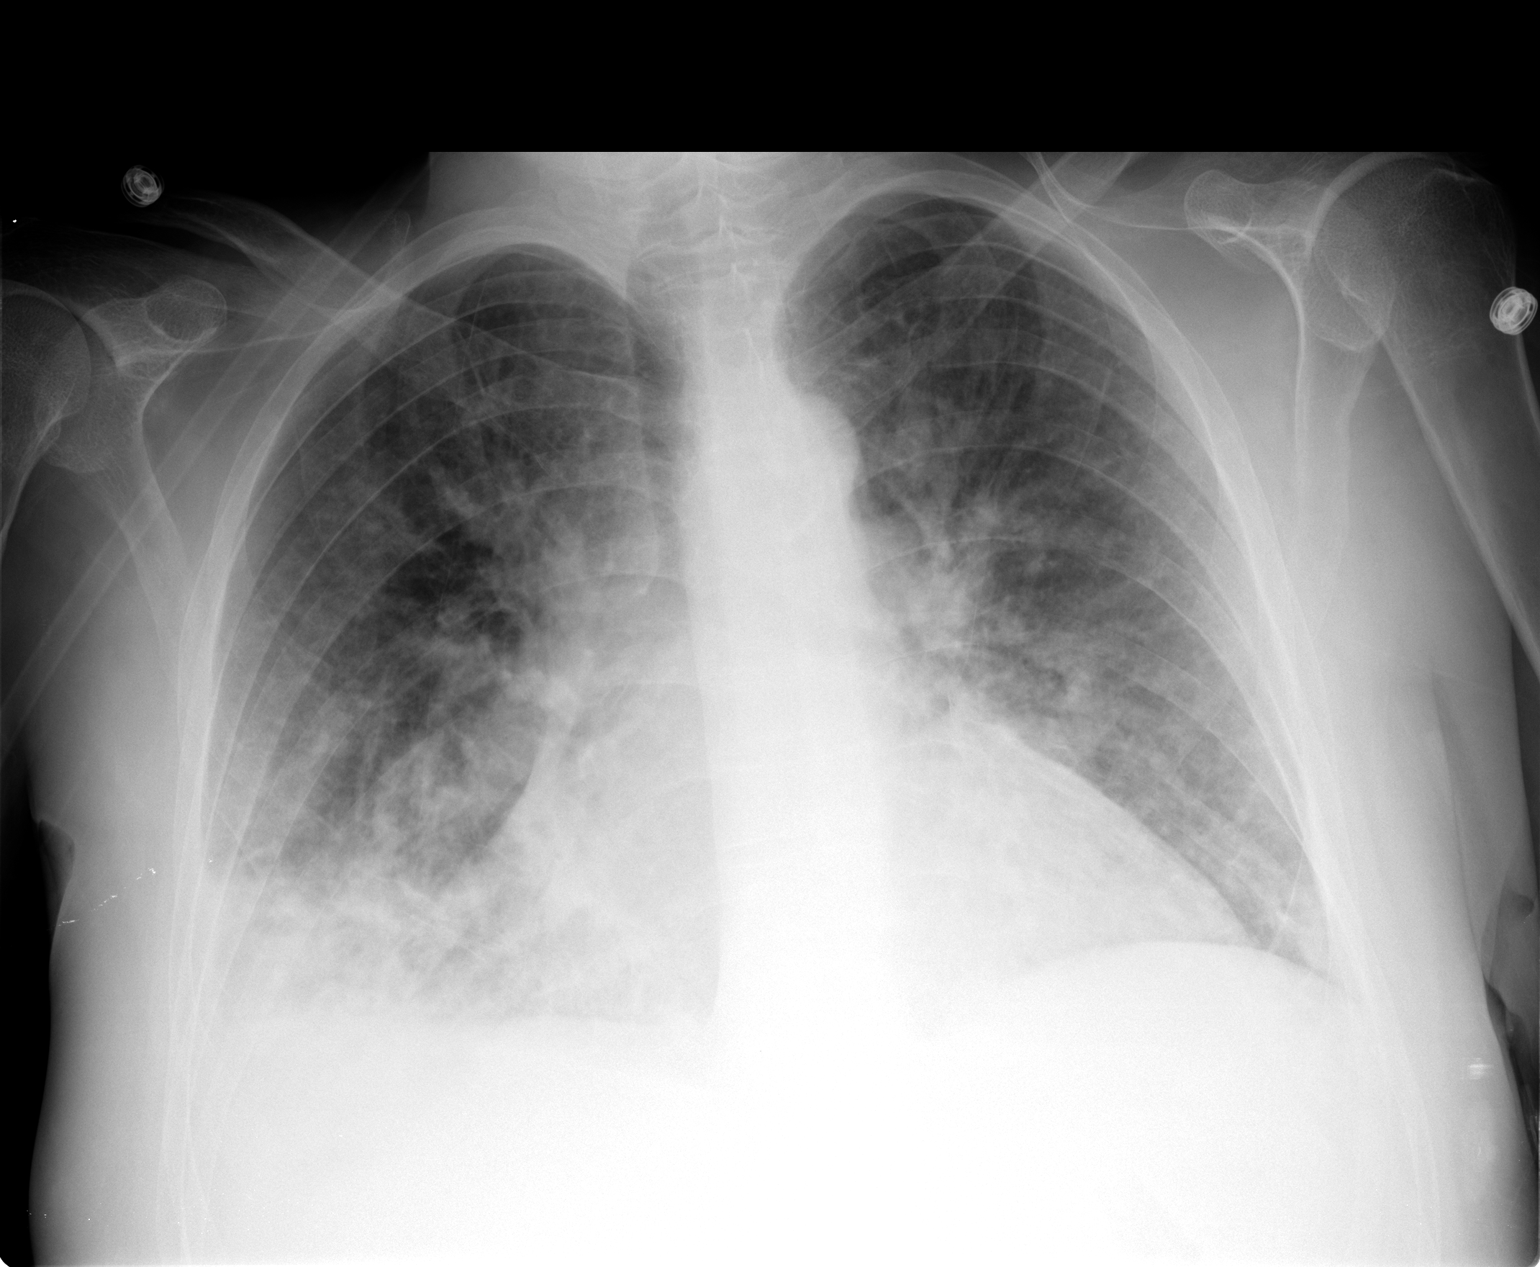

[1 of 1 positions shown; findings below may reference images not displayed]

FINDINGS: Enlargement of cardiac silhouette.
Pulmonary vascular congestion.
Increased interstitial markings in both lungs compatible with CHF,
question increased.
More focal opacity identified at right lung base, could represent
asymmetric edema or superimposed consolidation/infection.
Probable right basilar effusion.
No pneumothorax.
IMPRESSION: CHF, question minimally increased.
Cannot exclude coexistent consolidation at right lung base.
Small right pleural effusion.

## 2012-12-10 IMAGING — CR DG CHEST 1V PORT
1 series · 1 of 1 positions shown · non-contrast
Comparison: 04/27/2011.

CLINICAL DATA: Chest pain.

PORTABLE CHEST - 1 VIEW

[view not recorded]
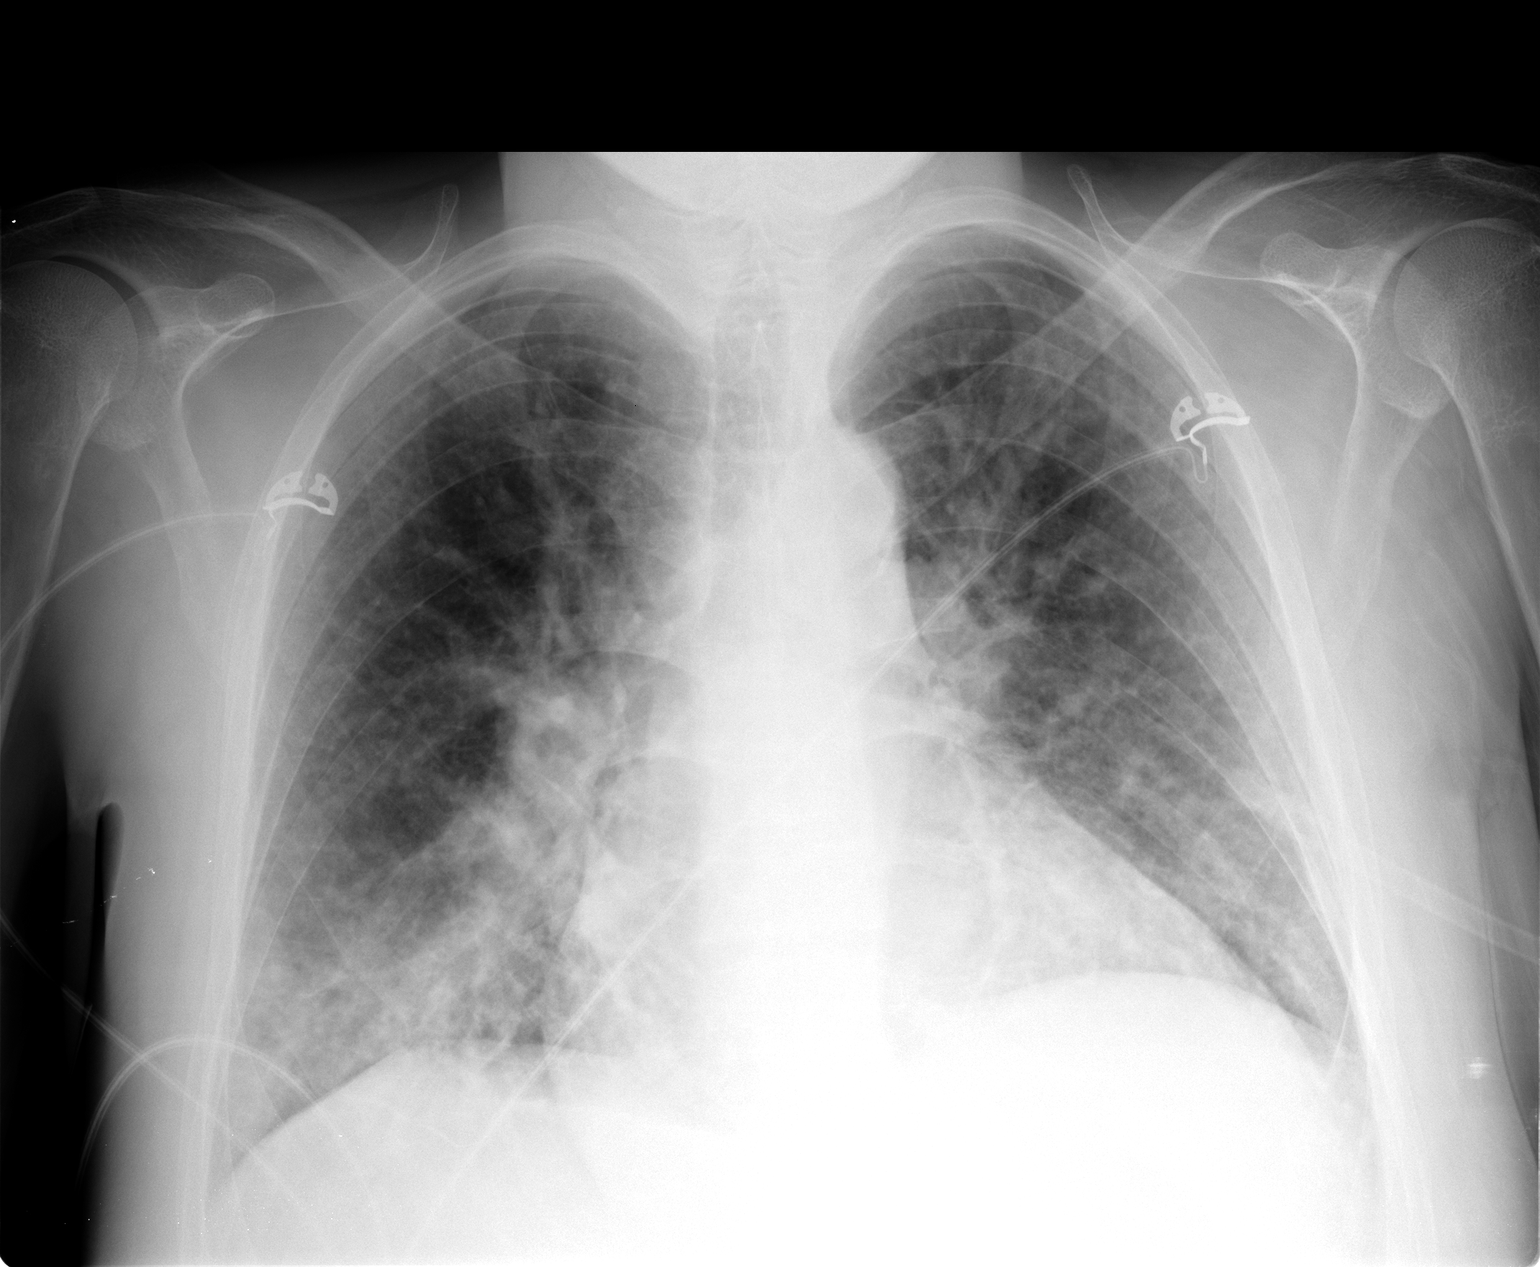

[1 of 1 positions shown; findings below may reference images not displayed]

FINDINGS: The heart is enlarged.  Mild pulmonary vascular
congestion is noted.  The right lower lobe airspace disease is
improved, but not entirely resolved.
IMPRESSION: 1.  Cardiomegaly and mild pulmonary vascular congestion.
2.  Improved airspace disease in the right lower lobe without
complete resolution.  This may represent scarring or some residual
pneumonia.

## 2012-12-12 ENCOUNTER — Encounter (HOSPITAL_COMMUNITY): Payer: Self-pay | Admitting: *Deleted

## 2012-12-12 ENCOUNTER — Emergency Department (HOSPITAL_COMMUNITY): Payer: Medicare Other

## 2012-12-12 ENCOUNTER — Emergency Department (HOSPITAL_COMMUNITY)
Admission: EM | Admit: 2012-12-12 | Discharge: 2012-12-12 | Disposition: A | Discharge status: Home / Self Care | Payer: Medicare Other | Attending: Emergency Medicine | Admitting: Emergency Medicine

## 2012-12-12 DIAGNOSIS — Z862 Personal history of diseases of the blood and blood-forming organs and certain disorders involving the immune mechanism: Secondary | ICD-10-CM | POA: Insufficient documentation

## 2012-12-12 DIAGNOSIS — G894 Chronic pain syndrome: Secondary | ICD-10-CM | POA: Insufficient documentation

## 2012-12-12 DIAGNOSIS — Z79899 Other long term (current) drug therapy: Secondary | ICD-10-CM | POA: Insufficient documentation

## 2012-12-12 DIAGNOSIS — J449 Chronic obstructive pulmonary disease, unspecified: Secondary | ICD-10-CM | POA: Insufficient documentation

## 2012-12-12 DIAGNOSIS — R110 Nausea: Secondary | ICD-10-CM | POA: Insufficient documentation

## 2012-12-12 DIAGNOSIS — Z992 Dependence on renal dialysis: Secondary | ICD-10-CM | POA: Insufficient documentation

## 2012-12-12 DIAGNOSIS — S8392XA Sprain of unspecified site of left knee, initial encounter: Secondary | ICD-10-CM

## 2012-12-12 DIAGNOSIS — F17200 Nicotine dependence, unspecified, uncomplicated: Secondary | ICD-10-CM | POA: Insufficient documentation

## 2012-12-12 DIAGNOSIS — G43909 Migraine, unspecified, not intractable, without status migrainosus: Secondary | ICD-10-CM | POA: Insufficient documentation

## 2012-12-12 DIAGNOSIS — Y9389 Activity, other specified: Secondary | ICD-10-CM | POA: Insufficient documentation

## 2012-12-12 DIAGNOSIS — Y929 Unspecified place or not applicable: Secondary | ICD-10-CM | POA: Insufficient documentation

## 2012-12-12 DIAGNOSIS — X500XXA Overexertion from strenuous movement or load, initial encounter: Secondary | ICD-10-CM | POA: Insufficient documentation

## 2012-12-12 DIAGNOSIS — Z7982 Long term (current) use of aspirin: Secondary | ICD-10-CM | POA: Insufficient documentation

## 2012-12-12 DIAGNOSIS — J4489 Other specified chronic obstructive pulmonary disease: Secondary | ICD-10-CM | POA: Insufficient documentation

## 2012-12-12 DIAGNOSIS — Z8679 Personal history of other diseases of the circulatory system: Secondary | ICD-10-CM | POA: Insufficient documentation

## 2012-12-12 DIAGNOSIS — F319 Bipolar disorder, unspecified: Secondary | ICD-10-CM | POA: Insufficient documentation

## 2012-12-12 DIAGNOSIS — N186 End stage renal disease: Secondary | ICD-10-CM | POA: Insufficient documentation

## 2012-12-12 DIAGNOSIS — I120 Hypertensive chronic kidney disease with stage 5 chronic kidney disease or end stage renal disease: Secondary | ICD-10-CM | POA: Insufficient documentation

## 2012-12-12 DIAGNOSIS — IMO0002 Reserved for concepts with insufficient information to code with codable children: Secondary | ICD-10-CM | POA: Insufficient documentation

## 2012-12-12 DIAGNOSIS — F209 Schizophrenia, unspecified: Secondary | ICD-10-CM | POA: Insufficient documentation

## 2012-12-12 DIAGNOSIS — H53149 Visual discomfort, unspecified: Secondary | ICD-10-CM | POA: Insufficient documentation

## 2012-12-12 MED ORDER — HYDROMORPHONE HCL PF 1 MG/ML IJ SOLN
1.0000 mg | Freq: Once | INTRAMUSCULAR | Status: AC
  Administered 2012-12-12: 1 mg via INTRAMUSCULAR
  Filled 2012-12-12: qty 1

## 2012-12-12 NOTE — ED Notes (Signed)
Pt has own crutches

## 2012-12-12 NOTE — ED Notes (Signed)
Pt c/o migraine since yesterday morning, c/o left knee after twisting it couple of days ago per pt-numbness and burning pain to left knee, denies vomiting, + nausea

## 2012-12-12 NOTE — ED Notes (Signed)
Pt requesting more pain medication.

## 2012-12-13 ENCOUNTER — Emergency Department (HOSPITAL_COMMUNITY)
Admission: EM | Admit: 2012-12-13 | Discharge: 2012-12-13 | Disposition: A | Discharge status: Home / Self Care | Payer: Medicare Other | Attending: Emergency Medicine | Admitting: Emergency Medicine

## 2012-12-13 ENCOUNTER — Encounter (HOSPITAL_COMMUNITY): Payer: Self-pay

## 2012-12-13 DIAGNOSIS — Y939 Activity, unspecified: Secondary | ICD-10-CM | POA: Insufficient documentation

## 2012-12-13 DIAGNOSIS — F17200 Nicotine dependence, unspecified, uncomplicated: Secondary | ICD-10-CM | POA: Insufficient documentation

## 2012-12-13 DIAGNOSIS — Y929 Unspecified place or not applicable: Secondary | ICD-10-CM | POA: Insufficient documentation

## 2012-12-13 DIAGNOSIS — Z862 Personal history of diseases of the blood and blood-forming organs and certain disorders involving the immune mechanism: Secondary | ICD-10-CM | POA: Insufficient documentation

## 2012-12-13 DIAGNOSIS — Z7982 Long term (current) use of aspirin: Secondary | ICD-10-CM | POA: Insufficient documentation

## 2012-12-13 DIAGNOSIS — Z79899 Other long term (current) drug therapy: Secondary | ICD-10-CM | POA: Insufficient documentation

## 2012-12-13 DIAGNOSIS — S8990XA Unspecified injury of unspecified lower leg, initial encounter: Secondary | ICD-10-CM | POA: Insufficient documentation

## 2012-12-13 DIAGNOSIS — N186 End stage renal disease: Secondary | ICD-10-CM | POA: Insufficient documentation

## 2012-12-13 DIAGNOSIS — J449 Chronic obstructive pulmonary disease, unspecified: Secondary | ICD-10-CM | POA: Insufficient documentation

## 2012-12-13 DIAGNOSIS — M25562 Pain in left knee: Secondary | ICD-10-CM

## 2012-12-13 DIAGNOSIS — I10 Essential (primary) hypertension: Secondary | ICD-10-CM | POA: Insufficient documentation

## 2012-12-13 DIAGNOSIS — Z8659 Personal history of other mental and behavioral disorders: Secondary | ICD-10-CM | POA: Insufficient documentation

## 2012-12-13 DIAGNOSIS — Z992 Dependence on renal dialysis: Secondary | ICD-10-CM | POA: Insufficient documentation

## 2012-12-13 DIAGNOSIS — X500XXA Overexertion from strenuous movement or load, initial encounter: Secondary | ICD-10-CM | POA: Insufficient documentation

## 2012-12-13 DIAGNOSIS — M25569 Pain in unspecified knee: Secondary | ICD-10-CM | POA: Insufficient documentation

## 2012-12-13 DIAGNOSIS — G8929 Other chronic pain: Secondary | ICD-10-CM | POA: Insufficient documentation

## 2012-12-13 DIAGNOSIS — J4489 Other specified chronic obstructive pulmonary disease: Secondary | ICD-10-CM | POA: Insufficient documentation

## 2012-12-13 DIAGNOSIS — Z8679 Personal history of other diseases of the circulatory system: Secondary | ICD-10-CM | POA: Insufficient documentation

## 2012-12-13 MED ORDER — HYDROMORPHONE HCL PF 1 MG/ML IJ SOLN
2.0000 mg | Freq: Once | INTRAMUSCULAR | Status: AC
  Administered 2012-12-13: 2 mg via INTRAMUSCULAR
  Filled 2012-12-13: qty 2
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
## 2012-12-13 NOTE — ED Notes (Signed)
Left knee pain after twisting left knee few days ago per pt

## 2012-12-14 NOTE — ED Provider Notes (Signed)
Medical screening examination/treatment/procedure(s) were performed by non-physician practitioner and as supervising physician I was immediately available for consultation/collaboration.  Donnetta Hutching, MD 12/14/12 [phone]
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
## 2012-12-15 IMAGING — CR DG CHEST 1V PORT
1 series · 1 of 1 positions shown · non-contrast
Comparison: 05/28/2011.

CLINICAL DATA: Short of breath.

PORTABLE CHEST - 1 VIEW

[view not recorded]
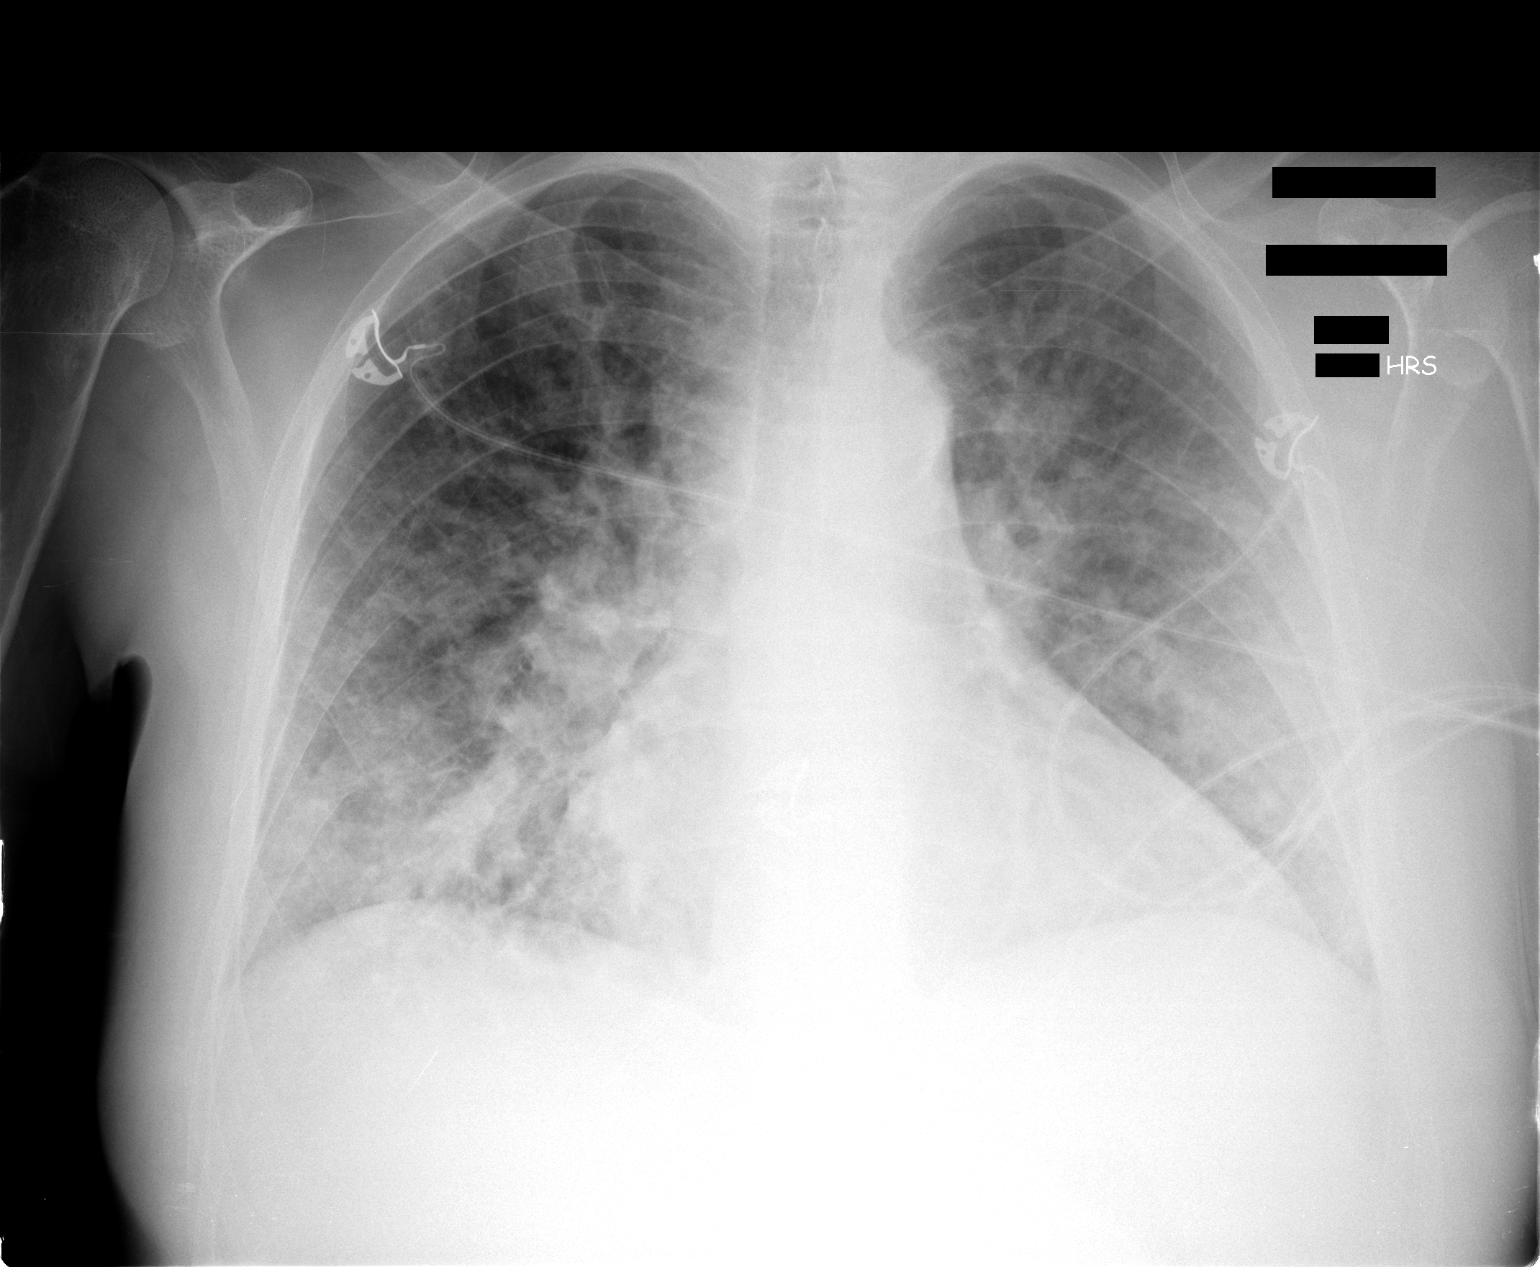

[1 of 1 positions shown; findings below may reference images not displayed]

FINDINGS: Cardiomegaly. Monitoring leads are projected over the
chest.  Diffuse bilateral airspace disease has worsened compared to
the prior exam, now consistent with moderate congestive heart
failure.  Trachea midline.  No effusion.
IMPRESSION: Worsening aeration, now compatible with moderate CHF.

## 2012-12-21 NOTE — ED Provider Notes (Signed)
Medical screening examination/treatment/procedure(s) were performed by non-physician practitioner and as supervising physician I was immediately available for consultation/collaboration.   [name], MD 12/21/12 [phone]

## 2012-12-24 IMAGING — CT CT ABD-PELV W/O CM
2 of 3 series · 16 of 46 positions shown, 18 images · non-contrast
Comparison: 02/12/2011

CLINICAL DATA: Right-sided flank pain

CT ABDOMEN AND PELVIS WITHOUT CONTRAST
TECHNIQUE: Multidetector CT imaging of the abdomen and pelvis was
performed following the standard protocol without intravenous
contrast.

[Series 2: standard/full over (age)lbs 5.0 · axial · 0.72mm/px · z∈[+430,+830]mm · 13 of 92 slices shown, 15 images]
[im 6/92  soft-tissue]
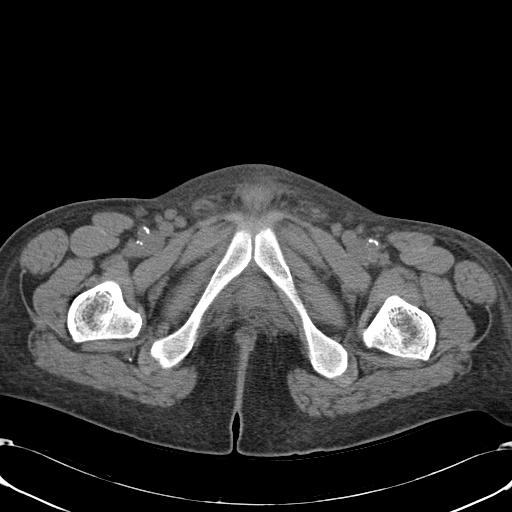
[im 6/92  bone]
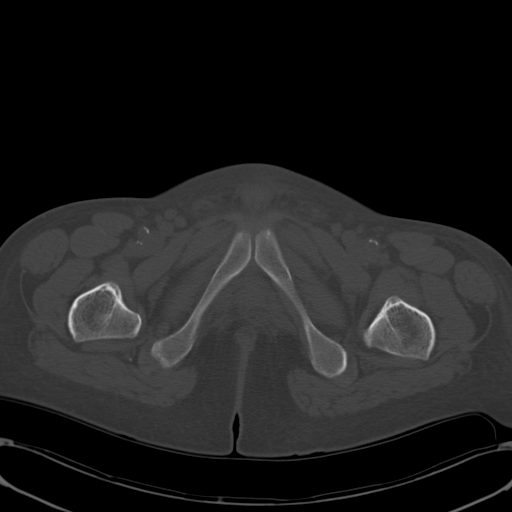
[im 12/92  soft-tissue]
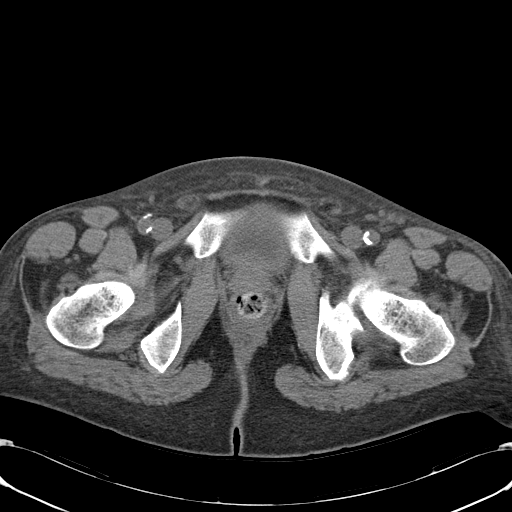
[im 18/92  soft-tissue]
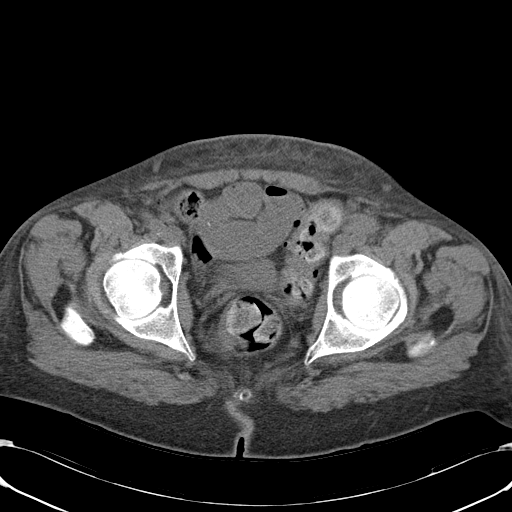
[im 27/92  soft-tissue]
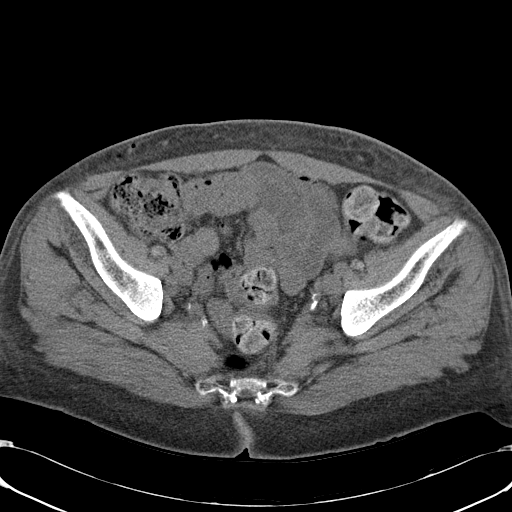
[im 33/92  soft-tissue]
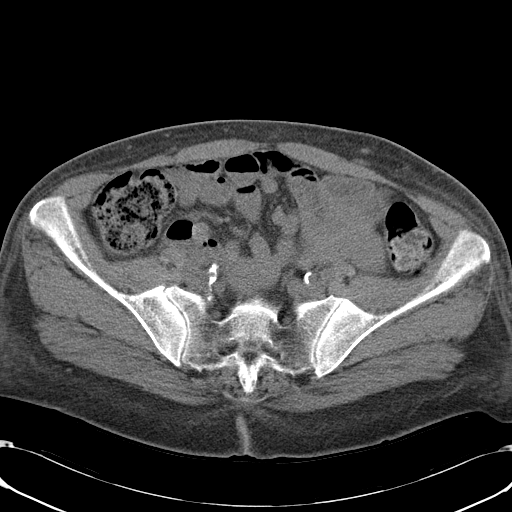
[im 39/92  soft-tissue]
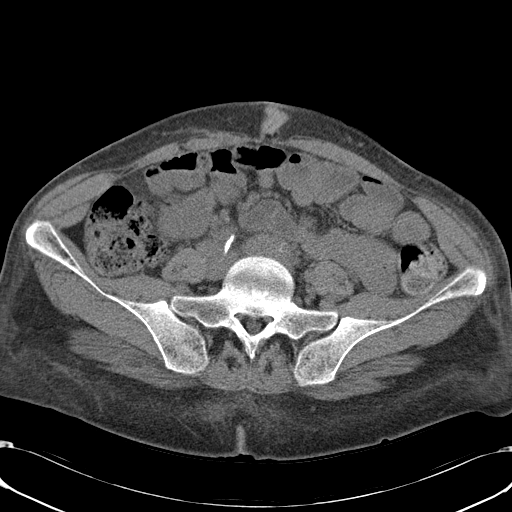
[im 47/92  soft-tissue]
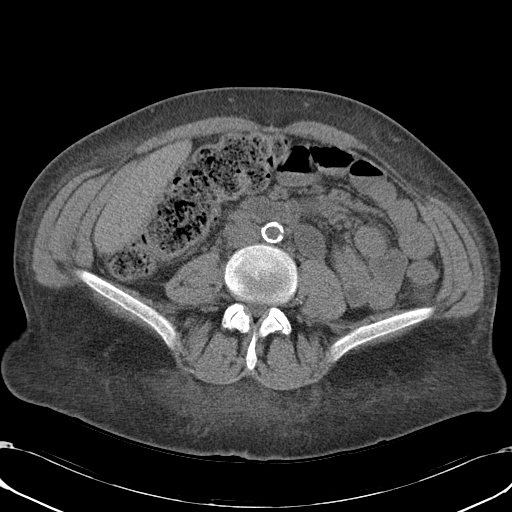
[im 53/92  soft-tissue]
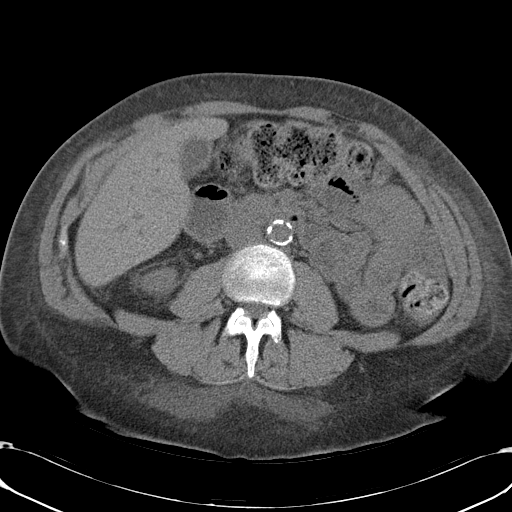
[im 59/92  soft-tissue]
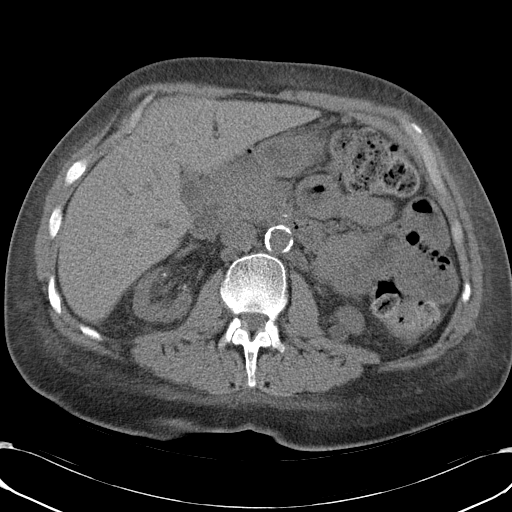
[im 59/92  bone]
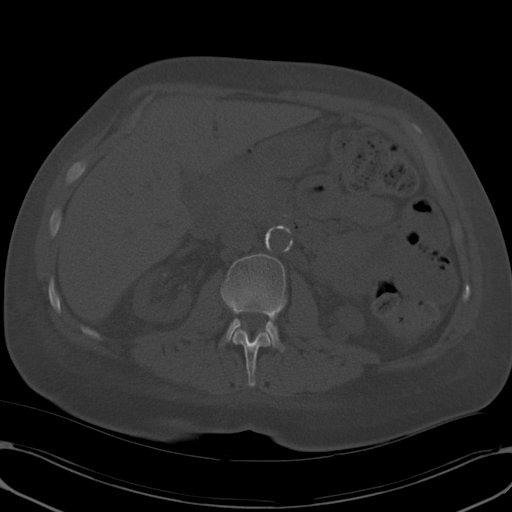
[im 65/92  soft-tissue]
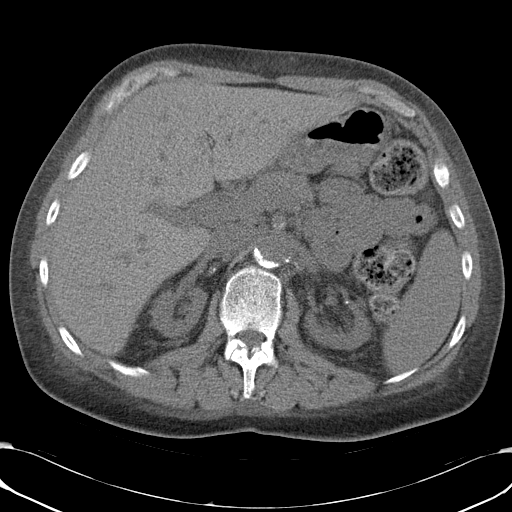
[im 74/92  soft-tissue]
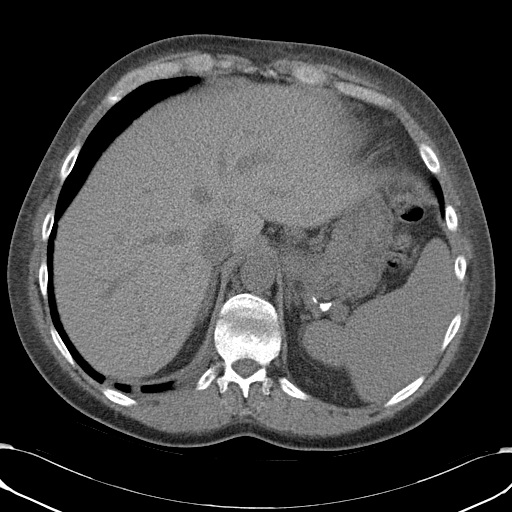
[im 80/92  soft-tissue]
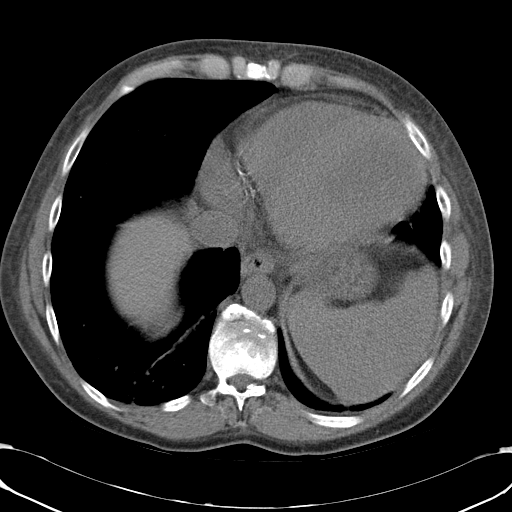
[im 86/92  soft-tissue]
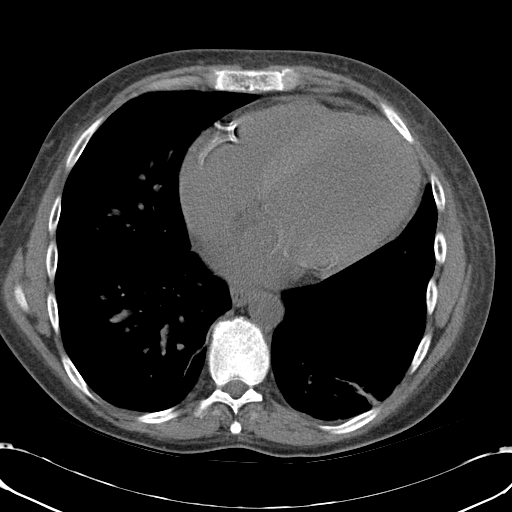

[Series 4: mpr coronal · coronal · 0.90mm/px · 3 of 85 slices shown]
[im 29/85  soft-tissue]
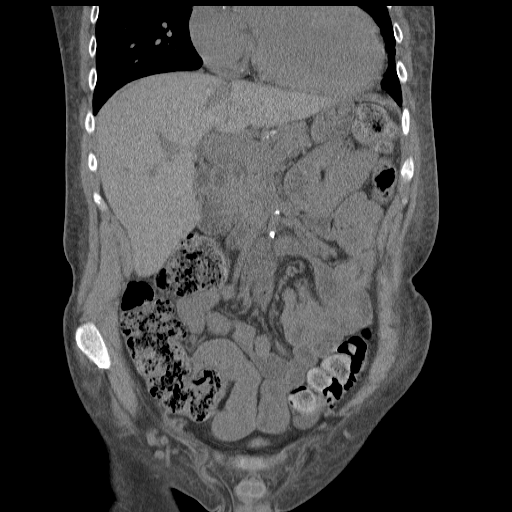
[im 38/85  soft-tissue]
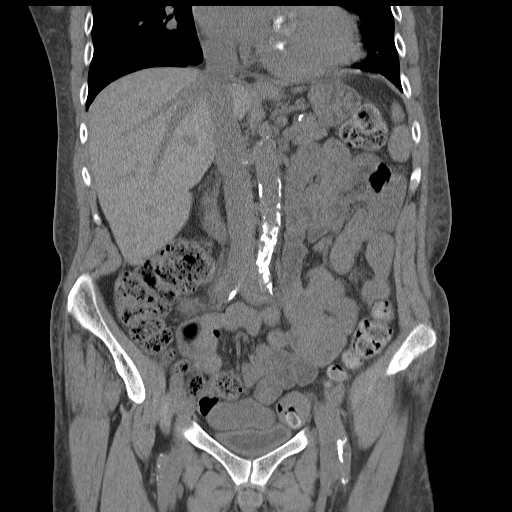
[im 47/85  soft-tissue]
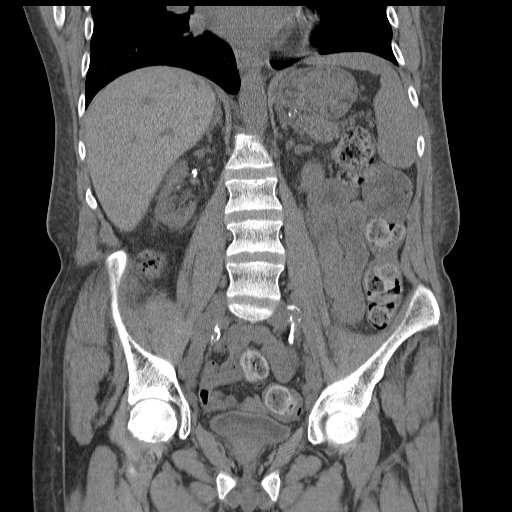

[16 of 46 positions shown; findings below may reference images not displayed]

FINDINGS: Curvilinear bilateral lower lobe scarring or atelectasis
noted.

Heart size is mildly enlarged with trace pericardial fluid.
Splenic arterial calcifications are noted.  Bilateral renal
cortical atrophy noted.  Trace perinephric stranding is
nonspecific.  No hydronephrosis.  Extensive atherosclerotic
vascular calcifications are noted.  No aortic aneurysm.  No
radiopaque renal, ureteral, or bladder calculus.

There is diffuse soft tissue anasarca again noted. Unenhanced CT
was performed per clinician order.  Lack of IV contrast limits
sensitivity and specificity, especially for evaluation of
abdominal/pelvic solid viscera.  Unenhanced liver, gallbladder,
adrenal glands, spleen, and pancreas are unremarkable.  1.4 cm low
lower renal pole cortical cyst is stable.  0.7 cm exophytic left
mid renal cortical lesion is decreased in size but otherwise too
small to characterize.

Numerous mildly prominent abdominal lymph nodes are identified,
representative gastrohepatic conglomerate lymph nodes measuring
cm image 21 and representative periaortic lymph node image 32
measuring 1.0 cm.  This is not significantly changed.  No new
ascites or loculated fluid collection.  No free air.

Moderate stool volume noted throughout nondilated colon.  Appendix
not visualized but there is no secondary evidence to suggest acute
appendicitis.  Small bowel is unremarkable.  Small umbilical hernia
is stable in appearance.  No acute osseous finding. Multilevel disc
degenerative change is again noted.
IMPRESSION: No acute intra-abdominal or pelvic pathology.

Stable anasarca and mild cardiomegaly.  This suggests fluid
overload or hypoproteinemia.

Renal atrophy with left renal cortical lesions as above, likely not
requiring specific further follow-up.

No radiopaque renal or ureteral calculus is specifically identified
to explain the history of right-sided flank pain.

## 2012-12-27 IMAGING — CR DG CHEST 1V PORT
1 series · 1 of 1 positions shown · non-contrast
Comparison: 06/02/2011

CLINICAL DATA: Chest pain and shortness of breath.

PORTABLE CHEST - 1 VIEW

[view not recorded]
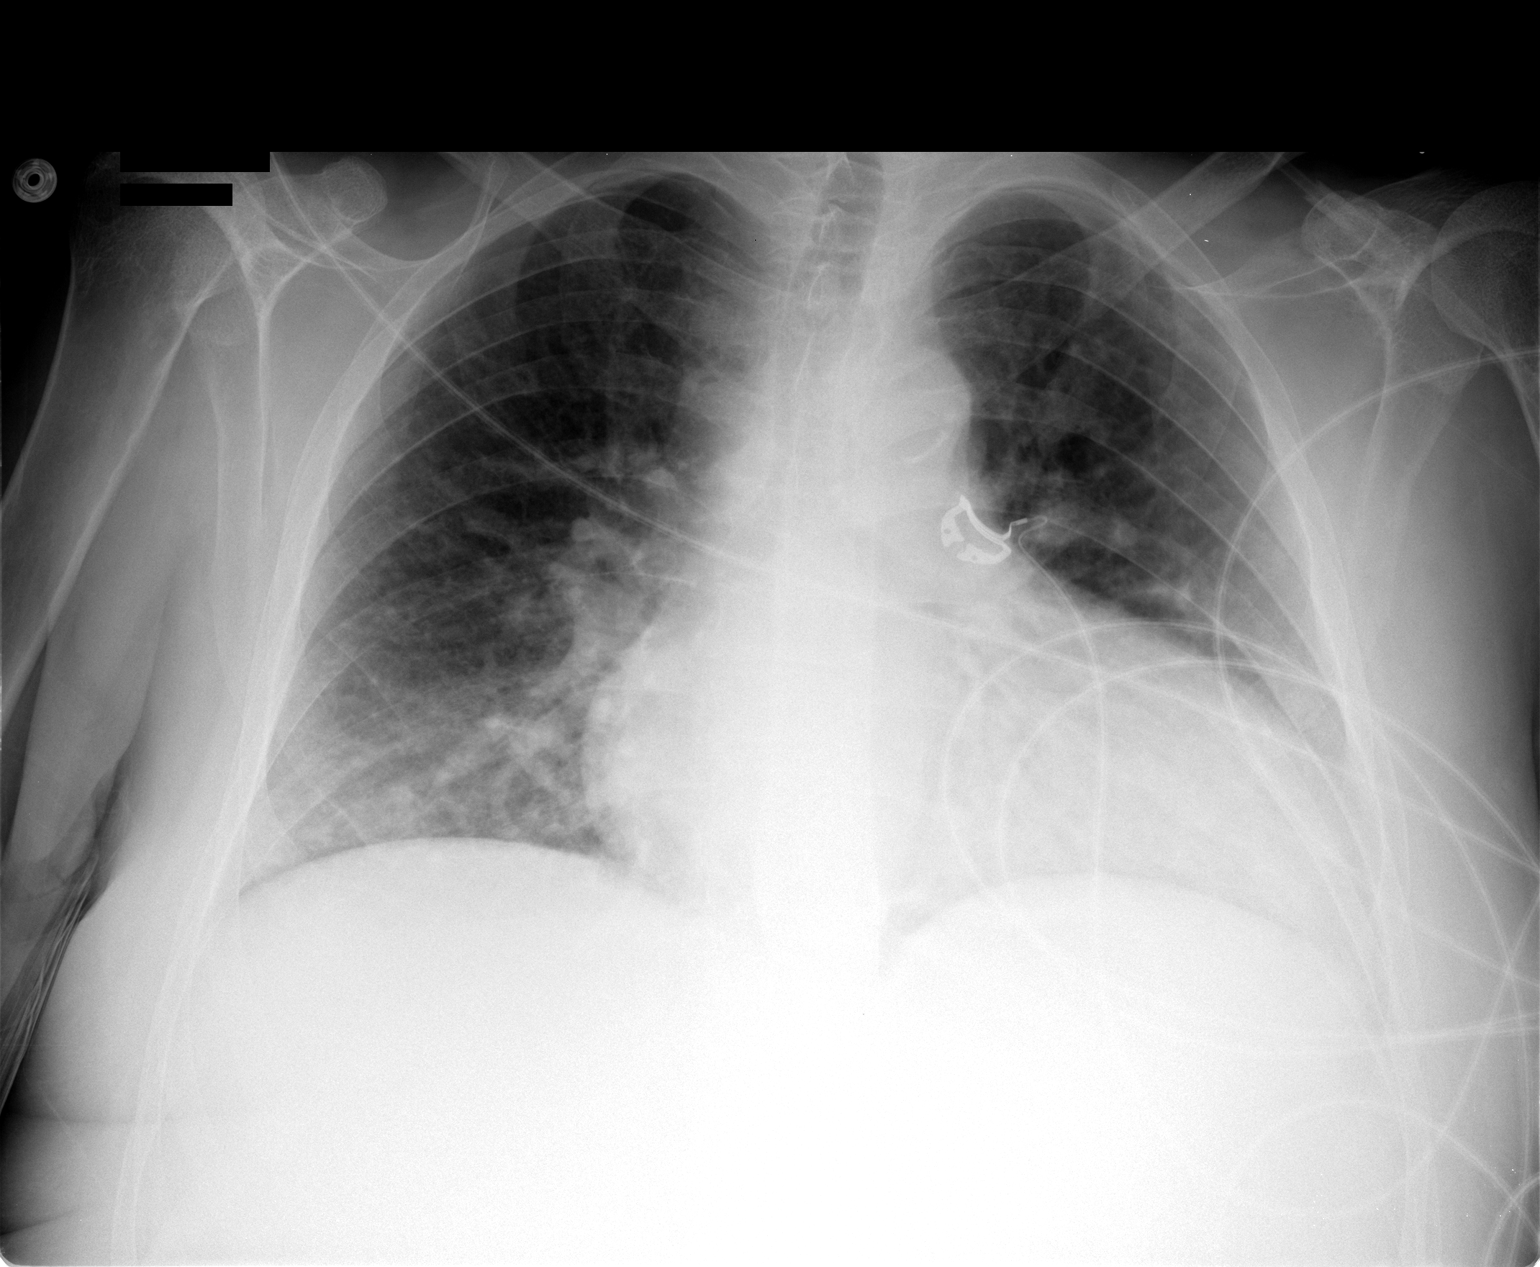

[1 of 1 positions shown; findings below may reference images not displayed]

FINDINGS: There is chronic cardiomegaly.  There is mild pulmonary
vascular congestion with slight haziness at the bases which
probably represents mild edema.  This is not as prominent as on the
prior exam.
IMPRESSION: Mild congestive heart failure.

## 2012-12-29 IMAGING — CR DG CHEST 2V
2 series · 2 of 2 positions shown · non-contrast
Comparison: 06/14/2011

CLINICAL DATA: Chest pain

CHEST - 2 VIEW

[view not recorded (1 of 2)]
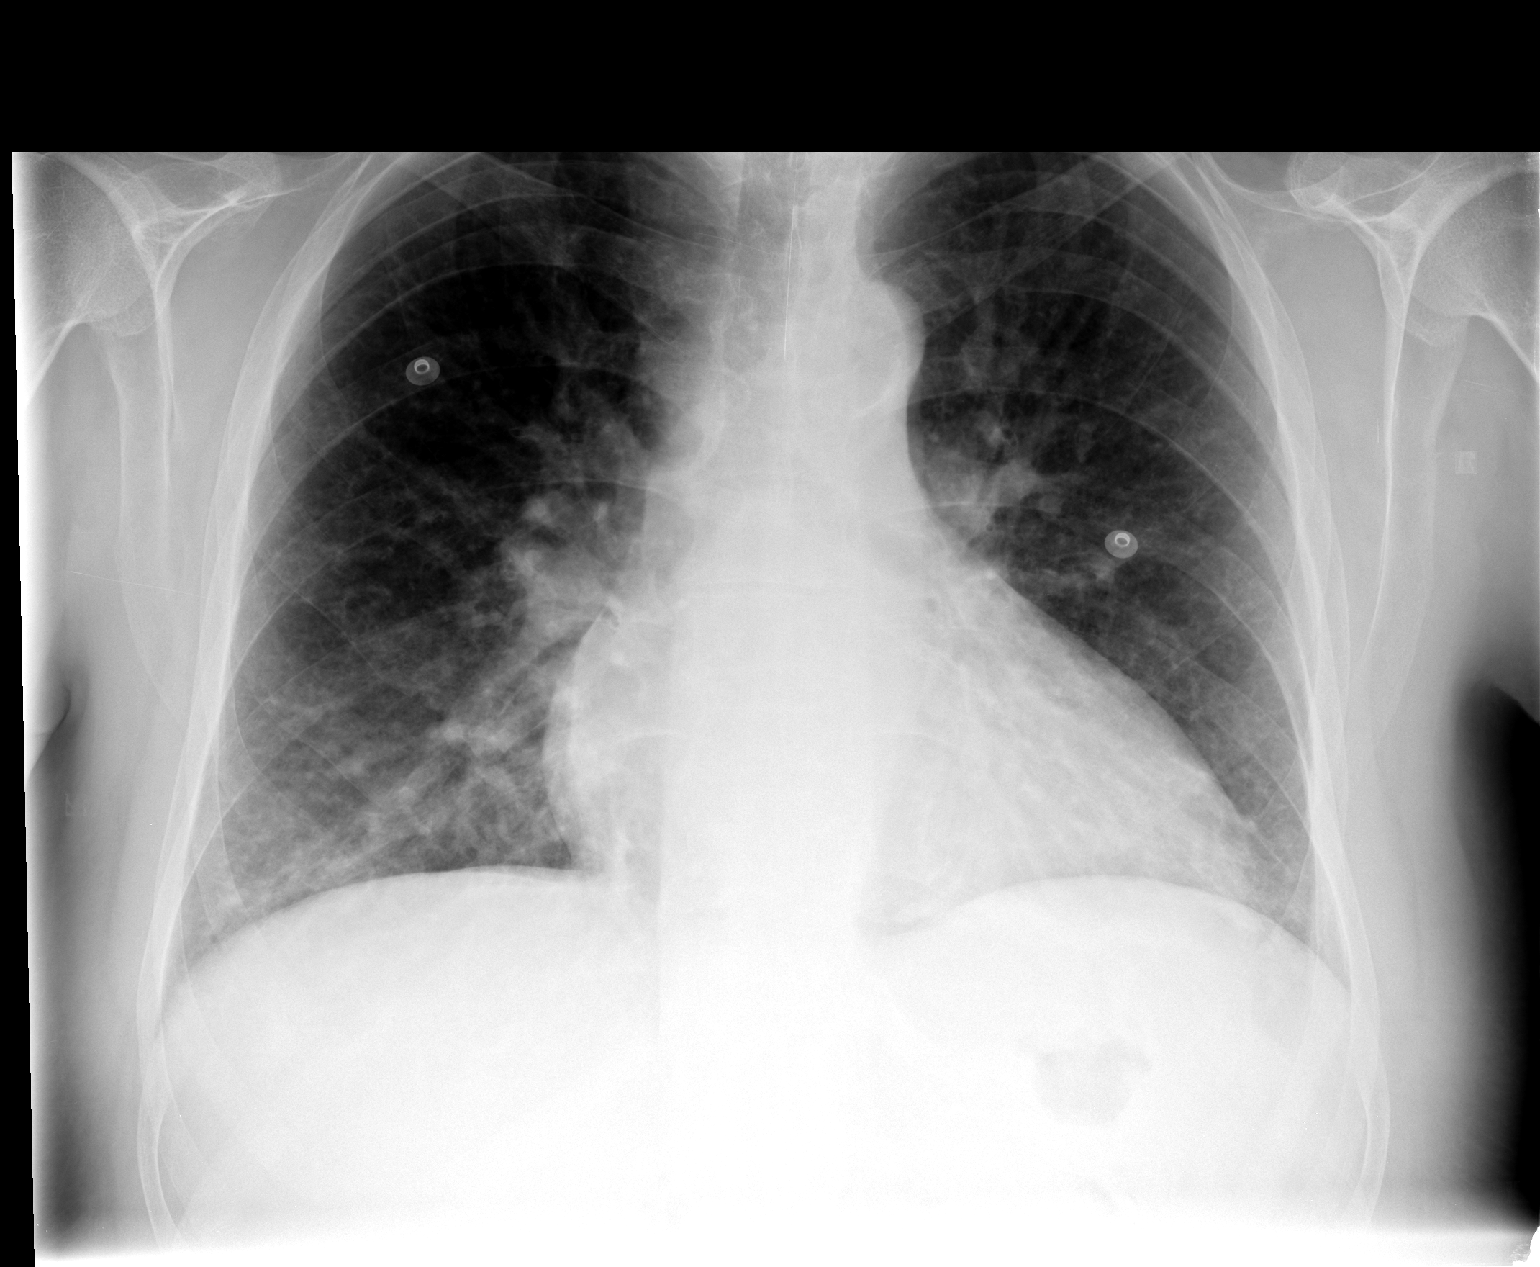

[view not recorded (2 of 2)]
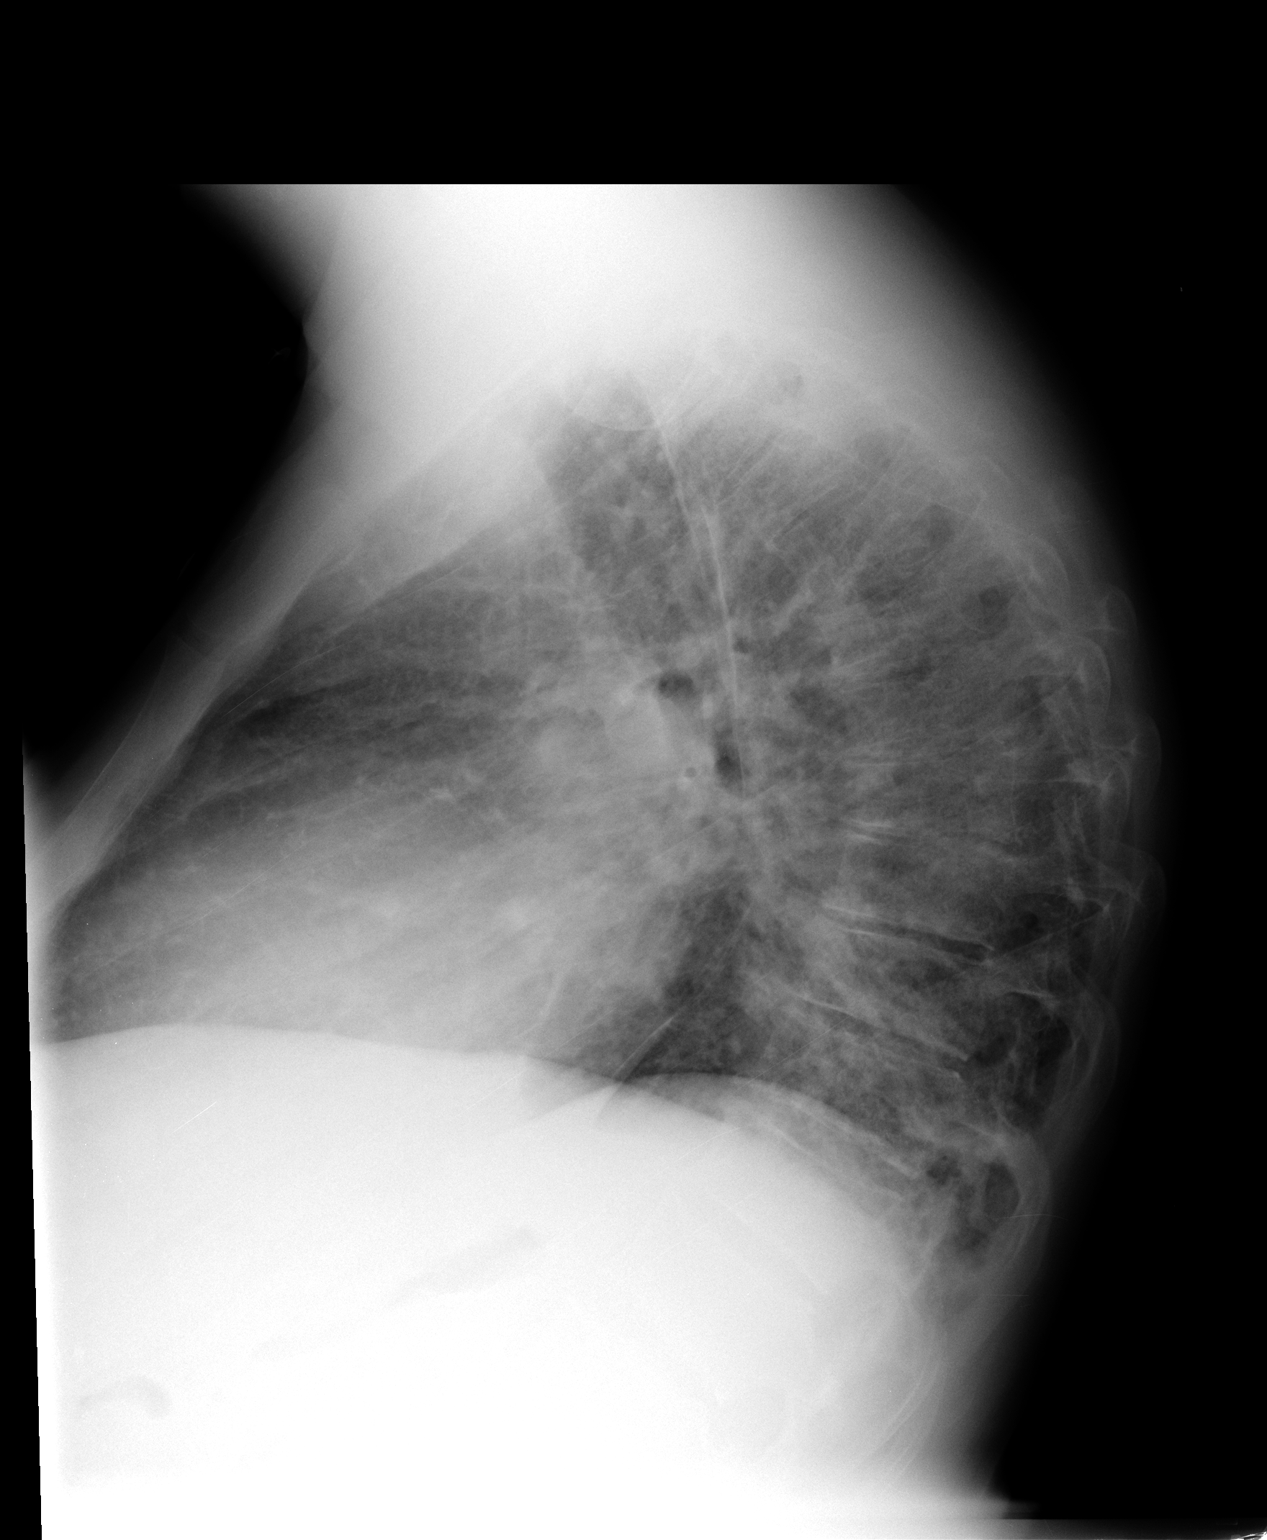

[2 of 2 positions shown; findings below may reference images not displayed]

FINDINGS: Heart size is enlarged.  Mild vascular congestion without
edema or effusion.  Mild bibasilar atelectasis.
IMPRESSION: Mild vascular congestion without edema.  Mild bibasilar
atelectasis.

## 2012-12-31 IMAGING — CR DG CHEST 1V PORT
1 series · 1 of 1 positions shown · non-contrast
Comparison: 06/16/2011

CLINICAL DATA: Short of breath, swelling, chronic leg pain.

PORTABLE CHEST - 1 VIEW

[view not recorded]
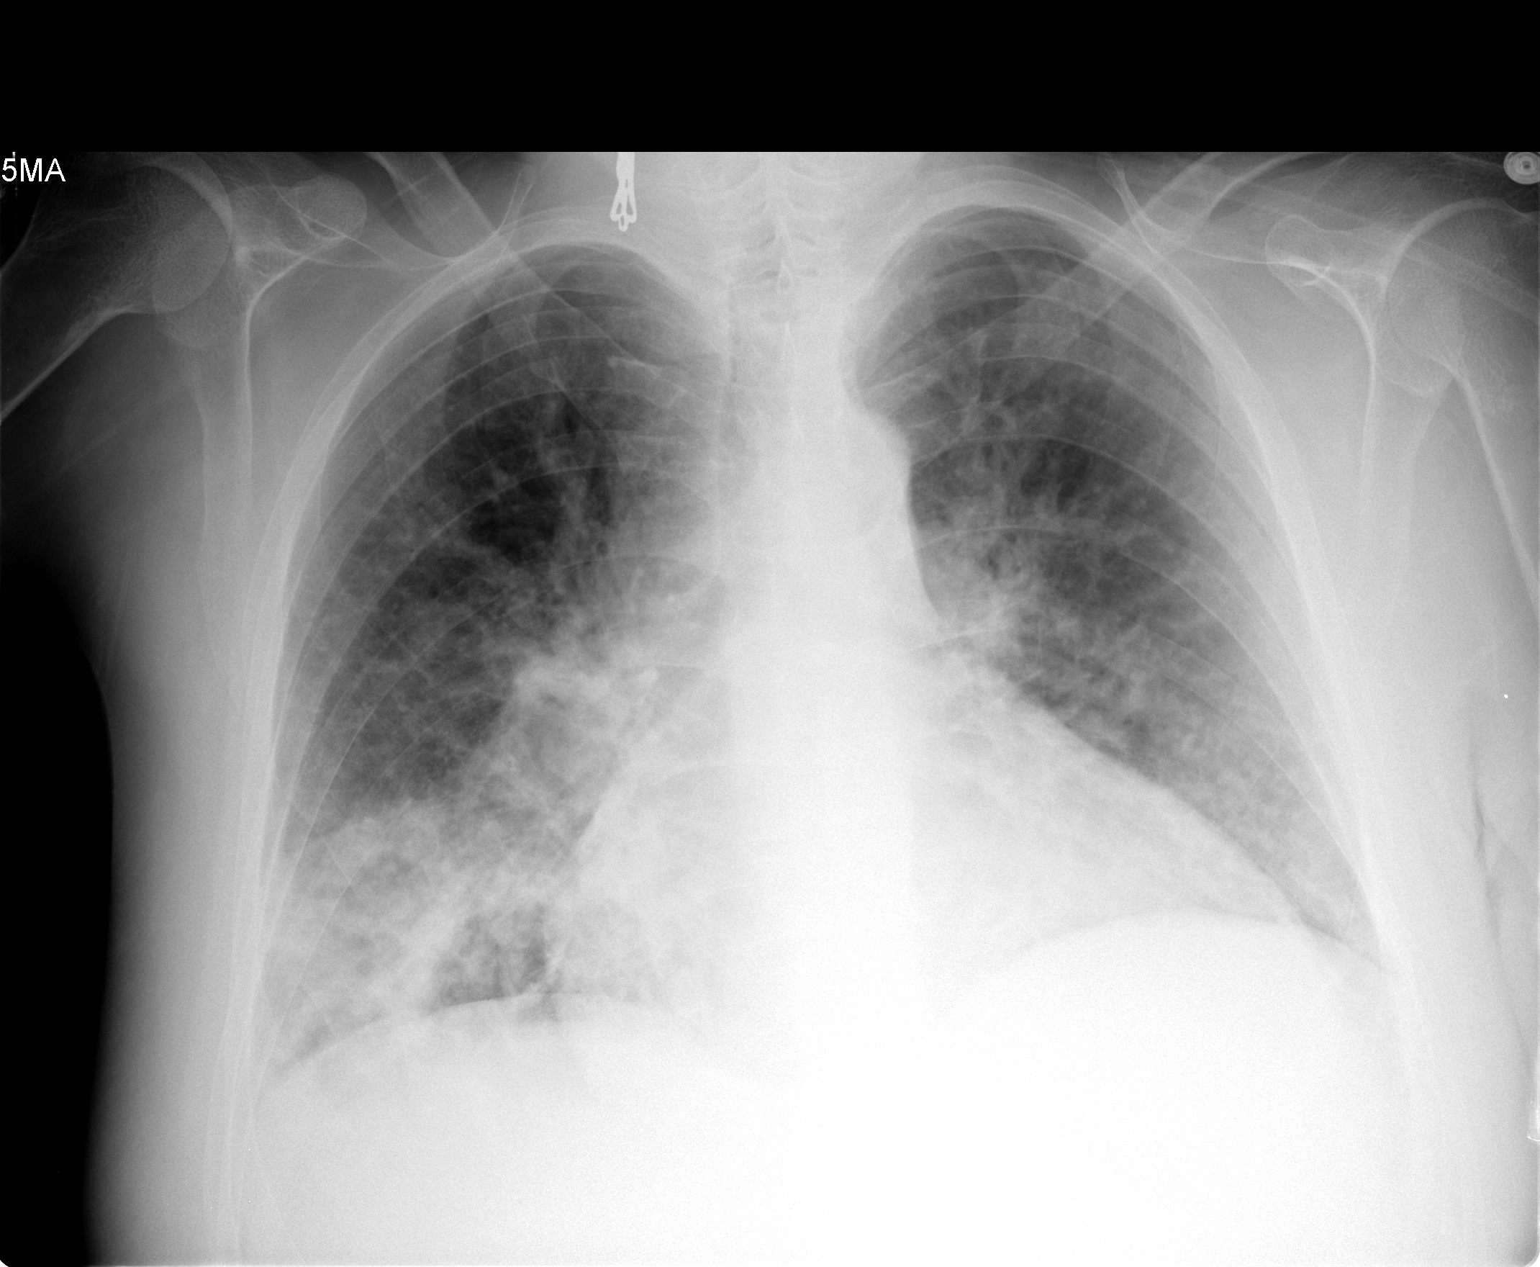

[1 of 1 positions shown; findings below may reference images not displayed]

FINDINGS: Cardiac enlargement with pulmonary vascular congestion
similar to the previous study.  There is interval development of
airspace disease in the right lung base suggesting superimposed
developing pneumonia.  No blunting of costophrenic angles.  No
pneumothorax.  Calcification of the aorta.
IMPRESSION: Cardiac enlargement with pulmonary vascular congestion similar to
previous study.  Developing airspace disease in the right lower
lung consistent with pneumonia.

## 2013-01-14 ENCOUNTER — Emergency Department (HOSPITAL_COMMUNITY): Payer: Medicare Other

## 2013-01-14 ENCOUNTER — Emergency Department (HOSPITAL_COMMUNITY)
Admission: EM | Admit: 2013-01-14 | Discharge: 2013-01-14 | Disposition: A | Discharge status: Home / Self Care | Payer: Medicare Other | Attending: Emergency Medicine | Admitting: Emergency Medicine

## 2013-01-14 ENCOUNTER — Other Ambulatory Visit: Payer: Self-pay | Admitting: Gastroenterology

## 2013-01-14 ENCOUNTER — Encounter (HOSPITAL_COMMUNITY): Payer: Self-pay | Admitting: *Deleted

## 2013-01-14 DIAGNOSIS — Z992 Dependence on renal dialysis: Secondary | ICD-10-CM | POA: Insufficient documentation

## 2013-01-14 DIAGNOSIS — F209 Schizophrenia, unspecified: Secondary | ICD-10-CM | POA: Insufficient documentation

## 2013-01-14 DIAGNOSIS — R079 Chest pain, unspecified: Secondary | ICD-10-CM | POA: Insufficient documentation

## 2013-01-14 DIAGNOSIS — Z87828 Personal history of other (healed) physical injury and trauma: Secondary | ICD-10-CM | POA: Insufficient documentation

## 2013-01-14 DIAGNOSIS — R059 Cough, unspecified: Secondary | ICD-10-CM | POA: Insufficient documentation

## 2013-01-14 DIAGNOSIS — R05 Cough: Secondary | ICD-10-CM | POA: Insufficient documentation

## 2013-01-14 DIAGNOSIS — G894 Chronic pain syndrome: Secondary | ICD-10-CM | POA: Insufficient documentation

## 2013-01-14 DIAGNOSIS — I120 Hypertensive chronic kidney disease with stage 5 chronic kidney disease or end stage renal disease: Secondary | ICD-10-CM | POA: Insufficient documentation

## 2013-01-14 DIAGNOSIS — F319 Bipolar disorder, unspecified: Secondary | ICD-10-CM | POA: Insufficient documentation

## 2013-01-14 DIAGNOSIS — R062 Wheezing: Secondary | ICD-10-CM | POA: Insufficient documentation

## 2013-01-14 DIAGNOSIS — R093 Abnormal sputum: Secondary | ICD-10-CM | POA: Insufficient documentation

## 2013-01-14 DIAGNOSIS — R0602 Shortness of breath: Secondary | ICD-10-CM | POA: Insufficient documentation

## 2013-01-14 DIAGNOSIS — D649 Anemia, unspecified: Secondary | ICD-10-CM

## 2013-01-14 DIAGNOSIS — F17200 Nicotine dependence, unspecified, uncomplicated: Secondary | ICD-10-CM | POA: Insufficient documentation

## 2013-01-14 DIAGNOSIS — M7989 Other specified soft tissue disorders: Secondary | ICD-10-CM | POA: Insufficient documentation

## 2013-01-14 DIAGNOSIS — N186 End stage renal disease: Secondary | ICD-10-CM | POA: Insufficient documentation

## 2013-01-14 DIAGNOSIS — J449 Chronic obstructive pulmonary disease, unspecified: Secondary | ICD-10-CM | POA: Insufficient documentation

## 2013-01-14 DIAGNOSIS — Z7982 Long term (current) use of aspirin: Secondary | ICD-10-CM | POA: Insufficient documentation

## 2013-01-14 DIAGNOSIS — Z79899 Other long term (current) drug therapy: Secondary | ICD-10-CM | POA: Insufficient documentation

## 2013-01-14 DIAGNOSIS — G43909 Migraine, unspecified, not intractable, without status migrainosus: Secondary | ICD-10-CM | POA: Insufficient documentation

## 2013-01-14 DIAGNOSIS — Z9861 Coronary angioplasty status: Secondary | ICD-10-CM | POA: Insufficient documentation

## 2013-01-14 DIAGNOSIS — I509 Heart failure, unspecified: Secondary | ICD-10-CM | POA: Insufficient documentation

## 2013-01-14 DIAGNOSIS — J4489 Other specified chronic obstructive pulmonary disease: Secondary | ICD-10-CM | POA: Insufficient documentation

## 2013-01-14 LAB — BASIC METABOLIC PANEL
Calcium: 10 mg/dL (ref 8.4–10.5)
GFR calc Af Amer: 17 mL/min — ABNORMAL LOW (ref >90)
GFR calc non Af Amer: 14 mL/min — ABNORMAL LOW (ref >90)
Potassium: 3.9 mEq/L (ref 3.5–5.1)
Sodium: 137 mEq/L (ref 135–145)

## 2013-01-14 LAB — TROPONIN I: Troponin I: <0.3 ng/mL (ref <0.30)

## 2013-01-14 LAB — CBC
MCH: 32.1 pg (ref 26.0–34.0)
MCHC: 32.9 g/dL (ref 30.0–36.0)
RDW: 16.3 % — ABNORMAL HIGH (ref 11.5–15.5)

## 2013-01-14 MED ORDER — IPRATROPIUM BROMIDE 0.02 % IN SOLN
0.5000 mg | Freq: Once | RESPIRATORY_TRACT | Status: AC
  Administered 2013-01-14: 0.5 mg via RESPIRATORY_TRACT
  Filled 2013-01-14: qty 2.5

## 2013-01-14 MED ORDER — ASPIRIN 81 MG PO CHEW
324.0000 mg | CHEWABLE_TABLET | Freq: Once | ORAL | Status: AC
  Administered 2013-01-14: 324 mg via ORAL
  Filled 2013-01-14: qty 4

## 2013-01-14 MED ORDER — FAMOTIDINE 20 MG PO TABS
20.0000 mg | ORAL_TABLET | Freq: Once | ORAL | Status: AC
  Administered 2013-01-14: 20 mg via ORAL
  Filled 2013-01-14: qty 1

## 2013-01-14 MED ORDER — MORPHINE SULFATE 4 MG/ML IJ SOLN
4.0000 mg | Freq: Once | INTRAMUSCULAR | Status: AC
  Administered 2013-01-14: 4 mg via INTRAVENOUS
  Filled 2013-01-14: qty 1

## 2013-01-14 MED ORDER — ALBUTEROL SULFATE (5 MG/ML) 0.5% IN NEBU
5.0000 mg | INHALATION_SOLUTION | Freq: Once | RESPIRATORY_TRACT | Status: AC
  Administered 2013-01-14: 5 mg via RESPIRATORY_TRACT
  Filled 2013-01-14: qty 1

## 2013-01-14 MED ORDER — HYDROMORPHONE HCL PF 1 MG/ML IJ SOLN
1.0000 mg | Freq: Once | INTRAMUSCULAR | Status: AC
  Administered 2013-01-14: 1 mg via INTRAVENOUS
  Filled 2013-01-14: qty 1

## 2013-01-14 NOTE — ED Notes (Signed)
Cp across anterior chest radiating to both arms that stated this morning.  C/o sob, denies n/v.

## 2013-01-14 NOTE — ED Provider Notes (Signed)
Pt discussed with me   Medical screening examination/treatment/procedure(s) were performed by non-physician practitioner and as supervising physician I was immediately available for consultation/collaboration.  Devoria Albe, MD, Armando Gang   Ward Givens, MD 01/14/13 [phone]
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
## 2013-01-14 NOTE — ED Notes (Signed)
Burgess Amor, PA-C notified of lack of pain relief.

## 2013-01-14 NOTE — ED Notes (Signed)
Patient with no complaints at this time. Respirations even and unlabored. Skin warm/dry. Discharge instructions reviewed with patient at this time. Patient given opportunity to voice concerns/ask questions. IV removed per policy and band-aid applied to site. Patient discharged at this time and left Emergency Department with steady gait.  

## 2013-01-21 ENCOUNTER — Encounter (HOSPITAL_COMMUNITY): Payer: Self-pay | Admitting: Emergency Medicine

## 2013-01-21 ENCOUNTER — Emergency Department (HOSPITAL_COMMUNITY)
Admission: EM | Admit: 2013-01-21 | Discharge: 2013-01-21 | Disposition: A | Discharge status: Home / Self Care | Payer: Medicare Other | Attending: Emergency Medicine | Admitting: Emergency Medicine

## 2013-01-21 ENCOUNTER — Emergency Department (HOSPITAL_COMMUNITY): Payer: Medicare Other

## 2013-01-21 DIAGNOSIS — N186 End stage renal disease: Secondary | ICD-10-CM | POA: Insufficient documentation

## 2013-01-21 DIAGNOSIS — F17200 Nicotine dependence, unspecified, uncomplicated: Secondary | ICD-10-CM | POA: Insufficient documentation

## 2013-01-21 DIAGNOSIS — R109 Unspecified abdominal pain: Secondary | ICD-10-CM | POA: Insufficient documentation

## 2013-01-21 DIAGNOSIS — J449 Chronic obstructive pulmonary disease, unspecified: Secondary | ICD-10-CM | POA: Insufficient documentation

## 2013-01-21 DIAGNOSIS — Z862 Personal history of diseases of the blood and blood-forming organs and certain disorders involving the immune mechanism: Secondary | ICD-10-CM | POA: Insufficient documentation

## 2013-01-21 DIAGNOSIS — Y9289 Other specified places as the place of occurrence of the external cause: Secondary | ICD-10-CM | POA: Insufficient documentation

## 2013-01-21 DIAGNOSIS — Z79899 Other long term (current) drug therapy: Secondary | ICD-10-CM | POA: Insufficient documentation

## 2013-01-21 DIAGNOSIS — G8929 Other chronic pain: Secondary | ICD-10-CM | POA: Insufficient documentation

## 2013-01-21 DIAGNOSIS — Z9861 Coronary angioplasty status: Secondary | ICD-10-CM | POA: Insufficient documentation

## 2013-01-21 DIAGNOSIS — M25562 Pain in left knee: Secondary | ICD-10-CM

## 2013-01-21 DIAGNOSIS — F209 Schizophrenia, unspecified: Secondary | ICD-10-CM | POA: Insufficient documentation

## 2013-01-21 DIAGNOSIS — J4489 Other specified chronic obstructive pulmonary disease: Secondary | ICD-10-CM | POA: Insufficient documentation

## 2013-01-21 DIAGNOSIS — S8990XA Unspecified injury of unspecified lower leg, initial encounter: Secondary | ICD-10-CM | POA: Insufficient documentation

## 2013-01-21 DIAGNOSIS — Z7982 Long term (current) use of aspirin: Secondary | ICD-10-CM | POA: Insufficient documentation

## 2013-01-21 DIAGNOSIS — Z87828 Personal history of other (healed) physical injury and trauma: Secondary | ICD-10-CM | POA: Insufficient documentation

## 2013-01-21 DIAGNOSIS — S99929A Unspecified injury of unspecified foot, initial encounter: Secondary | ICD-10-CM | POA: Insufficient documentation

## 2013-01-21 DIAGNOSIS — I509 Heart failure, unspecified: Secondary | ICD-10-CM | POA: Insufficient documentation

## 2013-01-21 DIAGNOSIS — G43909 Migraine, unspecified, not intractable, without status migrainosus: Secondary | ICD-10-CM | POA: Insufficient documentation

## 2013-01-21 DIAGNOSIS — G894 Chronic pain syndrome: Secondary | ICD-10-CM | POA: Insufficient documentation

## 2013-01-21 DIAGNOSIS — I120 Hypertensive chronic kidney disease with stage 5 chronic kidney disease or end stage renal disease: Secondary | ICD-10-CM | POA: Insufficient documentation

## 2013-01-21 DIAGNOSIS — X500XXA Overexertion from strenuous movement or load, initial encounter: Secondary | ICD-10-CM | POA: Insufficient documentation

## 2013-01-21 DIAGNOSIS — Y939 Activity, unspecified: Secondary | ICD-10-CM | POA: Insufficient documentation

## 2013-01-21 DIAGNOSIS — Z992 Dependence on renal dialysis: Secondary | ICD-10-CM | POA: Insufficient documentation

## 2013-01-21 DIAGNOSIS — W010XXA Fall on same level from slipping, tripping and stumbling without subsequent striking against object, initial encounter: Secondary | ICD-10-CM | POA: Insufficient documentation

## 2013-01-21 DIAGNOSIS — F319 Bipolar disorder, unspecified: Secondary | ICD-10-CM | POA: Insufficient documentation

## 2013-01-21 MED ORDER — HYDROMORPHONE HCL PF 1 MG/ML IJ SOLN
1.0000 mg | Freq: Once | INTRAMUSCULAR | Status: AC
  Administered 2013-01-21: 1 mg via INTRAMUSCULAR
  Filled 2013-01-21: qty 1

## 2013-01-21 NOTE — ED Notes (Signed)
Pt c/o left knee pain that began this morning. Pt states he slipped in shower and twisted knee. Pt is ambulatory.
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
## 2013-01-21 NOTE — ED Notes (Signed)
Patient c/o left knee pain. Per patient fell this morning in the shower. Per patient slipped on soap.

## 2013-01-22 NOTE — ED Provider Notes (Signed)
Medical screening examination/treatment/procedure(s) were performed by non-physician practitioner and as supervising physician I was immediately available for consultation/collaboration.   Benny Lennert, MD 01/22/13 1257

## 2013-01-29 IMAGING — CR DG CHEST 1V PORT
1 series · 1 of 1 positions shown · non-contrast
Comparison: 06/18/2011

CLINICAL DATA: Shortness of breath and renal failure

PORTABLE CHEST - 1 VIEW

[view not recorded]
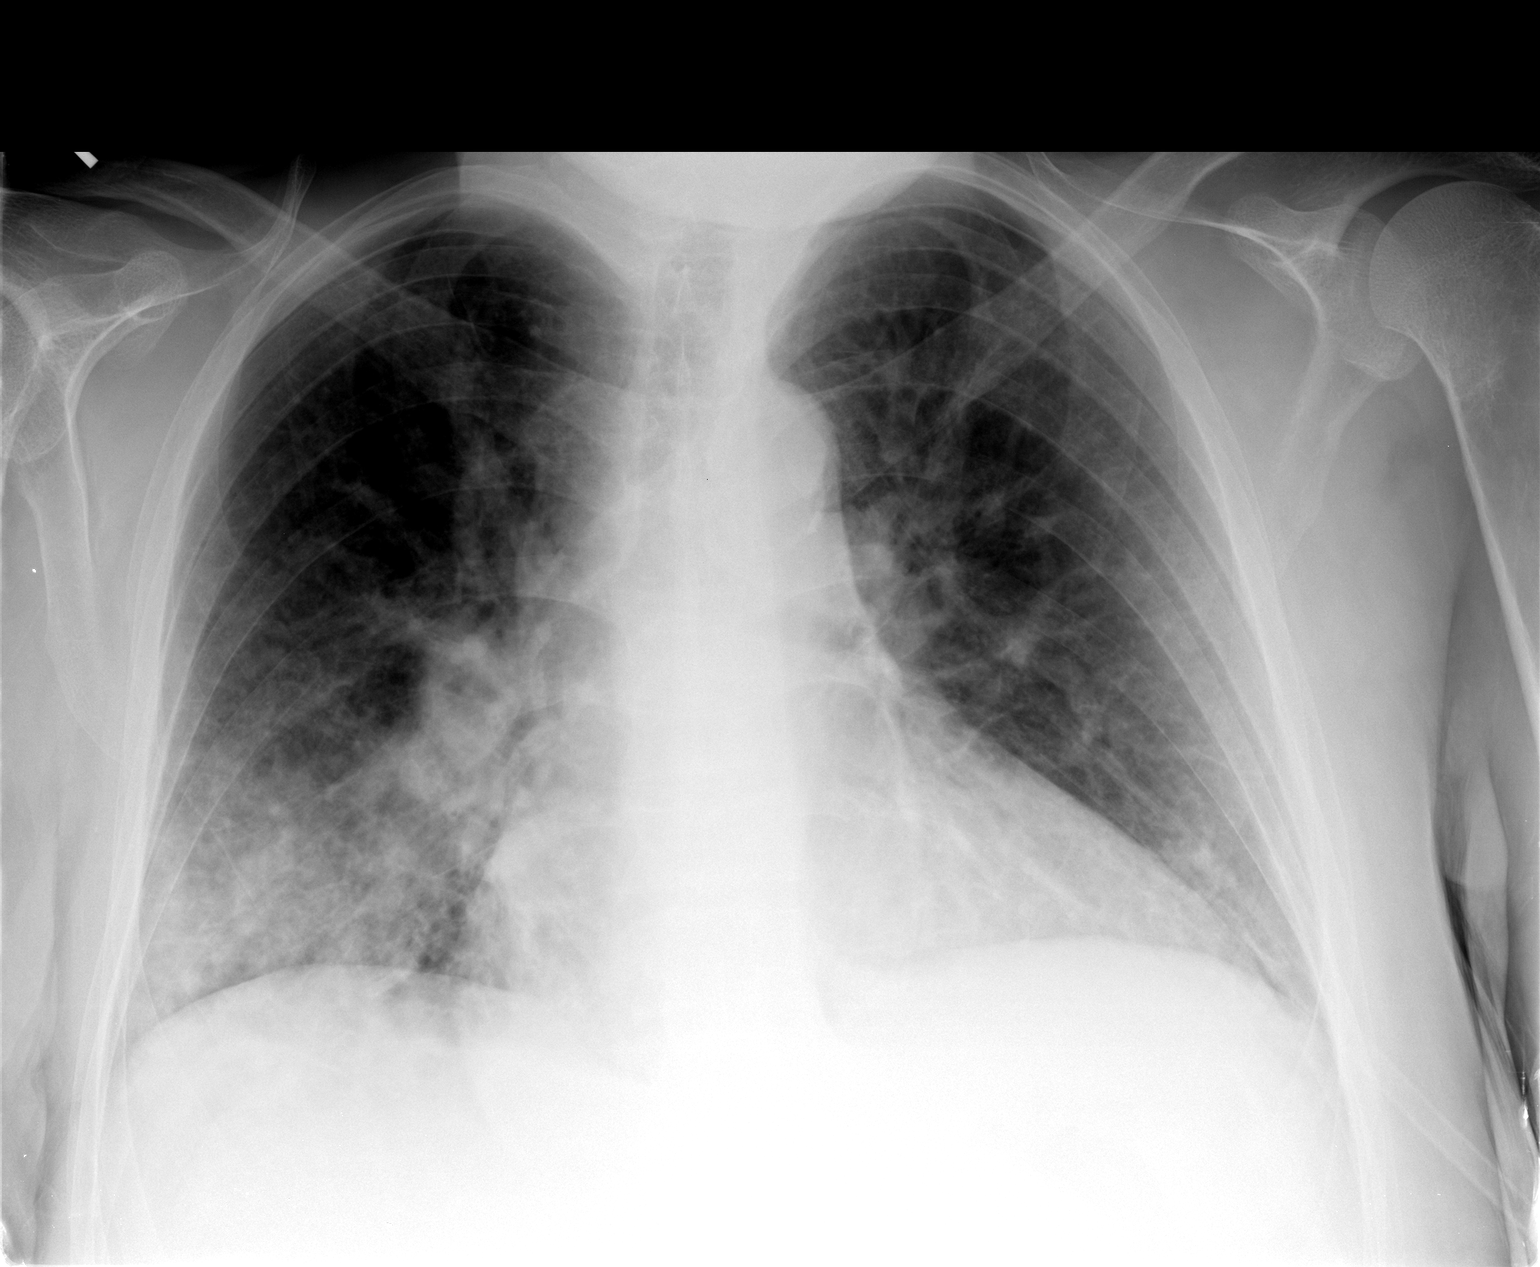

[1 of 1 positions shown; findings below may reference images not displayed]

FINDINGS: Heart size is mildly enlarged.

There is mild diffuse interstitial edema.

Airspace consolidation within the right lower lobe is identified
which is unchanged from previous exam.
IMPRESSION: 1.  Right lower lobe airspace consolidation.
2.  Mild diffuse edema.

## 2013-02-05 IMAGING — CR DG CHEST 1V PORT
1 series · 1 of 1 positions shown · non-contrast
Comparison: 07/17/2011

CLINICAL DATA: Short of breath

PORTABLE CHEST - 1 VIEW

[view not recorded]
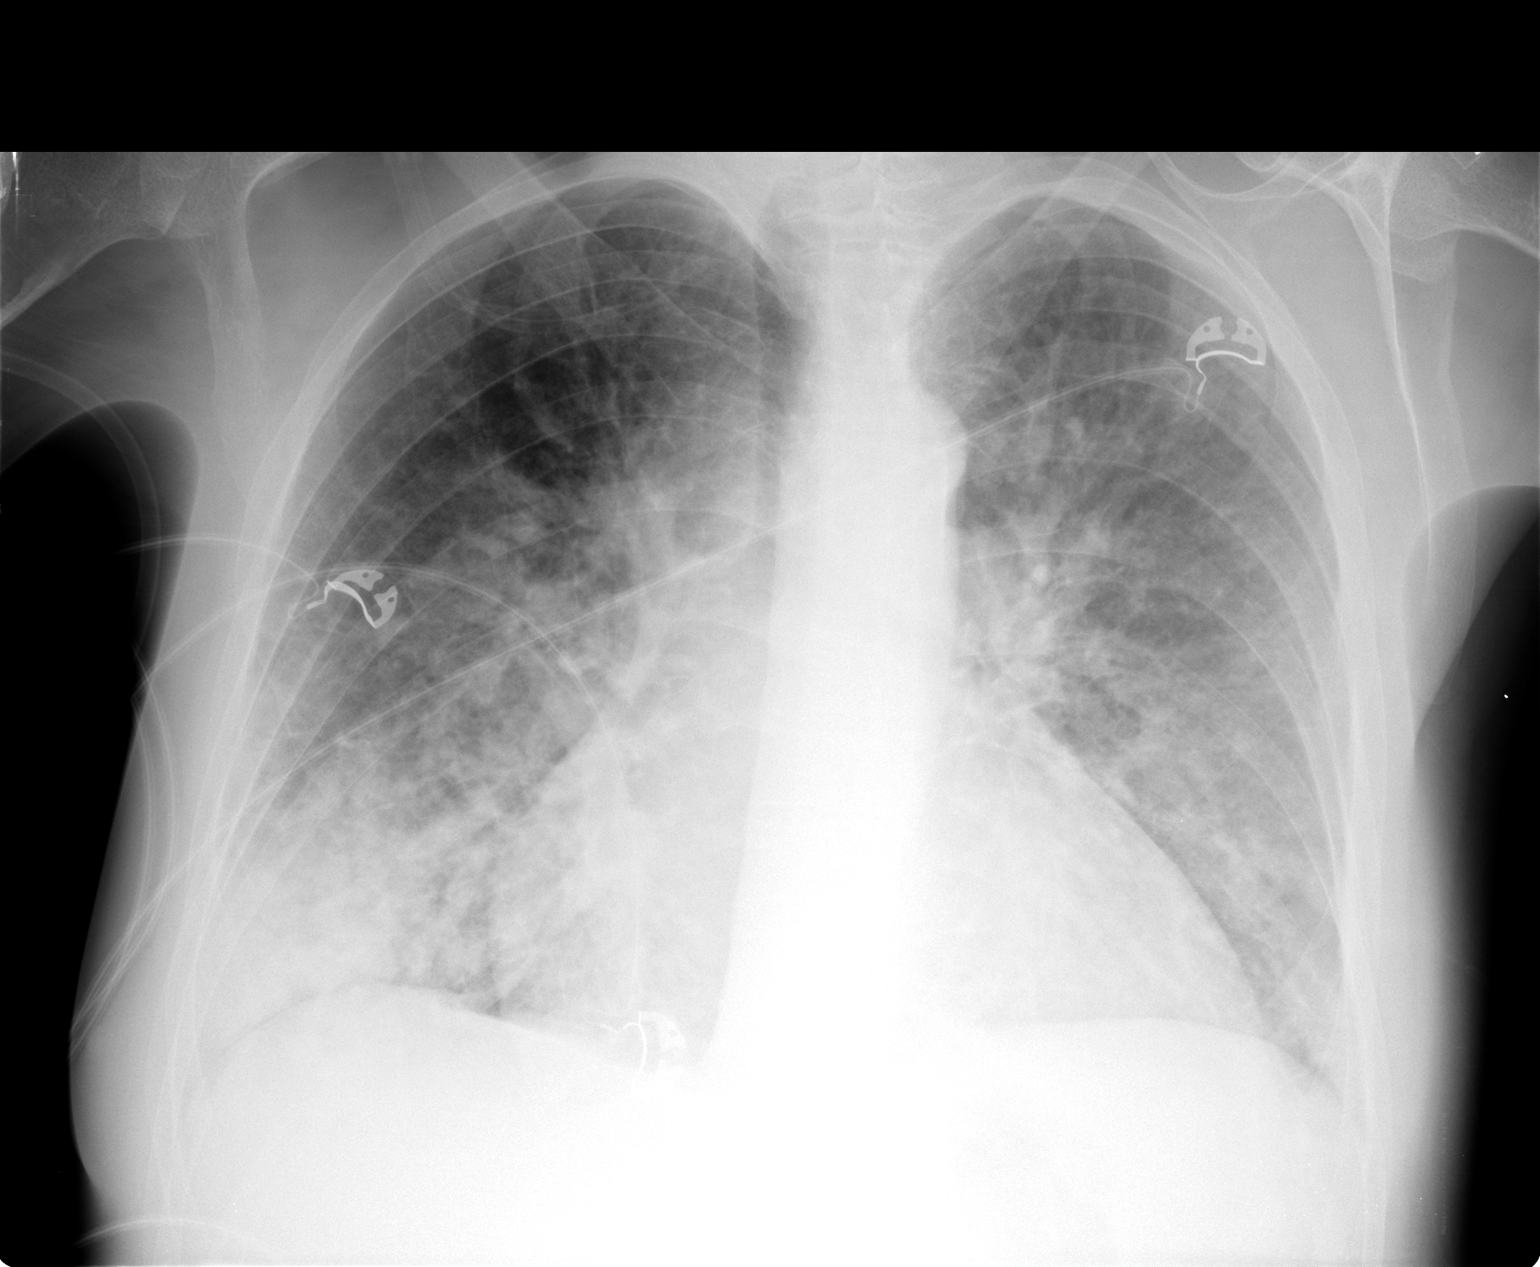

[1 of 1 positions shown; findings below may reference images not displayed]

FINDINGS: Diffuse airspace edema, worse.  Moderate cardiomegaly.
No pneumothorax.  No pleural fluid
IMPRESSION: Worsening CHF.

## 2013-02-13 IMAGING — CR DG LUMBAR SPINE COMPLETE 4+V
5 series · 5 of 5 positions shown · non-contrast
Comparison: Bone window images from CT abdomen and pelvis
06/11/2011.  Lumbar spine x-rays 02/03/2011.

CLINICAL DATA: Fell and injured the low back.

LUMBAR SPINE - COMPLETE 4+ VIEW 08/01/2011:

[view not recorded (1 of 5)]
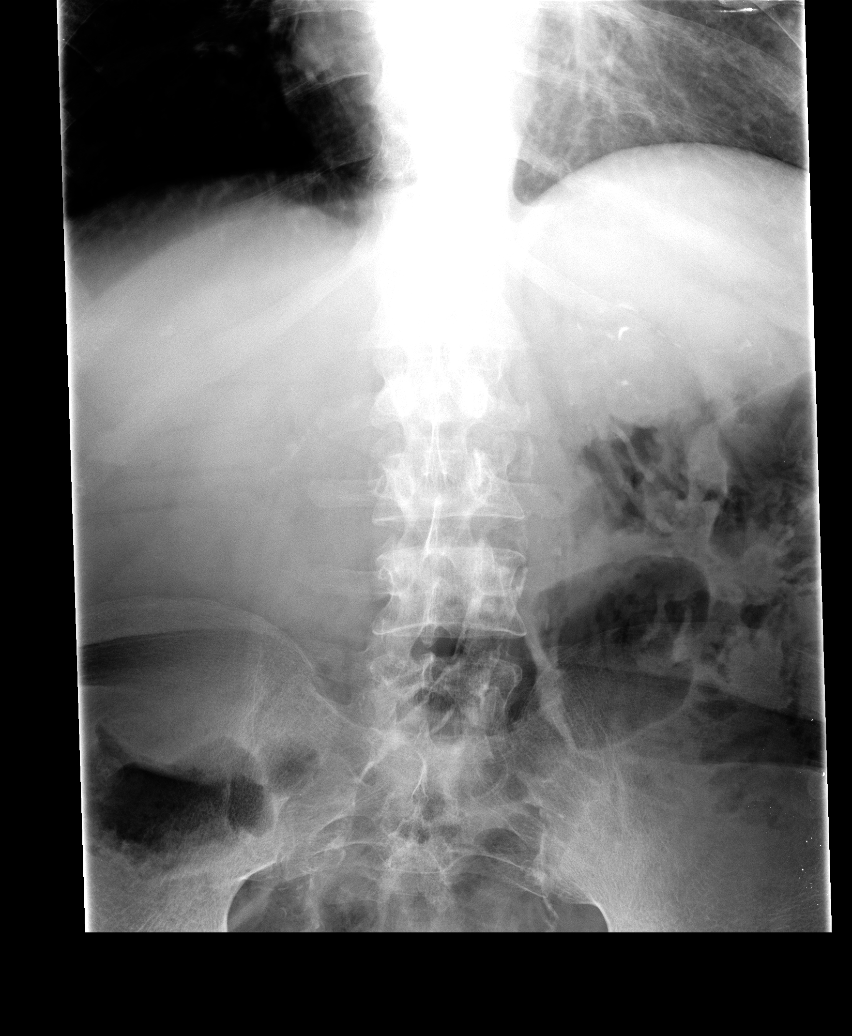

[view not recorded (2 of 5)]
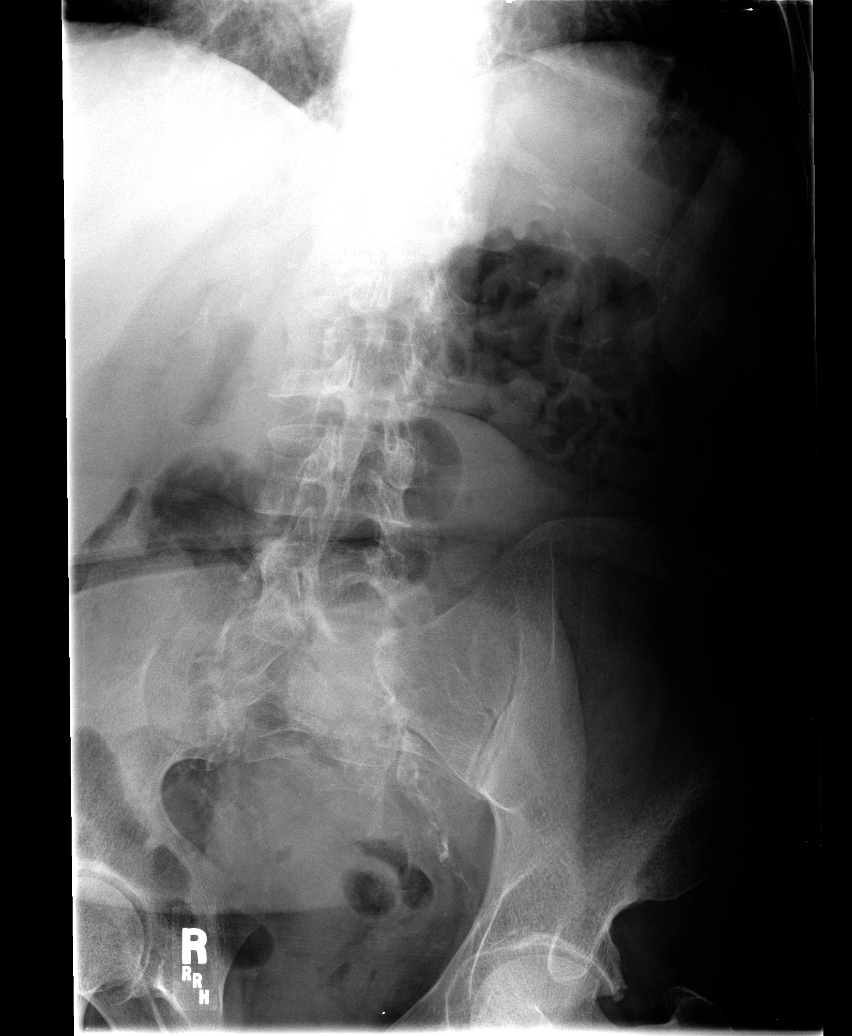

[view not recorded (3 of 5)]
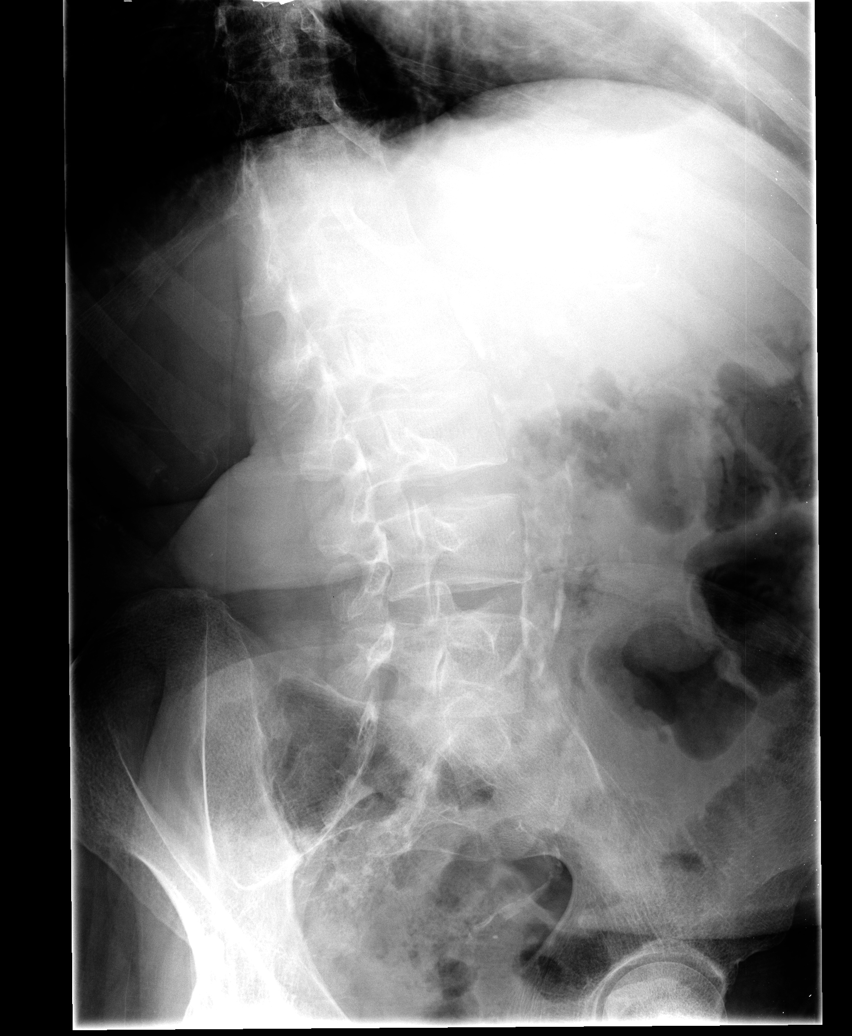

[view not recorded (4 of 5)]
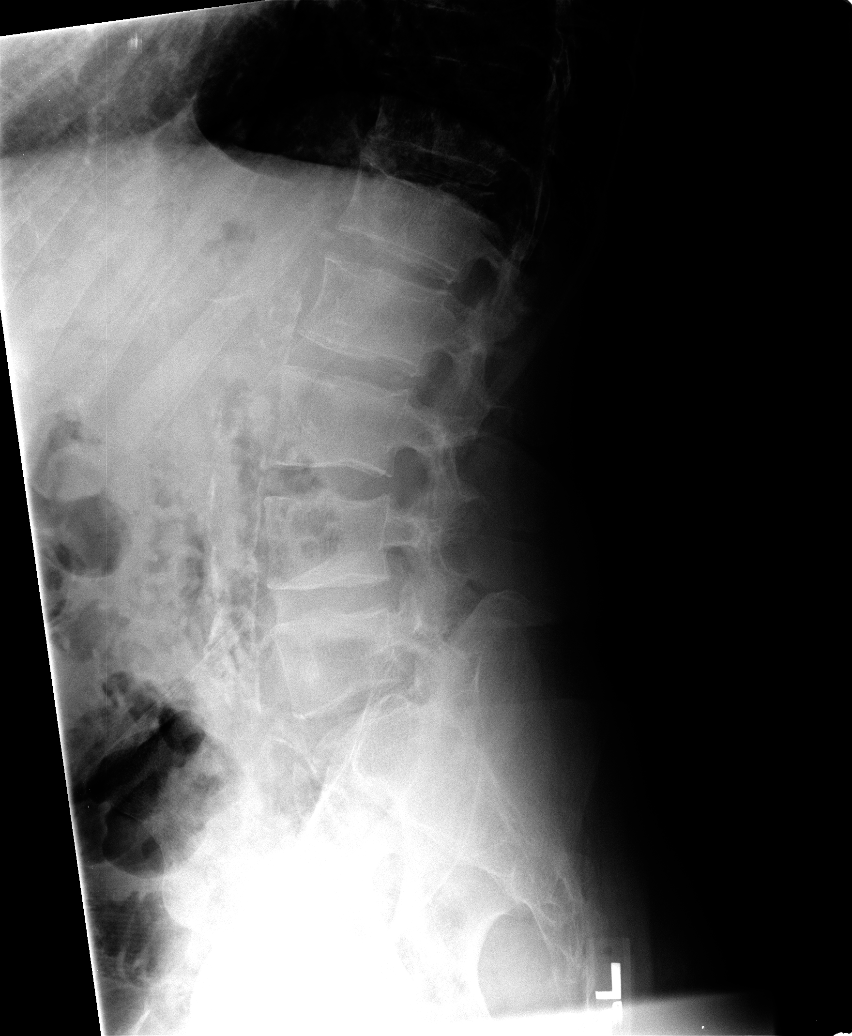

[view not recorded (5 of 5)]
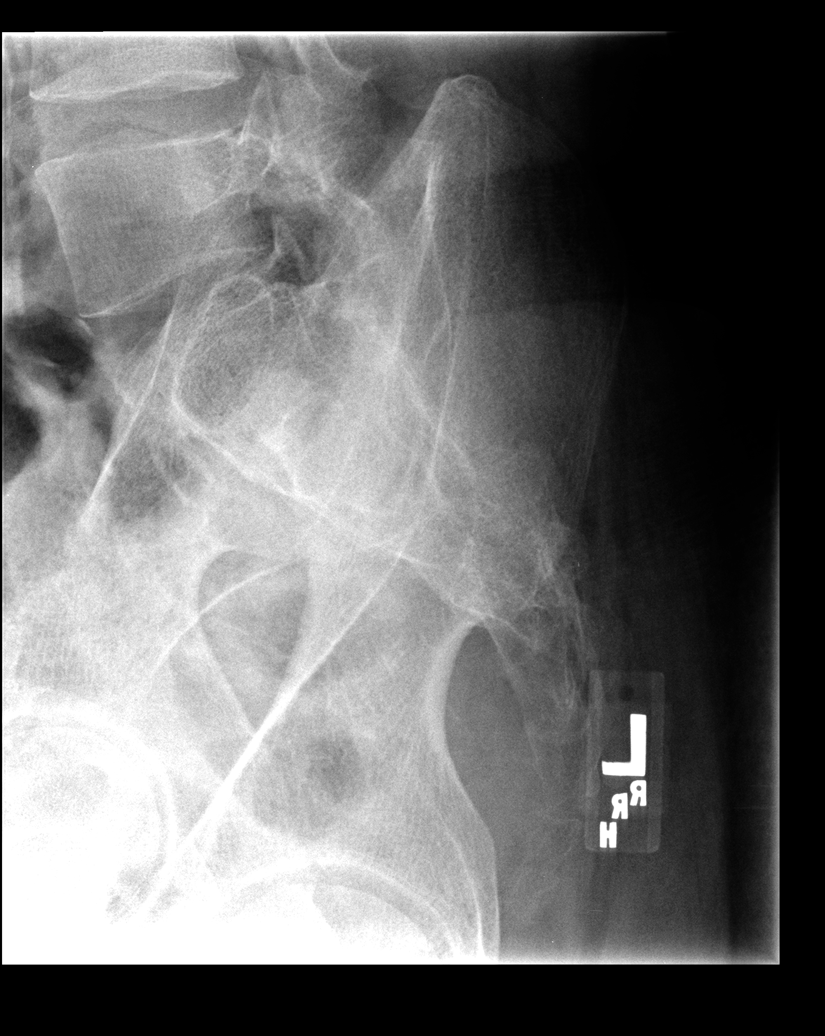

[5 of 5 positions shown; findings below may reference images not displayed]

FINDINGS: Five non-rib bearing lumbar vertebrae with anatomic
alignment.  No fractures.  Congenital irregularity of the endplates
of the lower thoracic spine and L1 and L2, unchanged.  Well-
preserved disc spaces.  No pars defects or significant facet
arthropathy.  Visualized sacroiliac joints intact.  Aorto-iliac
atherosclerosis without aneurysm, very advanced for age.
IMPRESSION: 1.  No acute or significant abnormalities involving the lumbar
spine.
2.  Aorto-iliac atherosclerosis, very advanced for age.

## 2013-02-13 IMAGING — CR DG CHEST 1V PORT
1 series · 1 of 1 positions shown · non-contrast
Comparison: Portable exam 3804 hours compared 07/24/2011

CLINICAL DATA: Shortness of breath, COPD

PORTABLE CHEST - 1 VIEW

[view not recorded]
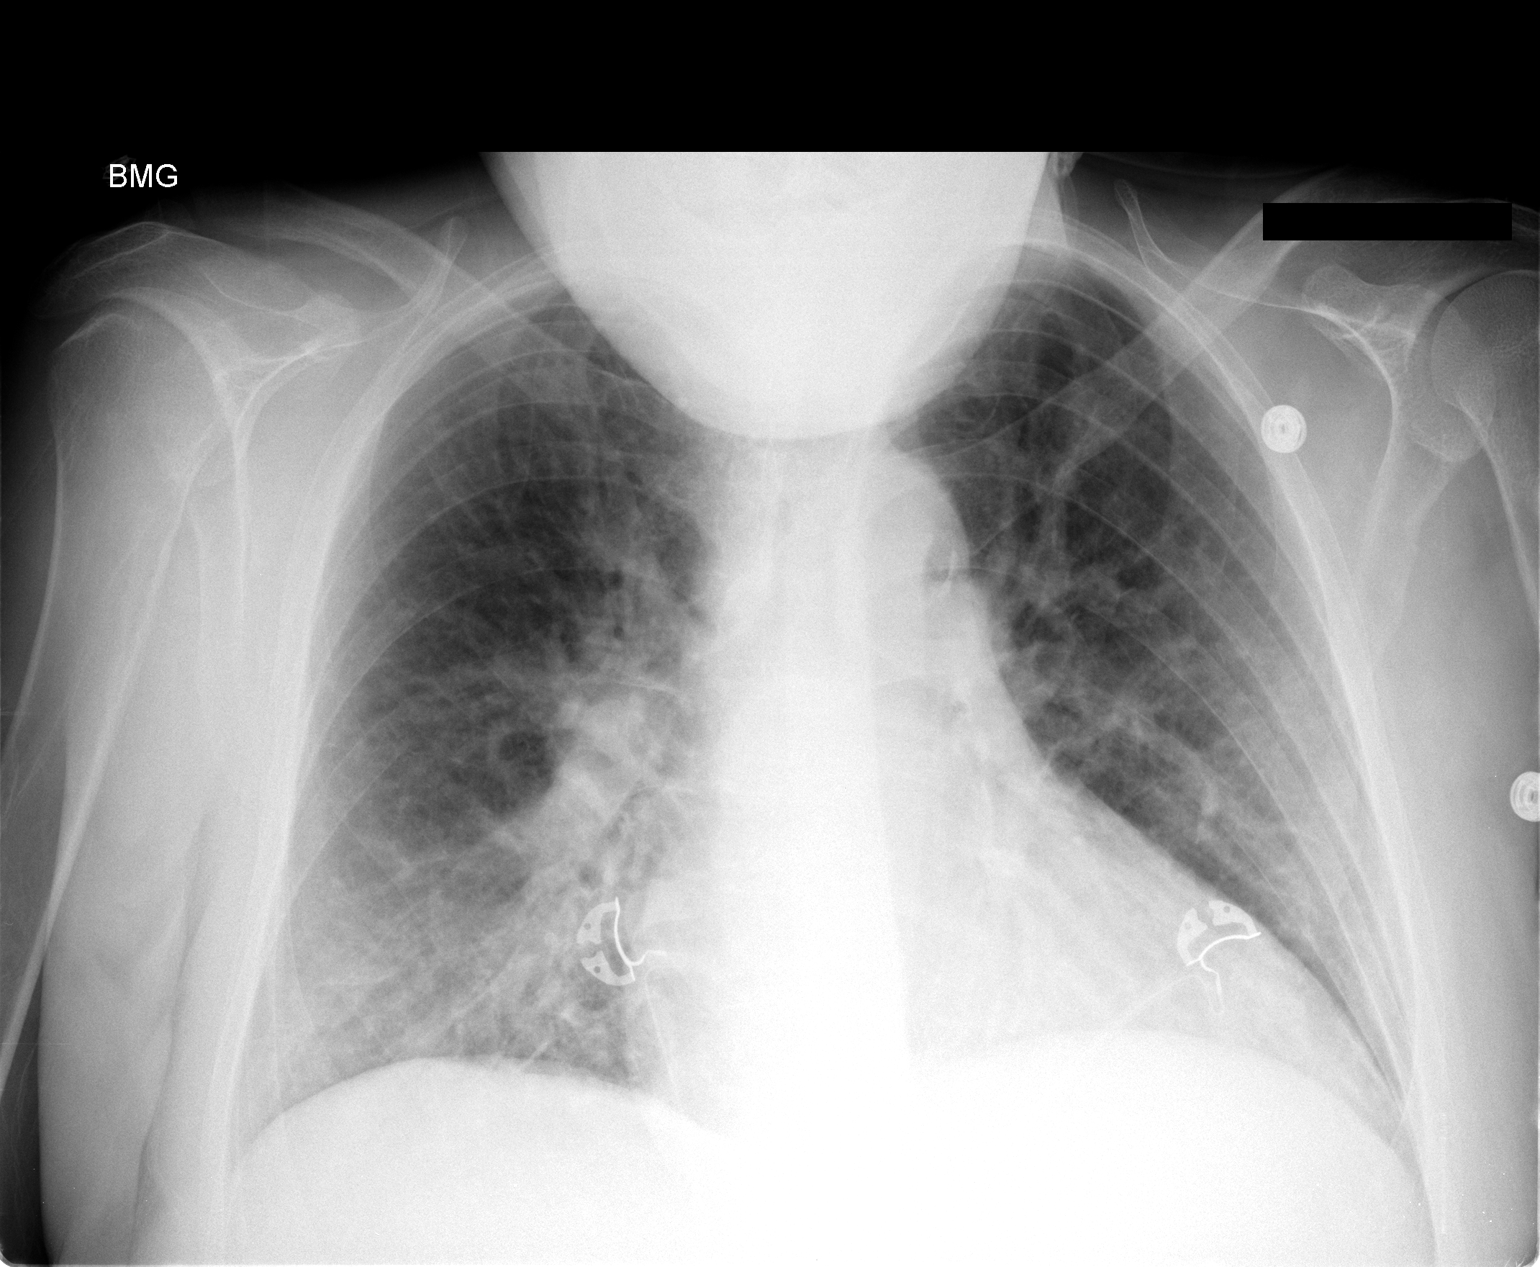

[1 of 1 positions shown; findings below may reference images not displayed]

FINDINGS: Enlargement of cardiac silhouette.
Pulmonary vascular congestion.
Atherosclerotic calcification aorta.
Improved pulmonary edema since previous exam.
Minimal residual atelectasis versus infiltrate right base.
Remaining lungs clear.
No gross effusion or pneumothorax.
Bones demineralized.
IMPRESSION: Mid improved CHF.

## 2013-02-17 IMAGING — CR DG CHEST 1V PORT
1 series · 1 of 1 positions shown · non-contrast
Comparison: 08/01/2011

CLINICAL DATA: Chest pain, shortness of breath

PORTABLE CHEST - 1 VIEW

[view not recorded]
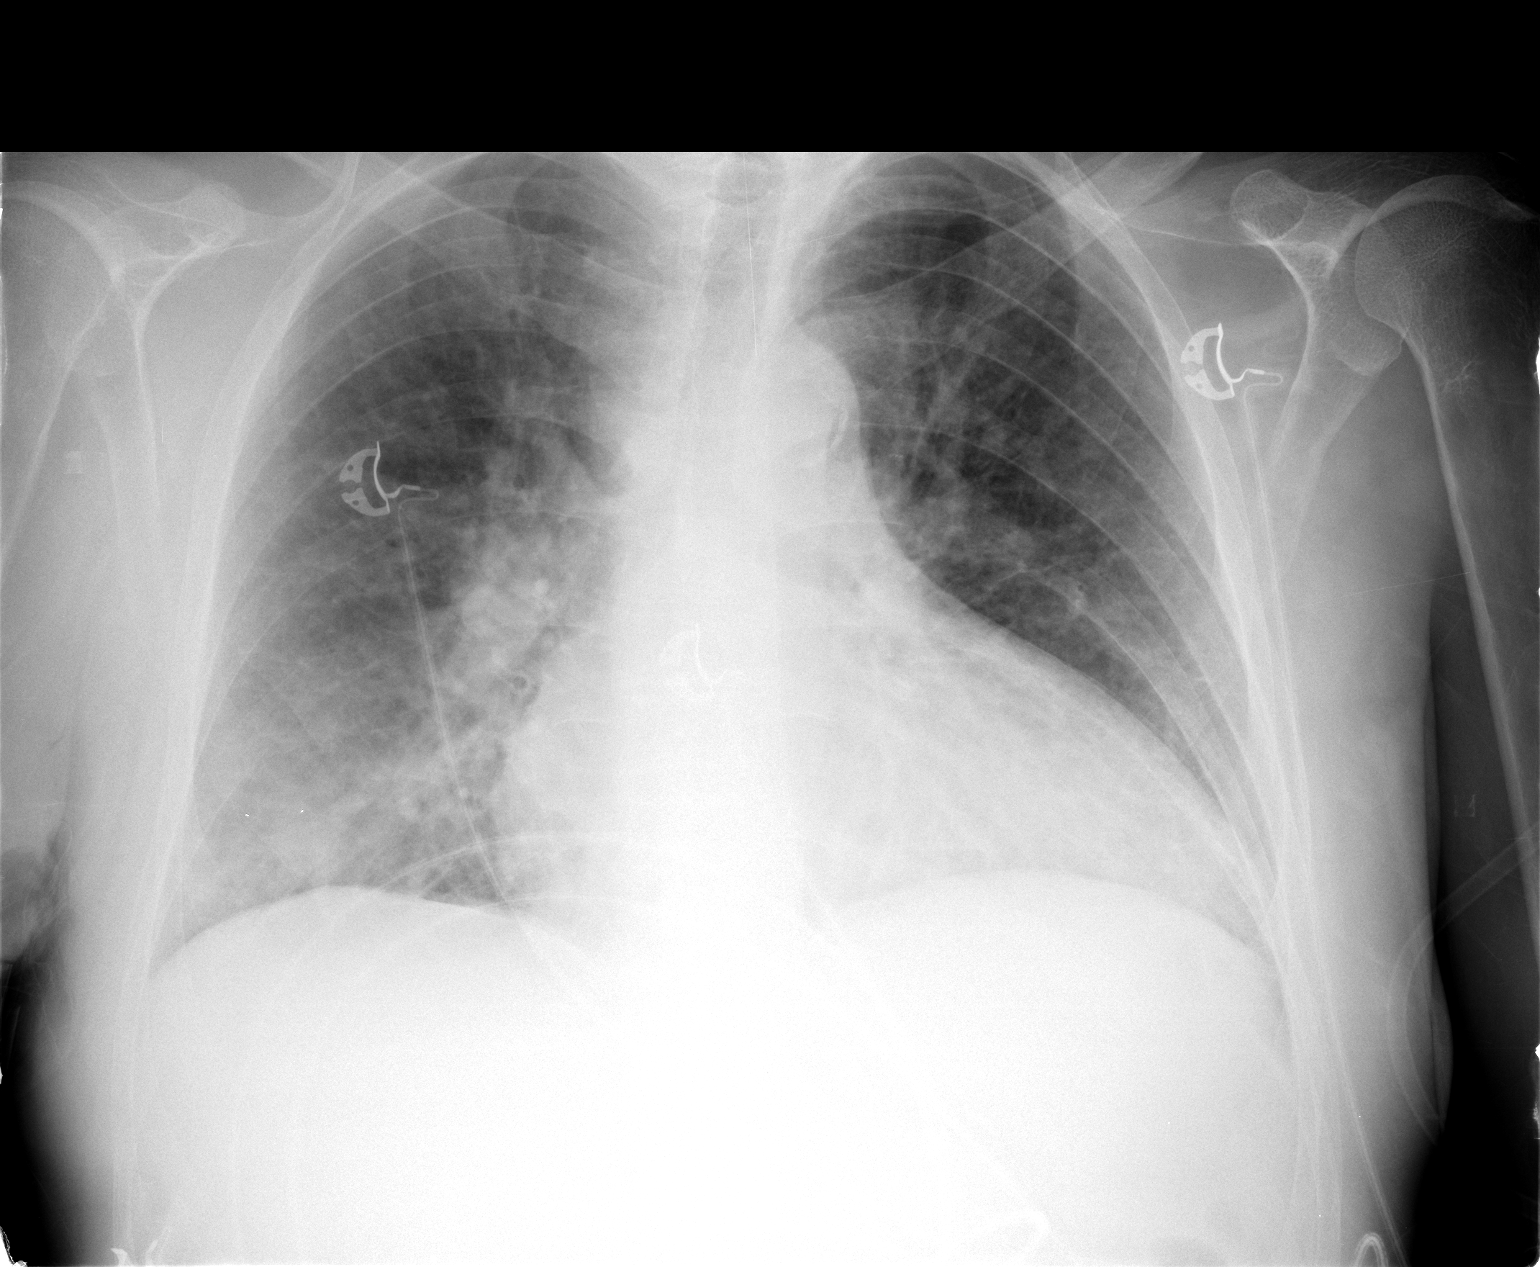

[1 of 1 positions shown; findings below may reference images not displayed]

FINDINGS: Cardiomegaly with pulmonary vascular congestion and
suspected mild interstitial edema.  Mild patchy opacity in the
right lower lobe is favored to represent asymmetric edema.

No pleural effusion or pneumothorax.
IMPRESSION: Cardiomegaly with suspected mild interstitial edema.

Mild patchy opacity in the right lower lobe, favored to represent
asymmetric edema.

## 2013-02-18 ENCOUNTER — Encounter (HOSPITAL_COMMUNITY): Payer: Self-pay

## 2013-02-18 ENCOUNTER — Emergency Department (HOSPITAL_COMMUNITY): Payer: Medicare Other

## 2013-02-18 ENCOUNTER — Inpatient Hospital Stay (HOSPITAL_COMMUNITY)
Admission: EM | Admit: 2013-02-18 | Discharge: 2013-02-24 | DRG: 193 | Discharge status: Against Medical Advice | Payer: Medicare Other | Attending: Family Medicine | Admitting: Family Medicine

## 2013-02-18 DIAGNOSIS — I509 Heart failure, unspecified: Secondary | ICD-10-CM | POA: Diagnosis present

## 2013-02-18 DIAGNOSIS — I2510 Atherosclerotic heart disease of native coronary artery without angina pectoris: Secondary | ICD-10-CM | POA: Diagnosis present

## 2013-02-18 DIAGNOSIS — Z992 Dependence on renal dialysis: Secondary | ICD-10-CM

## 2013-02-18 DIAGNOSIS — D631 Anemia in chronic kidney disease: Secondary | ICD-10-CM | POA: Diagnosis present

## 2013-02-18 DIAGNOSIS — E871 Hypo-osmolality and hyponatremia: Secondary | ICD-10-CM | POA: Diagnosis present

## 2013-02-18 DIAGNOSIS — G2581 Restless legs syndrome: Secondary | ICD-10-CM | POA: Diagnosis present

## 2013-02-18 DIAGNOSIS — F319 Bipolar disorder, unspecified: Secondary | ICD-10-CM | POA: Diagnosis present

## 2013-02-18 DIAGNOSIS — F209 Schizophrenia, unspecified: Secondary | ICD-10-CM | POA: Diagnosis present

## 2013-02-18 DIAGNOSIS — J189 Pneumonia, unspecified organism: Principal | ICD-10-CM | POA: Diagnosis present

## 2013-02-18 DIAGNOSIS — I2589 Other forms of chronic ischemic heart disease: Secondary | ICD-10-CM | POA: Diagnosis present

## 2013-02-18 DIAGNOSIS — N2581 Secondary hyperparathyroidism of renal origin: Secondary | ICD-10-CM | POA: Diagnosis present

## 2013-02-18 DIAGNOSIS — Z886 Allergy status to analgesic agent status: Secondary | ICD-10-CM

## 2013-02-18 DIAGNOSIS — G4700 Insomnia, unspecified: Secondary | ICD-10-CM | POA: Diagnosis present

## 2013-02-18 DIAGNOSIS — Z9861 Coronary angioplasty status: Secondary | ICD-10-CM

## 2013-02-18 DIAGNOSIS — Z885 Allergy status to narcotic agent status: Secondary | ICD-10-CM

## 2013-02-18 DIAGNOSIS — G894 Chronic pain syndrome: Secondary | ICD-10-CM | POA: Diagnosis present

## 2013-02-18 DIAGNOSIS — I120 Hypertensive chronic kidney disease with stage 5 chronic kidney disease or end stage renal disease: Secondary | ICD-10-CM | POA: Diagnosis present

## 2013-02-18 DIAGNOSIS — Z881 Allergy status to other antibiotic agents status: Secondary | ICD-10-CM

## 2013-02-18 DIAGNOSIS — Z7982 Long term (current) use of aspirin: Secondary | ICD-10-CM

## 2013-02-18 DIAGNOSIS — F17200 Nicotine dependence, unspecified, uncomplicated: Secondary | ICD-10-CM | POA: Diagnosis present

## 2013-02-18 DIAGNOSIS — N186 End stage renal disease: Secondary | ICD-10-CM | POA: Diagnosis present

## 2013-02-18 DIAGNOSIS — J449 Chronic obstructive pulmonary disease, unspecified: Secondary | ICD-10-CM | POA: Diagnosis present

## 2013-02-18 DIAGNOSIS — Z79899 Other long term (current) drug therapy: Secondary | ICD-10-CM

## 2013-02-18 DIAGNOSIS — Z888 Allergy status to other drugs, medicaments and biological substances status: Secondary | ICD-10-CM

## 2013-02-18 DIAGNOSIS — E785 Hyperlipidemia, unspecified: Secondary | ICD-10-CM | POA: Diagnosis present

## 2013-02-18 DIAGNOSIS — J4489 Other specified chronic obstructive pulmonary disease: Secondary | ICD-10-CM | POA: Diagnosis present

## 2013-02-18 DIAGNOSIS — G43909 Migraine, unspecified, not intractable, without status migrainosus: Secondary | ICD-10-CM | POA: Diagnosis present

## 2013-02-18 DIAGNOSIS — Z91199 Patient's noncompliance with other medical treatment and regimen due to unspecified reason: Secondary | ICD-10-CM

## 2013-02-18 LAB — BASIC METABOLIC PANEL
BUN: 50 mg/dL — ABNORMAL HIGH (ref 6–23)
Calcium: 10.1 mg/dL (ref 8.4–10.5)
Chloride: 87 mEq/L — ABNORMAL LOW (ref 96–112)
Creatinine, Ser: 8.97 mg/dL — ABNORMAL HIGH (ref 0.50–1.35)
GFR calc Af Amer: 8 mL/min — ABNORMAL LOW (ref >90)
GFR calc non Af Amer: 7 mL/min — ABNORMAL LOW (ref >90)

## 2013-02-18 LAB — CBC WITH DIFFERENTIAL/PLATELET
Basophils Absolute: 0 10*3/uL (ref 0.0–0.1)
Basophils Relative: 0 % (ref 0–1)
Eosinophils Relative: 2 % (ref 0–5)
HCT: 23.1 % — ABNORMAL LOW (ref 39.0–52.0)
Lymphocytes Relative: 17 % (ref 12–46)
MCHC: 34.6 g/dL (ref 30.0–36.0)
MCV: 92.4 fL (ref 78.0–100.0)
Monocytes Absolute: 0.4 10*3/uL (ref 0.1–1.0)
Platelets: 187 10*3/uL (ref 150–400)
RDW: 16.9 % — ABNORMAL HIGH (ref 11.5–15.5)
WBC: 5.4 10*3/uL (ref 4.0–10.5)

## 2013-02-18 LAB — MAGNESIUM: Magnesium: 1.9 mg/dL (ref 1.5–2.5)

## 2013-02-18 LAB — PRO B NATRIURETIC PEPTIDE: Pro B Natriuretic peptide (BNP): >70000 pg/mL — ABNORMAL HIGH (ref 0–125)

## 2013-02-18 LAB — TROPONIN I: Troponin I: <0.3 ng/mL (ref <0.30)

## 2013-02-18 MED ORDER — IPRATROPIUM BROMIDE 0.02 % IN SOLN
0.5000 mg | Freq: Once | RESPIRATORY_TRACT | Status: AC
  Administered 2013-02-18: 0.5 mg via RESPIRATORY_TRACT
  Filled 2013-02-18: qty 2.5

## 2013-02-18 MED ORDER — CARVEDILOL 12.5 MG PO TABS
12.5000 mg | ORAL_TABLET | Freq: Two times a day (BID) | ORAL | Status: DC
  Administered 2013-02-18 – 2013-02-24 (×12): 12.5 mg via ORAL
  Filled 2013-02-18 (×12): qty 1

## 2013-02-18 MED ORDER — DEXTROSE 5 % IV SOLN: 1.0000 g | Freq: Three times a day (TID) | INTRAVENOUS | Status: DC

## 2013-02-18 MED ORDER — LABETALOL HCL 200 MG PO TABS
400.0000 mg | ORAL_TABLET | Freq: Three times a day (TID) | ORAL | Status: DC
  Administered 2013-02-18 – 2013-02-24 (×16): 400 mg via ORAL
  Filled 2013-02-18 (×16): qty 2

## 2013-02-18 MED ORDER — DEXTROSE 5 % IV SOLN
2.0000 g | Freq: Once | INTRAVENOUS | Status: AC
  Administered 2013-02-18 – 2013-02-19 (×2): 2 g via INTRAVENOUS
  Filled 2013-02-18: qty 2

## 2013-02-18 MED ORDER — OLANZAPINE 5 MG PO TABS
10.0000 mg | ORAL_TABLET | Freq: Two times a day (BID) | ORAL | Status: DC
  Administered 2013-02-18 – 2013-02-24 (×12): 10 mg via ORAL
  Filled 2013-02-18 (×12): qty 2

## 2013-02-18 MED ORDER — ENOXAPARIN SODIUM 30 MG/0.3ML ~~LOC~~ SOLN
30.0000 mg | SUBCUTANEOUS | Status: DC
  Administered 2013-02-18 – 2013-02-23 (×6): 30 mg via SUBCUTANEOUS
  Filled 2013-02-18 (×6): qty 0.3

## 2013-02-18 MED ORDER — HYDRALAZINE HCL 25 MG PO TABS
50.0000 mg | ORAL_TABLET | Freq: Three times a day (TID) | ORAL | Status: DC
  Administered 2013-02-18 – 2013-02-24 (×16): 50 mg via ORAL
  Filled 2013-02-18: qty 1
  Filled 2013-02-18 (×15): qty 2

## 2013-02-18 MED ORDER — LEVOFLOXACIN IN D5W 500 MG/100ML IV SOLN
500.0000 mg | INTRAVENOUS | Status: DC
  Administered 2013-02-20 – 2013-02-22 (×2): 500 mg via INTRAVENOUS
  Filled 2013-02-18 (×3): qty 100

## 2013-02-18 MED ORDER — LISINOPRIL 10 MG PO TABS
20.0000 mg | ORAL_TABLET | Freq: Two times a day (BID) | ORAL | Status: DC
  Administered 2013-02-18 – 2013-02-24 (×13): 20 mg via ORAL
  Filled 2013-02-18 (×12): qty 2

## 2013-02-18 MED ORDER — ALBUTEROL SULFATE (5 MG/ML) 0.5% IN NEBU
2.5000 mg | INHALATION_SOLUTION | Freq: Once | RESPIRATORY_TRACT | Status: AC
  Administered 2013-02-18: 2.5 mg via RESPIRATORY_TRACT
  Filled 2013-02-18 (×2): qty 0.5

## 2013-02-18 MED ORDER — SEVELAMER CARBONATE 800 MG PO TABS: 2400.0000 mg | ORAL_TABLET | Freq: Three times a day (TID) | ORAL | Status: DC

## 2013-02-18 MED ORDER — IPRATROPIUM BROMIDE 0.02 % IN SOLN: 0.5000 mg | Freq: Once | RESPIRATORY_TRACT | Status: DC

## 2013-02-18 MED ORDER — LEVOFLOXACIN IN D5W 750 MG/150ML IV SOLN
750.0000 mg | INTRAVENOUS | Status: DC
  Filled 2013-02-18: qty 150

## 2013-02-18 MED ORDER — CLONIDINE HCL 0.2 MG PO TABS
0.2000 mg | ORAL_TABLET | Freq: Two times a day (BID) | ORAL | Status: DC
  Administered 2013-02-18 – 2013-02-24 (×13): 0.2 mg via ORAL
  Filled 2013-02-18 (×13): qty 1

## 2013-02-18 MED ORDER — IPRATROPIUM BROMIDE 0.02 % IN SOLN
0.5000 mg | RESPIRATORY_TRACT | Status: DC
  Administered 2013-02-19 – 2013-02-20 (×10): 0.5 mg via RESPIRATORY_TRACT
  Filled 2013-02-18 (×10): qty 2.5

## 2013-02-18 MED ORDER — PANTOPRAZOLE SODIUM 40 MG PO TBEC
40.0000 mg | DELAYED_RELEASE_TABLET | Freq: Every day | ORAL | Status: DC
  Administered 2013-02-18 – 2013-02-24 (×7): 40 mg via ORAL
  Filled 2013-02-18 (×7): qty 1

## 2013-02-18 MED ORDER — VANCOMYCIN HCL 10 G IV SOLR
1500.0000 mg | Freq: Once | INTRAVENOUS | Status: AC
  Administered 2013-02-18: 1500 mg via INTRAVENOUS
  Filled 2013-02-18: qty 1500

## 2013-02-18 MED ORDER — TOPIRAMATE 25 MG PO TABS
50.0000 mg | ORAL_TABLET | Freq: Every day | ORAL | Status: DC
  Administered 2013-02-18 – 2013-02-23 (×6): 50 mg via ORAL
  Filled 2013-02-18 (×7): qty 2

## 2013-02-18 MED ORDER — ALBUTEROL SULFATE (5 MG/ML) 0.5% IN NEBU: 2.5000 mg | INHALATION_SOLUTION | RESPIRATORY_TRACT | Status: DC

## 2013-02-18 MED ORDER — AMLODIPINE BESYLATE 5 MG PO TABS
10.0000 mg | ORAL_TABLET | Freq: Every day | ORAL | Status: DC
  Administered 2013-02-18 – 2013-02-24 (×7): 10 mg via ORAL
  Filled 2013-02-18 (×7): qty 2

## 2013-02-18 MED ORDER — DULOXETINE HCL 60 MG PO CPEP
60.0000 mg | ORAL_CAPSULE | Freq: Every day | ORAL | Status: DC
  Administered 2013-02-19 – 2013-02-24 (×6): 60 mg via ORAL
  Filled 2013-02-18 (×6): qty 1

## 2013-02-18 MED ORDER — HYDROXYZINE HCL 25 MG PO TABS
25.0000 mg | ORAL_TABLET | Freq: Two times a day (BID) | ORAL | Status: DC
  Administered 2013-02-18 – 2013-02-24 (×12): 25 mg via ORAL
  Filled 2013-02-18 (×12): qty 1

## 2013-02-18 MED ORDER — OXYCODONE-ACETAMINOPHEN 5-325 MG PO TABS
1.0000 | ORAL_TABLET | ORAL | Status: DC | PRN
  Administered 2013-02-18 – 2013-02-24 (×32): 1 via ORAL
  Filled 2013-02-18 (×33): qty 1

## 2013-02-18 MED ORDER — IPRATROPIUM BROMIDE 0.02 % IN SOLN
0.5000 mg | RESPIRATORY_TRACT | Status: DC
  Administered 2013-02-18: 0.5 mg via RESPIRATORY_TRACT
  Filled 2013-02-18: qty 2.5

## 2013-02-18 MED ORDER — IPRATROPIUM-ALBUTEROL 0.5-2.5 (3) MG/3ML IN SOLN: 3.0000 mL | RESPIRATORY_TRACT | Status: DC

## 2013-02-18 MED ORDER — ALBUTEROL SULFATE (5 MG/ML) 0.5% IN NEBU
2.5000 mg | INHALATION_SOLUTION | RESPIRATORY_TRACT | Status: DC
  Administered 2013-02-18: 2.5 mg via RESPIRATORY_TRACT
  Filled 2013-02-18: qty 0.5

## 2013-02-18 MED ORDER — IPRATROPIUM BROMIDE 0.02 % IN SOLN: 0.5000 mg | RESPIRATORY_TRACT | Status: DC

## 2013-02-18 MED ORDER — SIMVASTATIN 20 MG PO TABS
20.0000 mg | ORAL_TABLET | Freq: Every day | ORAL | Status: DC
  Administered 2013-02-19 – 2013-02-23 (×5): 20 mg via ORAL
  Filled 2013-02-18 (×5): qty 1

## 2013-02-18 MED ORDER — ALBUTEROL SULFATE (5 MG/ML) 0.5% IN NEBU
2.5000 mg | INHALATION_SOLUTION | RESPIRATORY_TRACT | Status: DC
  Administered 2013-02-19 – 2013-02-20 (×10): 2.5 mg via RESPIRATORY_TRACT
  Filled 2013-02-18 (×10): qty 0.5

## 2013-02-18 MED ORDER — ZOLPIDEM TARTRATE 5 MG PO TABS
10.0000 mg | ORAL_TABLET | Freq: Every evening | ORAL | Status: DC | PRN
  Administered 2013-02-18 – 2013-02-23 (×6): 10 mg via ORAL
  Filled 2013-02-18 (×6): qty 2
  Filled 2013-02-18: qty 1

## 2013-02-18 MED ORDER — ROPINIROLE HCL 1 MG PO TABS
1.0000 mg | ORAL_TABLET | Freq: Every day | ORAL | Status: DC
  Administered 2013-02-18 – 2013-02-23 (×6): 1 mg via ORAL
  Filled 2013-02-18 (×7): qty 1

## 2013-02-18 MED ORDER — LEVOFLOXACIN IN D5W 750 MG/150ML IV SOLN
750.0000 mg | Freq: Once | INTRAVENOUS | Status: AC
  Administered 2013-02-18: 750 mg via INTRAVENOUS

## 2013-02-18 MED ORDER — LABETALOL HCL 300 MG PO TABS: 800.0000 mg | ORAL_TABLET | Freq: Two times a day (BID) | ORAL | Status: DC

## 2013-02-18 MED ORDER — ASPIRIN EC 81 MG PO TBEC
81.0000 mg | DELAYED_RELEASE_TABLET | Freq: Every day | ORAL | Status: DC
  Administered 2013-02-19 – 2013-02-24 (×6): 81 mg via ORAL
  Filled 2013-02-18 (×6): qty 1

## 2013-02-18 MED ORDER — OXYCODONE-ACETAMINOPHEN 5-325 MG PO TABS
1.0000 | ORAL_TABLET | Freq: Once | ORAL | Status: AC
  Administered 2013-02-18: 1 via ORAL
  Filled 2013-02-18: qty 1

## 2013-02-18 NOTE — ED Notes (Signed)
Pt reports that he has been sick for last 2 weeks, was seen by pmd and started on zpack but hs not gotten any better. Coughing up yellow mucus, fever at times. +sob, has home 02 to wear but is not present at arrival today. Due for dialysis in the am. Pt's llegs are swollen from feet to upper thighs. Also complaining of migraine.
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
## 2013-02-18 NOTE — ED Notes (Addendum)
Pt reports difficulty breathing. Pt reports a productive cough. He reports he has been using his oxygen but arrives today without his oxygen. Reports he is usually on 4 l/min. Current O2 sats is 96. Denies any chest pain. Pt is scheduled for dialysis tomorrow.
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
## 2013-02-18 NOTE — H&P (Signed)
NAME:  MIA, WINTHROP NO.:  [id number]  MEDICAL RECORD NO.:  [id number]  LOCATION:  A339                          FACILITY:  [facility]  PHYSICIAN:  Melvyn Novas, MDDATE OF BIRTH:  1976-11-18  DATE OF ADMISSION:  02/18/2013 DATE OF DISCHARGE:  LH                             HISTORY & PHYSICAL   HISTORY OF PRESENT ILLNESS:  The patient is a 37 year old white male with extensive medical history including end-stage renal disease, on dialysis, two-vessel coronary disease with ejection fraction 40-45%, hyperlipidemia, severe COPD, continued smoking, who presents with a several day history of cough, continuous grayish sputum, was seen and evaluated in the ER, found to have a right middle lobe and lower lobe opacification infiltrate and subsequently will be admitted for healthcare-associated pneumonia since he is on dialysis with a regimen of vancomycin, Maxipime, and Levaquin adjusted for his renal insufficiency.  The patient denies anginal chest pain.  Denies hemoptysis.  Denies rigors, chills, fever, or syncope.  PAST MEDICAL HISTORY:  Significant for the aforementioned hypertension, hyperlipidemia, coronary artery disease, COPD, schizophrenia, bipolar disorder, chronic noncompliance in the past but more compliant recently, congestive heart failure, ischemic cardiomyopathy, restless legs syndrome, chronic insomnia, end-stage renal disease, and hyperlipidemia.  PAST SURGICAL HISTORY:  Remarkable for appendectomy, tonsillectomy.  ALLERGIES:  He has no known allergies.  CURRENT MEDICINES: 1. Norvasc 10 mg per day. 2. Aspirin 81 mg per day. 3. Carvedilol 12.5 p.o. b.i.d. 4. Clonidine 0.2 mg p.o. b.i.d. 5. Cymbalta 60 mg p.o. daily. 6. Hydralazine 50 mg p.o. t.i.d. 7. Vistaril 25 mg 2 p.o. at bedtime. 8. Normodyne 800 mg p.o. t.i.d. 9. Lisinopril 20 mg p.o. daily. 10.Zyprexa 10 mg p.o. daily. 11.Oxycodone 5 mg p.o. t.i.d. p.r.n. 12.Protonix 40 mg p.o.  daily. 13.Requip 1 mg p.o. at bedtime. 14.Renvela 2400-4000 mg daily. 15.Zocor 20 mg per day. 16.Topamax 50 mg p.o. at bedtime. 17.Ambien 10 mg p.o. at bedtime.  REVIEW OF SYSTEMS:  Negative for seizures, tremors, polyuria, polydipsia, weight loss.  PHYSICAL EXAMINATION:  VITAL SIGNS:  Blood pressure is 138/82, temperature 98.6, respiratory rate is 20, and pulse is 96 and regular. EYES:  PERRLA intact.  Sclerae clear.  Conjunctivae pink. NECK:  No JVD.  No carotid bruits.  No thyromegaly.  No thyroid bruits. LUNGS:  Coarse rhonchi bilaterally, prolonged expiratory phase.  Mild end-expiratory wheeze.  Diminished breath sounds at right base.  No rales audible. HEART:  Regular rhythm.  No S3-S4.  No heaves, thrills, or rubs. ABDOMEN:  Soft, nontender.  Bowel sounds normoactive.  No guarding, rebound, mass, or megaly. EXTREMITIES:  Trace to 1+ chronic pedal edema.  Cranial nerves grossly intact, 3 through 12.  Patient moves all 4 extremities.  IMPRESSION: 1. Right middle lobe and lower lobe infiltrate. 2. Chronic obstructive pulmonary disease with continued smoking. 3. End-stage renal disease. 4. Hypertension. 5. Ischemic cardiomyopathy.  Ejection fraction 45%. 6. Schizophrenia. 7. Bipolar disorder. 8. Insomnia. 9. Chronic noncompliance. 10.Severe hypertension. 11.Hyperlipidemia.  PLAN:  To admit and continue on cefepime, Levaquin, and vancomycin for healthcare-associated pneumonia.  We will make further recommendations as the database expands.     Melvyn Novas, MD     RMD/MEDQ  D:  02/18/2013  T:  02/18/2013  Job:  [id number]

## 2013-02-19 LAB — HEPATIC FUNCTION PANEL
ALT: 10 U/L (ref 0–53)
AST: 19 U/L (ref 0–37)
Albumin: 3 g/dL — ABNORMAL LOW (ref 3.5–5.2)
Alkaline Phosphatase: 216 U/L — ABNORMAL HIGH (ref 39–117)
Total Bilirubin: 0.6 mg/dL (ref 0.3–1.2)

## 2013-02-19 LAB — VITAMIN B12: Vitamin B-12: 1199 pg/mL — ABNORMAL HIGH (ref 211–911)

## 2013-02-19 LAB — IRON AND TIBC
Iron: 65 ug/dL (ref 42–135)
Saturation Ratios: 31 % (ref 20–55)
TIBC: 208 ug/dL — ABNORMAL LOW (ref 215–435)

## 2013-02-19 LAB — BASIC METABOLIC PANEL
BUN: 55 mg/dL — ABNORMAL HIGH (ref 6–23)
CO2: 19 mEq/L (ref 19–32)
Calcium: 9.9 mg/dL (ref 8.4–10.5)
GFR calc non Af Amer: 6 mL/min — ABNORMAL LOW (ref >90)
Glucose, Bld: 92 mg/dL (ref 70–99)

## 2013-02-19 LAB — FERRITIN: Ferritin: 3673 ng/mL — ABNORMAL HIGH (ref 22–322)

## 2013-02-19 MED ORDER — LIDOCAINE-PRILOCAINE 2.5-2.5 % EX CREA: 1.0000 "application " | TOPICAL_CREAM | CUTANEOUS | Status: DC | PRN

## 2013-02-19 MED ORDER — HEPARIN SODIUM (PORCINE) 1000 UNIT/ML DIALYSIS
300.0000 [IU] | INTRAMUSCULAR | Status: DC | PRN
  Administered 2013-02-19 – 2013-02-21 (×2): 300 [IU] via INTRAVENOUS_CENTRAL
  Filled 2013-02-19: qty 1

## 2013-02-19 MED ORDER — VANCOMYCIN HCL IN DEXTROSE 1-5 GM/200ML-% IV SOLN
1000.0000 mg | INTRAVENOUS | Status: DC
  Administered 2013-02-19 – 2013-02-23 (×3): 1000 mg via INTRAVENOUS
  Filled 2013-02-19 (×3): qty 200

## 2013-02-19 MED ORDER — SODIUM CHLORIDE 0.9 % IV SOLN: 100.0000 mL | INTRAVENOUS | Status: DC | PRN

## 2013-02-19 MED ORDER — SEVELAMER CARBONATE 800 MG PO TABS
2400.0000 mg | ORAL_TABLET | Freq: Two times a day (BID) | ORAL | Status: DC | PRN
  Filled 2013-02-19: qty 3

## 2013-02-19 MED ORDER — HEPARIN SODIUM (PORCINE) 1000 UNIT/ML DIALYSIS
1000.0000 [IU] | INTRAMUSCULAR | Status: DC | PRN
  Filled 2013-02-19: qty 1

## 2013-02-19 MED ORDER — EPOETIN ALFA 10000 UNIT/ML IJ SOLN
10000.0000 [IU] | INTRAMUSCULAR | Status: DC
  Administered 2013-02-19 – 2013-02-23 (×3): 10000 [IU] via INTRAVENOUS
  Filled 2013-02-19 (×6): qty 1

## 2013-02-19 MED ORDER — LIDOCAINE HCL (PF) 1 % IJ SOLN
5.0000 mL | INTRAMUSCULAR | Status: DC | PRN
  Filled 2013-02-19: qty 5

## 2013-02-19 MED ORDER — NEPRO/CARBSTEADY PO LIQD
237.0000 mL | ORAL | Status: DC | PRN
  Filled 2013-02-19: qty 237

## 2013-02-19 MED ORDER — ALTEPLASE 2 MG IJ SOLR
2.0000 mg | Freq: Once | INTRAMUSCULAR | Status: AC | PRN
  Filled 2013-02-19: qty 2

## 2013-02-19 MED ORDER — SEVELAMER CARBONATE 800 MG PO TABS
4000.0000 mg | ORAL_TABLET | Freq: Three times a day (TID) | ORAL | Status: DC
  Administered 2013-02-19 – 2013-02-24 (×12): 4000 mg via ORAL
  Filled 2013-02-19 (×12): qty 5

## 2013-02-19 MED ORDER — DEXTROSE 5 % IV SOLN
2.0000 g | INTRAVENOUS | Status: DC
  Administered 2013-02-21 – 2013-02-23 (×2): 2 g via INTRAVENOUS
  Filled 2013-02-19 (×3): qty 2

## 2013-02-19 MED ORDER — PENTAFLUOROPROP-TETRAFLUOROETH EX AERO
1.0000 "application " | INHALATION_SPRAY | CUTANEOUS | Status: DC | PRN
  Filled 2013-02-19: qty 103.5
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
## 2013-02-19 NOTE — Progress Notes (Signed)
[id number] 

## 2013-02-19 NOTE — Procedures (Signed)
4 hour hemodialysis treatment completed through left upper arm AVF (15g needles ante/retrograde).  GOAL MET: 3.7L removed with no interruption in ultrafiltration.  All blood was reinfused.  Hemostasis achieved within 14 minutes.  Report given to Deberah Castle, RN

## 2013-02-19 NOTE — Progress Notes (Signed)
UR Chart Review Completed  

## 2013-02-20 LAB — BASIC METABOLIC PANEL
BUN: 28 mg/dL — ABNORMAL HIGH (ref 6–23)
Chloride: 96 mEq/L (ref 96–112)
Creatinine, Ser: 5.97 mg/dL — ABNORMAL HIGH (ref 0.50–1.35)
GFR calc Af Amer: 13 mL/min — ABNORMAL LOW (ref >90)
GFR calc non Af Amer: 11 mL/min — ABNORMAL LOW (ref >90)
Potassium: 3.7 mEq/L (ref 3.5–5.1)

## 2013-02-20 LAB — CBC
HCT: 22.7 % — ABNORMAL LOW (ref 39.0–52.0)
Hemoglobin: 7.6 g/dL — ABNORMAL LOW (ref 13.0–17.0)
MCHC: 33.5 g/dL (ref 30.0–36.0)
RDW: 17.4 % — ABNORMAL HIGH (ref 11.5–15.5)
WBC: 6.7 10*3/uL (ref 4.0–10.5)

## 2013-02-20 LAB — HEPATIC FUNCTION PANEL
AST: 24 U/L (ref 0–37)
Bilirubin, Direct: 0.3 mg/dL (ref 0.0–0.3)
Indirect Bilirubin: 0.2 mg/dL — ABNORMAL LOW (ref 0.3–0.9)
Total Bilirubin: 0.5 mg/dL (ref 0.3–1.2)

## 2013-02-20 LAB — HEPATITIS B SURFACE ANTIGEN: Hepatitis B Surface Ag: NEGATIVE

## 2013-02-20 LAB — PHOSPHORUS: Phosphorus: 3.8 mg/dL (ref 2.3–4.6)

## 2013-02-20 MED ORDER — ROPINIROLE HCL 1 MG PO TABS
1.0000 mg | ORAL_TABLET | Freq: Once | ORAL | Status: AC
  Administered 2013-02-20: 1 mg via ORAL
  Filled 2013-02-20: qty 1

## 2013-02-20 MED ORDER — TOPIRAMATE 25 MG PO TABS
ORAL_TABLET | ORAL | Status: AC
  Filled 2013-02-20: qty 2

## 2013-02-20 MED ORDER — ROPINIROLE HCL 1 MG PO TABS
ORAL_TABLET | ORAL | Status: AC
  Filled 2013-02-20: qty 1

## 2013-02-20 NOTE — Progress Notes (Signed)
NAME:  [name], [name] NO.:  [id number]  MEDICAL RECORD NO.:  [id number]  LOCATION:  A339                          FACILITY:  [facility]  PHYSICIAN:  Melvyn Novas, MDDATE OF BIRTH:  [birth year]  DATE OF PROCEDURE: DATE OF DISCHARGE:                                PROGRESS NOTE   HISTORY OF PRESENT ILLNESS:  The patient is a 37 year old white male with legion medical problems, end-stage renal disease, ischemic cardiomyopathy, two-vessel disease, EF 45%, anemia of chronic disease secondary to renal failure, chronic noncompliance, COPD, continued smoking, accelerated hypertension due to partial noncompliance, hyperlipidemia, congestive heart failure multifactorial with right middle lobe and right lower lobe infiltrate, which are new for him, currently controlled, started on 3-drug regimen for hospital-associated pneumonia as he is a dialysis patient, currently on Maxipime, Levaquin, and vancomycin for approximately 24 hours at present.  Pulmonary, Dr. Juanetta Gosling, is on vacation this week; consultation was requested in addition to Nephrology.  We will continue course as is.  PHYSICAL EXAMINATION:  VITAL SIGNS:  Blood pressure hemodynamically somewhat better today, currently undergoing dialysis.  Prior to dialysis, it was 190/97.  Temperature 97.9, pulse 83 and regular, respiratory rate is 20.  Creatinine 9.5, potassium normal. LUNGS:  Coarse rhonchi bilaterally, mild end-expiratory wheeze, right lower lobe crepitations, prolonged inspiratory and expiratory phase. HEART:  Regular rhythm.  No S3, S4.  No heaves, thrills, rubs. EXTREMITIES:  1+ chronic pitting edema.  PLAN:  The plan right now is to continue 3 drugs for hospital-associated pneumonia, better control hemodynamics with ultrafiltration of negative fluid balance as well as antihypertensives.  Monitor renal function and dialysis today, probably tomorrow also depending on Nephrology's perspective,  nebulizer therapy and incentive spirometry.  The patient understands that he needs to be in hospital and accepts this.  We will continue Epogen infusions with dialysis for his anemia of chronic disease.     Melvyn Novas, MD     RMD/MEDQ  D:  02/19/2013  T:  02/20/2013  Job:  [id number]

## 2013-02-20 NOTE — Care Management Note (Unsigned)
    Page 1 of 1   02/20/2013     1:56:58 PM   CARE MANAGEMENT NOTE 02/20/2013  Patient:  [name], [name]   Account Number:  [id number]  Date Initiated:  02/20/2013  Documentation initiated by:  Sharrie Rothman  Subjective/Objective Assessment:   Pt admitted from home with pneumonia. Pt lives with his father and will return home at discharge. Pt is indpendent with ADL's. Pt receives dialysis T-TH-Sat at [facility] in [place].     Action/Plan:   No CM needs noted.   Anticipated DC Date:  02/22/2013   Anticipated DC Plan:  HOME/SELF CARE      DC Planning Services  CM consult      Choice offered to / List presented to:             Status of service:  Completed, signed off Medicare Important Message given?   (If response is "NO", the following Medicare IM given date fields will be blank) Date Medicare IM given:   Date Additional Medicare IM given:    Discharge Disposition:    Per UR Regulation:    If discussed at Long Length of Stay Meetings, dates discussed:    Comments:  02/20/13 1355 Arlyss Queen, RN BSN CM

## 2013-02-20 NOTE — Progress Notes (Signed)
[id number] 

## 2013-02-20 NOTE — Progress Notes (Signed)
[name]  MRN: [id number]  DOB/AGE: January 14, 1976 37 y.o.  Primary Care Physician:DONDIEGO,RICHARD M, MD  Admit date: 02/18/2013  Chief Complaint:  Chief Complaint  Patient presents with  . Shortness of Breath    S-Pt presented on  02/18/2013 with  Chief Complaint  Patient presents with  . Shortness of Breath  .    Pt today feels better    Pt walking around and says he is waiting to go home.  Marland Kitchen ipratropium  0.5 mg Nebulization Q4H WA   And  . albuterol  2.5 mg Nebulization Q4H WA  . amLODipine  10 mg Oral Daily  . aspirin EC  81 mg Oral Daily  . carvedilol  12.5 mg Oral BID WC  . ceFEPime (MAXIPIME) IV  2 g Intravenous Q T,Th,Sa-HD  . cloNIDine  0.2 mg Oral BID  . DULoxetine  60 mg Oral Daily  . enoxaparin (LOVENOX) injection  30 mg Subcutaneous Q24H  . epoetin alfa  10,000 Units Intravenous 3 times weekly  . hydrALAZINE  50 mg Oral TID  . hydrOXYzine  25 mg Oral BID  . labetalol  400 mg Oral TID  . levofloxacin (LEVAQUIN) IV  500 mg Intravenous Q48H  . lisinopril  20 mg Oral BID  . OLANZapine  10 mg Oral BID  . pantoprazole  40 mg Oral Daily  . rOPINIRole  1 mg Oral QHS  . sevelamer carbonate  4,000 mg Oral TID WC  . simvastatin  20 mg Oral q1800  . topiramate  50 mg Oral QHS  . vancomycin  1,000 mg Intravenous Q T,Th,Sa-HD         Physical Exam: Vital signs in last 24 hours: Temp:  [97.4 F (36.3 C)-97.9 F (36.6 C)] 97.6 F (36.4 C) (03/05 0547) Pulse Rate:  [71-86] 77 (03/05 0547) Resp:  [20] 20 (03/05 0547) BP: (134-160)/(67-95) 157/92 mmHg (03/05 0900) SpO2:  [91 %-99 %] 98 % (03/05 0547) Weight change:  Last BM Date: 02/19/13  Intake/Output from previous day: 03/04 0701 - 03/05 0700 In: -  Out: 3700      Physical Exam: General- pt is awake,alert, oriented to time place and person Resp- No acute REsp distress, CTA B/L NO Rhonchi CVS- S1S2 regular in rate and rhythm GIT- BS+, soft, NT, ND EXT- NO LE Edema, Cyanosis Access- left AVf +  thrill  Lab Results: CBC  Recent Labs  02/18/13 1402 02/20/13 0504  WBC 5.4 6.7  HGB 8.0* 7.6*  HCT 23.1* 22.7*  PLT 187 207    BMET  Recent Labs  02/19/13 0505 02/20/13 0504  NA 130* 137  K 4.9 3.7  CL 89* 96  CO2 19 26  GLUCOSE 92 105*  BUN 55* 28*  CREATININE 9.55* 5.97*  CALCIUM 9.9 9.5    MICRO Recent Results (from the past 240 hour(s))  CULTURE, BLOOD (ROUTINE X 2)     Status: None   Collection Time    02/18/13  3:59 PM      Result Value Range Status   Specimen Description BLOOD RIGHT HAND   Final   Special Requests BOTTLES DRAWN AEROBIC AND ANAEROBIC 6CC EACH   Final   Culture NO GROWTH 2 DAYS   Final   Report Status PENDING   Incomplete  CULTURE, BLOOD (ROUTINE X 2)     Status: None   Collection Time    02/18/13  4:08 PM      Result Value Range Status   Specimen Description BLOOD  RIGHT FOREARM   Final   Special Requests BOTTLES DRAWN AEROBIC AND ANAEROBIC 6CC EACH   Final   Culture NO GROWTH 2 DAYS   Final   Report Status PENDING   Incomplete      Lab Results  Component Value Date   CALCIUM 9.5 02/20/2013   CAION 1.26* 06/27/2012   PHOS 3.8 02/20/2013        Impression: 1)Renal  ESRD on HD ON TTS schedule Hx of NON Compliance Was last Dialyzed yesterday NO need of Hd today   2)HTN Target Organ damage  CKD  Medication- On RAS blockers-  On Calcium Channel Blockers On Alpha and beta Blockers  On Vasodilators ON Central [employer]patholytics  3)Anemia HGb at goal (9--11) On EPO  4)CKD Mineral-Bone Disorder PTH not avail Secondary Hyperparathyroidism w/u pending  Phosphorus at goal.   5)ID- Admitted with Cough-Possible Pneumonia  6)FEN  Normokalemic Hyponatremic-now beter    7)Acid base Co2 at goal     Plan:  NO need for HD today Will follow  If d/ced will follow at outpt HD center      [facility] S 02/20/2013, 11:08 AM

## 2013-02-21 ENCOUNTER — Inpatient Hospital Stay (HOSPITAL_COMMUNITY): Payer: Medicare Other

## 2013-02-21 LAB — BASIC METABOLIC PANEL
CO2: 26 mEq/L (ref 19–32)
Chloride: 95 mEq/L — ABNORMAL LOW (ref 96–112)
Glucose, Bld: 102 mg/dL — ABNORMAL HIGH (ref 70–99)
Potassium: 3.5 mEq/L (ref 3.5–5.1)
Sodium: 137 mEq/L (ref 135–145)

## 2013-02-21 LAB — HEPATIC FUNCTION PANEL
Albumin: 3.1 g/dL — ABNORMAL LOW (ref 3.5–5.2)
Indirect Bilirubin: 0.2 mg/dL — ABNORMAL LOW (ref 0.3–0.9)
Total Protein: 6.4 g/dL (ref 6.0–8.3)

## 2013-02-21 IMAGING — CR DG ABDOMEN ACUTE W/ 1V CHEST
3 series · 3 of 3 positions shown · non-contrast
Comparison: Chest radiograph 08/05/2011

CLINICAL DATA: Abdominal pain.  Dialysis patient.

ACUTE ABDOMEN SERIES (ABDOMEN 2 VIEW & CHEST 1 VIEW)

[view not recorded (1 of 3)]
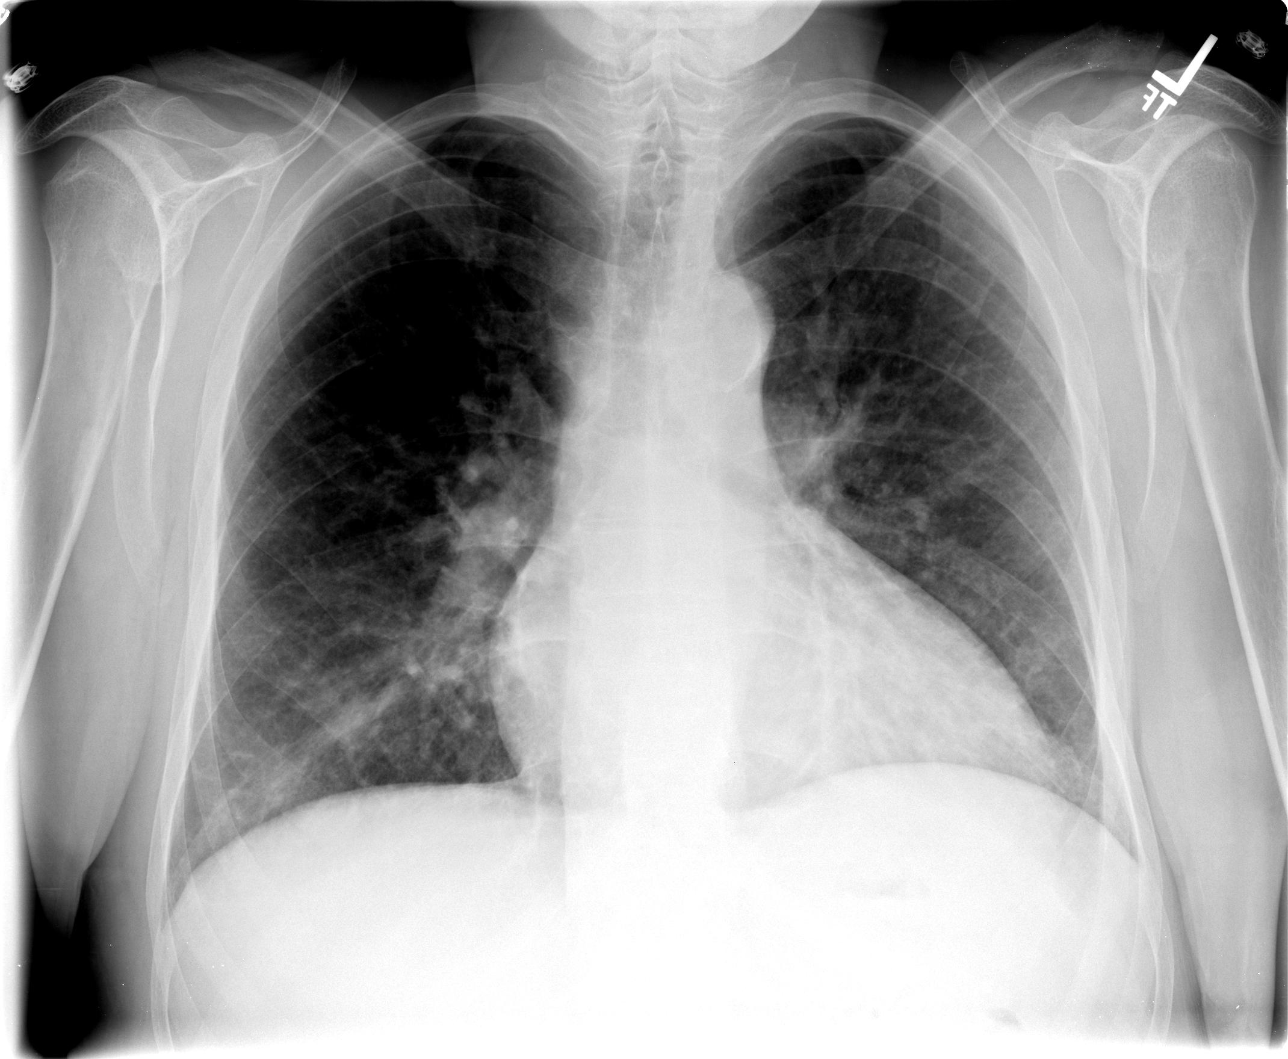

[view not recorded (2 of 3)]
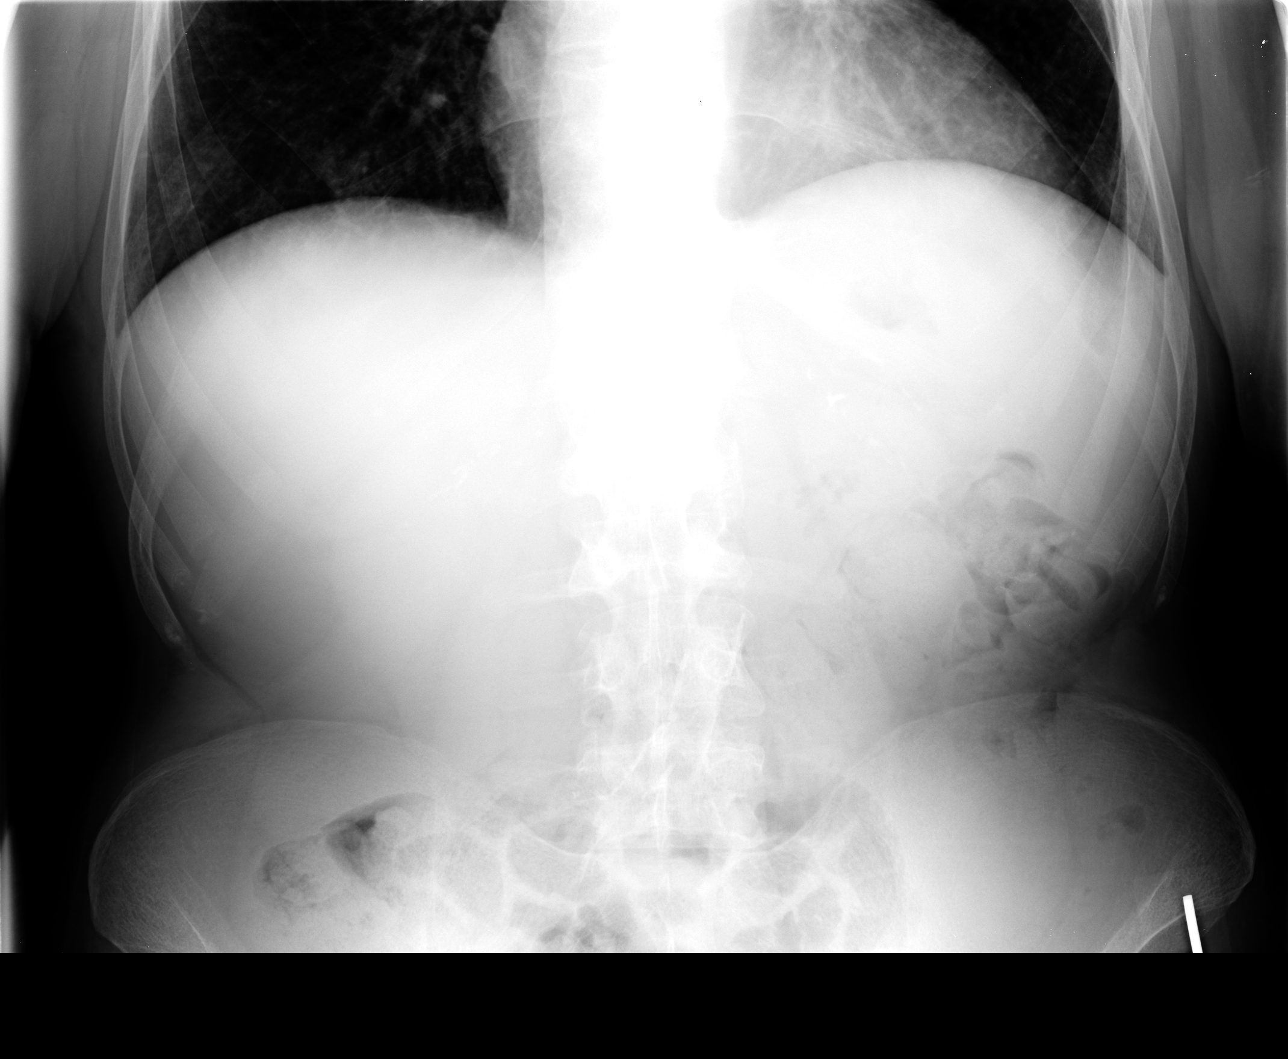

[view not recorded (3 of 3)]
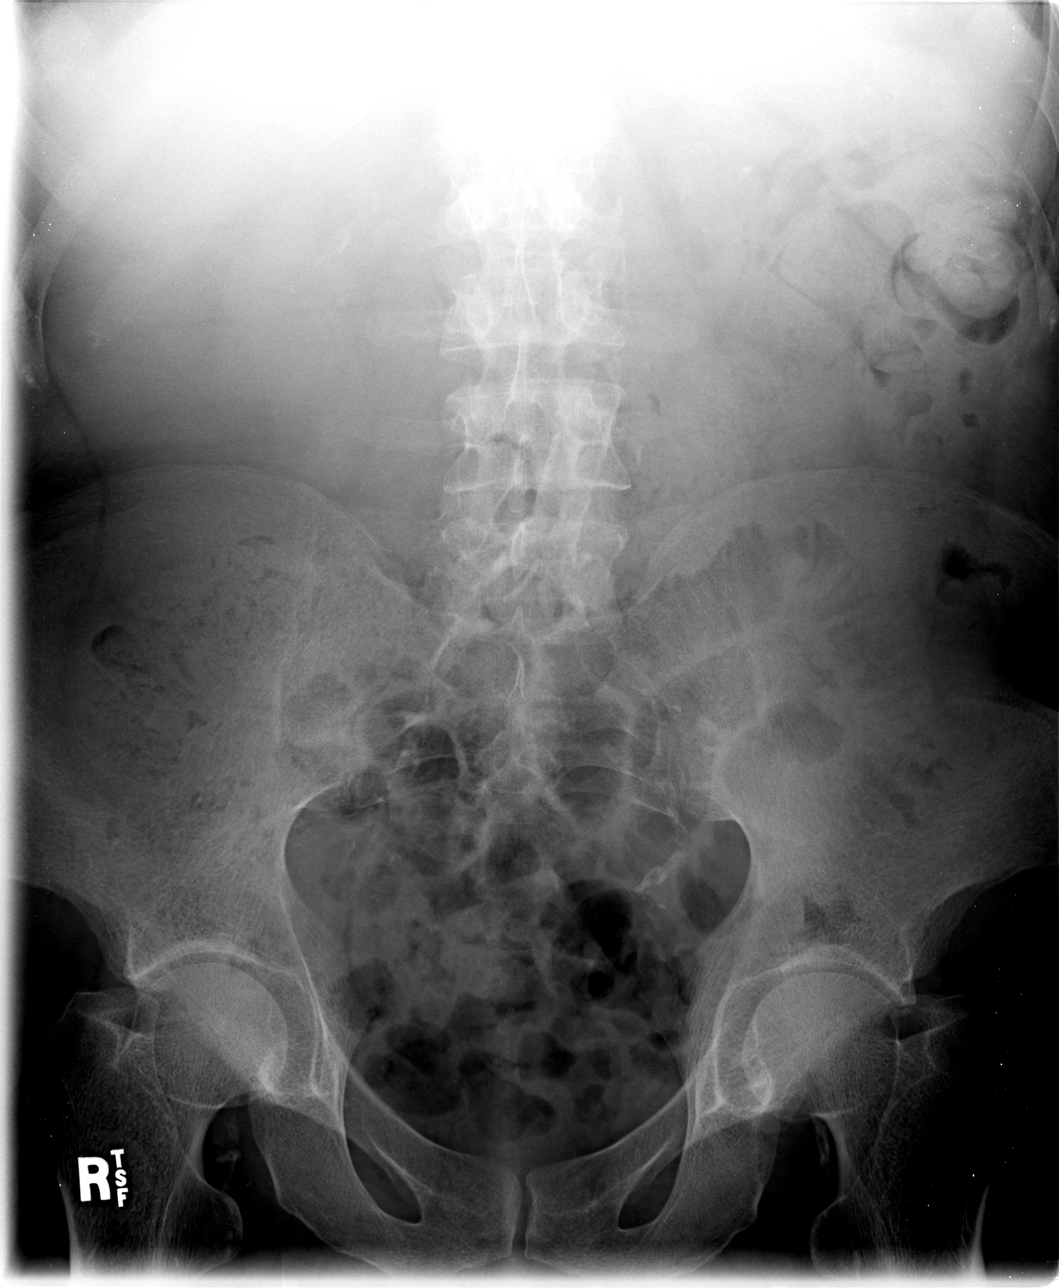

[3 of 3 positions shown; findings below may reference images not displayed]

FINDINGS: The airspace densities in the right lower lobe and right
hilum have decreased.  Findings may represent decreasing pulmonary
edema.  Heart size is upper limits of normal.  Trachea is midline.
No evidence for free air.  There are dilated loops of small bowel
in the left upper pelvic.  Bowel loops in the lower pelvis appear
to be more decompressed.  There is stool and gas in the splenic
flexure of the colon.
IMPRESSION: Dilated loops of small bowel in the upper pelvis. Findings could
represent an early or partial small bowel obstruction.

Improving aeration at the right lung base and right hilum.
Findings could represent decreasing edema or airspace disease.

## 2013-02-21 MED ORDER — IPRATROPIUM BROMIDE 0.02 % IN SOLN: 0.5000 mg | RESPIRATORY_TRACT | Status: DC

## 2013-02-21 MED ORDER — IPRATROPIUM BROMIDE 0.02 % IN SOLN
0.5000 mg | Freq: Three times a day (TID) | RESPIRATORY_TRACT | Status: DC
  Administered 2013-02-21 – 2013-02-23 (×7): 0.5 mg via RESPIRATORY_TRACT
  Filled 2013-02-21 (×7): qty 2.5

## 2013-02-21 MED ORDER — SODIUM CHLORIDE 0.9 % IV SOLN: 100.0000 mL | INTRAVENOUS | Status: DC | PRN

## 2013-02-21 MED ORDER — ALBUTEROL SULFATE (5 MG/ML) 0.5% IN NEBU: 2.5000 mg | INHALATION_SOLUTION | Freq: Three times a day (TID) | RESPIRATORY_TRACT | Status: DC

## 2013-02-21 MED ORDER — ALTEPLASE 2 MG IJ SOLR
2.0000 mg | Freq: Once | INTRAMUSCULAR | Status: DC | PRN
  Filled 2013-02-21: qty 2

## 2013-02-21 MED ORDER — ALBUTEROL SULFATE (5 MG/ML) 0.5% IN NEBU
2.5000 mg | INHALATION_SOLUTION | Freq: Three times a day (TID) | RESPIRATORY_TRACT | Status: DC
  Administered 2013-02-21 – 2013-02-23 (×8): 2.5 mg via RESPIRATORY_TRACT
  Filled 2013-02-21 (×8): qty 0.5

## 2013-02-21 NOTE — Progress Notes (Signed)
NAMELUCY, WOOLEVER NO.:  [id number]  MEDICAL RECORD NO.:  [id number]  LOCATION:  A335                          FACILITY:  [facility]  PHYSICIAN:  Melvyn Novas, MDDATE OF BIRTH:  06/08/1976  DATE OF PROCEDURE:  02/20/2013 DATE OF DISCHARGE:                                PROGRESS NOTE   HISTORY:  The patient wanted to sign out AMA.  Today, I had a long talk with him and told him he needs intravenous antibiotics for perhaps up to 7 days and aggressive nebulizer therapy.  He seems to agree right now. Does not want a nicotine patch.  Currently, on Maxipime, vancomycin, and IV Levaquin.  PHYSICAL EXAMINATION:  VITAL SIGNS:  Blood pressure improved to 157/92, temperature 97.6, pulse 77 and regular, respiratory rate is 20. LUNGS:  Show prolonged expiratory phase.  Scattered rhonchi.  Mild end- expiratory wheeze. No rales audible. HEART:  Regular rhythm.  No S3, S4.  No heaves, thrills, or rubs. ABDOMEN:  Soft, nontender.  Bowel sounds normoactive. EXTREMITIES:  1+ pedal edema, chronic.  LABORATORY DATA:  Hemoglobin is 7.6, on Epogen.  Creatinine down to 5.9, potassium is 3.7.  IMPRESSION: 1. Right middle and lower lobe infiltrates. 2. Chronic obstructive pulmonary disease. 3. End-stage renal disease. 4. Two-vessel coronary artery disease, ischemic cardiomyopathy. 5. Hyperlipidemia. 6. Hypertension. 7. Bipolar disorder. 8. Schizoaffective disorder. 9. Chronic noncompliance.  PLAN:  The plan right now is to continue all antibiotics, pulmonary toilet, and follow up in a.m.     Melvyn Novas, MD    RMD/MEDQ  D:  02/20/2013  T:  02/20/2013  Job:  [id number]

## 2013-02-21 NOTE — Procedures (Signed)
4 hour hemodialysis treatment completed through left upper arm AVF (15g needles antegrade placement).  GOAL MET: tolerated removal of 4 liters with no interruption in ultrafiltration.  All blood was reinfused.  Hemostasis achieved within 12 minutes.  Report given to Deberah Castle, RN

## 2013-02-21 NOTE — Progress Notes (Signed)
Subjective: Interval History: has no complaint of nausea or vomiting. Patient has this cough and no sputum production. He said he's feeling much better than yesterday..  Objective: Vital signs in last 24 hours: Temp:  [97.5 F (36.4 C)-97.9 F (36.6 C)] 97.7 F (36.5 C) (03/06 0625) Pulse Rate:  [70-78] 70 (03/06 0625) Resp:  [20] 20 (03/06 0625) BP: (137-157)/(70-92) 146/76 mmHg (03/06 0625) SpO2:  [95 %-100 %] 97 % (03/06 0737) Weight change:   Intake/Output from previous day:   Intake/Output this shift:    General appearance: alert, cooperative and no distress Resp: rhonchi bilaterally and posterior - bilateral and wheezes posterior - bilateral Cardio: regular rate and rhythm, S1, S2 normal, no murmur, click, rub or gallop GI: soft, non-tender; bowel sounds normal; no masses,  no organomegaly Extremities: extremities normal, atraumatic, no cyanosis or edema  Lab Results:  Recent Labs  02/18/13 1402 02/20/13 0504  WBC 5.4 6.7  HGB 8.0* 7.6*  HCT 23.1* 22.7*  PLT 187 207   BMET:  Recent Labs  02/20/13 0504 02/21/13 0506  NA 137 137  K 3.7 3.5  CL 96 95*  CO2 26 26  GLUCOSE 105* 102*  BUN 28* 33*  CREATININE 5.97* 7.01*  CALCIUM 9.5 10.0   No results found for this basename: PTH,  in the last 72 hours Iron Studies:  Recent Labs  02/18/13 1545  IRON 65  TIBC 208*  FERRITIN 3673*    Studies/Results: No results found.  I have reviewed the patient's current medications.  Assessment/Plan: Problem #1 end-stage renal disease is status post hemodialysis on Tuesday. His BUN is 33 and creatinine is 7.01 with potassium of 3.5.  Problem #2 anemia his hemoglobin is 7.6 hematocrit 22.7 seems to be declining. This is most likely because of noncompliance with dialysis and infrequent administration of Epogen.  Problem #3 hypertension his blood pressure seems to be reasonably controlled Problem #4 bipolar disorder Problem #5 history of coronary artery disease   Problem #6 comment acquired pneumonia patient on antibiotics presently is a febrile with normal white blood cell count. Problem #7 metabolic bone disease his calcium and phosphorus is was in acceptable range. Plan: We'll make arrangements for patient to get dialysis today We'll try to get about 3-3 and half liters if his blood pressure tolerates. We'll continue his other medications as before.   LOS: 3 days   BEFEKADU,BELAYENH S 02/21/2013,8:57 AM
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
## 2013-02-21 NOTE — Progress Notes (Signed)
[id number] 

## 2013-02-22 NOTE — Progress Notes (Signed)
UR Chart Review Completed  

## 2013-02-22 NOTE — Progress Notes (Signed)
Subjective: Interval History: has no complaint of difficulty breathing. His cough is getting better so was his sputum production. Patient denies any nausea vomiting overall he seems to be getting better..  Objective: Vital signs in last 24 hours: Temp:  [97.6 F (36.4 C)-97.9 F (36.6 C)] 97.8 F (36.6 C) (03/07 0551) Pulse Rate:  [68-79] 68 (03/07 0551) Resp:  [20] 20 (03/07 0551) BP: (147-173)/(80-100) 151/84 mmHg (03/07 0551) SpO2:  [97 %-100 %] 99 % (03/07 0801) Weight:  [93.214 kg (205 lb 8 oz)] 93.214 kg (205 lb 8 oz) (03/07 0551) Weight change: -3.055 kg (-6 lb 11.8 oz)  Intake/Output from previous day: 03/06 0701 - 03/07 0700 In: 720 [P.O.:720] Out: 4027  Intake/Output this shift:    Generally he is alert in no apparent distress. Patient is walking around. Chest decreased breath sound otherwise he'll have any rales or rhonchi. Heart exam regular rate and rhythm Abdomen soft positive bowel sound Extremities he has 1+ edema.  Lab Results:  Recent Labs  02/20/13 0504  WBC 6.7  HGB 7.6*  HCT 22.7*  PLT 207   BMET:  Recent Labs  02/20/13 0504 02/21/13 0506  NA 137 137  K 3.7 3.5  CL 96 95*  CO2 26 26  GLUCOSE 105* 102*  BUN 28* 33*  CREATININE 5.97* 7.01*  CALCIUM 9.5 10.0   No results found for this basename: PTH,  in the last 72 hours Iron Studies: No results found for this basename: IRON, TIBC, TRANSFERRIN, FERRITIN,  in the last 72 hours  Studies/Results: Dg Chest 2 View  02/21/2013  *RADIOLOGY REPORT*  Clinical Data: Right middle lobe infiltrate  CHEST - 2 VIEW  Comparison: Chest radiograph 02/18/2013  Findings: Stable enlarged heart silhouette.  There is some improvement in the left lower lobe and left upper lobe air space disease.  No pneumothorax.  No pleural fluid.  Lungs are hyperinflated.  IMPRESSION: Some improvement in the right upper lobe and lower lobe air space disease.  Recommend follow-up radiographs after treatment to ensure resolution.    Original Report Authenticated By: Genevive Bi, M.D.     I have reviewed the patient's current medications.  Assessment/Plan: Problem #1 end-stage renal disease is status post hemodialysis yesterday his last BUN is 3 creatinine 7.01 with potassium of 3.5. Problem #2 CHF is status post ultrafiltration yesterday still he has edema but  progressively improving Problem #3 anemia this is secondary to lack of the Epogen administration because of inconsistent dialysis schedule. Patient occasionally misses dialysis and also a short of his dialysis. Problem #4 ischemic cardiomyopathy Problem #5 hypertension his blood pressure seems to be reasonably controlled Problem #6 bipolar disorder.  Problem #7 metabolic bone disease his calcium and phosphorus is was in acceptable range. Plan: We'll make arrangements for patient to get dialysis tomorrow We'll continue to remove about 4 L as well as his blood pressure tolerates Patient advised to cut down his salt and fluid intake We'll continue with Epogen on dialysis.   LOS: 4 days   BEFEKADU,BELAYENH S 02/22/2013,1:08 PM

## 2013-02-22 NOTE — Progress Notes (Signed)
[id number] 

## 2013-02-22 NOTE — Progress Notes (Signed)
NAME:  [name], ORNER NO.:  [id number]  MEDICAL RECORD NO.:  [id number]  LOCATION:  A335                          FACILITY:  [facility]  PHYSICIAN:  Melvyn Novas, MDDATE OF BIRTH:  1976/07/13  DATE OF PROCEDURE: DATE OF DISCHARGE:                                PROGRESS NOTE   The patient is currently undergoing hemodialysis, in for right middle lobe and lower lobe infiltrate, currently with healthcare acquired pneumonia on triple antibiotics, Levaquin, Maxipime, and vancomycin. Doing well.  Has multiple other medical problems, two-vessel cardiomyopathy, ejection fraction of 45%, COPD, continued smoking, personality disorder, bipolar disorder, schizoaffective disorder, hypertension, hyperlipidemia.  Blood pressure 164/89 prior to dialysis, temperature 97.4, pulse 70 and regular, respiratory rate is 20. Potassium 3.5, creatinine 7.0.  Lungs show diminished breath sounds at bases.  Scattered rhonchi.  Prolonged expiratory phase and mild end- expiratory wheeze.  Heart, regular rhythm.  No S3, S4.  No heaves, thrills, or rubs.  Abdomen, soft, nontender.  Bowel sounds normoactive.  The plan right now is to continue triple antibiotics, DuoNeb nebulizer, negative fluid balance via dialysis, and we will make further recommendations as the database expands.     Melvyn Novas, MD     RMD/MEDQ  D:  02/21/2013  T:  02/22/2013  Job:  [id number]

## 2013-02-23 LAB — CBC
HCT: 22.6 % — ABNORMAL LOW (ref 39.0–52.0)
Hemoglobin: 7.4 g/dL — ABNORMAL LOW (ref 13.0–17.0)
MCH: 32.2 pg (ref 26.0–34.0)
MCV: 98.3 fL (ref 78.0–100.0)
Platelets: 203 10*3/uL (ref 150–400)
RBC: 2.3 MIL/uL — ABNORMAL LOW (ref 4.22–5.81)
WBC: 8.1 10*3/uL (ref 4.0–10.5)

## 2013-02-23 LAB — BASIC METABOLIC PANEL
BUN: 30 mg/dL — ABNORMAL HIGH (ref 6–23)
CO2: 27 mEq/L (ref 19–32)
Chloride: 94 mEq/L — ABNORMAL LOW (ref 96–112)
Creatinine, Ser: 5.72 mg/dL — ABNORMAL HIGH (ref 0.50–1.35)
Glucose, Bld: 111 mg/dL — ABNORMAL HIGH (ref 70–99)

## 2013-02-23 MED ORDER — ALBUTEROL SULFATE (5 MG/ML) 0.5% IN NEBU: 2.5000 mg | INHALATION_SOLUTION | RESPIRATORY_TRACT | Status: DC | PRN

## 2013-02-23 MED ORDER — ALTEPLASE 2 MG IJ SOLR
2.0000 mg | Freq: Once | INTRAMUSCULAR | Status: DC | PRN
  Filled 2013-02-23: qty 2

## 2013-02-23 MED ORDER — LIDOCAINE-PRILOCAINE 2.5-2.5 % EX CREA: 1.0000 "application " | TOPICAL_CREAM | CUTANEOUS | Status: DC | PRN

## 2013-02-23 MED ORDER — IPRATROPIUM BROMIDE 0.02 % IN SOLN: 0.5000 mg | Freq: Three times a day (TID) | RESPIRATORY_TRACT | Status: DC

## 2013-02-23 MED ORDER — LIDOCAINE HCL (PF) 1 % IJ SOLN
5.0000 mL | INTRAMUSCULAR | Status: DC | PRN
  Filled 2013-02-23: qty 5

## 2013-02-23 MED ORDER — NEPRO/CARBSTEADY PO LIQD
237.0000 mL | ORAL | Status: DC | PRN
  Filled 2013-02-23: qty 237

## 2013-02-23 MED ORDER — PENTAFLUOROPROP-TETRAFLUOROETH EX AERO
1.0000 "application " | INHALATION_SPRAY | CUTANEOUS | Status: DC | PRN
  Filled 2013-02-23: qty 103.5

## 2013-02-23 MED ORDER — SODIUM CHLORIDE 0.9 % IV SOLN: 100.0000 mL | INTRAVENOUS | Status: DC | PRN

## 2013-02-23 MED ORDER — HEPARIN SODIUM (PORCINE) 1000 UNIT/ML DIALYSIS
300.0000 [IU] | INTRAMUSCULAR | Status: DC | PRN
  Administered 2013-02-23: 300 [IU] via INTRAVENOUS_CENTRAL
  Filled 2013-02-23: qty 1

## 2013-02-23 MED ORDER — IPRATROPIUM BROMIDE 0.02 % IN SOLN: 0.5000 mg | RESPIRATORY_TRACT | Status: DC | PRN

## 2013-02-23 NOTE — Progress Notes (Signed)
[id number] 

## 2013-02-23 NOTE — Progress Notes (Signed)
[name]  MRN: [id number]  DOB/AGE: Feb 09, 1976 37 y.o.  Primary Care Physician:DONDIEGO,RICHARD M, MD  Admit date: 02/18/2013  Chief Complaint:  Chief Complaint  Patient presents with  . Shortness of Breath    S-Pt presented on  02/18/2013 with  Chief Complaint  Patient presents with  . Shortness of Breath  .  Pt today feels better Pt seen on dialysis. Pt tolerating tx well.   Meds . ipratropium  0.5 mg Nebulization TID   And  . albuterol  2.5 mg Nebulization TID  . amLODipine  10 mg Oral Daily  . aspirin EC  81 mg Oral Daily  . carvedilol  12.5 mg Oral BID WC  . ceFEPime (MAXIPIME) IV  2 g Intravenous Q T,Th,Sa-HD  . cloNIDine  0.2 mg Oral BID  . DULoxetine  60 mg Oral Daily  . enoxaparin (LOVENOX) injection  30 mg Subcutaneous Q24H  . epoetin alfa  10,000 Units Intravenous 3 times weekly  . hydrALAZINE  50 mg Oral TID  . hydrOXYzine  25 mg Oral BID  . labetalol  400 mg Oral TID  . levofloxacin (LEVAQUIN) IV  500 mg Intravenous Q48H  . lisinopril  20 mg Oral BID  . OLANZapine  10 mg Oral BID  . pantoprazole  40 mg Oral Daily  . rOPINIRole  1 mg Oral QHS  . sevelamer carbonate  4,000 mg Oral TID WC  . simvastatin  20 mg Oral q1800  . topiramate  50 mg Oral QHS  . vancomycin  1,000 mg Intravenous Q T,Th,Sa-HD         Physical Exam: Vital signs in last 24 hours: Temp:  [97.3 F (36.3 C)-98.6 F (37 C)] 97.8 F (36.6 C) (03/08 0519) Pulse Rate:  [70-71] 71 (03/08 0519) Resp:  [18-20] 18 (03/08 0519) BP: (131-160)/(74-88) 131/74 mmHg (03/08 0519) SpO2:  [10 %-100 %] 100 % (03/08 0656) Weight change:  Last BM Date: 02/22/13  Intake/Output from previous day: 03/07 0701 - 03/08 0700 In: 240 [P.O.:240] Out: -      Physical Exam: General- pt is awake,alert, oriented to time place and person Resp- No acute REsp distress, CTA B/L NO Rhonchi CVS- S1S2 regular in rate and rhythm GIT- BS+, soft, NT, ND EXT- NO LE Edema, Cyanosis Access- left AVf +  thrill-two needles in situ  Lab Results: CBC  Recent Labs  02/23/13 0532  WBC 8.1  HGB 7.4*  HCT 22.6*  PLT 203    BMET  Recent Labs  02/21/13 0506 02/23/13 0532  NA 137 135  K 3.5 4.2  CL 95* 94*  CO2 26 27  GLUCOSE 102* 111*  BUN 33* 30*  CREATININE 7.01* 5.72*  CALCIUM 10.0 10.0    MICRO Recent Results (from the past 240 hour(s))  CULTURE, BLOOD (ROUTINE X 2)     Status: None   Collection Time    02/18/13  3:59 PM      Result Value Range Status   Specimen Description BLOOD RIGHT HAND   Final   Special Requests BOTTLES DRAWN AEROBIC AND ANAEROBIC 6CC EACH   Final   Culture NO GROWTH 3 DAYS   Final   Report Status PENDING   Incomplete  CULTURE, BLOOD (ROUTINE X 2)     Status: None   Collection Time    02/18/13  4:08 PM      Result Value Range Status   Specimen Description BLOOD RIGHT FOREARM   Final   Special Requests BOTTLES DRAWN  AEROBIC AND ANAEROBIC [facility] EACH   Final   Culture NO GROWTH 3 DAYS   Final   Report Status PENDING   Incomplete      Lab Results  Component Value Date   CALCIUM 10.0 02/23/2013   CAION 1.26* 06/27/2012   PHOS 3.8 02/20/2013        Impression: 1)Renal  ESRD on HD ON TTS schedule Hx of NON Compliance Was last Dialyzed on Thursday  Pt is being  dialyzed today .   2)HTN Target Organ damage  CKD  Medication- On RAS blockers-  On Calcium Channel Blockers On Alpha and beta Blockers  On Vasodilators ON [facility]  3)Anemia HGb not at goal (9--11) On EPO  4)CKD Mineral-Bone Disorder PTH not avail Secondary Hyperparathyroidism w/u pending  Phosphorus at goal.   5)ID- Admitted with Cough-Possible Pneumonia  6)FEN  Normokalemic Hyponatremic-now beter    7)Acid base Co2 at goal     Plan:  Pt is being  dialyzed today . Will continue current tx.    BHUTANI,MANPREET S 02/23/2013, 9:21 AM

## 2013-02-23 NOTE — Procedures (Signed)
4 hour hemodialysis treatment completed through left upper arm AVF (15g needles / antegrade placement).  GOAL MET: BP tolerated removal of 4 liters with no interruption in ultrafiltration.  All blood was reinfused.  Hemostasis achieved within 12 minutes.  Report given to Achilles Dunk, RN.

## 2013-02-23 NOTE — Progress Notes (Signed)
[name], KRYDER NO.:  [id number]  MEDICAL RECORD NO.:  [id number]  LOCATION:  A335                          FACILITY:  [facility]  PHYSICIAN:  Melvyn Novas, MDDATE OF BIRTH:  08-10-1976  DATE OF PROCEDURE: DATE OF DISCHARGE:                                PROGRESS NOTE   SUBJECTIVE:  The patient is in with several right lobe infiltrates. Chest x-ray done yesterday reveals partial improvement of the infiltrates radiographically.  Currently, on 3 antibiotics for hospital associated pneumonia, since he was on dialysis he also likewise has ischemic cardiomyopathy, ejection fraction of 45%; anemia of chronic disease; end-stage renal disease, on dialysis; hypertension; hyperlipidemia; COPD, continued smoking nicotine and tobacco abuse; schizoaffective disorder; bipolar disorder; and personality disorder. Blood pressure is much improved.  Potassium 3.5.  OBJECTIVE:  VITAL SIGNS:  Blood pressure 151/84, temperature 97.8, pulse 68 and regular, respiratory rate is 20. LUNGS:  Show mild end-expiratory wheeze.  Scattered rhonchi.  No rales are audible. HEART:  Regular rhythm.  No S3 or S4.  No heaves, thrills, or rubs. EXTREMITIES:  1+ chronic pedal edema.  IMPRESSION AND PLAN:  Right now is continue Maxipime and vancomycin as well as Levaquin.  Monitor renal function.  Continue dialysis.  Continue intravenous antibiotics, DuoNeb nebulizers for over approximately 3 more days.  We will re-evaluate him at that time.     Melvyn Novas, MD     RMD/MEDQ  D:  02/22/2013  T:  02/23/2013  Job:  [id number]

## 2013-02-24 IMAGING — CR DG CHEST 2V
2 series · 2 of 2 positions shown · non-contrast
Comparison: 08/05/2011

CLINICAL DATA: Chest pain, history MI, smoking

CHEST - 2 VIEW

[view not recorded (1 of 2)]
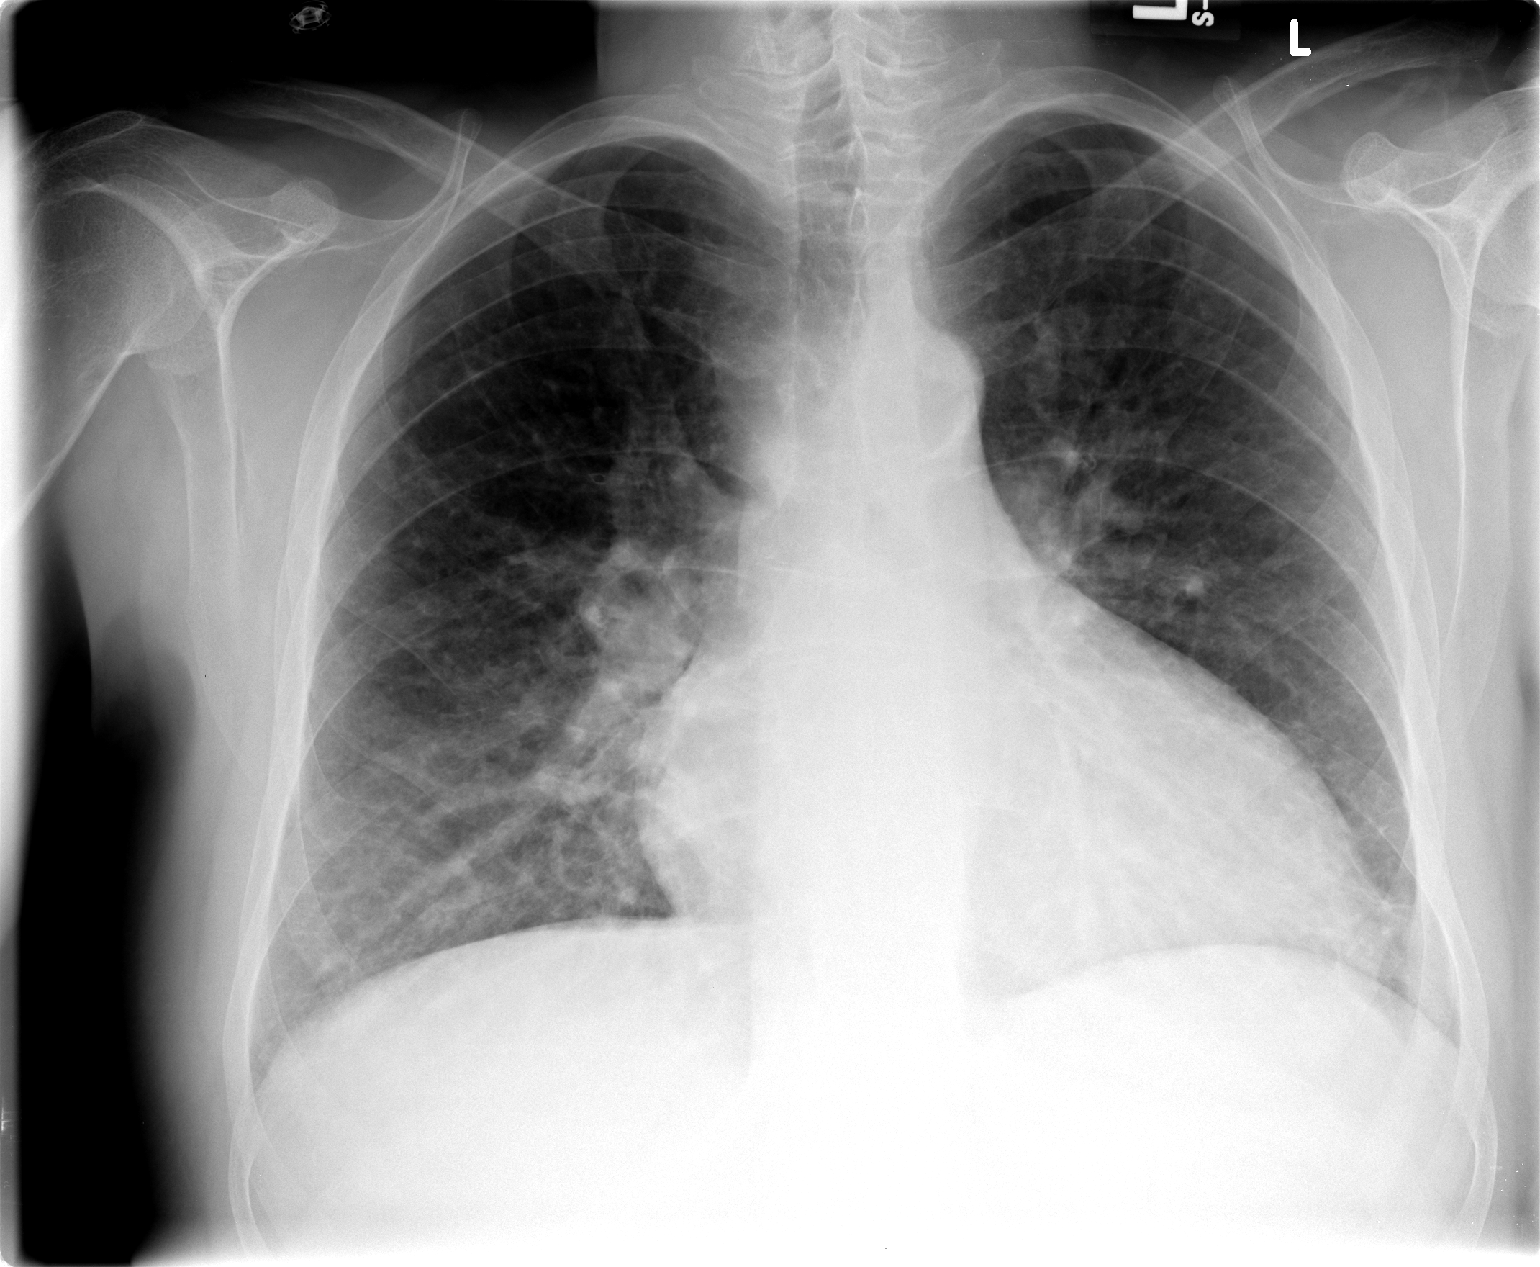

[view not recorded (2 of 2)]
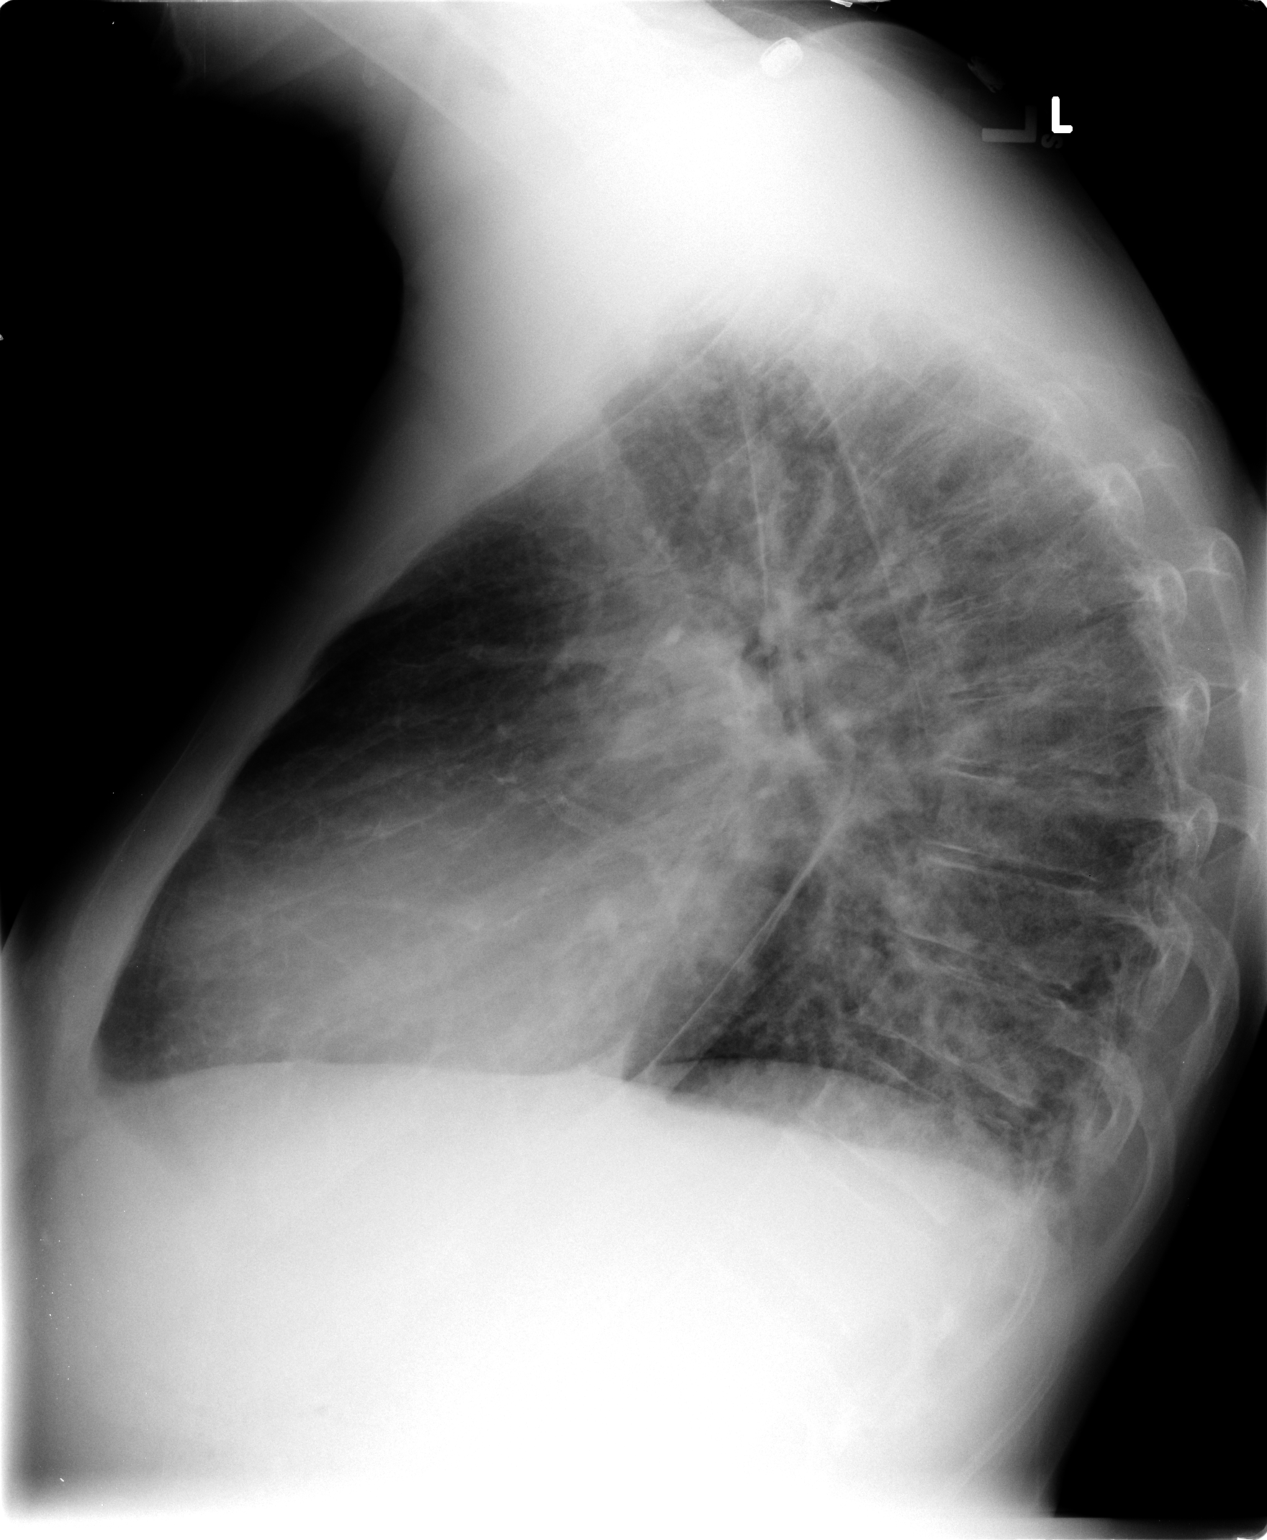

[2 of 2 positions shown; findings below may reference images not displayed]

FINDINGS: Enlargement of cardiac silhouette.
Pulmonary vascular congestion.
Atherosclerotic calcification aorta.
Peribronchial thickening.
Minimal interstitial prominence, less than on previous exam, likely
improved edema.
No segmental consolidation, pleural effusion or pneumothorax.
Diffuse osseous demineralization.
Minimal height loss of a mid thoracic vertebra unchanged since
06/16/2011.
IMPRESSION: Enlargement of cardiac silhouette with pulmonary venous
hypertension.
Improved pulmonary edema.

## 2013-02-24 MED ORDER — LEVOFLOXACIN 500 MG PO TABS: 250.0000 mg | ORAL_TABLET | ORAL | Status: DC

## 2013-02-24 MED ORDER — LEVOFLOXACIN 500 MG PO TABS: 500.0000 mg | ORAL_TABLET | Freq: Every day | ORAL | Status: DC

## 2013-02-24 MED ORDER — SEVELAMER CARBONATE 800 MG PO TABS: 2400.0000 mg | ORAL_TABLET | Freq: Two times a day (BID) | ORAL | Status: DC | PRN
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
## 2013-02-24 NOTE — Progress Notes (Signed)
NAMEMAXDEN, [name] NO.:  [id number]  MEDICAL RECORD NO.:  [id number]  LOCATION:  A335                          FACILITY:  [facility]  PHYSICIAN:  Melvyn Novas, MDDATE OF BIRTH:  February 14, 1976  DATE OF PROCEDURE: DATE OF DISCHARGE:                                PROGRESS NOTE   The patient currently undergoing hemodialysis.  Has right upper lobe and right lower lobe infiltrate.  On triple antibiotics for healthcare- acquired pneumonia, hypertension, hyperlipidemia, ischemic cardiomyopathy, COPD, continued cigarette abuse.  Doing well.  Breathing is little bit better, less cough, hemodynamically more stable.  Blood pressure 158/88, temperature 97.5, respiratory rate is 20, O2 sat is 99%.  Hemoglobin 7.4, on erythropoietin, creatinine 5.7.  Lungs show scattered rhonchi, less end-expiratory wheeze today than previous, and rales audible.  Heart; regular rhythm.  No S3 or S4.  No heaves, thrills, or rubs.  Extremities; 1+ chronic pitting edema.  PLAN:  Right now is to continue current therapy.  Dialysis on Monday, and possible discharge if he continues his progress.     Melvyn Novas, MD     RMD/MEDQ  D:  02/23/2013  T:  02/24/2013  Job:  [id number]

## 2013-02-24 NOTE — Discharge Summary (Signed)
[id number] 

## 2013-02-24 NOTE — Progress Notes (Signed)
[name]  MRN: [id number]  DOB/AGE: 39-Jan-1977 39 y.o.  Primary Care Physician:DONDIEGO,RICHARD M, MD  Admit date: 02/18/2013  Chief Complaint:  Chief Complaint  Patient presents with  . Shortness of Breath    S-Pt presented on  02/18/2013 with  Chief Complaint  Patient presents with  . Shortness of Breath  .  Pt today feels better. Pt is leaving AMA. Tried my best to educate but pt wants to leave.     Meds . amLODipine  10 mg Oral Daily  . aspirin EC  81 mg Oral Daily  . carvedilol  12.5 mg Oral BID WC  . ceFEPime (MAXIPIME) IV  2 g Intravenous Q T,Th,Sa-HD  . cloNIDine  0.2 mg Oral BID  . DULoxetine  60 mg Oral Daily  . enoxaparin (LOVENOX) injection  30 mg Subcutaneous Q24H  . epoetin alfa  10,000 Units Intravenous 3 times weekly  . hydrALAZINE  50 mg Oral TID  . hydrOXYzine  25 mg Oral BID  . labetalol  400 mg Oral TID  . levofloxacin  500 mg Oral Daily  . lisinopril  20 mg Oral BID  . OLANZapine  10 mg Oral BID  . pantoprazole  40 mg Oral Daily  . rOPINIRole  1 mg Oral QHS  . sevelamer carbonate  4,000 mg Oral TID WC  . simvastatin  20 mg Oral q1800  . topiramate  50 mg Oral QHS  . vancomycin  1,000 mg Intravenous Q T,Th,Sa-HD         Physical Exam: Vital signs in last 24 hours: Temp:  [97.4 F (36.3 C)-98.5 F (36.9 C)] 97.7 F (36.5 C) (03/09 0647) Pulse Rate:  [69-83] 69 (03/09 0647) Resp:  [16-19] 17 (03/09 0647) BP: (133-166)/(72-98) 133/72 mmHg (03/09 0647) SpO2:  [99 %-100 %] 100 % (03/09 0647) Weight change:  Last BM Date: 02/22/13  Intake/Output from previous day: 03/08 0701 - 03/09 0700 In: 240 [P.O.:240] Out: 4001      Physical Exam: General- pt is awake,alert, oriented to time place and person Resp- No acute REsp distress, CTA B/L NO Rhonchi CVS- S1S2 regular in rate and rhythm GIT- BS+, soft, NT, ND EXT- NO LE Edema, Cyanosis Access- left AVf + thrill  Lab Results: CBC  Recent Labs  02/23/13 0532  WBC 8.1  HGB  7.4*  HCT 22.6*  PLT 203    BMET  Recent Labs  02/23/13 0532  NA 135  K 4.2  CL 94*  CO2 27  GLUCOSE 111*  BUN 30*  CREATININE 5.72*  CALCIUM 10.0    MICRO Recent Results (from the past 240 hour(s))  CULTURE, BLOOD (ROUTINE X 2)     Status: None   Collection Time    02/18/13  3:59 PM      Result Value Range Status   Specimen Description BLOOD RIGHT HAND   Final   Special Requests BOTTLES DRAWN AEROBIC AND ANAEROBIC 6CC EACH   Final   Culture NO GROWTH 3 DAYS   Final   Report Status PENDING   Incomplete  CULTURE, BLOOD (ROUTINE X 2)     Status: None   Collection Time    02/18/13  4:08 PM      Result Value Range Status   Specimen Description BLOOD RIGHT FOREARM   Final   Special Requests BOTTLES DRAWN AEROBIC AND ANAEROBIC 6CC EACH   Final   Culture NO GROWTH 3 DAYS   Final   Report Status PENDING   Incomplete  Lab Results  Component Value Date   CALCIUM 10.0 02/23/2013   CAION 1.26* 06/27/2012   PHOS 3.8 02/20/2013        Impression: 1)Renal  ESRD on HD ON TTS schedule Hx of NON Compliance Pt last dilazed yesterday NO need of Hd today  2)HTN Target Organ damage  CKD  Medication- On RAS blockers-  On Calcium Channel Blockers On Alpha and beta Blockers  On Vasodilators ON [employer]path[employer]  3)Anemia HGb not at goal (9--11) On EPO  4)CKD Mineral-Bone Disorder PTH not avail Secondary Hyperparathyroidism w/u pending  Phosphorus at goal.   5)ID- Admitted with Cough-Possible Pneumonia  6)FEN  Normokalemic Hyponatremic-now beter    7)Acid base Co2 at goal     Plan:  Pt leaving AMA.  if he stays Will continue current tx.    BHUTANI,MANPREET S 02/24/2013, 12:17 PM

## 2013-02-24 NOTE — Progress Notes (Signed)
Pt. Leaving AMA. Dr. Felecia Shelling and Dr. Janna Arch notified. AMA form signed, IV removed. Implications of leaving AMA discussed with pt. Specifically the risk involved. Pt. Plans to follow up on his own with Dr. Janna Arch tomorrow.

## 2013-02-25 ENCOUNTER — Other Ambulatory Visit: Payer: Self-pay | Admitting: Gastroenterology

## 2013-02-25 LAB — CULTURE, BLOOD (ROUTINE X 2): Culture: NO GROWTH

## 2013-02-25 NOTE — Discharge Summary (Signed)
NAME:  [name], [name] NO.:  [id number]  MEDICAL RECORD NO.:  [id number]  LOCATION:                                 FACILITY:  PHYSICIAN:  Melvyn Novas, MDDATE OF BIRTH:  08-24-1976  DATE OF ADMISSION:  02/18/2013 DATE OF DISCHARGE:  03/09/2014LH                              DISCHARGE SUMMARY   HISTORY OF PRESENT ILLNESS:  The patient is a 37 year old white male with legion medical problems including two-vessel disease, ischemic cardiomyopathy, EF 45%, hypertension, hyperlipidemia, COPD, continued smoking, schizoaffective disorder, bipolar disorder, personality disorder, hypertension, hyperlipidemia, end-stage renal disease, on dialysis.  The patient was admitted with a right upper lobe and right middle lobe infiltrate and treated as healthcare-acquired pneumonia on 3 intravenous antibiotics, Maxipime and vancomycin and Levaquin.  He was given appropriate dialysis every other day and continued on a schedule. He was hemodynamically stable in the hospital.  Blood pressure was high normal, which was normal for him.  He had aggressive nebulizer therapy. Chest x-ray showed some significant clearing of bilobar infiltrates on the fifth hospital day, was subsequently discharged on day 7.  He had no ischemia and no angina and no clinical congestive heart failure during his stay in the hospital.  No dysrhythmias.  He was urged not to smoke cigarettes, to attend smoking cessation classes multiple times previously as well as on this discharge, also urged to take of his medicines which are essentially the same as admission medicines with the exception of Levaquin 250 mg p.o. every other day, pills #4 for total of 8 days.  DISCHARGE MEDICATIONS:  Norvasc 10 mg p.o. daily, aspirin 81 p.o. daily, Coreg 12.5 p.o. b.i.d., clonidine 0.2 p.o. b.i.d., Dexilant 60 p.o. daily, hydralazine 50 mg p.o. t.i.d., hydroxyzine 25 mg p.o. t.i.d. p.r.n., labetalol 400 mg p.o.  t.i.d., Zestril 20 mg p.o. daily, Zyprexa 10 mg p.o. daily, Requip 1 mg p.o. at bedtime, Crestor 20 mg p.o. daily, Topamax 50 mg p.o. at bedtime, Ambien 10 mg p.o. at bedtime p.r.n., albuterol and Atrovent nebulizers as prescribed, and oxycodone 5/325 p.o. q.i.d. p.r.n. for pain.  He has this prescription at home.  His only new prescription is for Levaquin as aforementioned.  He will follow up in the office in 2 days' time.     Melvyn Novas, MD     RMD/MEDQ  D:  02/24/2013  T:  02/25/2013  Job:  [id number]

## 2013-02-28 ENCOUNTER — Encounter (HOSPITAL_COMMUNITY): Payer: Self-pay | Admitting: *Deleted

## 2013-02-28 ENCOUNTER — Emergency Department (HOSPITAL_COMMUNITY)
Admission: EM | Admit: 2013-02-28 | Discharge: 2013-02-28 | Disposition: A | Discharge status: Home / Self Care | Payer: Medicare Other | Attending: Emergency Medicine | Admitting: Emergency Medicine

## 2013-02-28 DIAGNOSIS — Z862 Personal history of diseases of the blood and blood-forming organs and certain disorders involving the immune mechanism: Secondary | ICD-10-CM | POA: Insufficient documentation

## 2013-02-28 DIAGNOSIS — F319 Bipolar disorder, unspecified: Secondary | ICD-10-CM | POA: Insufficient documentation

## 2013-02-28 DIAGNOSIS — R0602 Shortness of breath: Secondary | ICD-10-CM | POA: Insufficient documentation

## 2013-02-28 DIAGNOSIS — R109 Unspecified abdominal pain: Secondary | ICD-10-CM | POA: Insufficient documentation

## 2013-02-28 DIAGNOSIS — Z79899 Other long term (current) drug therapy: Secondary | ICD-10-CM | POA: Insufficient documentation

## 2013-02-28 DIAGNOSIS — G8929 Other chronic pain: Secondary | ICD-10-CM | POA: Insufficient documentation

## 2013-02-28 DIAGNOSIS — J4489 Other specified chronic obstructive pulmonary disease: Secondary | ICD-10-CM | POA: Insufficient documentation

## 2013-02-28 DIAGNOSIS — I120 Hypertensive chronic kidney disease with stage 5 chronic kidney disease or end stage renal disease: Secondary | ICD-10-CM | POA: Insufficient documentation

## 2013-02-28 DIAGNOSIS — G43909 Migraine, unspecified, not intractable, without status migrainosus: Secondary | ICD-10-CM

## 2013-02-28 DIAGNOSIS — F17200 Nicotine dependence, unspecified, uncomplicated: Secondary | ICD-10-CM | POA: Insufficient documentation

## 2013-02-28 DIAGNOSIS — Z992 Dependence on renal dialysis: Secondary | ICD-10-CM | POA: Insufficient documentation

## 2013-02-28 DIAGNOSIS — Z8679 Personal history of other diseases of the circulatory system: Secondary | ICD-10-CM | POA: Insufficient documentation

## 2013-02-28 DIAGNOSIS — Z7982 Long term (current) use of aspirin: Secondary | ICD-10-CM | POA: Insufficient documentation

## 2013-02-28 DIAGNOSIS — M549 Dorsalgia, unspecified: Secondary | ICD-10-CM

## 2013-02-28 DIAGNOSIS — N186 End stage renal disease: Secondary | ICD-10-CM | POA: Insufficient documentation

## 2013-02-28 DIAGNOSIS — F209 Schizophrenia, unspecified: Secondary | ICD-10-CM | POA: Insufficient documentation

## 2013-02-28 DIAGNOSIS — Z8701 Personal history of pneumonia (recurrent): Secondary | ICD-10-CM | POA: Insufficient documentation

## 2013-02-28 HISTORY — DX: Pneumonia, unspecified organism: J18.9

## 2013-02-28 MED ORDER — MORPHINE SULFATE 4 MG/ML IJ SOLN
8.0000 mg | Freq: Once | INTRAMUSCULAR | Status: AC
  Administered 2013-02-28: 8 mg via INTRAMUSCULAR
  Filled 2013-02-28: qty 2

## 2013-02-28 MED ORDER — ONDANSETRON HCL 4 MG PO TABS
4.0000 mg | ORAL_TABLET | Freq: Once | ORAL | Status: AC
  Administered 2013-02-28: 4 mg via ORAL
  Filled 2013-02-28: qty 1
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
## 2013-02-28 NOTE — ED Notes (Signed)
Migraine HA began this morning, w/hx of same.  Chronic back pain. Dialyzed today.

## 2013-03-01 NOTE — ED Provider Notes (Signed)
Medical screening examination/treatment/procedure(s) were performed by non-physician practitioner and as supervising physician I was immediately available for consultation/collaboration.   Laray Anger, DO 03/01/13 [phone]

## 2013-03-07 ENCOUNTER — Other Ambulatory Visit: Payer: Self-pay | Admitting: Gastroenterology

## 2013-03-07 MED ORDER — LANSOPRAZOLE 30 MG PO CPDR: 30.0000 mg | DELAYED_RELEASE_CAPSULE | Freq: Every day | ORAL | Status: DC

## 2013-03-07 NOTE — Progress Notes (Signed)
Insurance does not [employer]. Patient has previously tried Doctor, general practice. Will cover Prevacid. Prevacid sent to pharmacy.

## 2013-03-16 ENCOUNTER — Emergency Department (HOSPITAL_COMMUNITY)
Admission: EM | Admit: 2013-03-16 | Discharge: 2013-03-16 | Disposition: A | Discharge status: Home / Self Care | Payer: Medicare Other | Attending: Emergency Medicine | Admitting: Emergency Medicine

## 2013-03-16 ENCOUNTER — Encounter (HOSPITAL_COMMUNITY): Payer: Self-pay | Admitting: *Deleted

## 2013-03-16 DIAGNOSIS — R079 Chest pain, unspecified: Secondary | ICD-10-CM | POA: Insufficient documentation

## 2013-03-16 DIAGNOSIS — I509 Heart failure, unspecified: Secondary | ICD-10-CM | POA: Insufficient documentation

## 2013-03-16 DIAGNOSIS — Z8679 Personal history of other diseases of the circulatory system: Secondary | ICD-10-CM | POA: Insufficient documentation

## 2013-03-16 DIAGNOSIS — I120 Hypertensive chronic kidney disease with stage 5 chronic kidney disease or end stage renal disease: Secondary | ICD-10-CM | POA: Insufficient documentation

## 2013-03-16 DIAGNOSIS — F17200 Nicotine dependence, unspecified, uncomplicated: Secondary | ICD-10-CM | POA: Insufficient documentation

## 2013-03-16 DIAGNOSIS — Z8701 Personal history of pneumonia (recurrent): Secondary | ICD-10-CM | POA: Insufficient documentation

## 2013-03-16 DIAGNOSIS — G894 Chronic pain syndrome: Secondary | ICD-10-CM | POA: Insufficient documentation

## 2013-03-16 DIAGNOSIS — F209 Schizophrenia, unspecified: Secondary | ICD-10-CM | POA: Insufficient documentation

## 2013-03-16 DIAGNOSIS — M545 Low back pain, unspecified: Secondary | ICD-10-CM | POA: Insufficient documentation

## 2013-03-16 DIAGNOSIS — R61 Generalized hyperhidrosis: Secondary | ICD-10-CM | POA: Insufficient documentation

## 2013-03-16 DIAGNOSIS — Z8639 Personal history of other endocrine, nutritional and metabolic disease: Secondary | ICD-10-CM | POA: Insufficient documentation

## 2013-03-16 DIAGNOSIS — D649 Anemia, unspecified: Secondary | ICD-10-CM | POA: Insufficient documentation

## 2013-03-16 DIAGNOSIS — Z9861 Coronary angioplasty status: Secondary | ICD-10-CM | POA: Insufficient documentation

## 2013-03-16 DIAGNOSIS — Z7982 Long term (current) use of aspirin: Secondary | ICD-10-CM | POA: Insufficient documentation

## 2013-03-16 DIAGNOSIS — Z87828 Personal history of other (healed) physical injury and trauma: Secondary | ICD-10-CM | POA: Insufficient documentation

## 2013-03-16 DIAGNOSIS — Z862 Personal history of diseases of the blood and blood-forming organs and certain disorders involving the immune mechanism: Secondary | ICD-10-CM | POA: Insufficient documentation

## 2013-03-16 DIAGNOSIS — G8929 Other chronic pain: Secondary | ICD-10-CM | POA: Insufficient documentation

## 2013-03-16 DIAGNOSIS — Z992 Dependence on renal dialysis: Secondary | ICD-10-CM | POA: Insufficient documentation

## 2013-03-16 DIAGNOSIS — G43909 Migraine, unspecified, not intractable, without status migrainosus: Secondary | ICD-10-CM | POA: Insufficient documentation

## 2013-03-16 DIAGNOSIS — F319 Bipolar disorder, unspecified: Secondary | ICD-10-CM | POA: Insufficient documentation

## 2013-03-16 DIAGNOSIS — J209 Acute bronchitis, unspecified: Secondary | ICD-10-CM | POA: Insufficient documentation

## 2013-03-16 DIAGNOSIS — J440 Chronic obstructive pulmonary disease with acute lower respiratory infection: Secondary | ICD-10-CM | POA: Insufficient documentation

## 2013-03-16 DIAGNOSIS — N186 End stage renal disease: Secondary | ICD-10-CM | POA: Insufficient documentation

## 2013-03-16 DIAGNOSIS — Z79899 Other long term (current) drug therapy: Secondary | ICD-10-CM | POA: Insufficient documentation

## 2013-03-16 DIAGNOSIS — M549 Dorsalgia, unspecified: Secondary | ICD-10-CM

## 2013-03-16 DIAGNOSIS — R51 Headache: Secondary | ICD-10-CM

## 2013-03-16 IMAGING — CR DG CHEST 2V
2 series · 2 of 2 positions shown · non-contrast
Comparison: Chest radiograph performed 08/12/2011

CLINICAL DATA: Shortness of breath; history of smoking.

CHEST - 2 VIEW

[view not recorded (1 of 2)]
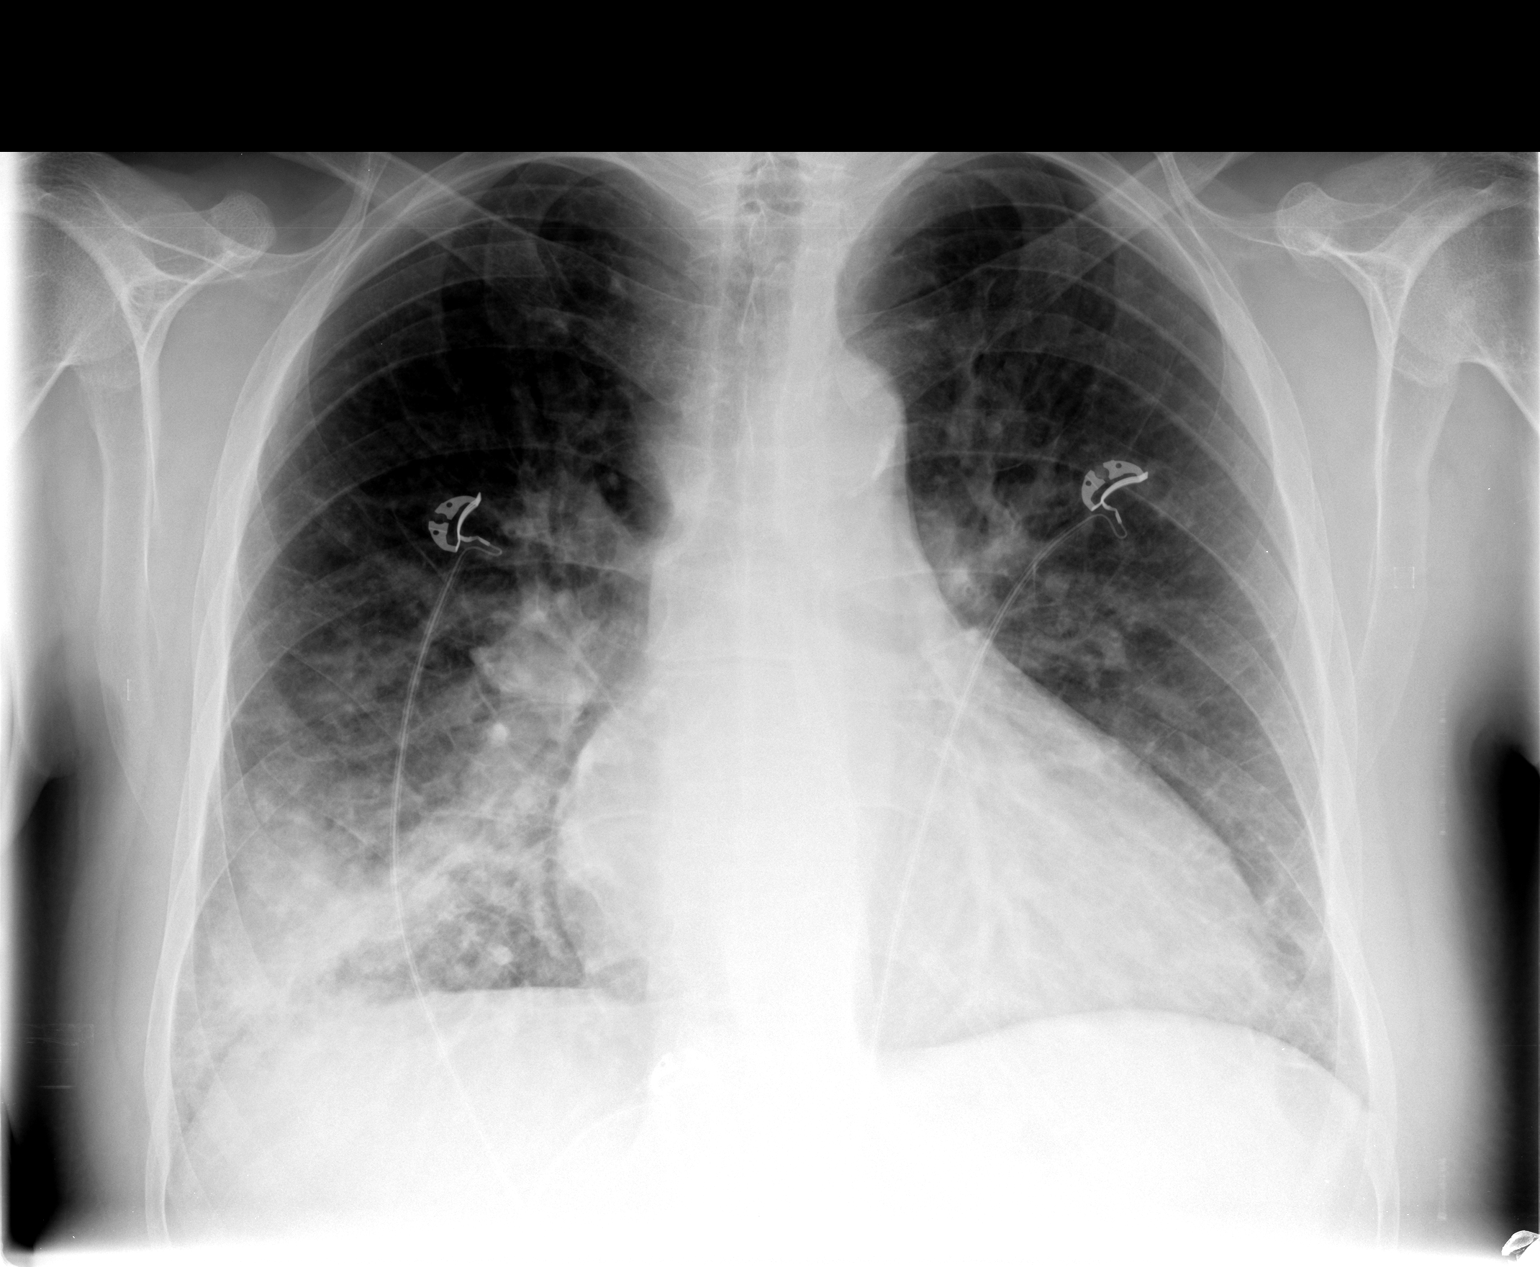

[view not recorded (2 of 2)]
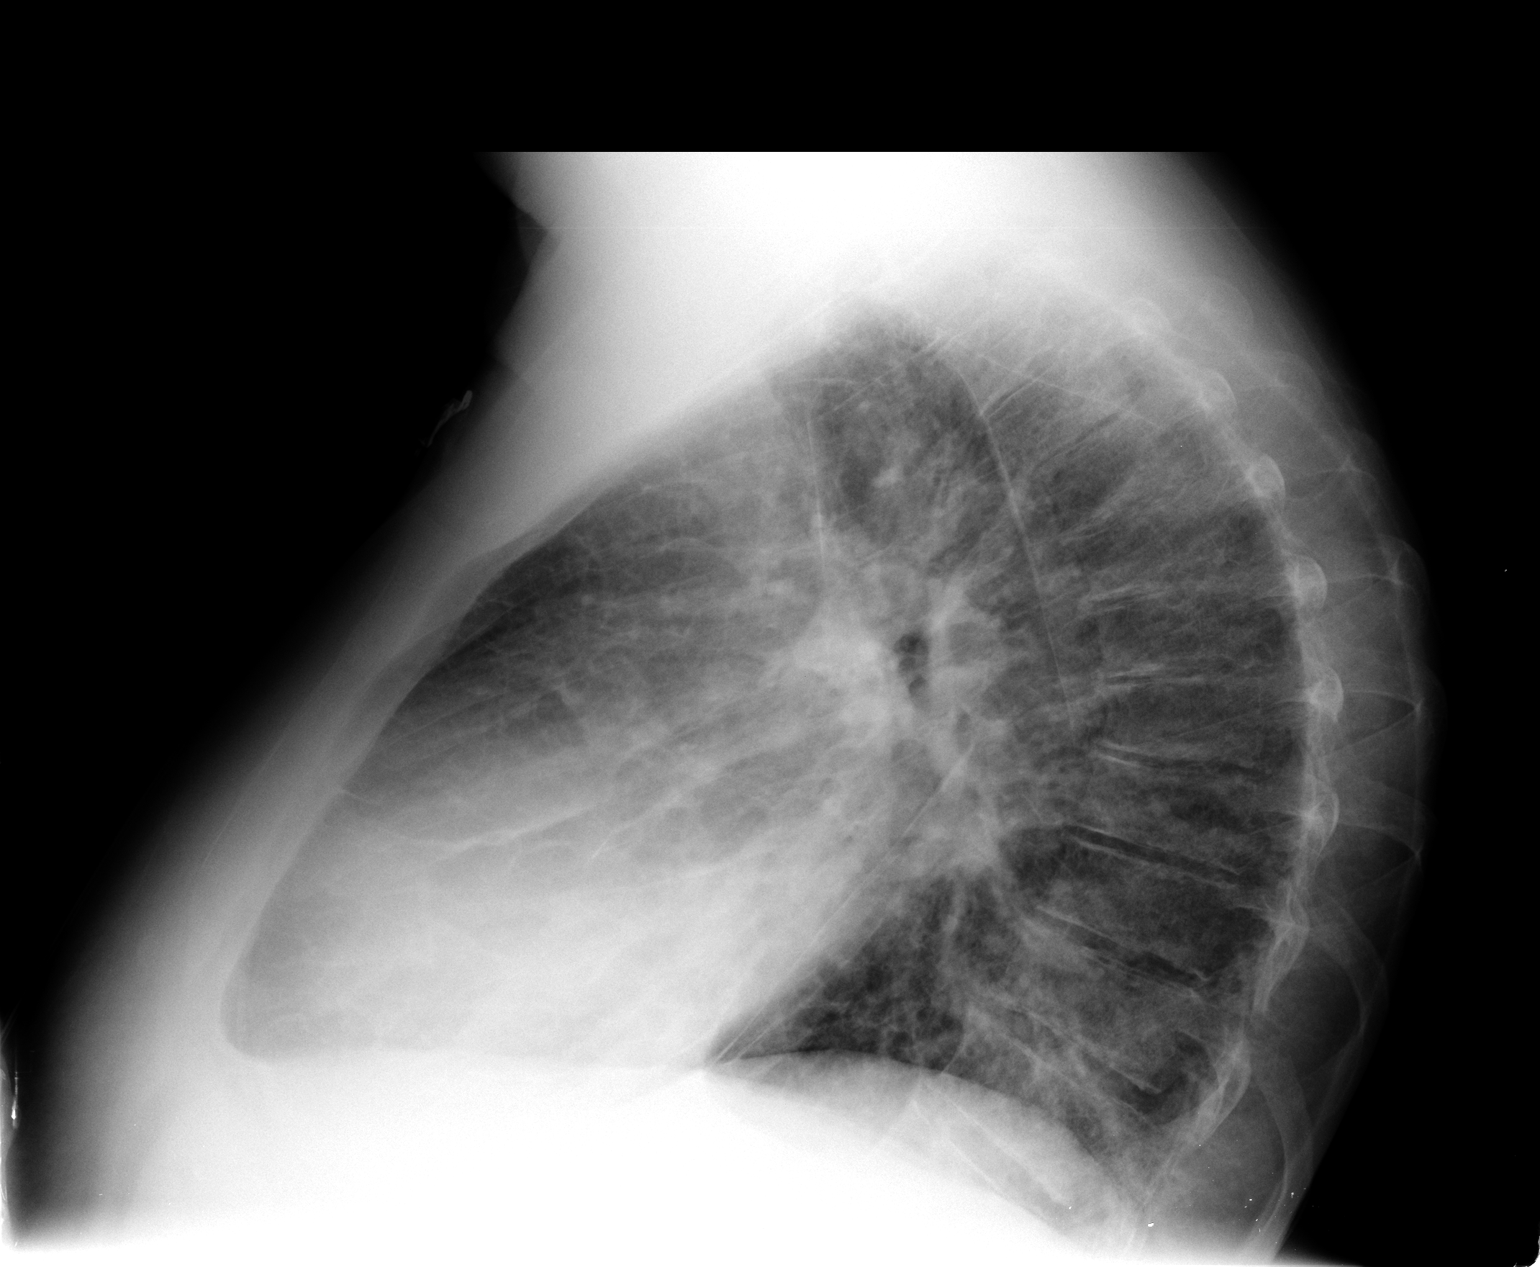

[2 of 2 positions shown; findings below may reference images not displayed]

FINDINGS: The lungs are well-aerated.  Dense right basilar
opacification raises concern for pneumonia.  There is no evidence
of pleural effusion or pneumothorax.

The heart is borderline enlarged; calcification is noted within the
aortic arch.  Vascular congestion is noted, with suggestion of mild
interstitial edema.  No acute osseous abnormalities are seen.
IMPRESSION: 1.  Dense right basilar pneumonia.
2.  Vascular congestion and borderline cardiomegaly, with
suggestion of mild interstitial edema.

## 2013-03-16 MED ORDER — ONDANSETRON HCL 4 MG PO TABS
4.0000 mg | ORAL_TABLET | Freq: Once | ORAL | Status: AC
  Administered 2013-03-16: 4 mg via ORAL
  Filled 2013-03-16: qty 1

## 2013-03-16 MED ORDER — MORPHINE SULFATE 4 MG/ML IJ SOLN
8.0000 mg | Freq: Once | INTRAMUSCULAR | Status: AC
  Administered 2013-03-16: 8 mg via INTRAMUSCULAR
  Filled 2013-03-16: qty 2
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
## 2013-03-16 NOTE — ED Notes (Signed)
Pt c/o migraine headache that started last night, pt states that the migraine is the same as what he has had in the past, c/o lower back pain that started two days ago, denies any injury.

## 2013-03-17 NOTE — ED Provider Notes (Signed)
Medical screening examination/treatment/procedure(s) were performed by non-physician practitioner and as supervising physician I was immediately available for consultation/collaboration.  Normalee Sistare, MD 03/17/13 1508 

## 2013-03-18 ENCOUNTER — Emergency Department (HOSPITAL_COMMUNITY)
Admission: EM | Admit: 2013-03-18 | Discharge: 2013-03-18 | Disposition: A | Discharge status: Home / Self Care | Payer: Medicare Other | Attending: Emergency Medicine | Admitting: Emergency Medicine

## 2013-03-18 ENCOUNTER — Encounter (HOSPITAL_COMMUNITY): Payer: Self-pay | Admitting: *Deleted

## 2013-03-18 DIAGNOSIS — Z8701 Personal history of pneumonia (recurrent): Secondary | ICD-10-CM | POA: Insufficient documentation

## 2013-03-18 DIAGNOSIS — Z992 Dependence on renal dialysis: Secondary | ICD-10-CM | POA: Insufficient documentation

## 2013-03-18 DIAGNOSIS — Y9389 Activity, other specified: Secondary | ICD-10-CM | POA: Insufficient documentation

## 2013-03-18 DIAGNOSIS — Z9861 Coronary angioplasty status: Secondary | ICD-10-CM | POA: Insufficient documentation

## 2013-03-18 DIAGNOSIS — X500XXA Overexertion from strenuous movement or load, initial encounter: Secondary | ICD-10-CM | POA: Insufficient documentation

## 2013-03-18 DIAGNOSIS — Z79899 Other long term (current) drug therapy: Secondary | ICD-10-CM | POA: Insufficient documentation

## 2013-03-18 DIAGNOSIS — G894 Chronic pain syndrome: Secondary | ICD-10-CM | POA: Insufficient documentation

## 2013-03-18 DIAGNOSIS — N186 End stage renal disease: Secondary | ICD-10-CM | POA: Insufficient documentation

## 2013-03-18 DIAGNOSIS — J449 Chronic obstructive pulmonary disease, unspecified: Secondary | ICD-10-CM | POA: Insufficient documentation

## 2013-03-18 DIAGNOSIS — I120 Hypertensive chronic kidney disease with stage 5 chronic kidney disease or end stage renal disease: Secondary | ICD-10-CM | POA: Insufficient documentation

## 2013-03-18 DIAGNOSIS — F319 Bipolar disorder, unspecified: Secondary | ICD-10-CM | POA: Insufficient documentation

## 2013-03-18 DIAGNOSIS — Z7982 Long term (current) use of aspirin: Secondary | ICD-10-CM | POA: Insufficient documentation

## 2013-03-18 DIAGNOSIS — G8929 Other chronic pain: Secondary | ICD-10-CM | POA: Insufficient documentation

## 2013-03-18 DIAGNOSIS — S61511A Laceration without foreign body of right wrist, initial encounter: Secondary | ICD-10-CM

## 2013-03-18 DIAGNOSIS — Z23 Encounter for immunization: Secondary | ICD-10-CM | POA: Insufficient documentation

## 2013-03-18 DIAGNOSIS — J4489 Other specified chronic obstructive pulmonary disease: Secondary | ICD-10-CM | POA: Insufficient documentation

## 2013-03-18 DIAGNOSIS — S61509A Unspecified open wound of unspecified wrist, initial encounter: Secondary | ICD-10-CM | POA: Insufficient documentation

## 2013-03-18 DIAGNOSIS — M549 Dorsalgia, unspecified: Secondary | ICD-10-CM

## 2013-03-18 DIAGNOSIS — F209 Schizophrenia, unspecified: Secondary | ICD-10-CM | POA: Insufficient documentation

## 2013-03-18 DIAGNOSIS — IMO0002 Reserved for concepts with insufficient information to code with codable children: Secondary | ICD-10-CM | POA: Insufficient documentation

## 2013-03-18 DIAGNOSIS — Y92009 Unspecified place in unspecified non-institutional (private) residence as the place of occurrence of the external cause: Secondary | ICD-10-CM | POA: Insufficient documentation

## 2013-03-18 DIAGNOSIS — Z8679 Personal history of other diseases of the circulatory system: Secondary | ICD-10-CM | POA: Insufficient documentation

## 2013-03-18 DIAGNOSIS — F17200 Nicotine dependence, unspecified, uncomplicated: Secondary | ICD-10-CM | POA: Insufficient documentation

## 2013-03-18 DIAGNOSIS — W268XXA Contact with other sharp object(s), not elsewhere classified, initial encounter: Secondary | ICD-10-CM | POA: Insufficient documentation

## 2013-03-18 DIAGNOSIS — W010XXA Fall on same level from slipping, tripping and stumbling without subsequent striking against object, initial encounter: Secondary | ICD-10-CM | POA: Insufficient documentation

## 2013-03-18 DIAGNOSIS — G43909 Migraine, unspecified, not intractable, without status migrainosus: Secondary | ICD-10-CM | POA: Insufficient documentation

## 2013-03-18 IMAGING — CR DG CHEST 2V
2 series · 2 of 2 positions shown · non-contrast
Comparison: Chest radiograph performed 09/01/2011

CLINICAL DATA: Cough and severe shortness of breath.

CHEST - 2 VIEW

[view not recorded (1 of 2)]
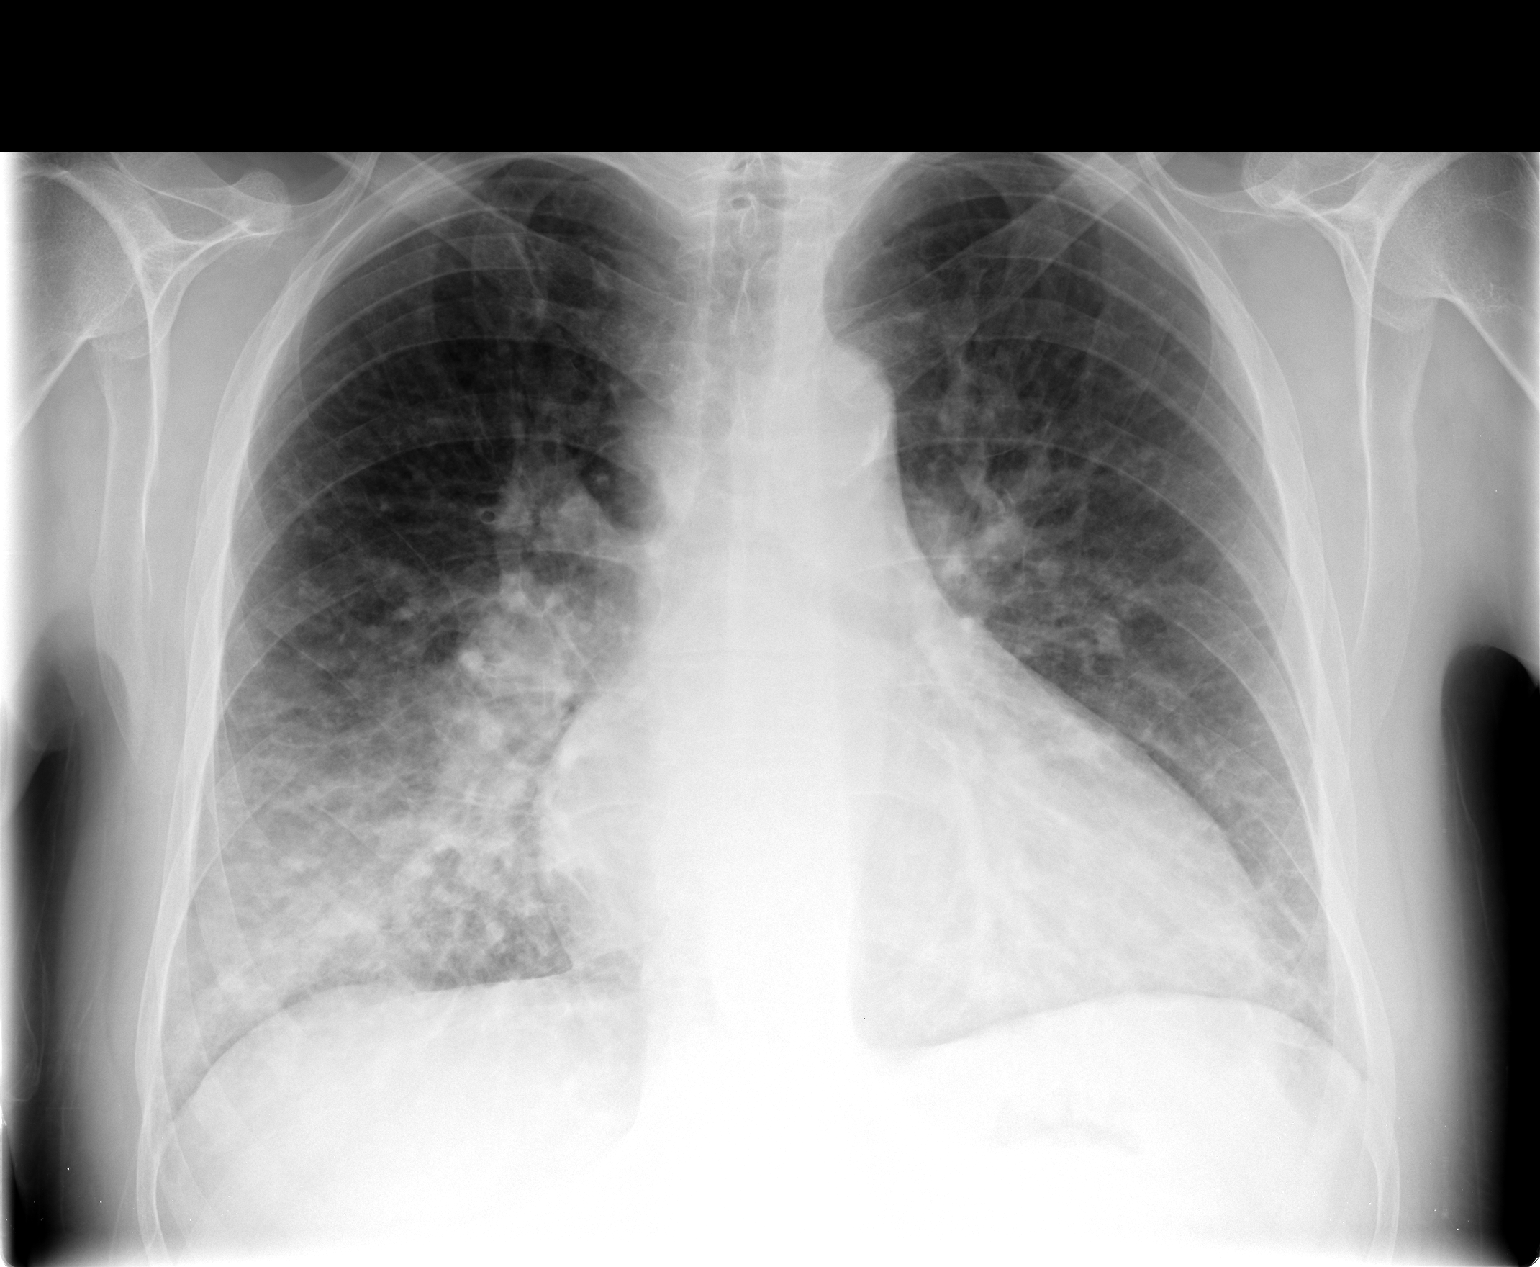

[view not recorded (2 of 2)]
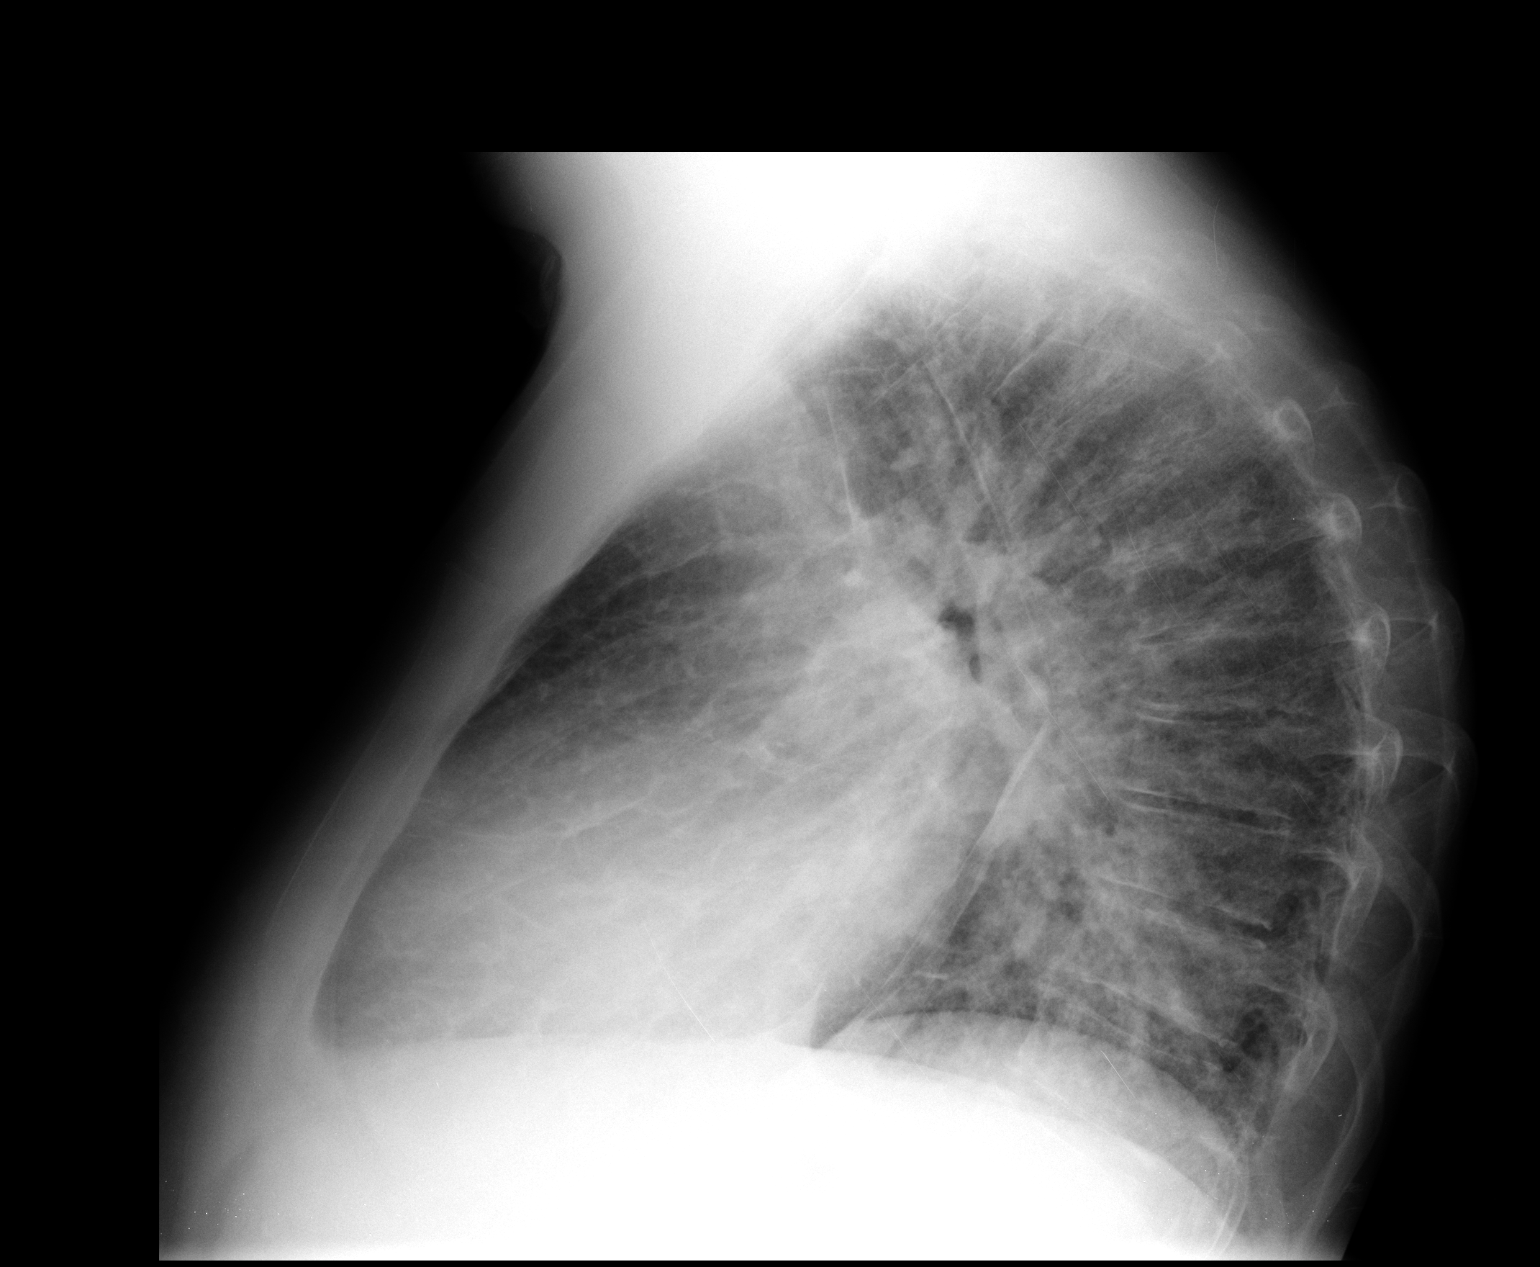

[2 of 2 positions shown; findings below may reference images not displayed]

FINDINGS: The lungs remain well expanded.  Right basilar airspace
opacification has mildly improved, likely reflecting residual
pneumonia.  Underlying vascular congestion is noted, with centrally
increased lung markings, likely reflecting mildly worsening
pulmonary edema.  No pleural effusion or pneumothorax is seen.

The heart is mildly enlarged; calcification is noted within the
aortic arch.  No acute osseous abnormalities are seen.
IMPRESSION: 1.  Mild interval improvement in right basilar airspace
opacification, likely reflecting improving pneumonia.
2.  Suspicion of mild interval worsening in underlying pulmonary
edema, with associated vascular congestion and mild cardiomegaly.

## 2013-03-18 MED ORDER — HYDROMORPHONE HCL PF 2 MG/ML IJ SOLN
1.0000 mg | Freq: Once | INTRAMUSCULAR | Status: AC
  Administered 2013-03-18: 1 mg via INTRAMUSCULAR
  Filled 2013-03-18: qty 1

## 2013-03-18 MED ORDER — TETANUS-DIPHTH-ACELL PERTUSSIS 5-2.5-18.5 LF-MCG/0.5 IM SUSP
0.5000 mL | Freq: Once | INTRAMUSCULAR | Status: AC
  Administered 2013-03-18: 0.5 mL via INTRAMUSCULAR
  Filled 2013-03-18 (×2): qty 0.5

## 2013-03-18 NOTE — ED Notes (Signed)
Pt placed into exam room, walked out to desk and asked for cup of ice, ice given. Pt walked unaided back to exam room.

## 2013-03-18 NOTE — ED Notes (Signed)
Pt wants to be seen for back pain, but would also like to be seen for small laceration to right wrist area. He stated that he fell over his dog. And cut  Himself on razor knife. Bleeding controlled w/ band-aid.

## 2013-03-18 NOTE — ED Notes (Signed)
Low back pain after fall 1 hour ago,  Superficial lac to rt wrist, accidentally cut himself with razor.
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
## 2013-03-20 IMAGING — CR DG CHEST 2V
2 series · 2 of 2 positions shown · non-contrast
Comparison: 09/03/2011, 09/01/2011

CLINICAL DATA: Bilateral flank pain

CHEST - 2 VIEW

[view not recorded (1 of 2)]
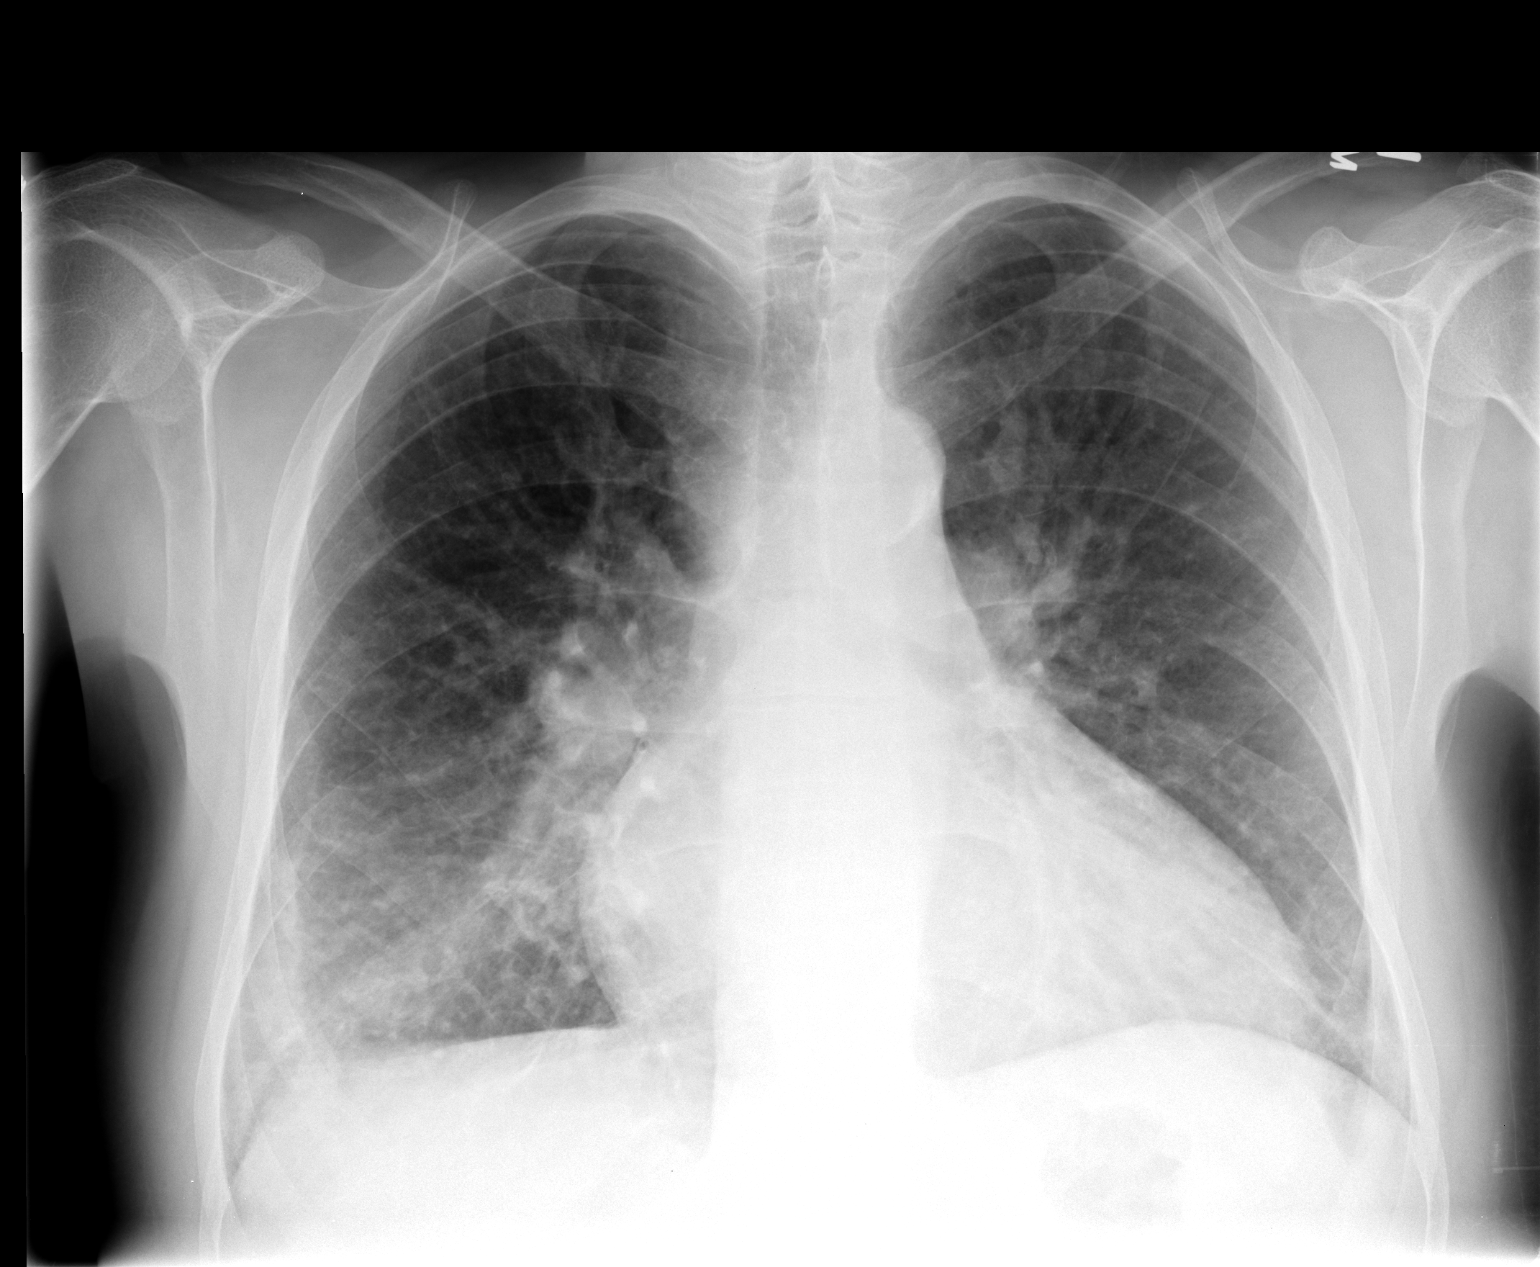

[view not recorded (2 of 2)]
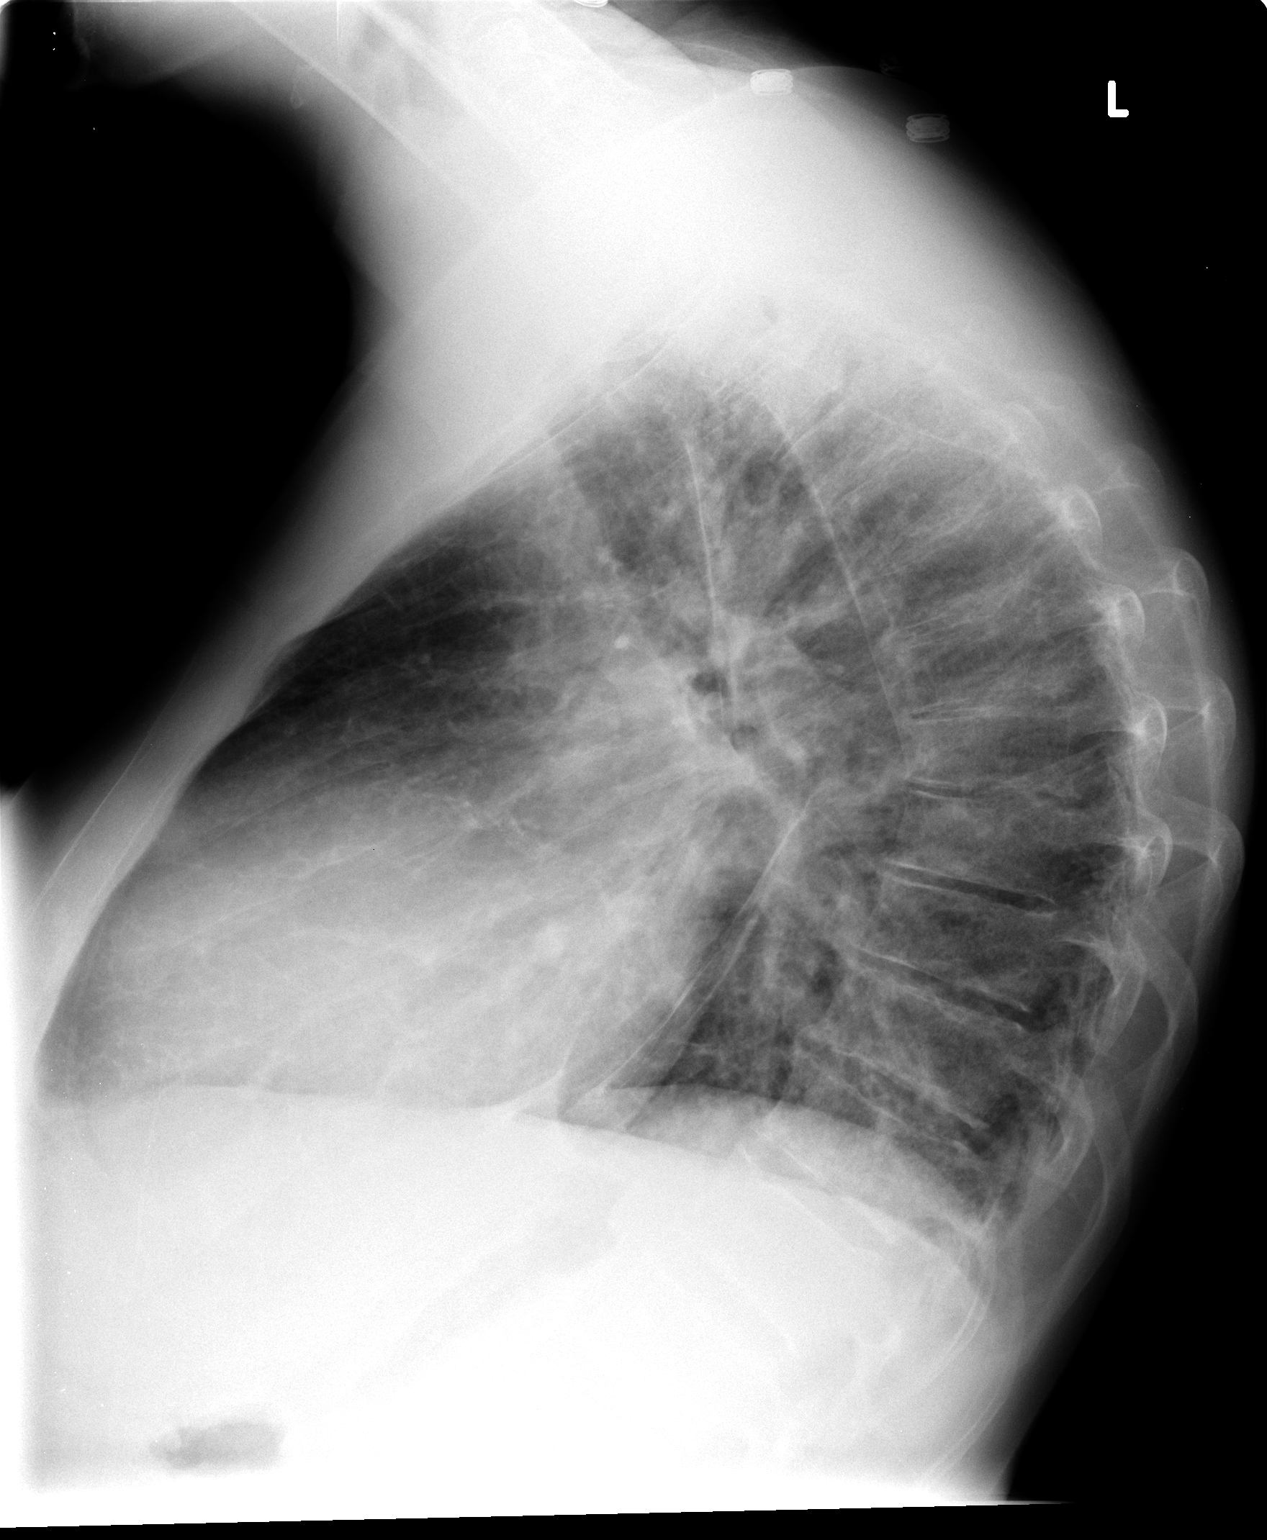

[2 of 2 positions shown; findings below may reference images not displayed]

FINDINGS: Enlargement of cardiac silhouette.
Pulmonary vascular congestion.
Further decrease in right basilar infiltrate consistent with
improving pneumonia.
Markings in lower left lung also slightly improved.
No new areas of infiltrate, pleural effusion or pneumothorax.
Atherosclerotic calcification aorta.
No acute osseous findings.
IMPRESSION: Further improvement in right basilar pneumonia.

## 2013-03-20 NOTE — ED Provider Notes (Signed)
Medical screening examination/treatment/procedure(s) were performed by non-physician practitioner and as supervising physician I was immediately available for consultation/collaboration.   Marke Goodwyn M Catherine Oak, DO 03/20/13 1638 

## 2013-03-21 ENCOUNTER — Inpatient Hospital Stay (HOSPITAL_COMMUNITY)
Admission: EM | Admit: 2013-03-21 | Discharge: 2013-03-23 | DRG: 291 | Disposition: A | Discharge status: Home / Self Care | Payer: Medicare Other | Attending: Family Medicine | Admitting: Family Medicine

## 2013-03-21 ENCOUNTER — Emergency Department (HOSPITAL_COMMUNITY): Payer: Medicare Other

## 2013-03-21 ENCOUNTER — Encounter (HOSPITAL_COMMUNITY): Payer: Self-pay | Admitting: *Deleted

## 2013-03-21 DIAGNOSIS — Z809 Family history of malignant neoplasm, unspecified: Secondary | ICD-10-CM

## 2013-03-21 DIAGNOSIS — Z9115 Patient's noncompliance with renal dialysis: Secondary | ICD-10-CM

## 2013-03-21 DIAGNOSIS — Z91158 Patient's noncompliance with renal dialysis for other reason: Secondary | ICD-10-CM

## 2013-03-21 DIAGNOSIS — Z9861 Coronary angioplasty status: Secondary | ICD-10-CM

## 2013-03-21 DIAGNOSIS — Z720 Tobacco use: Secondary | ICD-10-CM

## 2013-03-21 DIAGNOSIS — F17200 Nicotine dependence, unspecified, uncomplicated: Secondary | ICD-10-CM | POA: Diagnosis present

## 2013-03-21 DIAGNOSIS — F319 Bipolar disorder, unspecified: Secondary | ICD-10-CM | POA: Diagnosis present

## 2013-03-21 DIAGNOSIS — J4489 Other specified chronic obstructive pulmonary disease: Secondary | ICD-10-CM | POA: Diagnosis present

## 2013-03-21 DIAGNOSIS — G43909 Migraine, unspecified, not intractable, without status migrainosus: Secondary | ICD-10-CM | POA: Diagnosis present

## 2013-03-21 DIAGNOSIS — I2589 Other forms of chronic ischemic heart disease: Secondary | ICD-10-CM | POA: Diagnosis present

## 2013-03-21 DIAGNOSIS — Z992 Dependence on renal dialysis: Secondary | ICD-10-CM

## 2013-03-21 DIAGNOSIS — K219 Gastro-esophageal reflux disease without esophagitis: Secondary | ICD-10-CM | POA: Diagnosis present

## 2013-03-21 DIAGNOSIS — Z833 Family history of diabetes mellitus: Secondary | ICD-10-CM

## 2013-03-21 DIAGNOSIS — G894 Chronic pain syndrome: Secondary | ICD-10-CM | POA: Diagnosis present

## 2013-03-21 DIAGNOSIS — D649 Anemia, unspecified: Secondary | ICD-10-CM | POA: Diagnosis present

## 2013-03-21 DIAGNOSIS — I255 Ischemic cardiomyopathy: Secondary | ICD-10-CM

## 2013-03-21 DIAGNOSIS — J811 Chronic pulmonary edema: Secondary | ICD-10-CM

## 2013-03-21 DIAGNOSIS — Z79899 Other long term (current) drug therapy: Secondary | ICD-10-CM

## 2013-03-21 DIAGNOSIS — Z23 Encounter for immunization: Secondary | ICD-10-CM

## 2013-03-21 DIAGNOSIS — Z8249 Family history of ischemic heart disease and other diseases of the circulatory system: Secondary | ICD-10-CM

## 2013-03-21 DIAGNOSIS — R079 Chest pain, unspecified: Secondary | ICD-10-CM

## 2013-03-21 DIAGNOSIS — Z8701 Personal history of pneumonia (recurrent): Secondary | ICD-10-CM

## 2013-03-21 DIAGNOSIS — E785 Hyperlipidemia, unspecified: Secondary | ICD-10-CM | POA: Diagnosis present

## 2013-03-21 DIAGNOSIS — N186 End stage renal disease: Secondary | ICD-10-CM | POA: Diagnosis present

## 2013-03-21 DIAGNOSIS — I509 Heart failure, unspecified: Principal | ICD-10-CM | POA: Diagnosis present

## 2013-03-21 DIAGNOSIS — I120 Hypertensive chronic kidney disease with stage 5 chronic kidney disease or end stage renal disease: Secondary | ICD-10-CM | POA: Diagnosis present

## 2013-03-21 DIAGNOSIS — J449 Chronic obstructive pulmonary disease, unspecified: Secondary | ICD-10-CM

## 2013-03-21 DIAGNOSIS — R0902 Hypoxemia: Secondary | ICD-10-CM | POA: Diagnosis present

## 2013-03-21 DIAGNOSIS — F209 Schizophrenia, unspecified: Secondary | ICD-10-CM | POA: Diagnosis present

## 2013-03-21 DIAGNOSIS — I10 Essential (primary) hypertension: Secondary | ICD-10-CM | POA: Diagnosis present

## 2013-03-21 HISTORY — DX: Unspecified asthma, uncomplicated: J45.909

## 2013-03-21 LAB — BLOOD GAS, ARTERIAL
Acid-Base Excess: 2.8 mmol/L — ABNORMAL HIGH (ref 0.0–2.0)
Bicarbonate: 26.1 mEq/L — ABNORMAL HIGH (ref 20.0–24.0)
TCO2: 24.4 mmol/L (ref 0–100)
pCO2 arterial: 35.5 mmHg (ref 35.0–45.0)

## 2013-03-21 LAB — BASIC METABOLIC PANEL
BUN: 29 mg/dL — ABNORMAL HIGH (ref 6–23)
Chloride: 92 mEq/L — ABNORMAL LOW (ref 96–112)
Creatinine, Ser: 4.56 mg/dL — ABNORMAL HIGH (ref 0.50–1.35)
GFR calc Af Amer: 17 mL/min — ABNORMAL LOW (ref >90)

## 2013-03-21 LAB — CBC WITH DIFFERENTIAL/PLATELET
Basophils Relative: 0 % (ref 0–1)
Eosinophils Absolute: 0.2 10*3/uL (ref 0.0–0.7)
HCT: 27 % — ABNORMAL LOW (ref 39.0–52.0)
Hemoglobin: 8.9 g/dL — ABNORMAL LOW (ref 13.0–17.0)
MCH: 31.2 pg (ref 26.0–34.0)
MCHC: 33 g/dL (ref 30.0–36.0)
Monocytes Absolute: 0.4 10*3/uL (ref 0.1–1.0)
Monocytes Relative: 7 % (ref 3–12)

## 2013-03-21 LAB — LACTIC ACID, PLASMA: Lactic Acid, Venous: 0.8 mmol/L (ref 0.5–2.2)

## 2013-03-21 LAB — PRO B NATRIURETIC PEPTIDE: Pro B Natriuretic peptide (BNP): >70000 pg/mL — ABNORMAL HIGH (ref 0–125)

## 2013-03-21 MED ORDER — LABETALOL HCL 200 MG PO TABS
800.0000 mg | ORAL_TABLET | Freq: Two times a day (BID) | ORAL | Status: DC
  Administered 2013-03-22 – 2013-03-23 (×3): 800 mg via ORAL
  Filled 2013-03-21 (×4): qty 4

## 2013-03-21 MED ORDER — HYDROXYZINE HCL 25 MG PO TABS
25.0000 mg | ORAL_TABLET | Freq: Two times a day (BID) | ORAL | Status: DC
  Administered 2013-03-22 – 2013-03-23 (×4): 25 mg via ORAL
  Filled 2013-03-21 (×4): qty 1

## 2013-03-21 MED ORDER — OLANZAPINE 5 MG PO TABS
10.0000 mg | ORAL_TABLET | Freq: Two times a day (BID) | ORAL | Status: DC
  Administered 2013-03-22 – 2013-03-23 (×4): 10 mg via ORAL
  Filled 2013-03-21 (×4): qty 2

## 2013-03-21 MED ORDER — HYDRALAZINE HCL 25 MG PO TABS
50.0000 mg | ORAL_TABLET | Freq: Three times a day (TID) | ORAL | Status: DC
  Administered 2013-03-22 – 2013-03-23 (×4): 50 mg via ORAL
  Filled 2013-03-21 (×5): qty 2

## 2013-03-21 MED ORDER — SEVELAMER CARBONATE 800 MG PO TABS
2400.0000 mg | ORAL_TABLET | Freq: Two times a day (BID) | ORAL | Status: DC | PRN
  Filled 2013-03-21: qty 3

## 2013-03-21 MED ORDER — CARVEDILOL 12.5 MG PO TABS
12.5000 mg | ORAL_TABLET | Freq: Two times a day (BID) | ORAL | Status: DC
  Administered 2013-03-22 – 2013-03-23 (×2): 12.5 mg via ORAL
  Filled 2013-03-21 (×3): qty 1

## 2013-03-21 MED ORDER — ACETAMINOPHEN 500 MG PO TABS
1000.0000 mg | ORAL_TABLET | Freq: Once | ORAL | Status: DC
  Filled 2013-03-21: qty 2

## 2013-03-21 MED ORDER — OXYCODONE-ACETAMINOPHEN 5-325 MG PO TABS
1.0000 | ORAL_TABLET | ORAL | Status: DC | PRN
  Administered 2013-03-22 (×4): 1 via ORAL
  Filled 2013-03-21 (×4): qty 1

## 2013-03-21 MED ORDER — CLONIDINE HCL 0.2 MG PO TABS
0.2000 mg | ORAL_TABLET | Freq: Two times a day (BID) | ORAL | Status: DC
  Administered 2013-03-22: 0.2 mg via ORAL
  Filled 2013-03-21: qty 1

## 2013-03-21 MED ORDER — LISINOPRIL 10 MG PO TABS
20.0000 mg | ORAL_TABLET | Freq: Two times a day (BID) | ORAL | Status: DC
  Administered 2013-03-22 – 2013-03-23 (×3): 20 mg via ORAL
  Filled 2013-03-21 (×5): qty 2

## 2013-03-21 MED ORDER — DULOXETINE HCL 60 MG PO CPEP
60.0000 mg | ORAL_CAPSULE | Freq: Every day | ORAL | Status: DC
  Administered 2013-03-22 – 2013-03-23 (×2): 60 mg via ORAL
  Filled 2013-03-21 (×3): qty 1

## 2013-03-21 MED ORDER — PANTOPRAZOLE SODIUM 20 MG PO TBEC
20.0000 mg | DELAYED_RELEASE_TABLET | Freq: Every day | ORAL | Status: DC
  Filled 2013-03-21: qty 1

## 2013-03-21 MED ORDER — ROPINIROLE HCL 1 MG PO TABS
1.0000 mg | ORAL_TABLET | Freq: Every day | ORAL | Status: DC
  Administered 2013-03-22: 1 mg via ORAL
  Filled 2013-03-21 (×4): qty 1

## 2013-03-21 MED ORDER — ONDANSETRON HCL 4 MG/2ML IJ SOLN: 4.0000 mg | Freq: Three times a day (TID) | INTRAMUSCULAR | Status: AC | PRN

## 2013-03-21 MED ORDER — ENOXAPARIN SODIUM 30 MG/0.3ML ~~LOC~~ SOLN: 40.0000 mg | SUBCUTANEOUS | Status: DC

## 2013-03-21 MED ORDER — ZOLPIDEM TARTRATE 5 MG PO TABS
10.0000 mg | ORAL_TABLET | Freq: Every evening | ORAL | Status: DC | PRN
  Administered 2013-03-22 (×2): 10 mg via ORAL
  Filled 2013-03-21: qty 1
  Filled 2013-03-21 (×2): qty 2

## 2013-03-21 MED ORDER — AMLODIPINE BESYLATE 5 MG PO TABS
10.0000 mg | ORAL_TABLET | Freq: Every day | ORAL | Status: DC
  Administered 2013-03-22 – 2013-03-23 (×2): 10 mg via ORAL
  Filled 2013-03-21 (×2): qty 2
  Filled 2013-03-21: qty 1

## 2013-03-21 MED ORDER — ASPIRIN 81 MG PO CHEW
324.0000 mg | CHEWABLE_TABLET | Freq: Once | ORAL | Status: AC
  Administered 2013-03-21: 324 mg via ORAL
  Filled 2013-03-21: qty 4

## 2013-03-21 NOTE — ED Notes (Signed)
Dr Renard Matter in to speak with patient

## 2013-03-21 NOTE — ED Notes (Signed)
Onset 3 pm with chest pain and sob,  Had dialysis today.

## 2013-03-21 NOTE — ED Notes (Signed)
Requesting medication for chest pain - specifically "percocet"  States that is what he takes at home. Advised have order for Tylenol- declined- states he has taken about 8 already today.  Encouraged to sit on stretcher to elevate legs, states he has tried.  Can't breathe if legs on bed - leaning forward in sitting position.

## 2013-03-21 NOTE — ED Notes (Signed)
To room to dc patient AMA and he now states he has changed his mind and wants to stay. Dr. Hyacinth Meeker notified and we went to room together to speak with patient.  Patient is aware that he will not be given narcotic pain meds and he still wants to stay.  States he cannot breathe.
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
## 2013-03-22 ENCOUNTER — Encounter (HOSPITAL_COMMUNITY): Payer: Self-pay | Admitting: General Practice

## 2013-03-22 DIAGNOSIS — R079 Chest pain, unspecified: Secondary | ICD-10-CM

## 2013-03-22 DIAGNOSIS — I2589 Other forms of chronic ischemic heart disease: Secondary | ICD-10-CM

## 2013-03-22 DIAGNOSIS — I059 Rheumatic mitral valve disease, unspecified: Secondary | ICD-10-CM

## 2013-03-22 LAB — CBC WITH DIFFERENTIAL/PLATELET
Basophils Relative: 0 % (ref 0–1)
Eosinophils Absolute: 0.1 10*3/uL (ref 0.0–0.7)
Eosinophils Relative: 2 % (ref 0–5)
Lymphs Abs: 1.1 10*3/uL (ref 0.7–4.0)
MCH: 30.6 pg (ref 26.0–34.0)
MCHC: 32.5 g/dL (ref 30.0–36.0)
MCV: 94 fL (ref 78.0–100.0)
Platelets: 179 10*3/uL (ref 150–400)
RDW: 16.9 % — ABNORMAL HIGH (ref 11.5–15.5)

## 2013-03-22 LAB — COMPREHENSIVE METABOLIC PANEL
ALT: 27 U/L (ref 0–53)
Albumin: 3.4 g/dL — ABNORMAL LOW (ref 3.5–5.2)
Calcium: 10 mg/dL (ref 8.4–10.5)
GFR calc Af Amer: 15 mL/min — ABNORMAL LOW (ref >90)
Glucose, Bld: 105 mg/dL — ABNORMAL HIGH (ref 70–99)
Sodium: 136 mEq/L (ref 135–145)
Total Protein: 6.8 g/dL (ref 6.0–8.3)

## 2013-03-22 LAB — TROPONIN I
Troponin I: <0.3 ng/mL (ref <0.30)
Troponin I: <0.3 ng/mL (ref <0.30)

## 2013-03-22 LAB — MRSA PCR SCREENING: MRSA by PCR: NEGATIVE

## 2013-03-22 MED ORDER — ALTEPLASE 2 MG IJ SOLR
2.0000 mg | Freq: Once | INTRAMUSCULAR | Status: DC | PRN
  Filled 2013-03-22: qty 2

## 2013-03-22 MED ORDER — HEPARIN SODIUM (PORCINE) 1000 UNIT/ML DIALYSIS
300.0000 [IU] | INTRAMUSCULAR | Status: DC | PRN
  Filled 2013-03-22: qty 1

## 2013-03-22 MED ORDER — BIOTENE DRY MOUTH MT LIQD
15.0000 mL | Freq: Two times a day (BID) | OROMUCOSAL | Status: DC
  Administered 2013-03-22 – 2013-03-23 (×2): 15 mL via OROMUCOSAL

## 2013-03-22 MED ORDER — SODIUM CHLORIDE 0.9 % IV SOLN: 100.0000 mL | INTRAVENOUS | Status: DC | PRN

## 2013-03-22 MED ORDER — ISOSORBIDE MONONITRATE ER 60 MG PO TB24
30.0000 mg | ORAL_TABLET | Freq: Every day | ORAL | Status: DC
  Administered 2013-03-22 – 2013-03-23 (×2): 30 mg via ORAL
  Filled 2013-03-22 (×2): qty 1
  Filled 2013-03-22: qty 2

## 2013-03-22 MED ORDER — ENOXAPARIN SODIUM 30 MG/0.3ML ~~LOC~~ SOLN
30.0000 mg | Freq: Every day | SUBCUTANEOUS | Status: DC
  Administered 2013-03-22: 30 mg via SUBCUTANEOUS
  Filled 2013-03-22 (×2): qty 0.3

## 2013-03-22 MED ORDER — LIDOCAINE HCL (PF) 1 % IJ SOLN
5.0000 mL | INTRAMUSCULAR | Status: DC | PRN
  Filled 2013-03-22: qty 5

## 2013-03-22 MED ORDER — NEPRO/CARBSTEADY PO LIQD
237.0000 mL | ORAL | Status: DC | PRN
  Filled 2013-03-22: qty 237

## 2013-03-22 MED ORDER — HEPARIN SODIUM (PORCINE) 1000 UNIT/ML DIALYSIS
20.0000 [IU]/kg | INTRAMUSCULAR | Status: DC | PRN
  Filled 2013-03-22: qty 2

## 2013-03-22 MED ORDER — OXYCODONE-ACETAMINOPHEN 5-325 MG PO TABS
2.0000 | ORAL_TABLET | ORAL | Status: DC | PRN
  Administered 2013-03-22 – 2013-03-23 (×4): 2 via ORAL
  Filled 2013-03-22 (×6): qty 2

## 2013-03-22 MED ORDER — ALBUTEROL SULFATE (5 MG/ML) 0.5% IN NEBU
INHALATION_SOLUTION | RESPIRATORY_TRACT | Status: AC
  Administered 2013-03-22: 2.5 mg
  Filled 2013-03-22: qty 0.5

## 2013-03-22 MED ORDER — PENTAFLUOROPROP-TETRAFLUOROETH EX AERO
1.0000 "application " | INHALATION_SPRAY | CUTANEOUS | Status: DC | PRN
  Filled 2013-03-22: qty 103.5

## 2013-03-22 MED ORDER — CLONIDINE HCL 0.1 MG PO TABS
0.3000 mg | ORAL_TABLET | Freq: Two times a day (BID) | ORAL | Status: DC
  Administered 2013-03-22 – 2013-03-23 (×3): 0.3 mg via ORAL
  Filled 2013-03-22 (×4): qty 1

## 2013-03-22 MED ORDER — HEPARIN SODIUM (PORCINE) 1000 UNIT/ML DIALYSIS
1000.0000 [IU] | INTRAMUSCULAR | Status: DC | PRN
  Filled 2013-03-22: qty 1

## 2013-03-22 MED ORDER — PNEUMOCOCCAL VAC POLYVALENT 25 MCG/0.5ML IJ INJ
0.5000 mL | INJECTION | INTRAMUSCULAR | Status: AC
  Administered 2013-03-23: 0.5 mL via INTRAMUSCULAR
  Filled 2013-03-22: qty 0.5

## 2013-03-22 MED ORDER — LIDOCAINE-PRILOCAINE 2.5-2.5 % EX CREA: 1.0000 "application " | TOPICAL_CREAM | CUTANEOUS | Status: DC | PRN

## 2013-03-22 MED ORDER — PANTOPRAZOLE SODIUM 40 MG PO TBEC
40.0000 mg | DELAYED_RELEASE_TABLET | Freq: Every day | ORAL | Status: DC
  Administered 2013-03-22 – 2013-03-23 (×2): 40 mg via ORAL
  Filled 2013-03-22 (×3): qty 1

## 2013-03-22 MED ORDER — ALBUTEROL SULFATE (5 MG/ML) 0.5% IN NEBU
2.5000 mg | INHALATION_SOLUTION | Freq: Four times a day (QID) | RESPIRATORY_TRACT | Status: DC | PRN
  Administered 2013-03-23: 2.5 mg via RESPIRATORY_TRACT
  Filled 2013-03-22: qty 0.5

## 2013-03-22 NOTE — H&P (Signed)
[id number] 

## 2013-03-22 NOTE — Progress Notes (Signed)
UR Chart Review Completed  

## 2013-03-22 NOTE — Progress Notes (Signed)
Alerted by central tele that patienthad 8 beat run V tach.  Patient in dialysis, called dialysis nurse.  Currently patient is sleeping, no changes or symptoms noted.  Leads placed back correctly.  Patient now in NSR.  Will continue to monitor

## 2013-03-22 NOTE — H&P (Signed)
NAMEHILLIS, MCPHATTER NO.:  [id number]  MEDICAL RECORD NO.:  [id number]  LOCATION:  A312                          FACILITY:  [facility]  PHYSICIAN:  Melvyn Novas, MDDATE OF BIRTH:  [birth year]  DATE OF ADMISSION:  03/21/2013 DATE OF DISCHARGE:  LH                             HISTORY & PHYSICAL   HISTORY OF PRESENT ILLNESS:  The patient is a 37 year old white male with leading medical issues two-vessel coronary artery disease; ischemic cardiomyopathy, EF 45%; end-stage renal disease, on dialysis; severe COPD; continued tobacco abuse; accelerated hypertension; hyperlipidemia; schizophrenia who apparently had dialysis yesterday and then went home and reported back to the ER with a retrosternal burning sensation.  He said he felt like his lungs were burning.  He specifically denied anginal type chest pain.  Troponins were negative.  He was placed in the hospital.  His chest x-ray was consistent with acute-on-chronic volume overload, and his systolics were about 160, which is customary for him. The patient has denied orthopnea, PND.  Denies exertional anginal chest pain.  He does have pleuritic component to his pain and continues to smoke a pack to pack and half per day.  Suspicion is for bronchitis, GERD, or pleuritis has etiology.  However, we will do cardiac enzymes and obtain Cardiology consultation to discern possible ischemic causes for chest discomfort.  PAST MEDICAL HISTORY:  Significant for end-stage renal disease, schizophrenia, bipolar disorder, depression, severe COPD, two-vessel coronary artery disease, hyperlipidemia, chronic renal failure, chronic noncompliance.  PAST SURGICAL HISTORY:  Remarkable for stent to RCA in 2010 and circumflex also.  He has had an EGD for esophageal erosions, GERD, and hiatal hernia, and he has had the fistula placed in his left arm.  CURRENT MEDICINES: 1. Norvasc 10 mg p.o. daily. 2. Aspirin 81 mg p.o. daily. 3.  Carvedilol 12.5 mg p.o. b.i.d. 4. Clonidine 0.2 mg p.o. b.i.d. 5. Cymbalta 60 mg p.o. daily. 6. Hydralazine 50 mg p.o. t.i.d. 7. Hydroxyzine 25 mg b.i.d. for anxiety. 8. Labetalol 800 mg p.o. b.i.d. 9. Prevacid 30 mg p.o. daily. 10.Lisinopril 20 mg p.o. daily. 11.Zyprexa 10 mg p.o. b.i.d. 12.Pravastatin 40 mg p.o. daily. 13.ReQuip 1 mg p.o. at bedtime. 14.Renvela 800 mg 3 tablets b.i.d. 15.Topamax 50 mg p.o. at bedtime. 16.Ambien 10 mg p.o. at bedtime.  PHYSICAL EXAMINATION:  VITAL SIGNS:  Blood pressure is 161/85, pulse is 87 and regular, temperature 98.2, respiratory rate is 21, O2 saturation 96%. GENERAL:  The patient has accessory use of muscles of respiration noted in the sternocleidomastoid. EYES:  PERRLA intact.  Sclerae clear.  Conjunctivae pink. NECK:  No JVD.  No carotid bruits.  No thyromegaly.  No thyroid bruits. LUNGS:  Kyphosis with diminished breath sounds at the bases.  Scattered rhonchi.  No rales audible. HEART:  Regular rhythm.  No S3, S4.  No heaves, thrills, or rubs. ABDOMEN:  Soft, nontender.  Bowel sounds normoactive.  No guarding or rebound. EXTREMITIES:  Trace to 1+ chronic pedal edema. NEUROLOGIC:  Cranial nerves III through XII grossly intact.  The patient moves all 4 extremities.  IMPRESSION: 1. Atypical burning type chest pain, possible pleuritis, possible     bronchitis. 2. Underlying two-vessel  coronary disease, status post stenting to     right coronary artery and circumflex. 3. Volume overload per chest x-ray.  The patient has ischemic     cardiomyopathy, accelerated hypertension. 4. Accelerated hypertension. 5. Hyperlipidemia. 6. End-stage renal disease. 7. Hypertension. 8. Schizophrenia. 9. Bipolar disorder. 10.Continued tobacco abuse.  PLAN:  Right now is to increase clonidine to 0.3 mg p.o. b.i.d., serial cardiac enzymes, troponin.  We will obtain Cardiology consultation to see if there is an ischemic burden here underlying multiple  symptoms of chest discomfort and we will make further recommendations as the database expands.     Melvyn Novas, MD     RMD/MEDQ  D:  03/22/2013  T:  03/22/2013  Job:  [id number]

## 2013-03-22 NOTE — Progress Notes (Signed)
Pharmacy Consult: Lovenox for VTE prophylaxis.  Patient Measurements: Height: 5\' 8"  (172.7 cm) Weight: 199 lb 9.6 oz (90.538 kg) IBW/kg (Calculated) : 68.4 Body mass index is 30.36 kg/(m^2).   VITALS Filed Vitals:   03/22/13 0645  BP: 157/92  Pulse:   Temp:   Resp:     INR Last Three Days: Recent Labs     03/21/13  1843  INR  1.18    CBC:    Component Value Date/Time   WBC 4.8 03/22/2013 0536   RBC 2.68* 03/22/2013 0536   HGB 8.2* 03/22/2013 0536   HCT 25.2* 03/22/2013 0536   PLT 179 03/22/2013 0536   MCV 94.0 03/22/2013 0536   MCH 30.6 03/22/2013 0536   MCHC 32.5 03/22/2013 0536   RDW 16.9* 03/22/2013 0536   LYMPHSABS 1.1 03/22/2013 0536   MONOABS 0.4 03/22/2013 0536   EOSABS 0.1 03/22/2013 0536   BASOSABS 0.0 03/22/2013 0536    RENAL FUNCTION: Estimated Creatinine Clearance: 21 ml/min (by C-G formula based on Cr of 5.21).  Assessment: Dose stable for age, weight, renal function and indication.  Plan: Dose adjusted for Estimated Creatinine Clearance: 21 ml/min (by C-G formula based on Cr of 5.21). Sign off.  [name], [facility] 03/22/2013 8:35 AM

## 2013-03-22 NOTE — Progress Notes (Signed)
I have reviewed echo LVEF is approximately 30 to 35%   There is an echodensity in subvalvluar chordal apparatus of posterior mitral leaflet  May represent benign calcification; cannot exclude vegetation (old)  There is also an echodenisity in L atrium  Near septum.  Difficult to see.  Recomm TEE when stablilized. Consider random blood cultures to follow.

## 2013-03-22 NOTE — Progress Notes (Signed)
Nutrition Brief Note  Patient identified on the Malnutrition Screening Tool (MST) Report  Pt known to staff form previous admissions. Weight change likely related to fluid status  due to ESRD with HD and hx of CHF. Mr Savant also has hx of chronic non-compliance with both his diet and HD tx.  Body mass index is 30.36 kg/(m^2). Patient meets criteria for Obesity Class I based on current BMI.   Current diet order is Heart healthy , patient is consuming approximately (data not available)% of meals at this time. Labs and medications reviewed.   No nutrition interventions warranted at this time. If nutrition issues arise, please consult RD.   Royann Shivers MS,RD,LDN,CSG Office: [phone] Pager: [phone]

## 2013-03-22 NOTE — Care Management Note (Unsigned)
    Page 1 of 1   03/22/2013     11:35:55 AM   CARE MANAGEMENT NOTE 03/22/2013  Patient:  [name], [name]   Account Number:  [id number]  Date Initiated:  03/22/2013  Documentation initiated by:  Sharrie Rothman  Subjective/Objective Assessment:   Pt admitted from home with pulmonary edema. Pt lives with his father and will return home at discharge. Pts father takes him to dialysis 3 times a week at [facility]. Pt has O2 with [employer] and neb machine for home use.     Action/Plan:   No CM needs noted.   Anticipated DC Date:  03/27/2013   Anticipated DC Plan:  HOME/SELF CARE      DC Planning Services  CM consult      Choice offered to / List presented to:             Status of service:  Completed, signed off Medicare Important Message given?   (If response is "NO", the following Medicare IM given date fields will be blank) Date Medicare IM given:   Date Additional Medicare IM given:    Discharge Disposition:    Per UR Regulation:    If discussed at Long Length of Stay Meetings, dates discussed:    Comments:  03/22/13 1135 Arlyss Queen, RN BSN CM
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
## 2013-03-22 NOTE — Progress Notes (Signed)
*  PRELIMINARY RESULTS* Echocardiogram 2D Echocardiogram has been performed.  Conrad [place] 03/22/2013, 12:12 PM

## 2013-03-22 NOTE — Procedures (Signed)
4 hour hemodialysis treatment completed through left upper arm AVF (15g needles ante/retrograde).  GOAL MET: Tolerated removal of 4.3 liters (self-primed) with no interruption in ultrafiltration.  "I'm breathing much better now."  O2 @2L , spO2 98%.  C/o chronic and generalized pain.  Primary RN to give Oxycodone prn.  All blood was reinfused. Post BP elevated, 179/104; due for antihypertensives which were held pre-HD.  Pt had a 7-beat run of VT at 1300 (per call received by CMT.  Pt was asymptomatic during this event). Report given to Scotty Court, RN.

## 2013-03-23 LAB — CBC
MCH: 30.7 pg (ref 26.0–34.0)
MCV: 94.9 fL (ref 78.0–100.0)
Platelets: 181 10*3/uL (ref 150–400)
RBC: 2.54 MIL/uL — ABNORMAL LOW (ref 4.22–5.81)
RDW: 16.8 % — ABNORMAL HIGH (ref 11.5–15.5)

## 2013-03-23 LAB — HEPATIC FUNCTION PANEL
ALT: 21 U/L (ref 0–53)
AST: 28 U/L (ref 0–37)
Albumin: 3.1 g/dL — ABNORMAL LOW (ref 3.5–5.2)
Alkaline Phosphatase: 228 U/L — ABNORMAL HIGH (ref 39–117)
Total Bilirubin: 0.7 mg/dL (ref 0.3–1.2)

## 2013-03-23 LAB — BASIC METABOLIC PANEL
CO2: 30 mEq/L (ref 19–32)
Chloride: 95 mEq/L — ABNORMAL LOW (ref 96–112)
Glucose, Bld: 104 mg/dL — ABNORMAL HIGH (ref 70–99)
Potassium: 3.6 mEq/L (ref 3.5–5.1)
Sodium: 138 mEq/L (ref 135–145)

## 2013-03-23 MED ORDER — ISOSORBIDE MONONITRATE ER 30 MG PO TB24: 30.0000 mg | ORAL_TABLET | Freq: Every day | ORAL | Status: AC

## 2013-03-23 NOTE — Progress Notes (Signed)
Client is stable at this time. Client discharged to home. Reviewed discharge instructions and ensured client understanding.

## 2013-03-23 NOTE — Progress Notes (Signed)
Spoke with patient in regards to BP meds, states that he normally takes this medication when he gets home from dialysis and does not take his BP meds twice on those days, patient refused BP medication besides clonidine

## 2013-03-23 NOTE — Progress Notes (Signed)
Subjective: Interval History: has no complaint of nausea or vomiting. Presently he denies any difficulty in breathing. His cough has improved. Patient also does not have any chest pain and appetite is good..  Objective: Vital signs in last 24 hours: Temp:  [97.6 F (36.4 C)-97.8 F (36.6 C)] 97.6 F (36.4 C) (04/05 0500) Pulse Rate:  [63-85] 68 (04/05 0759) Resp:  [20-22] 20 (04/05 0500) BP: (127-179)/(64-104) 160/90 mmHg (04/05 0759) SpO2:  [97 %-100 %] 97 % (04/05 0906) Weight:  [88.4 kg (194 lb 14.2 oz)] 88.4 kg (194 lb 14.2 oz) (04/05 0500) Weight change: -2.138 kg (-4 lb 11.4 oz)  Intake/Output from previous day: 04/04 0701 - 04/05 0700 In: 480 [P.O.:480] Out: 4400  Intake/Output this shift:    General appearance: alert, cooperative and no distress Resp: diminished breath sounds posterior - bilateral and rhonchi posterior - bilateral Cardio: regular rate and rhythm, S1, S2 normal, no murmur, click, rub or gallop GI: soft, non-tender; bowel sounds normal; no masses,  no organomegaly Extremities: edema 1-2+ edema  Lab Results:  Recent Labs  03/22/13 0536 03/23/13 0625  WBC 4.8 5.2  HGB 8.2* 7.8*  HCT 25.2* 24.1*  PLT 179 181   BMET:  Recent Labs  03/22/13 0536 03/23/13 0625  NA 136 138  K 4.6 3.6  CL 93* 95*  CO2 24 30  GLUCOSE 105* 104*  BUN 39* 26*  CREATININE 5.21* 4.00*  CALCIUM 10.0 9.6   No results found for this basename: PTH,  in the last 72 hours Iron Studies: No results found for this basename: IRON, TIBC, TRANSFERRIN, FERRITIN,  in the last 72 hours  Studies/Results: Dg Chest Port 1 View  03/21/2013  *RADIOLOGY REPORT*  Clinical Data: Chest pain.  Shortness of breath.  COPD.  PORTABLE CHEST - 1 VIEW  Comparison: 02/21/2013  Findings: Cardiomegaly stable.  Thoracic kyphosis and low lung volumes again seen.  There is increased diffuse interstitial infiltrates seen bilaterally suspicious for diffuse pulmonary edema.  No evidence of focal  consolidation or significant pleural effusion.  IMPRESSION: Stable cardiomegaly.  Increased diffuse interstitial infiltrates, suspicious for acute pulmonary edema.   Original Report Authenticated By: Myles Rosenthal, M.D.     I have reviewed the patient's current medications.  Assessment/Plan: Problem #1 CHF patient is status post hemodialysis yesterday presently patient seems to be feeling better. Problem #2 COPD is on inhaler. Patient continued to smoke. Problem #3 anemia his with some 7.8 hematocrit 24.1 seems to be declining. Presently patient is on Epogen. Problem #4 end-stage renal disease is BUN is 26 creatinine is 4 with potassium of 3.6.  Problem #5 metabolic bone disease his calcium and phosphorus is was in acceptable range. Problem #6 hypertension blood pressure seems to be reasonably controlled Problem #7 history of bipolar disorder Problem #8 history of GERD. Plan: We'll continue his present management. If patient is going to be here tomorrow we'll evaluate  his condition and make a decision about dialysis. His regular dialysis is Monday ,Wednesday, Friday and if his patient is going to discharged to be followed as an outpatient. Patient advised to cut down his salt and fluid intake and also be compliant with his dialysis time.   LOS: 2 days   Shatina Streets S 03/23/2013,9:27 AM

## 2013-03-23 NOTE — Progress Notes (Signed)
Called and spoke with Dr. Sudie Bailey in regards to patient's BP meds, ordered to give clonidine 0.3mg  now, and recheck BP in a couple of hours.

## 2013-03-23 NOTE — Discharge Summary (Signed)
[id number] 

## 2013-03-24 ENCOUNTER — Encounter (HOSPITAL_COMMUNITY): Payer: Self-pay

## 2013-03-24 ENCOUNTER — Emergency Department (HOSPITAL_COMMUNITY)
Admission: EM | Admit: 2013-03-24 | Discharge: 2013-03-24 | Disposition: A | Discharge status: Home / Self Care | Payer: Medicare Other | Attending: Emergency Medicine | Admitting: Emergency Medicine

## 2013-03-24 ENCOUNTER — Encounter (HOSPITAL_COMMUNITY): Payer: Self-pay | Admitting: *Deleted

## 2013-03-24 DIAGNOSIS — Z8679 Personal history of other diseases of the circulatory system: Secondary | ICD-10-CM | POA: Insufficient documentation

## 2013-03-24 DIAGNOSIS — M25519 Pain in unspecified shoulder: Secondary | ICD-10-CM | POA: Insufficient documentation

## 2013-03-24 DIAGNOSIS — G894 Chronic pain syndrome: Secondary | ICD-10-CM | POA: Insufficient documentation

## 2013-03-24 DIAGNOSIS — IMO0001 Reserved for inherently not codable concepts without codable children: Secondary | ICD-10-CM | POA: Insufficient documentation

## 2013-03-24 DIAGNOSIS — Z862 Personal history of diseases of the blood and blood-forming organs and certain disorders involving the immune mechanism: Secondary | ICD-10-CM | POA: Insufficient documentation

## 2013-03-24 DIAGNOSIS — R51 Headache: Secondary | ICD-10-CM

## 2013-03-24 DIAGNOSIS — Z8701 Personal history of pneumonia (recurrent): Secondary | ICD-10-CM | POA: Insufficient documentation

## 2013-03-24 DIAGNOSIS — I120 Hypertensive chronic kidney disease with stage 5 chronic kidney disease or end stage renal disease: Secondary | ICD-10-CM | POA: Insufficient documentation

## 2013-03-24 DIAGNOSIS — M791 Myalgia, unspecified site: Secondary | ICD-10-CM

## 2013-03-24 DIAGNOSIS — F209 Schizophrenia, unspecified: Secondary | ICD-10-CM | POA: Insufficient documentation

## 2013-03-24 DIAGNOSIS — J449 Chronic obstructive pulmonary disease, unspecified: Secondary | ICD-10-CM | POA: Insufficient documentation

## 2013-03-24 DIAGNOSIS — Z7982 Long term (current) use of aspirin: Secondary | ICD-10-CM | POA: Insufficient documentation

## 2013-03-24 DIAGNOSIS — F319 Bipolar disorder, unspecified: Secondary | ICD-10-CM | POA: Insufficient documentation

## 2013-03-24 DIAGNOSIS — R52 Pain, unspecified: Secondary | ICD-10-CM | POA: Insufficient documentation

## 2013-03-24 DIAGNOSIS — J4489 Other specified chronic obstructive pulmonary disease: Secondary | ICD-10-CM | POA: Insufficient documentation

## 2013-03-24 DIAGNOSIS — M62838 Other muscle spasm: Secondary | ICD-10-CM | POA: Insufficient documentation

## 2013-03-24 DIAGNOSIS — Z79899 Other long term (current) drug therapy: Secondary | ICD-10-CM | POA: Insufficient documentation

## 2013-03-24 DIAGNOSIS — G8929 Other chronic pain: Secondary | ICD-10-CM | POA: Insufficient documentation

## 2013-03-24 DIAGNOSIS — Z9861 Coronary angioplasty status: Secondary | ICD-10-CM | POA: Insufficient documentation

## 2013-03-24 DIAGNOSIS — N186 End stage renal disease: Secondary | ICD-10-CM | POA: Insufficient documentation

## 2013-03-24 DIAGNOSIS — Z992 Dependence on renal dialysis: Secondary | ICD-10-CM | POA: Insufficient documentation

## 2013-03-24 DIAGNOSIS — F17200 Nicotine dependence, unspecified, uncomplicated: Secondary | ICD-10-CM | POA: Insufficient documentation

## 2013-03-24 DIAGNOSIS — G43909 Migraine, unspecified, not intractable, without status migrainosus: Secondary | ICD-10-CM | POA: Insufficient documentation

## 2013-03-24 DIAGNOSIS — I509 Heart failure, unspecified: Secondary | ICD-10-CM | POA: Insufficient documentation

## 2013-03-24 DIAGNOSIS — R109 Unspecified abdominal pain: Secondary | ICD-10-CM | POA: Insufficient documentation

## 2013-03-24 LAB — CBC WITH DIFFERENTIAL/PLATELET
Eosinophils Absolute: 0.4 10*3/uL (ref 0.0–0.7)
Eosinophils Relative: 7 % — ABNORMAL HIGH (ref 0–5)
Lymphs Abs: 1.4 10*3/uL (ref 0.7–4.0)
MCH: 31.6 pg (ref 26.0–34.0)
MCV: 94.3 fL (ref 78.0–100.0)
Platelets: 201 10*3/uL (ref 150–400)
RBC: 2.47 MIL/uL — ABNORMAL LOW (ref 4.22–5.81)

## 2013-03-24 LAB — BASIC METABOLIC PANEL
CO2: 28 mEq/L (ref 19–32)
Chloride: 89 mEq/L — ABNORMAL LOW (ref 96–112)
GFR calc Af Amer: 15 mL/min — ABNORMAL LOW (ref >90)
Sodium: 133 mEq/L — ABNORMAL LOW (ref 135–145)

## 2013-03-24 IMAGING — CR DG ABDOMEN 2V
2 series · 2 of 2 positions shown · non-contrast
Comparison: 08/09/2011 and a CT scan dated 06/11/2011.

CLINICAL DATA: Right flank pain.

ABDOMEN - 2 VIEW

[view not recorded (1 of 2)]
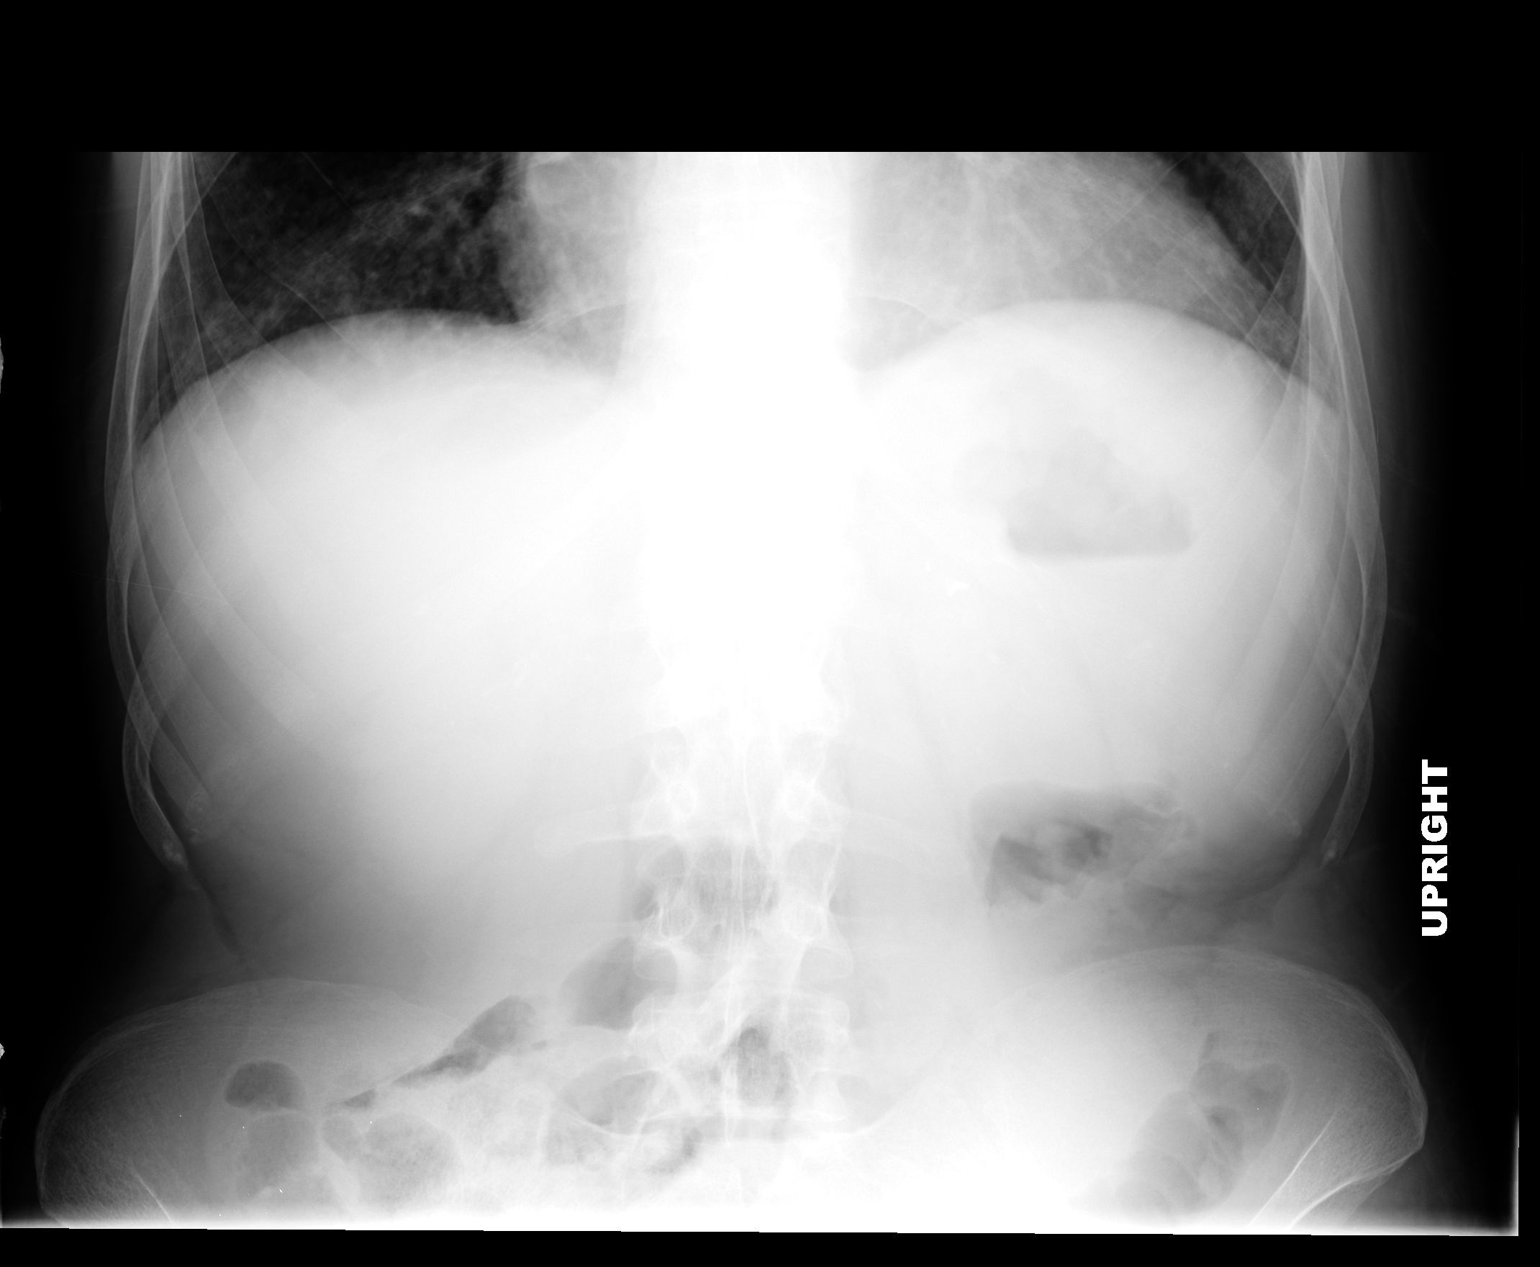

[view not recorded (2 of 2)]
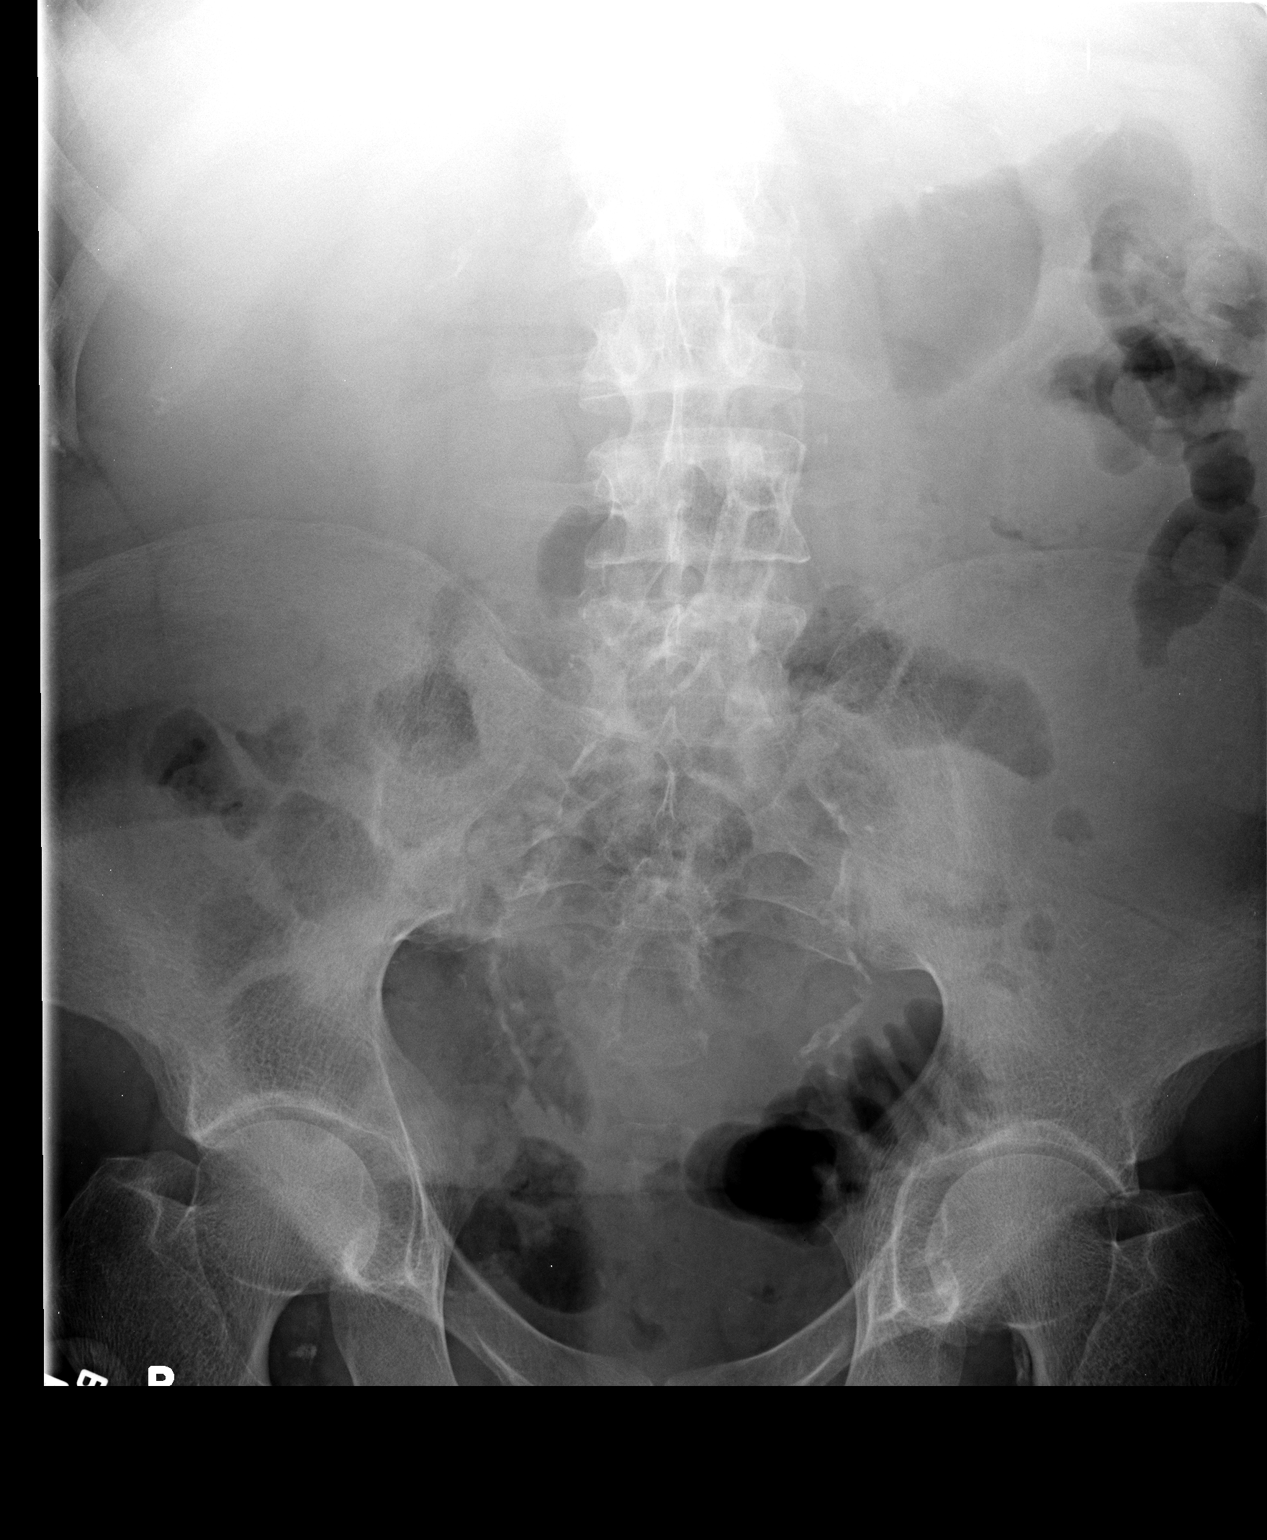

[2 of 2 positions shown; findings below may reference images not displayed]

FINDINGS: The bowel gas pattern is normal.  No renal or ureteral
calculi.  Extensive vascular calcifications.  No acute osseous
abnormalities.

No free air.
IMPRESSION: Benign-appearing abdomen.

## 2013-03-24 MED ORDER — HYDROMORPHONE HCL PF 2 MG/ML IJ SOLN
2.0000 mg | Freq: Once | INTRAMUSCULAR | Status: AC
  Administered 2013-03-24: 2 mg via INTRAMUSCULAR
  Filled 2013-03-24: qty 1

## 2013-03-24 MED ORDER — ONDANSETRON 8 MG PO TBDP
8.0000 mg | ORAL_TABLET | Freq: Once | ORAL | Status: AC
  Administered 2013-03-24: 8 mg via ORAL
  Filled 2013-03-24: qty 1

## 2013-03-24 MED ORDER — CYCLOBENZAPRINE HCL 10 MG PO TABS: 10.0000 mg | ORAL_TABLET | Freq: Three times a day (TID) | ORAL | Status: DC | PRN
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
## 2013-03-24 NOTE — ED Notes (Signed)
Patient with no complaints at this time. Respirations even and unlabored. Skin warm/dry. Discharge instructions reviewed with patient at this time. Patient given opportunity to voice concerns/ask questions. Patient discharged at this time and left Emergency Department with steady gait.   

## 2013-03-24 NOTE — ED Notes (Addendum)
Headache 1 hour after taking isosorbide MN 30mg  and states he has generalized body aches.
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
## 2013-03-24 NOTE — ED Notes (Signed)
Pt reports since 4pm yesterday has had headache, neck pain, and pain in shoulders.  Reports took 4 percocet since 4pm yesterday without any relief.  Reports was evaluated in the ED last night for same.

## 2013-03-25 ENCOUNTER — Emergency Department (HOSPITAL_COMMUNITY)
Admission: EM | Admit: 2013-03-25 | Discharge: 2013-03-25 | Disposition: A | Discharge status: Home / Self Care | Payer: Medicare Other | Attending: Emergency Medicine | Admitting: Emergency Medicine

## 2013-03-25 ENCOUNTER — Encounter (HOSPITAL_COMMUNITY): Payer: Self-pay | Admitting: *Deleted

## 2013-03-25 DIAGNOSIS — J4489 Other specified chronic obstructive pulmonary disease: Secondary | ICD-10-CM | POA: Insufficient documentation

## 2013-03-25 DIAGNOSIS — M436 Torticollis: Secondary | ICD-10-CM | POA: Insufficient documentation

## 2013-03-25 DIAGNOSIS — I129 Hypertensive chronic kidney disease with stage 1 through stage 4 chronic kidney disease, or unspecified chronic kidney disease: Secondary | ICD-10-CM | POA: Insufficient documentation

## 2013-03-25 DIAGNOSIS — Z9861 Coronary angioplasty status: Secondary | ICD-10-CM | POA: Insufficient documentation

## 2013-03-25 DIAGNOSIS — Z8701 Personal history of pneumonia (recurrent): Secondary | ICD-10-CM | POA: Insufficient documentation

## 2013-03-25 DIAGNOSIS — Z79899 Other long term (current) drug therapy: Secondary | ICD-10-CM | POA: Insufficient documentation

## 2013-03-25 DIAGNOSIS — F209 Schizophrenia, unspecified: Secondary | ICD-10-CM | POA: Insufficient documentation

## 2013-03-25 DIAGNOSIS — M549 Dorsalgia, unspecified: Secondary | ICD-10-CM | POA: Insufficient documentation

## 2013-03-25 DIAGNOSIS — N186 End stage renal disease: Secondary | ICD-10-CM | POA: Insufficient documentation

## 2013-03-25 DIAGNOSIS — G894 Chronic pain syndrome: Secondary | ICD-10-CM | POA: Insufficient documentation

## 2013-03-25 DIAGNOSIS — G8929 Other chronic pain: Secondary | ICD-10-CM | POA: Insufficient documentation

## 2013-03-25 DIAGNOSIS — R609 Edema, unspecified: Secondary | ICD-10-CM | POA: Insufficient documentation

## 2013-03-25 DIAGNOSIS — F319 Bipolar disorder, unspecified: Secondary | ICD-10-CM | POA: Insufficient documentation

## 2013-03-25 DIAGNOSIS — F17200 Nicotine dependence, unspecified, uncomplicated: Secondary | ICD-10-CM | POA: Insufficient documentation

## 2013-03-25 DIAGNOSIS — Z992 Dependence on renal dialysis: Secondary | ICD-10-CM | POA: Insufficient documentation

## 2013-03-25 DIAGNOSIS — Z8679 Personal history of other diseases of the circulatory system: Secondary | ICD-10-CM | POA: Insufficient documentation

## 2013-03-25 DIAGNOSIS — I2589 Other forms of chronic ischemic heart disease: Secondary | ICD-10-CM | POA: Insufficient documentation

## 2013-03-25 DIAGNOSIS — M7989 Other specified soft tissue disorders: Secondary | ICD-10-CM | POA: Insufficient documentation

## 2013-03-25 DIAGNOSIS — J449 Chronic obstructive pulmonary disease, unspecified: Secondary | ICD-10-CM | POA: Insufficient documentation

## 2013-03-25 DIAGNOSIS — M542 Cervicalgia: Secondary | ICD-10-CM | POA: Insufficient documentation

## 2013-03-25 DIAGNOSIS — Z7982 Long term (current) use of aspirin: Secondary | ICD-10-CM | POA: Insufficient documentation

## 2013-03-25 DIAGNOSIS — Z885 Allergy status to narcotic agent status: Secondary | ICD-10-CM | POA: Insufficient documentation

## 2013-03-25 MED ORDER — HYDROMORPHONE HCL PF 1 MG/ML IJ SOLN
2.0000 mg | Freq: Once | INTRAMUSCULAR | Status: AC
  Administered 2013-03-25: 2 mg via INTRAMUSCULAR
  Filled 2013-03-25: qty 2

## 2013-03-25 MED ORDER — ONDANSETRON 8 MG PO TBDP
8.0000 mg | ORAL_TABLET | Freq: Once | ORAL | Status: AC
  Administered 2013-03-25: 8 mg via ORAL
  Filled 2013-03-25: qty 1
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
## 2013-03-25 NOTE — ED Notes (Signed)
Neck, right shoulder, back and head pain waking pt up at 0300 this morning.  States took flexaril that he received here yesterday with no relief.  Skin gray in color, pt states, "I just don't feel good."

## 2013-03-25 NOTE — Discharge Summary (Signed)
NAME:  [name], [name] NO.:  [id number]  MEDICAL RECORD NO.:  [id number]  LOCATION:                                 FACILITY:  PHYSICIAN:  Melvyn Novas, MDDATE OF BIRTH:  02-19-1976  DATE OF ADMISSION:  03/21/2013 DATE OF DISCHARGE:  04/05/2014LH                              DISCHARGE SUMMARY   HISTORY OF PRESENT ILLNESS:  The patient is well known to me with leading medical issues primarily end-stage renal disease, on dialysis; ischemic cardiomyopathy with two-vessel disease status post stenting and ejection fraction 35-40%, accelerated hypertension, hyperlipidemia, COPD with continued active smoking of pack to pack and half per day, schizophrenia with schizoaffective disorder, chronic bipolar disease, and chronic noncompliance.  The patient was admitted with increasing dyspnea and found to be in volume overload per chest x-ray and clinically.  He had been attending his dialysis, but frequently cut them short.  He did have some suboptimally controlled hypertension.  He was promptly admitted and seen by Cardiology and Nephrology given 2 days successive of diuresis with his hemodynamic improvement and subsequent volume depletion.  He did have some atypical chest pain upon admission, cardiac enzymes were negative, and this was felt to be totally atypical by both cardiology and myself, but there was no clinical active ischemia noted.  The patient had 2 days of dialysis successfully, was hemodynamically improved.  Lungs were clear.  He had less dyspnea, less orthopnea, less PND, no anginal type chest pain.  It was subsequently elected to discharge him on the following medicines; Norvasc 10 p.o. daily, aspirin 81 p.o. daily, Coreg 12.5 p.o. b.i.d., clonidine 0.2 mg p.o. t.i.d., Cymbalta 60 p.o. daily; hydralazine 50 mg p.o. t.i.d., Imdur 30 mg p.o. daily, Normodyne 800 mg p.o. b.i.d., Prevacid 30 mg p.o. daily, Zestril 20 mg p.o. daily, Zyprexa 10 mg  p.o. b.i.d., Pravachol 40 mg p.o. daily, Requip 1 mg p.o. at bedtime as well as Ambien 10 mg p.o. at bedtime and Topamax 50 mg p.o. at bedtime.  The patient was cautioned not to smoke, to take all medicines, to follow up with dialysis on Monday or Tuesday, and return to the ER if chest pain continues or recurs.     Melvyn Novas, MD     RMD/MEDQ  D:  03/23/2013  T:  03/23/2013  Job:  [id number]

## 2013-03-25 NOTE — ED Provider Notes (Signed)
Medical screening examination/treatment/procedure(s) were performed by non-physician practitioner and as supervising physician I was immediately available for consultation/collaboration.   Shayann Garbutt W. Katiana Ruland, MD 03/25/13 1635 

## 2013-03-26 ENCOUNTER — Emergency Department (HOSPITAL_COMMUNITY)
Admission: EM | Admit: 2013-03-26 | Discharge: 2013-03-26 | Disposition: A | Discharge status: Home / Self Care | Payer: Medicare Other | Attending: Emergency Medicine | Admitting: Emergency Medicine

## 2013-03-26 ENCOUNTER — Encounter (HOSPITAL_COMMUNITY): Payer: Self-pay | Admitting: *Deleted

## 2013-03-26 DIAGNOSIS — F17200 Nicotine dependence, unspecified, uncomplicated: Secondary | ICD-10-CM | POA: Insufficient documentation

## 2013-03-26 DIAGNOSIS — Z862 Personal history of diseases of the blood and blood-forming organs and certain disorders involving the immune mechanism: Secondary | ICD-10-CM | POA: Insufficient documentation

## 2013-03-26 DIAGNOSIS — Z7982 Long term (current) use of aspirin: Secondary | ICD-10-CM | POA: Insufficient documentation

## 2013-03-26 DIAGNOSIS — R51 Headache: Secondary | ICD-10-CM

## 2013-03-26 DIAGNOSIS — N186 End stage renal disease: Secondary | ICD-10-CM | POA: Insufficient documentation

## 2013-03-26 DIAGNOSIS — G8929 Other chronic pain: Secondary | ICD-10-CM | POA: Insufficient documentation

## 2013-03-26 DIAGNOSIS — Z9861 Coronary angioplasty status: Secondary | ICD-10-CM | POA: Insufficient documentation

## 2013-03-26 DIAGNOSIS — F319 Bipolar disorder, unspecified: Secondary | ICD-10-CM | POA: Insufficient documentation

## 2013-03-26 DIAGNOSIS — Z8701 Personal history of pneumonia (recurrent): Secondary | ICD-10-CM | POA: Insufficient documentation

## 2013-03-26 DIAGNOSIS — M25519 Pain in unspecified shoulder: Secondary | ICD-10-CM | POA: Insufficient documentation

## 2013-03-26 DIAGNOSIS — G894 Chronic pain syndrome: Secondary | ICD-10-CM | POA: Insufficient documentation

## 2013-03-26 DIAGNOSIS — I120 Hypertensive chronic kidney disease with stage 5 chronic kidney disease or end stage renal disease: Secondary | ICD-10-CM | POA: Insufficient documentation

## 2013-03-26 DIAGNOSIS — G43909 Migraine, unspecified, not intractable, without status migrainosus: Secondary | ICD-10-CM | POA: Insufficient documentation

## 2013-03-26 DIAGNOSIS — Z992 Dependence on renal dialysis: Secondary | ICD-10-CM | POA: Insufficient documentation

## 2013-03-26 DIAGNOSIS — J449 Chronic obstructive pulmonary disease, unspecified: Secondary | ICD-10-CM | POA: Insufficient documentation

## 2013-03-26 DIAGNOSIS — Z79899 Other long term (current) drug therapy: Secondary | ICD-10-CM | POA: Insufficient documentation

## 2013-03-26 DIAGNOSIS — Z8679 Personal history of other diseases of the circulatory system: Secondary | ICD-10-CM | POA: Insufficient documentation

## 2013-03-26 DIAGNOSIS — M542 Cervicalgia: Secondary | ICD-10-CM | POA: Insufficient documentation

## 2013-03-26 DIAGNOSIS — J4489 Other specified chronic obstructive pulmonary disease: Secondary | ICD-10-CM | POA: Insufficient documentation

## 2013-03-26 DIAGNOSIS — R109 Unspecified abdominal pain: Secondary | ICD-10-CM | POA: Insufficient documentation

## 2013-03-26 MED ORDER — HYDROMORPHONE HCL PF 2 MG/ML IJ SOLN
2.0000 mg | Freq: Once | INTRAMUSCULAR | Status: AC
  Administered 2013-03-26: 2 mg via INTRAMUSCULAR
  Filled 2013-03-26: qty 1

## 2013-03-26 MED ORDER — SUMATRIPTAN SUCCINATE 6 MG/0.5ML ~~LOC~~ SOLN
6.0000 mg | Freq: Once | SUBCUTANEOUS | Status: AC
  Administered 2013-03-26: 6 mg via SUBCUTANEOUS
  Filled 2013-03-26: qty 0.5

## 2013-03-26 NOTE — ED Notes (Signed)
Pt c/o headache, shoulder and neck pain, was seen in er yesterday for same,

## 2013-03-26 NOTE — ED Notes (Signed)
Dr Delo at bedside,  

## 2013-03-26 NOTE — ED Notes (Signed)
Headache, neck and both shoulders hurt.
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
## 2013-03-26 NOTE — ED Notes (Signed)
Dr. Judd Lien with pt

## 2013-03-27 ENCOUNTER — Emergency Department (HOSPITAL_COMMUNITY)
Admission: EM | Admit: 2013-03-27 | Discharge: 2013-03-27 | Disposition: A | Discharge status: Home / Self Care | Payer: Medicare Other | Attending: Emergency Medicine | Admitting: Emergency Medicine

## 2013-03-27 ENCOUNTER — Encounter (HOSPITAL_COMMUNITY): Payer: Self-pay

## 2013-03-27 DIAGNOSIS — Z79899 Other long term (current) drug therapy: Secondary | ICD-10-CM | POA: Insufficient documentation

## 2013-03-27 DIAGNOSIS — R51 Headache: Secondary | ICD-10-CM

## 2013-03-27 DIAGNOSIS — G8929 Other chronic pain: Secondary | ICD-10-CM | POA: Insufficient documentation

## 2013-03-27 DIAGNOSIS — Z992 Dependence on renal dialysis: Secondary | ICD-10-CM | POA: Insufficient documentation

## 2013-03-27 DIAGNOSIS — J4489 Other specified chronic obstructive pulmonary disease: Secondary | ICD-10-CM | POA: Insufficient documentation

## 2013-03-27 DIAGNOSIS — I120 Hypertensive chronic kidney disease with stage 5 chronic kidney disease or end stage renal disease: Secondary | ICD-10-CM | POA: Insufficient documentation

## 2013-03-27 DIAGNOSIS — D649 Anemia, unspecified: Secondary | ICD-10-CM | POA: Insufficient documentation

## 2013-03-27 DIAGNOSIS — J449 Chronic obstructive pulmonary disease, unspecified: Secondary | ICD-10-CM | POA: Insufficient documentation

## 2013-03-27 DIAGNOSIS — Z8701 Personal history of pneumonia (recurrent): Secondary | ICD-10-CM | POA: Insufficient documentation

## 2013-03-27 DIAGNOSIS — F209 Schizophrenia, unspecified: Secondary | ICD-10-CM | POA: Insufficient documentation

## 2013-03-27 DIAGNOSIS — Z8679 Personal history of other diseases of the circulatory system: Secondary | ICD-10-CM | POA: Insufficient documentation

## 2013-03-27 DIAGNOSIS — F319 Bipolar disorder, unspecified: Secondary | ICD-10-CM | POA: Insufficient documentation

## 2013-03-27 DIAGNOSIS — F17200 Nicotine dependence, unspecified, uncomplicated: Secondary | ICD-10-CM | POA: Insufficient documentation

## 2013-03-27 DIAGNOSIS — Z9861 Coronary angioplasty status: Secondary | ICD-10-CM | POA: Insufficient documentation

## 2013-03-27 DIAGNOSIS — Z7982 Long term (current) use of aspirin: Secondary | ICD-10-CM | POA: Insufficient documentation

## 2013-03-27 DIAGNOSIS — H53149 Visual discomfort, unspecified: Secondary | ICD-10-CM | POA: Insufficient documentation

## 2013-03-27 DIAGNOSIS — M542 Cervicalgia: Secondary | ICD-10-CM | POA: Insufficient documentation

## 2013-03-27 DIAGNOSIS — N186 End stage renal disease: Secondary | ICD-10-CM | POA: Insufficient documentation

## 2013-03-27 DIAGNOSIS — M25519 Pain in unspecified shoulder: Secondary | ICD-10-CM | POA: Insufficient documentation

## 2013-03-27 DIAGNOSIS — R109 Unspecified abdominal pain: Secondary | ICD-10-CM | POA: Insufficient documentation

## 2013-03-27 DIAGNOSIS — I509 Heart failure, unspecified: Secondary | ICD-10-CM | POA: Insufficient documentation

## 2013-03-27 IMAGING — CR DG CHEST 2V
2 series · 2 of 2 positions shown · non-contrast
Comparison: 09/05/2011

CLINICAL DATA: Shortness of breath and cough, hypoxia

CHEST - 2 VIEW

[view not recorded (1 of 2)]
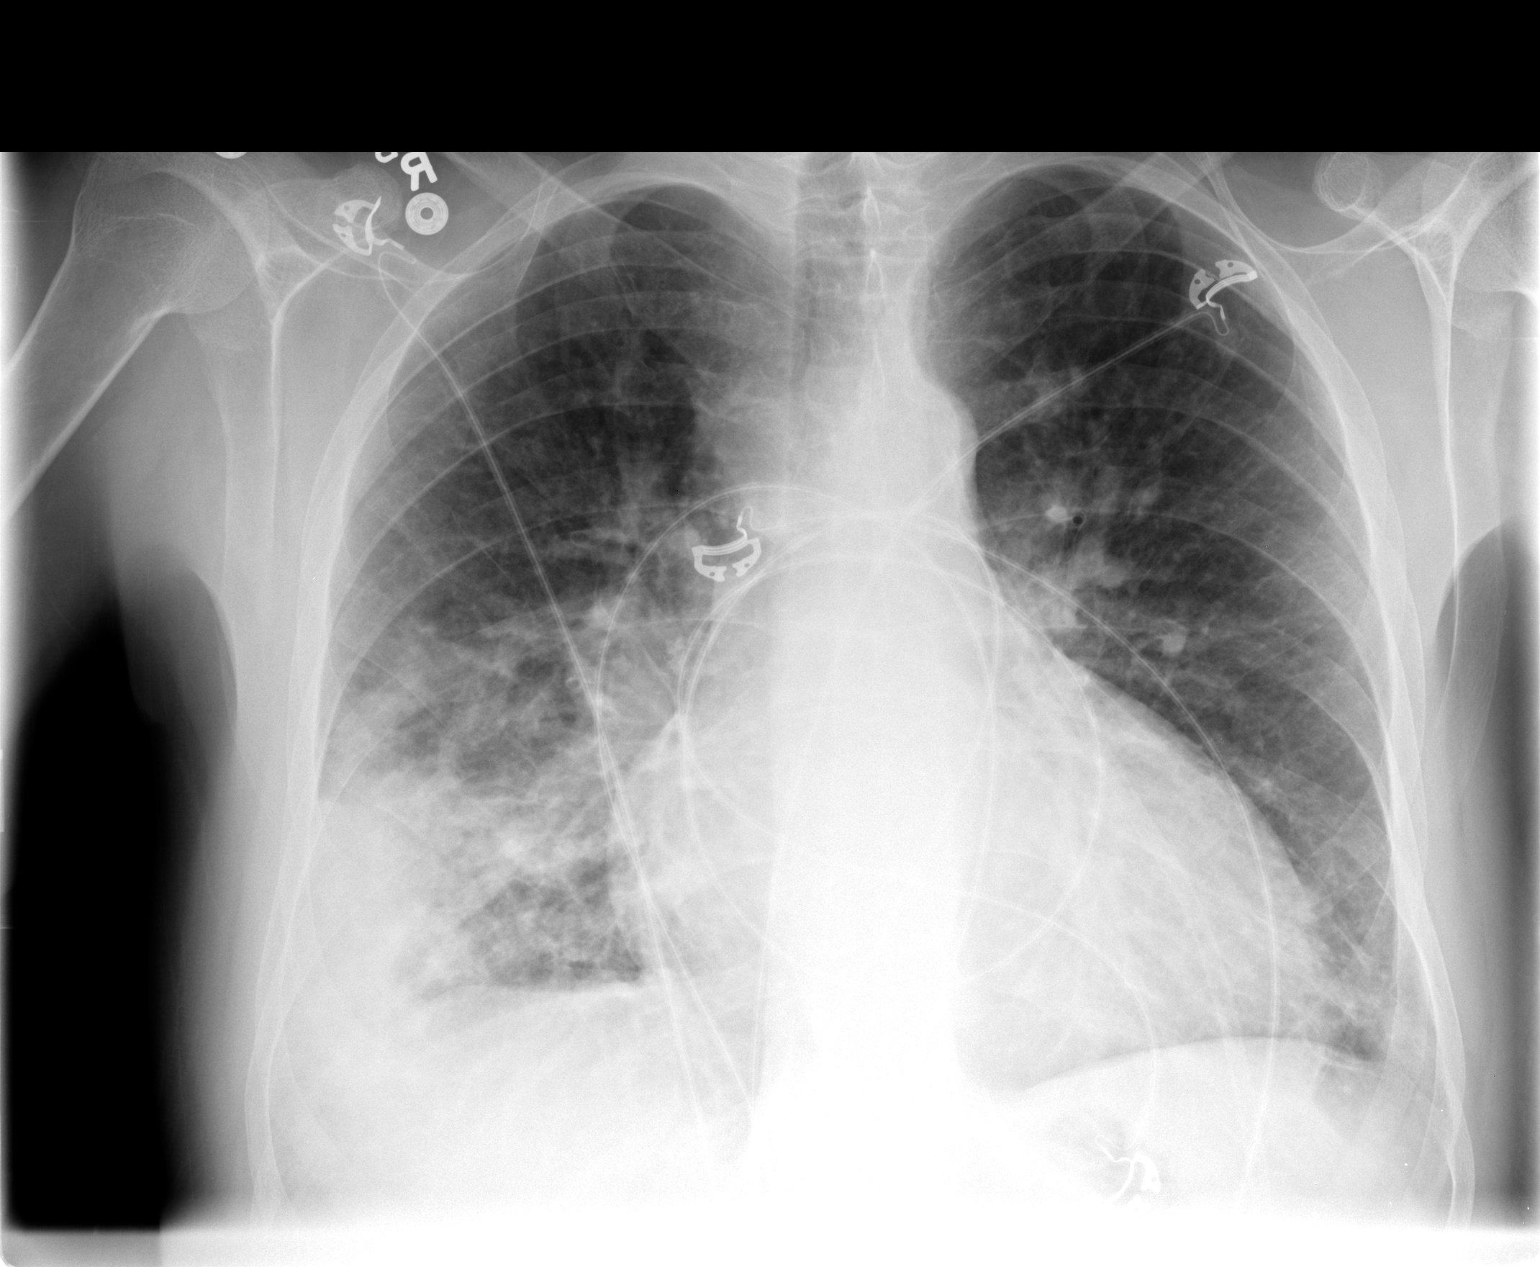

[view not recorded (2 of 2)]
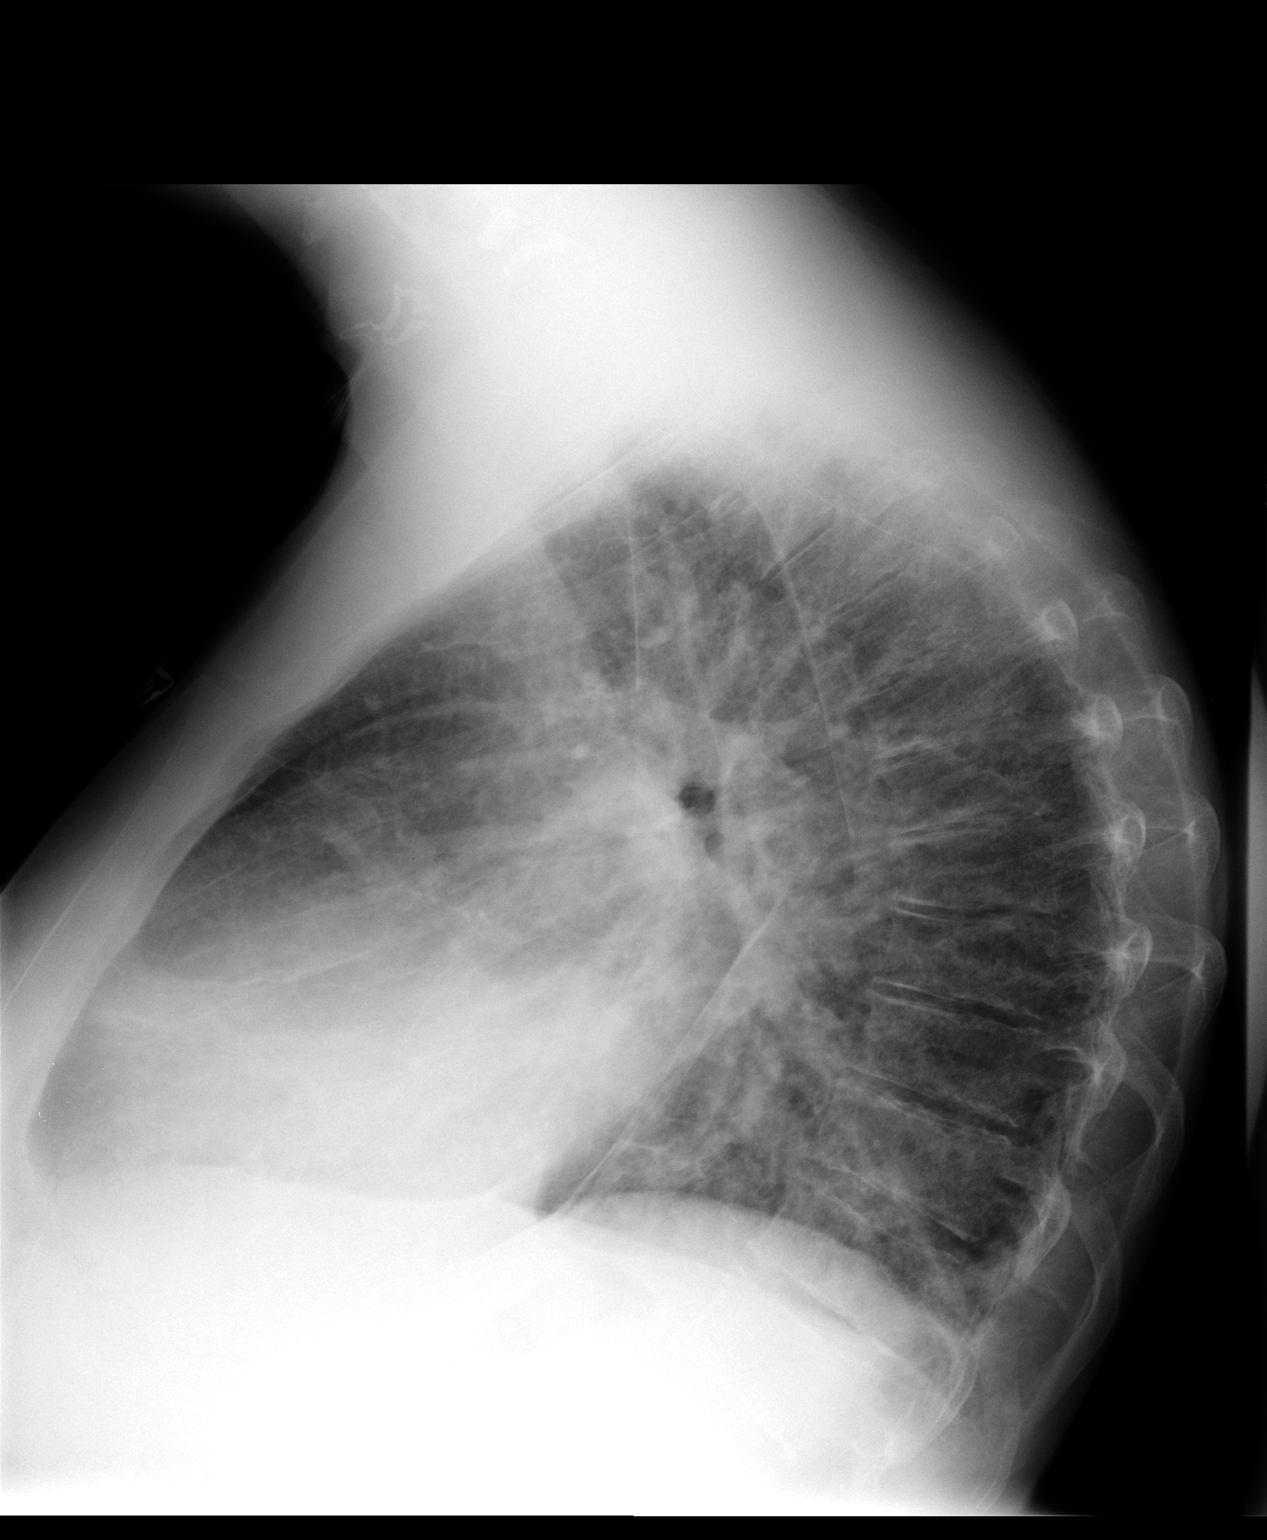

[2 of 2 positions shown; findings below may reference images not displayed]

FINDINGS: Worsening right lower lobe confluent airspace opacity is
noted.  Minimal patchy left lower lobe airspace disease is also
noted.  Lung markings are diffusely prominent.  Heart size is
mildly enlarged, without edema.  Small right effusion with is
noted.
IMPRESSION: Worsening of right lower lobe confluent airspace opacity, with new
possible left lower lobe airspace disease also noted.

## 2013-03-27 MED ORDER — HYDROMORPHONE HCL PF 2 MG/ML IJ SOLN
2.0000 mg | Freq: Once | INTRAMUSCULAR | Status: AC
  Administered 2013-03-27: 2 mg via INTRAMUSCULAR
  Filled 2013-03-27: qty 1

## 2013-03-27 NOTE — ED Notes (Signed)
Complain of migraine headache. Denies n/v/d. Has history. States he is going to see his doctor in the morning
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
## 2013-03-28 NOTE — ED Provider Notes (Signed)
Medical screening examination/treatment/procedure(s) were performed by non-physician practitioner and as supervising physician I was immediately available for consultation/collaboration.   Benny Lennert, MD 03/28/13 [phone]

## 2013-04-02 IMAGING — CR DG CHEST 1V PORT
1 series · 1 of 1 positions shown · non-contrast
Comparison: 09/12/2011

CLINICAL DATA: Shortness of breath

PORTABLE CHEST - 1 VIEW

[view not recorded]
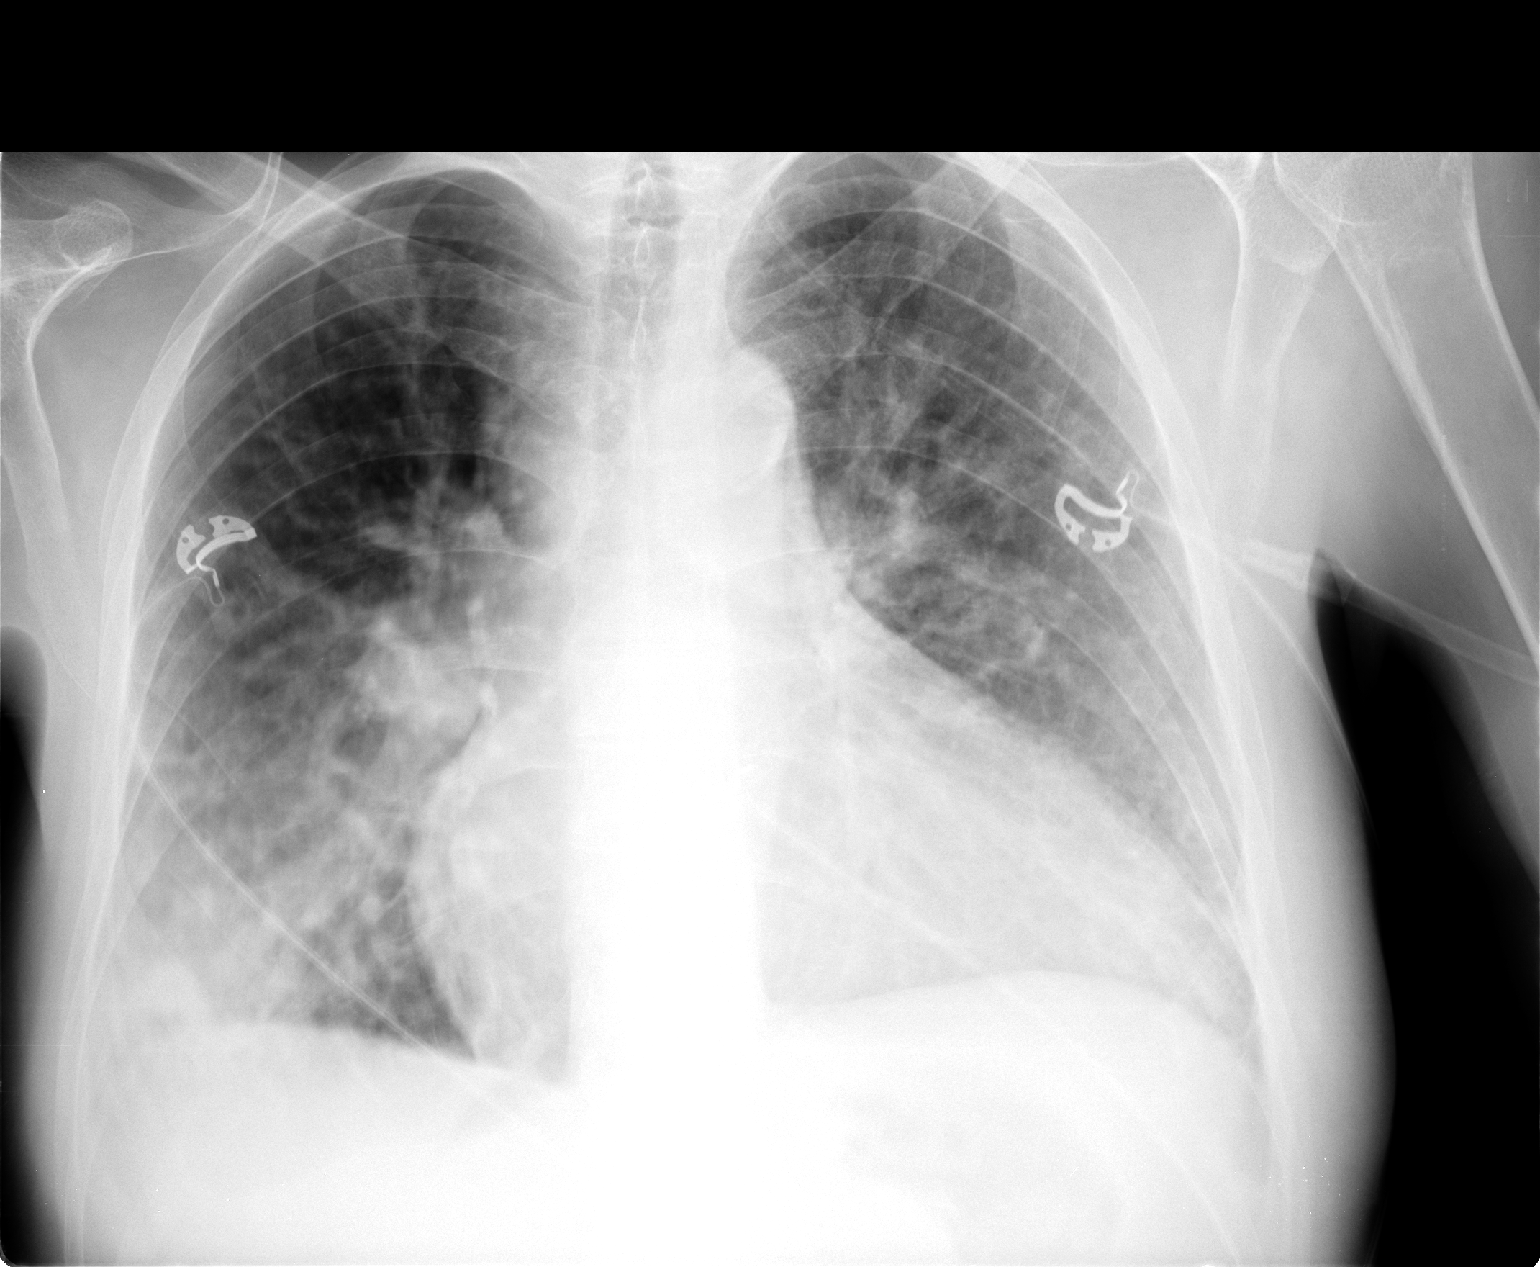

[1 of 1 positions shown; findings below may reference images not displayed]

FINDINGS: Cardiomegaly again noted. Central mild vascular
congestion and mild perihilar interstitial prominence suspicious
for mild edema.  Persistent infiltrate/consolidation in the right
lower lobe.  Airspace disease left base is unchanged.
IMPRESSION: Central mild vascular congestion and mild perihilar interstitial
prominence suspicious for mild edema.  Persistent
infiltrate/consolidation in the right lower lobe.  Airspace disease
left base is unchanged.

## 2013-04-05 ENCOUNTER — Encounter (HOSPITAL_COMMUNITY): Payer: Self-pay | Admitting: *Deleted

## 2013-04-05 ENCOUNTER — Emergency Department (HOSPITAL_COMMUNITY)
Admission: EM | Admit: 2013-04-05 | Discharge: 2013-04-05 | Disposition: A | Discharge status: Home / Self Care | Payer: Medicare Other | Attending: Emergency Medicine | Admitting: Emergency Medicine

## 2013-04-05 DIAGNOSIS — Z79899 Other long term (current) drug therapy: Secondary | ICD-10-CM | POA: Insufficient documentation

## 2013-04-05 DIAGNOSIS — J449 Chronic obstructive pulmonary disease, unspecified: Secondary | ICD-10-CM | POA: Insufficient documentation

## 2013-04-05 DIAGNOSIS — G8929 Other chronic pain: Secondary | ICD-10-CM | POA: Insufficient documentation

## 2013-04-05 DIAGNOSIS — F209 Schizophrenia, unspecified: Secondary | ICD-10-CM | POA: Insufficient documentation

## 2013-04-05 DIAGNOSIS — I120 Hypertensive chronic kidney disease with stage 5 chronic kidney disease or end stage renal disease: Secondary | ICD-10-CM | POA: Insufficient documentation

## 2013-04-05 DIAGNOSIS — Z992 Dependence on renal dialysis: Secondary | ICD-10-CM | POA: Insufficient documentation

## 2013-04-05 DIAGNOSIS — R51 Headache: Secondary | ICD-10-CM | POA: Insufficient documentation

## 2013-04-05 DIAGNOSIS — F319 Bipolar disorder, unspecified: Secondary | ICD-10-CM | POA: Insufficient documentation

## 2013-04-05 DIAGNOSIS — Z9861 Coronary angioplasty status: Secondary | ICD-10-CM | POA: Insufficient documentation

## 2013-04-05 DIAGNOSIS — M62838 Other muscle spasm: Secondary | ICD-10-CM | POA: Insufficient documentation

## 2013-04-05 DIAGNOSIS — Z8701 Personal history of pneumonia (recurrent): Secondary | ICD-10-CM | POA: Insufficient documentation

## 2013-04-05 DIAGNOSIS — Z862 Personal history of diseases of the blood and blood-forming organs and certain disorders involving the immune mechanism: Secondary | ICD-10-CM | POA: Insufficient documentation

## 2013-04-05 DIAGNOSIS — N186 End stage renal disease: Secondary | ICD-10-CM | POA: Insufficient documentation

## 2013-04-05 DIAGNOSIS — F17200 Nicotine dependence, unspecified, uncomplicated: Secondary | ICD-10-CM | POA: Insufficient documentation

## 2013-04-05 DIAGNOSIS — Z7982 Long term (current) use of aspirin: Secondary | ICD-10-CM | POA: Insufficient documentation

## 2013-04-05 DIAGNOSIS — Z8679 Personal history of other diseases of the circulatory system: Secondary | ICD-10-CM | POA: Insufficient documentation

## 2013-04-05 DIAGNOSIS — J4489 Other specified chronic obstructive pulmonary disease: Secondary | ICD-10-CM | POA: Insufficient documentation

## 2013-04-05 MED ORDER — HYDROMORPHONE HCL PF 2 MG/ML IJ SOLN
2.0000 mg | Freq: Once | INTRAMUSCULAR | Status: AC
  Administered 2013-04-05: 2 mg via INTRAMUSCULAR
  Filled 2013-04-05: qty 1
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
## 2013-04-05 NOTE — ED Notes (Signed)
Headache,neck and both shoulders hurt, Went to dialysis today.  No injury

## 2013-04-06 ENCOUNTER — Emergency Department (HOSPITAL_COMMUNITY)
Admission: EM | Admit: 2013-04-06 | Discharge: 2013-04-06 | Disposition: A | Discharge status: Home / Self Care | Payer: Medicare Other | Attending: Emergency Medicine | Admitting: Emergency Medicine

## 2013-04-06 ENCOUNTER — Encounter (HOSPITAL_COMMUNITY): Payer: Self-pay

## 2013-04-06 DIAGNOSIS — Z9861 Coronary angioplasty status: Secondary | ICD-10-CM | POA: Insufficient documentation

## 2013-04-06 DIAGNOSIS — Z7982 Long term (current) use of aspirin: Secondary | ICD-10-CM | POA: Insufficient documentation

## 2013-04-06 DIAGNOSIS — N186 End stage renal disease: Secondary | ICD-10-CM | POA: Insufficient documentation

## 2013-04-06 DIAGNOSIS — J449 Chronic obstructive pulmonary disease, unspecified: Secondary | ICD-10-CM | POA: Insufficient documentation

## 2013-04-06 DIAGNOSIS — D649 Anemia, unspecified: Secondary | ICD-10-CM | POA: Insufficient documentation

## 2013-04-06 DIAGNOSIS — G8929 Other chronic pain: Secondary | ICD-10-CM | POA: Insufficient documentation

## 2013-04-06 DIAGNOSIS — R109 Unspecified abdominal pain: Secondary | ICD-10-CM | POA: Insufficient documentation

## 2013-04-06 DIAGNOSIS — I509 Heart failure, unspecified: Secondary | ICD-10-CM | POA: Insufficient documentation

## 2013-04-06 DIAGNOSIS — Z8679 Personal history of other diseases of the circulatory system: Secondary | ICD-10-CM | POA: Insufficient documentation

## 2013-04-06 DIAGNOSIS — I120 Hypertensive chronic kidney disease with stage 5 chronic kidney disease or end stage renal disease: Secondary | ICD-10-CM | POA: Insufficient documentation

## 2013-04-06 DIAGNOSIS — Z862 Personal history of diseases of the blood and blood-forming organs and certain disorders involving the immune mechanism: Secondary | ICD-10-CM | POA: Insufficient documentation

## 2013-04-06 DIAGNOSIS — F17200 Nicotine dependence, unspecified, uncomplicated: Secondary | ICD-10-CM | POA: Insufficient documentation

## 2013-04-06 DIAGNOSIS — F209 Schizophrenia, unspecified: Secondary | ICD-10-CM | POA: Insufficient documentation

## 2013-04-06 DIAGNOSIS — Z8701 Personal history of pneumonia (recurrent): Secondary | ICD-10-CM | POA: Insufficient documentation

## 2013-04-06 DIAGNOSIS — G43909 Migraine, unspecified, not intractable, without status migrainosus: Secondary | ICD-10-CM | POA: Insufficient documentation

## 2013-04-06 DIAGNOSIS — Z8639 Personal history of other endocrine, nutritional and metabolic disease: Secondary | ICD-10-CM | POA: Insufficient documentation

## 2013-04-06 DIAGNOSIS — F319 Bipolar disorder, unspecified: Secondary | ICD-10-CM | POA: Insufficient documentation

## 2013-04-06 DIAGNOSIS — Z79899 Other long term (current) drug therapy: Secondary | ICD-10-CM | POA: Insufficient documentation

## 2013-04-06 DIAGNOSIS — H53149 Visual discomfort, unspecified: Secondary | ICD-10-CM | POA: Insufficient documentation

## 2013-04-06 DIAGNOSIS — R110 Nausea: Secondary | ICD-10-CM | POA: Insufficient documentation

## 2013-04-06 DIAGNOSIS — J4489 Other specified chronic obstructive pulmonary disease: Secondary | ICD-10-CM | POA: Insufficient documentation

## 2013-04-06 DIAGNOSIS — Z992 Dependence on renal dialysis: Secondary | ICD-10-CM | POA: Insufficient documentation

## 2013-04-06 MED ORDER — HYDROMORPHONE HCL PF 1 MG/ML IJ SOLN
2.0000 mg | Freq: Once | INTRAMUSCULAR | Status: AC
  Administered 2013-04-06: 2 mg via INTRAMUSCULAR
  Filled 2013-04-06: qty 2

## 2013-04-06 MED ORDER — ONDANSETRON 8 MG PO TBDP
8.0000 mg | ORAL_TABLET | Freq: Once | ORAL | Status: AC
  Administered 2013-04-06: 8 mg via ORAL
  Filled 2013-04-06: qty 1

## 2013-04-06 NOTE — ED Notes (Signed)
Pt says was evaluated here for headache yesterday.  Says was given a shot that helped.  Reports headache came back this morning during dialysis.
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
## 2013-04-06 NOTE — ED Notes (Signed)
Pt presents with Headache that presented in mist of his dialysis treatment this morning. Pt reports taking percocet after arriving at home and eating breakfast without relief.  Pt reports pain is in forehead , pain does not become intense with bending forward. Pt reports receiving dilaudid yesterday for same type headache, with relief. Denies N/V/F and photosensitivity. NAD noted. Pt skin color is noted to be worsening with each visit, to be an olive/orange color.

## 2013-04-07 ENCOUNTER — Encounter (HOSPITAL_COMMUNITY): Payer: Self-pay | Admitting: *Deleted

## 2013-04-07 ENCOUNTER — Emergency Department (HOSPITAL_COMMUNITY)
Admission: EM | Admit: 2013-04-07 | Discharge: 2013-04-07 | Disposition: A | Discharge status: Home / Self Care | Payer: Medicare Other | Attending: Emergency Medicine | Admitting: Emergency Medicine

## 2013-04-07 DIAGNOSIS — F17200 Nicotine dependence, unspecified, uncomplicated: Secondary | ICD-10-CM | POA: Insufficient documentation

## 2013-04-07 DIAGNOSIS — Z7982 Long term (current) use of aspirin: Secondary | ICD-10-CM | POA: Insufficient documentation

## 2013-04-07 DIAGNOSIS — G43909 Migraine, unspecified, not intractable, without status migrainosus: Secondary | ICD-10-CM | POA: Insufficient documentation

## 2013-04-07 DIAGNOSIS — Z8679 Personal history of other diseases of the circulatory system: Secondary | ICD-10-CM | POA: Insufficient documentation

## 2013-04-07 DIAGNOSIS — Z992 Dependence on renal dialysis: Secondary | ICD-10-CM | POA: Insufficient documentation

## 2013-04-07 DIAGNOSIS — N186 End stage renal disease: Secondary | ICD-10-CM | POA: Insufficient documentation

## 2013-04-07 DIAGNOSIS — J449 Chronic obstructive pulmonary disease, unspecified: Secondary | ICD-10-CM | POA: Insufficient documentation

## 2013-04-07 DIAGNOSIS — R51 Headache: Secondary | ICD-10-CM

## 2013-04-07 DIAGNOSIS — J4489 Other specified chronic obstructive pulmonary disease: Secondary | ICD-10-CM | POA: Insufficient documentation

## 2013-04-07 DIAGNOSIS — Z8701 Personal history of pneumonia (recurrent): Secondary | ICD-10-CM | POA: Insufficient documentation

## 2013-04-07 DIAGNOSIS — Z79899 Other long term (current) drug therapy: Secondary | ICD-10-CM | POA: Insufficient documentation

## 2013-04-07 DIAGNOSIS — Z862 Personal history of diseases of the blood and blood-forming organs and certain disorders involving the immune mechanism: Secondary | ICD-10-CM | POA: Insufficient documentation

## 2013-04-07 DIAGNOSIS — F209 Schizophrenia, unspecified: Secondary | ICD-10-CM | POA: Insufficient documentation

## 2013-04-07 DIAGNOSIS — F319 Bipolar disorder, unspecified: Secondary | ICD-10-CM | POA: Insufficient documentation

## 2013-04-07 DIAGNOSIS — Z9861 Coronary angioplasty status: Secondary | ICD-10-CM | POA: Insufficient documentation

## 2013-04-07 DIAGNOSIS — I120 Hypertensive chronic kidney disease with stage 5 chronic kidney disease or end stage renal disease: Secondary | ICD-10-CM | POA: Insufficient documentation

## 2013-04-07 DIAGNOSIS — R062 Wheezing: Secondary | ICD-10-CM | POA: Insufficient documentation

## 2013-04-07 MED ORDER — HYDROMORPHONE HCL PF 1 MG/ML IJ SOLN
1.0000 mg | Freq: Once | INTRAMUSCULAR | Status: AC
  Administered 2013-04-07: 1 mg via INTRAMUSCULAR
  Filled 2013-04-07: qty 1

## 2013-04-07 MED ORDER — ONDANSETRON HCL 4 MG PO TABS
4.0000 mg | ORAL_TABLET | Freq: Once | ORAL | Status: AC
  Administered 2013-04-07: 4 mg via ORAL
  Filled 2013-04-07: qty 1

## 2013-04-07 NOTE — ED Notes (Signed)
Pt c/o headache that started this am, has taken percocet at home without relief, denies any n/v
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
## 2013-04-07 NOTE — ED Provider Notes (Signed)
Medical screening examination/treatment/procedure(s) were performed by non-physician practitioner and as supervising physician I was immediately available for consultation/collaboration.\   Desirea Mizrahi L Joetta Delprado, MD 04/07/13 2345 

## 2013-04-08 NOTE — ED Provider Notes (Signed)
Medical screening examination/treatment/procedure(s) were performed by non-physician practitioner and as supervising physician I was immediately available for consultation/collaboration.   Corbin Falck M Connie Hilgert, DO 04/08/13 1325 

## 2013-04-11 ENCOUNTER — Encounter (HOSPITAL_COMMUNITY): Payer: Self-pay | Admitting: Emergency Medicine

## 2013-04-11 ENCOUNTER — Emergency Department (HOSPITAL_COMMUNITY)
Admission: EM | Admit: 2013-04-11 | Discharge: 2013-04-11 | Disposition: A | Discharge status: Home / Self Care | Payer: Medicare Other | Attending: Emergency Medicine | Admitting: Emergency Medicine

## 2013-04-11 DIAGNOSIS — Y929 Unspecified place or not applicable: Secondary | ICD-10-CM | POA: Insufficient documentation

## 2013-04-11 DIAGNOSIS — Z7982 Long term (current) use of aspirin: Secondary | ICD-10-CM | POA: Insufficient documentation

## 2013-04-11 DIAGNOSIS — S7010XA Contusion of unspecified thigh, initial encounter: Secondary | ICD-10-CM | POA: Insufficient documentation

## 2013-04-11 DIAGNOSIS — F17200 Nicotine dependence, unspecified, uncomplicated: Secondary | ICD-10-CM | POA: Insufficient documentation

## 2013-04-11 DIAGNOSIS — S7001XA Contusion of right hip, initial encounter: Secondary | ICD-10-CM

## 2013-04-11 DIAGNOSIS — Z992 Dependence on renal dialysis: Secondary | ICD-10-CM | POA: Insufficient documentation

## 2013-04-11 DIAGNOSIS — Z862 Personal history of diseases of the blood and blood-forming organs and certain disorders involving the immune mechanism: Secondary | ICD-10-CM | POA: Insufficient documentation

## 2013-04-11 DIAGNOSIS — J449 Chronic obstructive pulmonary disease, unspecified: Secondary | ICD-10-CM | POA: Insufficient documentation

## 2013-04-11 DIAGNOSIS — F319 Bipolar disorder, unspecified: Secondary | ICD-10-CM | POA: Insufficient documentation

## 2013-04-11 DIAGNOSIS — Y939 Activity, unspecified: Secondary | ICD-10-CM | POA: Insufficient documentation

## 2013-04-11 DIAGNOSIS — Z8701 Personal history of pneumonia (recurrent): Secondary | ICD-10-CM | POA: Insufficient documentation

## 2013-04-11 DIAGNOSIS — G894 Chronic pain syndrome: Secondary | ICD-10-CM | POA: Insufficient documentation

## 2013-04-11 DIAGNOSIS — IMO0002 Reserved for concepts with insufficient information to code with codable children: Secondary | ICD-10-CM | POA: Insufficient documentation

## 2013-04-11 DIAGNOSIS — F209 Schizophrenia, unspecified: Secondary | ICD-10-CM | POA: Insufficient documentation

## 2013-04-11 DIAGNOSIS — I120 Hypertensive chronic kidney disease with stage 5 chronic kidney disease or end stage renal disease: Secondary | ICD-10-CM | POA: Insufficient documentation

## 2013-04-11 DIAGNOSIS — Z79899 Other long term (current) drug therapy: Secondary | ICD-10-CM | POA: Insufficient documentation

## 2013-04-11 DIAGNOSIS — G43909 Migraine, unspecified, not intractable, without status migrainosus: Secondary | ICD-10-CM | POA: Insufficient documentation

## 2013-04-11 DIAGNOSIS — Z8679 Personal history of other diseases of the circulatory system: Secondary | ICD-10-CM | POA: Insufficient documentation

## 2013-04-11 DIAGNOSIS — N186 End stage renal disease: Secondary | ICD-10-CM | POA: Insufficient documentation

## 2013-04-11 DIAGNOSIS — J4489 Other specified chronic obstructive pulmonary disease: Secondary | ICD-10-CM | POA: Insufficient documentation

## 2013-04-11 MED ORDER — ONDANSETRON 4 MG PO TBDP
4.0000 mg | ORAL_TABLET | Freq: Once | ORAL | Status: AC
  Administered 2013-04-11: 4 mg via ORAL
  Filled 2013-04-11: qty 1

## 2013-04-11 MED ORDER — HYDROMORPHONE HCL PF 1 MG/ML IJ SOLN
1.0000 mg | Freq: Once | INTRAMUSCULAR | Status: AC
  Administered 2013-04-11: 1 mg via INTRAMUSCULAR
  Filled 2013-04-11: qty 1

## 2013-04-11 NOTE — ED Notes (Signed)
Pt c/o lower back pain that radiates down right leg.  

## 2013-04-12 ENCOUNTER — Encounter (HOSPITAL_COMMUNITY): Payer: Self-pay | Admitting: *Deleted

## 2013-04-12 ENCOUNTER — Emergency Department (HOSPITAL_COMMUNITY)
Admission: EM | Admit: 2013-04-12 | Discharge: 2013-04-12 | Disposition: A | Discharge status: Home / Self Care | Payer: Medicare Other | Attending: Emergency Medicine | Admitting: Emergency Medicine

## 2013-04-12 DIAGNOSIS — R109 Unspecified abdominal pain: Secondary | ICD-10-CM | POA: Insufficient documentation

## 2013-04-12 DIAGNOSIS — J449 Chronic obstructive pulmonary disease, unspecified: Secondary | ICD-10-CM | POA: Insufficient documentation

## 2013-04-12 DIAGNOSIS — F319 Bipolar disorder, unspecified: Secondary | ICD-10-CM | POA: Insufficient documentation

## 2013-04-12 DIAGNOSIS — G8929 Other chronic pain: Secondary | ICD-10-CM | POA: Insufficient documentation

## 2013-04-12 DIAGNOSIS — Z8679 Personal history of other diseases of the circulatory system: Secondary | ICD-10-CM | POA: Insufficient documentation

## 2013-04-12 DIAGNOSIS — Z862 Personal history of diseases of the blood and blood-forming organs and certain disorders involving the immune mechanism: Secondary | ICD-10-CM | POA: Insufficient documentation

## 2013-04-12 DIAGNOSIS — I120 Hypertensive chronic kidney disease with stage 5 chronic kidney disease or end stage renal disease: Secondary | ICD-10-CM | POA: Insufficient documentation

## 2013-04-12 DIAGNOSIS — G894 Chronic pain syndrome: Secondary | ICD-10-CM | POA: Insufficient documentation

## 2013-04-12 DIAGNOSIS — Z992 Dependence on renal dialysis: Secondary | ICD-10-CM | POA: Insufficient documentation

## 2013-04-12 DIAGNOSIS — M542 Cervicalgia: Secondary | ICD-10-CM | POA: Insufficient documentation

## 2013-04-12 DIAGNOSIS — Z8639 Personal history of other endocrine, nutritional and metabolic disease: Secondary | ICD-10-CM | POA: Insufficient documentation

## 2013-04-12 DIAGNOSIS — Z7982 Long term (current) use of aspirin: Secondary | ICD-10-CM | POA: Insufficient documentation

## 2013-04-12 DIAGNOSIS — J4489 Other specified chronic obstructive pulmonary disease: Secondary | ICD-10-CM | POA: Insufficient documentation

## 2013-04-12 DIAGNOSIS — Z8701 Personal history of pneumonia (recurrent): Secondary | ICD-10-CM | POA: Insufficient documentation

## 2013-04-12 DIAGNOSIS — F17200 Nicotine dependence, unspecified, uncomplicated: Secondary | ICD-10-CM | POA: Insufficient documentation

## 2013-04-12 DIAGNOSIS — N186 End stage renal disease: Secondary | ICD-10-CM | POA: Insufficient documentation

## 2013-04-12 DIAGNOSIS — Z79899 Other long term (current) drug therapy: Secondary | ICD-10-CM | POA: Insufficient documentation

## 2013-04-12 DIAGNOSIS — R51 Headache: Secondary | ICD-10-CM | POA: Insufficient documentation

## 2013-04-12 MED ORDER — ONDANSETRON 8 MG PO TBDP
8.0000 mg | ORAL_TABLET | Freq: Once | ORAL | Status: AC
  Administered 2013-04-12: 8 mg via ORAL
  Filled 2013-04-12: qty 1

## 2013-04-12 MED ORDER — HYDROMORPHONE HCL PF 1 MG/ML IJ SOLN
1.0000 mg | Freq: Once | INTRAMUSCULAR | Status: AC
  Administered 2013-04-12: 1 mg via INTRAMUSCULAR
  Filled 2013-04-12: qty 1

## 2013-04-12 NOTE — ED Notes (Signed)
Headache since last night, No HI, alert, nl gait.

## 2013-04-12 NOTE — ED Notes (Signed)
Pt states migraine and neck pain since last night at 2000. Denies vomiting.
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
## 2013-04-13 ENCOUNTER — Encounter (HOSPITAL_COMMUNITY): Payer: Self-pay

## 2013-04-13 ENCOUNTER — Emergency Department (HOSPITAL_COMMUNITY)
Admission: EM | Admit: 2013-04-13 | Discharge: 2013-04-13 | Disposition: A | Discharge status: Home / Self Care | Payer: Medicare Other | Attending: Emergency Medicine | Admitting: Emergency Medicine

## 2013-04-13 DIAGNOSIS — Z992 Dependence on renal dialysis: Secondary | ICD-10-CM | POA: Insufficient documentation

## 2013-04-13 DIAGNOSIS — Z79899 Other long term (current) drug therapy: Secondary | ICD-10-CM | POA: Insufficient documentation

## 2013-04-13 DIAGNOSIS — I120 Hypertensive chronic kidney disease with stage 5 chronic kidney disease or end stage renal disease: Secondary | ICD-10-CM | POA: Insufficient documentation

## 2013-04-13 DIAGNOSIS — M549 Dorsalgia, unspecified: Secondary | ICD-10-CM | POA: Insufficient documentation

## 2013-04-13 DIAGNOSIS — Z7982 Long term (current) use of aspirin: Secondary | ICD-10-CM | POA: Insufficient documentation

## 2013-04-13 DIAGNOSIS — G8929 Other chronic pain: Secondary | ICD-10-CM | POA: Insufficient documentation

## 2013-04-13 DIAGNOSIS — Z9861 Coronary angioplasty status: Secondary | ICD-10-CM | POA: Insufficient documentation

## 2013-04-13 DIAGNOSIS — Z862 Personal history of diseases of the blood and blood-forming organs and certain disorders involving the immune mechanism: Secondary | ICD-10-CM | POA: Insufficient documentation

## 2013-04-13 DIAGNOSIS — G43709 Chronic migraine without aura, not intractable, without status migrainosus: Secondary | ICD-10-CM

## 2013-04-13 DIAGNOSIS — F17200 Nicotine dependence, unspecified, uncomplicated: Secondary | ICD-10-CM | POA: Insufficient documentation

## 2013-04-13 DIAGNOSIS — Z8701 Personal history of pneumonia (recurrent): Secondary | ICD-10-CM | POA: Insufficient documentation

## 2013-04-13 DIAGNOSIS — J449 Chronic obstructive pulmonary disease, unspecified: Secondary | ICD-10-CM | POA: Insufficient documentation

## 2013-04-13 DIAGNOSIS — N186 End stage renal disease: Secondary | ICD-10-CM | POA: Insufficient documentation

## 2013-04-13 DIAGNOSIS — Z8719 Personal history of other diseases of the digestive system: Secondary | ICD-10-CM | POA: Insufficient documentation

## 2013-04-13 DIAGNOSIS — Z8679 Personal history of other diseases of the circulatory system: Secondary | ICD-10-CM | POA: Insufficient documentation

## 2013-04-13 DIAGNOSIS — F209 Schizophrenia, unspecified: Secondary | ICD-10-CM | POA: Insufficient documentation

## 2013-04-13 DIAGNOSIS — Z8639 Personal history of other endocrine, nutritional and metabolic disease: Secondary | ICD-10-CM | POA: Insufficient documentation

## 2013-04-13 DIAGNOSIS — J4489 Other specified chronic obstructive pulmonary disease: Secondary | ICD-10-CM | POA: Insufficient documentation

## 2013-04-13 DIAGNOSIS — F319 Bipolar disorder, unspecified: Secondary | ICD-10-CM | POA: Insufficient documentation

## 2013-04-13 MED ORDER — OXYCODONE-ACETAMINOPHEN 5-325 MG PO TABS
2.0000 | ORAL_TABLET | Freq: Once | ORAL | Status: AC
  Administered 2013-04-13: 2 via ORAL
  Filled 2013-04-13: qty 2

## 2013-04-13 NOTE — ED Notes (Signed)
Complain of migraine. States he went to his PCP Thursday and was given samples of Frova to try. States they did not work.

## 2013-04-13 NOTE — ED Provider Notes (Signed)
Medical screening examination/treatment/procedure(s) were performed by non-physician practitioner and as supervising physician I was immediately available for consultation/collaboration. Klark Vanderhoef, MD, FACEP   Bronco Mcgrory L Dvante Hands, MD 04/13/13 1637 

## 2013-04-13 NOTE — ED Notes (Signed)
Pt states since he is not going to get a shot he does not need to stay for the paper work.
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
## 2013-04-14 ENCOUNTER — Emergency Department (HOSPITAL_COMMUNITY)
Admission: EM | Admit: 2013-04-14 | Discharge: 2013-04-14 | Disposition: A | Discharge status: Home / Self Care | Payer: Medicare Other | Attending: Emergency Medicine | Admitting: Emergency Medicine

## 2013-04-14 ENCOUNTER — Emergency Department (HOSPITAL_COMMUNITY): Payer: Medicare Other

## 2013-04-14 ENCOUNTER — Encounter (HOSPITAL_COMMUNITY): Payer: Self-pay | Admitting: *Deleted

## 2013-04-14 DIAGNOSIS — Z7982 Long term (current) use of aspirin: Secondary | ICD-10-CM | POA: Insufficient documentation

## 2013-04-14 DIAGNOSIS — Y92009 Unspecified place in unspecified non-institutional (private) residence as the place of occurrence of the external cause: Secondary | ICD-10-CM | POA: Insufficient documentation

## 2013-04-14 DIAGNOSIS — J4489 Other specified chronic obstructive pulmonary disease: Secondary | ICD-10-CM | POA: Insufficient documentation

## 2013-04-14 DIAGNOSIS — S7001XD Contusion of right hip, subsequent encounter: Secondary | ICD-10-CM

## 2013-04-14 DIAGNOSIS — Z9861 Coronary angioplasty status: Secondary | ICD-10-CM | POA: Insufficient documentation

## 2013-04-14 DIAGNOSIS — G894 Chronic pain syndrome: Secondary | ICD-10-CM | POA: Insufficient documentation

## 2013-04-14 DIAGNOSIS — R109 Unspecified abdominal pain: Secondary | ICD-10-CM | POA: Insufficient documentation

## 2013-04-14 DIAGNOSIS — W010XXA Fall on same level from slipping, tripping and stumbling without subsequent striking against object, initial encounter: Secondary | ICD-10-CM | POA: Insufficient documentation

## 2013-04-14 DIAGNOSIS — Z79899 Other long term (current) drug therapy: Secondary | ICD-10-CM | POA: Insufficient documentation

## 2013-04-14 DIAGNOSIS — S7000XA Contusion of unspecified hip, initial encounter: Secondary | ICD-10-CM | POA: Insufficient documentation

## 2013-04-14 DIAGNOSIS — J449 Chronic obstructive pulmonary disease, unspecified: Secondary | ICD-10-CM | POA: Insufficient documentation

## 2013-04-14 DIAGNOSIS — Z8639 Personal history of other endocrine, nutritional and metabolic disease: Secondary | ICD-10-CM | POA: Insufficient documentation

## 2013-04-14 DIAGNOSIS — R51 Headache: Secondary | ICD-10-CM

## 2013-04-14 DIAGNOSIS — H53149 Visual discomfort, unspecified: Secondary | ICD-10-CM | POA: Insufficient documentation

## 2013-04-14 DIAGNOSIS — F319 Bipolar disorder, unspecified: Secondary | ICD-10-CM | POA: Insufficient documentation

## 2013-04-14 DIAGNOSIS — F209 Schizophrenia, unspecified: Secondary | ICD-10-CM | POA: Insufficient documentation

## 2013-04-14 DIAGNOSIS — Z862 Personal history of diseases of the blood and blood-forming organs and certain disorders involving the immune mechanism: Secondary | ICD-10-CM | POA: Insufficient documentation

## 2013-04-14 DIAGNOSIS — Z992 Dependence on renal dialysis: Secondary | ICD-10-CM | POA: Insufficient documentation

## 2013-04-14 DIAGNOSIS — Y9389 Activity, other specified: Secondary | ICD-10-CM | POA: Insufficient documentation

## 2013-04-14 DIAGNOSIS — I120 Hypertensive chronic kidney disease with stage 5 chronic kidney disease or end stage renal disease: Secondary | ICD-10-CM | POA: Insufficient documentation

## 2013-04-14 DIAGNOSIS — Z87828 Personal history of other (healed) physical injury and trauma: Secondary | ICD-10-CM | POA: Insufficient documentation

## 2013-04-14 DIAGNOSIS — N186 End stage renal disease: Secondary | ICD-10-CM | POA: Insufficient documentation

## 2013-04-14 DIAGNOSIS — D649 Anemia, unspecified: Secondary | ICD-10-CM | POA: Insufficient documentation

## 2013-04-14 DIAGNOSIS — G43909 Migraine, unspecified, not intractable, without status migrainosus: Secondary | ICD-10-CM | POA: Insufficient documentation

## 2013-04-14 DIAGNOSIS — G8929 Other chronic pain: Secondary | ICD-10-CM | POA: Insufficient documentation

## 2013-04-14 DIAGNOSIS — F17200 Nicotine dependence, unspecified, uncomplicated: Secondary | ICD-10-CM | POA: Insufficient documentation

## 2013-04-14 DIAGNOSIS — Z8701 Personal history of pneumonia (recurrent): Secondary | ICD-10-CM | POA: Insufficient documentation

## 2013-04-14 DIAGNOSIS — Z8679 Personal history of other diseases of the circulatory system: Secondary | ICD-10-CM | POA: Insufficient documentation

## 2013-04-14 MED ORDER — OXYCODONE-ACETAMINOPHEN 5-325 MG PO TABS
2.0000 | ORAL_TABLET | Freq: Once | ORAL | Status: AC
  Administered 2013-04-14: 2 via ORAL
  Filled 2013-04-14: qty 2

## 2013-04-14 NOTE — ED Notes (Signed)
Pt fell yesterday while working out in the yard, landing on right hip, pt c/op right hip pain, migraine headache that started this am,

## 2013-04-14 NOTE — ED Provider Notes (Signed)
Medical screening examination/treatment/procedure(s) were performed by non-physician practitioner and as supervising physician I was immediately available for consultation/collaboration.  Kaydyn Chism S. Versia Mignogna, MD 04/14/13 0431 
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
## 2013-04-15 ENCOUNTER — Encounter (HOSPITAL_COMMUNITY): Payer: Self-pay

## 2013-04-15 ENCOUNTER — Emergency Department (HOSPITAL_COMMUNITY)
Admission: EM | Admit: 2013-04-15 | Discharge: 2013-04-15 | Disposition: A | Discharge status: Home / Self Care | Payer: Medicare Other | Attending: Emergency Medicine | Admitting: Emergency Medicine

## 2013-04-15 DIAGNOSIS — F17200 Nicotine dependence, unspecified, uncomplicated: Secondary | ICD-10-CM | POA: Insufficient documentation

## 2013-04-15 DIAGNOSIS — F209 Schizophrenia, unspecified: Secondary | ICD-10-CM | POA: Insufficient documentation

## 2013-04-15 DIAGNOSIS — J45901 Unspecified asthma with (acute) exacerbation: Secondary | ICD-10-CM | POA: Insufficient documentation

## 2013-04-15 DIAGNOSIS — N186 End stage renal disease: Secondary | ICD-10-CM | POA: Insufficient documentation

## 2013-04-15 DIAGNOSIS — Z8679 Personal history of other diseases of the circulatory system: Secondary | ICD-10-CM | POA: Insufficient documentation

## 2013-04-15 DIAGNOSIS — I120 Hypertensive chronic kidney disease with stage 5 chronic kidney disease or end stage renal disease: Secondary | ICD-10-CM | POA: Insufficient documentation

## 2013-04-15 DIAGNOSIS — Z9861 Coronary angioplasty status: Secondary | ICD-10-CM | POA: Insufficient documentation

## 2013-04-15 DIAGNOSIS — M545 Low back pain, unspecified: Secondary | ICD-10-CM | POA: Insufficient documentation

## 2013-04-15 DIAGNOSIS — R5383 Other fatigue: Secondary | ICD-10-CM | POA: Insufficient documentation

## 2013-04-15 DIAGNOSIS — Z79899 Other long term (current) drug therapy: Secondary | ICD-10-CM | POA: Insufficient documentation

## 2013-04-15 DIAGNOSIS — R109 Unspecified abdominal pain: Secondary | ICD-10-CM | POA: Insufficient documentation

## 2013-04-15 DIAGNOSIS — Z862 Personal history of diseases of the blood and blood-forming organs and certain disorders involving the immune mechanism: Secondary | ICD-10-CM | POA: Insufficient documentation

## 2013-04-15 DIAGNOSIS — F319 Bipolar disorder, unspecified: Secondary | ICD-10-CM | POA: Insufficient documentation

## 2013-04-15 DIAGNOSIS — D649 Anemia, unspecified: Secondary | ICD-10-CM | POA: Insufficient documentation

## 2013-04-15 DIAGNOSIS — Z8701 Personal history of pneumonia (recurrent): Secondary | ICD-10-CM | POA: Insufficient documentation

## 2013-04-15 DIAGNOSIS — J441 Chronic obstructive pulmonary disease with (acute) exacerbation: Secondary | ICD-10-CM | POA: Insufficient documentation

## 2013-04-15 DIAGNOSIS — Z992 Dependence on renal dialysis: Secondary | ICD-10-CM | POA: Insufficient documentation

## 2013-04-15 DIAGNOSIS — R5381 Other malaise: Secondary | ICD-10-CM | POA: Insufficient documentation

## 2013-04-15 DIAGNOSIS — Z87828 Personal history of other (healed) physical injury and trauma: Secondary | ICD-10-CM | POA: Insufficient documentation

## 2013-04-15 DIAGNOSIS — Z8639 Personal history of other endocrine, nutritional and metabolic disease: Secondary | ICD-10-CM | POA: Insufficient documentation

## 2013-04-15 DIAGNOSIS — R51 Headache: Secondary | ICD-10-CM | POA: Insufficient documentation

## 2013-04-15 DIAGNOSIS — G894 Chronic pain syndrome: Secondary | ICD-10-CM | POA: Insufficient documentation

## 2013-04-15 DIAGNOSIS — Z7982 Long term (current) use of aspirin: Secondary | ICD-10-CM | POA: Insufficient documentation

## 2013-04-15 MED ORDER — DIAZEPAM 5 MG PO TABS
5.0000 mg | ORAL_TABLET | Freq: Once | ORAL | Status: AC
  Administered 2013-04-15: 5 mg via ORAL
  Filled 2013-04-15: qty 1

## 2013-04-15 MED ORDER — ONDANSETRON HCL 4 MG PO TABS
4.0000 mg | ORAL_TABLET | Freq: Once | ORAL | Status: AC
  Administered 2013-04-15: 4 mg via ORAL
  Filled 2013-04-15: qty 1

## 2013-04-15 MED ORDER — ACETAMINOPHEN-CODEINE #3 300-30 MG PO TABS
2.0000 | ORAL_TABLET | Freq: Once | ORAL | Status: AC
  Administered 2013-04-15: 2 via ORAL
  Filled 2013-04-15: qty 2

## 2013-04-15 NOTE — ED Provider Notes (Signed)
Medical screening examination/treatment/procedure(s) were performed by non-physician practitioner and as supervising physician I was immediately available for consultation/collaboration.   Dione Booze, MD 04/15/13 1155

## 2013-04-15 NOTE — ED Notes (Signed)
Patient with no complaints at this time. Respirations even and unlabored. Skin warm/dry. Discharge instructions reviewed with patient at this time. Patient given opportunity to voice concerns/ask questions. Patient discharged at this time and left Emergency Department with steady gait.   
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
## 2013-04-15 NOTE — ED Notes (Signed)
Pt reports migraine for 2 days and back pain, which is chronic

## 2013-04-15 NOTE — ED Notes (Addendum)
Discussed w/patient his frequent visits as of late.  States he is to see his pain mgt. MD this Friday and is out of his narcotics.  States he is also to see his PMD soon, and he is going to request increasing his Topamax dose.  On last visit, notes indicate he said he was seen by his PMD on Friday last week and given Frova for migraines.

## 2013-04-16 NOTE — ED Provider Notes (Signed)
Medical screening examination/treatment/procedure(s) were performed by non-physician practitioner and as supervising physician I was immediately available for consultation/collaboration.    [name], MD 04/16/13 [phone]

## 2013-04-17 NOTE — ED Provider Notes (Signed)
Medical screening examination/treatment/procedure(s) were performed by non-physician practitioner and as supervising physician I was immediately available for consultation/collaboration.   [name], MD 04/17/13 [phone]

## 2013-04-19 ENCOUNTER — Emergency Department (HOSPITAL_COMMUNITY): Payer: Medicare Other

## 2013-04-19 ENCOUNTER — Encounter (HOSPITAL_COMMUNITY): Payer: Self-pay | Admitting: Emergency Medicine

## 2013-04-19 ENCOUNTER — Inpatient Hospital Stay (HOSPITAL_COMMUNITY)
Admission: EM | Admit: 2013-04-19 | Discharge: 2013-04-24 | DRG: 682 | Disposition: A | Discharge status: Home / Self Care | Payer: Medicare Other | Attending: Internal Medicine | Admitting: Internal Medicine

## 2013-04-19 DIAGNOSIS — D631 Anemia in chronic kidney disease: Secondary | ICD-10-CM | POA: Diagnosis present

## 2013-04-19 DIAGNOSIS — I120 Hypertensive chronic kidney disease with stage 5 chronic kidney disease or end stage renal disease: Principal | ICD-10-CM | POA: Diagnosis present

## 2013-04-19 DIAGNOSIS — Z9861 Coronary angioplasty status: Secondary | ICD-10-CM

## 2013-04-19 DIAGNOSIS — Z9115 Patient's noncompliance with renal dialysis: Secondary | ICD-10-CM

## 2013-04-19 DIAGNOSIS — I5043 Acute on chronic combined systolic (congestive) and diastolic (congestive) heart failure: Secondary | ICD-10-CM | POA: Diagnosis present

## 2013-04-19 DIAGNOSIS — J810 Acute pulmonary edema: Secondary | ICD-10-CM | POA: Diagnosis present

## 2013-04-19 DIAGNOSIS — I5023 Acute on chronic systolic (congestive) heart failure: Secondary | ICD-10-CM | POA: Diagnosis present

## 2013-04-19 DIAGNOSIS — E785 Hyperlipidemia, unspecified: Secondary | ICD-10-CM | POA: Diagnosis present

## 2013-04-19 DIAGNOSIS — J4489 Other specified chronic obstructive pulmonary disease: Secondary | ICD-10-CM | POA: Diagnosis present

## 2013-04-19 DIAGNOSIS — I255 Ischemic cardiomyopathy: Secondary | ICD-10-CM | POA: Diagnosis present

## 2013-04-19 DIAGNOSIS — N186 End stage renal disease: Secondary | ICD-10-CM | POA: Diagnosis present

## 2013-04-19 DIAGNOSIS — F17200 Nicotine dependence, unspecified, uncomplicated: Secondary | ICD-10-CM | POA: Diagnosis present

## 2013-04-19 DIAGNOSIS — E872 Acidosis, unspecified: Secondary | ICD-10-CM | POA: Diagnosis present

## 2013-04-19 DIAGNOSIS — F319 Bipolar disorder, unspecified: Secondary | ICD-10-CM | POA: Diagnosis present

## 2013-04-19 DIAGNOSIS — D649 Anemia, unspecified: Secondary | ICD-10-CM

## 2013-04-19 DIAGNOSIS — J9601 Acute respiratory failure with hypoxia: Secondary | ICD-10-CM

## 2013-04-19 DIAGNOSIS — J811 Chronic pulmonary edema: Secondary | ICD-10-CM

## 2013-04-19 DIAGNOSIS — I2589 Other forms of chronic ischemic heart disease: Secondary | ICD-10-CM | POA: Diagnosis present

## 2013-04-19 DIAGNOSIS — Z91158 Patient's noncompliance with renal dialysis for other reason: Secondary | ICD-10-CM

## 2013-04-19 DIAGNOSIS — G8929 Other chronic pain: Secondary | ICD-10-CM

## 2013-04-19 DIAGNOSIS — J9600 Acute respiratory failure, unspecified whether with hypoxia or hypercapnia: Secondary | ICD-10-CM | POA: Diagnosis present

## 2013-04-19 DIAGNOSIS — I509 Heart failure, unspecified: Secondary | ICD-10-CM | POA: Diagnosis present

## 2013-04-19 DIAGNOSIS — F209 Schizophrenia, unspecified: Secondary | ICD-10-CM | POA: Diagnosis present

## 2013-04-19 DIAGNOSIS — I214 Non-ST elevation (NSTEMI) myocardial infarction: Secondary | ICD-10-CM | POA: Diagnosis present

## 2013-04-19 DIAGNOSIS — Z992 Dependence on renal dialysis: Secondary | ICD-10-CM

## 2013-04-19 DIAGNOSIS — G894 Chronic pain syndrome: Secondary | ICD-10-CM | POA: Diagnosis present

## 2013-04-19 DIAGNOSIS — I161 Hypertensive emergency: Secondary | ICD-10-CM

## 2013-04-19 DIAGNOSIS — I2510 Atherosclerotic heart disease of native coronary artery without angina pectoris: Secondary | ICD-10-CM | POA: Diagnosis present

## 2013-04-19 DIAGNOSIS — J449 Chronic obstructive pulmonary disease, unspecified: Secondary | ICD-10-CM | POA: Diagnosis present

## 2013-04-19 HISTORY — DX: End stage renal disease: N18.6

## 2013-04-19 LAB — BLOOD GAS, ARTERIAL
Bicarbonate: 21.9 mEq/L (ref 20.0–24.0)
Expiratory PAP: 9
pH, Arterial: 7.364 (ref 7.350–7.450)
pO2, Arterial: 81.1 mmHg (ref 80.0–100.0)

## 2013-04-19 LAB — BASIC METABOLIC PANEL
Chloride: 88 mEq/L — ABNORMAL LOW (ref 96–112)
Creatinine, Ser: 5.82 mg/dL — ABNORMAL HIGH (ref 0.50–1.35)
GFR calc Af Amer: 13 mL/min — ABNORMAL LOW (ref >90)
GFR calc non Af Amer: 11 mL/min — ABNORMAL LOW (ref >90)
Potassium: 5.3 mEq/L — ABNORMAL HIGH (ref 3.5–5.1)

## 2013-04-19 LAB — CBC WITH DIFFERENTIAL/PLATELET
Basophils Absolute: 0 10*3/uL (ref 0.0–0.1)
Basophils Relative: 0 % (ref 0–1)
Eosinophils Absolute: 0 10*3/uL (ref 0.0–0.7)
MCH: 31.4 pg (ref 26.0–34.0)
MCHC: 33.1 g/dL (ref 30.0–36.0)
Neutro Abs: 5.4 10*3/uL (ref 1.7–7.7)
Neutrophils Relative %: 82 % — ABNORMAL HIGH (ref 43–77)
Platelets: 187 10*3/uL (ref 150–400)
RDW: 17.3 % — ABNORMAL HIGH (ref 11.5–15.5)

## 2013-04-19 LAB — URINALYSIS, ROUTINE W REFLEX MICROSCOPIC
Bilirubin Urine: NEGATIVE
Glucose, UA: 100 mg/dL — AB
Ketones, ur: NEGATIVE mg/dL
Leukocytes, UA: NEGATIVE
Protein, ur: 100 mg/dL — AB
pH: 8 (ref 5.0–8.0)

## 2013-04-19 LAB — APTT: aPTT: 43 seconds — ABNORMAL HIGH (ref 24–37)

## 2013-04-19 LAB — URINE MICROSCOPIC-ADD ON

## 2013-04-19 LAB — PROTIME-INR: INR: 1.38 (ref 0.00–1.49)

## 2013-04-19 MED ORDER — SODIUM CHLORIDE 0.9 % IV SOLN
2000.0000 mg | Freq: Once | INTRAVENOUS | Status: AC
  Administered 2013-04-19: 2000 mg via INTRAVENOUS
  Filled 2013-04-19: qty 2000

## 2013-04-19 MED ORDER — ALTEPLASE 2 MG IJ SOLR
2.0000 mg | Freq: Once | INTRAMUSCULAR | Status: AC | PRN
  Filled 2013-04-19: qty 2

## 2013-04-19 MED ORDER — ONDANSETRON HCL 4 MG/2ML IJ SOLN
4.0000 mg | Freq: Four times a day (QID) | INTRAMUSCULAR | Status: DC | PRN
  Administered 2013-04-22: 4 mg via INTRAVENOUS
  Filled 2013-04-19 (×2): qty 2

## 2013-04-19 MED ORDER — LIDOCAINE HCL (PF) 1 % IJ SOLN: 5.0000 mL | INTRAMUSCULAR | Status: DC | PRN

## 2013-04-19 MED ORDER — SODIUM CHLORIDE 0.9 % IV SOLN: 100.0000 mL | INTRAVENOUS | Status: DC | PRN

## 2013-04-19 MED ORDER — ASPIRIN 300 MG RE SUPP
300.0000 mg | Freq: Every day | RECTAL | Status: DC
  Administered 2013-04-20 (×2): 300 mg via RECTAL
  Filled 2013-04-19 (×2): qty 1

## 2013-04-19 MED ORDER — ACETAMINOPHEN 325 MG PO TABS
650.0000 mg | ORAL_TABLET | ORAL | Status: DC | PRN
  Administered 2013-04-20: 650 mg via ORAL
  Filled 2013-04-19: qty 2

## 2013-04-19 MED ORDER — SODIUM CHLORIDE 0.9 % IV SOLN: 250.0000 mL | INTRAVENOUS | Status: DC | PRN

## 2013-04-19 MED ORDER — HEPARIN SODIUM (PORCINE) 1000 UNIT/ML DIALYSIS
1000.0000 [IU] | INTRAMUSCULAR | Status: DC | PRN
  Filled 2013-04-19: qty 1

## 2013-04-19 MED ORDER — NEPRO/CARBSTEADY PO LIQD
237.0000 mL | ORAL | Status: DC | PRN
  Filled 2013-04-19: qty 237

## 2013-04-19 MED ORDER — LIDOCAINE-PRILOCAINE 2.5-2.5 % EX CREA
1.0000 "application " | TOPICAL_CREAM | CUTANEOUS | Status: DC | PRN
  Filled 2013-04-19: qty 5

## 2013-04-19 MED ORDER — LORAZEPAM 2 MG/ML IJ SOLN
1.0000 mg | Freq: Once | INTRAMUSCULAR | Status: AC
  Filled 2013-04-19: qty 1

## 2013-04-19 MED ORDER — LABETALOL HCL 5 MG/ML IV SOLN
20.0000 mg | INTRAVENOUS | Status: DC | PRN
  Administered 2013-04-21 – 2013-04-22 (×5): 20 mg via INTRAVENOUS
  Filled 2013-04-19 (×6): qty 4

## 2013-04-19 MED ORDER — PENTAFLUOROPROP-TETRAFLUOROETH EX AERO: 1.0000 "application " | INHALATION_SPRAY | CUTANEOUS | Status: DC | PRN

## 2013-04-19 MED ORDER — NITROGLYCERIN IN D5W 200-5 MCG/ML-% IV SOLN
5.0000 ug/min | Freq: Once | INTRAVENOUS | Status: AC
  Administered 2013-04-19: 15 ug/min via INTRAVENOUS
  Filled 2013-04-19: qty 250

## 2013-04-19 MED ORDER — HEPARIN SODIUM (PORCINE) 1000 UNIT/ML DIALYSIS
20.0000 [IU]/kg | INTRAMUSCULAR | Status: DC | PRN
  Filled 2013-04-19: qty 2

## 2013-04-19 MED ORDER — PIPERACILLIN-TAZOBACTAM IN DEX 2-0.25 GM/50ML IV SOLN
2.2500 g | Freq: Three times a day (TID) | INTRAVENOUS | Status: DC
  Administered 2013-04-20 – 2013-04-21 (×5): 2.25 g via INTRAVENOUS
  Filled 2013-04-19 (×7): qty 50

## 2013-04-19 MED ORDER — DARBEPOETIN ALFA-POLYSORBATE 100 MCG/0.5ML IJ SOLN
100.0000 ug | INTRAMUSCULAR | Status: DC
  Filled 2013-04-19: qty 0.5

## 2013-04-19 MED ORDER — HEPARIN SODIUM (PORCINE) 5000 UNIT/ML IJ SOLN
5000.0000 [IU] | Freq: Three times a day (TID) | INTRAMUSCULAR | Status: DC
  Administered 2013-04-19 – 2013-04-20 (×2): 5000 [IU] via SUBCUTANEOUS
  Filled 2013-04-19 (×5): qty 1

## 2013-04-19 MED ORDER — SODIUM CHLORIDE 0.9 % IJ SOLN: 3.0000 mL | INTRAMUSCULAR | Status: DC | PRN

## 2013-04-19 MED ORDER — SODIUM CHLORIDE 0.9 % IJ SOLN
3.0000 mL | Freq: Two times a day (BID) | INTRAMUSCULAR | Status: DC
  Administered 2013-04-20: 3 mL via INTRAVENOUS

## 2013-04-19 MED ORDER — FUROSEMIDE 10 MG/ML IJ SOLN
80.0000 mg | Freq: Once | INTRAMUSCULAR | Status: AC
  Administered 2013-04-19: 80 mg via INTRAVENOUS
  Filled 2013-04-19: qty 8

## 2013-04-19 MED ORDER — LORAZEPAM 2 MG/ML IJ SOLN
1.0000 mg | INTRAMUSCULAR | Status: DC | PRN
  Administered 2013-04-20 (×3): 1 mg via INTRAVENOUS
  Filled 2013-04-19: qty 1

## 2013-04-19 MED ORDER — LORAZEPAM 2 MG/ML IJ SOLN
INTRAMUSCULAR | Status: AC
  Administered 2013-04-19: 1 mg via INTRAVENOUS
  Filled 2013-04-19: qty 1

## 2013-04-19 MED ORDER — NITROGLYCERIN IN D5W 200-5 MCG/ML-% IV SOLN
2.0000 ug/min | INTRAVENOUS | Status: DC
  Administered 2013-04-20: 30 ug/min via INTRAVENOUS
  Filled 2013-04-19 (×2): qty 250
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
## 2013-04-19 NOTE — Progress Notes (Signed)
Pt. Admitted to Rm. 3305 from [facility] ED; alert and oriented; pt. On Bipap 100% Fi02; respiratory at bedside and RR nurse called to bedside; dialysis RN on the way.  BP elevated 170's/100's and on Nitro gtt at 50 Mcg.  Pt. Oriented to room and call light within reach; will continue to monitor.  Vivi Martens RN

## 2013-04-19 NOTE — ED Notes (Addendum)
Contact number for pt's father ( step) Gerarda Gunther is [phone] (home)

## 2013-04-19 NOTE — ED Notes (Signed)
CRITICAL VALUE ALERT  Critical value received:  Troponin 1.61  Date of notification:  04/19/2013  Time of notification:  1811  Critical value read back:yes  Nurse who received alert:  Tarri Glenn RN  MD notified (1st page):  Dr Cook/Dr Irene Limbo  Time of first page:  1811  MD notified (2nd page):  Time of second page:  Responding MD:  Dr Cook/ Dr Irene Limbo  Time MD responded:  [phone]

## 2013-04-19 NOTE — Progress Notes (Signed)
Pt agitated and restless. Wants the Bi-pap turned up. Continually moving arm with dialysis needles in it making his pressures max out and the Tx stopping. Dr. Lynda Rainwater at bedside. Pt color appears to be more grayish and resp remain very labored. Care link called to evaluate pt for possible intubation and sedation to be able to complete the much needed dialysis Tx. Shortly after pt was given 1mg  ativan he settled down somewhat and we were able to proceed with hemodialysis Tx without intubating at this time.
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
## 2013-04-19 NOTE — Procedures (Signed)
Patient was seen on dialysis and the procedure was supervised.  BFR 400  Via AVF BP is  high.   Patient appears to be tolerating treatment well- max UF as able  [name] A 04/19/2013
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
## 2013-04-19 NOTE — Progress Notes (Signed)
Pt. Agitated and stating he needs the "Bipap turned up," however his TV is 800's to 900's and his O2 sat. Is 100%; CCM consulted; 1 mg Ativan given and this helped calm him so that dialysis could be continued.  Will continue to monitor.  Vivi Martens RN

## 2013-04-19 NOTE — Progress Notes (Signed)
Interval History: 37 year old man with complex medical history including end-stage renal disease, ischemic cardiomyopathy, COPD and noncompliance transferred her to [facility] from [facility] after presenting to the emergency department with lethargy and shortness of breath. Initial evaluation was notable for marked hypertension, hypoxia and obvious volume overload confirmed by chest x-ray. BiPAP was initiated at [facility] and Nephrology was consulted just prior to transfer. On arrival to [facility] hemodialysis was initiated. Patient was somewhat dyspneic, agitated. CCM was consulted and with adjustments in Bipap and commencement of dialysis patient showed some improvement.   Objective: Vital signs in last 24 hours: Temp:  [93.3 F (34.1 C)-93.5 F (34.2 C)] 93.3 F (34.1 C) (05/02 2048) Pulse Rate:  [32-104] 97 (05/02 2245) Resp:  [22-30] 30 (05/02 2245) BP: (162-189)/(11-114) 177/94 mmHg (05/02 2245) SpO2:  [79 %-100 %] 100 % (05/02 2245) FiO2 (%):  [60 %-100 %] 100 % (05/02 2245) Weight:  [83.9 kg (184 lb 15.5 oz)-99.1 kg (218 lb 7.6 oz)] 99.1 kg (218 lb 7.6 oz) (05/02 2035)  Intake/Output from previous day:   Intake/Output this shift:    BP 165/108  Pulse 102  Temp(Src) 93.3 F (34.1 C) (Rectal)  Resp 29  Ht 5\' 8"  (1.727 m)  Wt 99.1 kg (218 lb 7.6 oz)  BMI 33.23 kg/m2  SpO2 100% General appearance: resting quietly, no longer restless Lungs: diminished breath sounds bilaterally Heart: regular rate and rhythm Extremities: generalized edema/anasarca.  Results for orders placed during the hospital encounter of 04/19/13 (from the past 24 hour(s))  URINALYSIS, ROUTINE W REFLEX MICROSCOPIC     Status: Abnormal   Collection Time    04/19/13  5:10 PM      Result Value Range   Color, Urine YELLOW  YELLOW   APPearance CLEAR  CLEAR   Specific Gravity, Urine 1.015  1.005 - 1.030   pH 8.0  5.0 - 8.0   Glucose, UA 100 (*) NEGATIVE mg/dL   Hgb urine dipstick NEGATIVE  NEGATIVE   Bilirubin Urine  NEGATIVE  NEGATIVE   Ketones, ur NEGATIVE  NEGATIVE mg/dL   Protein, ur 213 (*) NEGATIVE mg/dL   Urobilinogen, UA 0.2  0.0 - 1.0 mg/dL   Nitrite NEGATIVE  NEGATIVE   Leukocytes, UA NEGATIVE  NEGATIVE  URINE MICROSCOPIC-ADD ON     Status: None   Collection Time    04/19/13  5:10 PM      Result Value Range   WBC, UA 0-2  <3 WBC/hpf  CBC WITH DIFFERENTIAL     Status: Abnormal   Collection Time    04/19/13  5:20 PM      Result Value Range   WBC 6.5  4.0 - 10.5 K/uL   RBC 2.99 (*) 4.22 - 5.81 MIL/uL   Hemoglobin 9.4 (*) 13.0 - 17.0 g/dL   HCT 08.6 (*) 57.8 - 46.9 %   MCV 95.0  78.0 - 100.0 fL   MCH 31.4  26.0 - 34.0 pg   MCHC 33.1  30.0 - 36.0 g/dL   RDW 62.9 (*) 52.8 - 41.3 %   Platelets 187  150 - 400 K/uL   Neutrophils Relative 82 (*) 43 - 77 %   Neutro Abs 5.4  1.7 - 7.7 K/uL   Lymphocytes Relative 11 (*) 12 - 46 %   Lymphs Abs 0.7  0.7 - 4.0 K/uL   Monocytes Relative 7  3 - 12 %   Monocytes Absolute 0.5  0.1 - 1.0 K/uL   Eosinophils Relative 0  0 - 5 %   Eosinophils Absolute 0.0  0.0 - 0.7 K/uL   Basophils Relative 0  0 - 1 %   Basophils Absolute 0.0  0.0 - 0.1 K/uL  BASIC METABOLIC PANEL     Status: Abnormal   Collection Time    04/19/13  5:20 PM      Result Value Range   Sodium 132 (*) 135 - 145 mEq/L   Potassium 5.3 (*) 3.5 - 5.1 mEq/L   Chloride 88 (*) 96 - 112 mEq/L   CO2 20  19 - 32 mEq/L   Glucose, Bld 144 (*) 70 - 99 mg/dL   BUN 49 (*) 6 - 23 mg/dL   Creatinine, Ser 1.61 (*) 0.50 - 1.35 mg/dL   Calcium 09.6  8.4 - 04.5 mg/dL   GFR calc non Af Amer 11 (*) >90 mL/min   GFR calc Af Amer 13 (*) >90 mL/min  APTT     Status: Abnormal   Collection Time    04/19/13  5:20 PM      Result Value Range   aPTT 43 (*) 24 - 37 seconds  PROTIME-INR     Status: Abnormal   Collection Time    04/19/13  5:20 PM      Result Value Range   Prothrombin Time 16.6 (*) 11.6 - 15.2 seconds   INR 1.38  0.00 - 1.49  LACTIC ACID, PLASMA     Status: Abnormal   Collection Time     04/19/13  5:20 PM      Result Value Range   Lactic Acid, Venous 4.9 (*) 0.5 - 2.2 mmol/L  TROPONIN I     Status: Abnormal   Collection Time    04/19/13  5:20 PM      Result Value Range   Troponin I 1.61 (*) <0.30 ng/mL  BLOOD GAS, ARTERIAL     Status: Abnormal   Collection Time    04/19/13  6:45 PM      Result Value Range   FIO2 100.00     Delivery systems BILEVEL POSITIVE AIRWAY PRESSURE     Mode OXYGEN MASK     Rate 14     Peep/cpap 9.0     Inspiratory PAP 18     Expiratory PAP 9.0     pH, Arterial 7.364  7.350 - 7.450   pCO2 arterial 39.4  35.0 - 45.0 mmHg   pO2, Arterial 81.1  80.0 - 100.0 mmHg   Bicarbonate 21.9  20.0 - 24.0 mEq/L   TCO2 20.7  0 - 100 mmol/L   Acid-base deficit 2.6 (*) 0.0 - 2.0 mmol/L   O2 Saturation 94.6     Patient temperature 37.0     Collection site RADIAL     Drawn by COLLECTED BY RT     Sample type ARTERIAL     Allens test (pass/fail) PASS  PASS    Studies/Results: Dg Hip Complete Right  04/14/2013  *RADIOLOGY REPORT*  Clinical Data: 37 year old male with right hip pain following injury.  RIGHT HIP - COMPLETE 2+ VIEW  Comparison: None  Findings: There is no evidence of fracture, subluxation, or dislocation. No focal bony lesions are present. The joint spaces are unremarkable. Atherosclerotic vascular calcifications are identified, advanced for age.  IMPRESSION: No evidence of acute bony abnormality.  Atherosclerotic vascular calcifications, advanced for age.   Original Report Authenticated By: Harmon Pier, M.D.    Dg Chest Portable 1 View  04/19/2013  *RADIOLOGY REPORT*  Clinical Data:  Altered mental status  PORTABLE CHEST - 1 VIEW  Comparison: 03/21/2013  Findings: Cardiomegaly again noted.  There is central vascular congestion and bilateral airspace disease highly suspicious for bilateral pulmonary edema.  Superimposed infiltrate cannot be excluded.  Clinical correlation is necessary.  IMPRESSION:  There is central vascular congestion and bilateral  airspace disease highly suspicious for bilateral pulmonary edema. Superimposed infiltrate cannot be excluded.  Clinical correlation is necessary.   Original Report Authenticated By: Natasha Mead, M.D.    Dg Chest Port 1 View  03/21/2013  *RADIOLOGY REPORT*  Clinical Data: Chest pain.  Shortness of breath.  COPD.  PORTABLE CHEST - 1 VIEW  Comparison: 02/21/2013  Findings: Cardiomegaly stable.  Thoracic kyphosis and low lung volumes again seen.  There is increased diffuse interstitial infiltrates seen bilaterally suspicious for diffuse pulmonary edema.  No evidence of focal consolidation or significant pleural effusion.  IMPRESSION: Stable cardiomegaly.  Increased diffuse interstitial infiltrates, suspicious for acute pulmonary edema.   Original Report Authenticated By: Myles Rosenthal, M.D.     Scheduled Meds: . aspirin  300 mg Rectal Daily  . [START ON 04/20/2013] darbepoetin (ARANESP) injection - DIALYSIS  100 mcg Intravenous Q Sat-HD  . heparin  5,000 Units Subcutaneous Q8H  . LORazepam  1 mg Intravenous Once  . piperacillin-tazobactam (ZOSYN)  IV  2.25 g Intravenous Q8H  . sodium chloride  3 mL Intravenous Q12H  . vancomycin  2,000 mg Intravenous Once   Continuous Infusions: . nitroGLYCERIN 30 mcg/min (04/19/13 2131)   PRN Meds:sodium chloride, sodium chloride, sodium chloride, acetaminophen, alteplase, feeding supplement (NEPRO CARB STEADY), heparin, heparin, labetalol, lidocaine (PF), lidocaine-prilocaine, LORazepam, ondansetron (ZOFRAN) IV, pentafluoroprop-tetrafluoroeth, sodium chloride  Assessment/Plan: Acute hypoxic respiratory failure: Secondary to pulmonary edema and massive volume overload. Receiving emergent hemodialysis. BiPAP. CCM has evaluated patient and made adjustments in Bipap settings.  Acute systolic heart failure with massive volume overload/anasarca: Receiving emergent dialysis. Cardiology consulted.   Hypertensive emergency: Continue nitroglycerin infusion. Likely to improve  with dialysis.   Elevated troponin without EKG changes: Suspect demand ischemia secondary to pulmonary edema but cannot exclude ACS. Serial cardiac enzymes. Cardiology consultation.   Concern for sepsis with Lactic acidosis and hypothermia: CCM has evaluated patient and started abx (Vancomycin/Zosyn). Possibly underlying HCAP.    LOS: 0 days   [name] Bertram A.

## 2013-04-19 NOTE — ED Notes (Signed)
Pt speech slurred/drooling and face swollen. Pt pale and dusky-states "I'm hurting". Family reports last dialysis yesterday.

## 2013-04-20 ENCOUNTER — Inpatient Hospital Stay (HOSPITAL_COMMUNITY): Payer: Medicare Other

## 2013-04-20 DIAGNOSIS — R7989 Other specified abnormal findings of blood chemistry: Secondary | ICD-10-CM

## 2013-04-20 DIAGNOSIS — I10 Essential (primary) hypertension: Secondary | ICD-10-CM

## 2013-04-20 DIAGNOSIS — I2589 Other forms of chronic ischemic heart disease: Secondary | ICD-10-CM

## 2013-04-20 DIAGNOSIS — J9600 Acute respiratory failure, unspecified whether with hypoxia or hypercapnia: Secondary | ICD-10-CM

## 2013-04-20 DIAGNOSIS — A419 Sepsis, unspecified organism: Secondary | ICD-10-CM

## 2013-04-20 DIAGNOSIS — J811 Chronic pulmonary edema: Secondary | ICD-10-CM

## 2013-04-20 DIAGNOSIS — N186 End stage renal disease: Secondary | ICD-10-CM

## 2013-04-20 LAB — RENAL FUNCTION PANEL
CO2: 24 mEq/L (ref 19–32)
Calcium: 10.1 mg/dL (ref 8.4–10.5)
Chloride: 92 mEq/L — ABNORMAL LOW (ref 96–112)
Creatinine, Ser: 4.56 mg/dL — ABNORMAL HIGH (ref 0.50–1.35)
GFR calc Af Amer: 17 mL/min — ABNORMAL LOW (ref >90)
GFR calc non Af Amer: 15 mL/min — ABNORMAL LOW (ref >90)
Glucose, Bld: 118 mg/dL — ABNORMAL HIGH (ref 70–99)
Sodium: 136 mEq/L (ref 135–145)

## 2013-04-20 LAB — CBC
MCHC: 33.5 g/dL (ref 30.0–36.0)
Platelets: 175 10*3/uL (ref 150–400)
RDW: 17.1 % — ABNORMAL HIGH (ref 11.5–15.5)
WBC: 10 10*3/uL (ref 4.0–10.5)

## 2013-04-20 LAB — CREATININE, SERUM
GFR calc Af Amer: 22 mL/min — ABNORMAL LOW (ref >90)
GFR calc non Af Amer: 19 mL/min — ABNORMAL LOW (ref >90)

## 2013-04-20 LAB — HEPATITIS B SURFACE ANTIGEN: Hepatitis B Surface Ag: NEGATIVE

## 2013-04-20 MED ORDER — WHITE PETROLATUM GEL
Status: AC
  Administered 2013-04-20: 0.2
  Filled 2013-04-20: qty 5

## 2013-04-20 MED ORDER — HEPARIN (PORCINE) IN NACL 100-0.45 UNIT/ML-% IJ SOLN
2650.0000 [IU]/h | INTRAMUSCULAR | Status: DC
  Administered 2013-04-20: 1150 [IU]/h via INTRAVENOUS
  Administered 2013-04-21: 1400 [IU]/h via INTRAVENOUS
  Administered 2013-04-21: 2000 [IU]/h via INTRAVENOUS
  Filled 2013-04-20 (×6): qty 250

## 2013-04-20 MED ORDER — RENA-VITE PO TABS
1.0000 | ORAL_TABLET | Freq: Every day | ORAL | Status: DC
  Administered 2013-04-20 – 2013-04-23 (×4): 1 via ORAL
  Filled 2013-04-20 (×5): qty 1

## 2013-04-20 MED ORDER — HYDRALAZINE HCL 20 MG/ML IJ SOLN
5.0000 mg | Freq: Three times a day (TID) | INTRAMUSCULAR | Status: DC
  Administered 2013-04-20: 5 mg via INTRAVENOUS
  Filled 2013-04-20: qty 1

## 2013-04-20 MED ORDER — LORAZEPAM 2 MG/ML IJ SOLN
1.0000 mg | INTRAMUSCULAR | Status: DC
  Administered 2013-04-20 – 2013-04-21 (×4): 1 mg via INTRAVENOUS
  Filled 2013-04-20 (×5): qty 1

## 2013-04-20 MED ORDER — CLONIDINE HCL 0.2 MG PO TABS
0.2000 mg | ORAL_TABLET | Freq: Two times a day (BID) | ORAL | Status: DC
  Administered 2013-04-20 – 2013-04-21 (×2): 0.2 mg via ORAL
  Filled 2013-04-20 (×3): qty 1

## 2013-04-20 MED ORDER — HEPARIN BOLUS VIA INFUSION
2000.0000 [IU] | INTRAVENOUS | Status: AC
  Administered 2013-04-20: 2000 [IU] via INTRAVENOUS
  Filled 2013-04-20: qty 2000

## 2013-04-20 MED ORDER — METOPROLOL TARTRATE 1 MG/ML IV SOLN
5.0000 mg | Freq: Every day | INTRAVENOUS | Status: DC
  Administered 2013-04-20: 5 mg via INTRAVENOUS
  Filled 2013-04-20: qty 5

## 2013-04-20 MED ORDER — CARVEDILOL 12.5 MG PO TABS
12.5000 mg | ORAL_TABLET | Freq: Two times a day (BID) | ORAL | Status: DC
  Administered 2013-04-20 – 2013-04-21 (×2): 12.5 mg via ORAL
  Filled 2013-04-20 (×4): qty 1

## 2013-04-20 MED ORDER — METOPROLOL TARTRATE 1 MG/ML IV SOLN
5.0000 mg | INTRAVENOUS | Status: DC
  Filled 2013-04-20: qty 5

## 2013-04-20 MED ORDER — PANTOPRAZOLE SODIUM 40 MG PO TBEC
40.0000 mg | DELAYED_RELEASE_TABLET | Freq: Every day | ORAL | Status: DC
  Administered 2013-04-21 – 2013-04-24 (×3): 40 mg via ORAL
  Filled 2013-04-20 (×3): qty 1

## 2013-04-20 MED ORDER — PANTOPRAZOLE SODIUM 40 MG IV SOLR
40.0000 mg | INTRAVENOUS | Status: DC
  Administered 2013-04-20: 40 mg via INTRAVENOUS
  Filled 2013-04-20: qty 40

## 2013-04-20 MED ORDER — OLANZAPINE 10 MG PO TABS
10.0000 mg | ORAL_TABLET | Freq: Two times a day (BID) | ORAL | Status: DC
  Administered 2013-04-20 – 2013-04-24 (×7): 10 mg via ORAL
  Filled 2013-04-20 (×10): qty 1

## 2013-04-20 MED ORDER — DARBEPOETIN ALFA-POLYSORBATE 100 MCG/0.5ML IJ SOLN
INTRAMUSCULAR | Status: AC
  Administered 2013-04-20: 100 ug via INTRAVENOUS
  Filled 2013-04-20: qty 0.5

## 2013-04-20 MED ORDER — DULOXETINE HCL 60 MG PO CPEP
60.0000 mg | ORAL_CAPSULE | Freq: Every day | ORAL | Status: DC
  Administered 2013-04-20 – 2013-04-24 (×5): 60 mg via ORAL
  Filled 2013-04-20 (×5): qty 1

## 2013-04-20 MED ORDER — ASPIRIN 325 MG PO TABS
325.0000 mg | ORAL_TABLET | Freq: Every day | ORAL | Status: DC
  Administered 2013-04-21 – 2013-04-24 (×3): 325 mg via ORAL
  Filled 2013-04-20 (×4): qty 1

## 2013-04-20 MED ORDER — LISINOPRIL 20 MG PO TABS
20.0000 mg | ORAL_TABLET | Freq: Two times a day (BID) | ORAL | Status: DC
  Administered 2013-04-20 – 2013-04-24 (×7): 20 mg via ORAL
  Filled 2013-04-20 (×9): qty 1

## 2013-04-20 MED ORDER — HYDRALAZINE HCL 50 MG PO TABS
50.0000 mg | ORAL_TABLET | Freq: Three times a day (TID) | ORAL | Status: DC
  Administered 2013-04-20 – 2013-04-21 (×4): 50 mg via ORAL
  Filled 2013-04-20 (×7): qty 1

## 2013-04-20 MED ORDER — VANCOMYCIN HCL IN DEXTROSE 1-5 GM/200ML-% IV SOLN
1000.0000 mg | Freq: Once | INTRAVENOUS | Status: AC
  Administered 2013-04-20: 1000 mg via INTRAVENOUS
  Filled 2013-04-20: qty 200

## 2013-04-20 MED ORDER — ACETAMINOPHEN 650 MG RE SUPP
325.0000 mg | Freq: Four times a day (QID) | RECTAL | Status: DC | PRN
  Administered 2013-04-20: 325 mg via RECTAL
  Filled 2013-04-20: qty 1

## 2013-04-20 NOTE — Care Management (Signed)
Cm spoke with patient concerning CM consult for HF home screen. Pt receiving dialysis. Cm left HH agency list for pt at bedside. Pt states living home with family. PCP: Isabella Stalling, MD    Leonie Green [phone]

## 2013-04-20 NOTE — Progress Notes (Signed)
Spoke with [name] on call regarding patient's bp goal; bp is currently systolic in the 160's to 170's; he is on a Nitro gtt at 30 mcg/hr; told to keep rate as is for now; will continue to monitor.  Vivi Martens RN

## 2013-04-20 NOTE — Progress Notes (Addendum)
CRITICAL VALUE ALERT  Critical value received: Troponin 4.21  Date of notification:  04/20/13  Time of notification:  01:30  Critical value read back:yes  Nurse who received alert:  Vivi Martens RN   MD notified (1st page):  Burnadette Peter   Time of first page:  01:31  MD notified (2nd page):  Time of second page:  Responding MD:  Burnadette Peter   Time MD responded:  01:32  No new orders at this time
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
## 2013-04-20 NOTE — Progress Notes (Signed)
Subjective:  More comfortable but still on bipap- HD with 6 liters removed but still a long way to go.  Pt still unpredictable in his movements but AVF is OK in spite of his agitation last night Objective Vital signs in last 24 hours: Filed Vitals:   04/20/13 0430 04/20/13 0445 04/20/13 0530 04/20/13 0747  BP: 180/114 162/109 162/97 168/96  Pulse: 120 100 101 108  Temp:   102.3 F (39.1 C)   TempSrc:   Rectal   Resp: 26 15 22 22   Height:      Weight:      SpO2: 100% 100% 100% 100%   Weight change:   Intake/Output Summary (Last 24 hours) at 04/20/13 0753 Last data filed at 04/20/13 0600  Gross per 24 hour  Intake 185.35 ml  Output   6131 ml  Net -5945.65 ml   Labs: Basic Metabolic Panel:  Recent Labs Lab 04/19/13 1720 04/20/13 0240  NA 132*  --   K 5.3*  --   CL 88*  --   CO2 20  --   GLUCOSE 144*  --   BUN 49*  --   CREATININE 5.82* 3.80*  CALCIUM 10.2  --    Liver Function Tests: No results found for this basename: AST, ALT, ALKPHOS, BILITOT, PROT, ALBUMIN,  in the last 168 hours No results found for this basename: LIPASE, AMYLASE,  in the last 168 hours No results found for this basename: AMMONIA,  in the last 168 hours CBC:  Recent Labs Lab 04/19/13 1720 04/20/13 0240  WBC 6.5 10.0  NEUTROABS 5.4  --   HGB 9.4* 9.2*  HCT 28.4* 27.5*  MCV 95.0 90.2  PLT 187 175   Cardiac Enzymes:  Recent Labs Lab 04/19/13 1720 04/20/13 0137 04/20/13 0645  TROPONINI 1.61* 4.21* 6.03*   CBG: No results found for this basename: GLUCAP,  in the last 168 hours  Iron Studies: No results found for this basename: IRON, TIBC, TRANSFERRIN, FERRITIN,  in the last 72 hours Studies/Results: Dg Chest Port 1 View  04/20/2013  *RADIOLOGY REPORT*  Clinical Data:  Respiratory failure  PORTABLE CHEST - 1 VIEW  Comparison: 04/19/2013  Findings: Marked cardiomegaly/cardiac silhouette enlargement. Aortic atherosclerosis. Hilar prominence may reflect central vascular congestion.   Bilateral airspace opacities shows some interval improvement with right perihilar fullness persisting. Small effusions not excluded.  No pneumothorax.  Periphery of the no acute osseous finding.  IMPRESSION: Some interval improved aeration since the prior, with persistent multifocal airspace opacity; asymmetric edema versus multifocal pneumonia.  Persistent cardiomegaly and hilar prominence.   Original Report Authenticated By: Jearld Lesch, M.D.    Dg Chest Portable 1 View  04/19/2013  *RADIOLOGY REPORT*  Clinical Data: Altered mental status  PORTABLE CHEST - 1 VIEW  Comparison: 03/21/2013  Findings: Cardiomegaly again noted.  There is central vascular congestion and bilateral airspace disease highly suspicious for bilateral pulmonary edema.  Superimposed infiltrate cannot be excluded.  Clinical correlation is necessary.  IMPRESSION:  There is central vascular congestion and bilateral airspace disease highly suspicious for bilateral pulmonary edema. Superimposed infiltrate cannot be excluded.  Clinical correlation is necessary.   Original Report Authenticated By: Natasha Mead, M.D.    Medications: Infusions: . nitroGLYCERIN 30 mcg/min (04/19/13 2131)    Scheduled Medications: . aspirin  300 mg Rectal Daily  . darbepoetin (ARANESP) injection - DIALYSIS  100 mcg Intravenous Q Sat-HD  . heparin  5,000 Units Subcutaneous Q8H  . LORazepam  1 mg Intravenous Once  .  piperacillin-tazobactam (ZOSYN)  IV  2.25 g Intravenous Q8H  . sodium chloride  3 mL Intravenous Q12H    have reviewed scheduled and prn medications.  Physical Exam: General: massively volume overloaded- still on bipap and unpredictable with his movements Heart: RRR with occas tachy episodes Lungs: CBS bilat Abdomen: soft, non tender Extremities: pitting edema Dialysis Access: left upper arm AVF- good thrill and bruit- no infiltration !  Assessment/Plan: 37 year old WM with ESRD and cardiomyopathy with medical noncompliance-  has  massive anasarca and is requiring bipap and has positive troponins  1 Very impressive volume overload-  6 liters off last night with still very far to go. He needs aggressive daily dialysis with aim of aggressive volume removal. Positive troponins per cards - just stress of too much volume or actual ischemia?  2 ESRD: noncompliance to the extreme- aggressive daily HD and once out of woods need to really try to get to the bottom of why he is so noncompliant. He likely is not a candidate for aggressive care in the future if he is going to continue to behave like this. Is this his psych diagnosis that is driving this behavior ? Consider palliative care consult once more stable.  3 Hypertension: malignant due to volume- has many meds for BP but suspect that he would need much less if he had volume control  4. Anemia of ESRD: hgb in the 9's will add aranesp and check iron stores  5. Metabolic Bone Disease: renvela once eating- check PTH and phos 6. Possible sepsis/PNA- cultures drawn and vanc and zosyn added- temp has been up and down- CCM on board.      [name] A   04/20/2013,7:53 AM  LOS: 1 day
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
## 2013-04-20 NOTE — Progress Notes (Signed)
Elevated troponin; increasing compared to earlier valaue;   Repeat EKG; cardiology following;

## 2013-04-20 NOTE — Progress Notes (Signed)
Subjective: Patient complains of hurting all over  Chest hurts  Worse with breath. Objective: Filed Vitals:   04/20/13 0400 04/20/13 0430 04/20/13 0445 04/20/13 0530  BP: 164/99 180/114 162/109 162/97  Pulse: 117 120 100 101  Temp:    102.3 F (39.1 C)  TempSrc:    Rectal  Resp: 24 26 15 22   Height:      Weight:      SpO2: 99% 100% 100% 100%   Weight change:   Intake/Output Summary (Last 24 hours) at 04/20/13 0744 Last data filed at 04/20/13 0600  Gross per 24 hour  Intake 185.35 ml  Output   6131 ml  Net -5945.65 ml    General: Curled in ball with BiPAP on   Moaning with pain. Neck:  Unable to assess JVP Heart: Regular rate and rhythm, without murmurs, rubs, gallops.  Distant Lungs: Coarse rhonchi Exemities: 2+ edema   TEle:  SR to ST  HR 90s to 130s  Currently 90s.   Lab Results: Results for orders placed during the hospital encounter of 04/19/13 (from the past 24 hour(s))  URINALYSIS, ROUTINE W REFLEX MICROSCOPIC     Status: Abnormal   Collection Time    04/19/13  5:10 PM      Result Value Range   Color, Urine YELLOW  YELLOW   APPearance CLEAR  CLEAR   Specific Gravity, Urine 1.015  1.005 - 1.030   pH 8.0  5.0 - 8.0   Glucose, UA 100 (*) NEGATIVE mg/dL   Hgb urine dipstick NEGATIVE  NEGATIVE   Bilirubin Urine NEGATIVE  NEGATIVE   Ketones, ur NEGATIVE  NEGATIVE mg/dL   Protein, ur 409 (*) NEGATIVE mg/dL   Urobilinogen, UA 0.2  0.0 - 1.0 mg/dL   Nitrite NEGATIVE  NEGATIVE   Leukocytes, UA NEGATIVE  NEGATIVE  URINE MICROSCOPIC-ADD ON     Status: None   Collection Time    04/19/13  5:10 PM      Result Value Range   WBC, UA 0-2  <3 WBC/hpf  CBC WITH DIFFERENTIAL     Status: Abnormal   Collection Time    04/19/13  5:20 PM      Result Value Range   WBC 6.5  4.0 - 10.5 K/uL   RBC 2.99 (*) 4.22 - 5.81 MIL/uL   Hemoglobin 9.4 (*) 13.0 - 17.0 g/dL   HCT 81.1 (*) 91.4 - 78.2 %   MCV 95.0  78.0 - 100.0 fL   MCH 31.4  26.0 - 34.0 pg   MCHC 33.1  30.0 - 36.0 g/dL    RDW 95.6 (*) 21.3 - 15.5 %   Platelets 187  150 - 400 K/uL   Neutrophils Relative 82 (*) 43 - 77 %   Neutro Abs 5.4  1.7 - 7.7 K/uL   Lymphocytes Relative 11 (*) 12 - 46 %   Lymphs Abs 0.7  0.7 - 4.0 K/uL   Monocytes Relative 7  3 - 12 %   Monocytes Absolute 0.5  0.1 - 1.0 K/uL   Eosinophils Relative 0  0 - 5 %   Eosinophils Absolute 0.0  0.0 - 0.7 K/uL   Basophils Relative 0  0 - 1 %   Basophils Absolute 0.0  0.0 - 0.1 K/uL  BASIC METABOLIC PANEL     Status: Abnormal   Collection Time    04/19/13  5:20 PM      Result Value Range   Sodium 132 (*) 135 - 145 mEq/L  Potassium 5.3 (*) 3.5 - 5.1 mEq/L   Chloride 88 (*) 96 - 112 mEq/L   CO2 20  19 - 32 mEq/L   Glucose, Bld 144 (*) 70 - 99 mg/dL   BUN 49 (*) 6 - 23 mg/dL   Creatinine, Ser 1.61 (*) 0.50 - 1.35 mg/dL   Calcium 09.6  8.4 - 04.5 mg/dL   GFR calc non Af Amer 11 (*) >90 mL/min   GFR calc Af Amer 13 (*) >90 mL/min  APTT     Status: Abnormal   Collection Time    04/19/13  5:20 PM      Result Value Range   aPTT 43 (*) 24 - 37 seconds  PROTIME-INR     Status: Abnormal   Collection Time    04/19/13  5:20 PM      Result Value Range   Prothrombin Time 16.6 (*) 11.6 - 15.2 seconds   INR 1.38  0.00 - 1.49  LACTIC ACID, PLASMA     Status: Abnormal   Collection Time    04/19/13  5:20 PM      Result Value Range   Lactic Acid, Venous 4.9 (*) 0.5 - 2.2 mmol/L  TROPONIN I     Status: Abnormal   Collection Time    04/19/13  5:20 PM      Result Value Range   Troponin I 1.61 (*) <0.30 ng/mL  BLOOD GAS, ARTERIAL     Status: Abnormal   Collection Time    04/19/13  6:45 PM      Result Value Range   FIO2 100.00     Delivery systems BILEVEL POSITIVE AIRWAY PRESSURE     Mode OXYGEN MASK     Rate 14     Peep/cpap 9.0     Inspiratory PAP 18     Expiratory PAP 9.0     pH, Arterial 7.364  7.350 - 7.450   pCO2 arterial 39.4  35.0 - 45.0 mmHg   pO2, Arterial 81.1  80.0 - 100.0 mmHg   Bicarbonate 21.9  20.0 - 24.0 mEq/L   TCO2  20.7  0 - 100 mmol/L   Acid-base deficit 2.6 (*) 0.0 - 2.0 mmol/L   O2 Saturation 94.6     Patient temperature 37.0     Collection site RADIAL     Drawn by COLLECTED BY RT     Sample type ARTERIAL     Allens test (pass/fail) PASS  PASS  MRSA PCR SCREENING     Status: None   Collection Time    04/19/13  8:31 PM      Result Value Range   MRSA by PCR NEGATIVE  NEGATIVE  HEPATITIS B SURFACE ANTIGEN     Status: None   Collection Time    04/19/13 10:12 PM      Result Value Range   Hepatitis B Surface Ag NEGATIVE  NEGATIVE  TROPONIN I     Status: Abnormal   Collection Time    04/20/13  1:37 AM      Result Value Range   Troponin I 4.21 (*) <0.30 ng/mL  CBC     Status: Abnormal   Collection Time    04/20/13  2:40 AM      Result Value Range   WBC 10.0  4.0 - 10.5 K/uL   RBC 3.05 (*) 4.22 - 5.81 MIL/uL   Hemoglobin 9.2 (*) 13.0 - 17.0 g/dL   HCT 40.9 (*) 81.1 - 91.4 %   MCV 90.2  78.0 - 100.0 fL   MCH 30.2  26.0 - 34.0 pg   MCHC 33.5  30.0 - 36.0 g/dL   RDW 16.1 (*) 09.6 - 04.5 %   Platelets 175  150 - 400 K/uL  CREATININE, SERUM     Status: Abnormal   Collection Time    04/20/13  2:40 AM      Result Value Range   Creatinine, Ser 3.80 (*) 0.50 - 1.35 mg/dL   GFR calc non Af Amer 19 (*) >90 mL/min   GFR calc Af Amer 22 (*) >90 mL/min  TROPONIN I     Status: Abnormal   Collection Time    04/20/13  6:45 AM      Result Value Range   Troponin I 6.03 (*) <0.30 ng/mL    Studies/Results: @[id number] @  Medications: Reviewed   @PROBHOSP @  I just saw patient in early May at [facility]  He was admitted there with volume overload due to noncompliance with dialysis and diet. Represents with volume increase.  Troponin is abnormal.  History of CAD with remote stent  Patient with cardiomyopathy  LVEF 30 to 35%. Note on echo at [facility] there was an echodensity in LA that was difficult to define.  TEE was not done  Recomm   CHF Patient needs continued daily dialysis due to marked  volume overload Would continue medical RX Give lopressor  Continue NTG  Add hydralazine.    CAD  Patient with increased troponin Was comfortable this AM  Now with diffuse pain May represent demand ischemia in setting of profound volume increase.  For now would continue dialysis to improve volume  Reassess once volume status improved Would repeat EKG   Would start lopressor IV every 4 hours until taking PO Continue NTG IV Would start heparin for now  Abnormal echo.  As noted above.  Needs to improve clinically before evaluating  Before ordering TEE would do limited TTE to reeval but only when breathing and volume improved.  ESRD  Per renal service  HTN  DIalysis  Meds  HL  Continue statin     LOS: 1 day   Dietrich Pates 04/20/2013, 7:44 AM

## 2013-04-20 NOTE — Progress Notes (Signed)
Patient restless throughout hemodialysis treatment. Patient encouraged to lay flat and still to avoid needle infiltration. Patient did not adhere to instructions. Ativan 1mg  given for anxiety. Patient stated that he was cramping and wanted to terminate his treatment early. Dr Kathrene Bongo made aware of patient's request to sign off AMA. Dr Kathrene Bongo has been informed of 4.3liter fluid removal. Patient has signed AMA and treatment terminated

## 2013-04-20 NOTE — Progress Notes (Signed)
Dr. Orvis Brill aware of Pt's heart rate jumping to 150 occasionally and then returning to baseline; will continue to monitor.  Lynch made aware of patient's rectal temp which is now 102.3; will continue to monitor.  Vivi Martens RN

## 2013-04-20 NOTE — Progress Notes (Signed)
[facility] Progress Note [name] - Stepdown/ICU TEAM   [name] ZOX:[id number] DOB: 1976/04/30 DOA: 04/19/2013 PCP: Isabella Stalling, MD  Brief narrative: 37 year old man returned to the emergency department with lethargy and shortness of breath. Initial evaluation was notable for marked hypertension, hypoxia and obvious volume overload confirmed with chest x-ray. BiPAP was initiated and dialysis was planned while blood work and further testing was in process. Hospitalists were consulted at that time for further recommendations.  History obtained from chart and EDP. Has complex medical history including end-stage renal disease, ischemic cardiomyopathy, COPD and noncompliance. Patient unable to provide any history. By report he had hemodialysis 5/[name]. He was brought to the emergency department for increasing shortness of breath, facial and leg swelling. Family reported patient was lethargic and simply sat in a chair most of the day.  Chart review is notable for 3 admissions in the last 6 months. Long history of noncompliance medically. Sometimes ends dialysis early and is noncompliant with fluid intake.  After Hospitalist's initial evaluation further information became available including troponin [name].61, lactic acid 4.9. Given his complex history and known heart disease Cardiology consultation is warranted and plans were made for transfer to [facility].   Assessment/Plan:  Acute hypoxic respiratory failure Secondary to pulmonary edema and massive volume overload - ongoing emergent hemodialysis - BiPAP  Acute systolic just heart failure with massive volume overload/anasarca Emergent hemodialysis - manage blood pressure - nitroglycerin infusion, but initiate oral meds with hopes to transition off gtt soon   Hypertensive emergency Continue nitroglycerin infusion - Labetalol PRN - HD will be primary tx regimen  Elevated troponin without EKG changes Suspect demand ischemia secondary to  pulmonary edema but cannot exclude ACS - serial cardiac enzymes climbing - Cardiology following - Aspirin   Lactic acidosis hypoperfusion secondary to volume overload rather than sepsis - if fails to improve with hemodialysis consider further evaluation  End-stage renal disease on hemodialysis Tuesday Thursday Saturday Nephrology following  Ischemic cardiomyopathy plan as above - stent to circumflex and RCA [name]/2010 - Left ventricular ejection fraction 30-35% 03/2013  ?echodensity in chordal region of posterior mitral leaflet + possible LA mass Will need further evaluation once more stable - see Cards notes  History of COPD No evidence of acute exacerbation at this time  History of noncompliance As documented in history of present illness   Code Status: FULL Family Communication: no family present at time of exam Disposition Plan: SDU  Consultants: Nephrology PCCM Cardiology  Procedures: none  Antibiotics: Zosyn 5/2 >> Vanc 5/2>>  DVT prophylaxis: heparin  HPI/Subjective: Patient is awake and interactive.  He is anxious to begin a diet.  He currently denies chest pain shortness of breath or abdominal pain.  Objective: Blood pressure 161/92, pulse 96, temperature 98.4 F (36.9 C), temperature source Axillary, resp. rate 20, height 5\' 8"  ([name].727 m), weight 92.8 kg (204 lb 9.4 oz), SpO2 100.00%.  Intake/Output Summary (Last 24 hours) at 04/20/13 1347 Last data filed at 04/20/13 1300  Gross per 24 hour  Intake 785.92 ml  Output   6192 ml  Net -5406.08 ml   Exam: General: tolerating BIPAP w/o difficulty at present  Lungs: diffuse crackles - no wheeze  Cardiovascular: distant hs - regular rate and rythmn Abdomen: Nontender, nondistended, soft, bowel sounds positive, no rebound, no ascites, no appreciable mass Extremities: anasarca Data Reviewed: Basic Metabolic Panel:  Recent Labs Lab 04/19/13 1720 04/20/13 0240  NA 132*  --   K 5.3*  --  CL 88*  --   CO2  20  --   GLUCOSE 144*  --   BUN 49*  --   CREATININE 5.82* 3.80*  CALCIUM 10.2  --    CBC:  Recent Labs Lab 04/19/13 1720 04/20/13 0240  WBC 6.5 10.0  NEUTROABS 5.4  --   HGB 9.4* 9.2*  HCT 28.4* 27.5*  MCV 95.0 90.2  PLT 187 175   Cardiac Enzymes:  Recent Labs Lab 04/19/13 1720 04/20/13 0137 04/20/13 0645  TROPONINI [name].61* 4.21* 6.03*   BNP (last 3 results)  Recent Labs  02/18/13 1402 03/21/13 1843  PROBNP >70000.0* >70000.0*    Recent Results (from the past 240 hour(s))  MRSA PCR SCREENING     Status: None   Collection Time    04/19/13  8:31 PM      Result Value Range Status   MRSA by PCR NEGATIVE  NEGATIVE Final   Comment:            The GeneXpert MRSA Assay (FDA     approved for NASAL specimens     only), is one component of a     comprehensive MRSA colonization     surveillance program. It is not     intended to diagnose MRSA     infection nor to guide or     monitor treatment for     MRSA infections.     Studies:  Recent x-ray studies have been reviewed in detail by the Attending Physician  Scheduled Meds:  Scheduled Meds: . aspirin  300 mg Rectal Daily  . darbepoetin (ARANESP) injection - DIALYSIS  100 mcg Intravenous Q Sat-HD  . hydrALAZINE  5 mg Intravenous TID  . LORazepam  [name] mg Intravenous Once  . metoprolol  5 mg Intravenous Q4H  . pantoprazole (PROTONIX) IV  40 mg Intravenous Q24H  . piperacillin-tazobactam (ZOSYN)  IV  2.25 g Intravenous Q8H  . sodium chloride  3 mL Intravenous Q12H   Continuous Infusions: . heparin [name],150 Units/hr (04/20/13 0944)  . nitroGLYCERIN 30 mcg/min (04/20/13 1300)    Time spent on care of this patient:   [facility] T  [facility] Office  [phone] Pager - Text Page per Loretha Stapler as per below:  On-Call/Text Page:      [email or web address]      password [email or web address]  If 7PM-7AM, please contact night-coverage [email or web address] Password [email or web address] 04/20/2013, [name]:47 PM   LOS: [name] day
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
## 2013-04-21 LAB — LEGIONELLA ANTIGEN, URINE: Legionella Antigen, Urine: NEGATIVE

## 2013-04-21 LAB — BASIC METABOLIC PANEL
BUN: 33 mg/dL — ABNORMAL HIGH (ref 6–23)
Chloride: 90 mEq/L — ABNORMAL LOW (ref 96–112)
Creatinine, Ser: 3.18 mg/dL — ABNORMAL HIGH (ref 0.50–1.35)
GFR calc Af Amer: 27 mL/min — ABNORMAL LOW (ref >90)
GFR calc non Af Amer: 23 mL/min — ABNORMAL LOW (ref >90)
Glucose, Bld: 104 mg/dL — ABNORMAL HIGH (ref 70–99)

## 2013-04-21 LAB — URINE CULTURE
Colony Count: NO GROWTH
Culture: NO GROWTH

## 2013-04-21 LAB — CBC
Hemoglobin: 8.1 g/dL — ABNORMAL LOW (ref 13.0–17.0)
MCH: 30.8 pg (ref 26.0–34.0)
Platelets: 185 10*3/uL (ref 150–400)
RBC: 2.63 MIL/uL — ABNORMAL LOW (ref 4.22–5.81)
WBC: 8.8 10*3/uL (ref 4.0–10.5)

## 2013-04-21 MED ORDER — HEPARIN BOLUS VIA INFUSION
2000.0000 [IU] | Freq: Once | INTRAVENOUS | Status: AC
  Administered 2013-04-21: 2000 [IU] via INTRAVENOUS
  Filled 2013-04-21: qty 2000

## 2013-04-21 MED ORDER — HEPARIN BOLUS VIA INFUSION
2500.0000 [IU] | Freq: Once | INTRAVENOUS | Status: AC
  Administered 2013-04-21: 2500 [IU] via INTRAVENOUS
  Filled 2013-04-21: qty 2500

## 2013-04-21 MED ORDER — LORAZEPAM 2 MG/ML IJ SOLN: 1.0000 mg | INTRAMUSCULAR | Status: DC

## 2013-04-21 MED ORDER — CLONIDINE HCL 0.2 MG PO TABS
0.2000 mg | ORAL_TABLET | Freq: Three times a day (TID) | ORAL | Status: DC
  Administered 2013-04-21 (×2): 0.2 mg via ORAL
  Filled 2013-04-21 (×5): qty 1

## 2013-04-21 MED ORDER — LORAZEPAM 2 MG/ML IJ SOLN
2.0000 mg | INTRAMUSCULAR | Status: DC
  Administered 2013-04-21 – 2013-04-24 (×16): 2 mg via INTRAVENOUS
  Filled 2013-04-21 (×13): qty 1

## 2013-04-21 MED ORDER — CARVEDILOL 25 MG PO TABS
25.0000 mg | ORAL_TABLET | Freq: Two times a day (BID) | ORAL | Status: DC
  Administered 2013-04-21 – 2013-04-22 (×3): 25 mg via ORAL
  Filled 2013-04-21 (×6): qty 1

## 2013-04-21 MED ORDER — SEVELAMER CARBONATE 800 MG PO TABS
1600.0000 mg | ORAL_TABLET | Freq: Three times a day (TID) | ORAL | Status: DC
  Administered 2013-04-21 – 2013-04-24 (×5): 1600 mg via ORAL
  Filled 2013-04-21 (×12): qty 2

## 2013-04-21 NOTE — Progress Notes (Signed)
[facility] Progress Note [place] TEAM 1 - Stepdown/ICU TEAM   MIDAS DAUGHETY ZOX:[id number] DOB: [birth year] DOA: 04/19/2013 PCP: Isabella Stalling, MD  Brief narrative: 37 year old man returned to the emergency department with lethargy and shortness of breath. Initial evaluation was notable for marked hypertension, hypoxia and obvious volume overload confirmed with chest x-ray. BiPAP was initiated and dialysis was planned while blood work and further testing was in process. Hospitalists were consulted at that time for further recommendations.  History obtained from chart and EDP. Has complex medical history including end-stage renal disease, ischemic cardiomyopathy, COPD and noncompliance. Patient unable to provide any history. By report he had hemodialysis 5/1. He was brought to the emergency department for increasing shortness of breath, facial and leg swelling. Family reported patient was lethargic and simply sat in a chair most of the day.  Chart review is notable for 3 admissions in the last 6 months. Long history of noncompliance medically. Sometimes ends dialysis early and is noncompliant with fluid intake.  After Hospitalist's initial evaluation further information became available including troponin 1.61, lactic acid 4.9. Given his complex history and known heart disease Cardiology consultation is warranted and plans were made for transfer to [facility].   Assessment/Plan:  Acute hypoxic respiratory failure Secondary to pulmonary edema and massive volume overload - ongoing daily hemodialysis - now off BIPAP s/p massive fluid extraction via HD (10L thus far) - resp status much improved   Acute systolic just heart failure with massive volume overload/anasarca Ongoing daily hemodialysis - manage blood pressure, which is improving with HD - nitroglycerin infusion as per Cardiology   Hypertensive emergency Resuming home medical regimen - suspect this regimen can be simplified as further  volume removed via dialysis - nitroglycerin drip not indicated for blood pressure alone presently but cardiology is managing for NSTEMI  Elevated troponin without EKG changes - NSTEMI Suspect demand ischemia secondary to pulmonary edema but cannot exclude ACS - serial cardiac enzymes climbing - Cardiology following - Aspirin, beta blocker, heparin drip, nitroglycerin drip  Lactic acidosis Suspected to be reflective of hypoperfusion secondary to volume overload rather than sepsis - improving with treatment of same  End-stage renal disease on hemodialysis Tuesday Thursday Saturday Nephrology following - long-term compliance proving to be an issue (see nephrology note)  Ischemic cardiomyopathy plan as above - stent to circumflex and RCA 12/2008 - Left ventricular ejection fraction 30-35% 03/2013  ?echodensity in chordal region of posterior mitral leaflet + possible LA mass Will need further evaluation once more stable - see Cards notes - repeat echo to be ordered when volume status more stable  History of COPD No evidence of acute exacerbation at this time  History of noncompliance As documented in history of present illness  Schizophrenia +/- bipolar disorder Likely contributing to compliance issues - have resumed home treatment regimen - consider psychiatry consultation during this hospital stay  Code Status: FULL Family Communication: no family present at time of exam Disposition Plan: SDU  Consultants: Nephrology Cardiology  Procedures: none  Antibiotics: Zosyn 5/2 >>5/4 Vanc 5/2>>5/4  DVT prophylaxis: heparin  HPI/Subjective: Patient is much more alert.  He is somewhat agitated and is not cooperating with the nursing staff.  He is refusing interventions frequently.  He denies any complaints and simply states "I want to go home."  Objective: Blood pressure 168/113, pulse 86, temperature 97.9 F (36.6 C), temperature source Oral, resp. rate 19, height 5\' 8"  (1.727 m),  weight 90.3 kg (199 lb 1.2 oz), SpO2 92.00%.  Intake/Output Summary (Last 24 hours) at 04/21/13 1050 Last data filed at 04/21/13 0900  Gross per 24 hour  Intake 1398.9 ml  Output   4410 ml  Net -3011.1 ml   Exam: General: No acute respiratory distress at this time Lungs: diffuse crackles persist- no wheeze  Cardiovascular: distant hs - regular rate and rythmn Abdomen: Nontender, nondistended, soft, bowel sounds positive, no rebound, no ascites, no appreciable mass Extremities: 3++ bilateral lower extremity edema with changes of venous stasis dermatitis  Data Reviewed: Basic Metabolic Panel:  Recent Labs Lab 04/19/13 1720 04/20/13 0240 04/20/13 1330 04/21/13 0243  NA 132*  --  136 138  K 5.3*  --  4.7 3.9  CL 88*  --  92* 90*  CO2 20  --  24 26  GLUCOSE 144*  --  118* 104*  BUN 49*  --  42* 33*  CREATININE 5.82* 3.80* 4.56* 3.18*  CALCIUM 10.2  --  10.1 10.0  PHOS  --   --  6.5*  --    CBC:  Recent Labs Lab 04/19/13 1720 04/20/13 0240 04/21/13 0243  WBC 6.5 10.0 8.8  NEUTROABS 5.4  --   --   HGB 9.4* 9.2* 8.1*  HCT 28.4* 27.5* 24.1*  MCV 95.0 90.2 91.6  PLT 187 175 185   Cardiac Enzymes:  Recent Labs Lab 04/19/13 1720 04/20/13 0137 04/20/13 0645  TROPONINI 1.61* 4.21* 6.03*   BNP (last 3 results)  Recent Labs  02/18/13 1402 03/21/13 1843  PROBNP >70000.0* >70000.0*    Recent Results (from the past 240 hour(s))  MRSA PCR SCREENING     Status: None   Collection Time    04/19/13  8:31 PM      Result Value Range Status   MRSA by PCR NEGATIVE  NEGATIVE Final   Comment:            The GeneXpert MRSA Assay (FDA     approved for NASAL specimens     only), is one component of a     comprehensive MRSA colonization     surveillance program. It is not     intended to diagnose MRSA     infection nor to guide or     monitor treatment for     MRSA infections.     Studies:  Recent x-ray studies have been reviewed in detail by the Attending  Physician  Scheduled Meds:  Scheduled Meds: . aspirin  325 mg Oral Daily  . carvedilol  25 mg Oral BID WC  . cloNIDine  0.2 mg Oral BID  . darbepoetin (ARANESP) injection - DIALYSIS  100 mcg Intravenous Q Sat-HD  . DULoxetine  60 mg Oral Daily  . hydrALAZINE  50 mg Oral TID  . lisinopril  20 mg Oral BID  . LORazepam  1 mg Intravenous Q4H  . multivitamin  1 tablet Oral QHS  . OLANZapine  10 mg Oral BID  . pantoprazole  40 mg Oral Q1200  . piperacillin-tazobactam (ZOSYN)  IV  2.25 g Intravenous Q8H  . sevelamer carbonate  1,600 mg Oral TID WC   Continuous Infusions: . heparin 1,700 Units/hr (04/21/13 0357)  . nitroGLYCERIN 30 mcg/min (04/20/13 1900)    Time spent on care of this patient:   [facility] T  [facility] Office  [phone] Pager - Text Page per Loretha Stapler as per below:  On-Call/Text Page:      [email or web address]      password [email or web address]  If 7PM-7AM, please contact  night-coverage [email or web address] Password [email or web address] 04/21/2013, 10:50 AM   LOS: 2 days

## 2013-04-21 NOTE — Progress Notes (Signed)
ANTICOAGULATION CONSULT NOTE - Follow Up Consult  Pharmacy Consult for Heparin Indication: NSTEMI  Allergies  Allergen Reactions  . Methadone Anaphylaxis  . Simvastatin Hives and Swelling  . Fentanyl Rash  . Ibuprofen Swelling and Rash  . Ketorolac Tromethamine Other (See Comments)    unknown  . Naproxen Rash  . Tramadol Hcl Rash    Patient Measurements: Height: 5\' 8"  (172.7 cm) Weight: 199 lb 1.2 oz (90.3 kg) IBW/kg (Calculated) : 68.4 Heparin Dosing Weight: 87.6kg  Vital Signs: Temp: 97.4 F (36.3 C) (05/04 1932) Temp src: Axillary (05/04 1932) BP: 172/121 mmHg (05/04 1932) Pulse Rate: 89 (05/04 1932)  Labs:  Recent Labs  04/19/13 1720 04/20/13 0137 04/20/13 0240 04/20/13 0645 04/20/13 1330  04/21/13 0243 04/21/13 1245 04/21/13 2158  HGB 9.4*  --  9.2*  --   --   --  8.1*  --   --   HCT 28.4*  --  27.5*  --   --   --  24.1*  --   --   PLT 187  --  175  --   --   --  185  --   --   APTT 43*  --   --   --   --   --   --   --   --   LABPROT 16.6*  --   --   --   --   --   --   --   --   INR 1.38  --   --   --   --   --   --   --   --   HEPARINUNFRC  --   --   --   --   --   < > 0.11* 0.17* 0.15*  CREATININE 5.82*  --  3.80*  --  4.56*  --  3.18*  --   --   TROPONINI 1.61* 4.21*  --  6.03*  --   --   --   --   --   < > = values in this interval not displayed.  Estimated Creatinine Clearance: 34.4 ml/min (by C-G formula based on Cr of 3.18).   Medications:  Heparin 2000 units/hr  Assessment: 36yom on heparin for NSTEMI. Heparin level (0.15) is below-goal on 2000 units/hr. No bleeding per RN.   Goal of Therapy:  Heparin level 0.3-0.7 units/ml Monitor platelets by anticoagulation protocol: Yes   Plan:  1. Heparin IV bolus 2500 units x 1, then increase IV infusion to 2300 units/hr.  2. Heparin level in 6 hours.   Emeline Gins 04/21/2013,10:56 PM

## 2013-04-21 NOTE — Progress Notes (Signed)
RN placed bipap on patient due to patient's work of breathing. RT will continue to monitor.

## 2013-04-21 NOTE — Progress Notes (Signed)
Subjective:  Finally after 10 plus liters removed with HD Fri and Sat he is able to come off of bipap.  Is being difficult with nursing staff, refusing treatments   Objective Vital signs in last 24 hours: Filed Vitals:   04/21/13 0745 04/21/13 0810 04/21/13 0835 04/21/13 0845  BP: 194/114 189/108 184/111 189/109  Pulse: 104 85 91 89  Temp:      TempSrc:      Resp: 13  21 30   Height:      Weight:      SpO2: 86%  91% 91%   Weight change: 8.9 kg (19 lb 9.9 oz)  Intake/Output Summary (Last 24 hours) at 04/21/13 0858 Last data filed at 04/21/13 0600  Gross per 24 hour  Intake 926.97 ml  Output   4410 ml  Net -3483.03 ml   Labs: Basic Metabolic Panel:  Recent Labs Lab 04/19/13 1720 04/20/13 0240 04/20/13 1330 04/21/13 0243  NA 132*  --  136 138  K 5.3*  --  4.7 3.9  CL 88*  --  92* 90*  CO2 20  --  24 26  GLUCOSE 144*  --  118* 104*  BUN 49*  --  42* 33*  CREATININE 5.82* 3.80* 4.56* 3.18*  CALCIUM 10.2  --  10.1 10.0  PHOS  --   --  6.5*  --    Liver Function Tests:  Recent Labs Lab 04/20/13 1330  ALBUMIN 3.1*   No results found for this basename: LIPASE, AMYLASE,  in the last 168 hours No results found for this basename: AMMONIA,  in the last 168 hours CBC:  Recent Labs Lab 04/19/13 1720 04/20/13 0240 04/21/13 0243  WBC 6.5 10.0 8.8  NEUTROABS 5.4  --   --   HGB 9.4* 9.2* 8.1*  HCT 28.4* 27.5* 24.1*  MCV 95.0 90.2 91.6  PLT 187 175 185   Cardiac Enzymes:  Recent Labs Lab 04/19/13 1720 04/20/13 0137 04/20/13 0645  TROPONINI 1.61* 4.21* 6.03*   CBG: No results found for this basename: GLUCAP,  in the last 168 hours  Iron Studies: No results found for this basename: IRON, TIBC, TRANSFERRIN, FERRITIN,  in the last 72 hours Studies/Results: Dg Chest Port 1 View  04/20/2013  *RADIOLOGY REPORT*  Clinical Data:  Respiratory failure  PORTABLE CHEST - 1 VIEW  Comparison: 04/19/2013  Findings: Marked cardiomegaly/cardiac silhouette enlargement. Aortic  atherosclerosis. Hilar prominence may reflect central vascular congestion.  Bilateral airspace opacities shows some interval improvement with right perihilar fullness persisting. Small effusions not excluded.  No pneumothorax.  Periphery of the no acute osseous finding.  IMPRESSION: Some interval improved aeration since the prior, with persistent multifocal airspace opacity; asymmetric edema versus multifocal pneumonia.  Persistent cardiomegaly and hilar prominence.   Original Report Authenticated By: Jearld Lesch, M.D.    Dg Chest Portable 1 View  04/19/2013  *RADIOLOGY REPORT*  Clinical Data: Altered mental status  PORTABLE CHEST - 1 VIEW  Comparison: 03/21/2013  Findings: Cardiomegaly again noted.  There is central vascular congestion and bilateral airspace disease highly suspicious for bilateral pulmonary edema.  Superimposed infiltrate cannot be excluded.  Clinical correlation is necessary.  IMPRESSION:  There is central vascular congestion and bilateral airspace disease highly suspicious for bilateral pulmonary edema. Superimposed infiltrate cannot be excluded.  Clinical correlation is necessary.   Original Report Authenticated By: Natasha Mead, M.D.    Medications: Infusions: . heparin 1,700 Units/hr (04/21/13 0357)  . nitroGLYCERIN 30 mcg/min (04/20/13 1900)  Scheduled Medications: . aspirin  325 mg Oral Daily  . carvedilol  12.5 mg Oral BID WC  . cloNIDine  0.2 mg Oral BID  . darbepoetin (ARANESP) injection - DIALYSIS  100 mcg Intravenous Q Sat-HD  . DULoxetine  60 mg Oral Daily  . hydrALAZINE  50 mg Oral TID  . lisinopril  20 mg Oral BID  . LORazepam  1 mg Intravenous Q4H  . multivitamin  1 tablet Oral QHS  . OLANZapine  10 mg Oral BID  . pantoprazole  40 mg Oral Q1200  . piperacillin-tazobactam (ZOSYN)  IV  2.25 g Intravenous Q8H    have reviewed scheduled and prn medications.  Physical Exam: General: fetal position in the bed, will not look at me-  unpredictable with his  movements Heart: RRR with occas tachy episodes Lungs: CBS bilat Abdomen: soft, non tender Extremities: pitting edema Dialysis Access: left upper arm AVF- good thrill and bruit- no infiltration !  Assessment/Plan: 37 year old WM with ESRD and cardiomyopathy with medical noncompliance-  has massive anasarca and is requiring bipap and has positive troponins  1 Very impressive volume overload-  6 liters Fri with 4 plus off Sat with still very far to go. He needs aggressive daily dialysis with aim of aggressive volume removal. Positive troponins per cards - just stress of too much volume or actual ischemia?  2 ESRD: noncompliance to the extreme- aggressive daily HD and once out of woods need to really try to get to the bottom of why he is so noncompliant. He likely is not a candidate for aggressive care in the future if he is going to continue to behave like this. Is this his psych diagnosis that is driving this behavior ? Consider palliative care consult once more stable. Next HD tomorrow 3 Hypertension: malignant due to volume- has many meds for BP but suspect that he would need much less if he had volume control  4. Anemia of ESRD: hgb now in the 8's - have added  aranesp and will  check iron stores  5. Metabolic Bone Disease: renvela once eating- check PTH and phos 6. Possible sepsis/PNA- cultures drawn and vanc and zosyn added- temp has been up and down- CCM on board.      [name] A   04/21/2013,8:58 AM  LOS: 2 days

## 2013-04-21 NOTE — Progress Notes (Signed)
ANTICOAGULATION CONSULT NOTE - Follow Up Consult  Pharmacy Consult for Heparin Indication: NSTEMI  Allergies  Allergen Reactions  . Methadone Anaphylaxis  . Simvastatin Hives and Swelling  . Fentanyl Rash  . Ibuprofen Swelling and Rash  . Ketorolac Tromethamine Other (See Comments)    unknown  . Naproxen Rash  . Tramadol Hcl Rash    Patient Measurements: Height: 5\' 8"  (172.7 cm) Weight: 199 lb 1.2 oz (90.3 kg) IBW/kg (Calculated) : 68.4 Heparin Dosing Weight: 87.6kg  Vital Signs: Temp: 97.9 F (36.6 C) (05/04 0810) Temp src: Oral (05/04 0810) BP: 189/117 mmHg (05/04 1300) Pulse Rate: 93 (05/04 1300)  Labs:  Recent Labs  04/19/13 1720 04/20/13 0137 04/20/13 0240 04/20/13 0645 04/20/13 1330 04/20/13 1803 04/21/13 0243 04/21/13 1245  HGB 9.4*  --  9.2*  --   --   --  8.1*  --   HCT 28.4*  --  27.5*  --   --   --  24.1*  --   PLT 187  --  175  --   --   --  185  --   APTT 43*  --   --   --   --   --   --   --   LABPROT 16.6*  --   --   --   --   --   --   --   INR 1.38  --   --   --   --   --   --   --   HEPARINUNFRC  --   --   --   --   --  0.16* 0.11* 0.17*  CREATININE 5.82*  --  3.80*  --  4.56*  --  3.18*  --   TROPONINI 1.61* 4.21*  --  6.03*  --   --   --   --     Estimated Creatinine Clearance: 34.4 ml/min (by C-G formula based on Cr of 3.18).   Medications:  Heparin 1700 units/hr  Assessment: 36yom on heparin for NSTEMI. Heparin level (0.17) remains subtherapeutic despite rate increases - will rebolus and increase rate by ~3.5 units/kg/hr. - H/H trending down, Plts wnl - No significant bleeding reported - No problems with line/infusion per RN  Goal of Therapy:  Heparin level 0.3-0.7 units/ml Monitor platelets by anticoagulation protocol: Yes   Plan:  1. Heparin IV bolus 2000 units x 1  2. Increase heparin drip to 2000 units/hr (20 ml/hr) 3. Check heparin level 8 hours after rate increase  Cleon Dew [phone] 04/21/2013,1:56  PM

## 2013-04-21 NOTE — Progress Notes (Signed)
ANTICOAGULATION CONSULT NOTE  Pharmacy Consult for Heparin Indication: chest pain/ACS  Allergies  Allergen Reactions  . Methadone Anaphylaxis  . Simvastatin Hives and Swelling  . Fentanyl Rash  . Ibuprofen Swelling and Rash  . Ketorolac Tromethamine Other (See Comments)    unknown  . Naproxen Rash  . Tramadol Hcl Rash    Patient Measurements: Height: 5\' 8"  (172.7 cm) Weight: 199 lb 1.2 oz (90.3 kg) IBW/kg (Calculated) : 68.4 Heparin Dosing Weight: 87.6kg  Vital Signs: Temp: 98.5 F (36.9 C) (05/04 0345) Temp src: Oral (05/04 0345) BP: 160/96 mmHg (05/04 0345) Pulse Rate: 96 (05/04 0345)  Labs:  Recent Labs  04/19/13 1720 04/20/13 0137 04/20/13 0240 04/20/13 0645 04/20/13 1330 04/20/13 1803 04/21/13 0243  HGB 9.4*  --  9.2*  --   --   --  8.1*  HCT 28.4*  --  27.5*  --   --   --  24.1*  PLT 187  --  175  --   --   --  185  APTT 43*  --   --   --   --   --   --   LABPROT 16.6*  --   --   --   --   --   --   INR 1.38  --   --   --   --   --   --   HEPARINUNFRC  --   --   --   --   --  0.16* 0.11*  CREATININE 5.82*  --  3.80*  --  4.56*  --   --   TROPONINI 1.61* 4.21*  --  6.03*  --   --   --     Estimated Creatinine Clearance: 24 ml/min (by C-G formula based on Cr of 4.56).  Assessment:  Goal of Therapy:  Heparin level 0.3-0.7 units/ml Monitor platelets by anticoagulation protocol: Yes   Plan:  Heparin 2000 units IV bolus, then increase heparin 1700 units/hr Check heparin level in 8 hours.  Geannie Risen, PharmD, BCPS  04/21/2013,3:49 AM

## 2013-04-21 NOTE — Progress Notes (Addendum)
Subjective: Patient denies CP  Breathing is fair.  Slumped sitting in bed. Objective: Filed Vitals:   04/20/13 1950 04/20/13 2348 04/21/13 0145 04/21/13 0345  BP: 171/101 161/94  160/96  Pulse: 107 102 105 96  Temp: 97.4 F (36.3 C) 98.5 F (36.9 C)  98.5 F (36.9 C)  TempSrc: Oral Oral  Oral  Resp: 21 23 23 21   Height:      Weight:    199 lb 1.2 oz (90.3 kg)  SpO2: 95% 95% 93% 97%   Weight change: 19 lb 9.9 oz (8.9 kg)  Intake/Output Summary (Last 24 hours) at 04/21/13 0732 Last data filed at 04/21/13 0600  Gross per 24 hour  Intake 926.97 ml  Output   4410 ml  Net -3483.03 ml    General: Alert, awake, oriented x3, in no acute distress Neck:  JVP is difficlut to assess  Heart: Regular rate and rhythm, without murmurs, rubs, gallops.  Lungs: Decreased BS at bases.   Exemities:  2 to 3+  edema.   Neuro: Grossly intact, nonfocal.  TEle:  SR   Lab Results: Results for orders placed during the hospital encounter of 04/19/13 (from the past 24 hour(s))  STREP PNEUMONIAE URINARY ANTIGEN     Status: None   Collection Time    04/20/13 11:00 AM      Result Value Range   Strep Pneumo Urinary Antigen NEGATIVE  NEGATIVE  RENAL FUNCTION PANEL     Status: Abnormal   Collection Time    04/20/13  1:30 PM      Result Value Range   Sodium 136  135 - 145 mEq/L   Potassium 4.7  3.5 - 5.1 mEq/L   Chloride 92 (*) 96 - 112 mEq/L   CO2 24  19 - 32 mEq/L   Glucose, Bld 118 (*) 70 - 99 mg/dL   BUN 42 (*) 6 - 23 mg/dL   Creatinine, Ser 1.61 (*) 0.50 - 1.35 mg/dL   Calcium 09.6  8.4 - 04.5 mg/dL   Phosphorus 6.5 (*) 2.3 - 4.6 mg/dL   Albumin 3.1 (*) 3.5 - 5.2 g/dL   GFR calc non Af Amer 15 (*) >90 mL/min   GFR calc Af Amer 17 (*) >90 mL/min  HEPARIN LEVEL (UNFRACTIONATED)     Status: Abnormal   Collection Time    04/20/13  6:03 PM      Result Value Range   Heparin Unfractionated 0.16 (*) 0.30 - 0.70 IU/mL  HEPARIN LEVEL (UNFRACTIONATED)     Status: Abnormal   Collection Time   04/21/13  2:43 AM      Result Value Range   Heparin Unfractionated 0.11 (*) 0.30 - 0.70 IU/mL  CBC     Status: Abnormal   Collection Time    04/21/13  2:43 AM      Result Value Range   WBC 8.8  4.0 - 10.5 K/uL   RBC 2.63 (*) 4.22 - 5.81 MIL/uL   Hemoglobin 8.1 (*) 13.0 - 17.0 g/dL   HCT 40.9 (*) 81.1 - 91.4 %   MCV 91.6  78.0 - 100.0 fL   MCH 30.8  26.0 - 34.0 pg   MCHC 33.6  30.0 - 36.0 g/dL   RDW 78.2 (*) 95.6 - 21.3 %   Platelets 185  150 - 400 K/uL  BASIC METABOLIC PANEL     Status: Abnormal   Collection Time    04/21/13  2:43 AM      Result Value Range  Sodium 138  135 - 145 mEq/L   Potassium 3.9  3.5 - 5.1 mEq/L   Chloride 90 (*) 96 - 112 mEq/L   CO2 26  19 - 32 mEq/L   Glucose, Bld 104 (*) 70 - 99 mg/dL   BUN 33 (*) 6 - 23 mg/dL   Creatinine, Ser 1.61 (*) 0.50 - 1.35 mg/dL   Calcium 09.6  8.4 - 04.5 mg/dL   GFR calc non Af Amer 23 (*) >90 mL/min   GFR calc Af Amer 27 (*) >90 mL/min    Studies/Results: @RISRSLT24 @  Medications: Reviewed   @PROBHOSP @  1.  NSTEMI   Currently pain free.  Etiology unclear. ? Primary ischemia vis ischemia in setting of marked volume increase.EKG with some ST depression.  Would continue medical Rx  Repeat enzymes.  I Continue NTG and heparin for now.  2.  HTN  WIll advance meds  Agree he needs more volume removed.  3.  ESRD  Followed by renal service  4.  HL  On review had hives with simvistatin  Will need to review before beginning Rx.  5.  Abnormal echo.  Echo at [facility] about 1 month ago with abnormality in LA  I would wair on reordering echo to reeval until more fluid removed and can lay flat. Patient is very difficult to treat  Long history of noncompliance  Continues to refuse rx in hospital.  Very difficult situation.   LOS: 2 days   Dietrich Pates 04/21/2013, 7:32 AM

## 2013-04-21 NOTE — Progress Notes (Signed)
Blood pressure remains to be high. MD's are aware of this and have addressed it with new orders. Patient continues to be restless and gets up and down frequently. So far the patient has been compliant with his fluid intake.

## 2013-04-22 ENCOUNTER — Encounter (HOSPITAL_COMMUNITY): Payer: Self-pay | Admitting: Nephrology

## 2013-04-22 DIAGNOSIS — R079 Chest pain, unspecified: Secondary | ICD-10-CM

## 2013-04-22 DIAGNOSIS — F0630 Mood disorder due to known physiological condition, unspecified: Secondary | ICD-10-CM

## 2013-04-22 DIAGNOSIS — D649 Anemia, unspecified: Secondary | ICD-10-CM

## 2013-04-22 LAB — IRON AND TIBC
Iron: 151 ug/dL — ABNORMAL HIGH (ref 42–135)
TIBC: 200 ug/dL — ABNORMAL LOW (ref 215–435)

## 2013-04-22 LAB — CBC
HCT: 24.8 % — ABNORMAL LOW (ref 39.0–52.0)
HCT: 24.7 % — ABNORMAL LOW (ref 39.0–52.0)
Hemoglobin: 8.4 g/dL — ABNORMAL LOW (ref 13.0–17.0)
Hemoglobin: 8.5 g/dL — ABNORMAL LOW (ref 13.0–17.0)
MCV: 90.5 fL (ref 78.0–100.0)
RDW: 17.7 % — ABNORMAL HIGH (ref 11.5–15.5)
RDW: 18.2 % — ABNORMAL HIGH (ref 11.5–15.5)
WBC: 12.3 10*3/uL — ABNORMAL HIGH (ref 4.0–10.5)
WBC: 11.5 10*3/uL — ABNORMAL HIGH (ref 4.0–10.5)

## 2013-04-22 LAB — RENAL FUNCTION PANEL
Albumin: 3.1 g/dL — ABNORMAL LOW (ref 3.5–5.2)
BUN: 70 mg/dL — ABNORMAL HIGH (ref 6–23)
Chloride: 92 mEq/L — ABNORMAL LOW (ref 96–112)
GFR calc Af Amer: 15 mL/min — ABNORMAL LOW (ref >90)
GFR calc non Af Amer: 13 mL/min — ABNORMAL LOW (ref >90)
Potassium: 5.1 mEq/L (ref 3.5–5.1)
Sodium: 133 mEq/L — ABNORMAL LOW (ref 135–145)

## 2013-04-22 MED ORDER — HEPARIN SODIUM (PORCINE) 1000 UNIT/ML DIALYSIS: 1000.0000 [IU] | INTRAMUSCULAR | Status: DC | PRN

## 2013-04-22 MED ORDER — PENTAFLUOROPROP-TETRAFLUOROETH EX AERO: 1.0000 "application " | INHALATION_SPRAY | CUTANEOUS | Status: DC | PRN

## 2013-04-22 MED ORDER — SODIUM CHLORIDE 0.9 % IV SOLN: 100.0000 mL | INTRAVENOUS | Status: DC | PRN

## 2013-04-22 MED ORDER — HEPARIN BOLUS VIA INFUSION
2500.0000 [IU] | Freq: Once | INTRAVENOUS | Status: AC
  Administered 2013-04-22: 2500 [IU] via INTRAVENOUS
  Filled 2013-04-22: qty 2500

## 2013-04-22 MED ORDER — NITROGLYCERIN 2 % TD OINT
1.0000 [in_us] | TOPICAL_OINTMENT | TRANSDERMAL | Status: DC
  Administered 2013-04-22 – 2013-04-23 (×4): 1 [in_us] via TOPICAL
  Filled 2013-04-22: qty 30

## 2013-04-22 MED ORDER — HEPARIN SODIUM (PORCINE) 5000 UNIT/ML IJ SOLN
5000.0000 [IU] | Freq: Three times a day (TID) | INTRAMUSCULAR | Status: DC
  Administered 2013-04-22 – 2013-04-24 (×4): 5000 [IU] via SUBCUTANEOUS
  Filled 2013-04-22 (×9): qty 1

## 2013-04-22 MED ORDER — LORAZEPAM 2 MG/ML IJ SOLN
INTRAMUSCULAR | Status: AC
  Filled 2013-04-22: qty 1

## 2013-04-22 MED ORDER — HEPARIN SODIUM (PORCINE) 1000 UNIT/ML DIALYSIS: 3000.0000 [IU] | Freq: Once | INTRAMUSCULAR | Status: DC

## 2013-04-22 MED ORDER — HYDRALAZINE HCL 50 MG PO TABS
100.0000 mg | ORAL_TABLET | Freq: Three times a day (TID) | ORAL | Status: DC
  Administered 2013-04-22: 100 mg via ORAL
  Filled 2013-04-22 (×7): qty 2

## 2013-04-22 MED ORDER — LIDOCAINE HCL (PF) 1 % IJ SOLN: 5.0000 mL | INTRAMUSCULAR | Status: DC | PRN

## 2013-04-22 MED ORDER — ALTEPLASE 2 MG IJ SOLR
2.0000 mg | Freq: Once | INTRAMUSCULAR | Status: DC | PRN
  Filled 2013-04-22: qty 2

## 2013-04-22 MED ORDER — LIDOCAINE-PRILOCAINE 2.5-2.5 % EX CREA
1.0000 "application " | TOPICAL_CREAM | CUTANEOUS | Status: DC | PRN
  Filled 2013-04-22: qty 5

## 2013-04-22 MED ORDER — CLONIDINE HCL 0.3 MG PO TABS
0.3000 mg | ORAL_TABLET | Freq: Three times a day (TID) | ORAL | Status: DC
  Administered 2013-04-22: 0.3 mg via ORAL
  Filled 2013-04-22 (×7): qty 1

## 2013-04-22 MED ORDER — SODIUM CHLORIDE 0.9 % IJ SOLN
INTRAMUSCULAR | Status: AC
  Administered 2013-04-22: 10 mL
  Filled 2013-04-22: qty 10

## 2013-04-22 MED ORDER — NEPRO/CARBSTEADY PO LIQD
237.0000 mL | ORAL | Status: DC | PRN
  Filled 2013-04-22: qty 237

## 2013-04-22 NOTE — Progress Notes (Signed)
ANTICOAGULATION CONSULT NOTE - Follow Up Consult  Pharmacy Consult for Heparin Indication: NSTEMI  Allergies  Allergen Reactions  . Methadone Anaphylaxis  . Simvastatin Hives and Swelling  . Fentanyl Rash  . Ibuprofen Swelling and Rash  . Ketorolac Tromethamine Other (See Comments)    unknown  . Naproxen Rash  . Tramadol Hcl Rash    Patient Measurements: Height: 5\' 8"  (172.7 cm) Weight: 198 lb 13.7 oz (90.2 kg) IBW/kg (Calculated) : 68.4 Heparin Dosing Weight: 87.6kg  Vital Signs: Temp: 96.9 F (36.1 C) (05/05 0328) Temp src: Axillary (05/05 0328) BP: 193/129 mmHg (05/05 0515) Pulse Rate: 111 (05/05 0515)  Labs:  Recent Labs  04/19/13 1720 04/20/13 0137 04/20/13 0240 04/20/13 0645 04/20/13 1330  04/21/13 0243 04/21/13 1245 04/21/13 2158 04/22/13 0440  HGB 9.4*  --  9.2*  --   --   --  8.1*  --   --  8.4*  HCT 28.4*  --  27.5*  --   --   --  24.1*  --   --  24.8*  PLT 187  --  175  --   --   --  185  --   --  231  APTT 43*  --   --   --   --   --   --   --   --   --   LABPROT 16.6*  --   --   --   --   --   --   --   --   --   INR 1.38  --   --   --   --   --   --   --   --   --   HEPARINUNFRC  --   --   --   --   --   < > 0.11* 0.17* 0.15* 0.18*  CREATININE 5.82*  --  3.80*  --  4.56*  --  3.18*  --   --   --   TROPONINI 1.61* 4.21*  --  6.03*  --   --   --   --   --   --   < > = values in this interval not displayed.  Estimated Creatinine Clearance: 34.3 ml/min (by C-G formula based on Cr of 3.18).   Medications:  Heparin 2300 units/hr  Assessment: 36 yom on heparin for NSTEMI. Heparin level (0.18) is below-goal on 2300 units/hr. No bleeding per RN.   Goal of Therapy:  Heparin level 0.3-0.7 units/ml Monitor platelets by anticoagulation protocol: Yes   Plan:  1. Heparin IV bolus 2500 units x 1, then increase IV infusion to 2650 units/hr.  2. Heparin level in 8 hours.   Emeline Gins 04/22/2013,5:36 AM

## 2013-04-22 NOTE — Progress Notes (Signed)
Subjective:  Still quite dyspneic, off bipap now, restless  Objective Vital signs in last 24 hours: Filed Vitals:   04/22/13 0430 04/22/13 0515 04/22/13 0530 04/22/13 0800  BP:  193/129 174/117 169/103  Pulse: 92 111 96 101  Temp:    98.6 F (37 C)  TempSrc:    Oral  Resp: 30 24 19 21   Height:      Weight:      SpO2: 93% 93% 96% 94%   Weight change: -2.6 kg (-5 lb 11.7 oz)  Intake/Output Summary (Last 24 hours) at 04/22/13 0934 Last data filed at 04/22/13 0700  Gross per 24 hour  Intake 968.16 ml  Output      0 ml  Net 968.16 ml   Labs: Basic Metabolic Panel:  Recent Labs Lab 04/19/13 1720 04/20/13 0240 04/20/13 1330 04/21/13 0243  NA 132*  --  136 138  K 5.3*  --  4.7 3.9  CL 88*  --  92* 90*  CO2 20  --  24 26  GLUCOSE 144*  --  118* 104*  BUN 49*  --  42* 33*  CREATININE 5.82* 3.80* 4.56* 3.18*  CALCIUM 10.2  --  10.1 10.0  PHOS  --   --  6.5*  --    Liver Function Tests:  Recent Labs Lab 04/20/13 1330  ALBUMIN 3.1*   No results found for this basename: LIPASE, AMYLASE,  in the last 168 hours No results found for this basename: AMMONIA,  in the last 168 hours CBC:  Recent Labs Lab 04/19/13 1720 04/20/13 0240 04/21/13 0243 04/22/13 0440  WBC 6.5 10.0 8.8 12.3*  NEUTROABS 5.4  --   --   --   HGB 9.4* 9.2* 8.1* 8.4*  HCT 28.4* 27.5* 24.1* 24.8*  MCV 95.0 90.2 91.6 90.5  PLT 187 175 185 231   Cardiac Enzymes:  Recent Labs Lab 04/19/13 1720 04/20/13 0137 04/20/13 0645  TROPONINI 1.61* 4.21* 6.03*   CBG: No results found for this basename: GLUCAP,  in the last 168 hours  Iron Studies: No results found for this basename: IRON, TIBC, TRANSFERRIN, FERRITIN,  in the last 72 hours Studies/Results: No results found. Medications: Infusions: . nitroGLYCERIN Stopped (04/22/13 0745)    Scheduled Medications: . aspirin  325 mg Oral Daily  . carvedilol  25 mg Oral BID WC  . cloNIDine  0.3 mg Oral Q8H  . darbepoetin (ARANESP) injection -  DIALYSIS  100 mcg Intravenous Q Sat-HD  . DULoxetine  60 mg Oral Daily  . heparin subcutaneous  5,000 Units Subcutaneous Q8H  . hydrALAZINE  100 mg Oral Q8H  . lisinopril  20 mg Oral BID  . LORazepam  2 mg Intravenous Q4H  . multivitamin  1 tablet Oral QHS  . nitroGLYCERIN  1 inch Topical Custom  . OLANZapine  10 mg Oral BID  . pantoprazole  40 mg Oral Q1200  . sevelamer carbonate  1,600 mg Oral TID WC    have reviewed scheduled and prn medications.  Physical Exam: General: fetal position in the bed, will not look at me-  unpredictable with his movements Heart: RRR with occas tachy episodes Lungs: CBS bilat Abdomen: soft, non tender Extremities: pitting edema Dialysis Access: left upper arm AVF- good thrill and bruit- no infiltration !  Outpatient HD: [facility] TTS 4.5hrs  82kg  2K/2.5Ca  Hep about 3000 units total  EPO 14,300  Hect 7ug Summary:  37 year old WM with ESRD and cardiomyopathy with medical  noncompliance, massive anasarca and positive troponins   Assessment/Plan: 1. Volume overload/pulmonary edema- still 8kg over usual dry weight, still dyspneic. Plan HD again today and tomorrow, will likely need to go back on bipap 2. ESRD, usual HD is TTS- very poor compliance, has a father per outpt HD staff but he is not able to make pt comply with his HD treatments 3. HTN/volume- on multiple BP meds 4. Anemia of ESRD- given added  aranesp (in place of EPO) and will  check iron stores  5. Metabolic Bone Disease: renvela once eating- check PTH and phos 6. Possible sepsis/PNA- blood cx's negative, abx stopped, fevers better (102.3 at admission)   Vinson Moselle  MD [phone] pgr    [phone] cell 04/22/2013, 9:41 AM

## 2013-04-22 NOTE — Progress Notes (Signed)
Utilization review completed.  

## 2013-04-22 NOTE — Progress Notes (Signed)
Throughout shift pt has been repeatedly non-compliant. It is now questionable as to whether pt is truly confused or just highly non-compliant in responses and mannerisms. Pt consistently desires to get out of bed despite obvious lack of balance, coordination, or sound judgment to recognize physical limitations associated with hospital course. Pt is currently refusing Bi-Pap despite wheezes in all lung fields bilaterally and dyspnea at rest. Pt expresses understanding of current therapies when explained but remains non-compliant despite that understanding. Will continue to monitor and assess situation. MD aware of pt's current mental status and non-compliance.

## 2013-04-22 NOTE — Progress Notes (Signed)
Pt returned from Dialysis. Per HD RN, unable to perform dialysis due to pt's non-compliance, tripod stance, and general safety concerns. Pt returned to room. Will continue to monitor and assess. Day RN, Carollee Herter Love aware of pt's situation.

## 2013-04-22 NOTE — Progress Notes (Addendum)
Paged R. Reidler of [facility] (Attending Service) regarding pt's hypertension (BPs 170s-180s/100s-110s both manual and automatic) despite administration of PRN Labetalol order. Also spoke with Attending on-call regarding pt's mental status (pt oriented to self only, lethargic, and confused). Discussed with Attending on-call the possibility of confusion being secondary to Ativan administration (although it has been administered for multiple days with out documented side effects) or secondary to hypoxia (@2300  when my shift started, pt had labored breathing, SpO2 85-90% on 6L: pt was consequently placed on Bi-Pap but confusion has not resolved). Reidler endorsed she would review pt's chart and call back with further guidance. Will continue to monitor and assess.  R. Reidler of [facility] called back and no new orders received. She did verbalize to call back if SBP>200. Will continue to monitor and assess.

## 2013-04-22 NOTE — Procedures (Signed)
I was present at this dialysis session. I have reviewed the session itself and made appropriate changes.   Rob Noha Karasik, MD [facility] 04/22/2013, 3:34 PM   
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
## 2013-04-22 NOTE — Progress Notes (Signed)
Subjective: Patient complains that he hurts all over.  Breathing short at times. Objective: Filed Vitals:   04/22/13 0430 04/22/13 0515 04/22/13 0530 04/22/13 0755  BP:  193/129 174/117   Pulse: 92 111 96   Temp:    98.6 F (37 C)  TempSrc:    Oral  Resp: 30 24 19    Height:      Weight:      SpO2: 93% 93% 96%    Weight change: -5 lb 11.7 oz (-2.6 kg)  Intake/Output Summary (Last 24 hours) at 04/22/13 1610 Last data filed at 04/22/13 0600  Gross per 24 hour  Intake 1287.66 ml  Output      0 ml  Net 1287.66 ml    General=  Patient curled up in bed   Head:  Periorbital edema Neck:  Patient curled up  Unable to assess JVP Heart: Regular rate and rhythm, without murmurs, rubs, gallops.  Lungs: Clear to auscultation.  No rales or wheezes. Exemities:  2+ edema Neuro: Grossly intact, nonfocal.  Tele:  SR  Lab Results: Results for orders placed during the hospital encounter of 04/19/13 (from the past 24 hour(s))  LACTIC ACID, PLASMA     Status: Abnormal   Collection Time    04/21/13  8:25 AM      Result Value Range   Lactic Acid, Venous 3.8 (*) 0.5 - 2.2 mmol/L  HEPARIN LEVEL (UNFRACTIONATED)     Status: Abnormal   Collection Time    04/21/13 12:45 PM      Result Value Range   Heparin Unfractionated 0.17 (*) 0.30 - 0.70 IU/mL  HEPARIN LEVEL (UNFRACTIONATED)     Status: Abnormal   Collection Time    04/21/13  9:58 PM      Result Value Range   Heparin Unfractionated 0.15 (*) 0.30 - 0.70 IU/mL  HEPARIN LEVEL (UNFRACTIONATED)     Status: Abnormal   Collection Time    04/22/13  4:40 AM      Result Value Range   Heparin Unfractionated 0.18 (*) 0.30 - 0.70 IU/mL  CBC     Status: Abnormal   Collection Time    04/22/13  4:40 AM      Result Value Range   WBC 12.3 (*) 4.0 - 10.5 K/uL   RBC 2.74 (*) 4.22 - 5.81 MIL/uL   Hemoglobin 8.4 (*) 13.0 - 17.0 g/dL   HCT 96.0 (*) 45.4 - 09.8 %   MCV 90.5  78.0 - 100.0 fL   MCH 30.7  26.0 - 34.0 pg   MCHC 33.9  30.0 - 36.0 g/dL   RDW 11.9 (*) 14.7 - 82.9 %   Platelets 231  150 - 400 K/uL    Studies/Results: @RISRSLT24 @  Medications: Reviewed   @PROBHOSP @  1.  NSTEMI   Patient hurting all over, not distinctly precordial  WIth his current condition I would recomm stopping heparin.  Risks outweigh benefits at present.  Switch NTG to paste With his continued noncompliance, plan for conservative Rx.    2.  HTN  BP remains signif elevated  Patient not compliant with dialysis  I have increased Clonidine and Hydralazine for now  They can be tapered back when volume improves.  3  ICM   LVEF 30 to 35% Acute on chronic systolic CHF Volume status increased with noncompliance  3.  HL  Not tolerant to statins (rash reported on zocor)    4.  Echo abnormality  Echo in [facility] 1 month ago  showed abnormality in [place]  [facility] he clinical status improves I would recomm repeat TTE to evaluate  5.  REnal  Events noted  Renal service following.  6.  Psych  Question if psych service should be contacted  LOS: 3 days   Dietrich Pates 04/22/2013, 8:14 AM

## 2013-04-22 NOTE — Progress Notes (Signed)
[facility] Progress Note [place] TEAM 1 - Stepdown/ICU TEAM   [name] ZOX:[id number] DOB: 1976/02/18 DOA: 04/19/2013 PCP: Isabella Stalling, MD  Brief narrative: 37 year old man reported to the emergency department with lethargy and shortness of breath. Initial evaluation was notable for marked hypertension, hypoxia and obvious volume overload confirmed with chest x-ray. BiPAP was initiated and dialysis was planned while blood work and further testing was in process. Hospitalists were consulted at that time for further recommendations.  History obtained from chart and EDP. Has complex medical history including end-stage renal disease, ischemic cardiomyopathy, COPD and noncompliance. Patient unable to provide any history. By report he had hemodialysis 5/1. He was brought to the emergency department for increasing shortness of breath, facial and leg swelling. Family reported patient was lethargic and simply sat in a chair most of the day.  Chart review is notable for 3 admissions in the last 6 months. Long history of noncompliance medically. Sometimes ends dialysis early and is noncompliant with fluid intake.  After Hospitalist's initial evaluation further information became available including troponin 1.61, lactic acid 4.9. Given his complex history and known heart disease Cardiology consultation is warranted and plans were made for transfer to [facility].   Assessment/Plan:  Acute hypoxic respiratory failure Secondary to pulmonary edema and massive volume overload due to noncompliance w/ HD - ongoing daily hemodialysis per Nephrology - cont to require intermittent BIPAP   Acute systolic just heart failure with massive volume overload/anasarca Ongoing daily hemodialysis - manage blood pressure with HD and meds as able (pt often refuses to comply/take meds) - nitroglycerin changed to past per Cardiology   Hypertensive emergency Adjusting tx regimen, but pt often times refuses meds -  suspect this regimen can be simplified as further volume removed via dialysis  Elevated troponin without EKG changes - NSTEMI Suspect demand ischemia secondary to pulmonary edema but cannot exclude ACS - Cardiology following - Aspirin, beta blocker, heparin drip, nitroglycerin - pt would be a poor candidate for further study at this time due to his very poor compliance w/ tx, making use of stents unsafe   Lactic acidosis Suspected to be reflective of hypoperfusion secondary to volume overload rather than sepsis   End-stage renal disease on hemodialysis Tuesday Thursday Saturday Nephrology following - long-term compliance proving to be an issue (see nephrology note)  Ischemic cardiomyopathy plan as above - stent to circumflex and RCA 12/2008 - Left ventricular ejection fraction 30-35% 03/2013  ?echodensity in chordal region of posterior mitral leaflet + possible LA mass Will need further evaluation once more stable - see Cards notes - repeat echo to be ordered when volume status more stable  History of COPD No evidence of acute exacerbation at this time  History of noncompliance As documented in history of present illness - the patient reportedly routinely cuts his dialysis sessions short in refuses to take his medications when at home - this is placing him in extreme danger and threatening his life (as evidenced by this admission) - is not clear to me if this patient possesses capacity to make decisions for himself - it will be important to know this in determining the appropriateness of ongoing dialysis treatments - I have asked psychiatry to evaluate the patient and to make a determination regarding his capacity - it is also my hope that better control of his psychiatric illness could lead to improved compliance - perhaps the patient has reached a point where living in a facility would be most appropriate  Schizophrenia +/-  bipolar disorder Likely contributing to compliance issues - have  resumed home treatment regimen - psychiatry consultation requested today  Code Status: FULL Family Communication: no family present at time of exam Disposition Plan: SDU  Consultants: Nephrology Cardiology Psychiatry  Procedures: none  Antibiotics: Zosyn 5/2 >>5/4 Vanc 5/2>>5/4  DVT prophylaxis: heparin  HPI/Subjective: The patient is alert and interactive but also quite agitated.  He makes multiple attempts to get up out of bed and consistently says "I just need to go home."  He denies specific complaints but when really pushed we'll admit that he is short of breath.  He has been refusing to take medications on a regular basis and is intermittently attempting to remove his BiPAP mask.  Objective: Blood pressure 169/103, pulse 101, temperature 98.6 F (37 C), temperature source Oral, resp. rate 21, height 5\' 8"  (1.727 m), weight 90.2 kg (198 lb 13.7 oz), SpO2 94.00%.  Intake/Output Summary (Last 24 hours) at 04/22/13 1016 Last data filed at 04/22/13 0700  Gross per 24 hour  Intake 968.16 ml  Output      0 ml  Net 968.16 ml   Exam: General: No acute respiratory distress at this time - periorbital/facial edema persists (likey due to the fact pt often chooses to "sit" in a head down position in bed) Lungs: diffuse crackles persist- no wheeze  Cardiovascular: distant hs - regular rate and rythmn Abdomen: Nontender, nondistended, soft, bowel sounds positive, no rebound, no appreciable mass Extremities: 3++ bilateral lower extremity edema with changes of venous stasis dermatitis  Data Reviewed: Basic Metabolic Panel:  Recent Labs Lab 04/19/13 1720 04/20/13 0240 04/20/13 1330 04/21/13 0243  NA 132*  --  136 138  K 5.3*  --  4.7 3.9  CL 88*  --  92* 90*  CO2 20  --  24 26  GLUCOSE 144*  --  118* 104*  BUN 49*  --  42* 33*  CREATININE 5.82* 3.80* 4.56* 3.18*  CALCIUM 10.2  --  10.1 10.0  PHOS  --   --  6.5*  --    CBC:  Recent Labs Lab 04/19/13 1720  04/20/13 0240 04/21/13 0243 04/22/13 0440  WBC 6.5 10.0 8.8 12.3*  NEUTROABS 5.4  --   --   --   HGB 9.4* 9.2* 8.1* 8.4*  HCT 28.4* 27.5* 24.1* 24.8*  MCV 95.0 90.2 91.6 90.5  PLT 187 175 185 231   Cardiac Enzymes:  Recent Labs Lab 04/19/13 1720 04/20/13 0137 04/20/13 0645  TROPONINI 1.61* 4.21* 6.03*   BNP (last 3 results)  Recent Labs  02/18/13 1402 03/21/13 1843  PROBNP >70000.0* >70000.0*    Recent Results (from the past 240 hour(s))  MRSA PCR SCREENING     Status: None   Collection Time    04/19/13  8:31 PM      Result Value Range Status   MRSA by PCR NEGATIVE  NEGATIVE Final   Comment:            The GeneXpert MRSA Assay (FDA     approved for NASAL specimens     only), is one component of a     comprehensive MRSA colonization     surveillance program. It is not     intended to diagnose MRSA     infection nor to guide or     monitor treatment for     MRSA infections.  CULTURE, BLOOD (ROUTINE X 2)     Status: None   Collection Time  04/20/13  1:00 AM      Result Value Range Status   Specimen Description BLOOD DIALYSIS   Final   Special Requests     Final   Value: BOTTLES DRAWN AEROBIC AND ANAEROBIC 10CC EACH DRAWN FROM DIALYSIS LINE   Culture  Setup Time 04/20/2013 12:05   Final   Culture     Final   Value:        BLOOD CULTURE RECEIVED NO GROWTH TO DATE CULTURE WILL BE HELD FOR 5 DAYS BEFORE ISSUING A FINAL NEGATIVE REPORT   Report Status PENDING   Incomplete  CULTURE, BLOOD (ROUTINE X 2)     Status: None   Collection Time    04/20/13  2:40 AM      Result Value Range Status   Specimen Description BLOOD RIGHT HAND   Final   Special Requests BOTTLES DRAWN AEROBIC ONLY 6CC   Final   Culture  Setup Time 04/20/2013 12:06   Final   Culture     Final   Value:        BLOOD CULTURE RECEIVED NO GROWTH TO DATE CULTURE WILL BE HELD FOR 5 DAYS BEFORE ISSUING A FINAL NEGATIVE REPORT   Report Status PENDING   Incomplete  URINE CULTURE     Status: None    Collection Time    04/20/13 11:00 AM      Result Value Range Status   Specimen Description URINE, CATHETERIZED   Final   Special Requests Normal   Final   Culture  Setup Time 04/20/2013 11:00   Final   Colony Count NO GROWTH   Final   Culture NO GROWTH   Final   Report Status 04/21/2013 FINAL   Final     Studies:  Recent x-ray studies have been reviewed in detail by the Attending Physician  Scheduled Meds:  Scheduled Meds: . aspirin  325 mg Oral Daily  . carvedilol  25 mg Oral BID WC  . cloNIDine  0.3 mg Oral Q8H  . darbepoetin (ARANESP) injection - DIALYSIS  100 mcg Intravenous Q Sat-HD  . DULoxetine  60 mg Oral Daily  . heparin subcutaneous  5,000 Units Subcutaneous Q8H  . hydrALAZINE  100 mg Oral Q8H  . lisinopril  20 mg Oral BID  . LORazepam  2 mg Intravenous Q4H  . multivitamin  1 tablet Oral QHS  . nitroGLYCERIN  1 inch Topical Custom  . OLANZapine  10 mg Oral BID  . pantoprazole  40 mg Oral Q1200  . sevelamer carbonate  1,600 mg Oral TID WC   Continuous Infusions: . nitroGLYCERIN Stopped (04/22/13 0745)    Time spent on care of this patient:   [facility] T  [facility] Office  [phone] Pager - Text Page per Loretha Stapler as per below:  On-Call/Text Page:      [email or web address]      password [email or web address]  If 7PM-7AM, please contact night-coverage [email or web address] Password [email or web address] 04/22/2013, 10:16 AM   LOS: 3 days

## 2013-04-23 DIAGNOSIS — F319 Bipolar disorder, unspecified: Secondary | ICD-10-CM

## 2013-04-23 LAB — FERRITIN: Ferritin: 3170 ng/mL — ABNORMAL HIGH (ref 22–322)

## 2013-04-23 MED ORDER — LORAZEPAM 2 MG/ML IJ SOLN
INTRAMUSCULAR | Status: AC
  Filled 2013-04-23: qty 1

## 2013-04-23 MED ORDER — CARVEDILOL 12.5 MG PO TABS
12.5000 mg | ORAL_TABLET | Freq: Two times a day (BID) | ORAL | Status: DC
  Administered 2013-04-24: 12.5 mg via ORAL
  Filled 2013-04-23 (×4): qty 1

## 2013-04-23 MED ORDER — CLONIDINE HCL 0.2 MG PO TABS
0.2000 mg | ORAL_TABLET | Freq: Two times a day (BID) | ORAL | Status: DC
  Administered 2013-04-23 – 2013-04-24 (×2): 0.2 mg via ORAL
  Filled 2013-04-23 (×3): qty 1

## 2013-04-23 MED ORDER — WHITE PETROLATUM GEL
Status: AC
  Administered 2013-04-23: 1
  Filled 2013-04-23: qty 5

## 2013-04-23 MED ORDER — HEPARIN SODIUM (PORCINE) 1000 UNIT/ML DIALYSIS: 20.0000 [IU]/kg | INTRAMUSCULAR | Status: DC | PRN

## 2013-04-23 MED ORDER — ISOSORBIDE MONONITRATE ER 30 MG PO TB24
30.0000 mg | ORAL_TABLET | Freq: Every day | ORAL | Status: DC
  Administered 2013-04-24: 30 mg via ORAL
  Filled 2013-04-23: qty 1

## 2013-04-23 MED ORDER — HYDRALAZINE HCL 50 MG PO TABS
50.0000 mg | ORAL_TABLET | Freq: Three times a day (TID) | ORAL | Status: DC
  Administered 2013-04-23 – 2013-04-24 (×2): 50 mg via ORAL
  Filled 2013-04-23 (×6): qty 1

## 2013-04-23 NOTE — Progress Notes (Signed)
Subjective:  Much better today, calm, on 4L Keys  Objective Vital signs in last 24 hours: Filed Vitals:   04/23/13 0739 04/23/13 0800 04/23/13 0917 04/23/13 1206  BP:  164/97 164/97   Pulse:      Temp: 97.6 F (36.4 C) 97.6 F (36.4 C)  98.5 F (36.9 C)  TempSrc: Axillary Axillary  Oral  Resp:      Height:      Weight:      SpO2:       Weight change: 0.8 kg (1 lb 12.2 oz)  Intake/Output Summary (Last 24 hours) at 04/23/13 1251 Last data filed at 04/23/13 0600  Gross per 24 hour  Intake    600 ml  Output   5205 ml  Net  -4605 ml   Labs: Basic Metabolic Panel:  Recent Labs Lab 04/20/13 1330 04/21/13 0243 04/22/13 1435  NA 136 138 133*  K 4.7 3.9 5.1  CL 92* 90* 92*  CO2 24 26 22   GLUCOSE 118* 104* 104*  BUN 42* 33* 70*  CREATININE 4.56* 3.18* 5.03*  CALCIUM 10.1 10.0 10.2  PHOS 6.5*  --  5.8*   Liver Function Tests:  Recent Labs Lab 04/20/13 1330 04/22/13 1435  ALBUMIN 3.1* 3.1*   No results found for this basename: LIPASE, AMYLASE,  in the last 168 hours No results found for this basename: AMMONIA,  in the last 168 hours CBC:  Recent Labs Lab 04/19/13 1720 04/20/13 0240 04/21/13 0243 04/22/13 0440 04/22/13 1435  WBC 6.5 10.0 8.8 12.3* 11.5*  NEUTROABS 5.4  --   --   --   --   HGB 9.4* 9.2* 8.1* 8.4* 8.5*  HCT 28.4* 27.5* 24.1* 24.8* 24.7*  MCV 95.0 90.2 91.6 90.5 89.8  PLT 187 175 185 231 245   Cardiac Enzymes:  Recent Labs Lab 04/19/13 1720 04/20/13 0137 04/20/13 0645  TROPONINI 1.61* 4.21* 6.03*   CBG: No results found for this basename: GLUCAP,  in the last 168 hours  Iron Studies:   Recent Labs  04/22/13 1435  IRON 151*  TIBC 200*  FERRITIN 3170*   Studies/Results: No results found. Medications: Infusions:    Scheduled Medications: . aspirin  325 mg Oral Daily  . carvedilol  25 mg Oral BID WC  . cloNIDine  0.3 mg Oral Q8H  . darbepoetin (ARANESP) injection - DIALYSIS  100 mcg Intravenous Q Sat-HD  . DULoxetine  60  mg Oral Daily  . heparin subcutaneous  5,000 Units Subcutaneous Q8H  . hydrALAZINE  100 mg Oral Q8H  . lisinopril  20 mg Oral BID  . LORazepam  2 mg Intravenous Q4H  . multivitamin  1 tablet Oral QHS  . nitroGLYCERIN  1 inch Topical Custom  . OLANZapine  10 mg Oral BID  . pantoprazole  40 mg Oral Q1200  . sevelamer carbonate  1,600 mg Oral TID WC    have reviewed scheduled and prn medications.  Physical Exam: General: calm, arouseable, interacts Heart: reg no rub or gallop Lungs: CBS bilat Abdomen: soft, non tender Extremities: edema resolved Dialysis Access: left upper arm AVF- good thrill and bruit- no infiltration !  Outpatient HD: [facility] TTS 4.5hrs  82kg  2K/2.5Ca  Hep about 3000 units total  EPO 14,300  Hect 7ug  Summary:  37 year old WM with ESRD and cardiomyopathy with medical noncompliance, massive anasarca and positive troponins   Assessment/Plan: 1. Volume overload/pulmonary edema- much better today. Plan HD this afternoon. OK for d/c  after HD today from renal standpoint.  2. ESRD, usual HD TTS 3. HTN/volume- reduced BP meds back to home doses  4. Anemia of ESRD- given added  aranesp (in place of EPO) 5. Metabolic Bone Disease: renvela once eating- check PTH and phos 6. Possible sepsis/PNA- blood cx's negative, abx stopped, fevers better (102.3 at admission)   Vinson Moselle  MD [phone] pgr    [phone] cell 04/23/2013, 12:51 PM

## 2013-04-23 NOTE — Progress Notes (Signed)
Pt keeps attempting to climb out of bed to "go smoke". He refuses safety education and refuses to remain in the bed. NP on call notified. New orders received for soft waist restraint. Will continue to educate and monitor the patient.

## 2013-04-23 NOTE — Progress Notes (Signed)
Patient ID: [name], male   DOB: 23-Jul-1976, 37 y.o.   MRN: [id number] Subjective: Myalgias,  Chronic dyspnea  Objective: Filed Vitals:   04/22/13 2309 04/23/13 0300 04/23/13 0336 04/23/13 0500  BP: 164/97  157/96   Pulse: 80 71 55   Temp: 97.7 F (36.5 C)  97.9 F (36.6 C)   TempSrc: Oral  Axillary   Resp: 21  16   Height:      Weight:    186 lb 1.1 oz (84.4 kg)  SpO2: 97% 97%     Weight change: 1 lb 12.2 oz (0.8 kg)  Intake/Output Summary (Last 24 hours) at 04/23/13 0729 Last data filed at 04/23/13 0600  Gross per 24 hour  Intake    600 ml  Output   5205 ml  Net  -4605 ml    General=  Patient curled up in bed   Head:  Periorbital edema Neck:  Patient curled up  Unable to assess JVP Heart: Regular rate and rhythm, without murmurs, rubs, gallops.  Lungs: Clear to auscultation.  No rales or wheezes. Exemities:  2+ edema Neuro: Grossly intact, nonfocal.  Tele:  SR  Lab Results: Results for orders placed during the hospital encounter of 04/19/13 (from the past 24 hour(s))  CBC     Status: Abnormal   Collection Time    04/22/13  2:35 PM      Result Value Range   WBC 11.5 (*) 4.0 - 10.5 K/uL   RBC 2.75 (*) 4.22 - 5.81 MIL/uL   Hemoglobin 8.5 (*) 13.0 - 17.0 g/dL   HCT 40.9 (*) 81.1 - 91.4 %   MCV 89.8  78.0 - 100.0 fL   MCH 30.9  26.0 - 34.0 pg   MCHC 34.4  30.0 - 36.0 g/dL   RDW 78.2 (*) 95.6 - 21.3 %   Platelets 245  150 - 400 K/uL  RENAL FUNCTION PANEL     Status: Abnormal   Collection Time    04/22/13  2:35 PM      Result Value Range   Sodium 133 (*) 135 - 145 mEq/L   Potassium 5.1  3.5 - 5.1 mEq/L   Chloride 92 (*) 96 - 112 mEq/L   CO2 22  19 - 32 mEq/L   Glucose, Bld 104 (*) 70 - 99 mg/dL   BUN 70 (*) 6 - 23 mg/dL   Creatinine, Ser 0.86 (*) 0.50 - 1.35 mg/dL   Calcium 57.8  8.4 - 46.9 mg/dL   Phosphorus 5.8 (*) 2.3 - 4.6 mg/dL   Albumin 3.1 (*) 3.5 - 5.2 g/dL   GFR calc non Af Amer 13 (*) >90 mL/min   GFR calc Af Amer 15 (*) >90 mL/min  IRON  AND TIBC     Status: Abnormal   Collection Time    04/22/13  2:35 PM      Result Value Range   Iron 151 (*) 42 - 135 ug/dL   TIBC 629 (*) 528 - 413 ug/dL   Saturation Ratios 76 (*) 20 - 55 %   UIBC 49 (*) 125 - 400 ug/dL  FERRITIN     Status: Abnormal   Collection Time    04/22/13  2:35 PM      Result Value Range   Ferritin 3170 (*) 22 - 322 ng/mL    Studies/Results: @RISRSLT24 @  Medications: Reviewed   @PROBHOSP @  1.  NSTEMI   Heparin stopped yesterday and nitro paste used. Medical Rx  2.  HTN  BP remains signif elevated  Patient not compliant with dialysis  Clonidine and hydralazine increased  They can be tapered back when volume improves.  3  ICM   LVEF 30 to 35% Acute on chronic systolic CHF Volume status increased with noncompliance  3.  HL  Not tolerant to statins (rash reported on zocor)    4.  Echo abnormality  Echo in [facility] 1 month ago showed abnormality in [place]  [facility] he clinical status improves I would recomm repeat TTE to evaluate  5.  REnal  Events noted  Renal service following.  6.  Psych  See note from consultatnt    LOS: 4 days   Charlton Haws 04/23/2013, 7:29 AM

## 2013-04-23 NOTE — Progress Notes (Addendum)
[facility] Progress Note [place] TEAM 1 - Stepdown/ICU TEAM   [name] WUJ:[id number] DOB: [birth year] DOA: 04/19/2013 PCP: Isabella Stalling, MD  Brief narrative: 37 year old man reported to the emergency department with lethargy and shortness of breath. Initial evaluation was notable for marked hypertension, hypoxia and obvious volume overload confirmed with chest x-ray. BiPAP was initiated and dialysis was planned while blood work and further testing was in process. Hospitalists were consulted at that time for further recommendations.  History obtained from chart and EDP. Has complex medical history including end-stage renal disease, ischemic cardiomyopathy, COPD and noncompliance. Patient unable to provide any history. By report he had hemodialysis 5/1. He was brought to the emergency department for increasing shortness of breath, facial and leg swelling. Family reported patient was lethargic and simply sat in a chair most of the day.  Chart review is notable for 3 admissions in the last 6 months. Long history of noncompliance medically. Sometimes ends dialysis early and is noncompliant with fluid intake.  After Hospitalist's initial evaluation further information became available including troponin 1.61, lactic acid 4.9. Given his complex history and known heart disease Cardiology consultation is warranted and plans were made for transfer to [facility].   Assessment/Plan:  Acute hypoxic respiratory failure Secondary to pulmonary edema and massive volume overload due to noncompliance w/ HD - ongoing daily hemodialysis per Nephrology -Per Nephrology, pt can go home in AM if  CXR no longer shows pulmonary edema- pt being dialyzed today  Acute systolic just heart failure with massive volume overload/anasarca Ongoing daily hemodialysis - manage blood pressure with HD and meds as able (pt often refuses to comply/take meds) -   Hypertensive emergency Adjusting tx regimen, but pt often times  refuses meds -   Elevated troponin without EKG changes - NSTEMI Suspect demand ischemia secondary to pulmonary edema but cannot exclude ACS - Cardiology following - Aspirin, beta blocker-change Nitro ointment back to Imdur - pt would be a poor candidate for further study at this time due to his very poor compliance w/ tx  Lactic acidosis Suspected to be reflective of hypoperfusion secondary to volume overload rather than sepsis   End-stage renal disease on hemodialysis Tuesday Thursday Saturday Nephrology following - long-term compliance proving to be an issue (see nephrology note)  Ischemic cardiomyopathy plan as above - stent to circumflex and RCA 12/2008 - Left ventricular ejection fraction 30-35% 03/2013  ?echodensity in chordal region of posterior mitral leaflet + possible LA mass Will need further evaluation once more stable - see Cards notes - repeat echo as outpt  History of COPD No evidence of acute exacerbation at this time  History of noncompliance As documented in history of present illness - the patient reportedly routinely cuts his dialysis sessions short in refuses to take his medications when at home - this is placing him in extreme danger and threatening his life (as evidenced by this admission) - SEE BELOW IN REGARD TO PSYCH EVAL  Schizophrenia +/- bipolar disorder Likely contributing to compliance issues - have resumed home treatment regimen - psychiatry consultation requested - Pt does not have capacity to make his own decisions- explained this to pts father- if the patient tells the dialysis nurses to stop dialysis early, they are not to comply with his requests. His father states that overall, he is able to convince him to take his medications.  Code Status: FULL Family Communication: spoke with father today- he is comfortable in letting him come back home and he will  be managing his meds and transporting him to dialysis as he usually does Disposition Plan: transfer  to floor bed- home in AM?  Consultants: Nephrology Cardiology Psychiatry  Procedures: none  Antibiotics: Zosyn 5/2 >>5/4 Vanc 5/2>>5/4  DVT prophylaxis: heparin  HPI/Subjective: The patient is alert and interactive but quite restless and asking to go home- continues to require restraints.   Objective: Blood pressure 147/76, pulse 76, temperature 97.7 F (36.5 C), temperature source Oral, resp. rate 18, height 5\' 8"  (1.727 m), weight 87 kg (191 lb 12.8 oz), SpO2 100.00%.  Intake/Output Summary (Last 24 hours) at 04/23/13 1609 Last data filed at 04/23/13 0600  Gross per 24 hour  Intake    600 ml  Output   5205 ml  Net  -4605 ml   Exam: General: No acute respiratory distress at this time  Lungs: diffuse crackles persist- no wheeze  Cardiovascular: distant hs - regular rate and rythmn Abdomen: Nontender, nondistended, soft, bowel sounds positive, no rebound, no appreciable mass Extremities: improved lower extremity edema with changes of venous stasis dermatitis  Data Reviewed: Basic Metabolic Panel:  Recent Labs Lab 04/19/13 1720 04/20/13 0240 04/20/13 1330 04/21/13 0243 04/22/13 1435  NA 132*  --  136 138 133*  K 5.3*  --  4.7 3.9 5.1  CL 88*  --  92* 90* 92*  CO2 20  --  24 26 22   GLUCOSE 144*  --  118* 104* 104*  BUN 49*  --  42* 33* 70*  CREATININE 5.82* 3.80* 4.56* 3.18* 5.03*  CALCIUM 10.2  --  10.1 10.0 10.2  9.6  PHOS  --   --  6.5*  --  5.8*   CBC:  Recent Labs Lab 04/19/13 1720 04/20/13 0240 04/21/13 0243 04/22/13 0440 04/22/13 1435  WBC 6.5 10.0 8.8 12.3* 11.5*  NEUTROABS 5.4  --   --   --   --   HGB 9.4* 9.2* 8.1* 8.4* 8.5*  HCT 28.4* 27.5* 24.1* 24.8* 24.7*  MCV 95.0 90.2 91.6 90.5 89.8  PLT 187 175 185 231 245   Cardiac Enzymes:  Recent Labs Lab 04/19/13 1720 04/20/13 0137 04/20/13 0645  TROPONINI 1.61* 4.21* 6.03*   BNP (last 3 results)  Recent Labs  02/18/13 1402 03/21/13 1843  PROBNP >70000.0* >70000.0*     Recent Results (from the past 240 hour(s))  MRSA PCR SCREENING     Status: None   Collection Time    04/19/13  8:31 PM      Result Value Range Status   MRSA by PCR NEGATIVE  NEGATIVE Final   Comment:            The GeneXpert MRSA Assay (FDA     approved for NASAL specimens     only), is one component of a     comprehensive MRSA colonization     surveillance program. It is not     intended to diagnose MRSA     infection nor to guide or     monitor treatment for     MRSA infections.  CULTURE, BLOOD (ROUTINE X 2)     Status: None   Collection Time    04/20/13  1:00 AM      Result Value Range Status   Specimen Description BLOOD DIALYSIS   Final   Special Requests     Final   Value: BOTTLES DRAWN AEROBIC AND ANAEROBIC 10CC EACH DRAWN FROM DIALYSIS LINE   Culture  Setup Time 04/20/2013 12:05   Final  Culture     Final   Value:        BLOOD CULTURE RECEIVED NO GROWTH TO DATE CULTURE WILL BE HELD FOR 5 DAYS BEFORE ISSUING A FINAL NEGATIVE REPORT   Report Status PENDING   Incomplete  CULTURE, BLOOD (ROUTINE X 2)     Status: None   Collection Time    04/20/13  2:40 AM      Result Value Range Status   Specimen Description BLOOD RIGHT HAND   Final   Special Requests BOTTLES DRAWN AEROBIC ONLY 6CC   Final   Culture  Setup Time 04/20/2013 12:06   Final   Culture     Final   Value:        BLOOD CULTURE RECEIVED NO GROWTH TO DATE CULTURE WILL BE HELD FOR 5 DAYS BEFORE ISSUING A FINAL NEGATIVE REPORT   Report Status PENDING   Incomplete  URINE CULTURE     Status: None   Collection Time    04/20/13 11:00 AM      Result Value Range Status   Specimen Description URINE, CATHETERIZED   Final   Special Requests Normal   Final   Culture  Setup Time 04/20/2013 11:00   Final   Colony Count NO GROWTH   Final   Culture NO GROWTH   Final   Report Status 04/21/2013 FINAL   Final     Studies:  Recent x-ray studies have been reviewed in detail by the Attending Physician  Scheduled  Meds:  Scheduled Meds: . aspirin  325 mg Oral Daily  . carvedilol  12.5 mg Oral BID WC  . cloNIDine  0.2 mg Oral BID  . darbepoetin (ARANESP) injection - DIALYSIS  100 mcg Intravenous Q Sat-HD  . DULoxetine  60 mg Oral Daily  . heparin subcutaneous  5,000 Units Subcutaneous Q8H  . hydrALAZINE  50 mg Oral Q8H  . lisinopril  20 mg Oral BID  . LORazepam  2 mg Intravenous Q4H  . multivitamin  1 tablet Oral QHS  . nitroGLYCERIN  1 inch Topical Custom  . OLANZapine  10 mg Oral BID  . pantoprazole  40 mg Oral Q1200  . sevelamer carbonate  1,600 mg Oral TID WC   Continuous Infusions:    Time spent on care of this patient:   Lynnett Langlinais, MD [phone]  [facility] Office  [phone] Pager - Text Page per Amion as per below:  On-Call/Text Page:      [email or web address]      password [email or web address]  If 7PM-7AM, please contact night-coverage [email or web address] Password [email or web address] 04/23/2013, 4:09 PM   LOS: 4 days

## 2013-04-24 ENCOUNTER — Inpatient Hospital Stay (HOSPITAL_COMMUNITY): Payer: Medicare Other

## 2013-04-24 MED ORDER — HYDROCODONE-ACETAMINOPHEN 5-325 MG PO TABS
1.0000 | ORAL_TABLET | ORAL | Status: AC
  Administered 2013-04-24: 1 via ORAL
  Filled 2013-04-24: qty 1

## 2013-04-24 MED ORDER — DARBEPOETIN ALFA-POLYSORBATE 100 MCG/0.5ML IJ SOLN: 100.0000 ug | INTRAMUSCULAR | Status: AC

## 2013-04-24 MED ORDER — ASPIRIN 325 MG PO TABS: 325.0000 mg | ORAL_TABLET | Freq: Every day | ORAL | Status: AC

## 2013-04-24 MED ORDER — SEVELAMER CARBONATE 800 MG PO TABS: 1600.0000 mg | ORAL_TABLET | Freq: Three times a day (TID) | ORAL | Status: AC

## 2013-04-24 NOTE — Progress Notes (Signed)
Patient discharged home per MD order. All discharge instructions given to both patient and patients father, both verbalized an understanding of these instructions. Patient alert and oriented. Will go home with father.

## 2013-04-24 NOTE — Discharge Summary (Signed)
DISCHARGE SUMMARY  [name]  MR#: [id number]  DOB:[birth year]  Date of Admission: 04/19/2013 Date of Discharge: 04/24/2013  Attending Physician:MCCLUNG,JEFFREY T  Patient's WUJ:[name],[name], [name]  Consults: [facility] Cardiology Psychiatry  Disposition: D/C home to care of father   Follow-up Appts:     Follow-up Information   Follow up with DONDIEGO,RICHARD [name], [name]. Schedule an appointment as soon as possible for a visit in 5 days.   Contact information:   [address] [place] [address] [phone]       Follow up with [place] Bing, [name]. Schedule an appointment as soon as possible for a visit in 10 days.   Contact information:   [address] [place] [address] [phone]      Tests Needing Follow-up: 1 - pt will require an outpt TTE to evaluate a ?echodensity in chordal region of posterior mitral leaflet + possible LA mass 2 - recheck of Fe indices/ferritin should be accomplished in 4-6 weeks to r/o Fe overload   Discharge Diagnoses: Acute hypoxic respiratory failure  Acute systolic just heart failure with massive volume overload/anasarca  Hypertensive emergency  Elevated troponin without EKG changes - NSTEMI  Lactic acidosis  End-stage renal disease on hemodialysis Tuesday Thursday Saturday  Ischemic cardiomyopathy  ?echodensity in chordal region of posterior mitral leaflet + possible LA mass  History of COPD  History of noncompliance   Schizophrenia +/- bipolar disorder - deemed incompetent to make medical decisions  Initial presentation: 37 year old man who reported to the emergency department with lethargy and shortness of breath. Initial evaluation was notable for marked hypertension, hypoxia and obvious volume overload confirmed with chest x-ray. BiPAP was initiated and dialysis was planned while blood work and further testing was in process.  He was brought to the emergency department for increasing  shortness of breath, facial and leg swelling. Family reported patient was lethargic and simply sat in a chair most of the day.  Chart review was notable for 3 admissions in the last 6 months. Long history of noncompliance medically. Often ends dialysis early and is noncompliant with fluid intake.  After Hospitalist's initial evaluation further information became available including troponin 1.61, lactic acid 4.9. Given his complex history and known heart disease Cardiology consultation was warranted and plans were made for transfer to [facility].   Hospital Course:  Acute hypoxic respiratory failure  Secondary to pulmonary edema and massive volume overload due to noncompliance w/ HD - daily hemodialysis was administered per Nephrology - 15L net negative balance was accomplished, and the pt improved markedly clinically - he has been informed that he has been deemed incompetent to make decisions and that he will not be allowed to cut his hemodialysis treatments short in the outpatient setting - he has been cleared for discharge by the nephrology service and is stable medically  Acute systolic just heart failure with massive volume overload/anasarca  Required daily hemodialysis - managed blood pressure with HD and meds - at time of discharge patient's facial edema has resolved and only 1+ lower extremity edema is appreciable  Hypertensive emergency  Medications have been adjusted and blood pressure is much improved, though not yet at goal - strict compliance with medications and ongoing compliance with dialysis will be required to achieve better control  Elevated troponin without EKG changes - NSTEMI  Suspect demand ischemia secondary to pulmonary edema but could not exclude ACS - Cardiology followed during hospital stay - Aspirin, beta blocker, Imdur - pt would be  a poor candidate for further study due to his very poor compliance w/ tx   Lactic acidosis  Suspected to be reflective of hypoperfusion secondary  to volume overload rather than sepsis   End-stage renal disease on hemodialysis Tuesday Thursday Saturday  Nephrology followed throughout hospital stay - long-term compliance proving to be an issue  Ischemic cardiomyopathy  plan as above - stent to circumflex and RCA 12/2008 - Left ventricular ejection fraction 30-35% 03/2013 - followed by [facility] cardiology as an outpatient and  ?echodensity in chordal region of posterior mitral leaflet + possible LA mass  Will need further evaluation per cardiology - repeat echo indicated as outpt   History of COPD  No evidence of acute exacerbation at this time   History of noncompliance  As documented in history of present illness - the patient reportedly routinely cuts his dialysis sessions short and refuses to take his medications when at home - this is placing him in extreme danger and threatening his life (as evidenced by this admission) - SEE BELOW IN REGARD TO PSYCH EVAL   Schizophrenia +/- bipolar disorder  Likely contributing to compliance issues - resumed home treatment regimen - psychiatry consultation obtained - Pt does not have capacity to make his own decisions - explained this to pts father - if the patient tells the dialysis nurses to stop dialysis early, they are not to comply with his requests - father states that overall, he is able to convince him to take his medications - it was also explained to the patient himself that he would not be allowed to stop dialysis early even if he wanted to    Medication List    STOP taking these medications       labetalol 200 MG tablet  Commonly known as:  NORMODYNE      TAKE these medications       aspirin 325 MG tablet  Take 1 tablet (325 mg total) by mouth daily.     b complex-vitamin c-folic acid 0.8 MG Tabs  Take 0.8 mg by mouth at bedtime.     carvedilol 12.5 MG tablet  Commonly known as:  COREG  Take 12.5 mg by mouth 2 (two) times daily with a meal.     cloNIDine 0.2 MG tablet   Commonly known as:  CATAPRES  Take 0.2 mg by mouth 2 (two) times daily.     darbepoetin 100 MCG/0.5ML Soln  Commonly known as:  ARANESP  Inject 0.5 mLs (100 mcg total) into the vein every Saturday with hemodialysis.     diphenhydrAMINE 25 MG tablet  Commonly known as:  BENADRYL  Take 25 mg by mouth every 6 (six) hours as needed for allergies.     DULoxetine 60 MG capsule  Commonly known as:  CYMBALTA  Take 60 mg by mouth daily.     hydrALAZINE 50 MG tablet  Commonly known as:  APRESOLINE  Take 50 mg by mouth 3 (three) times daily.     isosorbide mononitrate 30 MG 24 hr tablet  Commonly known as:  IMDUR  Take 1 tablet (30 mg total) by mouth daily.     lansoprazole 30 MG capsule  Commonly known as:  PREVACID  Take 1 capsule (30 mg total) by mouth daily.     lisinopril 20 MG tablet  Commonly known as:  PRINIVIL,ZESTRIL  Take 20 mg by mouth 2 (two) times daily.     OLANZapine 20 MG tablet  Commonly known as:  ZYPREXA  Take  10 mg by mouth 2 (two) times daily.     oxyCODONE-acetaminophen 5-325 MG per tablet  Commonly known as:  PERCOCET/ROXICET  Take 1 tablet by mouth 4 (four) times daily.     pravastatin 40 MG tablet  Commonly known as:  PRAVACHOL  Take 40 mg by mouth daily.     rOPINIRole 1 MG tablet  Commonly known as:  REQUIP  Take 1 mg by mouth at bedtime.     sevelamer carbonate 800 MG tablet  Commonly known as:  RENVELA  Take 2 tablets (1,600 mg total) by mouth 3 (three) times daily with meals.     zolpidem 10 MG tablet  Commonly known as:  AMBIEN  Take 10 mg by mouth at bedtime.       Day of Discharge BP 155/103  Pulse 78  Temp(Src) 97.7 F (36.5 C) (Oral)  Resp 15  Ht 5\' 8"  (1.727 [name])  Wt 83.6 kg (184 lb 4.9 oz)  BMI 28.03 kg/m2  SpO2 97%  Physical Exam: General: No acute respiratory distress - facial edema resolved Lungs: Clear to auscultation bilaterally without wheezes or crackles Cardiovascular: Regular rate and rhythm without murmur  gallop or rub  Abdomen: Nontender, nondistended, soft, bowel sounds positive, no rebound, no ascites, no appreciable mass Extremities: No significant cyanosis, or clubbing;  1+ edema bilateral lower extremities with cutaneous change consistent with chronic venous stasis dermatitis  Time spent in discharge (includes decision making & examination of pt): >30 minutes  04/24/2013, 11:26 AM   Lonia Blood, [name] [facility] Office  [phone] Pager [phone]  On-Call/Text Page:      [email or web address]      password [facility]

## 2013-04-25 ENCOUNTER — Telehealth: Payer: Self-pay | Admitting: *Deleted

## 2013-04-25 NOTE — Telephone Encounter (Addendum)
Received forwarded message from Dr. Tenny Craw: advised pt is Dr Macarthur Critchley pt and will need to be seen by Dr Macarthur Critchley; recent ED visit (04-19-13 )  ----- Message -----    From: Waymon Budge, LPN    Sent: 12/24/1094 12:45 PM    To: Alois Cliche, LPN, Sherrilyn Rist    Subject: Appt         PER DR. ROSS: This is Rob Rothbart's patient and should be scheduled to follow with him in [place].  She saw the pt on hospital rounds only.        Thanks,    [employer]

## 2013-04-26 LAB — CULTURE, BLOOD (ROUTINE X 2)
Culture: NO GROWTH
Culture: NO GROWTH

## 2013-04-26 NOTE — Telephone Encounter (Addendum)
Scheduled pt for f/u on 05-16-13 at 3pm with Dr RR, left vm on pt home number about apt date/time; advised by Dr Tenny Craw that patient unable to manage his own affairs; left vm for patient's father Gerarda Gunther to call office to confirm apt.

## 2013-04-27 NOTE — Telephone Encounter (Signed)
Noted.  Marki Frede, MD 

## 2013-04-29 NOTE — Telephone Encounter (Signed)
Spoke to pt father and pt has written apt down to be seen in office, will keep apt as advised

## 2013-05-03 ENCOUNTER — Other Ambulatory Visit (HOSPITAL_COMMUNITY): Payer: Self-pay | Admitting: Family Medicine

## 2013-05-03 ENCOUNTER — Emergency Department (HOSPITAL_COMMUNITY)
Admission: EM | Admit: 2013-05-03 | Discharge: 2013-05-03 | Disposition: A | Discharge status: Home / Self Care | Payer: Medicare Other | Attending: Emergency Medicine | Admitting: Emergency Medicine

## 2013-05-03 ENCOUNTER — Encounter (HOSPITAL_COMMUNITY): Payer: Self-pay | Admitting: *Deleted

## 2013-05-03 ENCOUNTER — Ambulatory Visit (HOSPITAL_COMMUNITY)
Admission: RE | Admit: 2013-05-03 | Discharge: 2013-05-03 | Disposition: A | Discharge status: Home / Self Care | Payer: Medicare Other | Source: Ambulatory Visit | Attending: Family Medicine | Admitting: Family Medicine

## 2013-05-03 ENCOUNTER — Encounter (HOSPITAL_COMMUNITY): Payer: Self-pay

## 2013-05-03 DIAGNOSIS — G894 Chronic pain syndrome: Secondary | ICD-10-CM | POA: Insufficient documentation

## 2013-05-03 DIAGNOSIS — Z888 Allergy status to other drugs, medicaments and biological substances status: Secondary | ICD-10-CM | POA: Insufficient documentation

## 2013-05-03 DIAGNOSIS — N390 Urinary tract infection, site not specified: Secondary | ICD-10-CM | POA: Insufficient documentation

## 2013-05-03 DIAGNOSIS — R319 Hematuria, unspecified: Secondary | ICD-10-CM

## 2013-05-03 DIAGNOSIS — F209 Schizophrenia, unspecified: Secondary | ICD-10-CM | POA: Insufficient documentation

## 2013-05-03 DIAGNOSIS — Z8701 Personal history of pneumonia (recurrent): Secondary | ICD-10-CM | POA: Insufficient documentation

## 2013-05-03 DIAGNOSIS — J449 Chronic obstructive pulmonary disease, unspecified: Secondary | ICD-10-CM | POA: Insufficient documentation

## 2013-05-03 DIAGNOSIS — Z862 Personal history of diseases of the blood and blood-forming organs and certain disorders involving the immune mechanism: Secondary | ICD-10-CM | POA: Insufficient documentation

## 2013-05-03 DIAGNOSIS — F17200 Nicotine dependence, unspecified, uncomplicated: Secondary | ICD-10-CM | POA: Insufficient documentation

## 2013-05-03 DIAGNOSIS — N186 End stage renal disease: Secondary | ICD-10-CM | POA: Insufficient documentation

## 2013-05-03 DIAGNOSIS — F319 Bipolar disorder, unspecified: Secondary | ICD-10-CM | POA: Insufficient documentation

## 2013-05-03 DIAGNOSIS — Z885 Allergy status to narcotic agent status: Secondary | ICD-10-CM | POA: Insufficient documentation

## 2013-05-03 DIAGNOSIS — G43909 Migraine, unspecified, not intractable, without status migrainosus: Secondary | ICD-10-CM | POA: Insufficient documentation

## 2013-05-03 DIAGNOSIS — I120 Hypertensive chronic kidney disease with stage 5 chronic kidney disease or end stage renal disease: Secondary | ICD-10-CM | POA: Insufficient documentation

## 2013-05-03 DIAGNOSIS — J4489 Other specified chronic obstructive pulmonary disease: Secondary | ICD-10-CM | POA: Insufficient documentation

## 2013-05-03 DIAGNOSIS — Z7982 Long term (current) use of aspirin: Secondary | ICD-10-CM | POA: Insufficient documentation

## 2013-05-03 DIAGNOSIS — Z79899 Other long term (current) drug therapy: Secondary | ICD-10-CM | POA: Insufficient documentation

## 2013-05-03 DIAGNOSIS — Z8679 Personal history of other diseases of the circulatory system: Secondary | ICD-10-CM | POA: Insufficient documentation

## 2013-05-03 LAB — URINALYSIS, ROUTINE W REFLEX MICROSCOPIC
Glucose, UA: 100 mg/dL — AB
Protein, ur: >300 mg/dL — AB
pH: 7.5 (ref 5.0–8.0)

## 2013-05-03 LAB — URINE MICROSCOPIC-ADD ON

## 2013-05-03 MED ORDER — CEPHALEXIN 500 MG PO CAPS: 500.0000 mg | ORAL_CAPSULE | Freq: Three times a day (TID) | ORAL | Status: DC

## 2013-05-03 MED ORDER — CEPHALEXIN 500 MG PO CAPS
500.0000 mg | ORAL_CAPSULE | Freq: Once | ORAL | Status: AC
  Administered 2013-05-03: 500 mg via ORAL
  Filled 2013-05-03: qty 1

## 2013-05-03 NOTE — ED Notes (Signed)
Pain rt flank , seen by Dr Ardelle Balls and sent for an MRI , Had blood in urine.
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
## 2013-05-03 NOTE — ED Notes (Signed)
Pt states Dr. Renard Matter sent him here for a MRI ( pt had ct ). States he has stones and he needs a pain shot. Pt states he makes urine but he cannot void right now. States he needs some ice first. Pt given a cup of ice chips

## 2013-05-04 ENCOUNTER — Emergency Department (HOSPITAL_COMMUNITY)
Admission: EM | Admit: 2013-05-04 | Discharge: 2013-05-04 | Disposition: A | Discharge status: Home / Self Care | Payer: Medicare Other | Attending: Emergency Medicine | Admitting: Emergency Medicine

## 2013-05-04 ENCOUNTER — Encounter (HOSPITAL_COMMUNITY): Payer: Self-pay

## 2013-05-04 DIAGNOSIS — N390 Urinary tract infection, site not specified: Secondary | ICD-10-CM | POA: Insufficient documentation

## 2013-05-04 DIAGNOSIS — Z8701 Personal history of pneumonia (recurrent): Secondary | ICD-10-CM | POA: Insufficient documentation

## 2013-05-04 DIAGNOSIS — J4489 Other specified chronic obstructive pulmonary disease: Secondary | ICD-10-CM | POA: Insufficient documentation

## 2013-05-04 DIAGNOSIS — Z8639 Personal history of other endocrine, nutritional and metabolic disease: Secondary | ICD-10-CM | POA: Insufficient documentation

## 2013-05-04 DIAGNOSIS — F209 Schizophrenia, unspecified: Secondary | ICD-10-CM | POA: Insufficient documentation

## 2013-05-04 DIAGNOSIS — N186 End stage renal disease: Secondary | ICD-10-CM | POA: Insufficient documentation

## 2013-05-04 DIAGNOSIS — R51 Headache: Secondary | ICD-10-CM | POA: Insufficient documentation

## 2013-05-04 DIAGNOSIS — F319 Bipolar disorder, unspecified: Secondary | ICD-10-CM | POA: Insufficient documentation

## 2013-05-04 DIAGNOSIS — I120 Hypertensive chronic kidney disease with stage 5 chronic kidney disease or end stage renal disease: Secondary | ICD-10-CM | POA: Insufficient documentation

## 2013-05-04 DIAGNOSIS — G43909 Migraine, unspecified, not intractable, without status migrainosus: Secondary | ICD-10-CM | POA: Insufficient documentation

## 2013-05-04 DIAGNOSIS — Z9889 Other specified postprocedural states: Secondary | ICD-10-CM | POA: Insufficient documentation

## 2013-05-04 DIAGNOSIS — D649 Anemia, unspecified: Secondary | ICD-10-CM | POA: Insufficient documentation

## 2013-05-04 DIAGNOSIS — Z7982 Long term (current) use of aspirin: Secondary | ICD-10-CM | POA: Insufficient documentation

## 2013-05-04 DIAGNOSIS — Z79899 Other long term (current) drug therapy: Secondary | ICD-10-CM | POA: Insufficient documentation

## 2013-05-04 DIAGNOSIS — F17200 Nicotine dependence, unspecified, uncomplicated: Secondary | ICD-10-CM | POA: Insufficient documentation

## 2013-05-04 DIAGNOSIS — Z8679 Personal history of other diseases of the circulatory system: Secondary | ICD-10-CM | POA: Insufficient documentation

## 2013-05-04 DIAGNOSIS — G894 Chronic pain syndrome: Secondary | ICD-10-CM | POA: Insufficient documentation

## 2013-05-04 DIAGNOSIS — Z9861 Coronary angioplasty status: Secondary | ICD-10-CM | POA: Insufficient documentation

## 2013-05-04 DIAGNOSIS — J449 Chronic obstructive pulmonary disease, unspecified: Secondary | ICD-10-CM | POA: Insufficient documentation

## 2013-05-04 DIAGNOSIS — Z862 Personal history of diseases of the blood and blood-forming organs and certain disorders involving the immune mechanism: Secondary | ICD-10-CM | POA: Insufficient documentation

## 2013-05-04 DIAGNOSIS — Z992 Dependence on renal dialysis: Secondary | ICD-10-CM | POA: Insufficient documentation

## 2013-05-04 NOTE — ED Notes (Signed)
1. Pt dx w/ uti yesterday and cont. To have pain, is on antibiotics, needs something for pain 2. Headache for 3 days and needs something for pain
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
## 2013-05-04 NOTE — ED Notes (Signed)
Patient with no complaints at this time. Respirations even and unlabored. Skin warm/dry. Discharge instructions reviewed with patient at this time. Patient given opportunity to voice concerns/ask questions. Patient discharged at this time and left Emergency Department with steady gait.   

## 2013-05-05 LAB — URINE CULTURE
Colony Count: NO GROWTH
Culture: NO GROWTH

## 2013-05-07 ENCOUNTER — Encounter (HOSPITAL_COMMUNITY): Payer: Self-pay | Admitting: *Deleted

## 2013-05-07 ENCOUNTER — Emergency Department (HOSPITAL_COMMUNITY)
Admission: EM | Admit: 2013-05-07 | Discharge: 2013-05-07 | Disposition: A | Discharge status: Home / Self Care | Payer: Medicare Other | Attending: Emergency Medicine | Admitting: Emergency Medicine

## 2013-05-07 DIAGNOSIS — R110 Nausea: Secondary | ICD-10-CM | POA: Insufficient documentation

## 2013-05-07 DIAGNOSIS — G8929 Other chronic pain: Secondary | ICD-10-CM | POA: Insufficient documentation

## 2013-05-07 DIAGNOSIS — Z862 Personal history of diseases of the blood and blood-forming organs and certain disorders involving the immune mechanism: Secondary | ICD-10-CM | POA: Insufficient documentation

## 2013-05-07 DIAGNOSIS — R5381 Other malaise: Secondary | ICD-10-CM | POA: Insufficient documentation

## 2013-05-07 DIAGNOSIS — Z7982 Long term (current) use of aspirin: Secondary | ICD-10-CM | POA: Insufficient documentation

## 2013-05-07 DIAGNOSIS — J4489 Other specified chronic obstructive pulmonary disease: Secondary | ICD-10-CM | POA: Insufficient documentation

## 2013-05-07 DIAGNOSIS — R5383 Other fatigue: Secondary | ICD-10-CM | POA: Insufficient documentation

## 2013-05-07 DIAGNOSIS — Z79899 Other long term (current) drug therapy: Secondary | ICD-10-CM | POA: Insufficient documentation

## 2013-05-07 DIAGNOSIS — H53149 Visual discomfort, unspecified: Secondary | ICD-10-CM | POA: Insufficient documentation

## 2013-05-07 DIAGNOSIS — G43909 Migraine, unspecified, not intractable, without status migrainosus: Secondary | ICD-10-CM | POA: Insufficient documentation

## 2013-05-07 DIAGNOSIS — D649 Anemia, unspecified: Secondary | ICD-10-CM | POA: Insufficient documentation

## 2013-05-07 DIAGNOSIS — F209 Schizophrenia, unspecified: Secondary | ICD-10-CM | POA: Insufficient documentation

## 2013-05-07 DIAGNOSIS — Z8679 Personal history of other diseases of the circulatory system: Secondary | ICD-10-CM | POA: Insufficient documentation

## 2013-05-07 DIAGNOSIS — Z8639 Personal history of other endocrine, nutritional and metabolic disease: Secondary | ICD-10-CM | POA: Insufficient documentation

## 2013-05-07 DIAGNOSIS — R109 Unspecified abdominal pain: Secondary | ICD-10-CM | POA: Insufficient documentation

## 2013-05-07 DIAGNOSIS — I509 Heart failure, unspecified: Secondary | ICD-10-CM | POA: Insufficient documentation

## 2013-05-07 DIAGNOSIS — F319 Bipolar disorder, unspecified: Secondary | ICD-10-CM | POA: Insufficient documentation

## 2013-05-07 DIAGNOSIS — J449 Chronic obstructive pulmonary disease, unspecified: Secondary | ICD-10-CM | POA: Insufficient documentation

## 2013-05-07 DIAGNOSIS — N186 End stage renal disease: Secondary | ICD-10-CM | POA: Insufficient documentation

## 2013-05-07 DIAGNOSIS — Z9861 Coronary angioplasty status: Secondary | ICD-10-CM | POA: Insufficient documentation

## 2013-05-07 DIAGNOSIS — F17200 Nicotine dependence, unspecified, uncomplicated: Secondary | ICD-10-CM | POA: Insufficient documentation

## 2013-05-07 DIAGNOSIS — I120 Hypertensive chronic kidney disease with stage 5 chronic kidney disease or end stage renal disease: Secondary | ICD-10-CM | POA: Insufficient documentation

## 2013-05-07 DIAGNOSIS — Z8701 Personal history of pneumonia (recurrent): Secondary | ICD-10-CM | POA: Insufficient documentation

## 2013-05-07 DIAGNOSIS — Z992 Dependence on renal dialysis: Secondary | ICD-10-CM | POA: Insufficient documentation

## 2013-05-07 MED ORDER — HYDROMORPHONE HCL PF 1 MG/ML IJ SOLN
1.0000 mg | Freq: Once | INTRAMUSCULAR | Status: AC
  Administered 2013-05-07: 1 mg via INTRAMUSCULAR
  Filled 2013-05-07: qty 1

## 2013-05-07 NOTE — ED Notes (Signed)
J. Idol, PA at bedside. 
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
## 2013-05-07 NOTE — ED Notes (Signed)
Headache, nausea

## 2013-05-09 NOTE — ED Provider Notes (Signed)
Medical screening examination/treatment/procedure(s) were performed by non-physician practitioner and as supervising physician I was immediately available for consultation/collaboration.   Dennice Tindol J. Zeus Marquis, MD 05/09/13 2319 

## 2013-05-13 ENCOUNTER — Emergency Department (HOSPITAL_COMMUNITY)
Admission: EM | Admit: 2013-05-13 | Discharge: 2013-05-13 | Disposition: A | Discharge status: Home / Self Care | Payer: Medicare Other | Attending: Emergency Medicine | Admitting: Emergency Medicine

## 2013-05-13 ENCOUNTER — Encounter (HOSPITAL_COMMUNITY): Payer: Self-pay

## 2013-05-13 DIAGNOSIS — N186 End stage renal disease: Secondary | ICD-10-CM | POA: Insufficient documentation

## 2013-05-13 DIAGNOSIS — G894 Chronic pain syndrome: Secondary | ICD-10-CM | POA: Insufficient documentation

## 2013-05-13 DIAGNOSIS — F209 Schizophrenia, unspecified: Secondary | ICD-10-CM | POA: Insufficient documentation

## 2013-05-13 DIAGNOSIS — G8929 Other chronic pain: Secondary | ICD-10-CM | POA: Insufficient documentation

## 2013-05-13 DIAGNOSIS — Z992 Dependence on renal dialysis: Secondary | ICD-10-CM | POA: Insufficient documentation

## 2013-05-13 DIAGNOSIS — Z9861 Coronary angioplasty status: Secondary | ICD-10-CM | POA: Insufficient documentation

## 2013-05-13 DIAGNOSIS — R110 Nausea: Secondary | ICD-10-CM | POA: Insufficient documentation

## 2013-05-13 DIAGNOSIS — F319 Bipolar disorder, unspecified: Secondary | ICD-10-CM | POA: Insufficient documentation

## 2013-05-13 DIAGNOSIS — D649 Anemia, unspecified: Secondary | ICD-10-CM | POA: Insufficient documentation

## 2013-05-13 DIAGNOSIS — Z79899 Other long term (current) drug therapy: Secondary | ICD-10-CM | POA: Insufficient documentation

## 2013-05-13 DIAGNOSIS — R109 Unspecified abdominal pain: Secondary | ICD-10-CM | POA: Insufficient documentation

## 2013-05-13 DIAGNOSIS — Z8679 Personal history of other diseases of the circulatory system: Secondary | ICD-10-CM | POA: Insufficient documentation

## 2013-05-13 DIAGNOSIS — Z862 Personal history of diseases of the blood and blood-forming organs and certain disorders involving the immune mechanism: Secondary | ICD-10-CM | POA: Insufficient documentation

## 2013-05-13 DIAGNOSIS — I120 Hypertensive chronic kidney disease with stage 5 chronic kidney disease or end stage renal disease: Secondary | ICD-10-CM | POA: Insufficient documentation

## 2013-05-13 DIAGNOSIS — H53149 Visual discomfort, unspecified: Secondary | ICD-10-CM | POA: Insufficient documentation

## 2013-05-13 DIAGNOSIS — G43909 Migraine, unspecified, not intractable, without status migrainosus: Secondary | ICD-10-CM | POA: Insufficient documentation

## 2013-05-13 DIAGNOSIS — J45909 Unspecified asthma, uncomplicated: Secondary | ICD-10-CM | POA: Insufficient documentation

## 2013-05-13 DIAGNOSIS — R51 Headache: Secondary | ICD-10-CM

## 2013-05-13 DIAGNOSIS — Z7982 Long term (current) use of aspirin: Secondary | ICD-10-CM | POA: Insufficient documentation

## 2013-05-13 DIAGNOSIS — I509 Heart failure, unspecified: Secondary | ICD-10-CM | POA: Insufficient documentation

## 2013-05-13 DIAGNOSIS — Z8639 Personal history of other endocrine, nutritional and metabolic disease: Secondary | ICD-10-CM | POA: Insufficient documentation

## 2013-05-13 DIAGNOSIS — Z8701 Personal history of pneumonia (recurrent): Secondary | ICD-10-CM | POA: Insufficient documentation

## 2013-05-13 DIAGNOSIS — F17200 Nicotine dependence, unspecified, uncomplicated: Secondary | ICD-10-CM | POA: Insufficient documentation

## 2013-05-13 MED ORDER — HYDROMORPHONE HCL PF 1 MG/ML IJ SOLN
1.0000 mg | Freq: Once | INTRAMUSCULAR | Status: AC
  Administered 2013-05-13: 1 mg via INTRAMUSCULAR
  Filled 2013-05-13: qty 1

## 2013-05-13 MED ORDER — ONDANSETRON 8 MG PO TBDP
8.0000 mg | ORAL_TABLET | Freq: Once | ORAL | Status: AC
  Administered 2013-05-13: 8 mg via ORAL
  Filled 2013-05-13: qty 1

## 2013-05-13 NOTE — ED Notes (Signed)
Pt reports migraine that began yesterday.  Pt reports sensitivity to light and some nausea.  Pt has tried otc meds w/out relief.

## 2013-05-13 NOTE — ED Notes (Signed)
Headache, since yesterday No Hx of HI,  Alert, talking

## 2013-05-16 ENCOUNTER — Encounter (HOSPITAL_COMMUNITY): Payer: Self-pay | Admitting: Emergency Medicine

## 2013-05-16 ENCOUNTER — Ambulatory Visit: Payer: Medicare Other | Admitting: Cardiology

## 2013-05-16 ENCOUNTER — Emergency Department (HOSPITAL_COMMUNITY)
Admission: EM | Admit: 2013-05-16 | Discharge: 2013-05-16 | Disposition: A | Discharge status: Home / Self Care | Payer: Medicare Other | Attending: Emergency Medicine | Admitting: Emergency Medicine

## 2013-05-16 DIAGNOSIS — J449 Chronic obstructive pulmonary disease, unspecified: Secondary | ICD-10-CM | POA: Insufficient documentation

## 2013-05-16 DIAGNOSIS — J4489 Other specified chronic obstructive pulmonary disease: Secondary | ICD-10-CM | POA: Insufficient documentation

## 2013-05-16 DIAGNOSIS — Z8701 Personal history of pneumonia (recurrent): Secondary | ICD-10-CM | POA: Insufficient documentation

## 2013-05-16 DIAGNOSIS — Z7982 Long term (current) use of aspirin: Secondary | ICD-10-CM | POA: Insufficient documentation

## 2013-05-16 DIAGNOSIS — M545 Low back pain, unspecified: Secondary | ICD-10-CM | POA: Insufficient documentation

## 2013-05-16 DIAGNOSIS — Z992 Dependence on renal dialysis: Secondary | ICD-10-CM | POA: Insufficient documentation

## 2013-05-16 DIAGNOSIS — F17200 Nicotine dependence, unspecified, uncomplicated: Secondary | ICD-10-CM | POA: Insufficient documentation

## 2013-05-16 DIAGNOSIS — G8929 Other chronic pain: Secondary | ICD-10-CM | POA: Insufficient documentation

## 2013-05-16 DIAGNOSIS — Z8679 Personal history of other diseases of the circulatory system: Secondary | ICD-10-CM | POA: Insufficient documentation

## 2013-05-16 DIAGNOSIS — N186 End stage renal disease: Secondary | ICD-10-CM | POA: Insufficient documentation

## 2013-05-16 DIAGNOSIS — Z862 Personal history of diseases of the blood and blood-forming organs and certain disorders involving the immune mechanism: Secondary | ICD-10-CM | POA: Insufficient documentation

## 2013-05-16 DIAGNOSIS — Z9861 Coronary angioplasty status: Secondary | ICD-10-CM | POA: Insufficient documentation

## 2013-05-16 DIAGNOSIS — Z8639 Personal history of other endocrine, nutritional and metabolic disease: Secondary | ICD-10-CM | POA: Insufficient documentation

## 2013-05-16 DIAGNOSIS — F209 Schizophrenia, unspecified: Secondary | ICD-10-CM | POA: Insufficient documentation

## 2013-05-16 DIAGNOSIS — G894 Chronic pain syndrome: Secondary | ICD-10-CM | POA: Insufficient documentation

## 2013-05-16 DIAGNOSIS — R109 Unspecified abdominal pain: Secondary | ICD-10-CM | POA: Insufficient documentation

## 2013-05-16 DIAGNOSIS — G43909 Migraine, unspecified, not intractable, without status migrainosus: Secondary | ICD-10-CM | POA: Insufficient documentation

## 2013-05-16 DIAGNOSIS — R51 Headache: Secondary | ICD-10-CM | POA: Insufficient documentation

## 2013-05-16 DIAGNOSIS — F319 Bipolar disorder, unspecified: Secondary | ICD-10-CM | POA: Insufficient documentation

## 2013-05-16 DIAGNOSIS — I120 Hypertensive chronic kidney disease with stage 5 chronic kidney disease or end stage renal disease: Secondary | ICD-10-CM | POA: Insufficient documentation

## 2013-05-16 DIAGNOSIS — Z79899 Other long term (current) drug therapy: Secondary | ICD-10-CM | POA: Insufficient documentation

## 2013-05-16 NOTE — ED Notes (Signed)
Denies urinary changes

## 2013-05-16 NOTE — ED Notes (Signed)
Pt c/o r side lower back pain and headache since this am. Denies injury. Denies dizziness. Nad.
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
## 2013-05-16 NOTE — ED Notes (Signed)
nad noted prior to dc. Dc instructions reviewed and explained. Ambulated out without difficulty.  

## 2013-05-17 ENCOUNTER — Encounter (HOSPITAL_COMMUNITY): Payer: Self-pay | Admitting: *Deleted

## 2013-05-17 ENCOUNTER — Emergency Department (HOSPITAL_COMMUNITY)
Admission: EM | Admit: 2013-05-17 | Discharge: 2013-05-17 | Disposition: A | Discharge status: Home / Self Care | Payer: Medicare Other | Attending: Emergency Medicine | Admitting: Emergency Medicine

## 2013-05-17 DIAGNOSIS — R51 Headache: Secondary | ICD-10-CM | POA: Insufficient documentation

## 2013-05-17 DIAGNOSIS — F17200 Nicotine dependence, unspecified, uncomplicated: Secondary | ICD-10-CM | POA: Insufficient documentation

## 2013-05-17 DIAGNOSIS — J4489 Other specified chronic obstructive pulmonary disease: Secondary | ICD-10-CM | POA: Insufficient documentation

## 2013-05-17 DIAGNOSIS — F319 Bipolar disorder, unspecified: Secondary | ICD-10-CM | POA: Insufficient documentation

## 2013-05-17 DIAGNOSIS — G8929 Other chronic pain: Secondary | ICD-10-CM

## 2013-05-17 DIAGNOSIS — G894 Chronic pain syndrome: Secondary | ICD-10-CM | POA: Insufficient documentation

## 2013-05-17 DIAGNOSIS — Z8679 Personal history of other diseases of the circulatory system: Secondary | ICD-10-CM | POA: Insufficient documentation

## 2013-05-17 DIAGNOSIS — D649 Anemia, unspecified: Secondary | ICD-10-CM | POA: Insufficient documentation

## 2013-05-17 DIAGNOSIS — I120 Hypertensive chronic kidney disease with stage 5 chronic kidney disease or end stage renal disease: Secondary | ICD-10-CM | POA: Insufficient documentation

## 2013-05-17 DIAGNOSIS — Z7982 Long term (current) use of aspirin: Secondary | ICD-10-CM | POA: Insufficient documentation

## 2013-05-17 DIAGNOSIS — Z8639 Personal history of other endocrine, nutritional and metabolic disease: Secondary | ICD-10-CM | POA: Insufficient documentation

## 2013-05-17 DIAGNOSIS — Z992 Dependence on renal dialysis: Secondary | ICD-10-CM | POA: Insufficient documentation

## 2013-05-17 DIAGNOSIS — Z9861 Coronary angioplasty status: Secondary | ICD-10-CM | POA: Insufficient documentation

## 2013-05-17 DIAGNOSIS — F209 Schizophrenia, unspecified: Secondary | ICD-10-CM | POA: Insufficient documentation

## 2013-05-17 DIAGNOSIS — Z862 Personal history of diseases of the blood and blood-forming organs and certain disorders involving the immune mechanism: Secondary | ICD-10-CM | POA: Insufficient documentation

## 2013-05-17 DIAGNOSIS — N186 End stage renal disease: Secondary | ICD-10-CM | POA: Insufficient documentation

## 2013-05-17 DIAGNOSIS — J449 Chronic obstructive pulmonary disease, unspecified: Secondary | ICD-10-CM | POA: Insufficient documentation

## 2013-05-17 DIAGNOSIS — G43909 Migraine, unspecified, not intractable, without status migrainosus: Secondary | ICD-10-CM | POA: Insufficient documentation

## 2013-05-17 DIAGNOSIS — Z79899 Other long term (current) drug therapy: Secondary | ICD-10-CM | POA: Insufficient documentation

## 2013-05-17 DIAGNOSIS — R109 Unspecified abdominal pain: Secondary | ICD-10-CM | POA: Insufficient documentation

## 2013-05-17 DIAGNOSIS — M545 Low back pain, unspecified: Secondary | ICD-10-CM | POA: Insufficient documentation

## 2013-05-17 DIAGNOSIS — Z8701 Personal history of pneumonia (recurrent): Secondary | ICD-10-CM | POA: Insufficient documentation

## 2013-05-17 MED ORDER — DIPHENHYDRAMINE HCL 50 MG/ML IJ SOLN
25.0000 mg | Freq: Once | INTRAMUSCULAR | Status: AC
  Administered 2013-05-17: 25 mg via INTRAVENOUS
  Filled 2013-05-17: qty 1

## 2013-05-17 MED ORDER — METOCLOPRAMIDE HCL 5 MG/ML IJ SOLN
10.0000 mg | Freq: Once | INTRAMUSCULAR | Status: AC
  Administered 2013-05-17: 10 mg via INTRAVENOUS
  Filled 2013-05-17: qty 2

## 2013-05-17 MED ORDER — DEXAMETHASONE SODIUM PHOSPHATE 4 MG/ML IJ SOLN
10.0000 mg | Freq: Once | INTRAMUSCULAR | Status: AC
  Administered 2013-05-17: 10 mg via INTRAVENOUS
  Filled 2013-05-17: qty 3
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
## 2013-05-17 NOTE — ED Notes (Signed)
Pt given ice chips as per EDP.

## 2013-05-17 NOTE — ED Notes (Signed)
Pt seen in the ED yesterday, pt states he is still hurting.

## 2013-05-17 NOTE — ED Notes (Signed)
Pt alert & oriented x4, stable gait. Patient  given discharge instructions, paperwork & prescription(s). Patient verbalized understanding. Pt left department w/ no further questions. 

## 2013-05-17 NOTE — ED Notes (Signed)
Pt seen in the er & returned stating he is still hurting.
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
## 2013-05-18 NOTE — ED Provider Notes (Signed)
Medical screening examination/treatment/procedure(s) were performed by non-physician practitioner and as supervising physician I was immediately available for consultation/collaboration.   Tomorrow Dehaas L Jayvon Mounger, MD 05/18/13 0711 

## 2013-05-29 ENCOUNTER — Ambulatory Visit: Payer: Medicare Other | Admitting: Cardiology

## 2013-06-01 ENCOUNTER — Encounter (HOSPITAL_COMMUNITY): Payer: Self-pay

## 2013-06-01 ENCOUNTER — Emergency Department (HOSPITAL_COMMUNITY)
Admission: EM | Admit: 2013-06-01 | Discharge: 2013-06-01 | Disposition: A | Discharge status: Home / Self Care | Payer: Medicare Other | Attending: Emergency Medicine | Admitting: Emergency Medicine

## 2013-06-01 DIAGNOSIS — N186 End stage renal disease: Secondary | ICD-10-CM | POA: Insufficient documentation

## 2013-06-01 DIAGNOSIS — Z79899 Other long term (current) drug therapy: Secondary | ICD-10-CM | POA: Insufficient documentation

## 2013-06-01 DIAGNOSIS — Z8679 Personal history of other diseases of the circulatory system: Secondary | ICD-10-CM | POA: Insufficient documentation

## 2013-06-01 DIAGNOSIS — Z87828 Personal history of other (healed) physical injury and trauma: Secondary | ICD-10-CM | POA: Insufficient documentation

## 2013-06-01 DIAGNOSIS — R1110 Vomiting, unspecified: Secondary | ICD-10-CM | POA: Insufficient documentation

## 2013-06-01 DIAGNOSIS — Z862 Personal history of diseases of the blood and blood-forming organs and certain disorders involving the immune mechanism: Secondary | ICD-10-CM | POA: Insufficient documentation

## 2013-06-01 DIAGNOSIS — F17200 Nicotine dependence, unspecified, uncomplicated: Secondary | ICD-10-CM | POA: Insufficient documentation

## 2013-06-01 DIAGNOSIS — J449 Chronic obstructive pulmonary disease, unspecified: Secondary | ICD-10-CM | POA: Insufficient documentation

## 2013-06-01 DIAGNOSIS — Z7982 Long term (current) use of aspirin: Secondary | ICD-10-CM | POA: Insufficient documentation

## 2013-06-01 DIAGNOSIS — Z9861 Coronary angioplasty status: Secondary | ICD-10-CM | POA: Insufficient documentation

## 2013-06-01 DIAGNOSIS — F319 Bipolar disorder, unspecified: Secondary | ICD-10-CM | POA: Insufficient documentation

## 2013-06-01 DIAGNOSIS — F209 Schizophrenia, unspecified: Secondary | ICD-10-CM | POA: Insufficient documentation

## 2013-06-01 DIAGNOSIS — J4489 Other specified chronic obstructive pulmonary disease: Secondary | ICD-10-CM | POA: Insufficient documentation

## 2013-06-01 DIAGNOSIS — I120 Hypertensive chronic kidney disease with stage 5 chronic kidney disease or end stage renal disease: Secondary | ICD-10-CM | POA: Insufficient documentation

## 2013-06-01 DIAGNOSIS — R51 Headache: Secondary | ICD-10-CM | POA: Insufficient documentation

## 2013-06-01 DIAGNOSIS — G8929 Other chronic pain: Secondary | ICD-10-CM | POA: Insufficient documentation

## 2013-06-01 DIAGNOSIS — Z992 Dependence on renal dialysis: Secondary | ICD-10-CM | POA: Insufficient documentation

## 2013-06-01 DIAGNOSIS — G43909 Migraine, unspecified, not intractable, without status migrainosus: Secondary | ICD-10-CM | POA: Insufficient documentation
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
## 2013-06-01 NOTE — ED Notes (Signed)
Pt c/o migraine headache since yesterday. Reports n/v yesterday, no vomiting today.  Reports had dialysis this morning.

## 2013-06-04 ENCOUNTER — Emergency Department (HOSPITAL_COMMUNITY)
Admission: EM | Admit: 2013-06-04 | Discharge: 2013-06-04 | Disposition: A | Discharge status: Home / Self Care | Payer: Medicare Other | Attending: Emergency Medicine | Admitting: Emergency Medicine

## 2013-06-04 ENCOUNTER — Telehealth: Payer: Self-pay | Admitting: *Deleted

## 2013-06-04 ENCOUNTER — Ambulatory Visit (INDEPENDENT_AMBULATORY_CARE_PROVIDER_SITE_OTHER): Payer: Medicare Other | Admitting: Cardiology

## 2013-06-04 ENCOUNTER — Encounter: Payer: Self-pay | Admitting: Cardiology

## 2013-06-04 VITALS — BP 152/87 | HR 68 | Ht 68.0 in | Wt 186.5 lb

## 2013-06-04 DIAGNOSIS — R51 Headache: Secondary | ICD-10-CM | POA: Insufficient documentation

## 2013-06-04 DIAGNOSIS — M25551 Pain in right hip: Secondary | ICD-10-CM

## 2013-06-04 DIAGNOSIS — I10 Essential (primary) hypertension: Secondary | ICD-10-CM | POA: Insufficient documentation

## 2013-06-04 DIAGNOSIS — F209 Schizophrenia, unspecified: Secondary | ICD-10-CM

## 2013-06-04 DIAGNOSIS — D649 Anemia, unspecified: Secondary | ICD-10-CM

## 2013-06-04 DIAGNOSIS — W1809XA Striking against other object with subsequent fall, initial encounter: Secondary | ICD-10-CM | POA: Insufficient documentation

## 2013-06-04 DIAGNOSIS — J387 Other diseases of larynx: Secondary | ICD-10-CM

## 2013-06-04 DIAGNOSIS — Y9289 Other specified places as the place of occurrence of the external cause: Secondary | ICD-10-CM | POA: Insufficient documentation

## 2013-06-04 DIAGNOSIS — J4489 Other specified chronic obstructive pulmonary disease: Secondary | ICD-10-CM | POA: Insufficient documentation

## 2013-06-04 DIAGNOSIS — Z7982 Long term (current) use of aspirin: Secondary | ICD-10-CM | POA: Insufficient documentation

## 2013-06-04 DIAGNOSIS — Z862 Personal history of diseases of the blood and blood-forming organs and certain disorders involving the immune mechanism: Secondary | ICD-10-CM | POA: Insufficient documentation

## 2013-06-04 DIAGNOSIS — Z8679 Personal history of other diseases of the circulatory system: Secondary | ICD-10-CM | POA: Insufficient documentation

## 2013-06-04 DIAGNOSIS — Z8639 Personal history of other endocrine, nutritional and metabolic disease: Secondary | ICD-10-CM | POA: Insufficient documentation

## 2013-06-04 DIAGNOSIS — S79919A Unspecified injury of unspecified hip, initial encounter: Secondary | ICD-10-CM | POA: Insufficient documentation

## 2013-06-04 DIAGNOSIS — Y9389 Activity, other specified: Secondary | ICD-10-CM | POA: Insufficient documentation

## 2013-06-04 DIAGNOSIS — I2589 Other forms of chronic ischemic heart disease: Secondary | ICD-10-CM

## 2013-06-04 DIAGNOSIS — N186 End stage renal disease: Secondary | ICD-10-CM

## 2013-06-04 DIAGNOSIS — I319 Disease of pericardium, unspecified: Secondary | ICD-10-CM

## 2013-06-04 DIAGNOSIS — Z87828 Personal history of other (healed) physical injury and trauma: Secondary | ICD-10-CM | POA: Insufficient documentation

## 2013-06-04 DIAGNOSIS — Z8701 Personal history of pneumonia (recurrent): Secondary | ICD-10-CM | POA: Insufficient documentation

## 2013-06-04 DIAGNOSIS — G894 Chronic pain syndrome: Secondary | ICD-10-CM | POA: Insufficient documentation

## 2013-06-04 DIAGNOSIS — I120 Hypertensive chronic kidney disease with stage 5 chronic kidney disease or end stage renal disease: Secondary | ICD-10-CM | POA: Insufficient documentation

## 2013-06-04 DIAGNOSIS — F319 Bipolar disorder, unspecified: Secondary | ICD-10-CM | POA: Insufficient documentation

## 2013-06-04 DIAGNOSIS — J449 Chronic obstructive pulmonary disease, unspecified: Secondary | ICD-10-CM | POA: Insufficient documentation

## 2013-06-04 DIAGNOSIS — Z79899 Other long term (current) drug therapy: Secondary | ICD-10-CM | POA: Insufficient documentation

## 2013-06-04 DIAGNOSIS — F17200 Nicotine dependence, unspecified, uncomplicated: Secondary | ICD-10-CM | POA: Insufficient documentation

## 2013-06-04 DIAGNOSIS — I255 Ischemic cardiomyopathy: Secondary | ICD-10-CM

## 2013-06-04 DIAGNOSIS — G43909 Migraine, unspecified, not intractable, without status migrainosus: Secondary | ICD-10-CM | POA: Insufficient documentation

## 2013-06-04 DIAGNOSIS — Z992 Dependence on renal dialysis: Secondary | ICD-10-CM | POA: Insufficient documentation

## 2013-06-04 DIAGNOSIS — Z8719 Personal history of other diseases of the digestive system: Secondary | ICD-10-CM | POA: Insufficient documentation

## 2013-06-04 MED ORDER — CARVEDILOL 25 MG PO TABS: 25.0000 mg | ORAL_TABLET | Freq: Two times a day (BID) | ORAL | Status: DC

## 2013-06-04 NOTE — Assessment & Plan Note (Signed)
Systolic BP has been very mildly elevated of late and DBP generally well controlled.  Dose of carvedilol will be increased for enhanced therapy of cardiomyopathy and to further lower blood pressure.
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
## 2013-06-04 NOTE — Patient Instructions (Addendum)
Your physician recommends that you schedule a follow-up appointment in: 2 WEEKS WITH DR RR  Your physician has recommended you make the following change in your medication:   1) INCREASE COREG TO 25MG  TWICE DAILY  Your physician has requested that you have an echocardiogram. Echocardiography is a painless test that uses sound waves to create images of your heart. It provides your doctor with information about the size and shape of your heart and how well your heart's chambers and valves are working. This procedure takes approximately one hour. There are no restrictions for this procedure.  Your physician has requested that you regularly monitor and record your blood pressure readings at home. Please use the same machine at the same time of day to check your readings and record them to bring to your follow-up visit.

## 2013-06-04 NOTE — ED Notes (Signed)
Larey Seat off back of pickup truck while unloading watermelons 2pm. Also now has migraine h/a. Out of pain medications.

## 2013-06-04 NOTE — Telephone Encounter (Signed)
Received call from nathan at [facility] pharmacy to clarify the pt has been changed to carvedilol 25mg  BID per noted that pt PCP Dondiego has placed the pt on a large dosage of labetalol 200mg , sig: TAKE 4 PILLS (800MG ) BID, pt picked up RX on 05-28-13, please advise if pharmacy can fill this new increase based on medication from PCP as advised

## 2013-06-04 NOTE — Assessment & Plan Note (Signed)
Moderately severe anemia persists, likely related to chronic kidney disease. Dr. Kristian Covey is managing.

## 2013-06-04 NOTE — Progress Notes (Deleted)
Name: [name]    DOB: 1976/08/04  Age: 37 y.o.  MR#: [id number]       PCP:  Isabella Stalling, MD      Insurance: Payor: MEDICARE / Plan: MEDICARE PART A AND B / Product Type: *No Product type* /   CC:   No chief complaint on file.  list VS Filed Vitals:   06/04/13 1348  BP: 152/87  Pulse: 68  Height: 5\' 8"  (1.727 m)  Weight: 186 lb 8 oz (84.596 kg)    Weights Current Weight  06/04/13 186 lb 8 oz (84.596 kg)  06/04/13 185 lb (83.915 kg)  06/01/13 185 lb (83.915 kg)    Blood Pressure  BP Readings from Last 3 Encounters:  06/04/13 152/87  06/04/13 152/87  06/01/13 165/94     Admit date:  (Not on file) Last encounter with RMR:  05/29/2013   Allergy Methadone; Simvastatin; Fentanyl; Ibuprofen; Ketorolac tromethamine; Naproxen; and Tramadol hcl  Current Outpatient Prescriptions  Medication Sig Dispense Refill  . aspirin 325 MG tablet Take 1 tablet (325 mg total) by mouth daily.      Marland Kitchen b complex-vitamin c-folic acid (NEPHRO-VITE) 0.8 MG TABS Take 0.8 mg by mouth at bedtime.       . carvedilol (COREG) 12.5 MG tablet Take 12.5 mg by mouth 2 (two) times daily with a meal.      . cloNIDine (CATAPRES) 0.2 MG tablet Take 0.2 mg by mouth 2 (two) times daily.      . darbepoetin (ARANESP) 100 MCG/0.5ML SOLN Inject 0.5 mLs (100 mcg total) into the vein every Saturday with hemodialysis.  4.2 mL    . diphenhydrAMINE (BENADRYL) 25 MG tablet Take 25 mg by mouth every 6 (six) hours as needed for allergies.      . DULoxetine (CYMBALTA) 60 MG capsule Take 60 mg by mouth daily.        . hydrALAZINE (APRESOLINE) 50 MG tablet Take 50 mg by mouth 3 (three) times daily.       . isosorbide mononitrate (IMDUR) 30 MG 24 hr tablet Take 1 tablet (30 mg total) by mouth daily.  30 tablet  3  . lansoprazole (PREVACID) 30 MG capsule Take 1 capsule (30 mg total) by mouth daily.  30 capsule  3  . lisinopril (PRINIVIL,ZESTRIL) 20 MG tablet Take 20 mg by mouth 2 (two) times daily.       Marland Kitchen OLANZapine  (ZYPREXA) 20 MG tablet Take 10 mg by mouth 2 (two) times daily.      . pravastatin (PRAVACHOL) 40 MG tablet Take 40 mg by mouth daily.       Marland Kitchen rOPINIRole (REQUIP) 1 MG tablet Take 1 mg by mouth at bedtime.       . sevelamer carbonate (RENVELA) 800 MG tablet Take 2 tablets (1,600 mg total) by mouth 3 (three) times daily with meals.  180 tablet  0  . zolpidem (AMBIEN) 10 MG tablet Take 10 mg by mouth at bedtime.       No current facility-administered medications for this visit.    Discontinued Meds:   There are no discontinued medications.  Patient Active Problem List   Diagnosis Date Noted  . Chronic abdominal pain 07/11/2012  . Vomiting 07/11/2012  . GERD (gastroesophageal reflux disease) 07/11/2012  . Ischemic cardiomyopathy   . Bipolar 1 disorder   . Schizophrenia   . Chronic obstructive pulmonary disease   . Anemia   . Chronic chest pain 08/17/2011  . Tobacco abuse 08/17/2011  .  ESRD (end stage renal disease) 07/17/2011  . HYPERTENSION 05/21/2009    LABS    Component Value Date/Time   NA 133* 04/22/2013 1435   NA 138 04/21/2013 0243   NA 136 04/20/2013 1330   K 5.1 04/22/2013 1435   K 3.9 04/21/2013 0243   K 4.7 04/20/2013 1330   CL 92* 04/22/2013 1435   CL 90* 04/21/2013 0243   CL 92* 04/20/2013 1330   CO2 22 04/22/2013 1435   CO2 26 04/21/2013 0243   CO2 24 04/20/2013 1330   GLUCOSE 104* 04/22/2013 1435   GLUCOSE 104* 04/21/2013 0243   GLUCOSE 118* 04/20/2013 1330   BUN 70* 04/22/2013 1435   BUN 33* 04/21/2013 0243   BUN 42* 04/20/2013 1330   CREATININE 5.03* 04/22/2013 1435   CREATININE 3.18* 04/21/2013 0243   CREATININE 4.56* 04/20/2013 1330   CALCIUM 9.6 04/22/2013 1435   CALCIUM 10.2 04/22/2013 1435   CALCIUM 10.0 04/21/2013 0243   CALCIUM 10.1 04/20/2013 1330   GFRNONAA 13* 04/22/2013 1435   GFRNONAA 23* 04/21/2013 0243   GFRNONAA 15* 04/20/2013 1330   GFRAA 15* 04/22/2013 1435   GFRAA 27* 04/21/2013 0243   GFRAA 17* 04/20/2013 1330   CMP     Component Value Date/Time   NA 133* 04/22/2013 1435   K 5.1  04/22/2013 1435   CL 92* 04/22/2013 1435   CO2 22 04/22/2013 1435   GLUCOSE 104* 04/22/2013 1435   BUN 70* 04/22/2013 1435   CREATININE 5.03* 04/22/2013 1435   CALCIUM 9.6 04/22/2013 1435   CALCIUM 10.2 04/22/2013 1435   PROT 6.3 03/23/2013 0625   ALBUMIN 3.1* 04/22/2013 1435   AST 28 03/23/2013 0625   ALT 21 03/23/2013 0625   ALKPHOS 228* 03/23/2013 0625   BILITOT 0.7 03/23/2013 0625   GFRNONAA 13* 04/22/2013 1435   GFRAA 15* 04/22/2013 1435       Component Value Date/Time   WBC 11.5* 04/22/2013 1435   WBC 12.3* 04/22/2013 0440   WBC 8.8 04/21/2013 0243   HGB 8.5* 04/22/2013 1435   HGB 8.4* 04/22/2013 0440   HGB 8.1* 04/21/2013 0243   HCT 24.7* 04/22/2013 1435   HCT 24.8* 04/22/2013 0440   HCT 24.1* 04/21/2013 0243   MCV 89.8 04/22/2013 1435   MCV 90.5 04/22/2013 0440   MCV 91.6 04/21/2013 0243    Lipid Panel     Component Value Date/Time   CHOL  Value: 160        ATP III CLASSIFICATION:  <200     mg/dL   Desirable  284-132  mg/dL   Borderline High  >=440    mg/dL   High        10/15/2535 0538   TRIG 77 10/07/2010 0538   HDL 82 10/07/2010 0538   CHOLHDL 2.0 10/07/2010 0538   VLDL 15 10/07/2010 0538   LDLCALC  Value: 63        Total Cholesterol/HDL:CHD Risk Coronary Heart Disease Risk Table                     Men   Women  1/2 Average Risk   3.4   3.3  Average Risk       5.0   4.4  2 X Average Risk   9.6   7.1  3 X Average Risk  23.4   11.0        Use the calculated Patient Ratio above and the CHD Risk Table to determine the patient's CHD  Risk.        ATP III CLASSIFICATION (LDL):  <100     mg/dL   Optimal  454-098  mg/dL   Near or Above                    Optimal  130-159  mg/dL   Borderline  119-147  mg/dL   High  >829     mg/dL   Very High 56/21/3086 0538    ABG    Component Value Date/Time   PHART 7.364 04/19/2013 1845   PCO2ART 39.4 04/19/2013 1845   PO2ART 81.1 04/19/2013 1845   HCO3 21.9 04/19/2013 1845   TCO2 20.7 04/19/2013 1845   ACIDBASEDEF 2.6* 04/19/2013 1845   O2SAT 94.6 04/19/2013 1845     Lab Results  Component  Value Date   TSH 4.559* 02/19/2013   BNP (last 3 results)  Recent Labs  02/18/13 1402 03/21/13 1843  PROBNP >70000.0* >70000.0*   Cardiac Panel (last 3 results) No results found for this basename: CKTOTAL, CKMB, TROPONINI, RELINDX,  in the last 72 hours  Iron/TIBC/Ferritin    Component Value Date/Time   IRON 151* 04/22/2013 1435   TIBC 200* 04/22/2013 1435   FERRITIN 3170* 04/22/2013 1435     EKG Orders placed during the hospital encounter of 04/19/13  . EKG 12-LEAD  . EKG 12-LEAD  . EKG 12-LEAD  . EKG 12-LEAD  . ED EKG  . ED EKG  . EKG 12-LEAD  . EKG 12-LEAD  . EKG     Prior Assessment and Plan Problem List as of 06/04/2013   HYPERTENSION   Last Assessment & Plan   11/17/2011 Office Visit Edited 11/20/2011 12:18 PM by Kathlen Brunswick, MD     Hypertension is not well-controlled today, but patient did not take his medications This morning.  Management of this problem would best be undertaken in conjunction with his dialysis visits.    ESRD (end stage renal disease)   Last Assessment & Plan   11/17/2011 Office Visit Written 11/18/2011 10:00 AM by Kathlen Brunswick, MD     Patient is above his dry weight and is receiving extra dialysis to bring his fluid status back to normal.    Chronic chest pain   Last Assessment & Plan   11/17/2011 Office Visit Written 11/18/2011  9:59 AM by Kathlen Brunswick, MD     Symptoms have resolved for the time being.  No additional testing is recommended.    Tobacco abuse   Last Assessment & Plan   08/17/2011 Office Visit Written 08/17/2011 12:34 PM by Dyann Kief, PA     Patient continues to smoke 7 cigarettes daily. He has cut down from 3 packs per day. We discussed the importance of smoking cessation.    Ischemic cardiomyopathy   Last Assessment & Plan   11/17/2011 Office Visit Edited 11/19/2011 11:24 PM by Kathlen Brunswick, MD     Management of fluid status in this gentleman with end-stage renal disease is dependent upon adequate  dialysis.  Current medical therapy is optimized.  Patient has taken clopidogrel chronically, which is likely not providing much benefit at present.  This medication will be discontinued and his dose of aspirin decreased to 81 mg per day.    Bipolar 1 disorder   Schizophrenia   Chronic obstructive pulmonary disease   Last Assessment & Plan   11/17/2011 Office Visit Written 11/18/2011 10:00 AM by Kathlen Brunswick, MD  Symptoms are fairly well controlled at present.  Tobacco cessation is highly desirable, but patient indicates no willingness to seriously attempt to quit.    Anemia   Last Assessment & Plan   07/11/2012 Office Visit Written 07/11/2012  8:49 AM by Tiffany Kocher, PA     Chronic anemia likely anemia of chronic disease based on prior anemia panel 2 years ago. Hemoglobin has been stable.    Chronic abdominal pain   Last Assessment & Plan   07/11/2012 Office Visit Edited 07/11/2012  8:53 AM by Tiffany Kocher, PA     Chronic intermittent upper abdominal pain not related to meals or bowel movements. Intermittent nausea and vomiting. Typical reflux symptoms controlled with Dexilant. Consider possibility of ulcer disease. Cannot exclude microlithiasis or pancreatic abnormality given intermittent elevated lipase in the setting of abdominal pain. Difficult to interpret given his end-stage renal disease and noncontrast CTs. As discussed with patient today, offered him an upper endoscopy.  I have discussed the risks, alternatives, benefits with regards to but not limited to the risk of reaction to medication, bleeding, infection, perforation and the patient is agreeable to proceed. Written consent to be obtained. EGD to be done with deep sedation in the OR given his polypharmacy and need for MAC in the past.  If EGD is negative, he may benefit from endoscopic ultrasound. He may also need gastric emptying study but gastroparesis would not explain his abdominal pain.  Zofran provided for nausea  vomiting. Further recommendations to follow.    Vomiting   GERD (gastroesophageal reflux disease)       Imaging: No results found.

## 2013-06-04 NOTE — Assessment & Plan Note (Addendum)
EF has deteriorated somewhat since 2010 when a catheterization revealed a value of 40-45% and a recent echocardiogram reporting an EF of 30-35%. We will continue to attempt to optimize treatment, initially by increasing carvedilol to a dose of 25 mg twice a day.  Echocardiogram will be repeated to assess LV function and to reassess apparent intracardiac masses in the LA and LV.

## 2013-06-04 NOTE — ED Notes (Signed)
Discharge instructions reviewed with pt, questions answered. Pt verbalized understanding.  

## 2013-06-04 NOTE — Assessment & Plan Note (Signed)
Patient has a very prominent pericardial rub in the absence of symptoms. An echocardiogram is pending to assess the presence or absence of pericardial fluid. There is no evidence on physical exam for tamponade nor any evidence for hemodynamic compromise. Patient may require more intensive dialysis or treatment with anti-inflammatory medication. This will be discussed with Dr. Kristian Covey once echocardiographic results are available and the patient has been reassessed in 2 weeks.

## 2013-06-04 NOTE — Progress Notes (Signed)
Patient ID: [name], male   DOB: [birth year], 37 y.o.   MRN: [id number]  HPI: Schedule return visit for continued assessment and treatment of possible intracardiac mass, cardiomyopathy, and hypertension in the setting of end-stage renal disease. Recent admission for congestive heart failure related to inadequate dialysis. Nodular density noted in the left ventricle and a second density in the left atrium, but no specific diagnosis established. Patient has done well subsequently with adequate blood pressure control, no dyspnea and no chest discomfort.  He brings a list of medications to this visit, but I'm not certain that he is actually taking everything on his extensive list.  He reports that both his mother and sister have died within the past year and that this has been difficult for him.  Current Outpatient Prescriptions  Medication Sig Dispense Refill  . aspirin 325 MG tablet Take 1 tablet (325 mg total) by mouth daily.      Marland Kitchen b complex-vitamin c-folic acid (NEPHRO-VITE) 0.8 MG TABS Take 0.8 mg by mouth at bedtime.       . cloNIDine (CATAPRES) 0.2 MG tablet Take 0.2 mg by mouth 2 (two) times daily.      . darbepoetin (ARANESP) 100 MCG/0.5ML SOLN Inject 0.5 mLs (100 mcg total) into the vein every Saturday with hemodialysis.  4.2 mL    . diphenhydrAMINE (BENADRYL) 25 MG tablet Take 25 mg by mouth every 6 (six) hours as needed for allergies.      . DULoxetine (CYMBALTA) 60 MG capsule Take 60 mg by mouth daily.        . hydrALAZINE (APRESOLINE) 50 MG tablet Take 50 mg by mouth 3 (three) times daily.       . isosorbide mononitrate (IMDUR) 30 MG 24 hr tablet Take 1 tablet (30 mg total) by mouth daily.  30 tablet  3  . lansoprazole (PREVACID) 30 MG capsule Take 1 capsule (30 mg total) by mouth daily.  30 capsule  3  . lisinopril (PRINIVIL,ZESTRIL) 20 MG tablet Take 20 mg by mouth 2 (two) times daily.       Marland Kitchen OLANZapine (ZYPREXA) 20 MG tablet Take 10 mg by mouth 2 (two) times daily.      .  pravastatin (PRAVACHOL) 40 MG tablet Take 40 mg by mouth daily.       Marland Kitchen rOPINIRole (REQUIP) 1 MG tablet Take 1 mg by mouth at bedtime.       . sevelamer carbonate (RENVELA) 800 MG tablet Take 2 tablets (1,600 mg total) by mouth 3 (three) times daily with meals.  180 tablet  0  . zolpidem (AMBIEN) 10 MG tablet Take 10 mg by mouth at bedtime.       No current facility-administered medications for this visit.   Allergies  Allergen Reactions  . Methadone Anaphylaxis  . Simvastatin Hives and Swelling  . Fentanyl Rash  . Ibuprofen Swelling and Rash  . Ketorolac Tromethamine Other (See Comments)    unknown  . Naproxen Rash  . Tramadol Hcl Rash     Past medical history, social history, and family history reviewed and updated.  ROS: Denies chest discomfort, dyspnea, orthopnea, PND, increased edema, lightheadedness, palpitations or syncope. He only takes his medications prior to dialysis, which results in mild hypertension before her treatment. All other systems reviewed and are negative.  PHYSICAL EXAM: BP 152/87  Pulse 68  Ht 5\' 8"  (1.727 m)  Wt 84.596 kg (186 lb 8 oz)  BMI 28.36 kg/m2;  Body mass index is 28.36  kg/(m^2). General-Well developed; no acute distress; gray complexion Body habitus-mildly overweight Neck-No JVD; no carotid bruits Lungs-clear lung fields; resonant to percussion Cardiovascular-normal PMI; normal S1 and S2, 3 component friction rub Abdomen-normal bowel sounds; soft and non-tender without masses or organomegaly Musculoskeletal-No deformities, no cyanosis or clubbing Neurologic-Normal cranial nerves; symmetric strength and tone Skin-Warm, no significant lesions; small ulcerated abrasions present over the upper extremities-patient reports trauma when working to clear brush Extremities-distal pulses intact; marked increase in ankle diameter, but only 1+ pitting edema is present  EKG: Normal sinus rhythm; borderline first-degree AV block; ST-T wave abnormalities  consistent with LVH or apical ischemia; IVCD; no previous tracing for comparison.  Overland Bing, MD 06/04/2013  2:40 PM  ASSESSMENT AND PLAN

## 2013-06-05 ENCOUNTER — Encounter (HOSPITAL_COMMUNITY): Payer: Self-pay

## 2013-06-05 NOTE — Telephone Encounter (Signed)
We need an accurate, up-to-date list of medications that the patient is taking.

## 2013-06-07 ENCOUNTER — Ambulatory Visit: Payer: Medicare Other | Admitting: Cardiology

## 2013-06-07 NOTE — Telephone Encounter (Signed)
.  left message to have patient return my call.  

## 2013-06-10 ENCOUNTER — Ambulatory Visit (HOSPITAL_COMMUNITY): Payer: Medicare Other

## 2013-06-10 NOTE — Telephone Encounter (Signed)
.  left message to have patient return my call.  

## 2013-06-11 NOTE — Telephone Encounter (Signed)
Spoke to [place] with [facility] to clarify the pt medications picked up as follows:  COREG 12.5MG  BID 05-02-13 (PT HAS NOT PICKED UP THE NEW DOSAGE OF COREG 25MG  BID PRESCRIBED ON 06-04-13) CLONIDINE 0.2MB BID 06-10-13 HYDRALAZINE 50MG  TID 05-02-13 IMDUR 30MG  QD 05-28-13 LISINOPRIL 20MG  BID 05-18-13 PRAVASTATIN 40MG  QD 06-05-13  Unable to contact pt to verify what medications he has, several messages left, please advise

## 2013-06-13 ENCOUNTER — Other Ambulatory Visit (HOSPITAL_COMMUNITY): Payer: Medicare Other

## 2013-06-13 NOTE — Telephone Encounter (Signed)
Noted  

## 2013-06-18 ENCOUNTER — Ambulatory Visit (HOSPITAL_COMMUNITY)
Admission: RE | Admit: 2013-06-18 | Discharge: 2013-06-18 | Disposition: A | Discharge status: Home / Self Care | Payer: Medicare Other | Source: Ambulatory Visit | Attending: Cardiology | Admitting: Cardiology

## 2013-06-18 DIAGNOSIS — J387 Other diseases of larynx: Secondary | ICD-10-CM

## 2013-06-18 DIAGNOSIS — I10 Essential (primary) hypertension: Secondary | ICD-10-CM | POA: Insufficient documentation

## 2013-06-18 DIAGNOSIS — I2589 Other forms of chronic ischemic heart disease: Secondary | ICD-10-CM | POA: Insufficient documentation

## 2013-06-18 DIAGNOSIS — J449 Chronic obstructive pulmonary disease, unspecified: Secondary | ICD-10-CM | POA: Insufficient documentation

## 2013-06-18 DIAGNOSIS — F17200 Nicotine dependence, unspecified, uncomplicated: Secondary | ICD-10-CM | POA: Insufficient documentation

## 2013-06-18 DIAGNOSIS — J4489 Other specified chronic obstructive pulmonary disease: Secondary | ICD-10-CM | POA: Insufficient documentation

## 2013-06-18 DIAGNOSIS — I369 Nonrheumatic tricuspid valve disorder, unspecified: Secondary | ICD-10-CM

## 2013-06-18 NOTE — Progress Notes (Signed)
*  PRELIMINARY RESULTS* Echocardiogram 2D Echocardiogram has been performed.  [name] 06/18/2013, 1:21 PM

## 2013-06-19 ENCOUNTER — Ambulatory Visit: Payer: Medicare Other | Admitting: Adult Health

## 2013-06-21 ENCOUNTER — Encounter: Payer: Self-pay | Admitting: Cardiology

## 2013-06-21 DIAGNOSIS — I058 Other rheumatic mitral valve diseases: Secondary | ICD-10-CM | POA: Insufficient documentation

## 2013-06-22 NOTE — Telephone Encounter (Signed)
Ms. [name] can address at upcoming office visit on 7/8.

## 2013-06-25 ENCOUNTER — Encounter: Payer: Self-pay | Admitting: *Deleted

## 2013-06-25 ENCOUNTER — Telehealth: Payer: Self-pay | Admitting: Adult Health

## 2013-06-25 ENCOUNTER — Encounter: Payer: Medicare Other | Admitting: Adult Health

## 2013-06-25 NOTE — Telephone Encounter (Signed)
No Show  / left message for patient to call office to reschedule / letter also mailed / tgs

## 2013-06-25 NOTE — Telephone Encounter (Signed)
KL will address with pt at [facility] today

## 2013-06-25 NOTE — Telephone Encounter (Signed)
FYI: Pt no showed for his apt today with KL NP, left message to advise pt to call office back to re-schedule, also mailed a certified letter to advise to call office to schedule apt per need to discuss medication regimen

## 2013-06-25 NOTE — Progress Notes (Signed)
No show

## 2013-06-25 NOTE — Telephone Encounter (Signed)
FYI

## 2013-06-27 NOTE — Telephone Encounter (Signed)
Noted receipt of certified letter [phone]

## 2013-07-19 ENCOUNTER — Encounter: Payer: Self-pay | Admitting: Cardiology

## 2013-07-22 ENCOUNTER — Ambulatory Visit: Payer: Medicare Other | Admitting: Adult Health

## 2013-07-30 ENCOUNTER — Encounter: Payer: Self-pay | Admitting: Adult Health

## 2013-07-30 ENCOUNTER — Ambulatory Visit (INDEPENDENT_AMBULATORY_CARE_PROVIDER_SITE_OTHER): Payer: Medicare Other | Admitting: Adult Health

## 2013-07-30 VITALS — BP 172/92 | HR 70 | Ht 68.0 in | Wt 153.0 lb

## 2013-07-30 DIAGNOSIS — I10 Essential (primary) hypertension: Secondary | ICD-10-CM

## 2013-07-30 DIAGNOSIS — I058 Other rheumatic mitral valve diseases: Secondary | ICD-10-CM

## 2013-07-30 DIAGNOSIS — I059 Rheumatic mitral valve disease, unspecified: Secondary | ICD-10-CM

## 2013-07-30 DIAGNOSIS — Z720 Tobacco use: Secondary | ICD-10-CM

## 2013-07-30 DIAGNOSIS — F17200 Nicotine dependence, unspecified, uncomplicated: Secondary | ICD-10-CM
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
## 2013-07-30 NOTE — Assessment & Plan Note (Signed)
Pressure is elevated on this visit. The patient holds his clonidine during days of dialysis, but will be taking it this afternoon on return home. He will continue on current medication regimen without changes in doses. We will see him again in 6 months unless symptomatic.

## 2013-07-30 NOTE — Assessment & Plan Note (Signed)
Unfortunately continues to smoke. He is not inclined to quit at this time.

## 2013-07-30 NOTE — Patient Instructions (Signed)
Your physician recommends that you schedule a follow-up appointment in: 4 months You will receive a reminder letter two months in advance reminding you to call and schedule your appointment. If you don't receive this letter, please contact our office.  Your physician recommends that you continue on your current medications as directed. Please refer to the Current Medication list given to you today.   

## 2013-07-30 NOTE — Assessment & Plan Note (Signed)
I have reviewed echo report with Dr. Beulah Gandy on site. 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 As long as it is not impeding mitral valve structure or function, we will make no further recommendations. Cardiac MRI or TEE will not change medical regimen. This has been explained to the patient who verbalizes understanding. He is asymptomatic, continue to monitor.

## 2013-07-30 NOTE — Progress Notes (Deleted)
Name: [name]    DOB: 1976-10-31  Age: 37 y.o.  MR#: [id number]       PCP:  Isabella Stalling, MD      Insurance: Payor: MEDICARE / Plan: MEDICARE PART A AND B / Product Type: *No Product type* /   CC:    Chief Complaint  Patient presents with  . Hypertension  . Cardiomyopathy    VS Filed Vitals:   07/30/13 1532  BP: 172/92  Pulse: 70  Height: 5\' 8"  (1.727 m)  Weight: 153 lb (69.4 kg)  SpO2: 96%    Weights Current Weight  07/30/13 153 lb (69.4 kg)  06/04/13 186 lb 8 oz (84.596 kg)  06/04/13 185 lb (83.915 kg)    Blood Pressure  BP Readings from Last 3 Encounters:  07/30/13 172/92  06/04/13 152/87  06/04/13 152/87     Admit date:  (Not on file) Last encounter with RMR:  07/22/2013   Allergy Methadone; Simvastatin; Fentanyl; Ibuprofen; Ketorolac tromethamine; Naproxen; and Tramadol hcl  Current Outpatient Prescriptions  Medication Sig Dispense Refill  . amLODipine (NORVASC) 10 MG tablet Take 10 mg by mouth daily.      Marland Kitchen aspirin 325 MG tablet Take 1 tablet (325 mg total) by mouth daily.      Marland Kitchen b complex-vitamin c-folic acid (NEPHRO-VITE) 0.8 MG TABS Take 0.8 mg by mouth at bedtime.       . carvedilol (COREG) 25 MG tablet Take 12.5 mg by mouth 2 (two) times daily.      . cloNIDine (CATAPRES) 0.2 MG tablet Take 0.2 mg by mouth 2 (two) times daily.      . darbepoetin (ARANESP) 100 MCG/0.5ML SOLN Inject 0.5 mLs (100 mcg total) into the vein every Saturday with hemodialysis.  4.2 mL    . diphenhydrAMINE (BENADRYL) 25 MG tablet Take 25 mg by mouth every 6 (six) hours as needed for allergies.      . DULoxetine (CYMBALTA) 60 MG capsule Take 60 mg by mouth daily.        . hydrALAZINE (APRESOLINE) 50 MG tablet Take 50 mg by mouth 3 (three) times daily.       . isosorbide mononitrate (IMDUR) 30 MG 24 hr tablet Take 1 tablet (30 mg total) by mouth daily.  30 tablet  3  . labetalol (NORMODYNE) 200 MG tablet Take 800 mg by mouth 2 (two) times daily.      . lansoprazole  (PREVACID) 30 MG capsule Take 1 capsule (30 mg total) by mouth daily.  30 capsule  3  . lisinopril (PRINIVIL,ZESTRIL) 20 MG tablet Take 20 mg by mouth 2 (two) times daily.       Marland Kitchen OLANZapine (ZYPREXA) 20 MG tablet Take 10 mg by mouth 2 (two) times daily.      Marland Kitchen oxyCODONE-acetaminophen (PERCOCET/ROXICET) 5-325 MG per tablet Take 1 tablet by mouth 4 (four) times daily as needed for pain.      . pravastatin (PRAVACHOL) 40 MG tablet Take 40 mg by mouth daily.       Marland Kitchen rOPINIRole (REQUIP) 1 MG tablet Take 1 mg by mouth at bedtime.       . sevelamer carbonate (RENVELA) 800 MG tablet Take 2 tablets (1,600 mg total) by mouth 3 (three) times daily with meals.  180 tablet  0  . zolpidem (AMBIEN) 10 MG tablet Take 10 mg by mouth at bedtime.       No current facility-administered medications for this visit.    Discontinued Meds:  Medications Discontinued During This Encounter  Medication Reason  . carvedilol (COREG) 25 MG tablet     Patient Active Problem List   Diagnosis Date Noted  . 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 Pericarditis 06/04/2013  . GERD (gastroesophageal reflux disease) 07/11/2012  . Ischemic cardiomyopathy   . Schizophrenia   . Chronic obstructive pulmonary disease   . Anemia, normocytic normochromic   . Tobacco abuse 08/17/2011  . ESRD (end stage renal disease) 07/17/2011  . HYPERTENSION 05/21/2009    LABS    Component Value Date/Time   NA 133* 04/22/2013 1435   NA 138 04/21/2013 0243   NA 136 04/20/2013 1330   K 5.1 04/22/2013 1435   K 3.9 04/21/2013 0243   K 4.7 04/20/2013 1330   CL 92* 04/22/2013 1435   CL 90* 04/21/2013 0243   CL 92* 04/20/2013 1330   CO2 22 04/22/2013 1435   CO2 26 04/21/2013 0243   CO2 24 04/20/2013 1330   GLUCOSE 104* 04/22/2013 1435   GLUCOSE 104* 04/21/2013 0243   GLUCOSE 118* 04/20/2013 1330   BUN 70* 04/22/2013 1435   BUN 33* 04/21/2013 0243   BUN 42* 04/20/2013 1330   CREATININE 5.03* 04/22/2013 1435   CREATININE 3.18* 04/21/2013 0243   CREATININE 4.56* 04/20/2013 1330    CALCIUM 9.6 04/22/2013 1435   CALCIUM 10.2 04/22/2013 1435   CALCIUM 10.0 04/21/2013 0243   CALCIUM 10.1 04/20/2013 1330   GFRNONAA 13* 04/22/2013 1435   GFRNONAA 23* 04/21/2013 0243   GFRNONAA 15* 04/20/2013 1330   GFRAA 15* 04/22/2013 1435   GFRAA 27* 04/21/2013 0243   GFRAA 17* 04/20/2013 1330   CMP     Component Value Date/Time   NA 133* 04/22/2013 1435   K 5.1 04/22/2013 1435   CL 92* 04/22/2013 1435   CO2 22 04/22/2013 1435   GLUCOSE 104* 04/22/2013 1435   BUN 70* 04/22/2013 1435   CREATININE 5.03* 04/22/2013 1435   CALCIUM 9.6 04/22/2013 1435   CALCIUM 10.2 04/22/2013 1435   PROT 6.3 03/23/2013 0625   ALBUMIN 3.1* 04/22/2013 1435   AST 28 03/23/2013 0625   ALT 21 03/23/2013 0625   ALKPHOS 228* 03/23/2013 0625   BILITOT 0.7 03/23/2013 0625   GFRNONAA 13* 04/22/2013 1435   GFRAA 15* 04/22/2013 1435       Component Value Date/Time   WBC 11.5* 04/22/2013 1435   WBC 12.3* 04/22/2013 0440   WBC 8.8 04/21/2013 0243   HGB 8.5* 04/22/2013 1435   HGB 8.4* 04/22/2013 0440   HGB 8.1* 04/21/2013 0243   HCT 24.7* 04/22/2013 1435   HCT 24.8* 04/22/2013 0440   HCT 24.1* 04/21/2013 0243   MCV 89.8 04/22/2013 1435   MCV 90.5 04/22/2013 0440   MCV 91.6 04/21/2013 0243    Lipid Panel     Component Value Date/Time   CHOL  Value: 160        ATP III CLASSIFICATION:  <200     mg/dL   Desirable  161-096  mg/dL   Borderline High  >=045    mg/dL   High        40/98/1191 0538   TRIG 77 10/07/2010 0538   HDL 82 10/07/2010 0538   CHOLHDL 2.0 10/07/2010 0538   VLDL 15 10/07/2010 0538   LDLCALC  Value: 63        Total Cholesterol/HDL:CHD Risk Coronary Heart Disease Risk Table  Men   Women  1/2 Average Risk   3.4   3.3  Average Risk       5.0   4.4  2 X Average Risk   9.6   7.1  3 X Average Risk  23.4   11.0        Use the calculated Patient Ratio above and the CHD Risk Table to determine the patient's CHD Risk.        ATP III CLASSIFICATION (LDL):  <100     mg/dL   Optimal  161-096  mg/dL   Near or Above                    Optimal  130-159   mg/dL   Borderline  045-409  mg/dL   High  >811     mg/dL   Very High 91/47/8295 0538    ABG    Component Value Date/Time   PHART 7.364 04/19/2013 1845   PCO2ART 39.4 04/19/2013 1845   PO2ART 81.1 04/19/2013 1845   HCO3 21.9 04/19/2013 1845   TCO2 20.7 04/19/2013 1845   ACIDBASEDEF 2.6* 04/19/2013 1845   O2SAT 94.6 04/19/2013 1845     Lab Results  Component Value Date   TSH 4.559* 02/19/2013   BNP (last 3 results)  Recent Labs  02/18/13 1402 03/21/13 1843  PROBNP >70000.0* >70000.0*   Cardiac Panel (last 3 results) No results found for this basename: CKTOTAL, CKMB, TROPONINI, RELINDX,  in the last 72 hours  Iron/TIBC/Ferritin    Component Value Date/Time   IRON 151* 04/22/2013 1435   TIBC 200* 04/22/2013 1435   FERRITIN 3170* 04/22/2013 1435     EKG Orders placed in visit on 06/04/13  . EKG 12-LEAD     Prior Assessment and Plan Problem List as of 07/30/2013     Cardiovascular and Mediastinum   HYPERTENSION   Last Assessment & Plan   06/04/2013 Office Visit Written 06/04/2013  4:41 PM by Kathlen Brunswick, MD     Systolic BP has been very mildly elevated of late and DBP generally well controlled.  Dose of carvedilol will be increased for enhanced therapy of cardiomyopathy and to further lower blood pressure.    Ischemic cardiomyopathy   Last Assessment & Plan   06/04/2013 Office Visit Edited 06/04/2013  4:44 PM by Kathlen Brunswick, MD     EF has deteriorated somewhat since 2010 when a catheterization revealed a value of 40-45% and a recent echocardiogram reporting an EF of 30-35%. We will continue to attempt to optimize treatment, initially by increasing carvedilol to a dose of 25 mg twice a day.  Echocardiogram will be repeated to assess LV function and to reassess apparent intracardiac masses in the LA and LV.    Pericarditis   Last Assessment & Plan   06/04/2013 Office Visit Written 06/04/2013  4:43 PM by Kathlen Brunswick, MD     Patient has a very prominent pericardial rub in the  absence of symptoms. An echocardiogram is pending to assess the presence or absence of pericardial fluid. There is no evidence on physical exam for tamponade nor any evidence for hemodynamic compromise. Patient may require more intensive dialysis or treatment with anti-inflammatory medication. This will be discussed with Dr. Kristian Covey once echocardiographic results are available and the patient has been reassessed in 2 weeks.     Mitral valve mass     Respiratory   Chronic obstructive pulmonary disease   Last Assessment & Plan  11/17/2011 Office Visit Written 11/18/2011 10:00 AM by Kathlen Brunswick, MD     Symptoms are fairly well controlled at present.  Tobacco cessation is highly desirable, but patient indicates no willingness to seriously attempt to quit.      Digestive   GERD (gastroesophageal reflux disease)     Genitourinary   ESRD (end stage renal disease)   Last Assessment & Plan   11/17/2011 Office Visit Written 11/18/2011 10:00 AM by Kathlen Brunswick, MD     Patient is above his dry weight and is receiving extra dialysis to bring his fluid status back to normal.      Other   Tobacco abuse   Last Assessment & Plan   08/17/2011 Office Visit Written 08/17/2011 12:34 PM by Dyann Kief, PA     Patient continues to smoke 7 cigarettes daily. He has cut down from 3 packs per day. We discussed the importance of smoking cessation.    Schizophrenia   Anemia, normocytic normochromic   Last Assessment & Plan   06/04/2013 Office Visit Written 06/04/2013  4:33 PM by Kathlen Brunswick, MD     Moderately severe anemia persists, likely related to chronic kidney disease. Dr. Kristian Covey is managing.        Imaging: No results found.

## 2013-08-02 ENCOUNTER — Ambulatory Visit: Payer: Medicare Other | Admitting: Adult Health

## 2013-08-06 ENCOUNTER — Encounter: Payer: Self-pay | Admitting: Cardiology

## 2013-08-09 ENCOUNTER — Other Ambulatory Visit: Payer: Self-pay | Admitting: Gastroenterology

## 2013-08-17 IMAGING — CR DG CHEST 2V
3 series · 3 of 3 positions shown · non-contrast
Comparison: Chest x-ray 09/18/2011.

CLINICAL DATA: Chest pain.

CHEST - 2 VIEW

[view not recorded (1 of 3)]
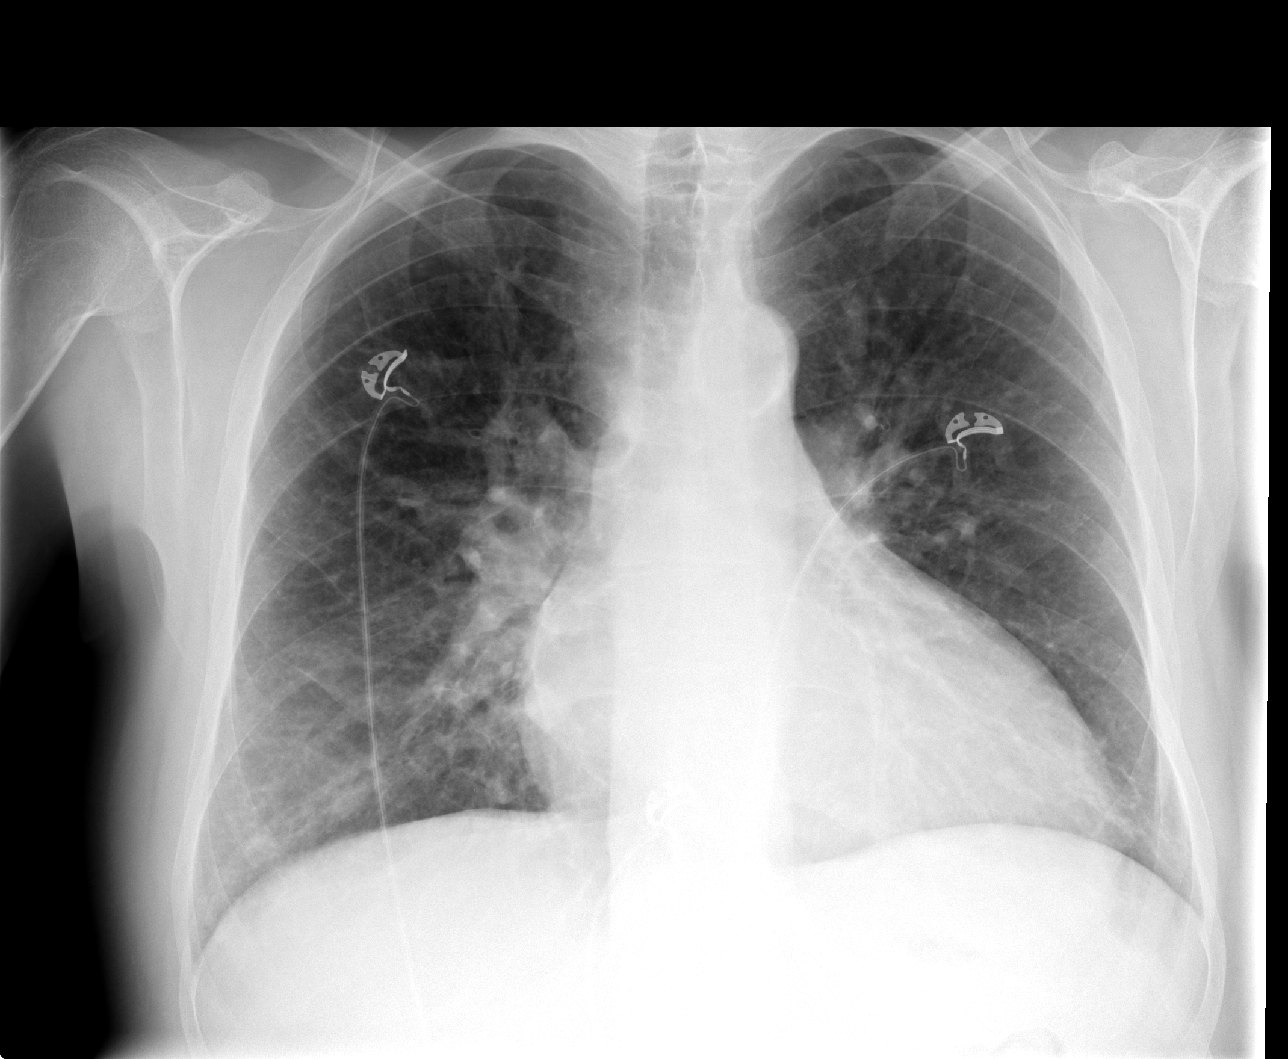

[view not recorded (2 of 3)]
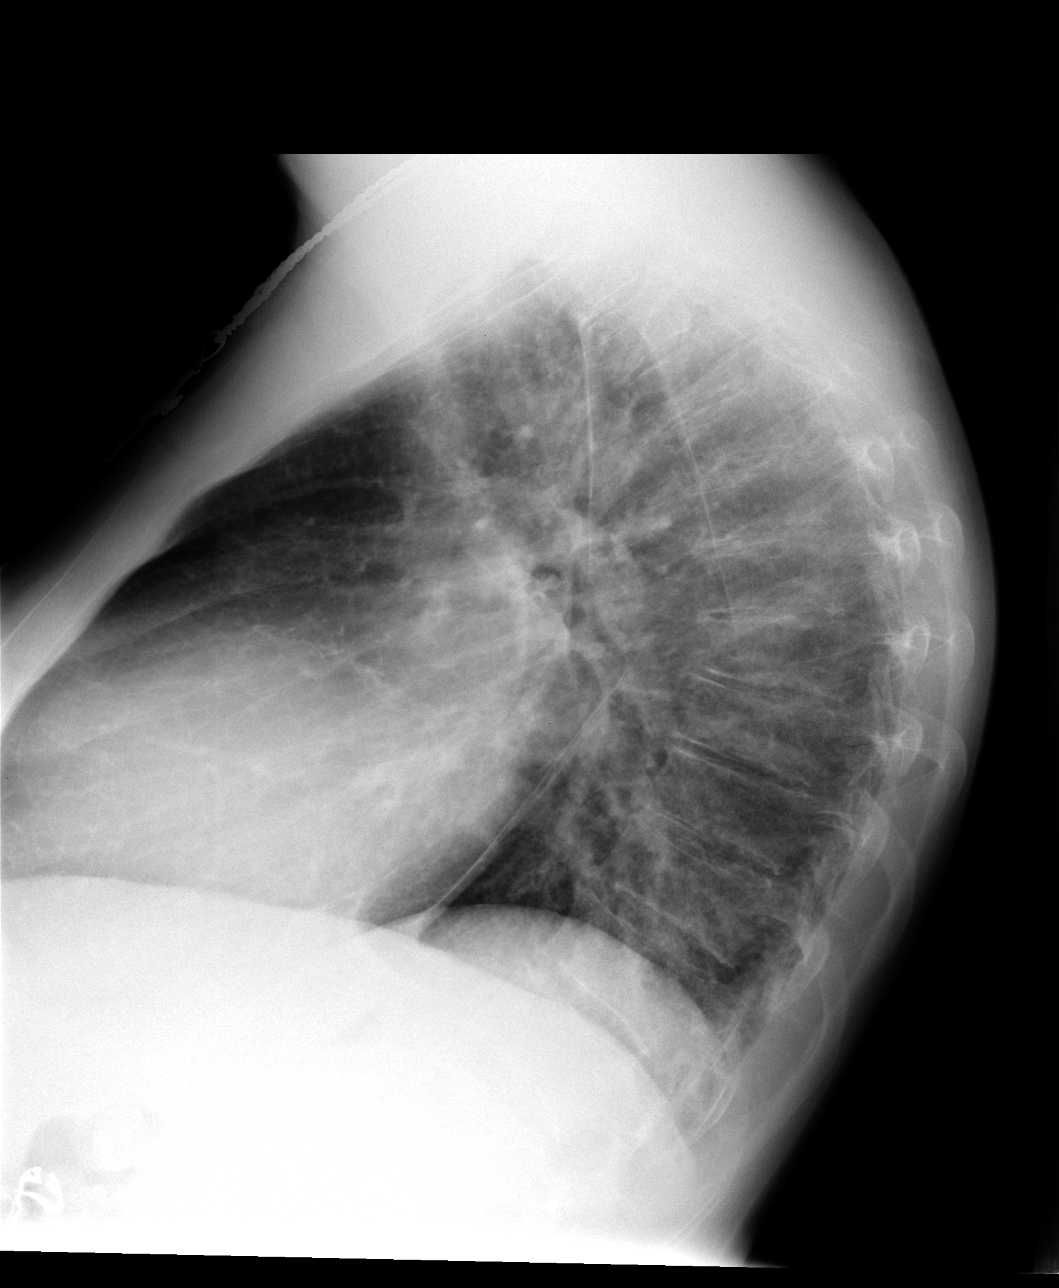

[view not recorded (3 of 3)]
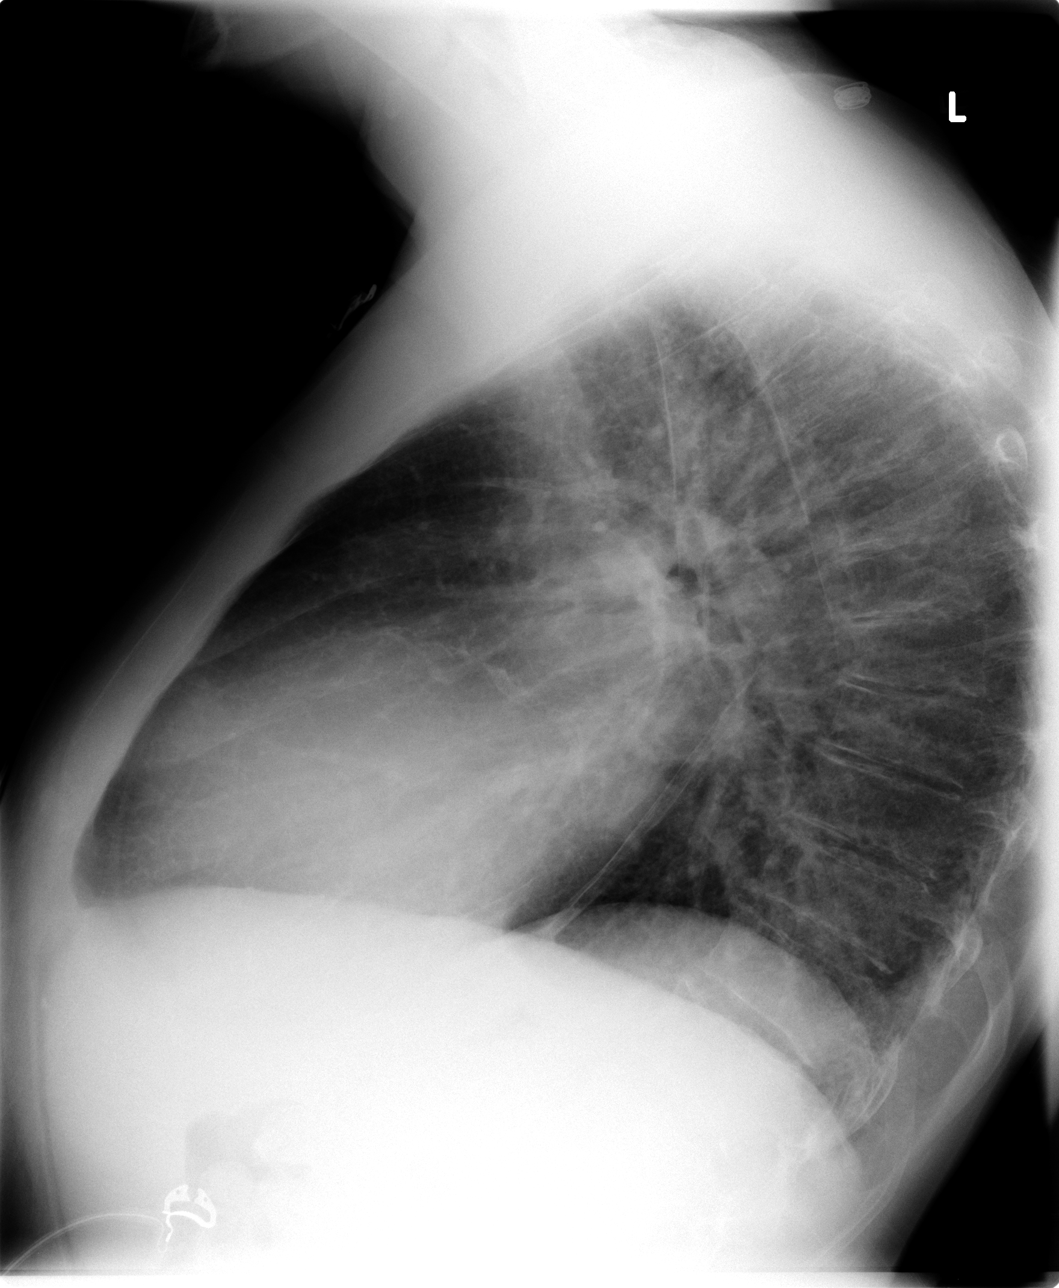

[3 of 3 positions shown; findings below may reference images not displayed]

FINDINGS: Lung volumes are normal.  No consolidative airspace
disease.  No pleural effusions.  No evidence of edema.  Dilated
central pulmonary arteries similar to priors, suggestive of
potential pulmonary arterial hypertension.  Heart size is mildly
enlarged (unchanged).  Mediastinal contours are otherwise
unremarkable.  Atherosclerotic calcifications in the arch of the
aorta.
IMPRESSION: 1.  No radiographic evidence of acute cardiopulmonary disease.
2.  Mild cardiomegaly, unchanged.
3.  Dilated pulmonary arteries suggestive of pulmonary arterial
hypertension.
4.  Atherosclerosis.

## 2013-09-09 IMAGING — CR DG KNEE COMPLETE 4+V*R*
4 series · 4 of 4 positions shown · non-contrast
Comparison: None.

CLINICAL DATA: Diffuse right knee pain since a fall on the knee 6
days ago.

RIGHT KNEE - COMPLETE 4+ VIEW

[view not recorded (1 of 4)]
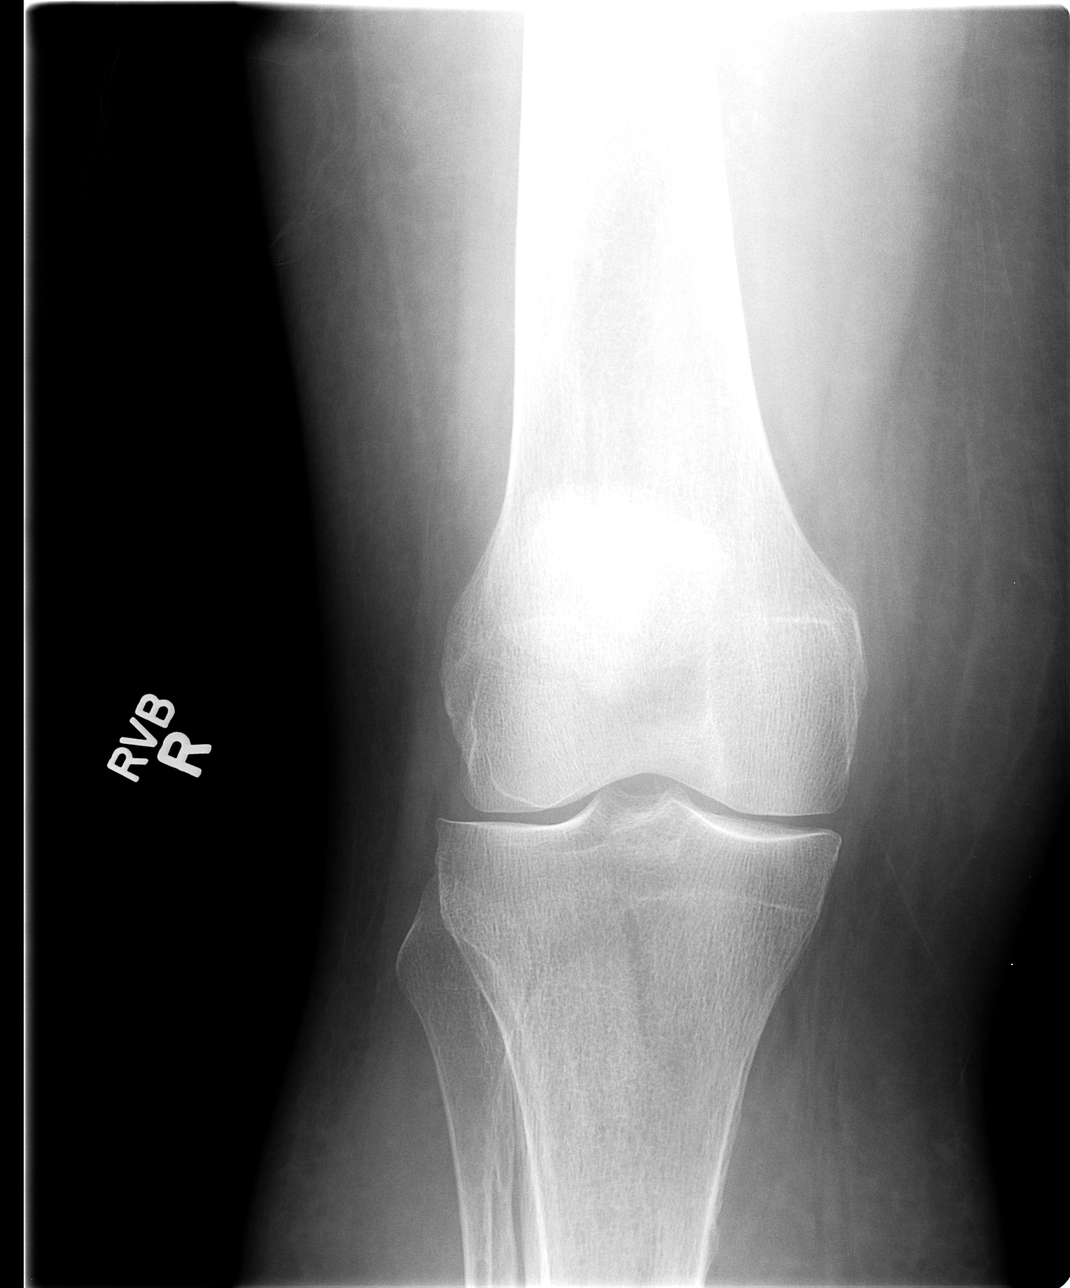

[view not recorded (2 of 4)]
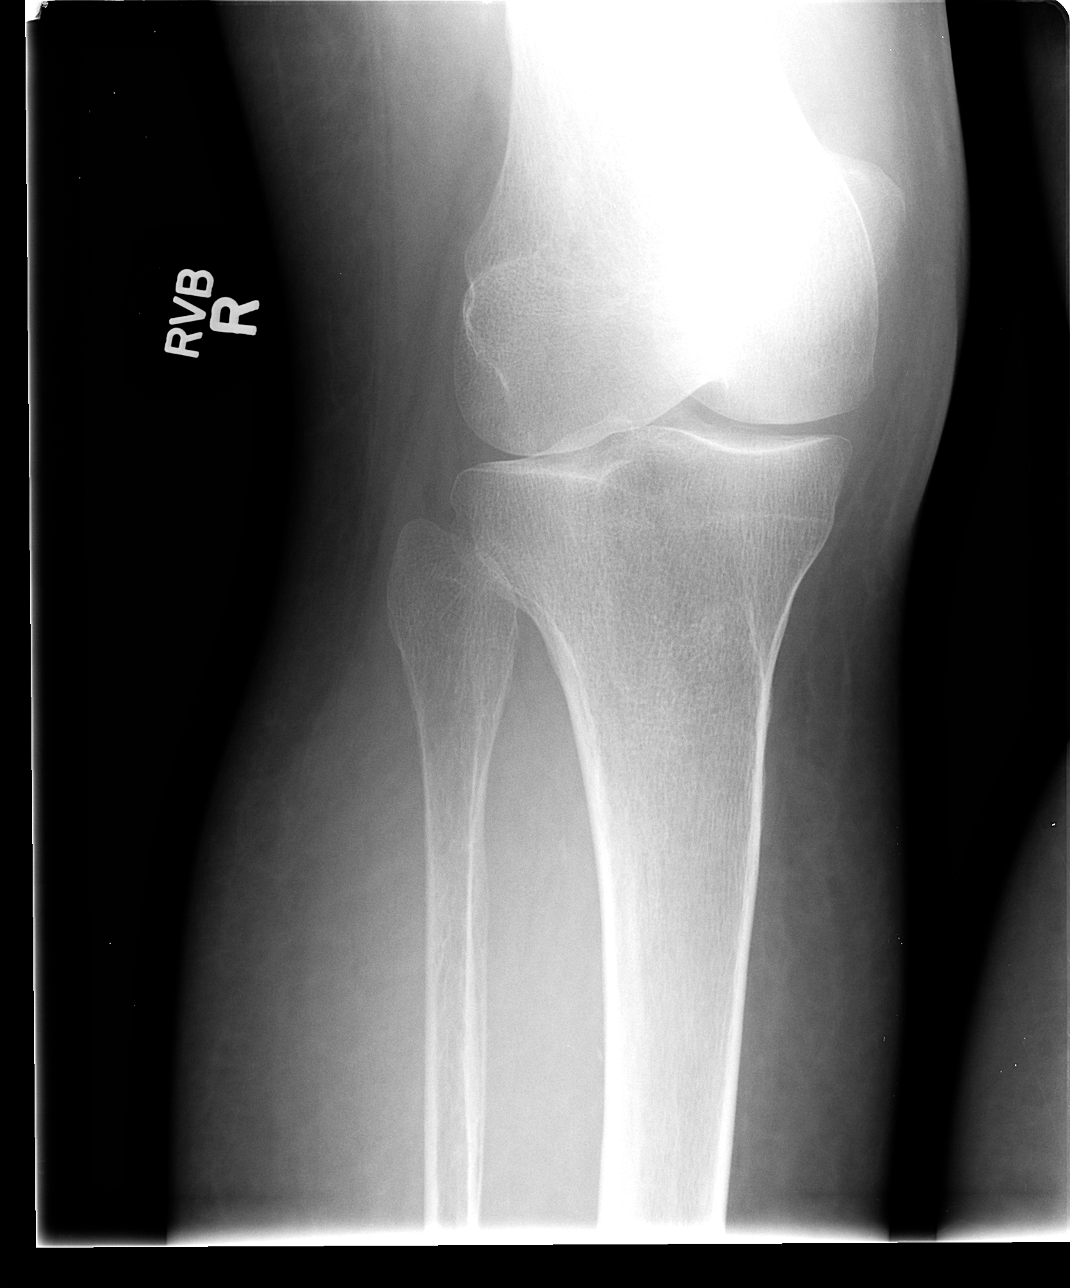

[view not recorded (3 of 4)]
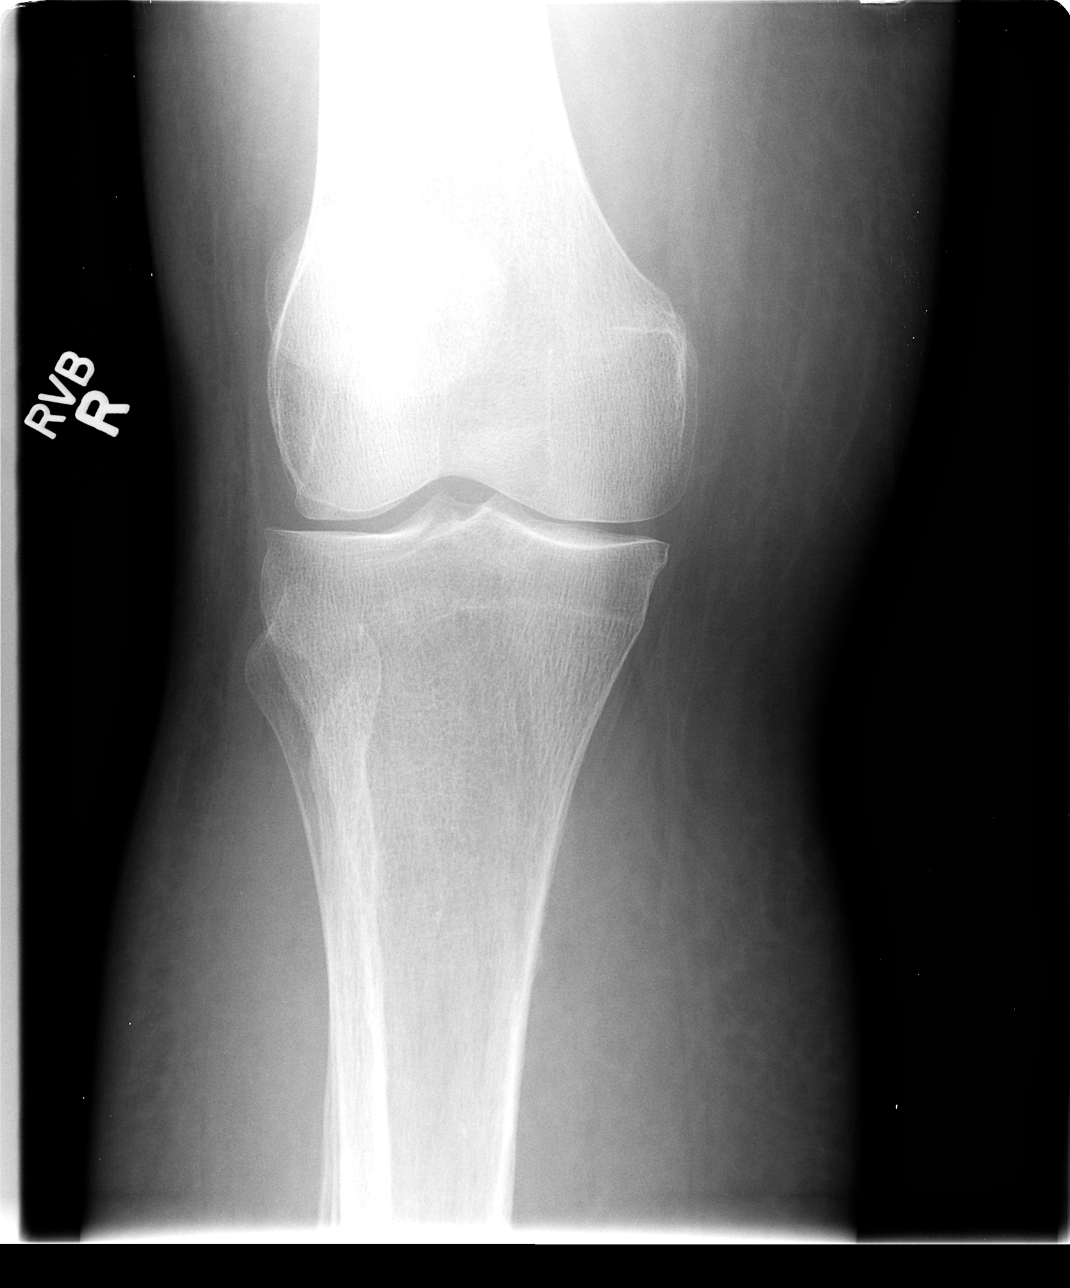

[view not recorded (4 of 4)]
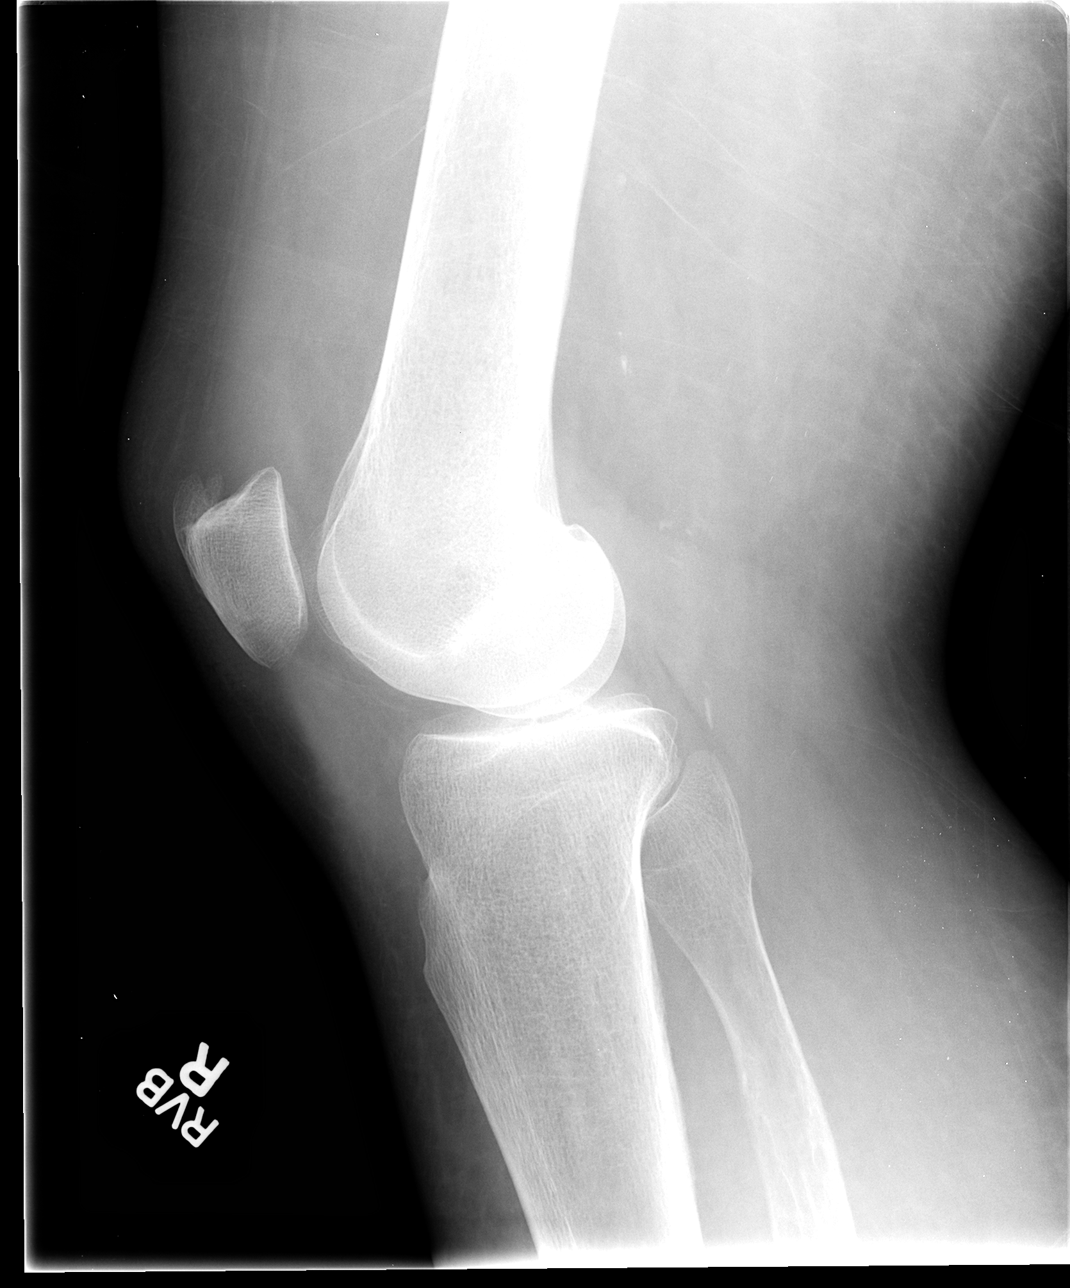

[4 of 4 positions shown; findings below may reference images not displayed]

FINDINGS: Large anterior patellar spur at the site of quadriceps
tendon insertion.  Moderate medial and lateral joint space
narrowing and associated minimal spur formation.  No fracture,
dislocation or effusion seen.
IMPRESSION: 1.  No fracture or effusion.
2.  Degenerative changes, as described above.

## 2013-09-10 ENCOUNTER — Emergency Department (HOSPITAL_COMMUNITY): Payer: Medicare Other

## 2013-09-10 ENCOUNTER — Emergency Department (HOSPITAL_COMMUNITY)
Admission: EM | Admit: 2013-09-10 | Discharge: 2013-09-10 | Disposition: A | Discharge status: Home / Self Care | Payer: Medicare Other | Attending: Emergency Medicine | Admitting: Emergency Medicine

## 2013-09-10 ENCOUNTER — Encounter (HOSPITAL_COMMUNITY): Payer: Self-pay

## 2013-09-10 DIAGNOSIS — J449 Chronic obstructive pulmonary disease, unspecified: Secondary | ICD-10-CM | POA: Insufficient documentation

## 2013-09-10 DIAGNOSIS — Z7982 Long term (current) use of aspirin: Secondary | ICD-10-CM | POA: Insufficient documentation

## 2013-09-10 DIAGNOSIS — F319 Bipolar disorder, unspecified: Secondary | ICD-10-CM | POA: Insufficient documentation

## 2013-09-10 DIAGNOSIS — N186 End stage renal disease: Secondary | ICD-10-CM | POA: Insufficient documentation

## 2013-09-10 DIAGNOSIS — G43909 Migraine, unspecified, not intractable, without status migrainosus: Secondary | ICD-10-CM | POA: Insufficient documentation

## 2013-09-10 DIAGNOSIS — S8992XA Unspecified injury of left lower leg, initial encounter: Secondary | ICD-10-CM

## 2013-09-10 DIAGNOSIS — Y929 Unspecified place or not applicable: Secondary | ICD-10-CM | POA: Insufficient documentation

## 2013-09-10 DIAGNOSIS — Z79899 Other long term (current) drug therapy: Secondary | ICD-10-CM | POA: Insufficient documentation

## 2013-09-10 DIAGNOSIS — Z992 Dependence on renal dialysis: Secondary | ICD-10-CM | POA: Insufficient documentation

## 2013-09-10 DIAGNOSIS — Z862 Personal history of diseases of the blood and blood-forming organs and certain disorders involving the immune mechanism: Secondary | ICD-10-CM | POA: Insufficient documentation

## 2013-09-10 DIAGNOSIS — I509 Heart failure, unspecified: Secondary | ICD-10-CM | POA: Insufficient documentation

## 2013-09-10 DIAGNOSIS — J4489 Other specified chronic obstructive pulmonary disease: Secondary | ICD-10-CM | POA: Insufficient documentation

## 2013-09-10 DIAGNOSIS — Z9861 Coronary angioplasty status: Secondary | ICD-10-CM | POA: Insufficient documentation

## 2013-09-10 DIAGNOSIS — Y939 Activity, unspecified: Secondary | ICD-10-CM | POA: Insufficient documentation

## 2013-09-10 DIAGNOSIS — F209 Schizophrenia, unspecified: Secondary | ICD-10-CM | POA: Insufficient documentation

## 2013-09-10 DIAGNOSIS — S0990XA Unspecified injury of head, initial encounter: Secondary | ICD-10-CM | POA: Insufficient documentation

## 2013-09-10 DIAGNOSIS — F17200 Nicotine dependence, unspecified, uncomplicated: Secondary | ICD-10-CM | POA: Insufficient documentation

## 2013-09-10 DIAGNOSIS — X500XXA Overexertion from strenuous movement or load, initial encounter: Secondary | ICD-10-CM | POA: Insufficient documentation

## 2013-09-10 DIAGNOSIS — I120 Hypertensive chronic kidney disease with stage 5 chronic kidney disease or end stage renal disease: Secondary | ICD-10-CM | POA: Insufficient documentation

## 2013-09-10 DIAGNOSIS — Z8701 Personal history of pneumonia (recurrent): Secondary | ICD-10-CM | POA: Insufficient documentation

## 2013-09-10 DIAGNOSIS — S8990XA Unspecified injury of unspecified lower leg, initial encounter: Secondary | ICD-10-CM | POA: Insufficient documentation

## 2013-09-10 DIAGNOSIS — W19XXXA Unspecified fall, initial encounter: Secondary | ICD-10-CM | POA: Insufficient documentation

## 2013-09-10 MED ORDER — ACETAMINOPHEN 325 MG PO TABS
650.0000 mg | ORAL_TABLET | Freq: Once | ORAL | Status: AC
  Administered 2013-09-10: 650 mg via ORAL
  Filled 2013-09-10: qty 2

## 2013-09-10 NOTE — ED Notes (Signed)
Pt reports twisted left knee yesterday and fell.  Also c/o migraine since around 0500 this morning.  Reports nausea, no vomiting.

## 2013-09-10 NOTE — ED Notes (Signed)
Pt stated that he was leaving and did not need discharge instructions, states doctor gave him verbal discharge instructions
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
## 2013-09-12 ENCOUNTER — Emergency Department (HOSPITAL_COMMUNITY)
Admission: EM | Admit: 2013-09-12 | Discharge: 2013-09-12 | Disposition: A | Discharge status: Home / Self Care | Payer: Medicare Other | Attending: Emergency Medicine | Admitting: Emergency Medicine

## 2013-09-12 ENCOUNTER — Encounter (HOSPITAL_COMMUNITY): Payer: Self-pay

## 2013-09-12 DIAGNOSIS — W19XXXA Unspecified fall, initial encounter: Secondary | ICD-10-CM | POA: Insufficient documentation

## 2013-09-12 DIAGNOSIS — Y929 Unspecified place or not applicable: Secondary | ICD-10-CM | POA: Insufficient documentation

## 2013-09-12 DIAGNOSIS — N186 End stage renal disease: Secondary | ICD-10-CM | POA: Insufficient documentation

## 2013-09-12 DIAGNOSIS — Z992 Dependence on renal dialysis: Secondary | ICD-10-CM | POA: Insufficient documentation

## 2013-09-12 DIAGNOSIS — F17200 Nicotine dependence, unspecified, uncomplicated: Secondary | ICD-10-CM | POA: Insufficient documentation

## 2013-09-12 DIAGNOSIS — T148XXA Other injury of unspecified body region, initial encounter: Secondary | ICD-10-CM

## 2013-09-12 DIAGNOSIS — F319 Bipolar disorder, unspecified: Secondary | ICD-10-CM | POA: Insufficient documentation

## 2013-09-12 DIAGNOSIS — Z8701 Personal history of pneumonia (recurrent): Secondary | ICD-10-CM | POA: Insufficient documentation

## 2013-09-12 DIAGNOSIS — Z9861 Coronary angioplasty status: Secondary | ICD-10-CM | POA: Insufficient documentation

## 2013-09-12 DIAGNOSIS — I120 Hypertensive chronic kidney disease with stage 5 chronic kidney disease or end stage renal disease: Secondary | ICD-10-CM | POA: Insufficient documentation

## 2013-09-12 DIAGNOSIS — J4489 Other specified chronic obstructive pulmonary disease: Secondary | ICD-10-CM | POA: Insufficient documentation

## 2013-09-12 DIAGNOSIS — G8929 Other chronic pain: Secondary | ICD-10-CM | POA: Insufficient documentation

## 2013-09-12 DIAGNOSIS — D649 Anemia, unspecified: Secondary | ICD-10-CM | POA: Insufficient documentation

## 2013-09-12 DIAGNOSIS — Y9389 Activity, other specified: Secondary | ICD-10-CM | POA: Insufficient documentation

## 2013-09-12 DIAGNOSIS — S335XXA Sprain of ligaments of lumbar spine, initial encounter: Secondary | ICD-10-CM | POA: Insufficient documentation

## 2013-09-12 DIAGNOSIS — F209 Schizophrenia, unspecified: Secondary | ICD-10-CM | POA: Insufficient documentation

## 2013-09-12 DIAGNOSIS — I10 Essential (primary) hypertension: Secondary | ICD-10-CM | POA: Insufficient documentation

## 2013-09-12 DIAGNOSIS — Z7982 Long term (current) use of aspirin: Secondary | ICD-10-CM | POA: Insufficient documentation

## 2013-09-12 DIAGNOSIS — G43909 Migraine, unspecified, not intractable, without status migrainosus: Secondary | ICD-10-CM | POA: Insufficient documentation

## 2013-09-12 DIAGNOSIS — J449 Chronic obstructive pulmonary disease, unspecified: Secondary | ICD-10-CM | POA: Insufficient documentation

## 2013-09-12 MED ORDER — ACETAMINOPHEN 325 MG PO TABS
650.0000 mg | ORAL_TABLET | Freq: Once | ORAL | Status: AC
  Administered 2013-09-12: 650 mg via ORAL
  Filled 2013-09-12: qty 2

## 2013-09-12 MED ORDER — CYCLOBENZAPRINE HCL 5 MG PO TABS: 5.0000 mg | ORAL_TABLET | Freq: Three times a day (TID) | ORAL | Status: AC | PRN

## 2013-09-12 MED ORDER — CYCLOBENZAPRINE HCL 10 MG PO TABS
10.0000 mg | ORAL_TABLET | Freq: Once | ORAL | Status: AC
  Administered 2013-09-12: 10 mg via ORAL
  Filled 2013-09-12: qty 1

## 2013-09-12 NOTE — ED Notes (Signed)
Pt c/o lower back pain since fall a few days ago.

## 2013-09-14 IMAGING — CR DG CHEST 2V
2 series · 2 of 2 positions shown · non-contrast
Comparison: 02/02/2012

CLINICAL DATA: Chest pain, cough, shortness of breath.

CHEST - 2 VIEW

[view not recorded (1 of 2)]
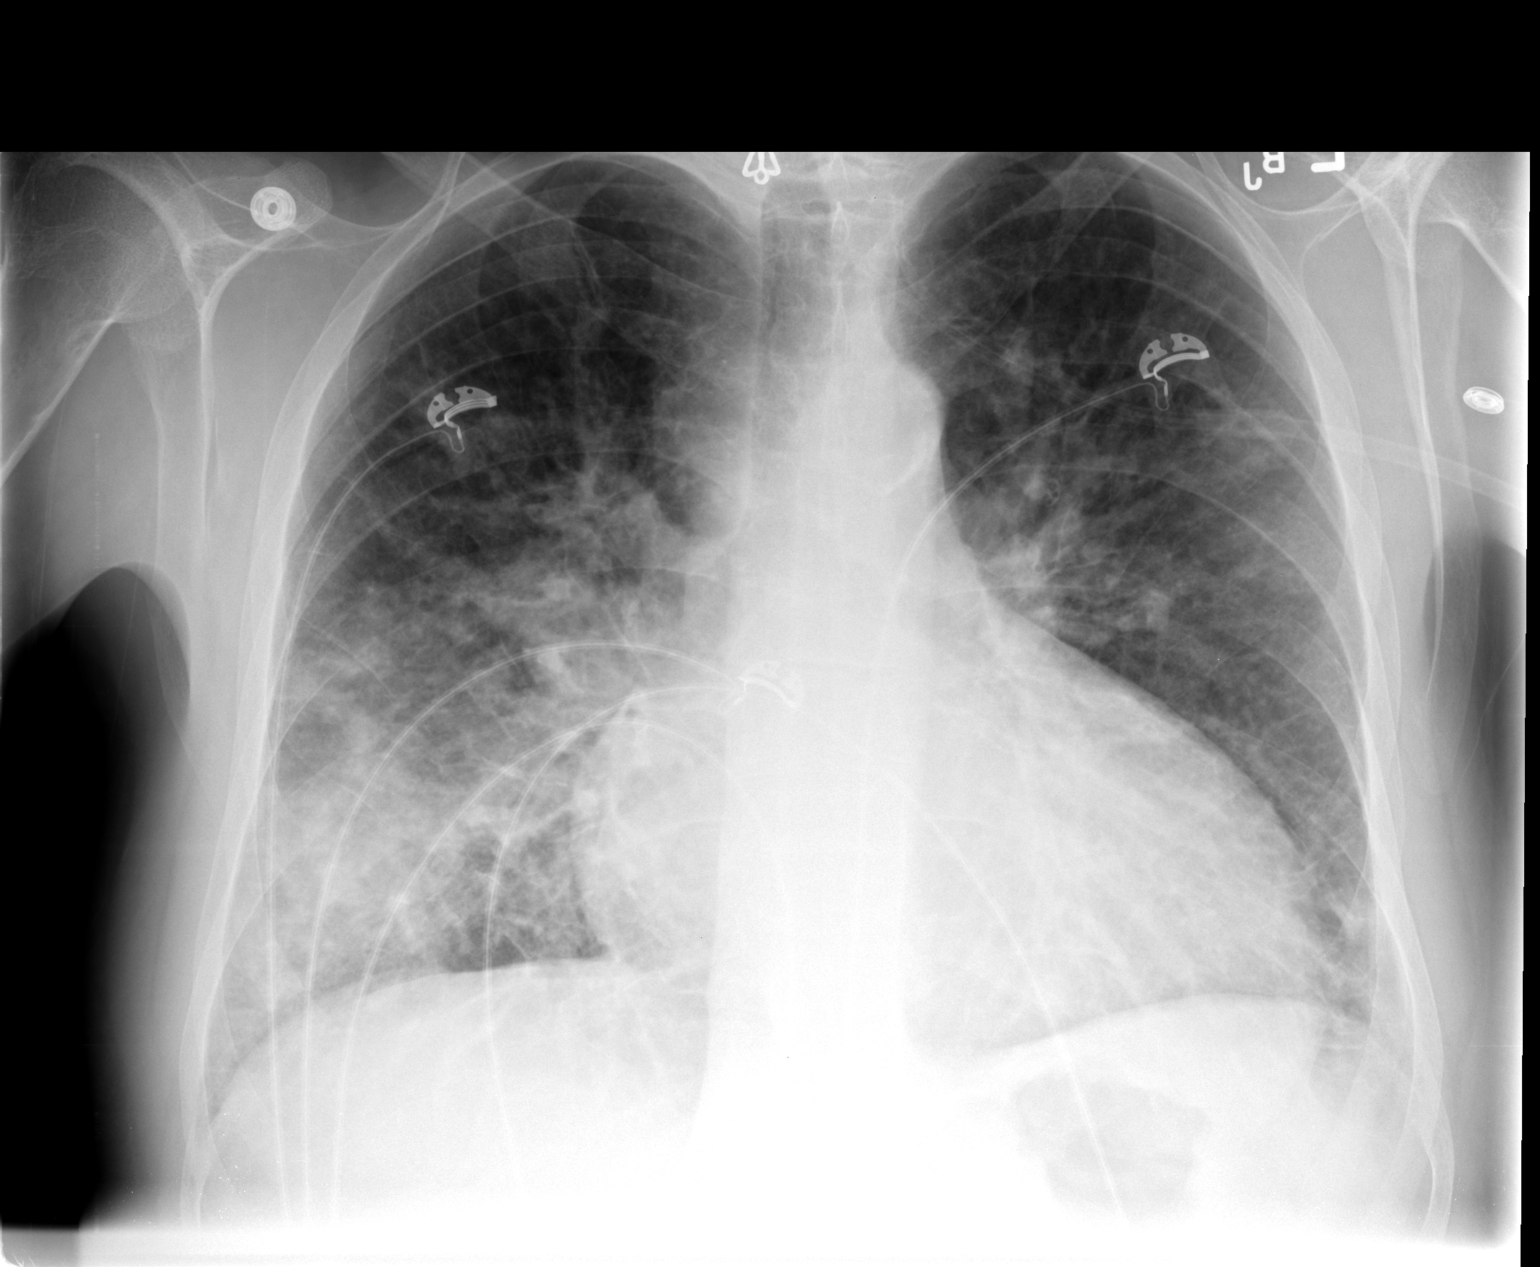

[view not recorded (2 of 2)]
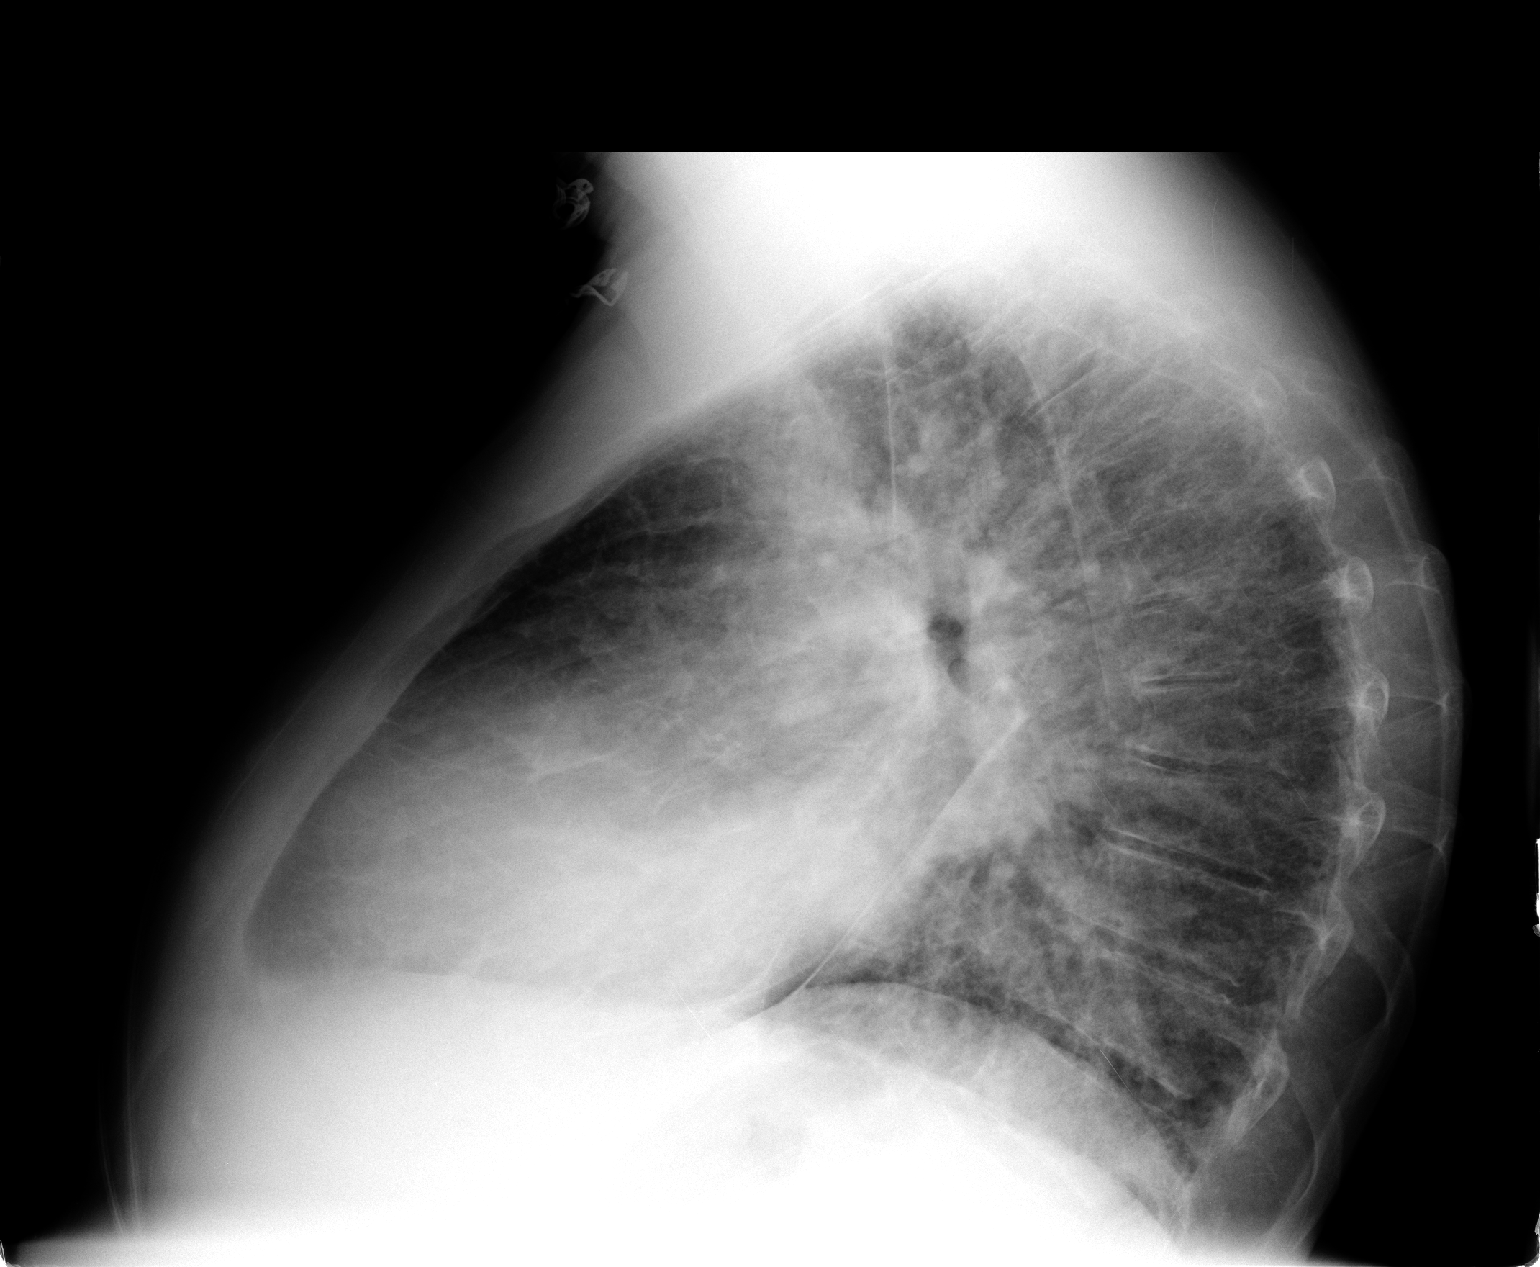

[2 of 2 positions shown; findings below may reference images not displayed]

FINDINGS: Cardiomegaly.  Perihilar opacity and central vascular
fullness.  Right greater than left lower lobe consolidations.  No
pneumothorax.  Aortic arch atherosclerosis.  Multilevel
degenerative changes, exaggerated kyphosis, osteopenia.
IMPRESSION: Cardiomegaly with central vascular congestion and right greater
than left airspace opacities..  Pulmonary edema versus infection.
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
## 2013-09-16 NOTE — ED Provider Notes (Signed)
Medical screening examination/treatment/procedure(s) were performed by non-physician practitioner and as supervising physician I was immediately available for consultation/collaboration.  Zalma Channing, MD 09/16/13 1648 

## 2013-10-29 ENCOUNTER — Emergency Department (HOSPITAL_COMMUNITY): Payer: Medicare Other

## 2013-10-29 ENCOUNTER — Emergency Department (HOSPITAL_COMMUNITY)
Admission: EM | Admit: 2013-10-29 | Discharge: 2013-10-29 | Disposition: A | Discharge status: Home / Self Care | Payer: Medicare Other | Attending: Emergency Medicine | Admitting: Emergency Medicine

## 2013-10-29 ENCOUNTER — Encounter (HOSPITAL_COMMUNITY): Payer: Self-pay | Admitting: Emergency Medicine

## 2013-10-29 DIAGNOSIS — Y939 Activity, unspecified: Secondary | ICD-10-CM | POA: Insufficient documentation

## 2013-10-29 DIAGNOSIS — I120 Hypertensive chronic kidney disease with stage 5 chronic kidney disease or end stage renal disease: Secondary | ICD-10-CM | POA: Insufficient documentation

## 2013-10-29 DIAGNOSIS — F17200 Nicotine dependence, unspecified, uncomplicated: Secondary | ICD-10-CM | POA: Insufficient documentation

## 2013-10-29 DIAGNOSIS — G894 Chronic pain syndrome: Secondary | ICD-10-CM | POA: Insufficient documentation

## 2013-10-29 DIAGNOSIS — J449 Chronic obstructive pulmonary disease, unspecified: Secondary | ICD-10-CM | POA: Insufficient documentation

## 2013-10-29 DIAGNOSIS — F319 Bipolar disorder, unspecified: Secondary | ICD-10-CM | POA: Insufficient documentation

## 2013-10-29 DIAGNOSIS — N186 End stage renal disease: Secondary | ICD-10-CM | POA: Insufficient documentation

## 2013-10-29 DIAGNOSIS — L03116 Cellulitis of left lower limb: Secondary | ICD-10-CM

## 2013-10-29 DIAGNOSIS — Z79899 Other long term (current) drug therapy: Secondary | ICD-10-CM | POA: Insufficient documentation

## 2013-10-29 DIAGNOSIS — D649 Anemia, unspecified: Secondary | ICD-10-CM | POA: Insufficient documentation

## 2013-10-29 DIAGNOSIS — F209 Schizophrenia, unspecified: Secondary | ICD-10-CM | POA: Insufficient documentation

## 2013-10-29 DIAGNOSIS — Z8701 Personal history of pneumonia (recurrent): Secondary | ICD-10-CM | POA: Insufficient documentation

## 2013-10-29 DIAGNOSIS — G8929 Other chronic pain: Secondary | ICD-10-CM | POA: Insufficient documentation

## 2013-10-29 DIAGNOSIS — Z7982 Long term (current) use of aspirin: Secondary | ICD-10-CM | POA: Insufficient documentation

## 2013-10-29 DIAGNOSIS — W2203XA Walked into furniture, initial encounter: Secondary | ICD-10-CM | POA: Insufficient documentation

## 2013-10-29 DIAGNOSIS — Y929 Unspecified place or not applicable: Secondary | ICD-10-CM | POA: Insufficient documentation

## 2013-10-29 DIAGNOSIS — L02419 Cutaneous abscess of limb, unspecified: Secondary | ICD-10-CM | POA: Insufficient documentation

## 2013-10-29 DIAGNOSIS — Z992 Dependence on renal dialysis: Secondary | ICD-10-CM | POA: Insufficient documentation

## 2013-10-29 DIAGNOSIS — J4489 Other specified chronic obstructive pulmonary disease: Secondary | ICD-10-CM | POA: Insufficient documentation

## 2013-10-29 MED ORDER — CEPHALEXIN 500 MG PO CAPS
500.0000 mg | ORAL_CAPSULE | Freq: Once | ORAL | Status: AC
  Administered 2013-10-29: 500 mg via ORAL
  Filled 2013-10-29: qty 1

## 2013-10-29 MED ORDER — CEPHALEXIN 500 MG PO CAPS: 500.0000 mg | ORAL_CAPSULE | Freq: Four times a day (QID) | ORAL | Status: AC
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
## 2013-10-29 NOTE — ED Notes (Signed)
Patient reports hit left knee on desk 3 days ago. Complaining of pain and swelling to left knee that is not improving.

## 2013-10-30 NOTE — ED Provider Notes (Signed)
Medical screening examination/treatment/procedure(s) were conducted as a shared visit with non-physician practitioner(s) and myself.  I personally evaluated the patient during the encounter.  EKG Interpretation   None        Pt stable in the ED.  I feel he is safe/stable for outpatient management and f/u with PCP  Joya Gaskins, MD 10/30/13 [phone]

## 2013-11-04 ENCOUNTER — Other Ambulatory Visit (HOSPITAL_COMMUNITY): Payer: Self-pay | Admitting: Family Medicine

## 2013-11-04 ENCOUNTER — Ambulatory Visit (HOSPITAL_COMMUNITY)
Admission: RE | Admit: 2013-11-04 | Discharge: 2013-11-04 | Disposition: A | Discharge status: Home / Self Care | Payer: Medicare Other | Source: Ambulatory Visit | Attending: Family Medicine | Admitting: Family Medicine

## 2013-11-04 DIAGNOSIS — L539 Erythematous condition, unspecified: Secondary | ICD-10-CM

## 2013-11-04 DIAGNOSIS — R609 Edema, unspecified: Secondary | ICD-10-CM

## 2013-11-04 DIAGNOSIS — M79605 Pain in left leg: Secondary | ICD-10-CM

## 2013-11-04 DIAGNOSIS — L02419 Cutaneous abscess of limb, unspecified: Secondary | ICD-10-CM | POA: Insufficient documentation

## 2013-11-08 IMAGING — CR DG LUMBAR SPINE COMPLETE 4+V
5 series · 5 of 5 positions shown · non-contrast
Comparison: Lumbar spine films of 12/03/2011

CLINICAL DATA: Recent fall with low back pain, dialysis patient

LUMBAR SPINE - COMPLETE 4+ VIEW

[view not recorded (1 of 5)]
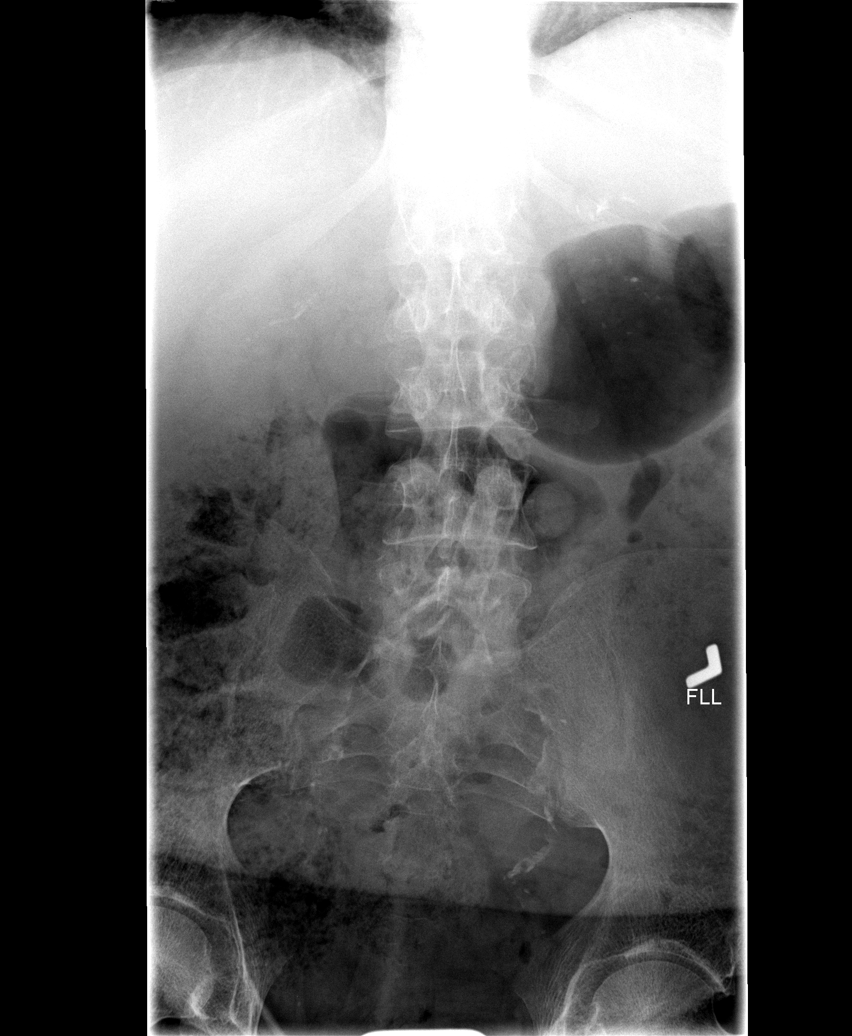

[view not recorded (2 of 5)]
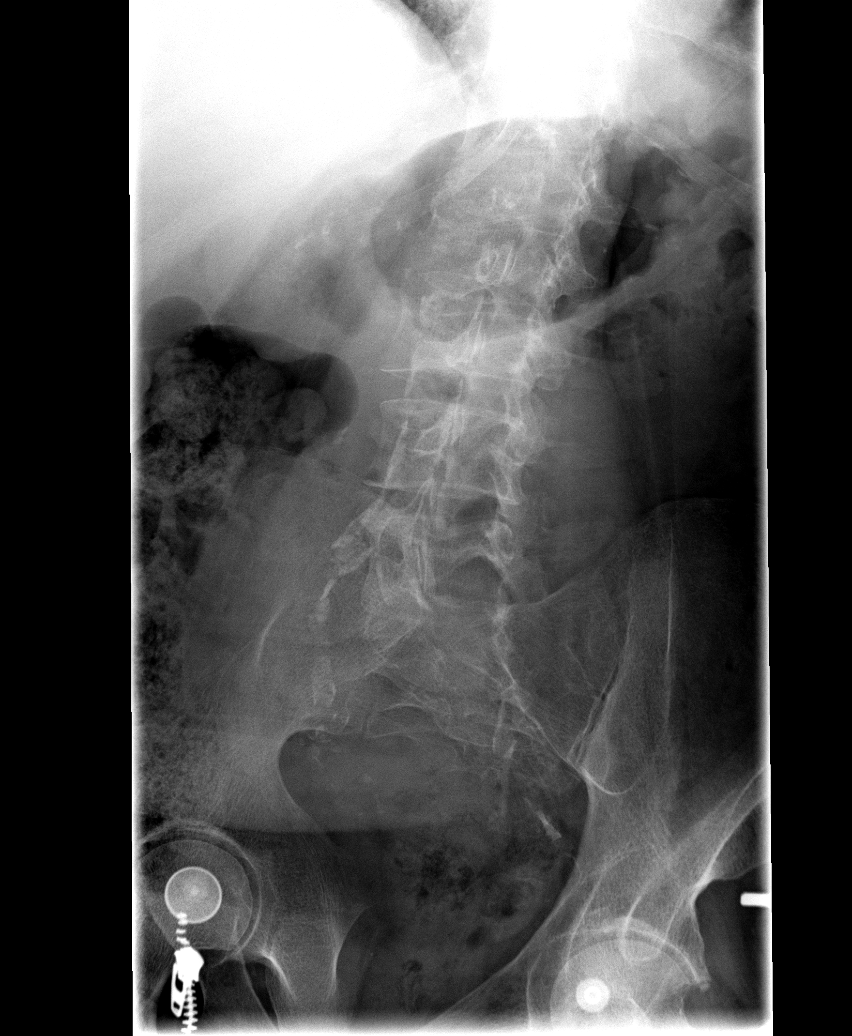

[view not recorded (3 of 5)]
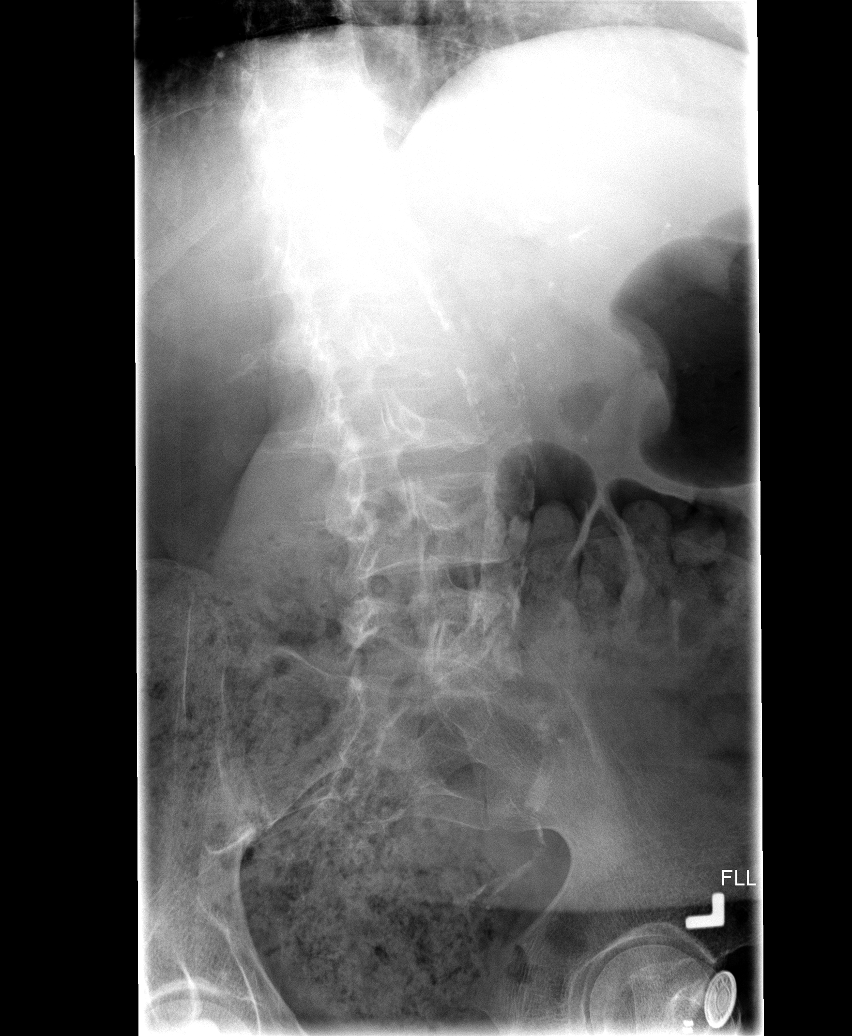

[view not recorded (4 of 5)]
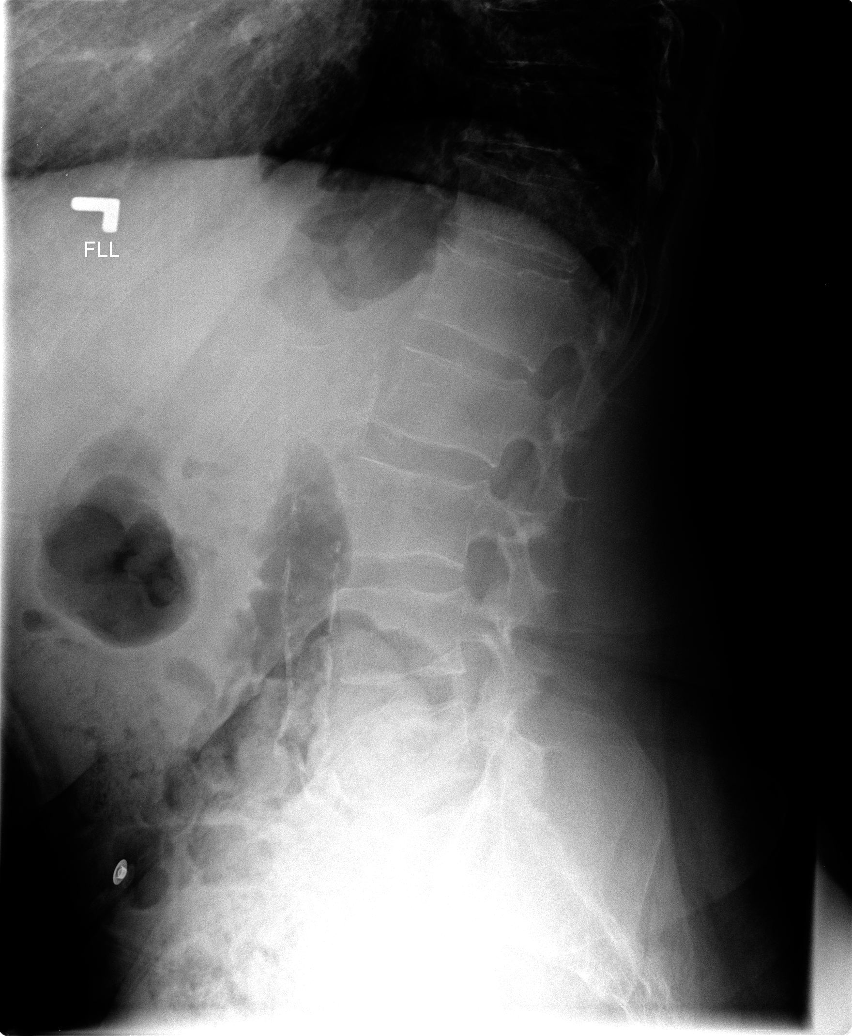

[view not recorded (5 of 5)]
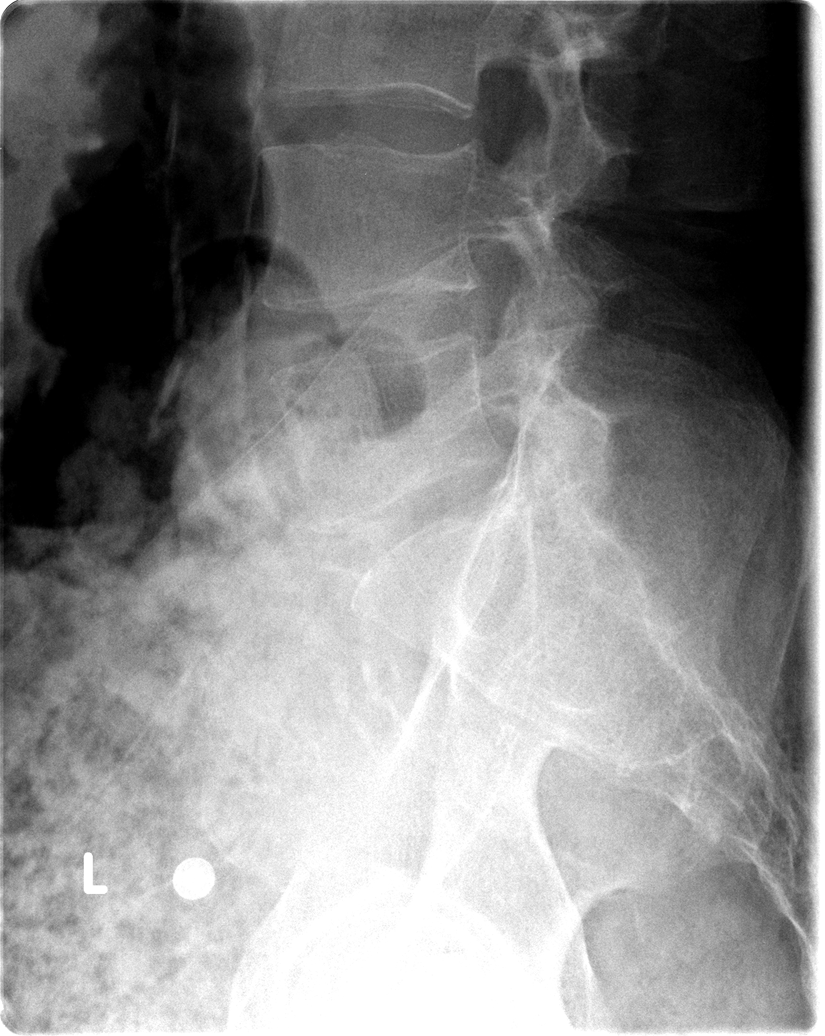

[5 of 5 positions shown; findings below may reference images not displayed]

FINDINGS: The lumbar vertebrae are in normal alignment. The bones
are somewhat osteopenic.  Intervertebral disc spaces are relatively
normal.  No compression deformity is seen.  The SI joints appear
normal.
IMPRESSION: Normal alignment with normal disc spaces.  Mild osteopenia.

## 2013-11-15 ENCOUNTER — Encounter (HOSPITAL_COMMUNITY): Payer: Self-pay | Admitting: Emergency Medicine

## 2013-11-15 ENCOUNTER — Emergency Department (HOSPITAL_COMMUNITY)
Admission: EM | Admit: 2013-11-15 | Discharge: 2013-11-15 | Discharge status: Against Medical Advice | Payer: Medicare Other | Attending: Emergency Medicine | Admitting: Emergency Medicine

## 2013-11-15 ENCOUNTER — Ambulatory Visit (HOSPITAL_COMMUNITY): Admission: RE | Admit: 2013-11-15 | Payer: Medicare Other | Source: Ambulatory Visit

## 2013-11-15 DIAGNOSIS — N509 Disorder of male genital organs, unspecified: Secondary | ICD-10-CM | POA: Insufficient documentation

## 2013-11-15 DIAGNOSIS — D649 Anemia, unspecified: Secondary | ICD-10-CM | POA: Insufficient documentation

## 2013-11-15 DIAGNOSIS — N50811 Right testicular pain: Secondary | ICD-10-CM

## 2013-11-15 DIAGNOSIS — M545 Low back pain, unspecified: Secondary | ICD-10-CM | POA: Insufficient documentation

## 2013-11-15 DIAGNOSIS — Z792 Long term (current) use of antibiotics: Secondary | ICD-10-CM | POA: Insufficient documentation

## 2013-11-15 DIAGNOSIS — N50812 Left testicular pain: Secondary | ICD-10-CM

## 2013-11-15 DIAGNOSIS — I120 Hypertensive chronic kidney disease with stage 5 chronic kidney disease or end stage renal disease: Secondary | ICD-10-CM | POA: Insufficient documentation

## 2013-11-15 DIAGNOSIS — Z87828 Personal history of other (healed) physical injury and trauma: Secondary | ICD-10-CM | POA: Insufficient documentation

## 2013-11-15 DIAGNOSIS — Z9861 Coronary angioplasty status: Secondary | ICD-10-CM | POA: Insufficient documentation

## 2013-11-15 DIAGNOSIS — G8929 Other chronic pain: Secondary | ICD-10-CM | POA: Insufficient documentation

## 2013-11-15 DIAGNOSIS — J449 Chronic obstructive pulmonary disease, unspecified: Secondary | ICD-10-CM | POA: Insufficient documentation

## 2013-11-15 DIAGNOSIS — F17200 Nicotine dependence, unspecified, uncomplicated: Secondary | ICD-10-CM | POA: Insufficient documentation

## 2013-11-15 DIAGNOSIS — M542 Cervicalgia: Secondary | ICD-10-CM | POA: Insufficient documentation

## 2013-11-15 DIAGNOSIS — G43909 Migraine, unspecified, not intractable, without status migrainosus: Secondary | ICD-10-CM | POA: Insufficient documentation

## 2013-11-15 DIAGNOSIS — J4489 Other specified chronic obstructive pulmonary disease: Secondary | ICD-10-CM | POA: Insufficient documentation

## 2013-11-15 DIAGNOSIS — F319 Bipolar disorder, unspecified: Secondary | ICD-10-CM | POA: Insufficient documentation

## 2013-11-15 DIAGNOSIS — Z79899 Other long term (current) drug therapy: Secondary | ICD-10-CM | POA: Insufficient documentation

## 2013-11-15 DIAGNOSIS — F209 Schizophrenia, unspecified: Secondary | ICD-10-CM | POA: Insufficient documentation

## 2013-11-15 DIAGNOSIS — G894 Chronic pain syndrome: Secondary | ICD-10-CM | POA: Insufficient documentation

## 2013-11-15 DIAGNOSIS — Z7982 Long term (current) use of aspirin: Secondary | ICD-10-CM | POA: Insufficient documentation

## 2013-11-15 DIAGNOSIS — Z992 Dependence on renal dialysis: Secondary | ICD-10-CM | POA: Insufficient documentation

## 2013-11-15 DIAGNOSIS — N186 End stage renal disease: Secondary | ICD-10-CM | POA: Insufficient documentation

## 2013-11-15 DIAGNOSIS — Z8701 Personal history of pneumonia (recurrent): Secondary | ICD-10-CM | POA: Insufficient documentation

## 2013-11-15 HISTORY — DX: Cervicalgia: M54.2

## 2013-11-15 HISTORY — DX: Other chronic pain: G89.29

## 2013-11-15 HISTORY — DX: Dorsalgia, unspecified: M54.9

## 2013-11-15 MED ORDER — ACETAMINOPHEN 325 MG PO TABS
650.0000 mg | ORAL_TABLET | Freq: Once | ORAL | Status: DC
  Filled 2013-11-15: qty 2

## 2013-11-15 NOTE — ED Notes (Signed)
Pain in testicles on set yesterday and also has pain in lower back
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
## 2013-11-15 NOTE — ED Notes (Signed)
Pt refused to stay for sono, does not want tylenol either. States "I didn't want to come in here anyway, my dad made me come" left AMA.

## 2013-11-15 NOTE — ED Notes (Signed)
Pt left AMA, did sign on EPIC, I witnessed but was unable to sign secondary to computer malfunction. ERMD aware of his departure

## 2013-11-29 ENCOUNTER — Ambulatory Visit: Payer: Medicare Other | Admitting: Adult Health

## 2013-12-06 ENCOUNTER — Encounter: Payer: Self-pay | Admitting: Adult Health

## 2013-12-06 ENCOUNTER — Encounter: Payer: Medicare Other | Admitting: Adult Health
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
## 2013-12-10 ENCOUNTER — Other Ambulatory Visit (HOSPITAL_COMMUNITY): Payer: Self-pay | Admitting: Family Medicine

## 2013-12-10 ENCOUNTER — Ambulatory Visit (HOSPITAL_COMMUNITY)
Admission: RE | Admit: 2013-12-10 | Discharge: 2013-12-10 | Disposition: A | Discharge status: Home / Self Care | Payer: Medicare Other | Source: Ambulatory Visit | Attending: Family Medicine | Admitting: Family Medicine

## 2013-12-10 DIAGNOSIS — W19XXXA Unspecified fall, initial encounter: Secondary | ICD-10-CM

## 2013-12-10 DIAGNOSIS — M25569 Pain in unspecified knee: Secondary | ICD-10-CM | POA: Insufficient documentation

## 2013-12-10 DIAGNOSIS — M25469 Effusion, unspecified knee: Secondary | ICD-10-CM | POA: Insufficient documentation

## 2013-12-19 IMAGING — CR DG LUMBAR SPINE COMPLETE 4+V
5 series · 5 of 5 positions shown · non-contrast
Comparison: 04/25/2012 and earlier.

CLINICAL DATA: 35-year-old male with low back pain status post
fall.  End-stage renal disease.

LUMBAR SPINE - COMPLETE 4+ VIEW

[view not recorded (1 of 5)]
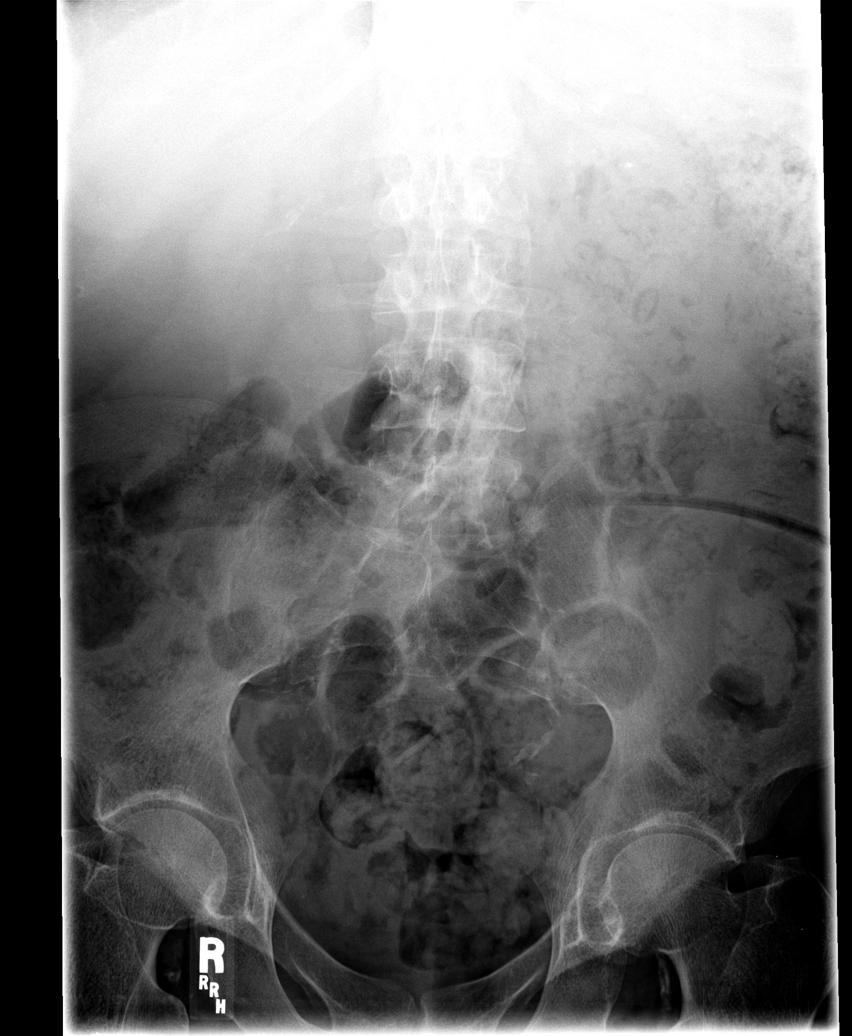

[view not recorded (2 of 5)]
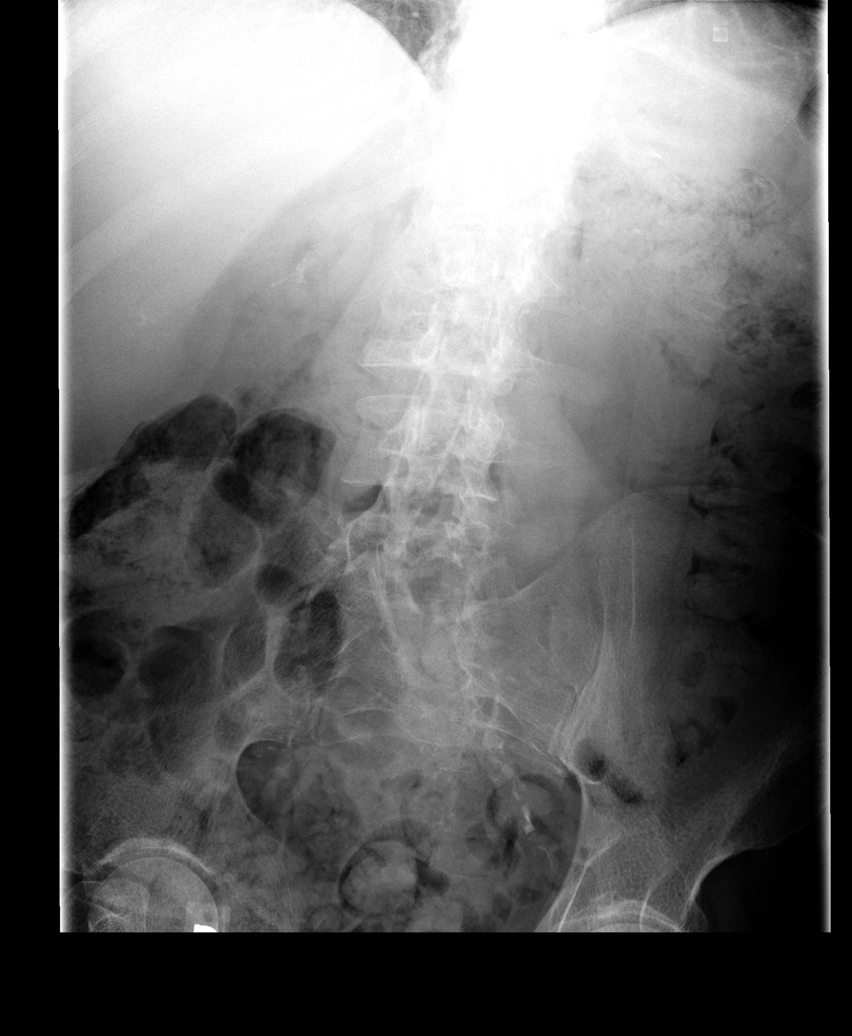

[view not recorded (3 of 5)]
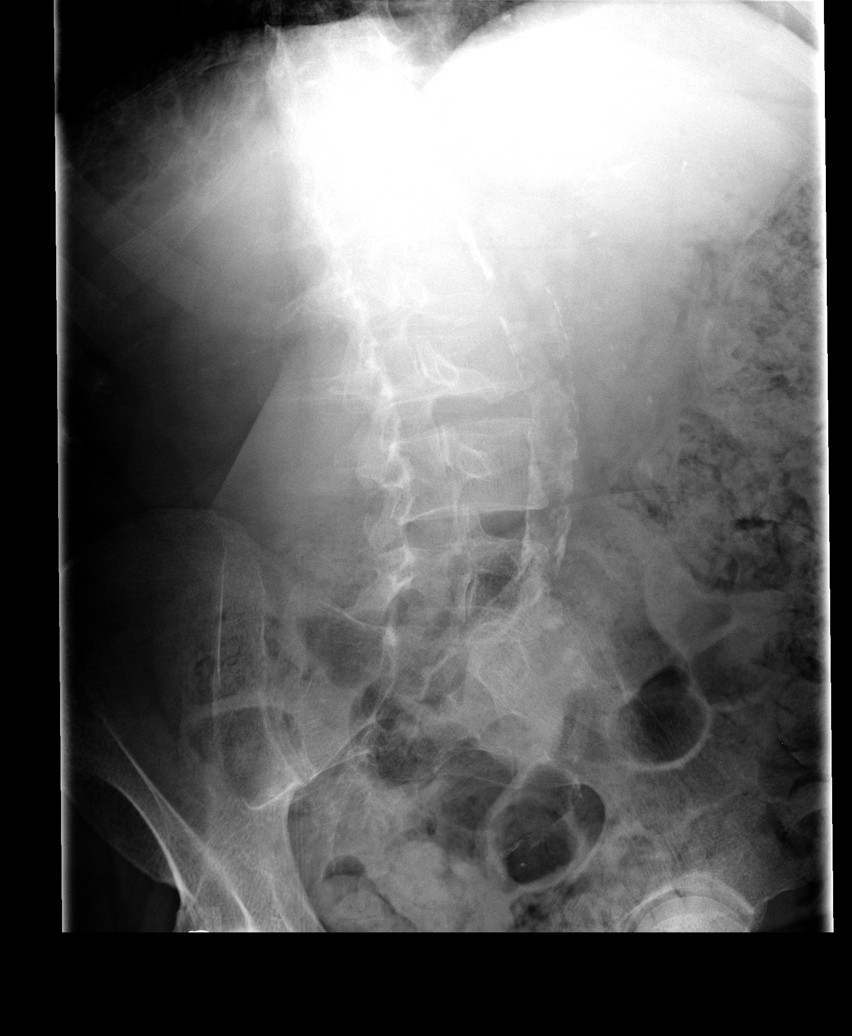

[view not recorded (4 of 5)]
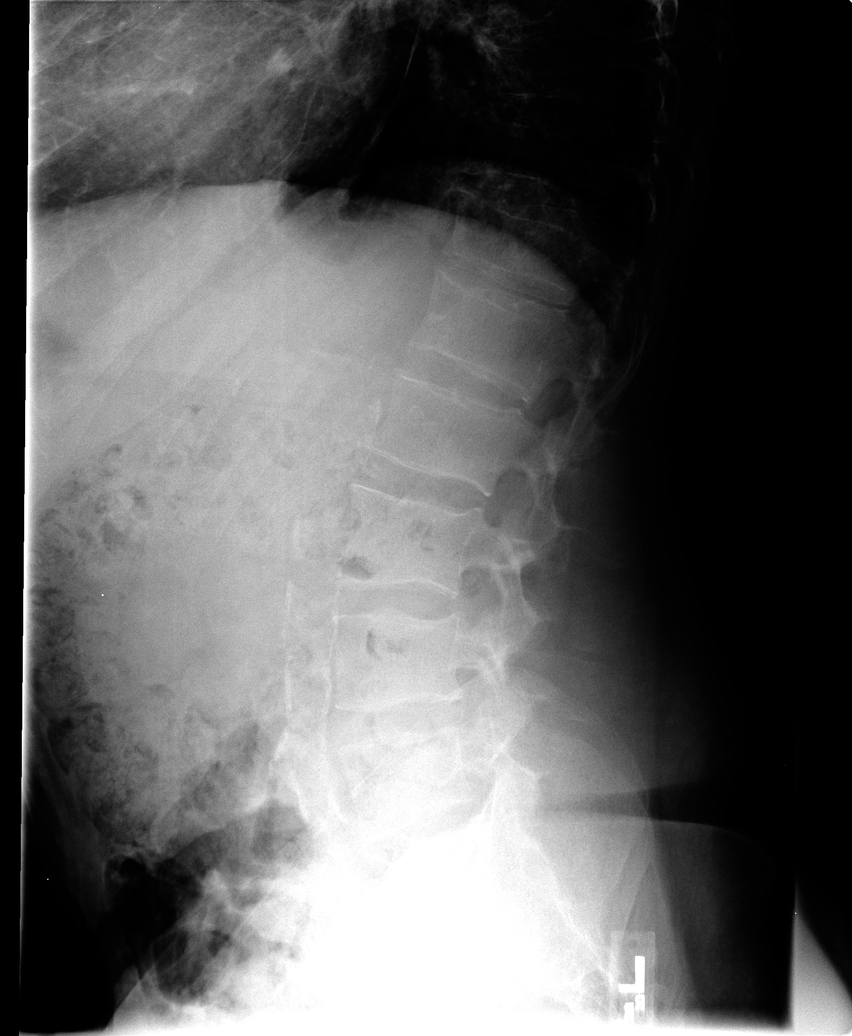

[view not recorded (5 of 5)]
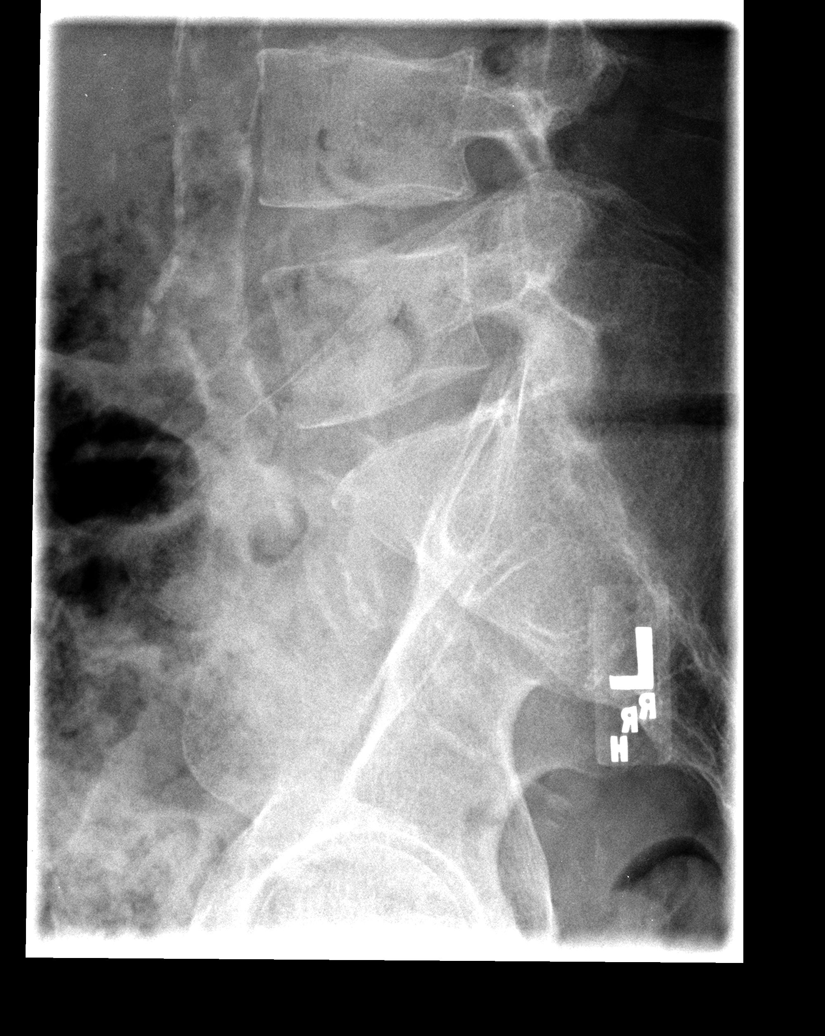

[5 of 5 positions shown; findings below may reference images not displayed]

FINDINGS: Stable vertebral height and alignment, including lower
thoracic and L1 level wedging.  Stable bone mineralization.
Relatively preserved disc spaces.  No endplate destruction.  No
pars fracture.  Extensive calcified atherosclerosis.
IMPRESSION: No acute osseous abnormality in the lumbar spine.  Sequelae of end-
stage renal disease.

## 2013-12-31 ENCOUNTER — Other Ambulatory Visit: Payer: Self-pay | Admitting: Gastroenterology

## 2014-01-04 IMAGING — CT CT HEAD W/O CM
1 of 2 series · 13 of 30 positions shown, 17 images · non-contrast
Comparison: 07/18/2010

CLINICAL DATA: Headache

CT HEAD WITHOUT CONTRAST
TECHNIQUE: Contiguous axial images were obtained from the base of
the skull through the vertex without contrast.

[Series 2: headseq 4.8 h37s · axial · 0.43mm/px · z∈[+1085,+1262]mm · 13 of 42 slices shown, 17 images]
[im 3/42  brain]
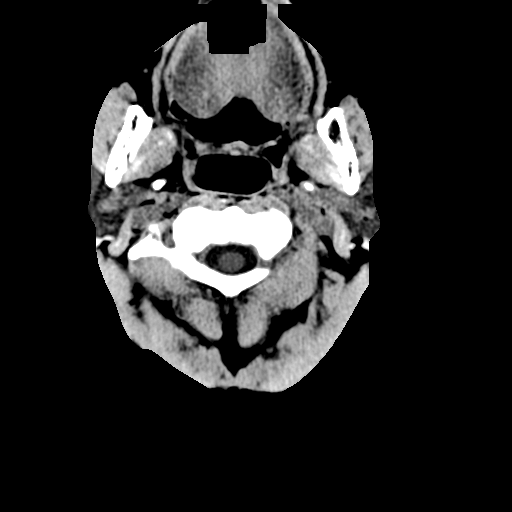
[im 3/42  bone]
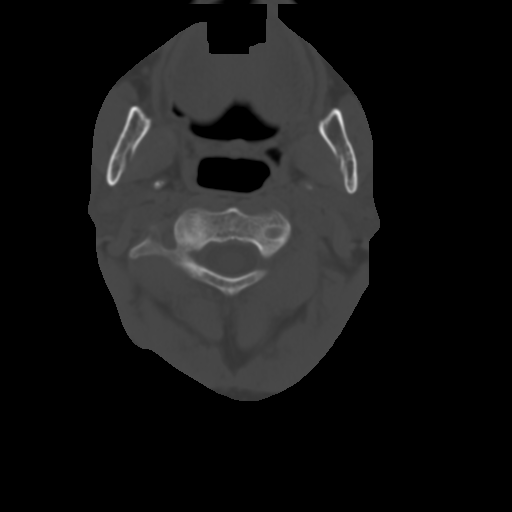
[im 6/42  brain]
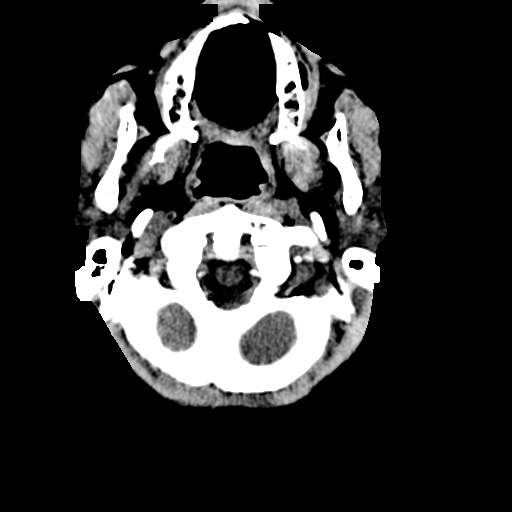
[im 9/42  brain]
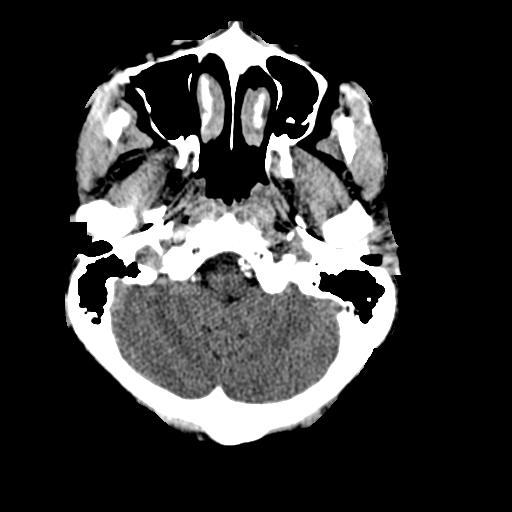
[im 12/42  brain]
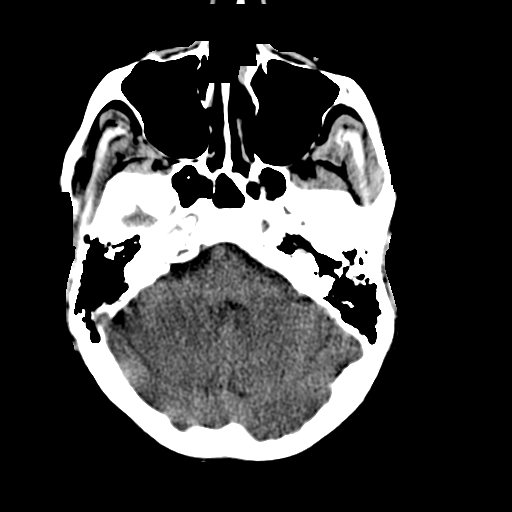
[im 15/42  brain]
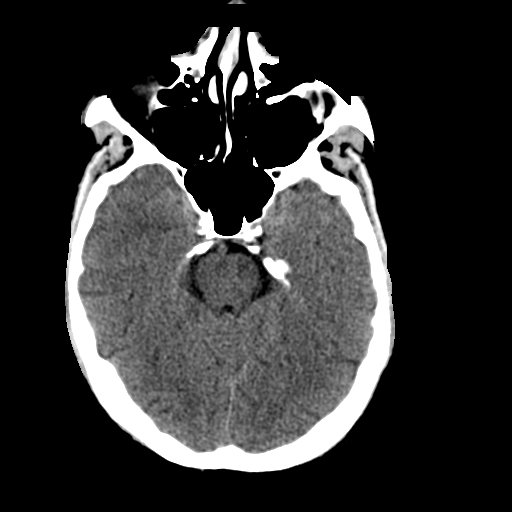
[im 15/42  bone]
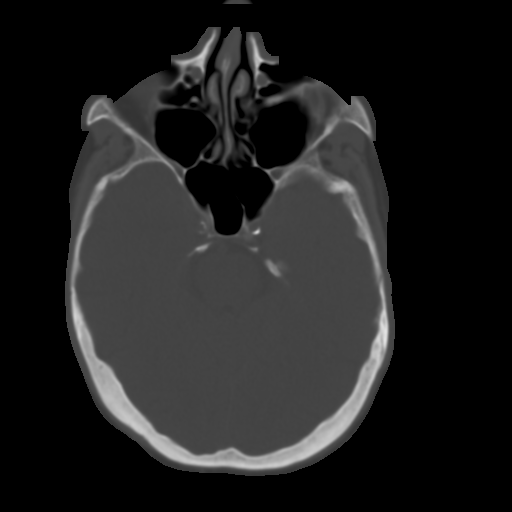
[im 18/42  brain]
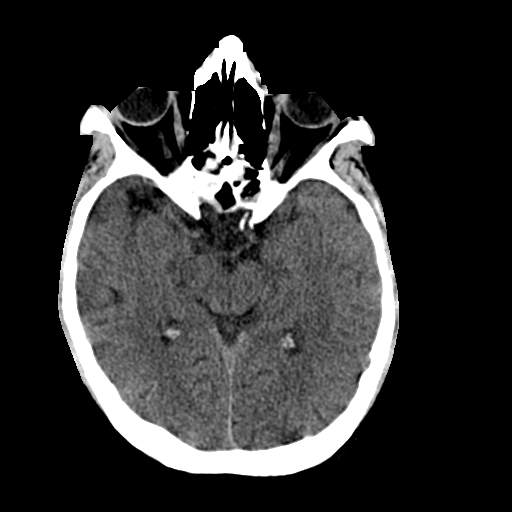
[im 21/42  brain]
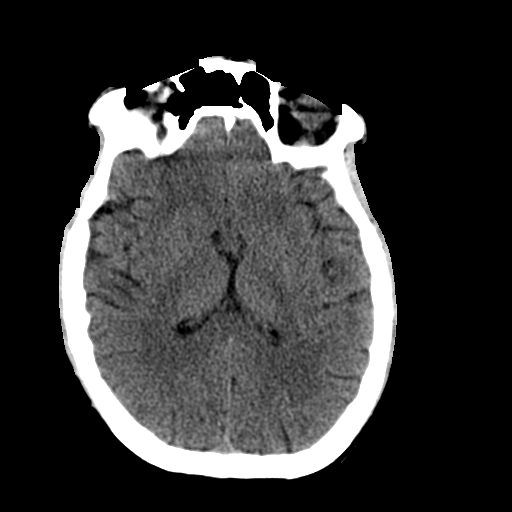
[im 24/42  brain]
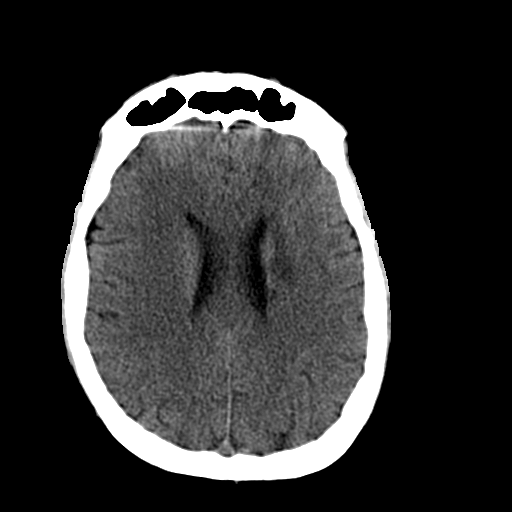
[im 27/42  brain]
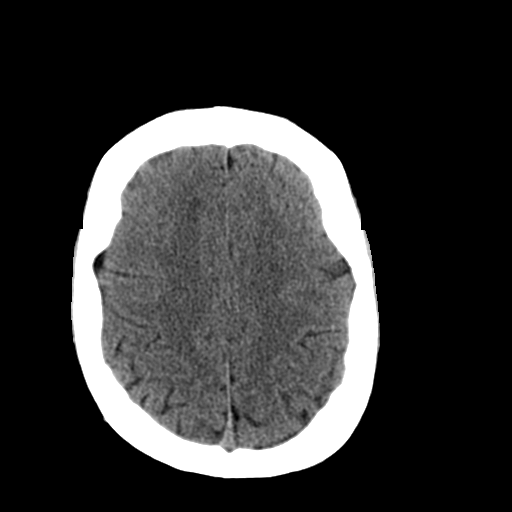
[im 27/42  bone]
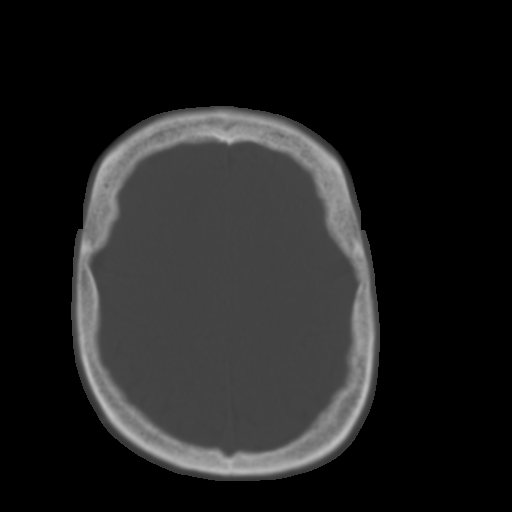
[im 30/42  brain]
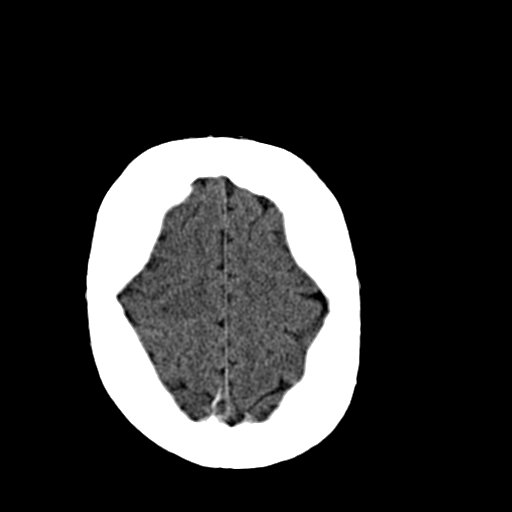
[im 33/42  brain]
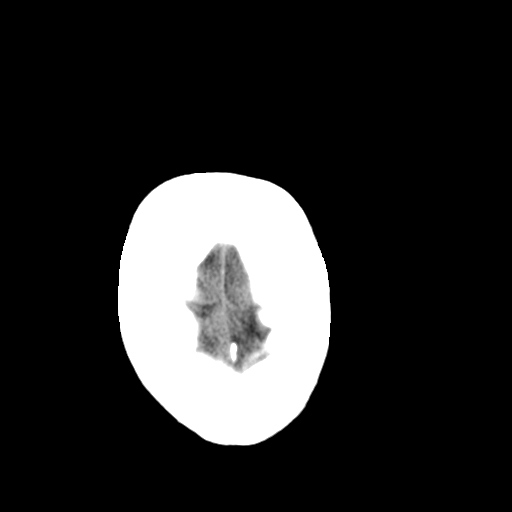
[im 36/42  brain]
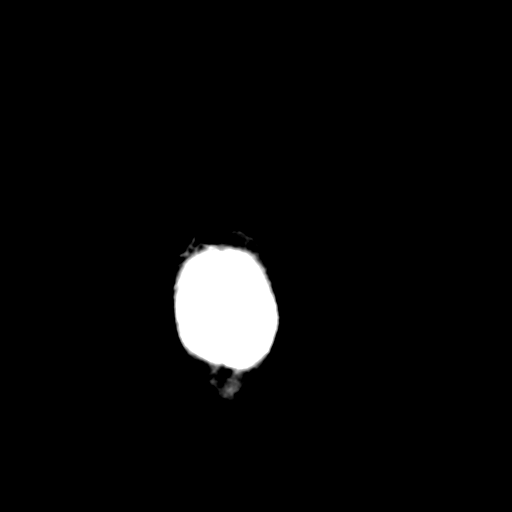
[im 39/42  brain]
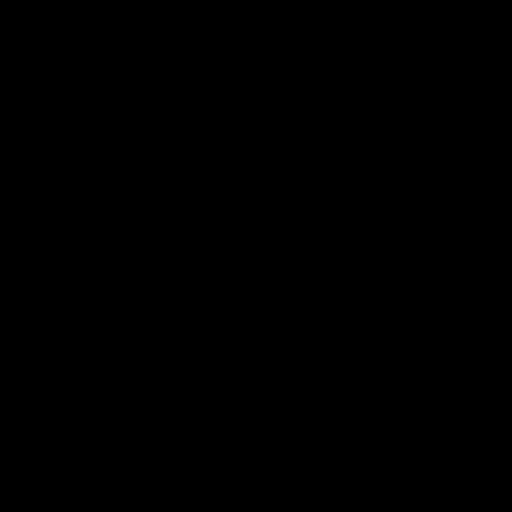
[im 39/42  bone]
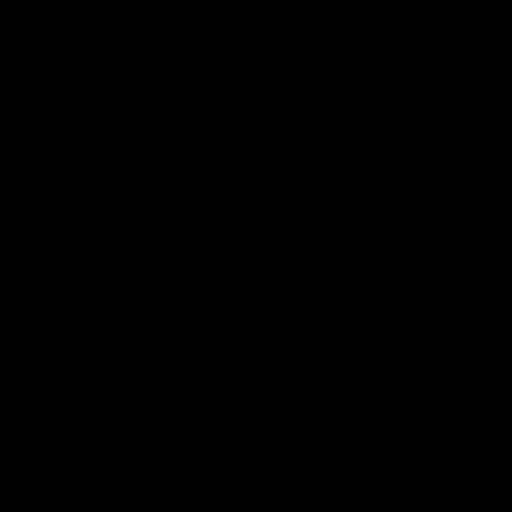

[13 of 30 positions shown; findings below may reference images not displayed]

FINDINGS: No skull fracture is noted.
Atherosclerotic calcifications of the carotid siphon.  Mild mucosal
thickening anterior aspect right sphenoid sinus.

No intracranial hemorrhage, mass effect or midline shift.  Stable
old infarct in the anterior limb of the left internal capsule.  No
acute cortical infarction.  No mass lesion is noted on this
unenhanced scan.
IMPRESSION: 1.  No acute intracranial abnormality.  Stable old infarct in
anterior limb of the left internal capsule.  Mild mucosal
thickening right sphenoid sinus.

## 2014-01-05 IMAGING — CT CT ABD-PELV W/O CM
3 of 4 series · 8 of 46 positions shown, 15 images · non-contrast
Comparison: CT abdomen 06/11/2011

CLINICAL DATA: Right upper quadrant pain, end-stage renal disease
on dialysis.

CT ABDOMEN AND PELVIS WITHOUT CONTRAST
TECHNIQUE: Multidetector CT imaging of the abdomen and pelvis was
performed following the standard protocol without intravenous
contrast.

[Series 3: lung 5.0 b60f · axial · 0.76mm/px · z∈[-75,-25]mm · 4 of 18 slices shown, 9 images]
[im 4/18  soft-tissue]
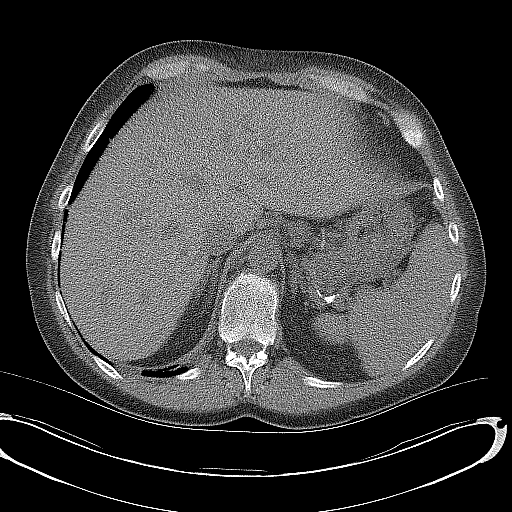
[im 4/18  lung]
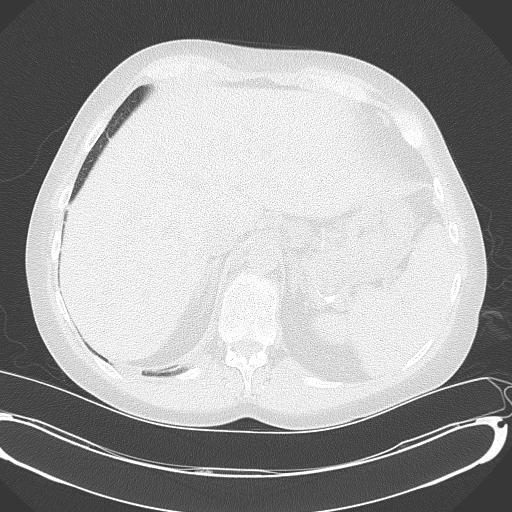
[im 4/18  bone]
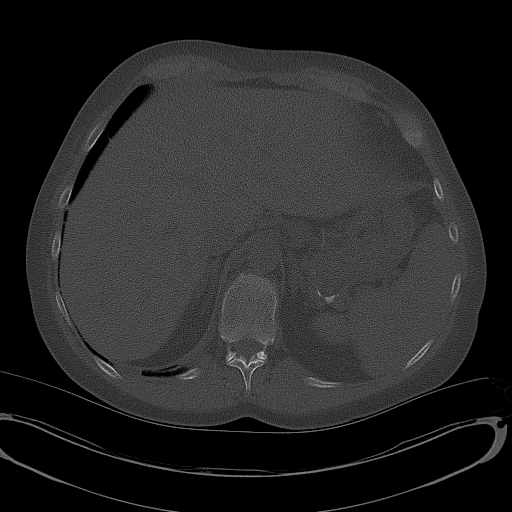
[im 7/18  soft-tissue]
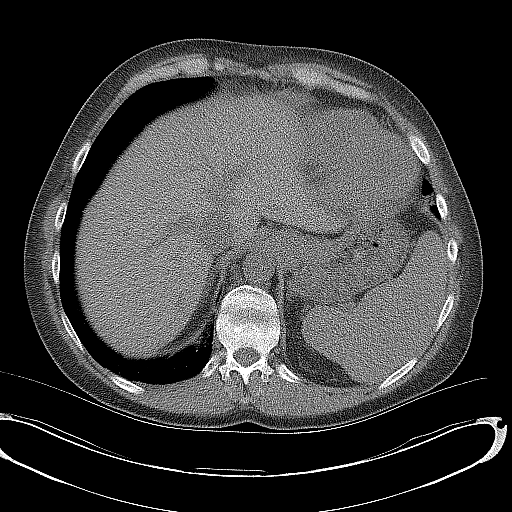
[im 7/18  lung]
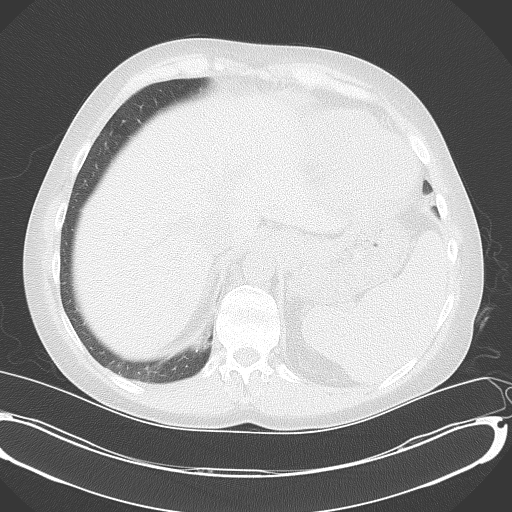
[im 11/18  soft-tissue]
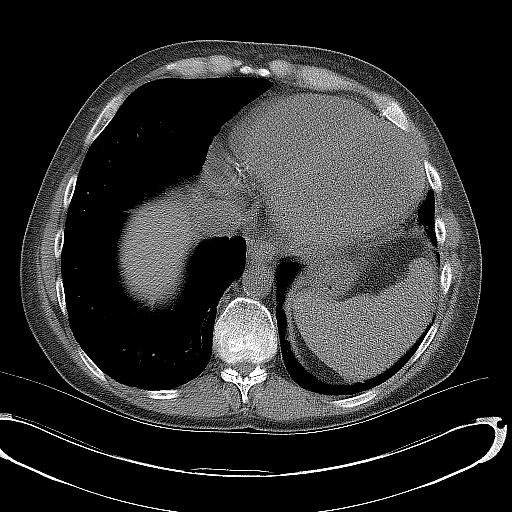
[im 11/18  lung]
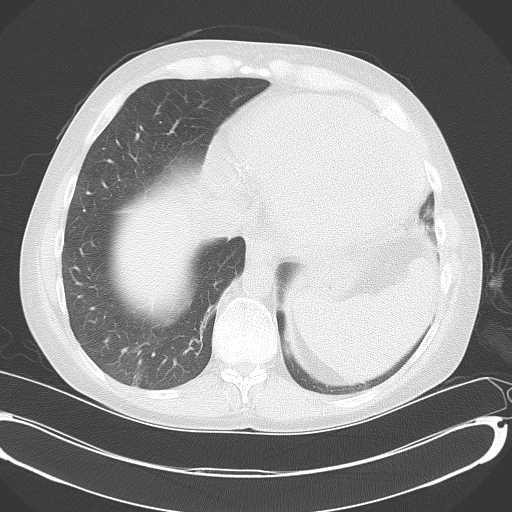
[im 14/18  soft-tissue]
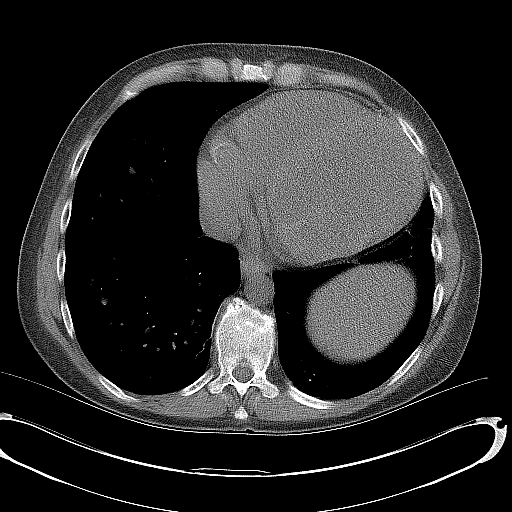
[im 14/18  lung]
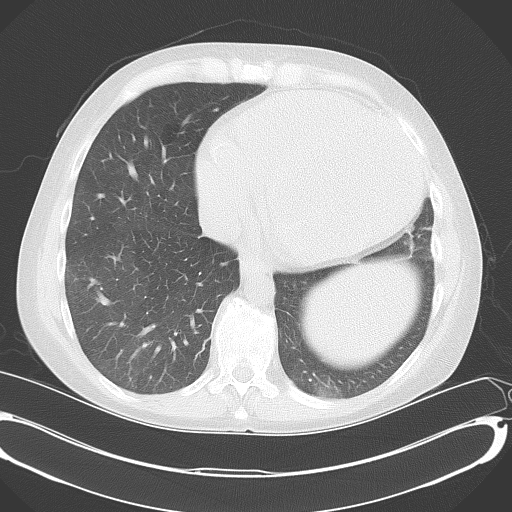

[Series 4: mpr coronal (id) · coronal · 0.75mm/px · 3 of 91 slices shown, 4 images]
[im 31/91  soft-tissue]
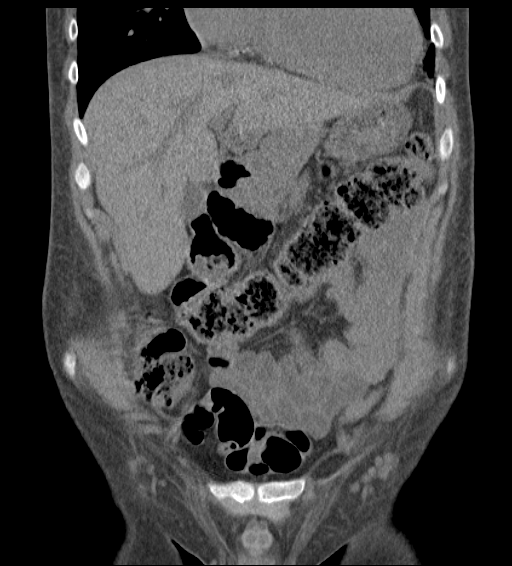
[im 41/91  soft-tissue]
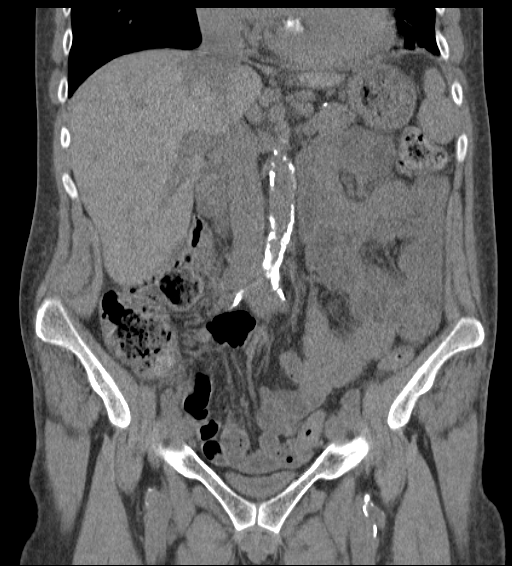
[im 41/91  bone]
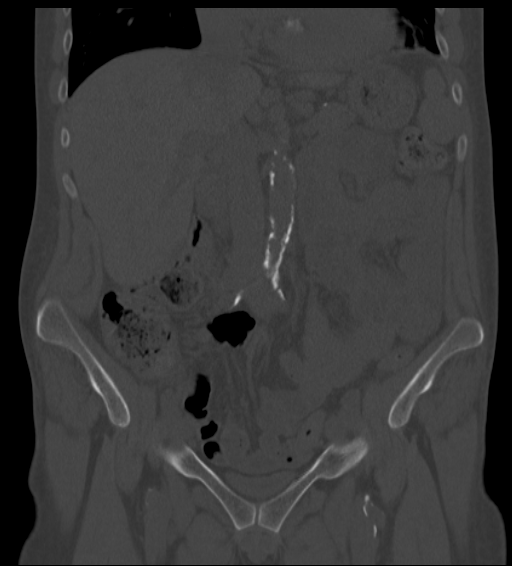
[im 51/91  soft-tissue]
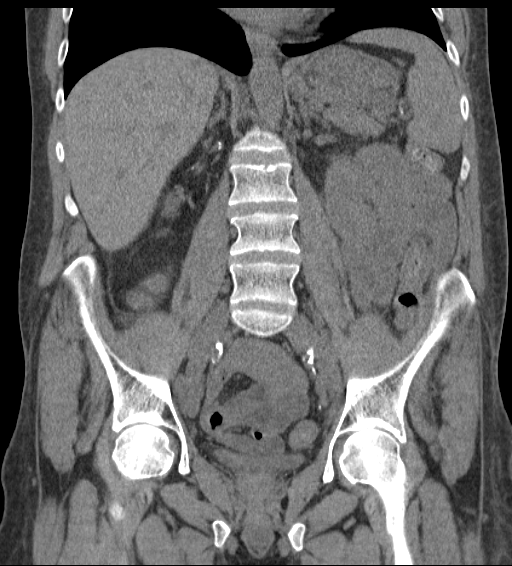

[Series 5: mpr sagittal (id) · sagittal · 0.56mm/px · 1 of 123 slices shown, 2 images]
[im 41/123  soft-tissue]
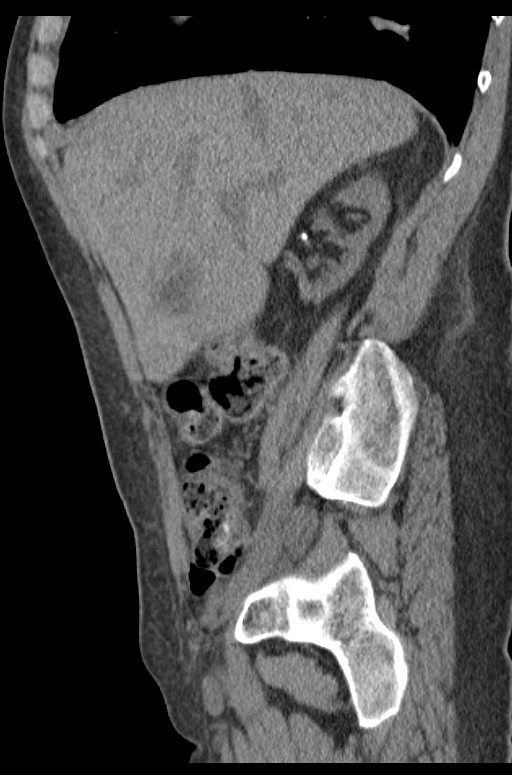
[im 41/123  bone]
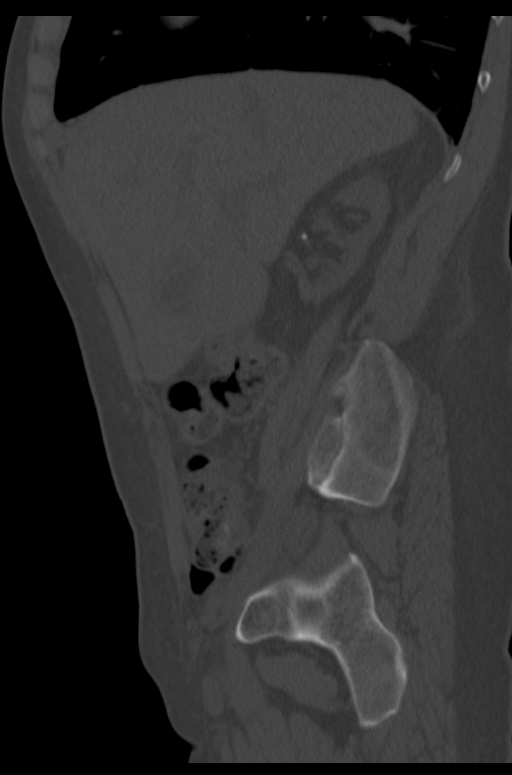

[8 of 46 positions shown; findings below may reference images not displayed]

FINDINGS: Lung bases are clear.  No pericardial fluid.  Heart is
enlarged.

No focal hepatic lesion on this noncontrast exam.  Gallbladder,
pancreas, spleen, and adrenal glands normal.  The kidneys are
atrophic.  Several low density cysts extending from the left kidney
which are not changed from prior.

The stomach, small bowel, cecum are normal.  The colon and
rectosigmoid colon are normal.

Abdominal aorta is heavily calcified but nonaneurysmal.  No
retroperitoneal periportal lymphadenopathy

No free fluid the abdomen or pelvis.

The prostate gland and bladder are unremarkable.  No pelvic
lymphadenopathy. Review of  bone windows demonstrates no aggressive
osseous lesions.
IMPRESSION: 1..  No acute abdominal or pelvic findings on this noncontrast
exam.
2.  Cardiomegaly.
3.  Atherosclerotic disease.

4.  Atrophic kidneys.

## 2014-01-07 ENCOUNTER — Ambulatory Visit (INDEPENDENT_AMBULATORY_CARE_PROVIDER_SITE_OTHER): Payer: Medicare Other | Admitting: Orthopedic Surgery

## 2014-01-07 VITALS — BP 137/83 | Ht 68.0 in | Wt 195.0 lb

## 2014-01-07 DIAGNOSIS — S8000XA Contusion of unspecified knee, initial encounter: Secondary | ICD-10-CM

## 2014-01-07 DIAGNOSIS — S8001XA Contusion of right knee, initial encounter: Secondary | ICD-10-CM | POA: Insufficient documentation

## 2014-01-07 MED ORDER — HYDROCODONE-ACETAMINOPHEN 5-325 MG PO TABS: 1.0000 | ORAL_TABLET | Freq: Four times a day (QID) | ORAL | Status: AC | PRN

## 2014-01-07 NOTE — Progress Notes (Signed)
Patient ID: Mosetta Anisimothy L Washabaugh, male   DOB: 10/31/1976, 38 y.o.   MRN: [id number] Chief Complaint  Patient presents with  . Knee Pain    Right knee pain and giving out   HISTORY: 38 year old male on dialysis history of bilateral knee pain and osteoarthritis hit his knee against a corner of a table complains of right anterior knee pain. Again has a chronic osteoarthritis of both knees. Complains of sharp stabbing 9/10 constant pain. He would like some Percocet for his pain  His review of systems is positive in all parameters including numbness tingling nervousness anxiety depression joint pain and swelling shortness of breath and chest pain  He has acute history of chronic back pain depression he has a fistula for his dialysis treatments is a family history of heart disease he is a smoker a pack a day  He essentially is oriented x3 mood is normal he has mild obesity his gait is labored he has valgus knees he has tenderness in the front of his knee as well as the joint lines. Primary tenderness over the patellar tendon and the patella itself knee is otherwise stable including collateral ligaments skin shows an abrasion which is healed over the knee he has peripheral edema  Contusion right knee  The dialysis prevents any knee surgery at this point this can be treated with simple measures and local measures. He was given Norco for pain no further treatment is necessary no visits needed for followup.

## 2014-01-07 NOTE — Patient Instructions (Signed)
Biofreeze or Maxfreeze 3 x a day   Heat 3 x a day

## 2014-01-10 IMAGING — CT CT HEAD W/O CM
1 series · 16 of 30 positions shown, 20 images · non-contrast
Comparison: 06/21/2012

CLINICAL DATA: Headache

CT HEAD WITHOUT CONTRAST
TECHNIQUE: Contiguous axial images were obtained from the base of
the skull through the vertex without contrast.

[Series 2: headseq 4.8 h37s · axial · 0.46mm/px · z∈[+85,+227]mm · 16 of 30 slices shown, 20 images]
[im 2/30  brain]
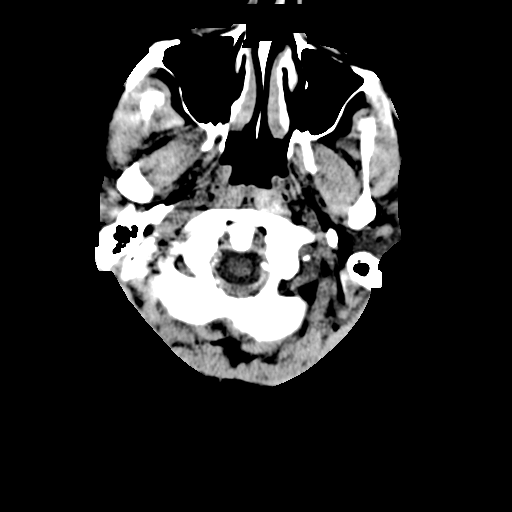
[im 2/30  bone]
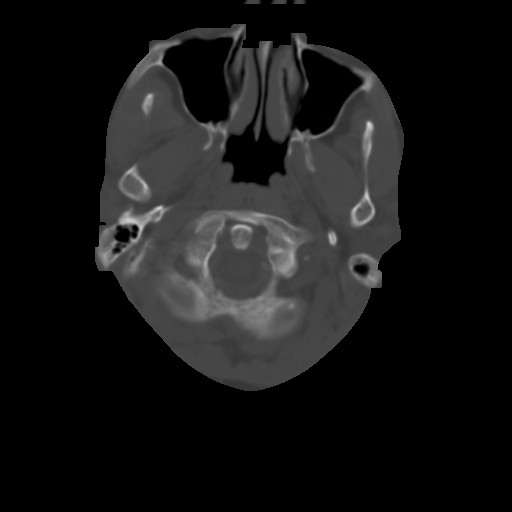
[im 4/30  brain]
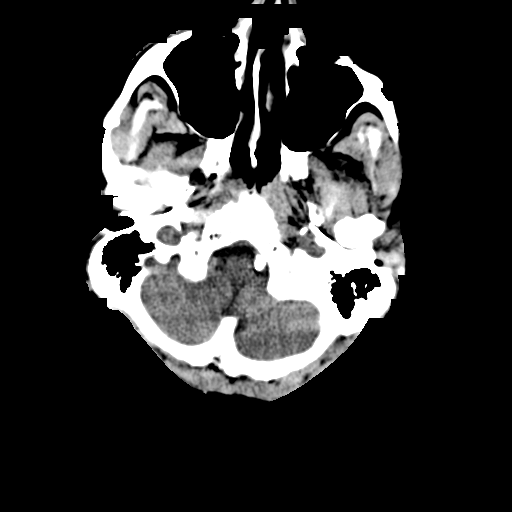
[im 6/30  brain]
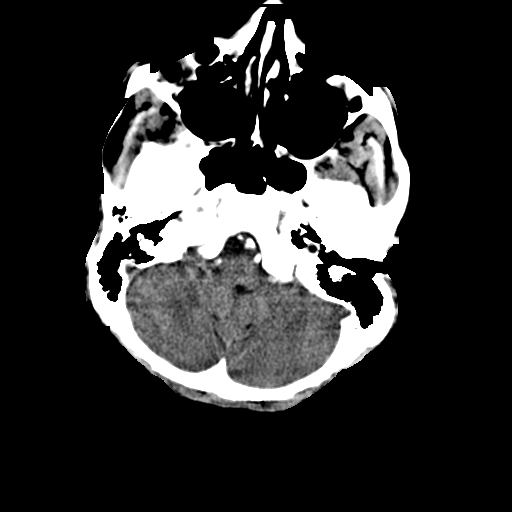
[im 8/30  brain]
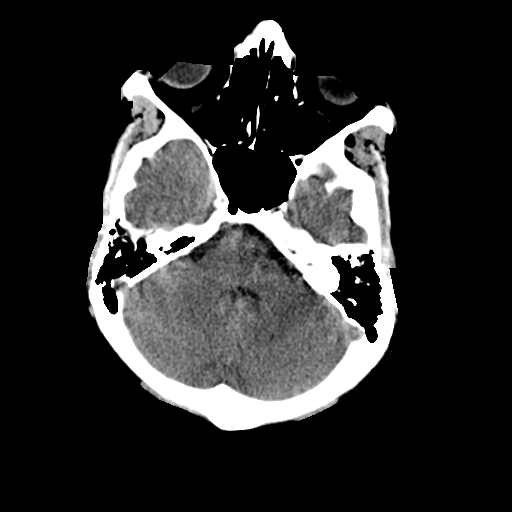
[im 9/30  brain]
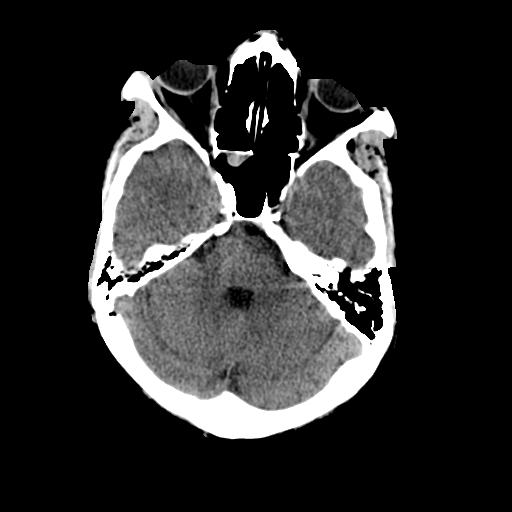
[im 9/30  bone]
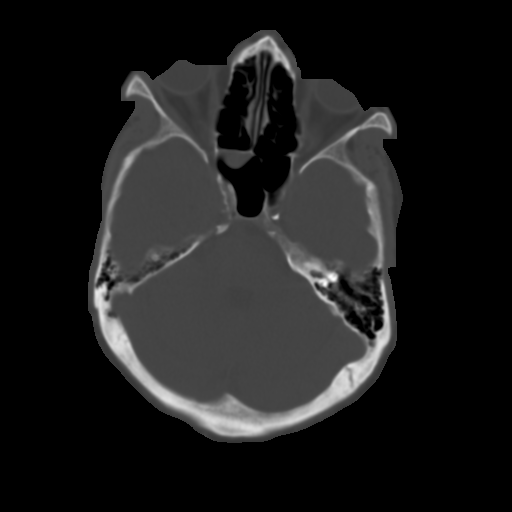
[im 11/30  brain]
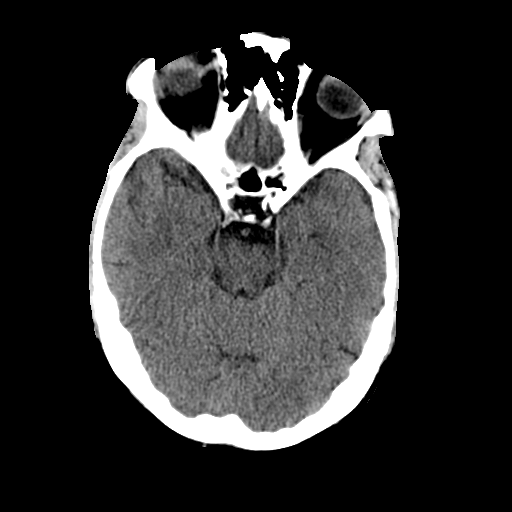
[im 13/30  brain]
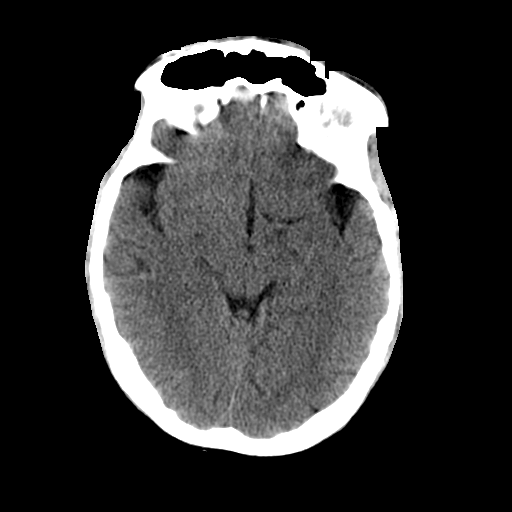
[im 15/30  brain]
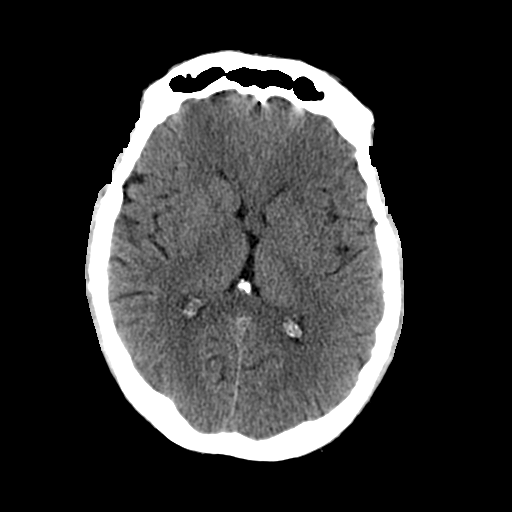
[im 16/30  brain]
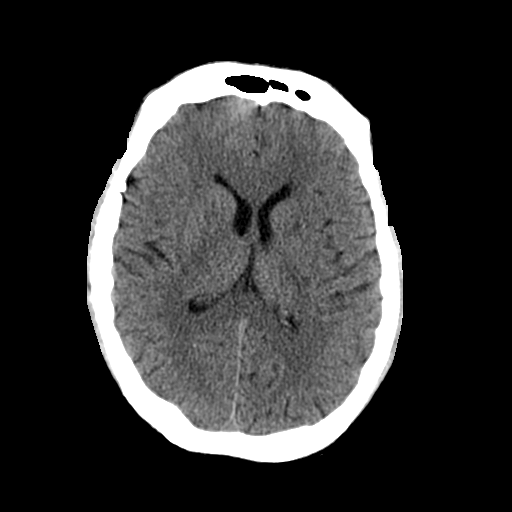
[im 16/30  bone]
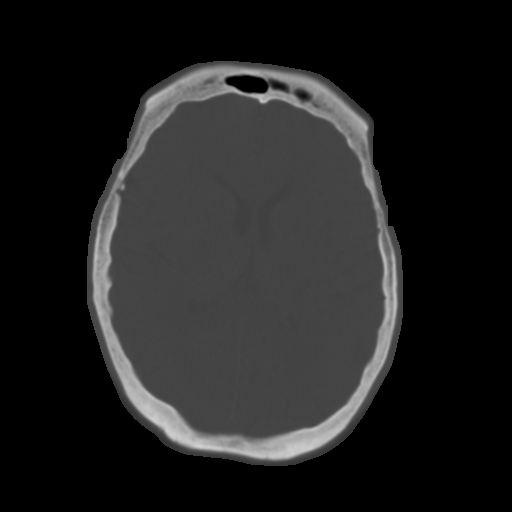
[im 18/30  brain]
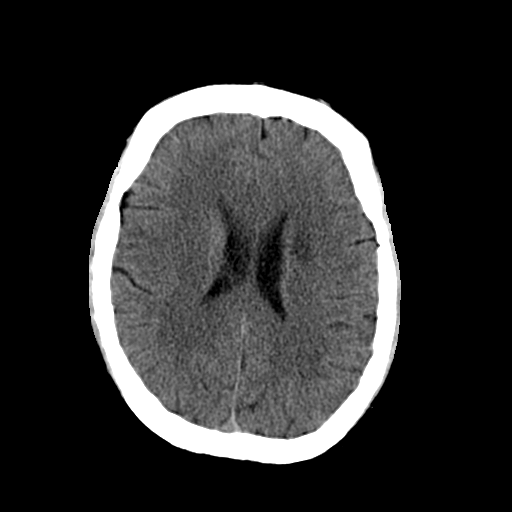
[im 20/30  brain]
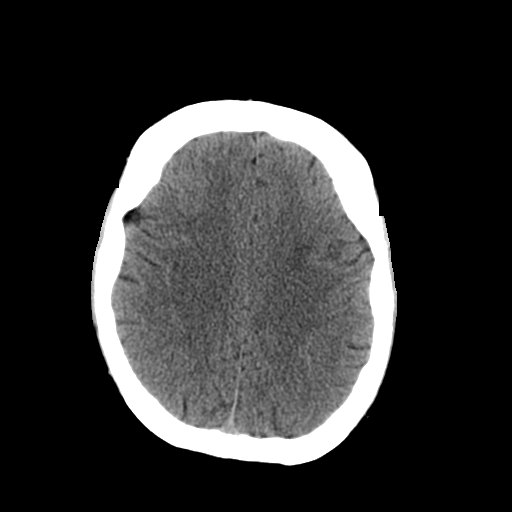
[im 22/30  brain]
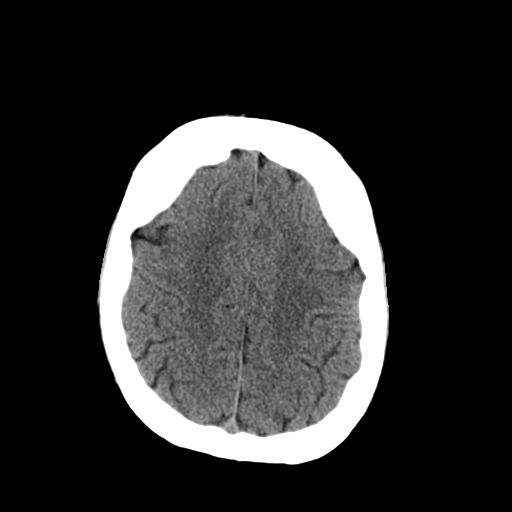
[im 23/30  brain]
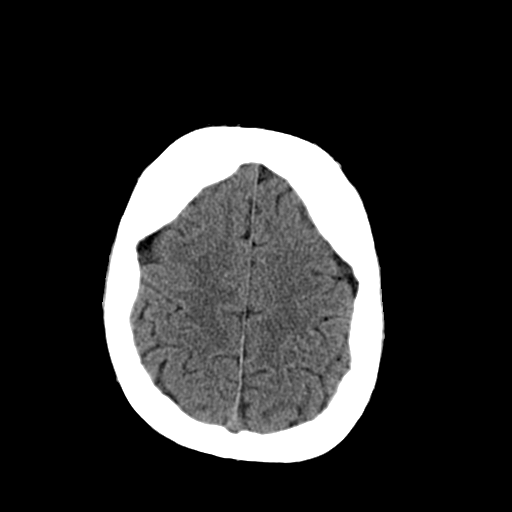
[im 23/30  bone]
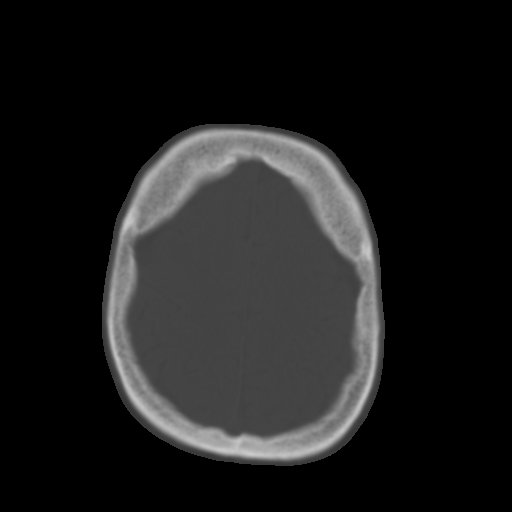
[im 25/30  brain]
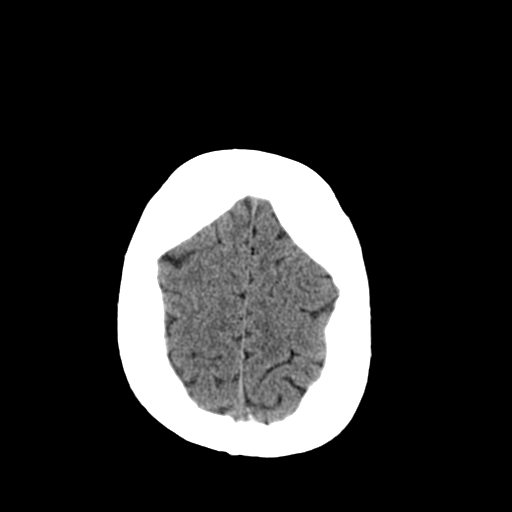
[im 27/30  brain]
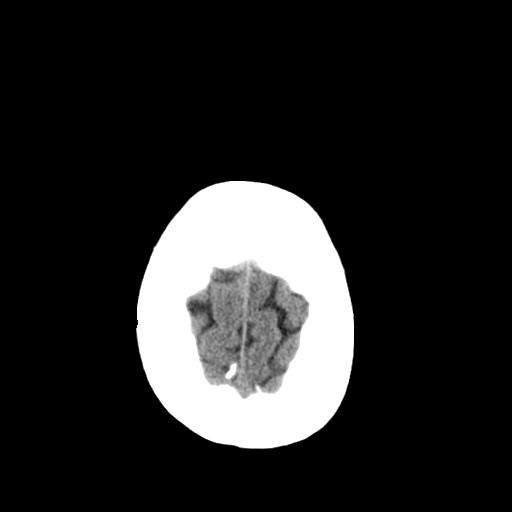
[im 29/30  brain]
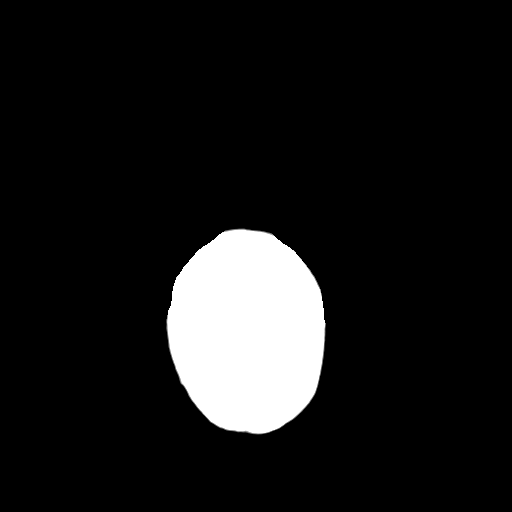

[16 of 30 positions shown; findings below may reference images not displayed]

FINDINGS: There is no evidence for acute hemorrhage, hydrocephalus,
mass lesion, or abnormal extra-axial fluid collection.  No definite
CT evidence for acute infarction.  Stable appearance of old infarct
involving the anterior limb of the left internal capsule. The
visualized paranasal sinuses and mastoid air cells are clear.
IMPRESSION: Stable.  No acute intracranial abnormality.

## 2014-01-20 IMAGING — CR DG ABDOMEN ACUTE W/ 1V CHEST
3 series · 3 of 3 positions shown · non-contrast
Comparison: Chest radiographs dated 03/01/2012.  CT abdomen pelvis
dated 06/22/2012.

CLINICAL DATA: Abdominal pain, nausea/vomiting

ACUTE ABDOMEN SERIES (ABDOMEN 2 VIEW & CHEST 1 VIEW)

[view not recorded (1 of 3)]
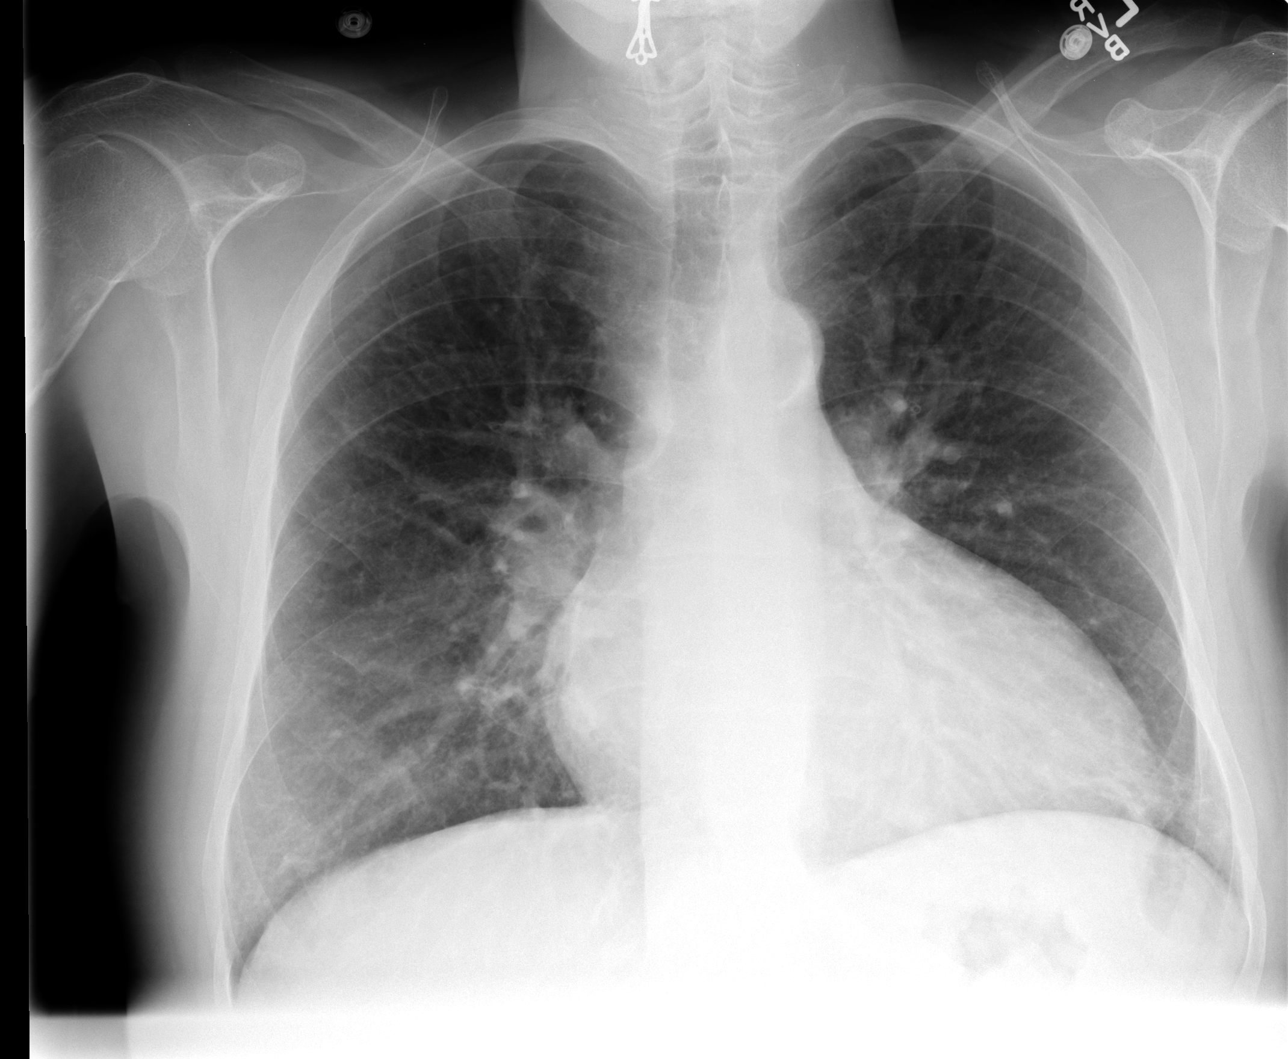

[view not recorded (2 of 3)]
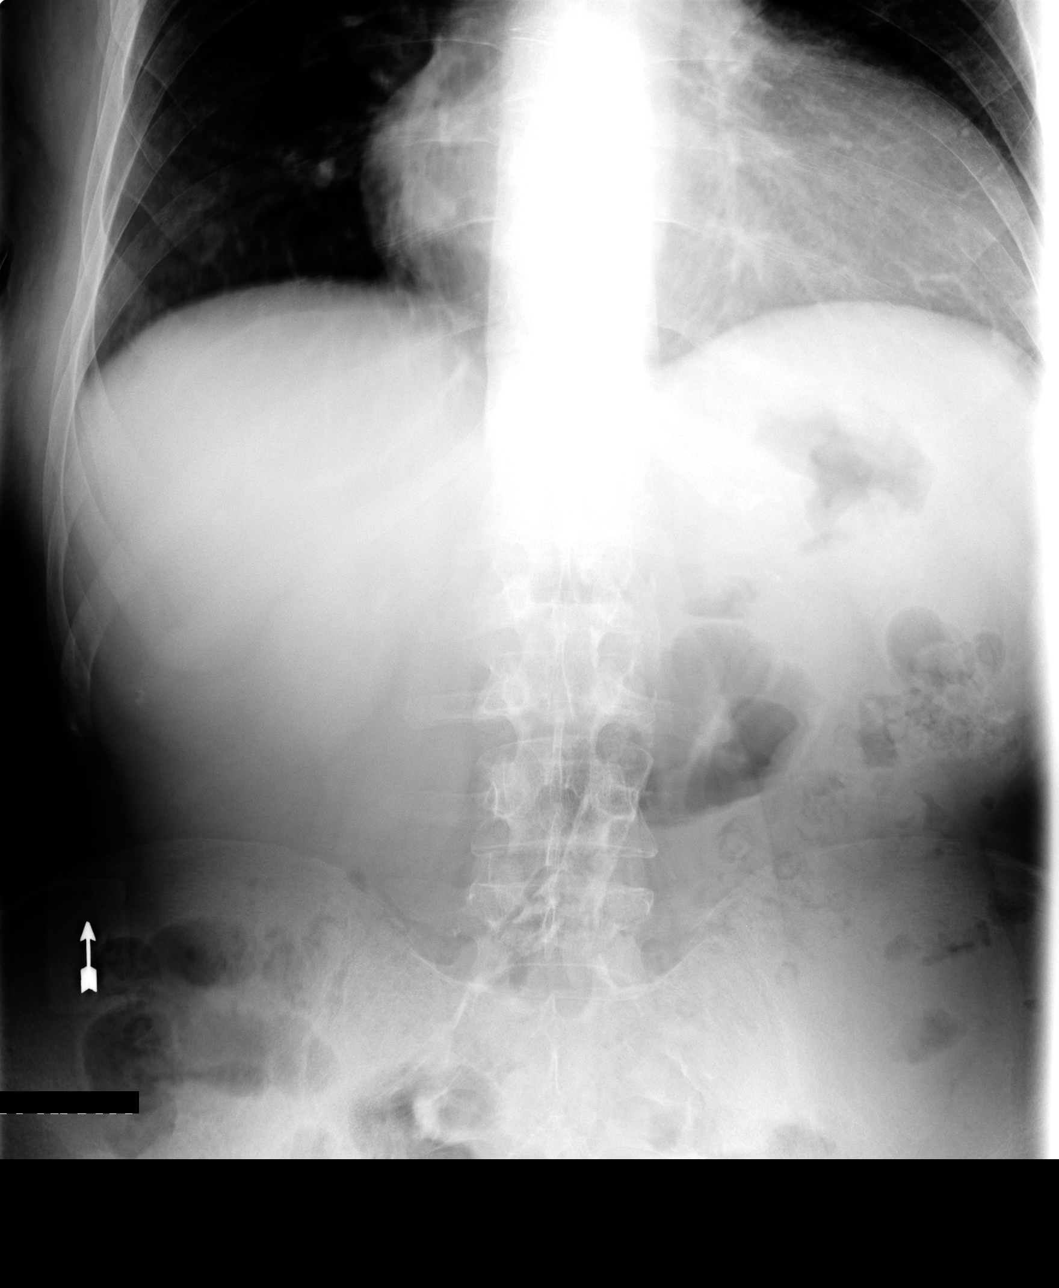

[view not recorded (3 of 3)]
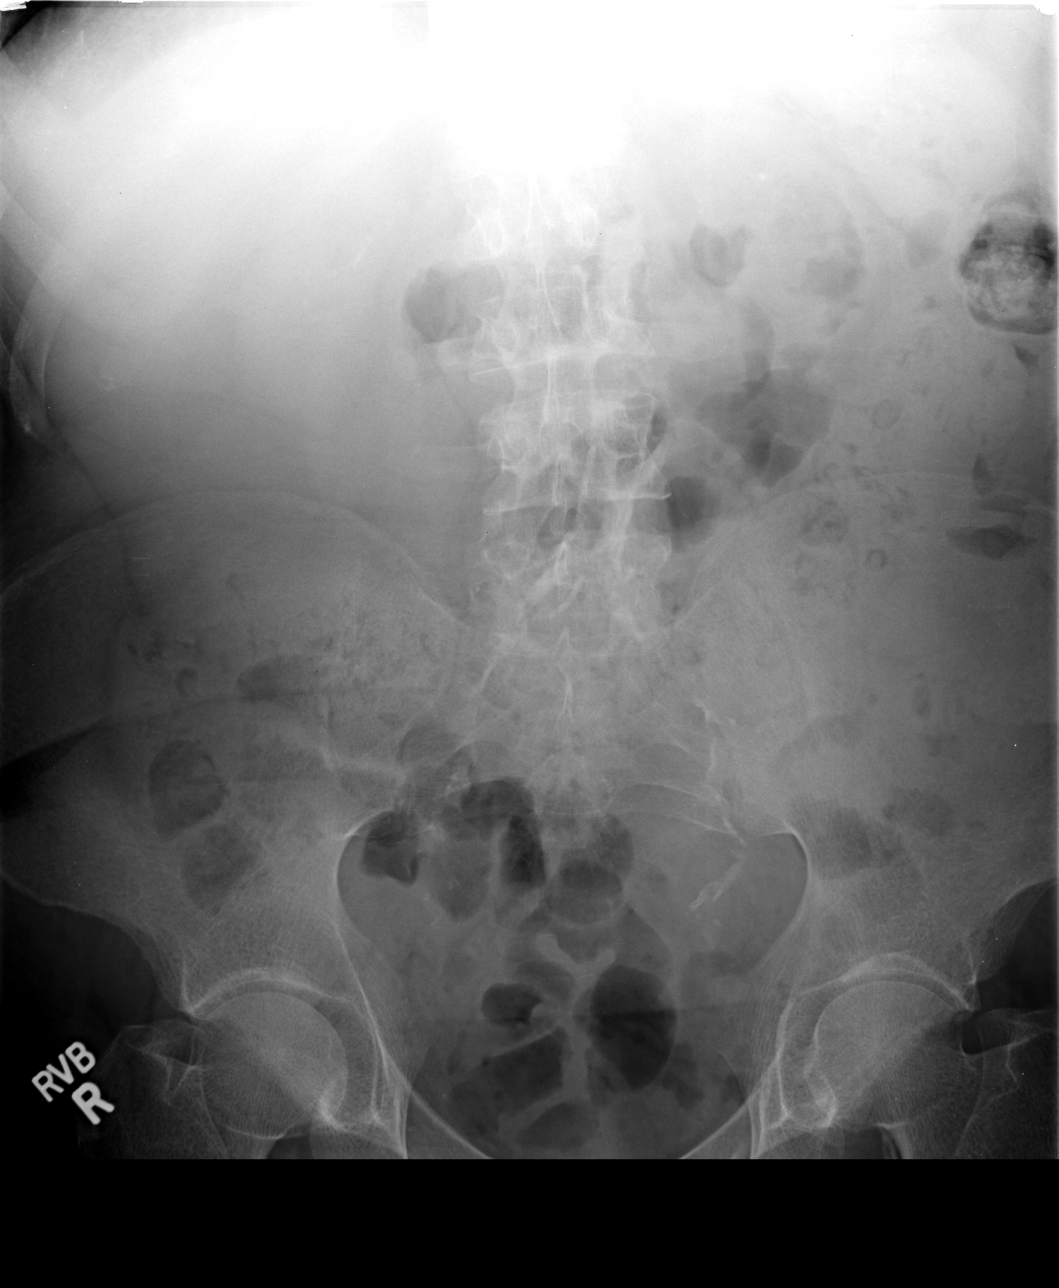

[3 of 3 positions shown; findings below may reference images not displayed]

FINDINGS: Patchy lateral left lower lobe opacity, likely
atelectasis or scarring. No pleural effusion or pneumothorax.

Stable cardiomegaly.

Nonspecific bowel gas pattern without disproportionate small bowel
dilatation to suggest small bowel obstruction.

No evidence of free air under the diaphragm on the upright view.

Vascular calcifications.
IMPRESSION: Patchy lateral left lower lobe opacity, likely atelectasis or
scarring.  Stable cardiomegaly.

No findings to suggest small bowel obstruction.  No evidence of
free air.

## 2014-06-02 ENCOUNTER — Other Ambulatory Visit: Payer: Self-pay | Admitting: *Deleted

## 2014-06-02 DIAGNOSIS — N186 End stage renal disease: Secondary | ICD-10-CM

## 2014-06-02 DIAGNOSIS — Z01810 Encounter for preprocedural cardiovascular examination: Secondary | ICD-10-CM

## 2014-06-11 ENCOUNTER — Ambulatory Visit: Payer: Medicare Other | Admitting: Vascular Surgery

## 2014-06-11 ENCOUNTER — Encounter (HOSPITAL_COMMUNITY): Payer: Medicare Other

## 2014-06-11 ENCOUNTER — Other Ambulatory Visit (HOSPITAL_COMMUNITY): Payer: Medicare Other

## 2014-06-27 IMAGING — CR DG KNEE COMPLETE 4+V*L*
4 series · 4 of 4 positions shown · non-contrast
Comparison: 12/03/2011

CLINICAL DATA: Pain for 2 days, no known injury

LEFT KNEE - COMPLETE 4+ VIEW

[view not recorded (1 of 4)]
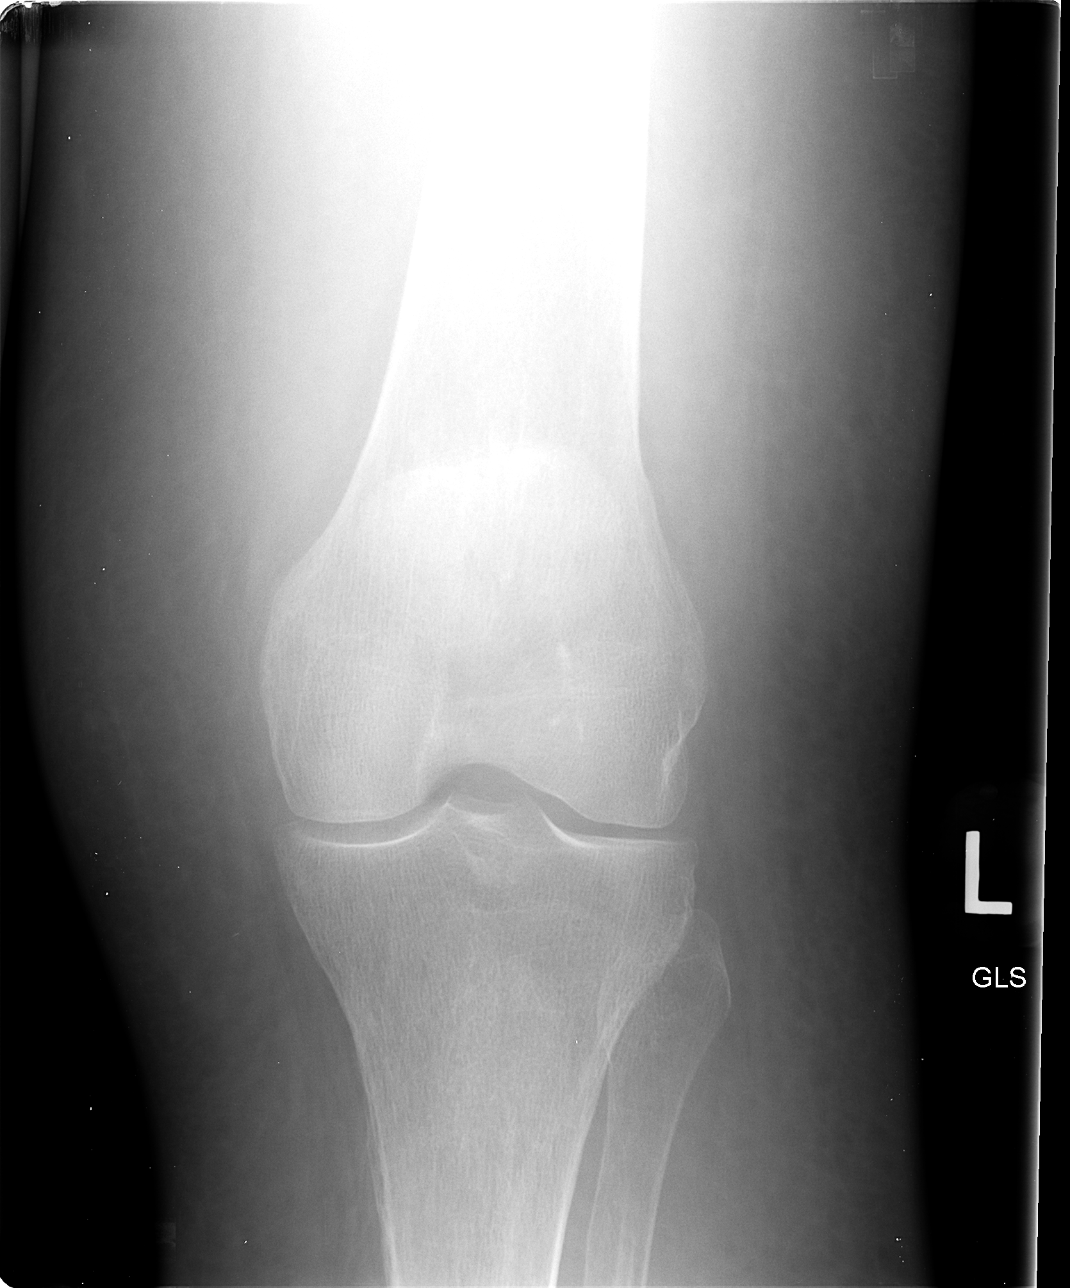

[view not recorded (2 of 4)]
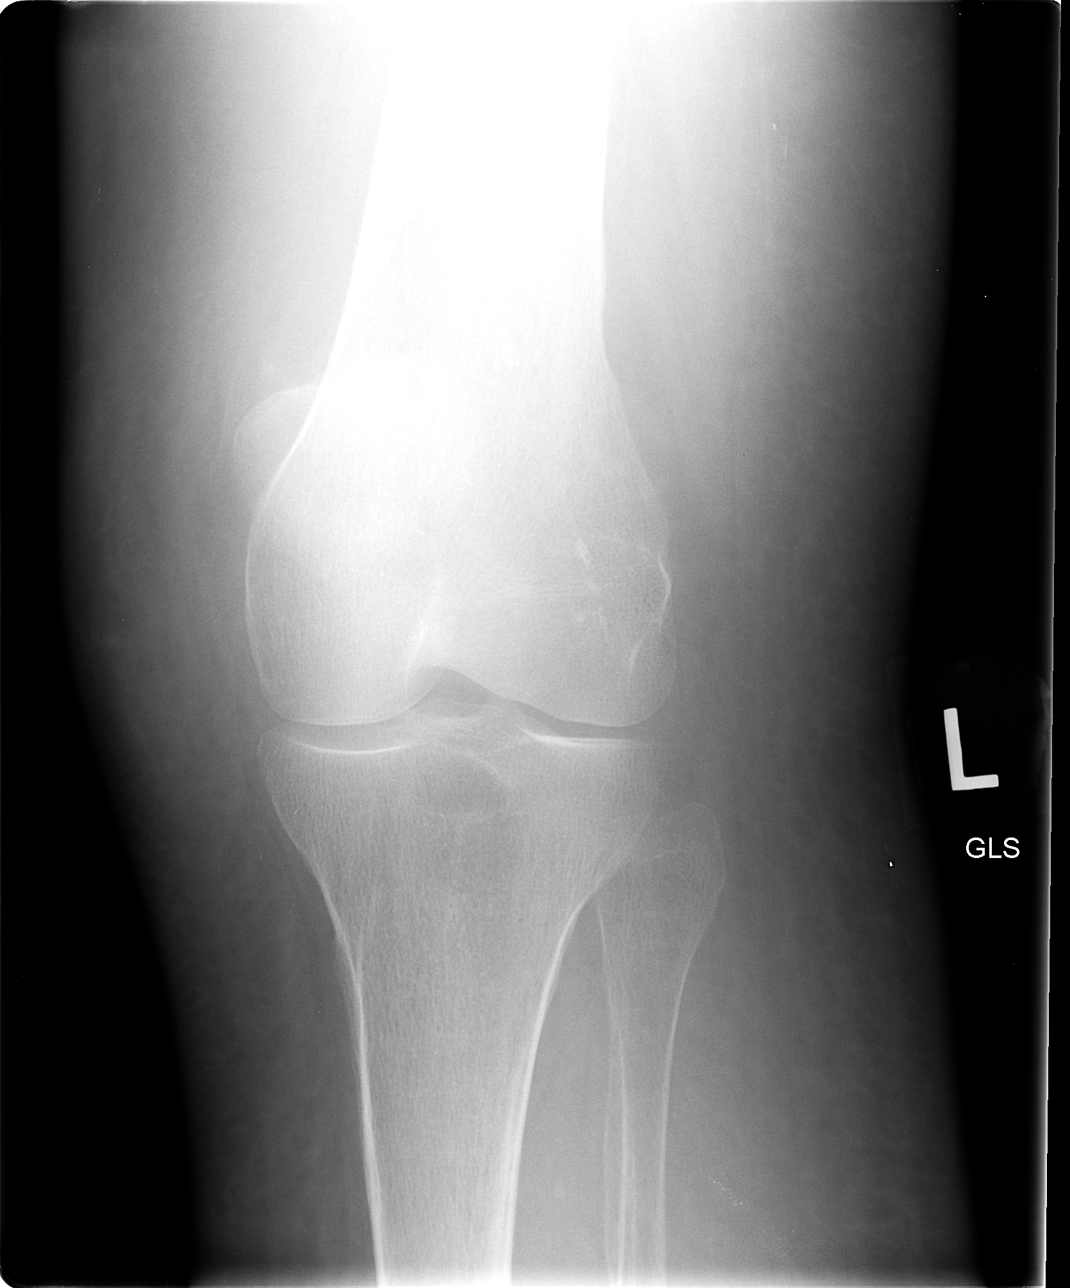

[view not recorded (3 of 4)]
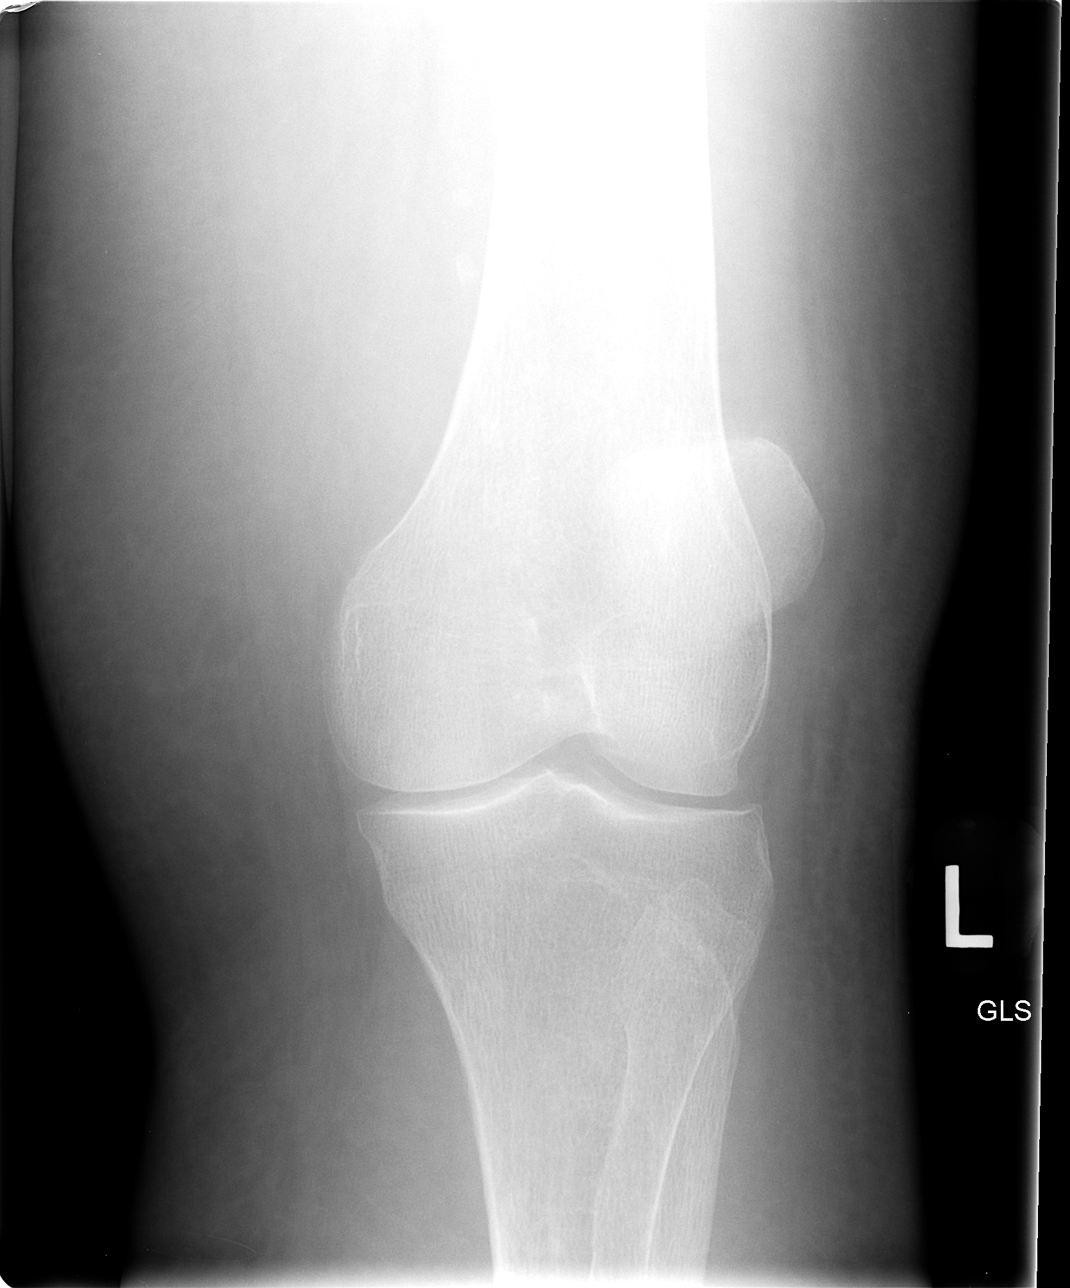

[view not recorded (4 of 4)]
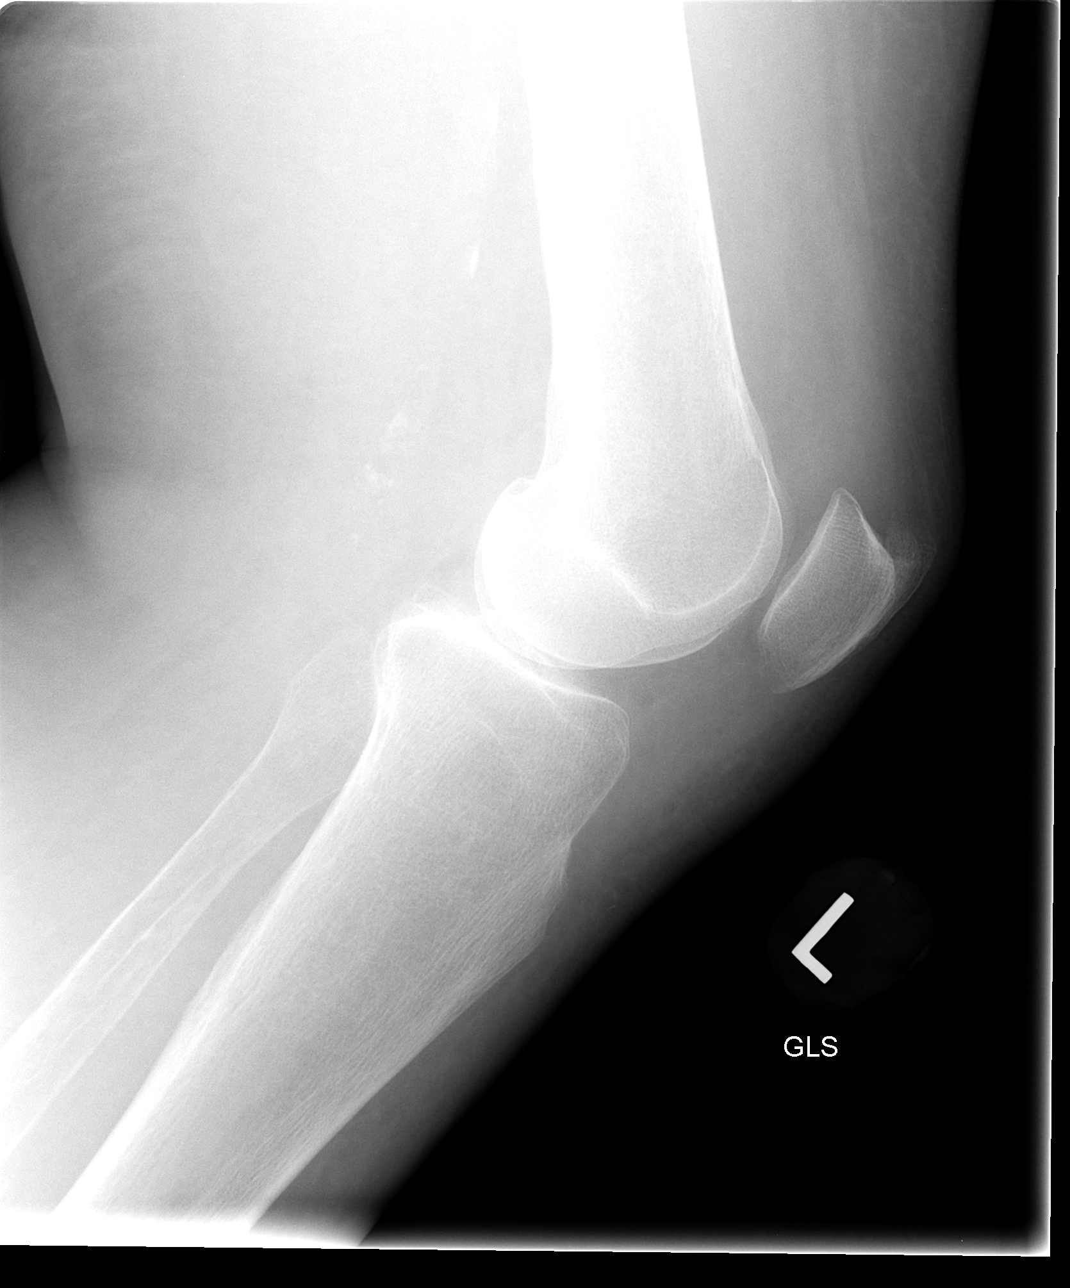

[4 of 4 positions shown; findings below may reference images not displayed]

FINDINGS: Four views of the left knee submitted.  No acute fracture
or subluxation.  Diffuse mild narrowing of the joint space.  Small
joint effusion.  Spurring of patella.
IMPRESSION: No acute fracture or subluxation.  Degenerative changes as
described above.

## 2014-07-09 ENCOUNTER — Ambulatory Visit: Payer: Medicare Other | Admitting: Vascular Surgery

## 2014-07-09 ENCOUNTER — Other Ambulatory Visit (HOSPITAL_COMMUNITY): Payer: Medicare Other

## 2014-07-09 ENCOUNTER — Encounter (HOSPITAL_COMMUNITY): Payer: Medicare Other

## 2014-07-30 IMAGING — CR DG CHEST 1V PORT
1 series · 1 of 1 positions shown · non-contrast
Comparison: 03/01/2012 and 02/02/2012.

CLINICAL DATA: Shortness of breath and chest pain today.  History
of smoking/COPD, hypertension and congestive heart failure.

PORTABLE CHEST - 1 VIEW

[view not recorded]
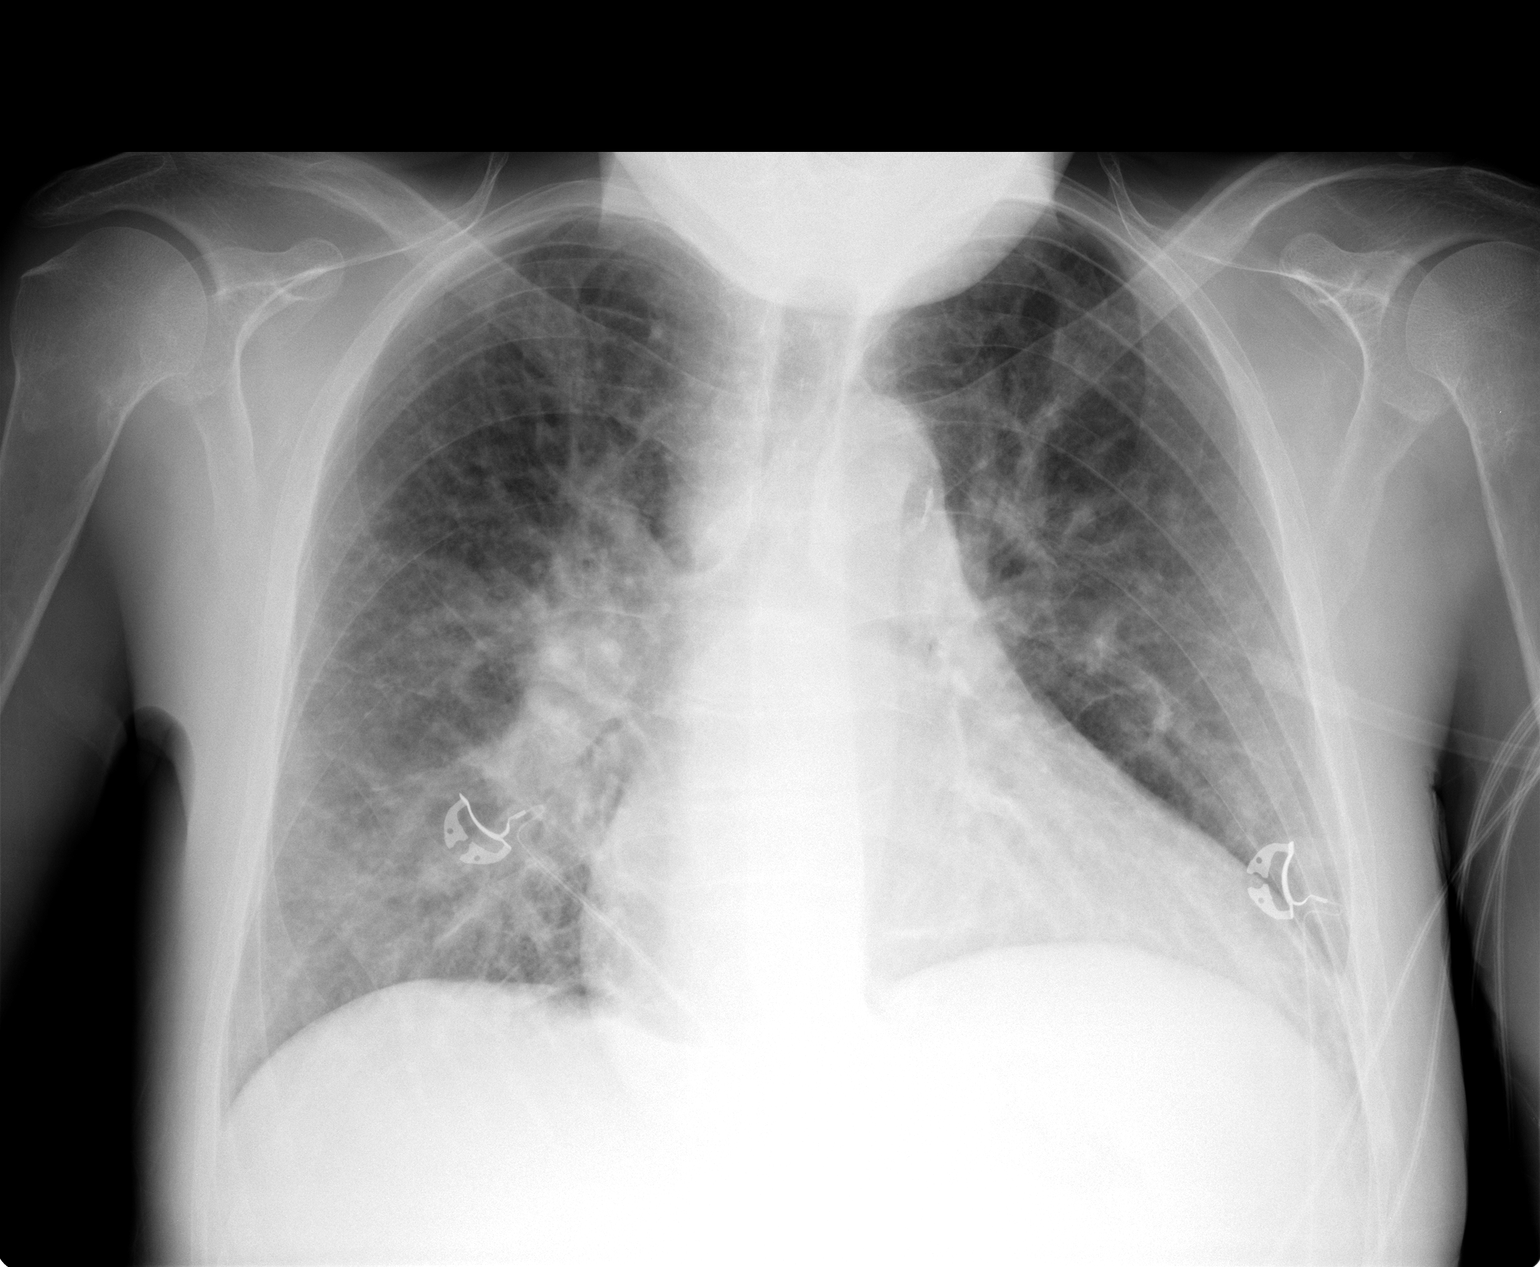

[1 of 1 positions shown; findings below may reference images not displayed]

FINDINGS: 1918 hours.  Cardiomegaly and vascular congestion are
again noted.  The previously demonstrated basilar air space
opacities have resolved.  There is no overt pulmonary edema or
significant pleural effusion.  No acute osseous findings are seen.
IMPRESSION: Cardiomegaly with chronic vascular congestion.  No residual focal
airspace disease or acute process identified.

## 2014-08-01 ENCOUNTER — Telehealth: Payer: Self-pay | Admitting: Vascular Surgery

## 2014-08-01 NOTE — Telephone Encounter (Signed)
[place] from [place] [facility] [place] called to let us know that Jettie Boozeimothy Sagona passed away on 07/28/14. She has an appointment on 08/06/14 that we need to cancel out.  Thanks, Revonda StandardAllison

## 2014-08-06 ENCOUNTER — Encounter (HOSPITAL_COMMUNITY): Payer: Medicare Other

## 2014-08-06 ENCOUNTER — Other Ambulatory Visit (HOSPITAL_COMMUNITY): Payer: Medicare Other

## 2014-08-06 ENCOUNTER — Ambulatory Visit: Payer: Medicare Other | Admitting: Vascular Surgery

## 2014-08-13 ENCOUNTER — Encounter (HOSPITAL_COMMUNITY): Payer: Medicare Other

## 2014-08-13 ENCOUNTER — Other Ambulatory Visit (HOSPITAL_COMMUNITY): Payer: Medicare Other

## 2014-08-13 ENCOUNTER — Ambulatory Visit: Payer: Medicare Other | Admitting: Vascular Surgery

## 2014-08-19 DEATH — deceased

## 2014-09-03 IMAGING — CR DG CHEST 1V PORT
1 series · 1 of 1 positions shown · non-contrast
Comparison: 01/14/2013

CLINICAL DATA: Shortness of breath, weakness

PORTABLE CHEST - 1 VIEW

[view not recorded]
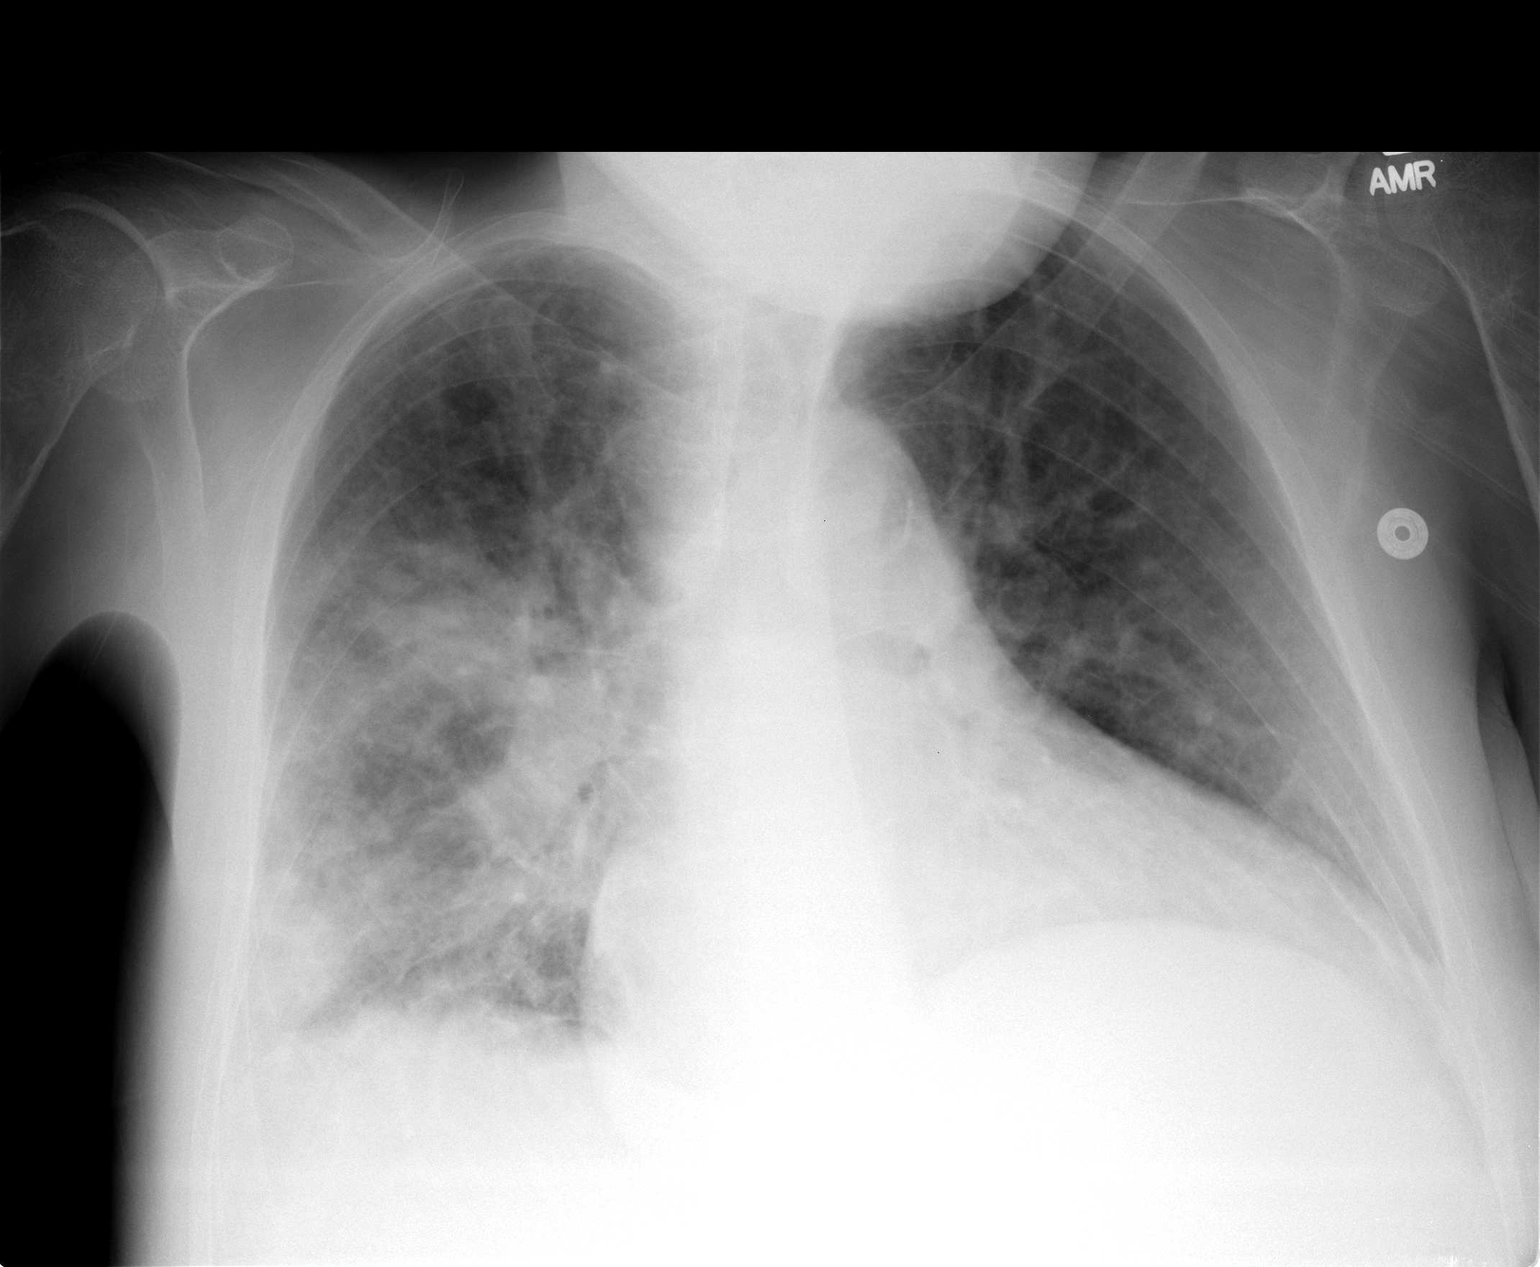

[1 of 1 positions shown; findings below may reference images not displayed]

FINDINGS: Patchy right midlung and lower lobe airspace opacity has
developed compatible with right lung pneumonia.  Left lung remains
clear.  No significant effusion or pneumothorax.  Stable
cardiomegaly without underlying CHF.
IMPRESSION: New patchy right lung airspace process compatible with pneumonia.

## 2014-09-06 IMAGING — CR DG CHEST 2V
2 series · 2 of 2 positions shown · non-contrast
Comparison: Chest radiograph 02/18/2013

CLINICAL DATA: Right middle lobe infiltrate

CHEST - 2 VIEW

[view not recorded (1 of 2)]
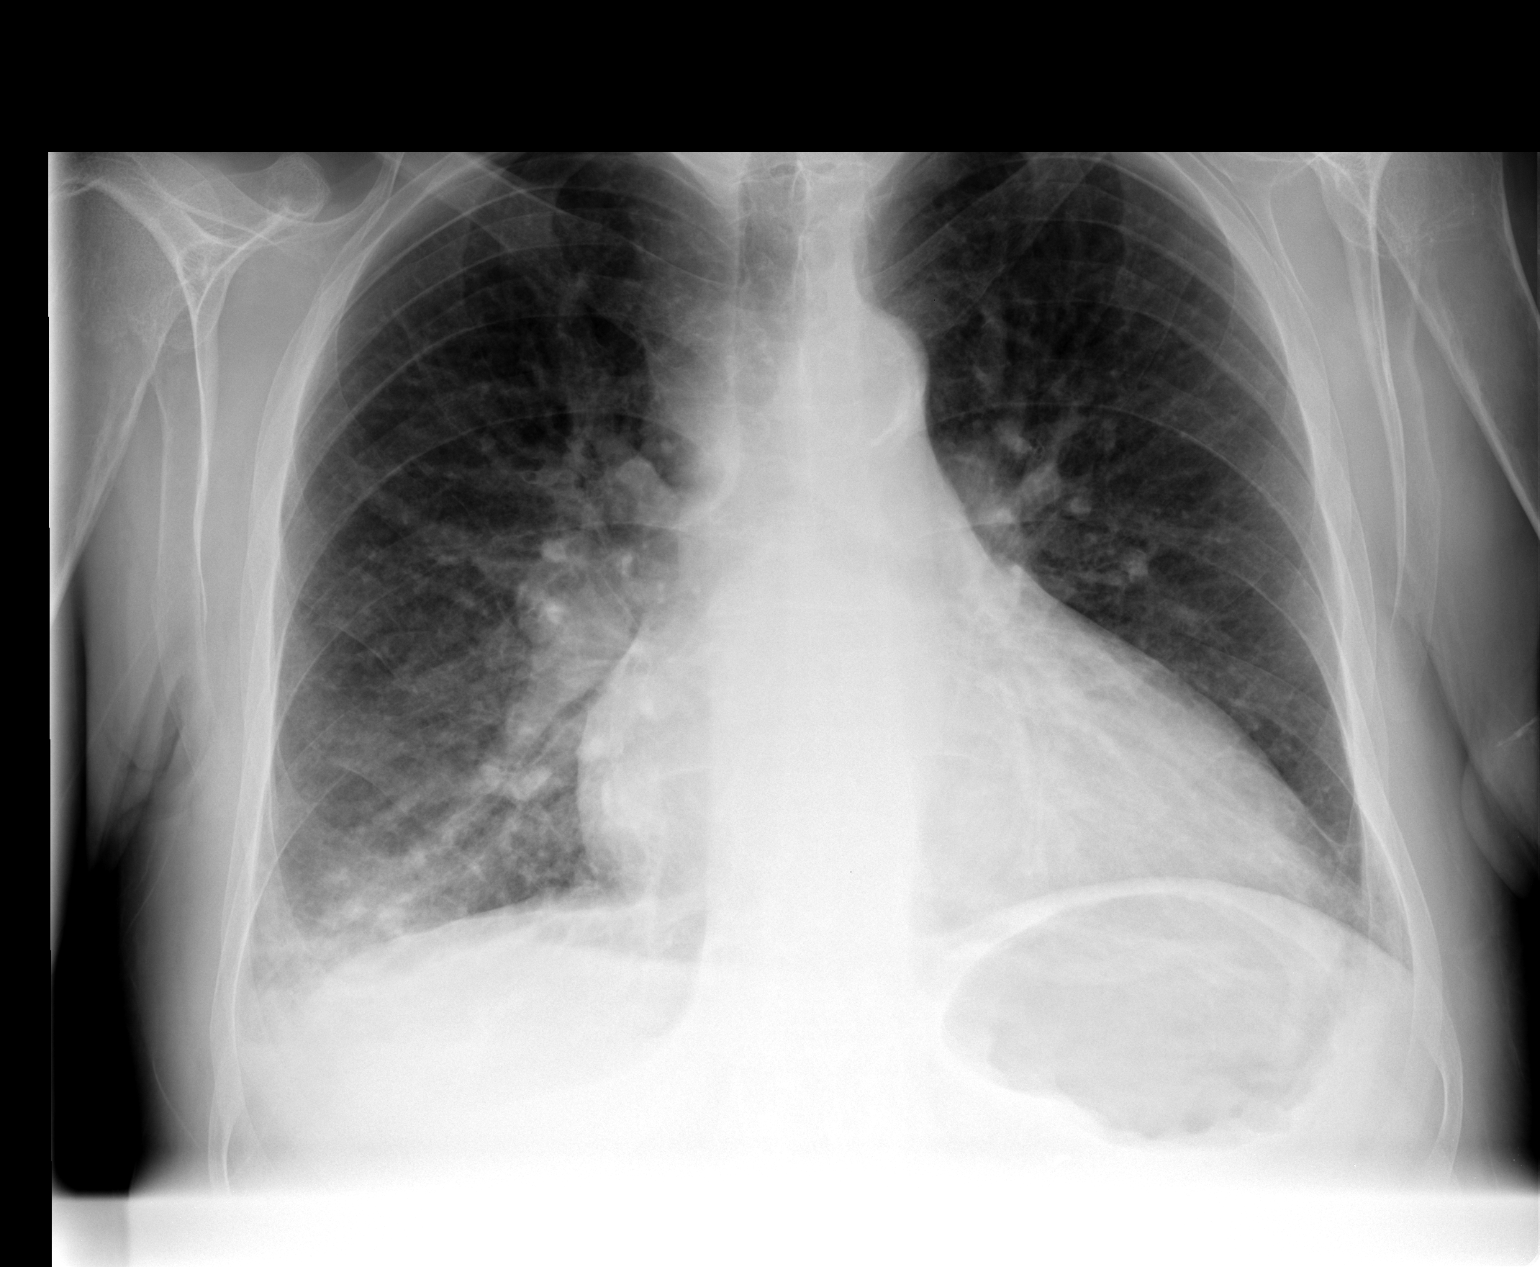

[view not recorded (2 of 2)]
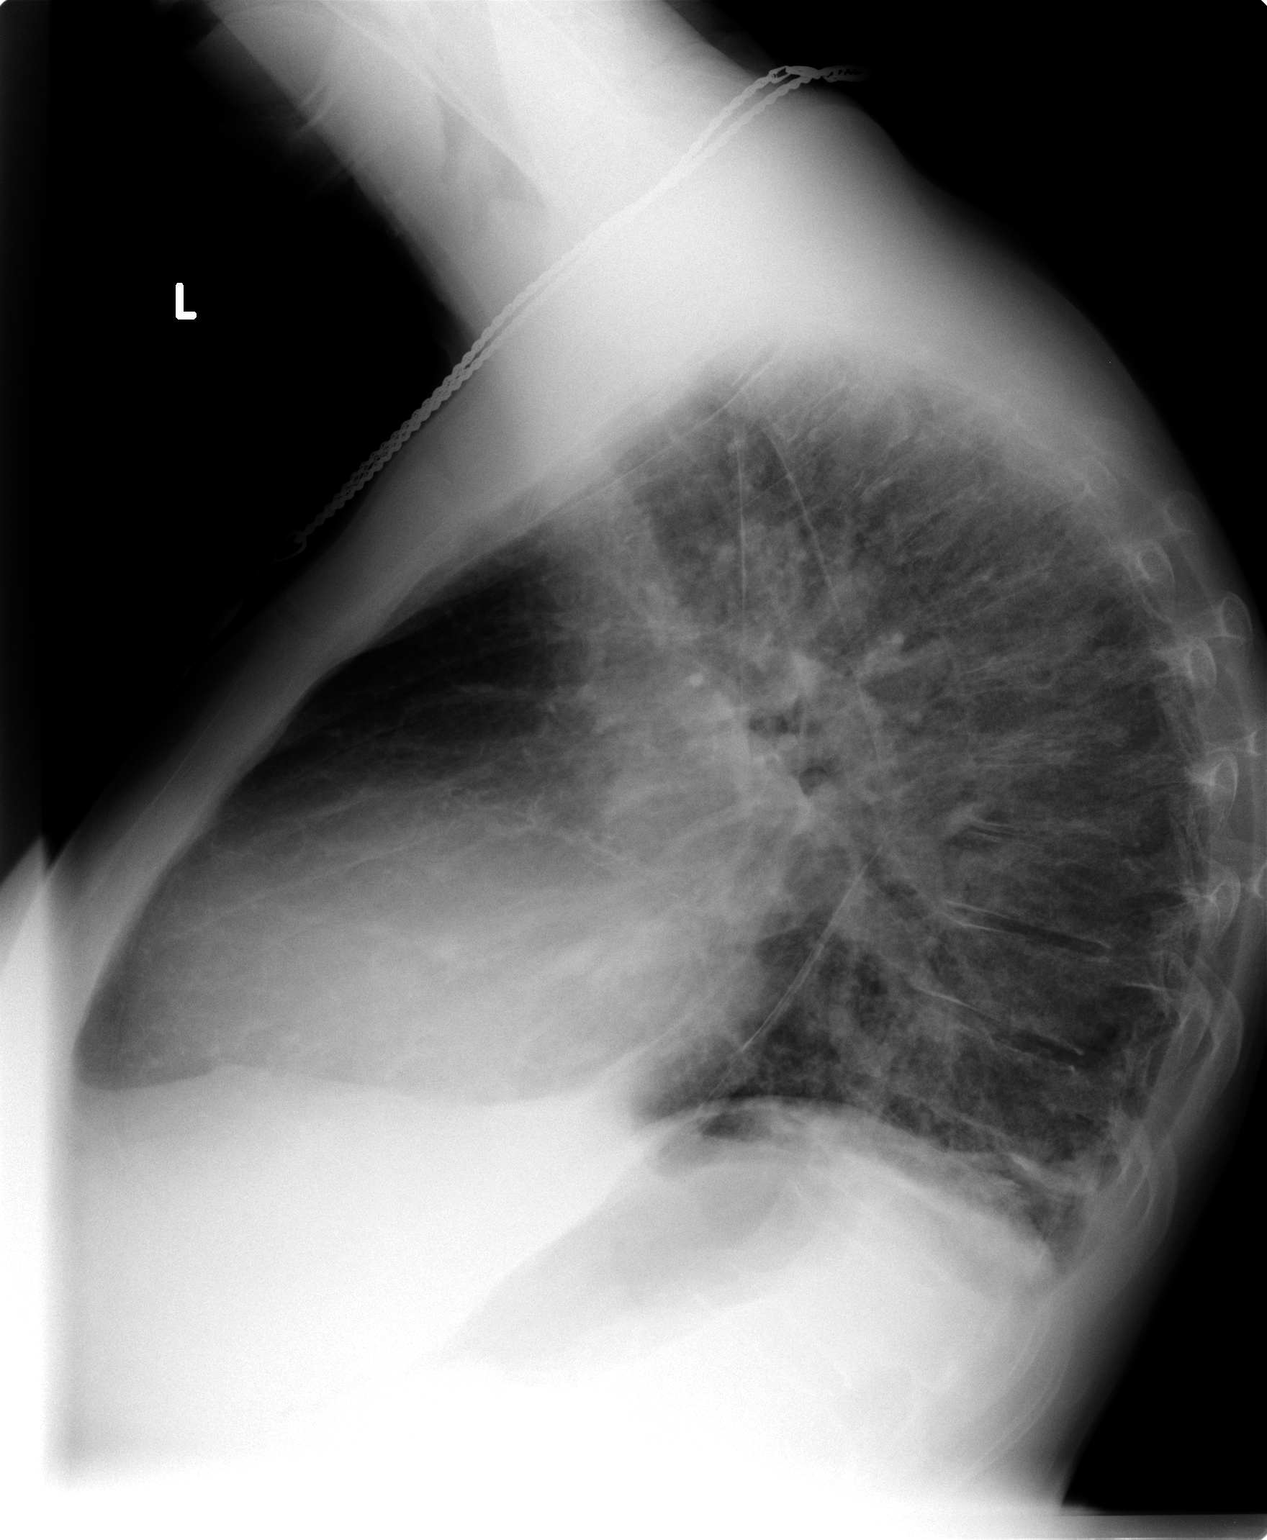

[2 of 2 positions shown; findings below may reference images not displayed]

FINDINGS: Stable enlarged heart silhouette.  There is some
improvement in the left lower lobe and left upper lobe air space
disease.  No pneumothorax.  No pleural fluid.  Lungs are
hyperinflated.
IMPRESSION: Some improvement in the right upper lobe and lower lobe air space
disease.  Recommend follow-up radiographs after treatment to ensure
resolution.

## 2014-10-28 IMAGING — CR DG HIP COMPLETE 2+V*R*
3 series · 3 of 3 positions shown · non-contrast
Comparison: None

CLINICAL DATA: 36-year-old male with right hip pain following
injury.

RIGHT HIP - COMPLETE 2+ VIEW

[view not recorded (1 of 3)]
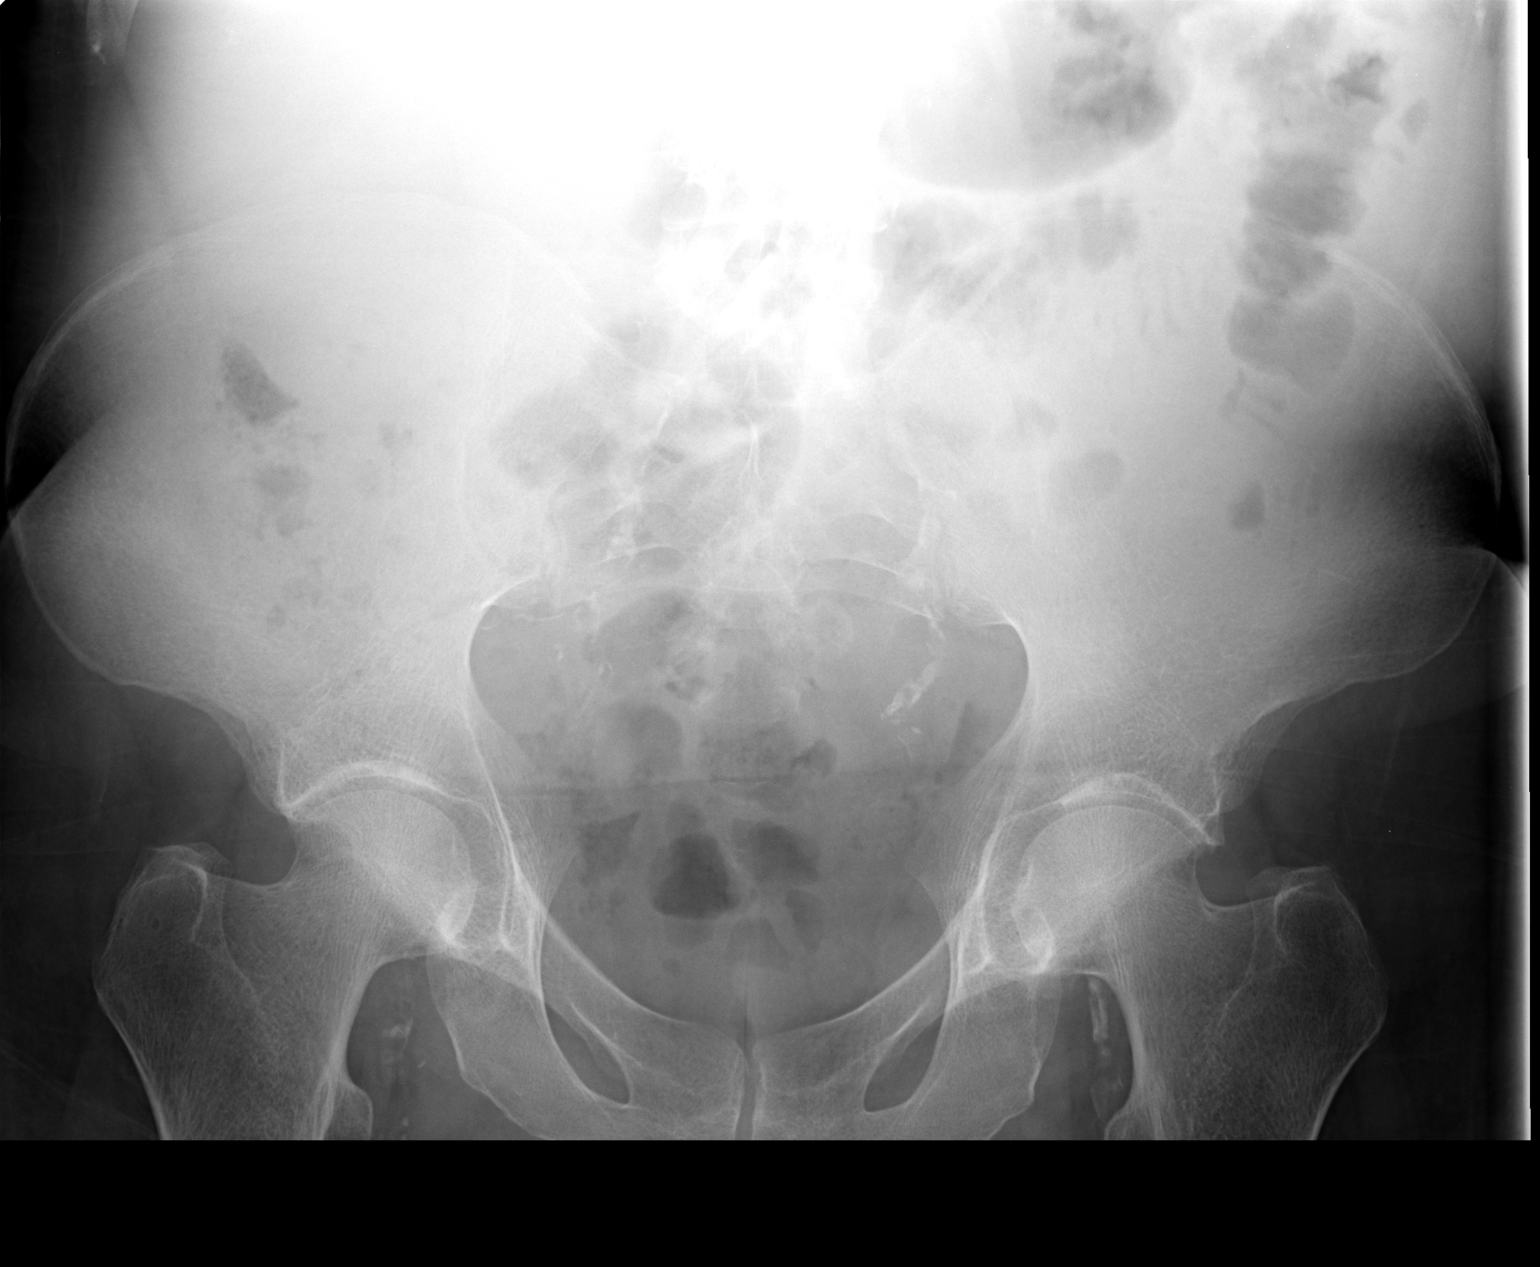

[view not recorded (2 of 3)]
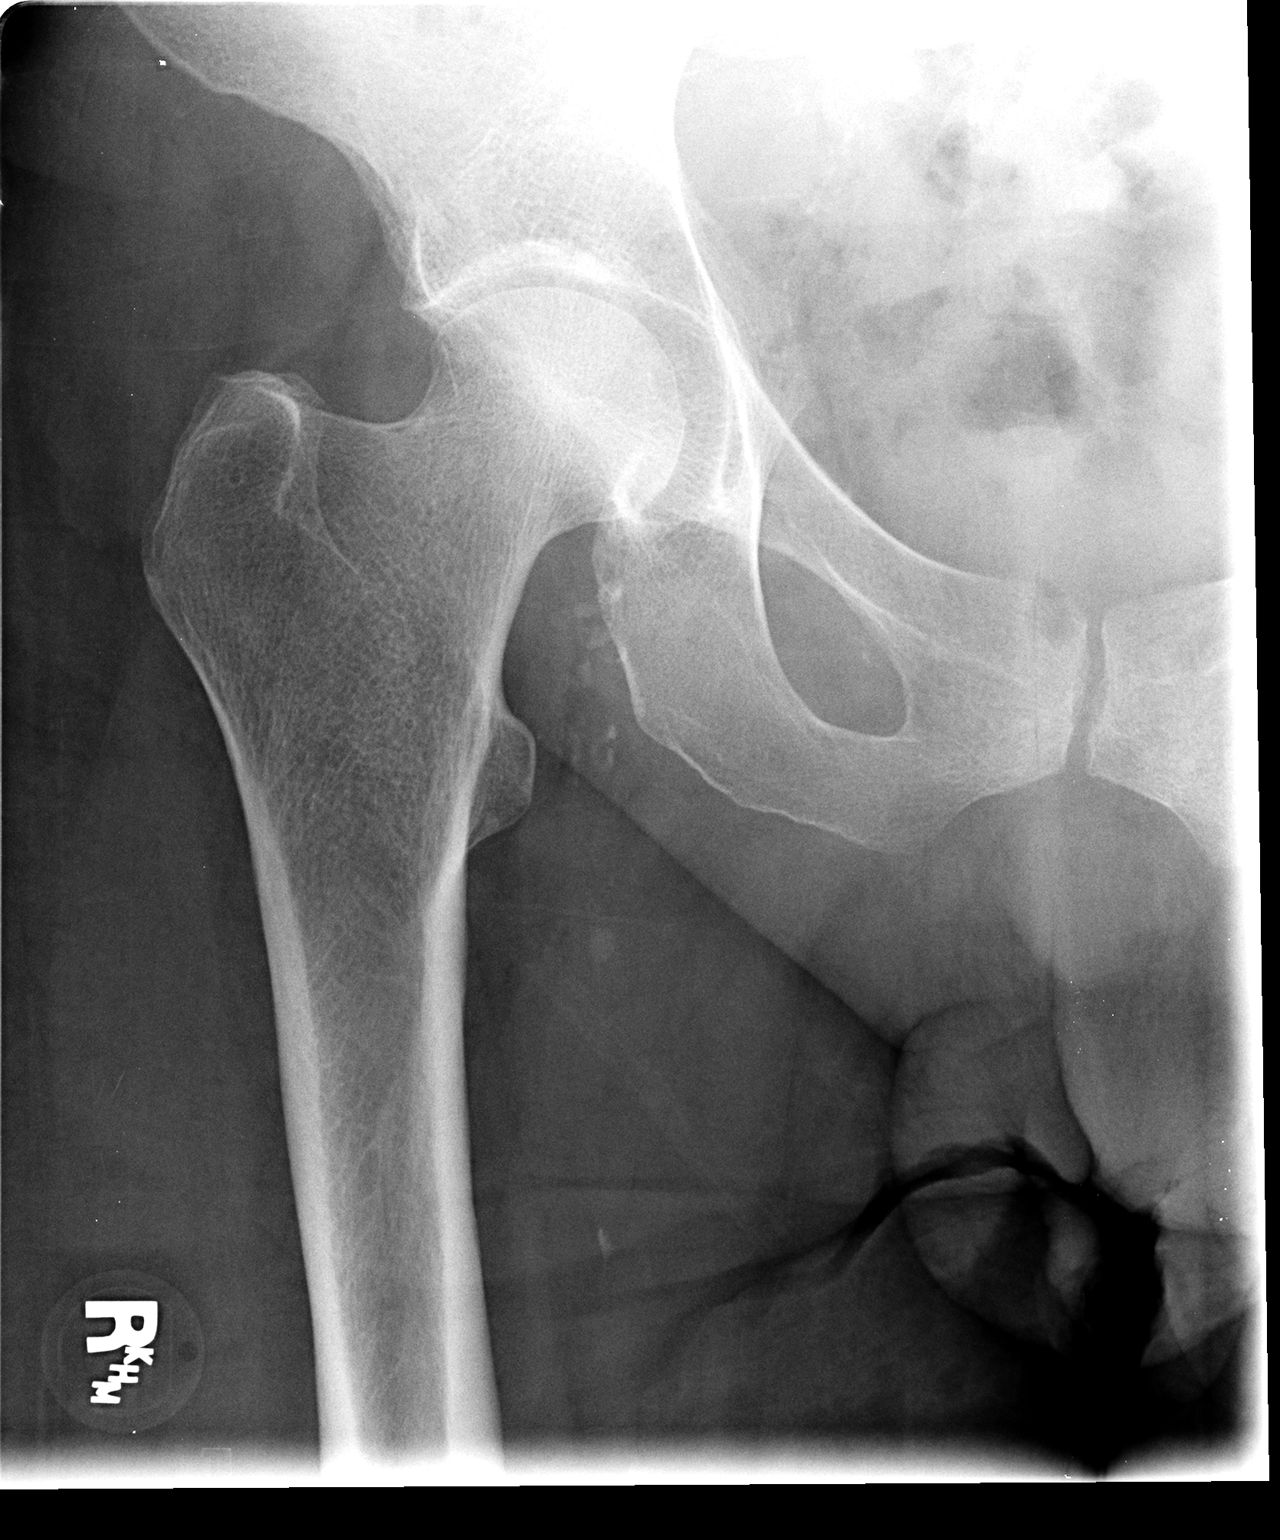

[view not recorded (3 of 3)]
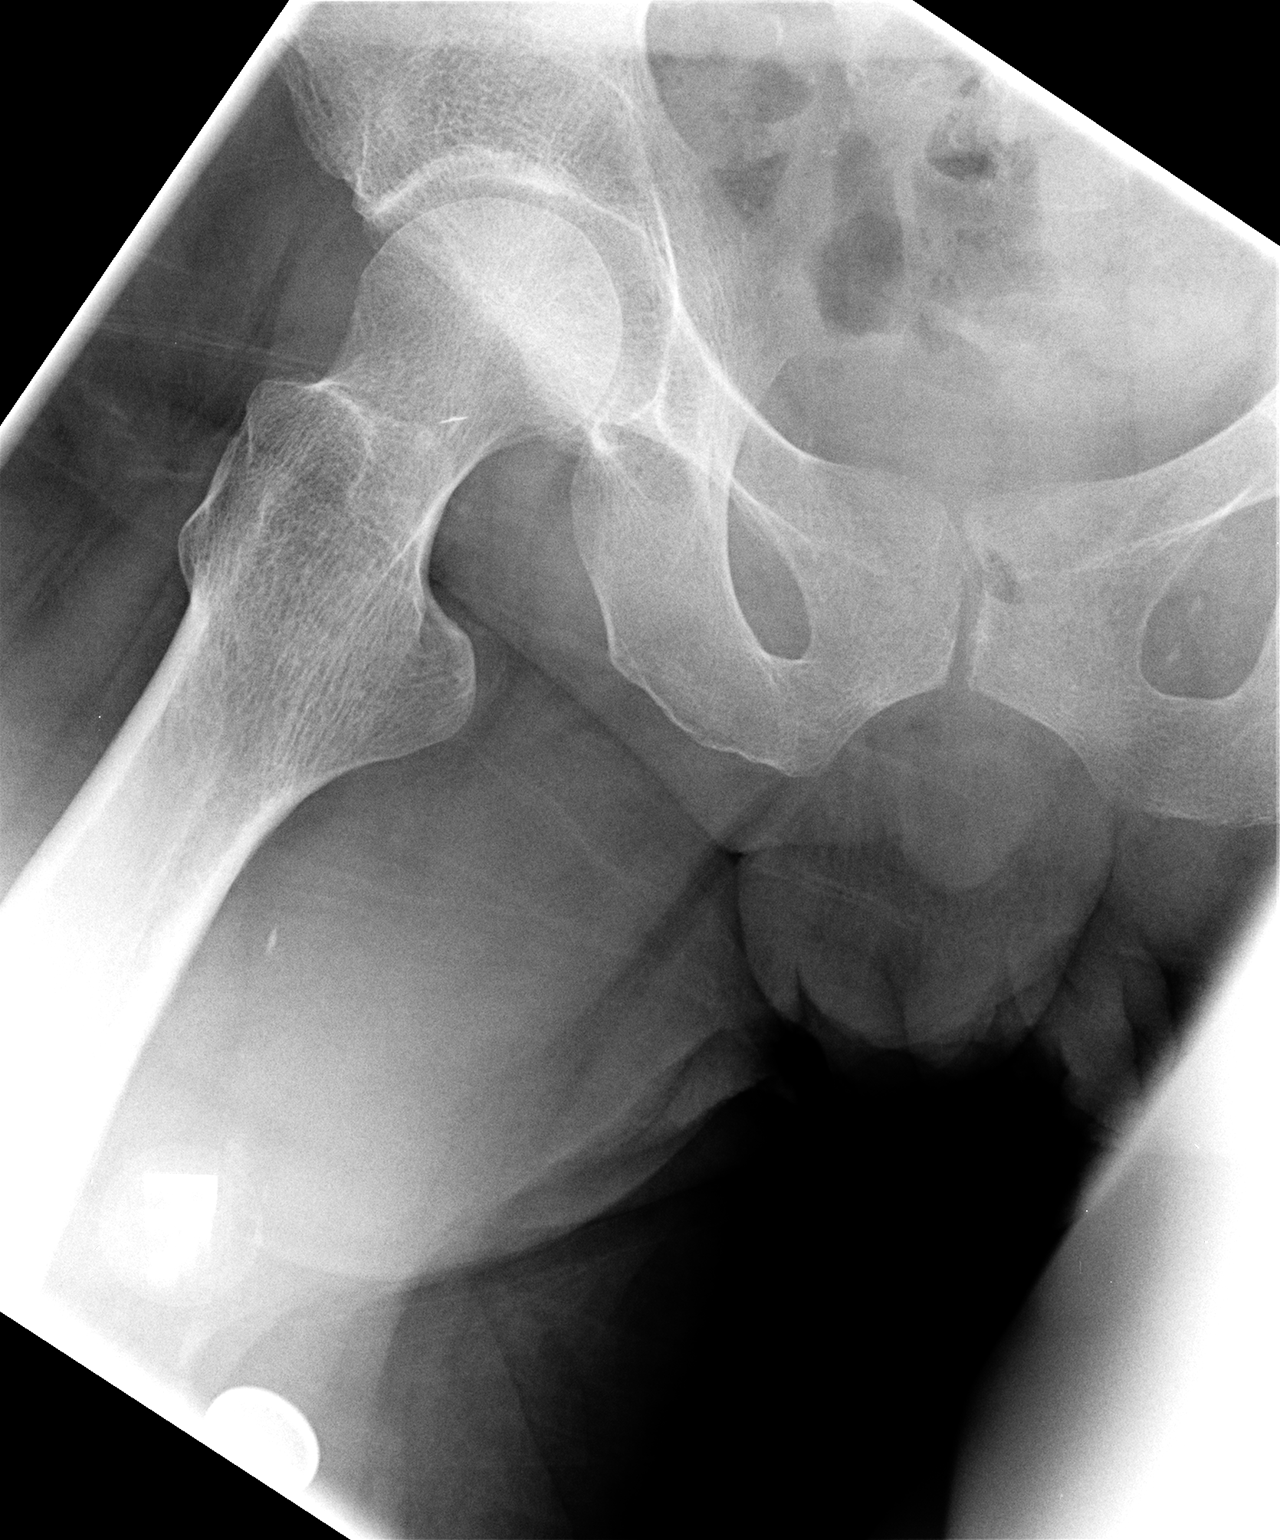

[3 of 3 positions shown; findings below may reference images not displayed]

FINDINGS: There is no evidence of fracture, subluxation, or
dislocation.
No focal bony lesions are present.
The joint spaces are unremarkable.
Atherosclerotic vascular calcifications are identified, advanced
for age.
IMPRESSION: No evidence of acute bony abnormality.

Atherosclerotic vascular calcifications, advanced for age.

## 2014-11-02 IMAGING — CR DG CHEST 1V PORT
1 series · 1 of 1 positions shown · non-contrast
Comparison: 03/21/2013

CLINICAL DATA: Altered mental status

PORTABLE CHEST - 1 VIEW

[view not recorded]
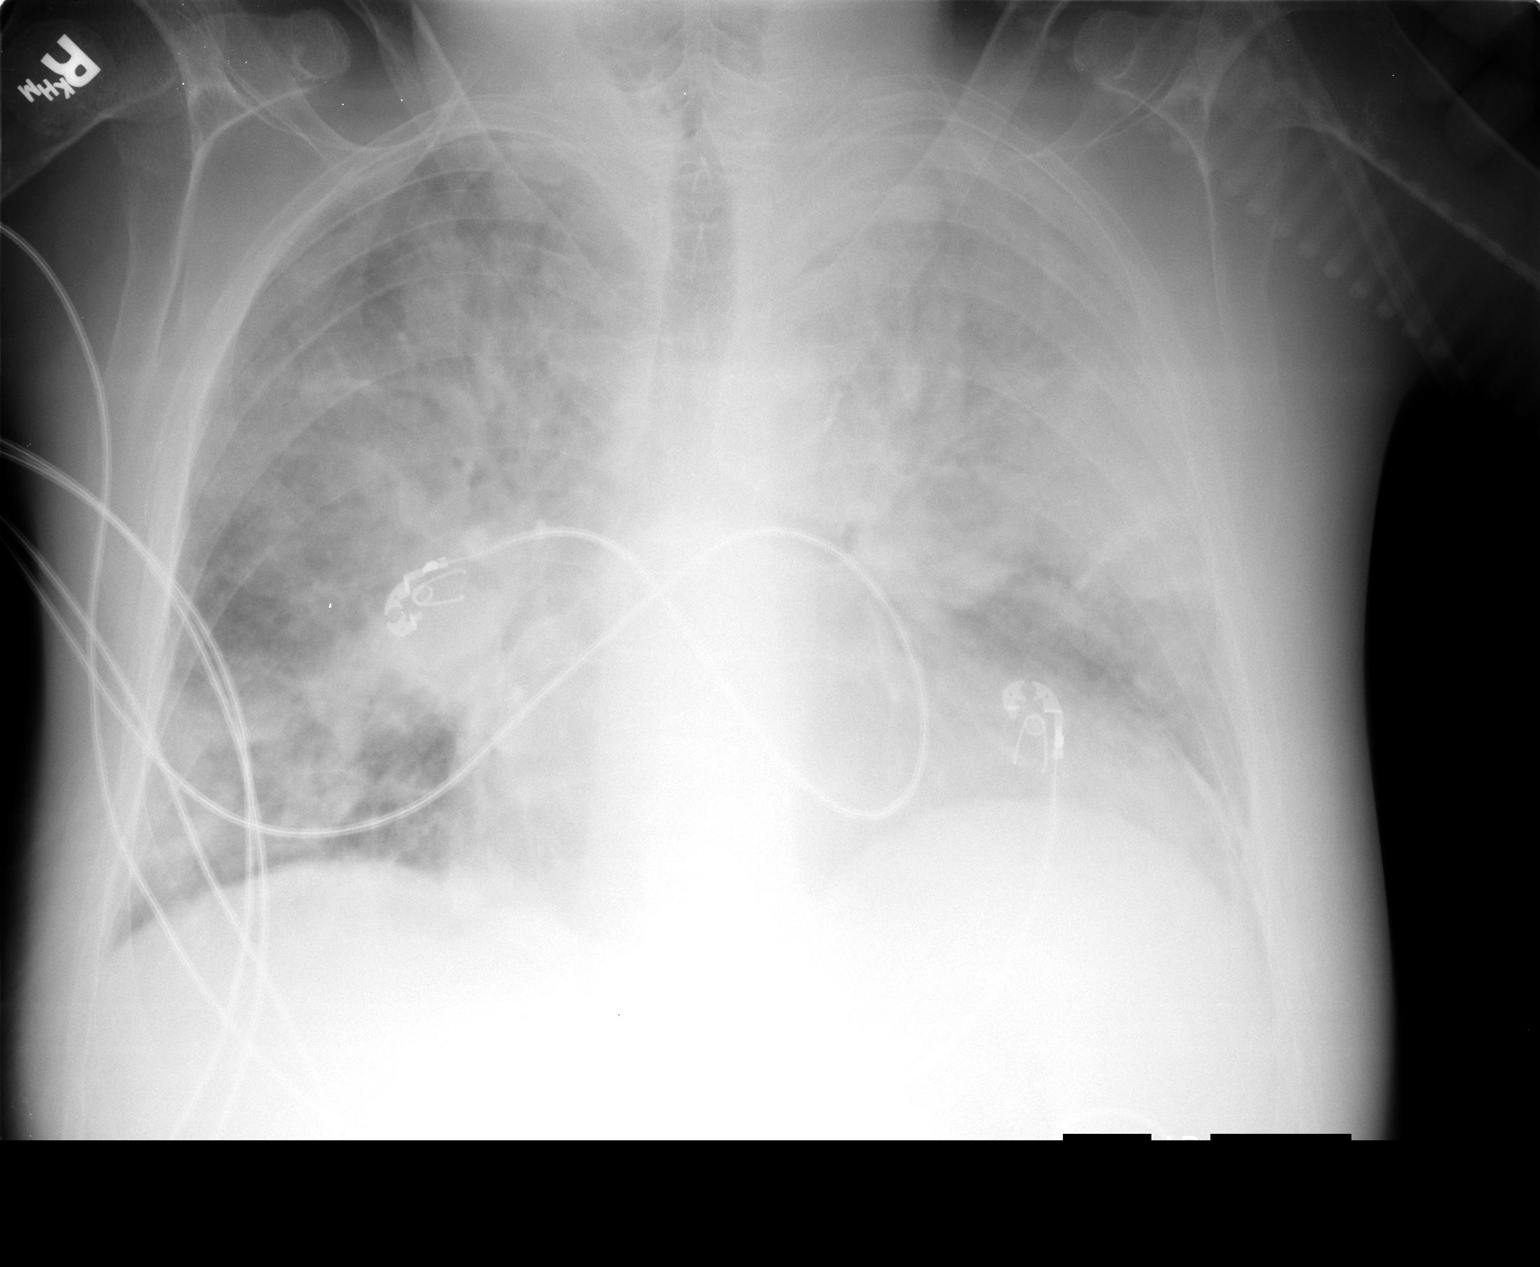

[1 of 1 positions shown; findings below may reference images not displayed]

FINDINGS: Cardiomegaly again noted.  There is central vascular
congestion and bilateral airspace disease highly suspicious for
bilateral pulmonary edema.  Superimposed infiltrate cannot be
excluded.  Clinical correlation is necessary.
IMPRESSION: There is central vascular congestion and bilateral airspace
disease highly suspicious for bilateral pulmonary edema.
Superimposed infiltrate cannot be excluded.  Clinical correlation
is necessary.

## 2014-11-03 IMAGING — CR DG CHEST 1V PORT
2 series · 2 of 2 positions shown · non-contrast
Comparison: 04/19/2013

CLINICAL DATA: Respiratory failure

PORTABLE CHEST - 1 VIEW

[AP (1 of 2)]
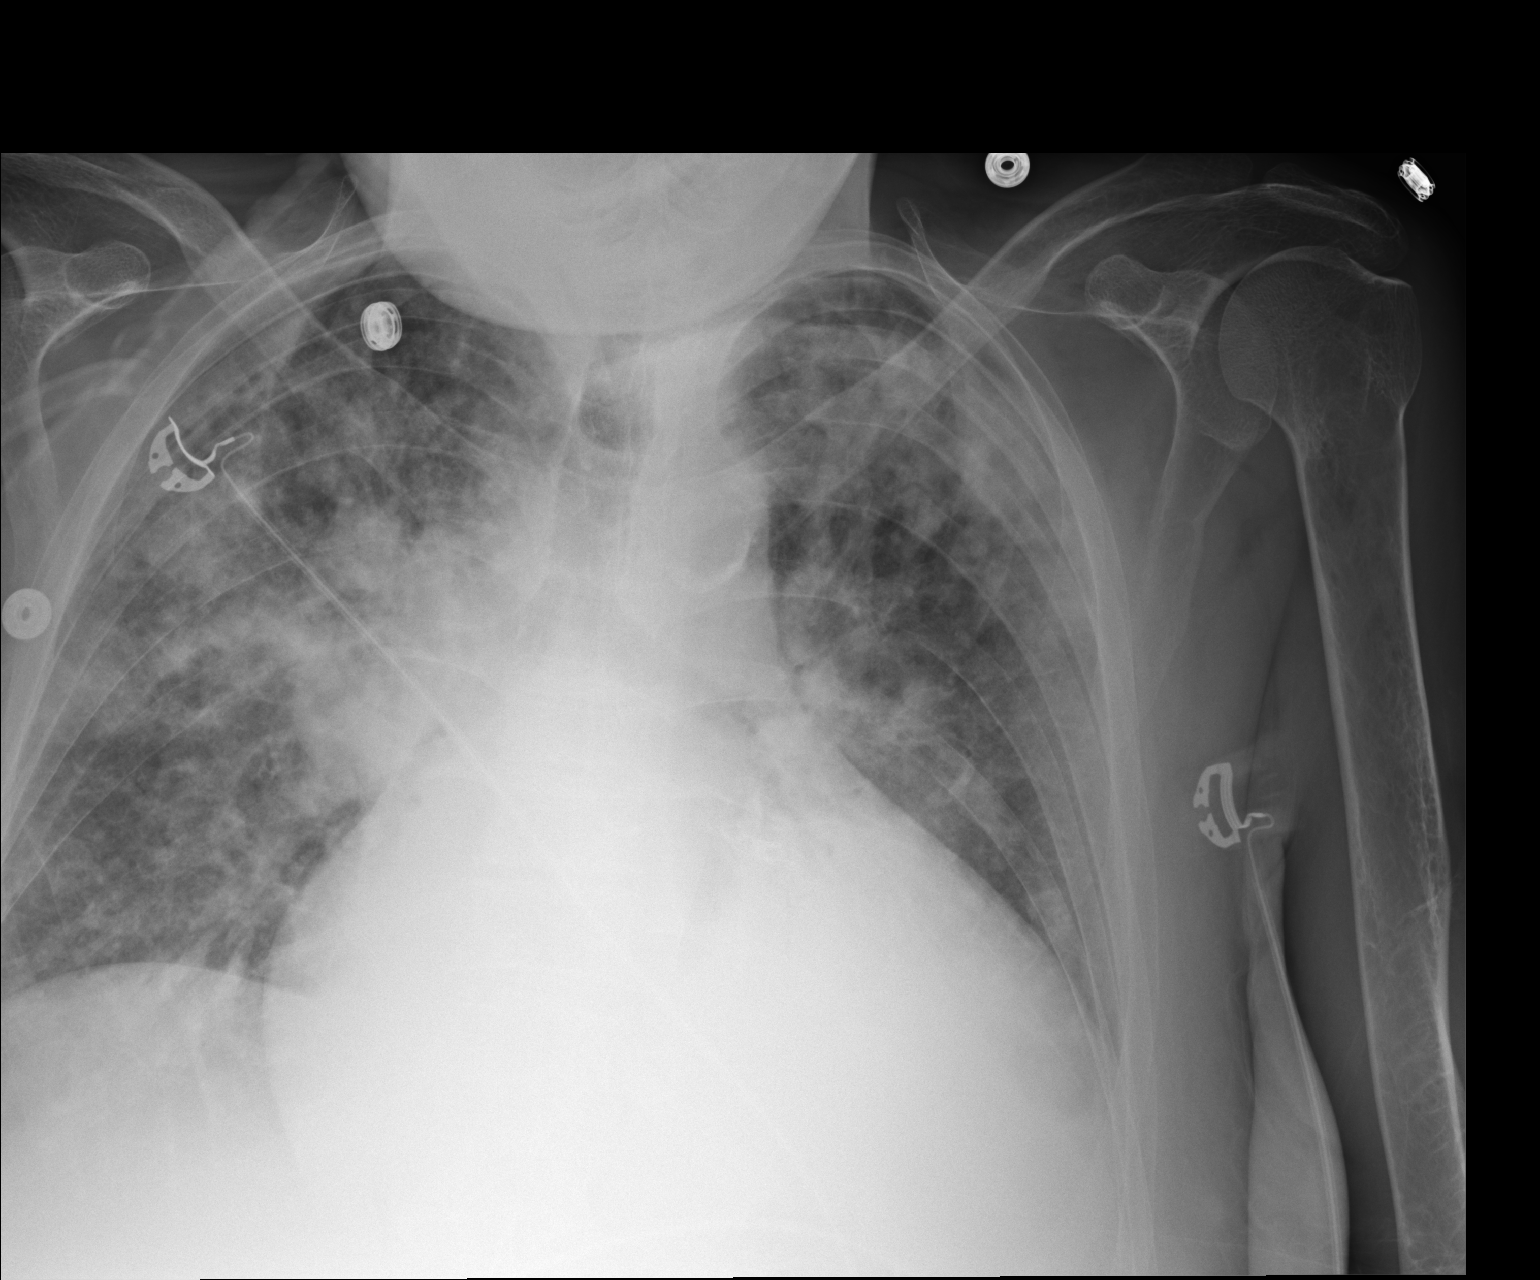

[AP (2 of 2)]
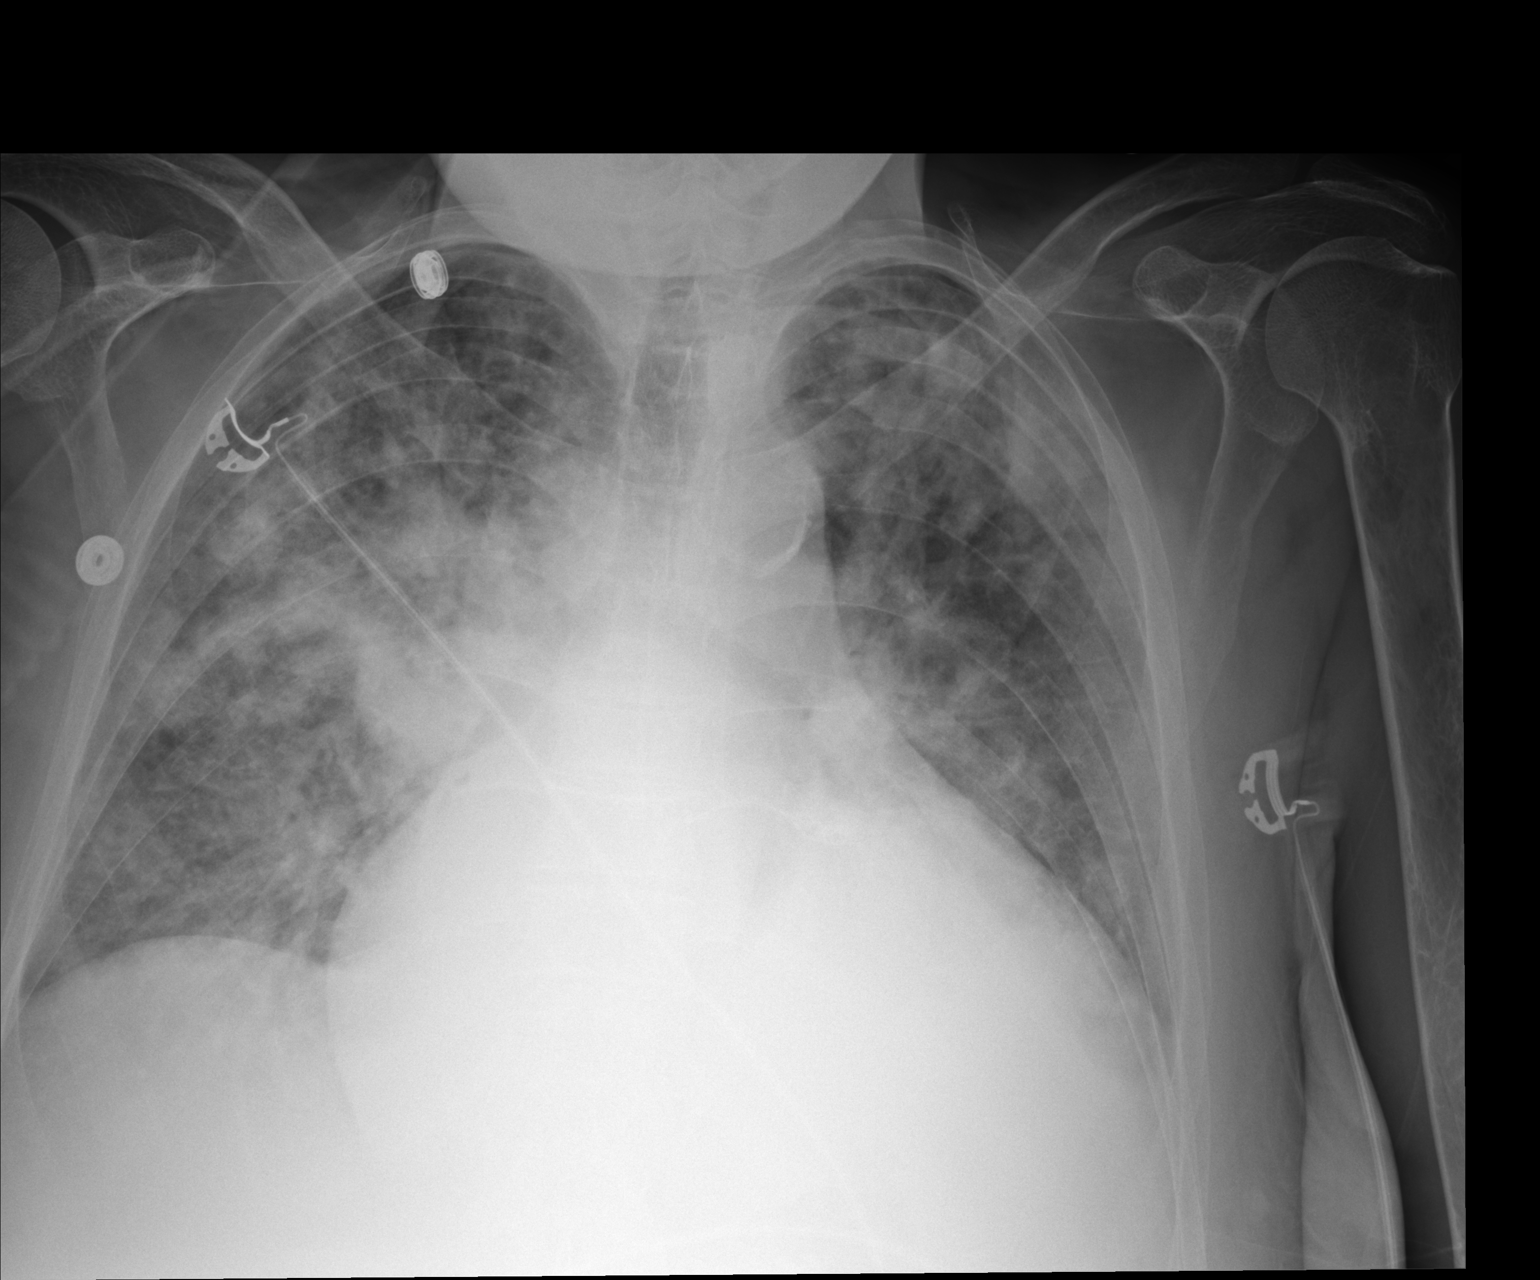

[2 of 2 positions shown; findings below may reference images not displayed]

FINDINGS: Marked cardiomegaly/cardiac silhouette enlargement.
Aortic atherosclerosis. Hilar prominence may reflect central
vascular congestion.  Bilateral airspace opacities shows some
interval improvement with right perihilar fullness persisting.
Small effusions not excluded.  No pneumothorax.  Periphery of the
no acute osseous finding.]
IMPRESSION: Some interval improved aeration since the prior, with persistent
multifocal airspace opacity; asymmetric edema versus multifocal
pneumonia.

Persistent cardiomegaly and hilar prominence.

## 2014-11-07 IMAGING — CR DG CHEST 1V PORT
1 series · 1 of 1 positions shown · non-contrast
Comparison: 04/20/2013 and 04/19/2013.

CLINICAL DATA: Pulmonary edema.

PORTABLE CHEST - 1 VIEW

[AP]
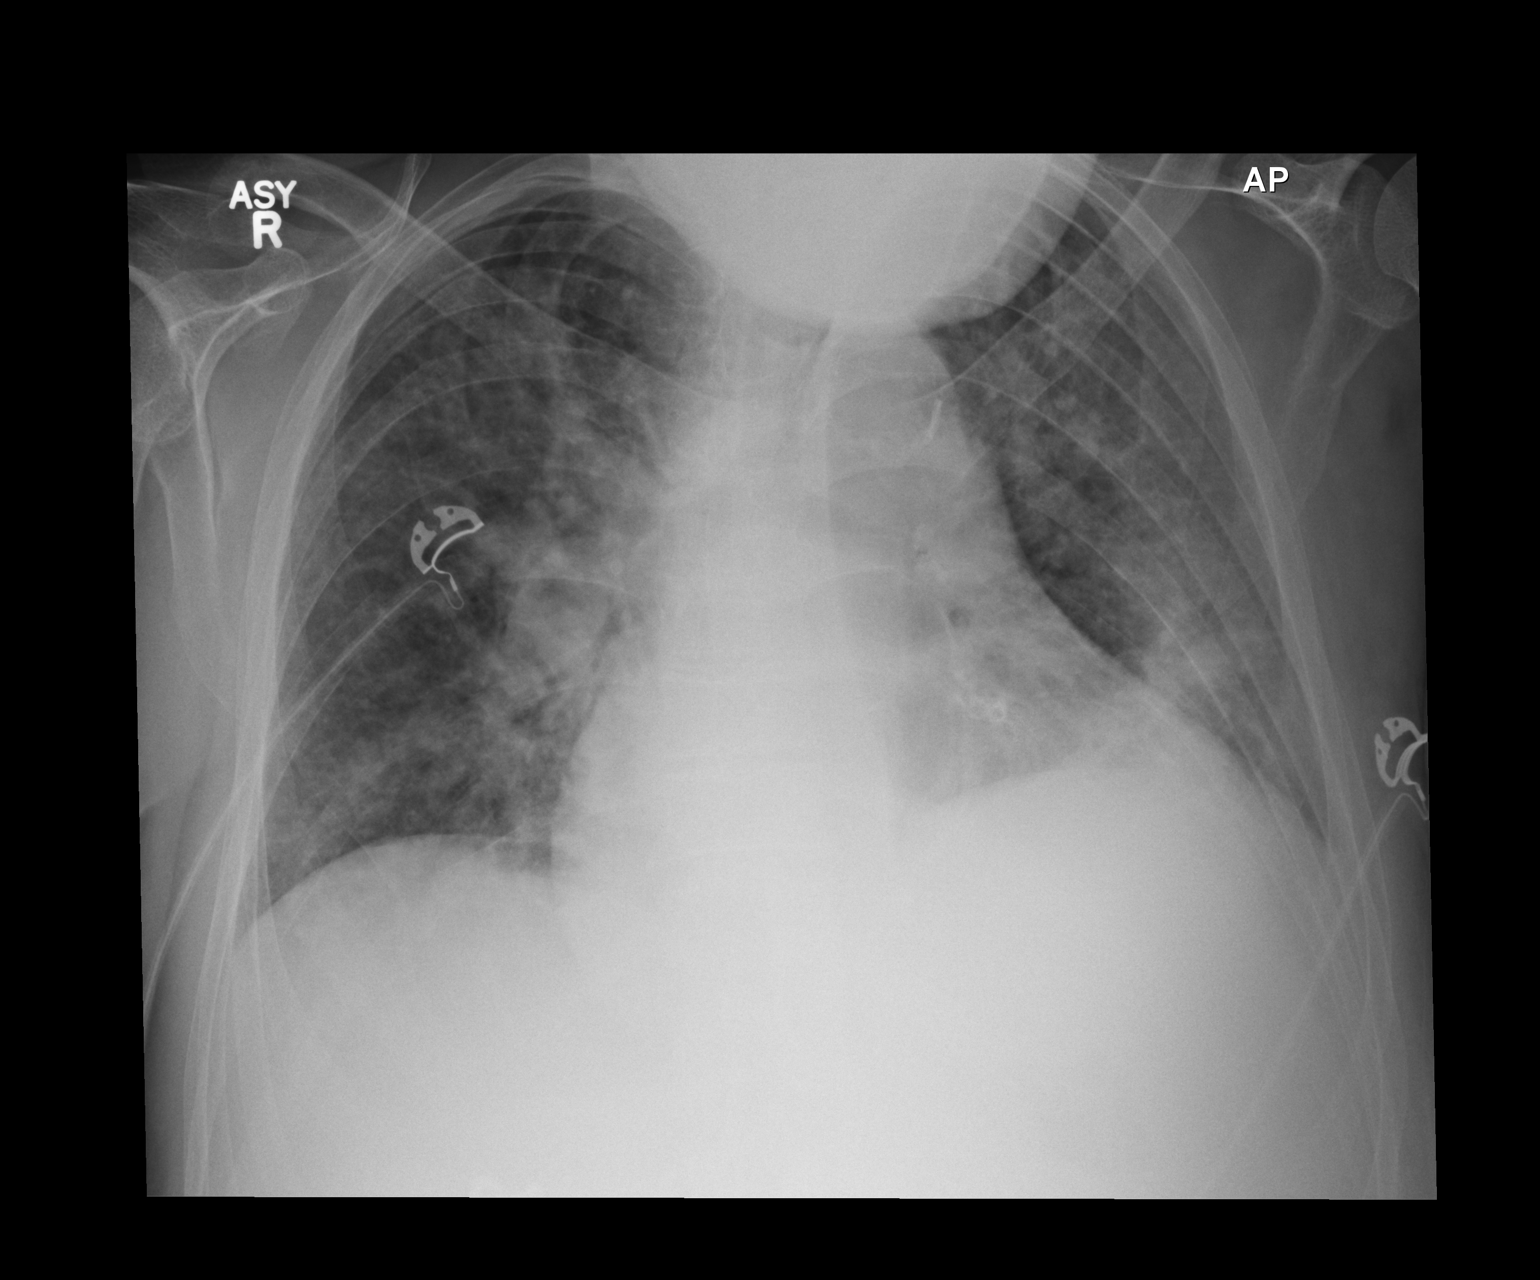

[1 of 1 positions shown; findings below may reference images not displayed]

FINDINGS: 4442 hours.  The heart remains enlarged.  There are
persistent low lung volumes.  There has been further clearing in
the bilateral air space opacities most consistent with resolving
edema.  A more confluent component remains in the left lower lobe.
A small amount of pleural fluid is likely bilaterally.  No
pneumothorax is seen.
IMPRESSION: Further improvement in bilateral air space opacities.

## 2014-11-16 IMAGING — CT CT ABD-PELV W/O CM
2 of 4 series · 17 of 46 positions shown, 19 images · non-contrast
Comparison: 06/22/2012

CLINICAL DATA: Hematuria and right flank pain.

CT ABDOMEN AND PELVIS WITHOUT CONTRAST
TECHNIQUE: Multidetector CT imaging of the abdomen and pelvis was
performed following the standard protocol without intravenous
contrast.

[Series 2: abdomen/pelvis w/o contrast · axial · non-contrast · 0.77mm/px · z∈[-422,-67]mm · 14 of 83 slices shown, 16 images]
[im 6/83  soft-tissue]
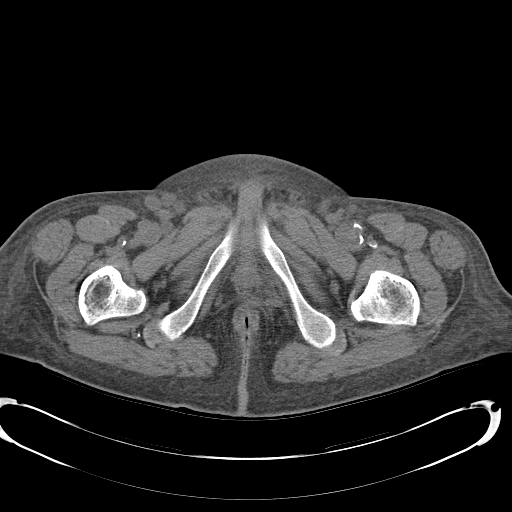
[im 6/83  bone]
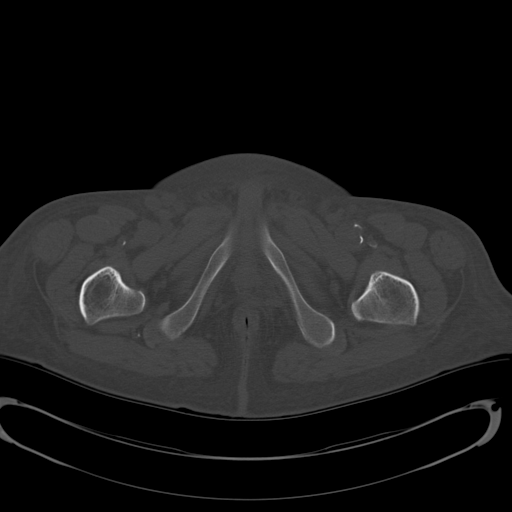
[im 11/83  soft-tissue]
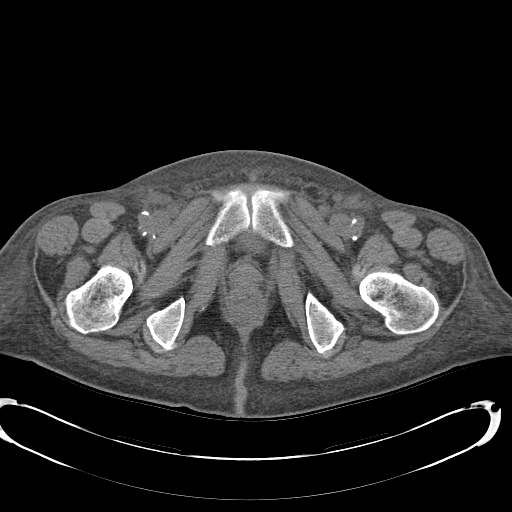
[im 17/83  soft-tissue]
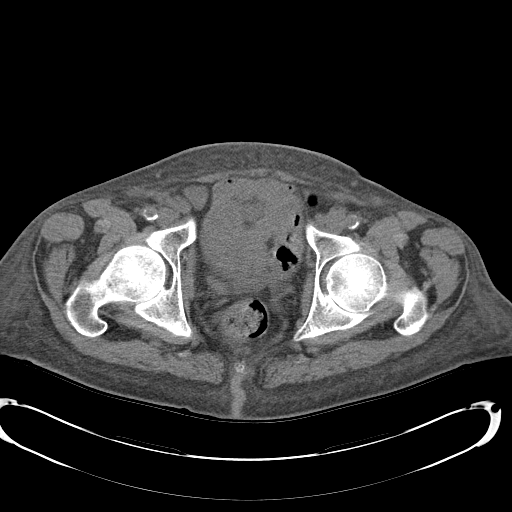
[im 22/83  soft-tissue]
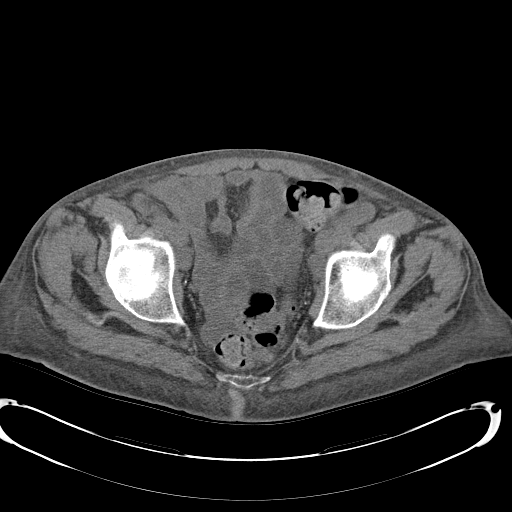
[im 28/83  soft-tissue]
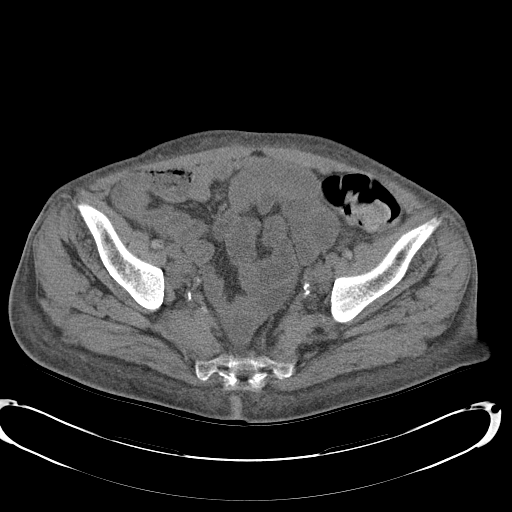
[im 33/83  soft-tissue]
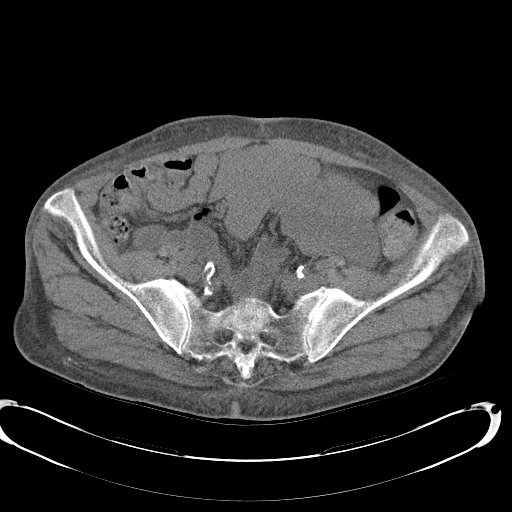
[im 39/83  soft-tissue]
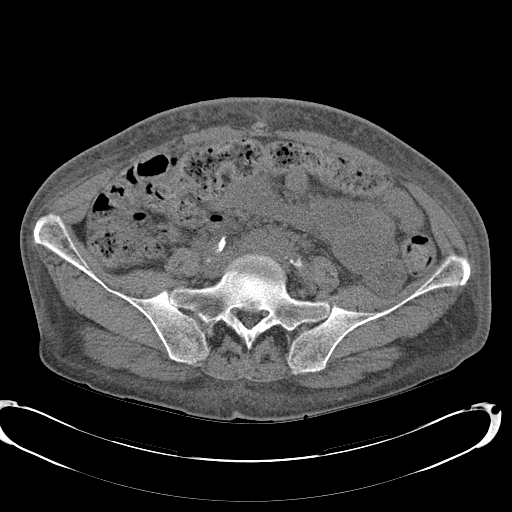
[im 44/83  soft-tissue]
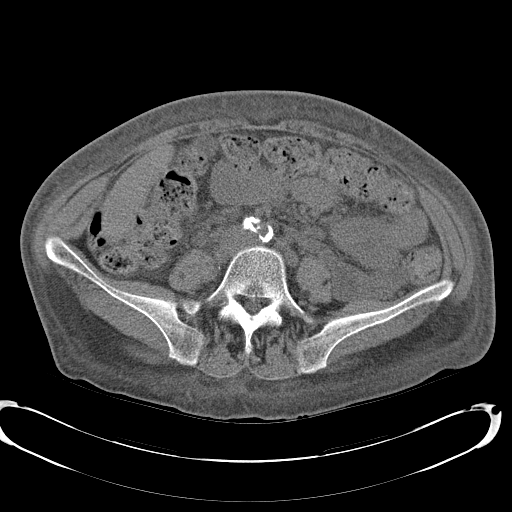
[im 50/83  soft-tissue]
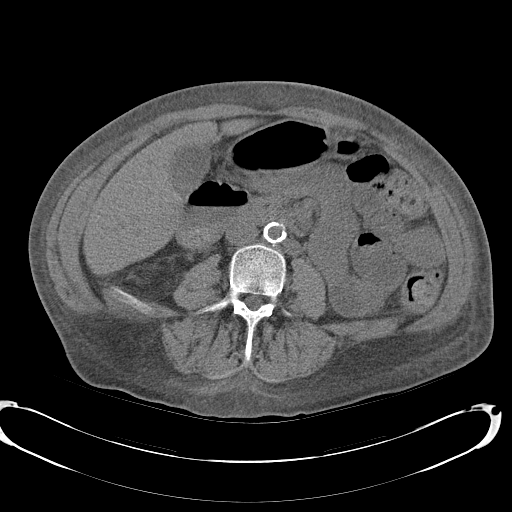
[im 50/83  bone]
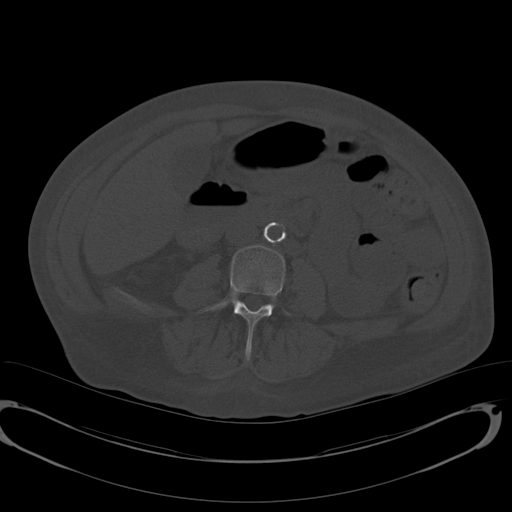
[im 55/83  soft-tissue]
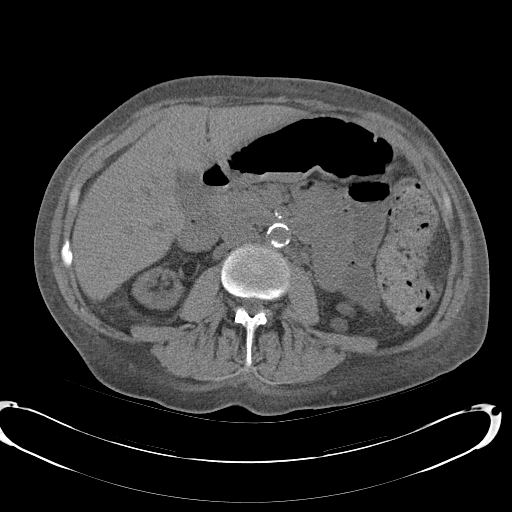
[im 61/83  soft-tissue]
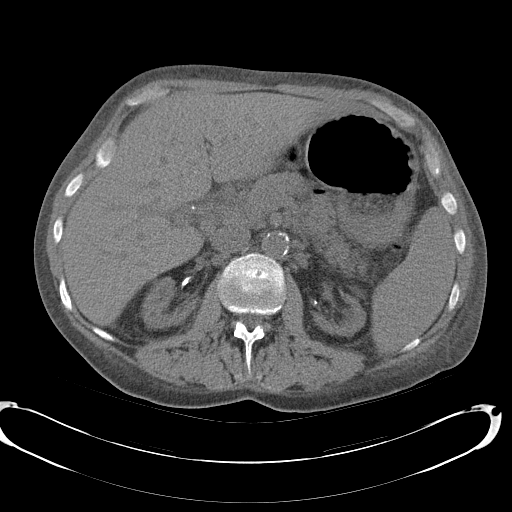
[im 66/83  soft-tissue]
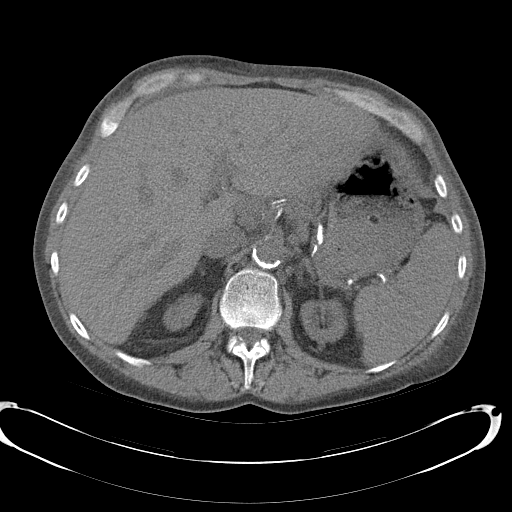
[im 72/83  soft-tissue]
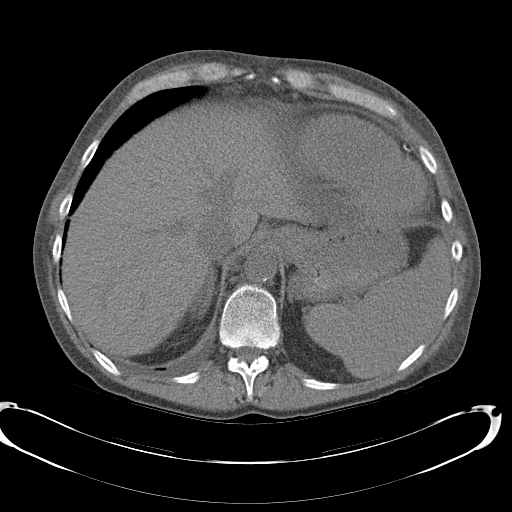
[im 77/83  soft-tissue]
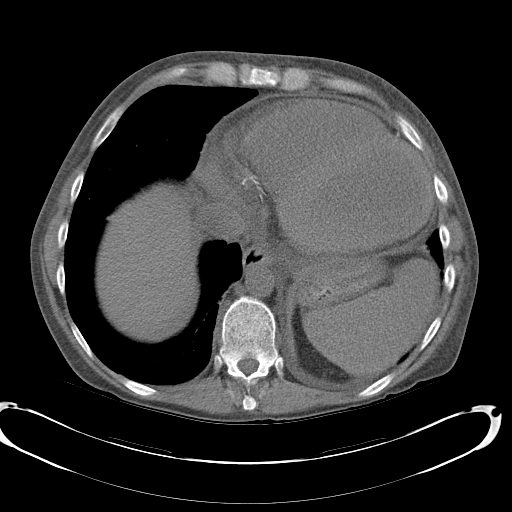

[Series 4: mpr cor (id) · coronal · 0.83mm/px · 3 of 102 slices shown]
[im 34/102  soft-tissue]
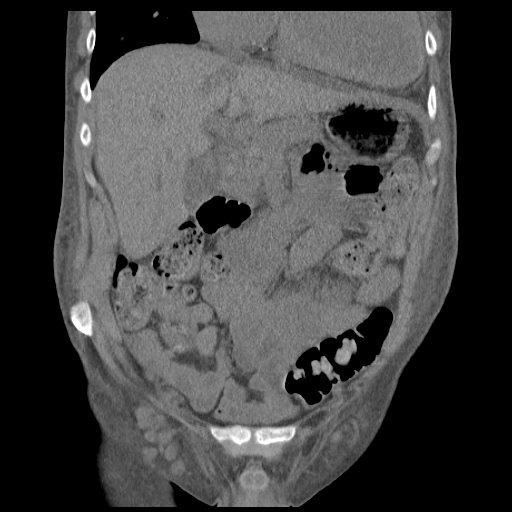
[im 45/102  soft-tissue]
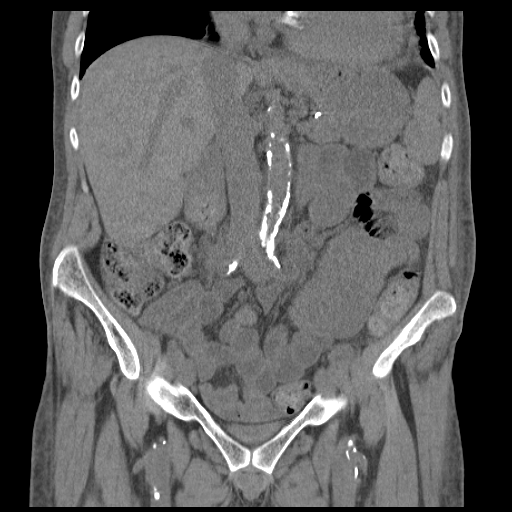
[im 57/102  soft-tissue]
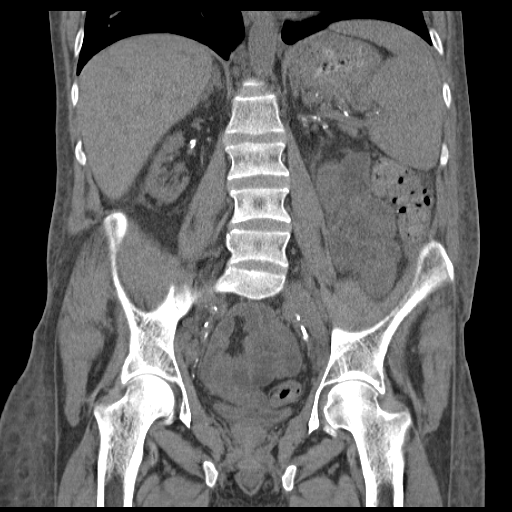

[17 of 46 positions shown; findings below may reference images not displayed]

FINDINGS: Cardiomegaly is again demonstrated.  There is either or
pericardial thickening or small amount of pericardial fluid.

The liver does not show any focal lesions without contrast.  No
calcified gallstones.  The spleen is within normal limits without
contrast.  No pancreatic lesion is seen.  The kidneys show profound
generalized atrophy bilaterally.  There is extensive vascular
calcification.  I do not see any calcifications that look like
stones.  There is no hydroureteronephrosis.  No stone is seen along
the course of either ureter.  No stone in the bladder.

There is extensive atherosclerotic disease of the aorta and its
branch vessels.  There is a large amount of fecal matter in the
colon.  I do not identify any acute bowel pathology.  Chronic
degenerative changes effect the spine. There are numerous bilateral
inguinal lymph nodes
IMPRESSION: Chronic renal atrophy.  Probable small renal cysts.  No evidence of
urinary tract stone disease.  No specific lesion seen to explain
hematuria.

Extensive atherosclerotic change

Cardiomegaly.  Pericardial thickening and/or small amount of
pericardial fluid.

## 2015-03-26 IMAGING — CR DG KNEE COMPLETE 4+V*L*
4 series · 4 of 4 positions shown · non-contrast
Comparison: January 21, 2013

CLINICAL DATA: Pain post trauma

EXAM:
LEFT KNEE - COMPLETE 4+ VIEW

[view not recorded (1 of 4)]
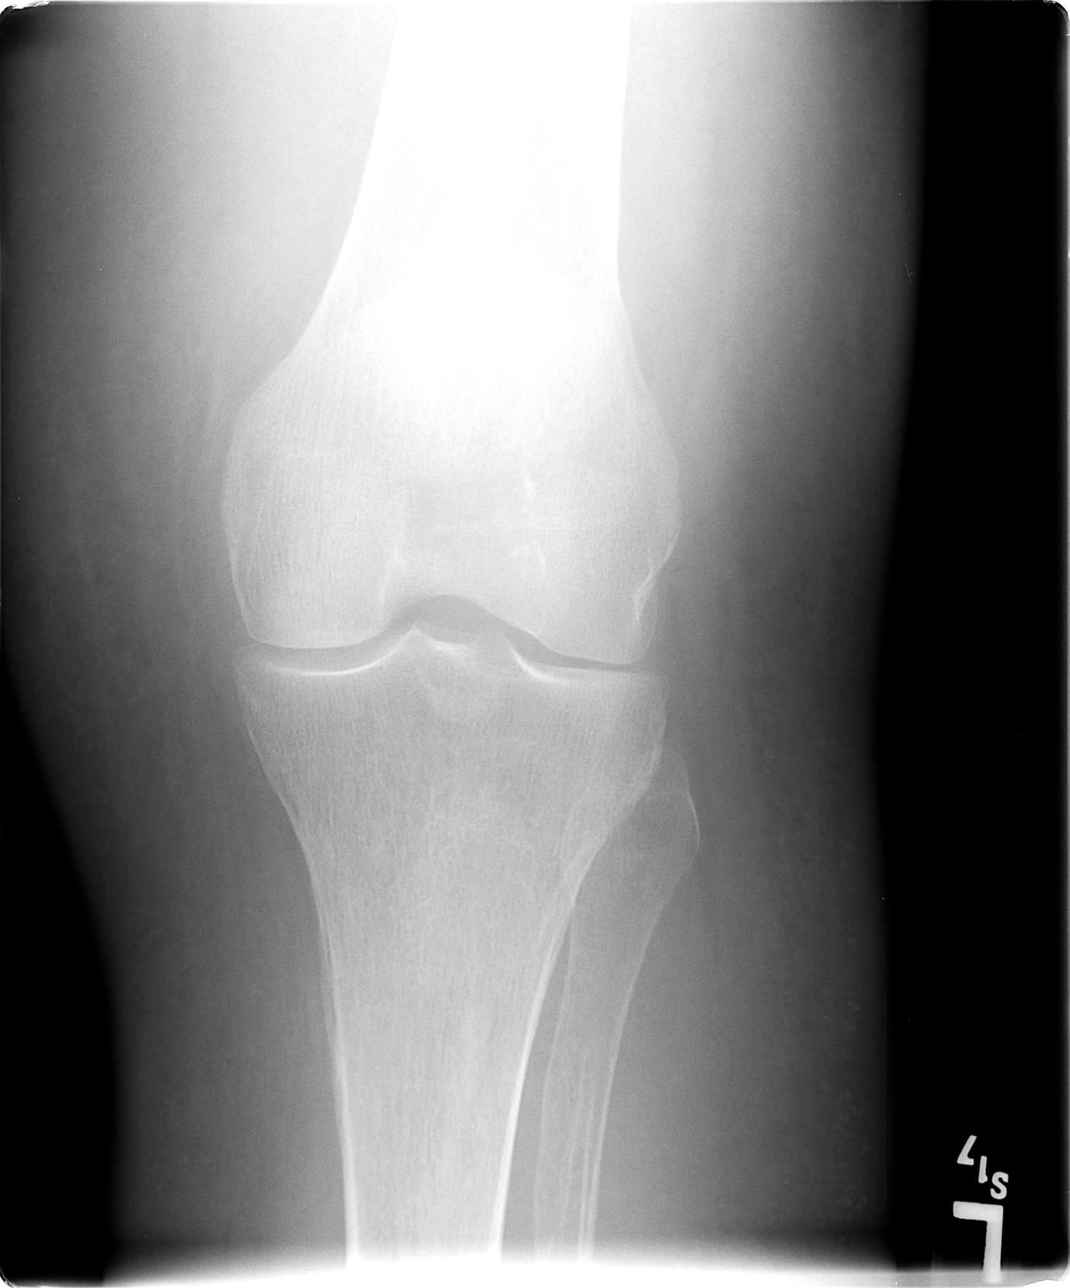

[view not recorded (2 of 4)]
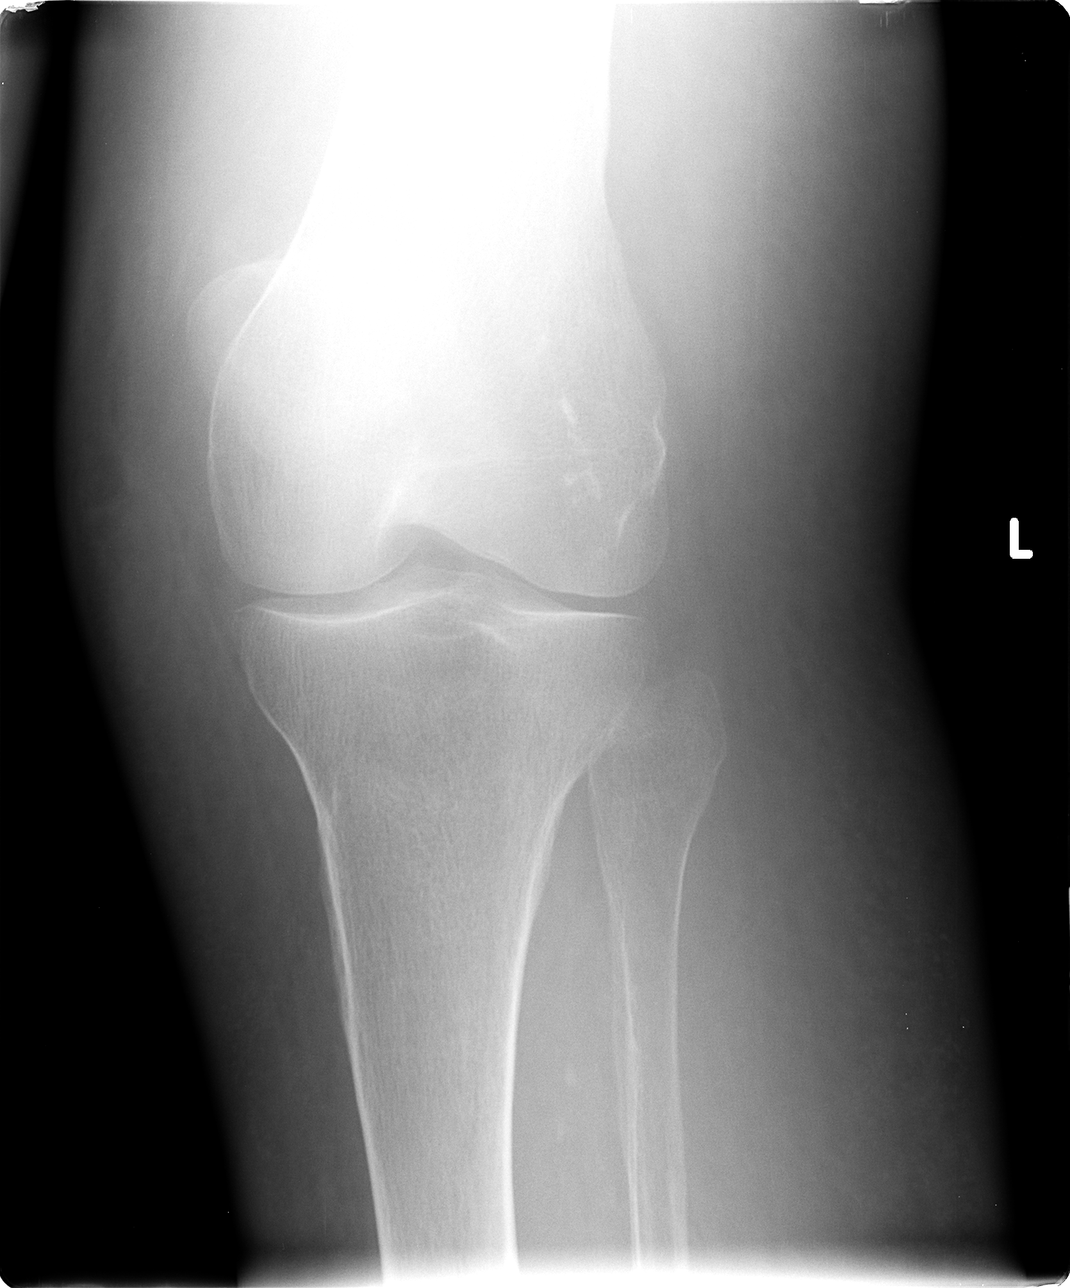

[view not recorded (3 of 4)]
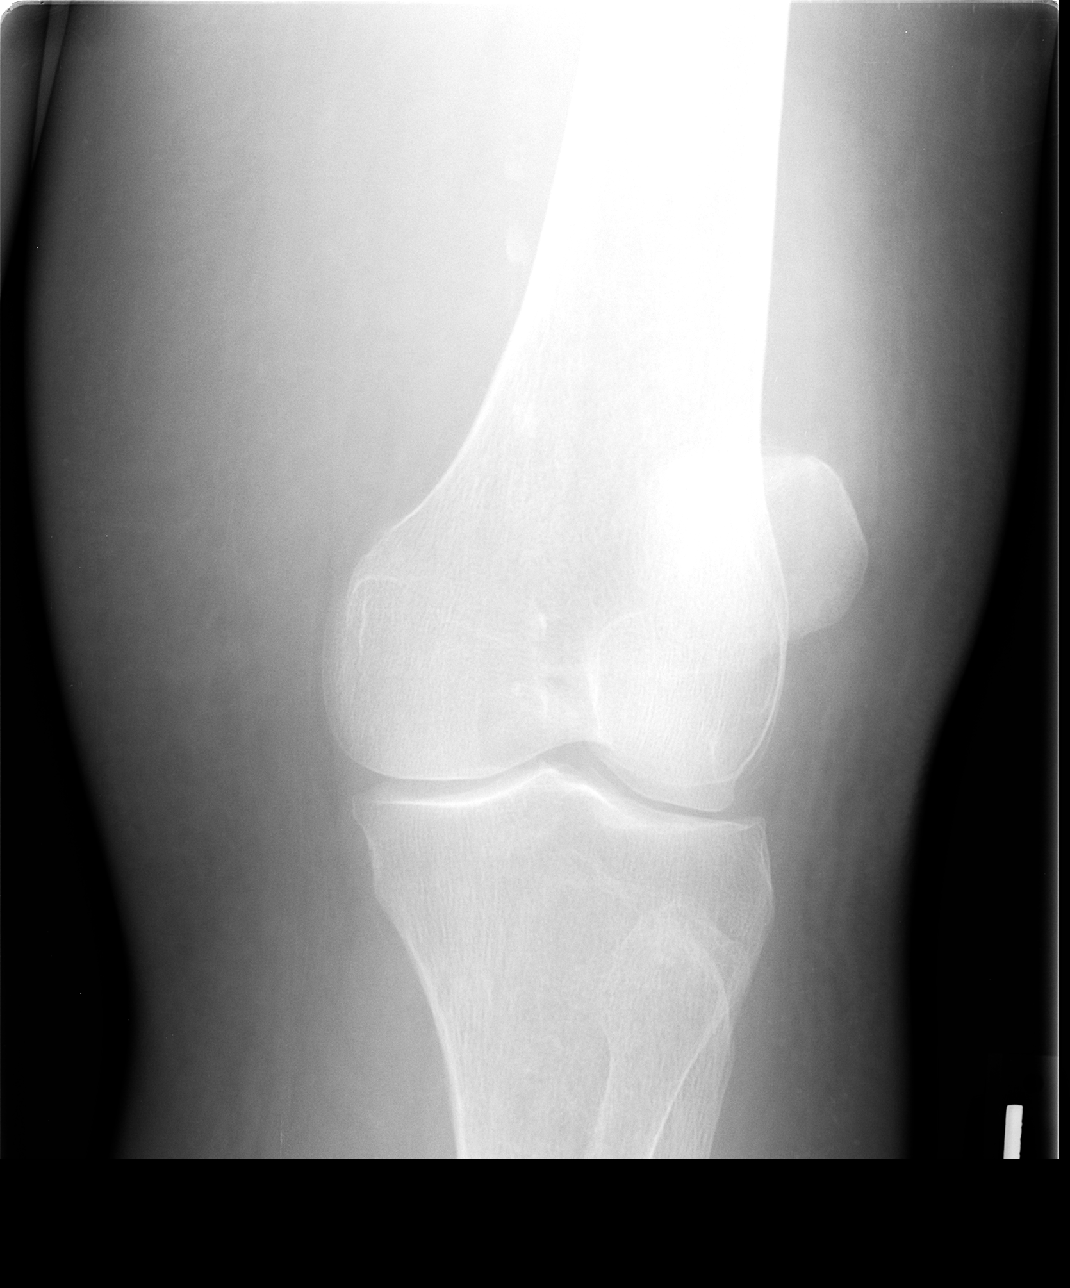

[view not recorded (4 of 4)]
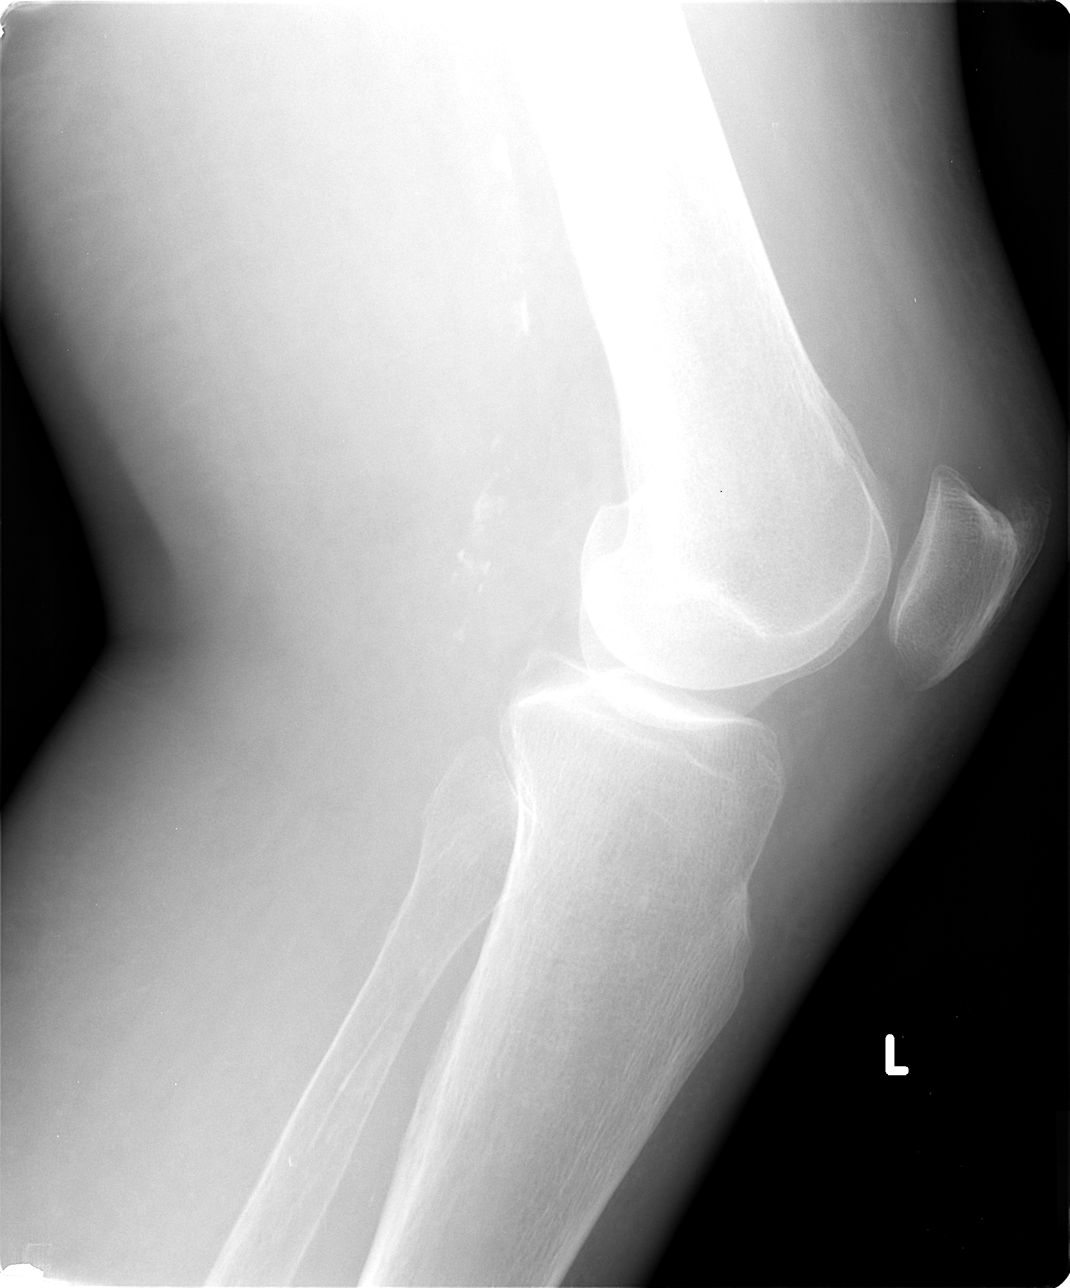

[4 of 4 positions shown; findings below may reference images not displayed]

IMPRESSION: Atherosclerotic change. Slight osteoarthritic change. No fracture or
effusion.

:
Frontal, lateral, and bilateral oblique views were obtained. There
is no fracture, dislocation, or effusion. There is slight narrowing
laterally. There is a spur arising from the anterior superior
patella. There are multiple foci of arterial vascular calcification.

## 2015-05-20 IMAGING — US US EXTREM LOW VENOUS*L*
1 series · 14 of 24 positions shown · non-contrast
Comparison: None.

CLINICAL DATA: Cellulitis

EXAM:
VENOUS DUPLEX ULTRASOUND OF LEFT LOWER EXTREMITY
TECHNIQUE: Gray-scale sonography with graded compression, as well as color
Doppler and duplex ultrasound, were performed to evaluate the deep
venous system from the level of the common femoral vein through the
popliteal and proximal calf veins. Spectral Doppler was utilized to
evaluate flow at rest and with distal augmentation maneuvers.

[Series 1: us extrem low venous*left* · 0.06mm/px · 14 of 37 slices shown]
[im 1/37]
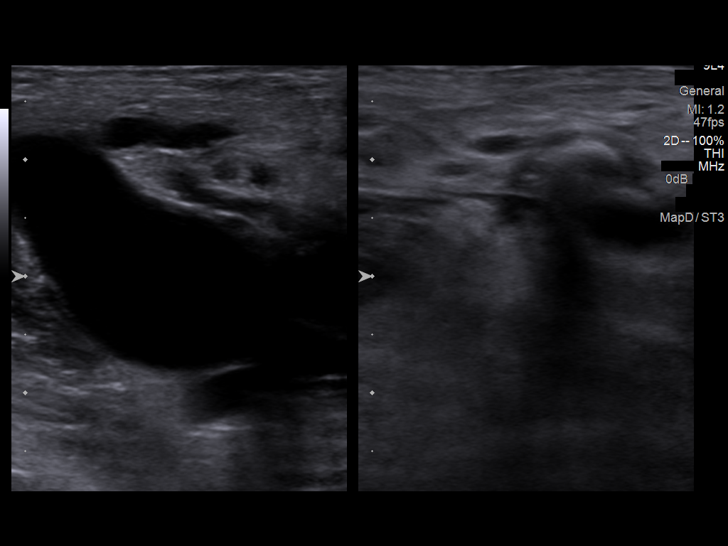
[im 4/37]
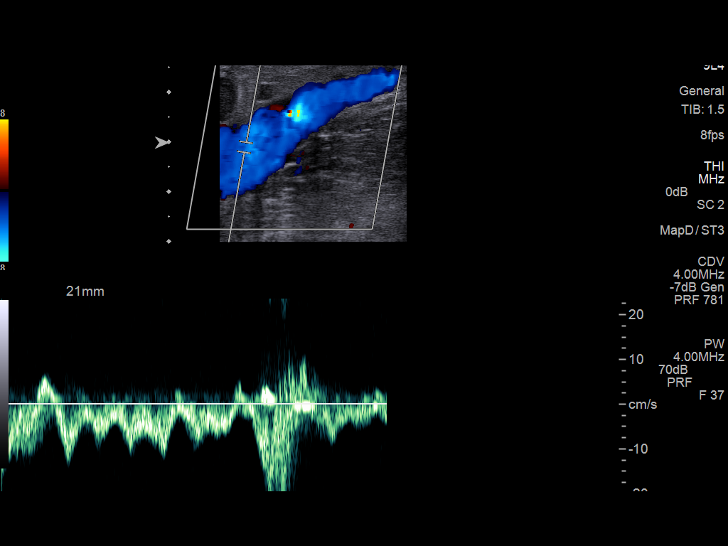
[im 7/37]
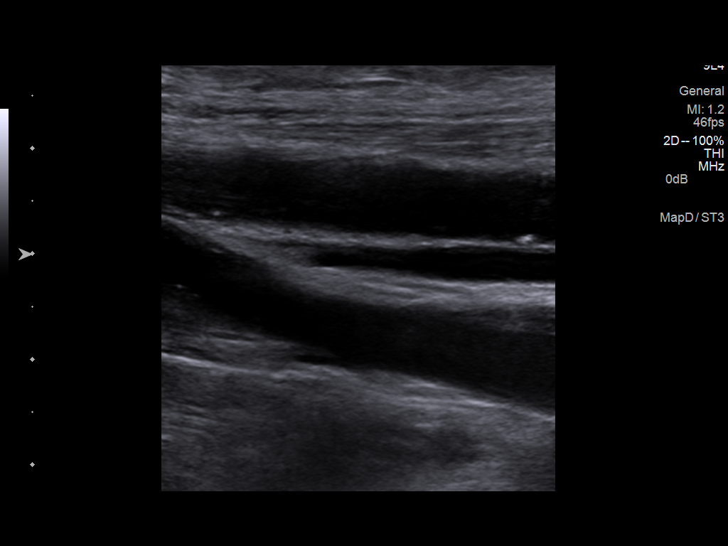
[im 10/37]
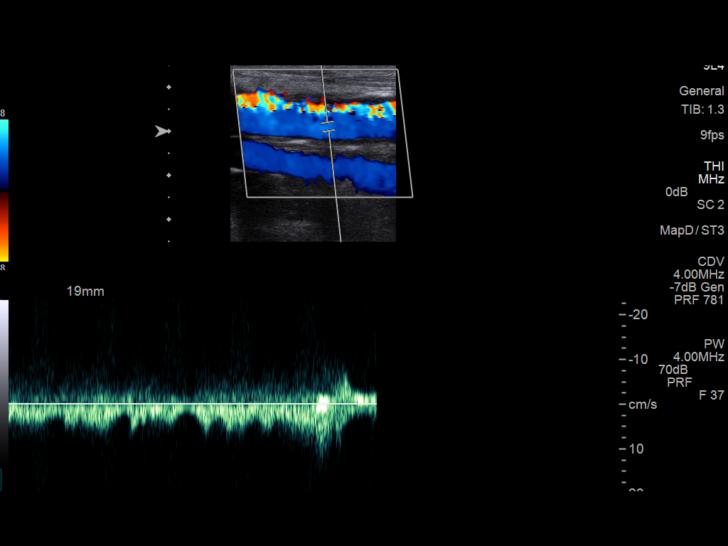
[im 11/37]
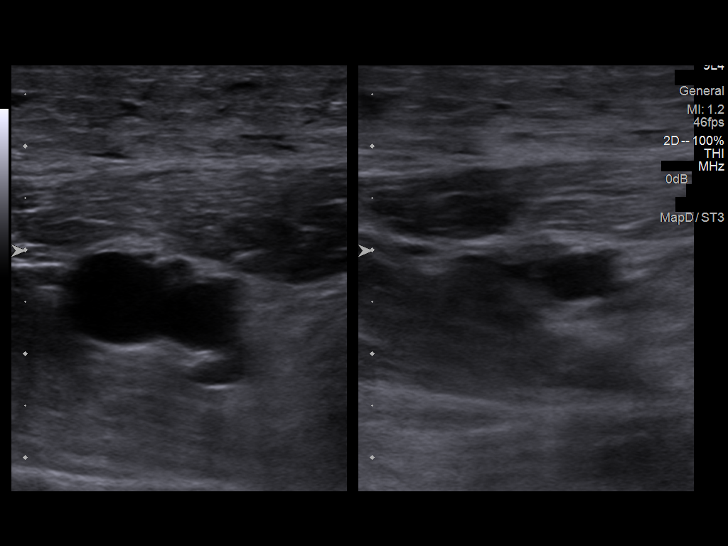
[im 15/37]
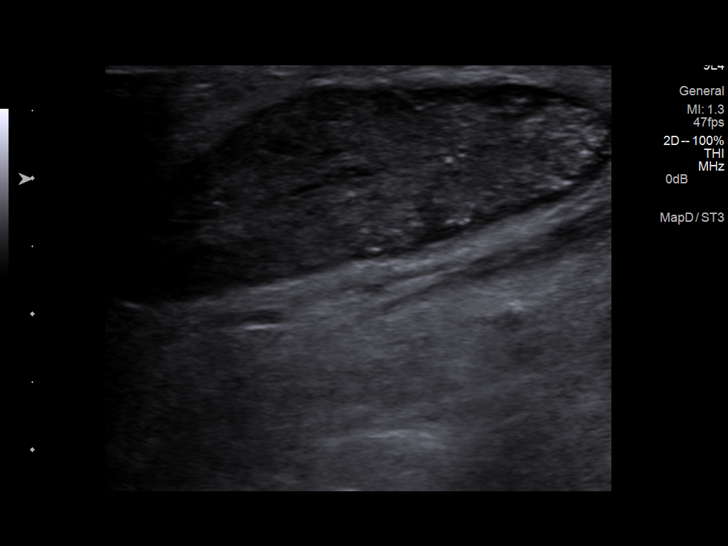
[im 18/37]
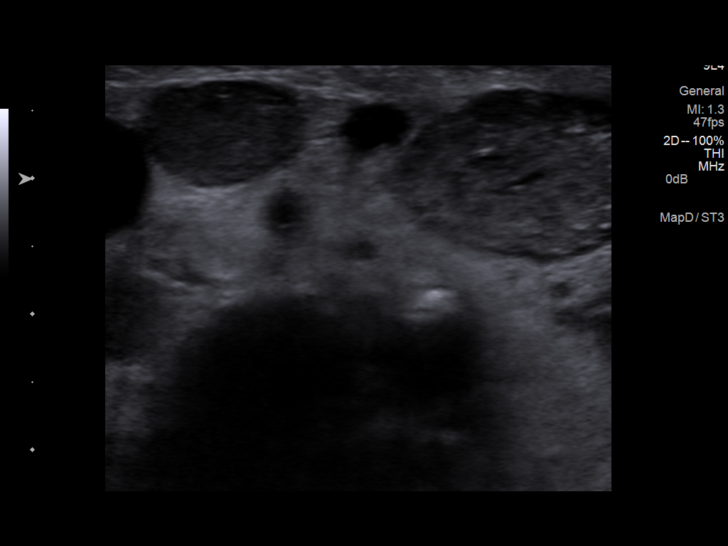
[im 19/37]
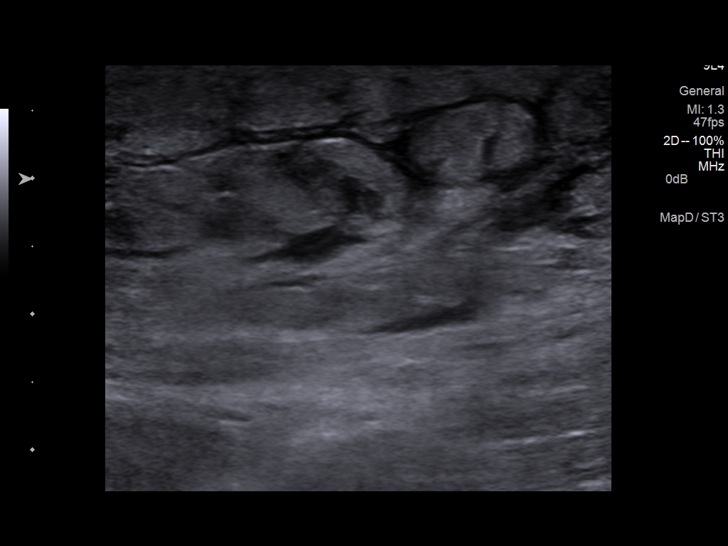
[im 22/37]
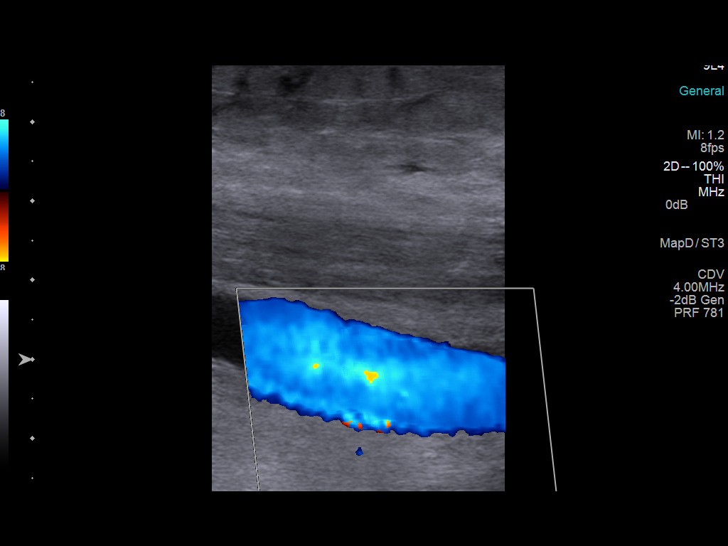
[im 26/37]
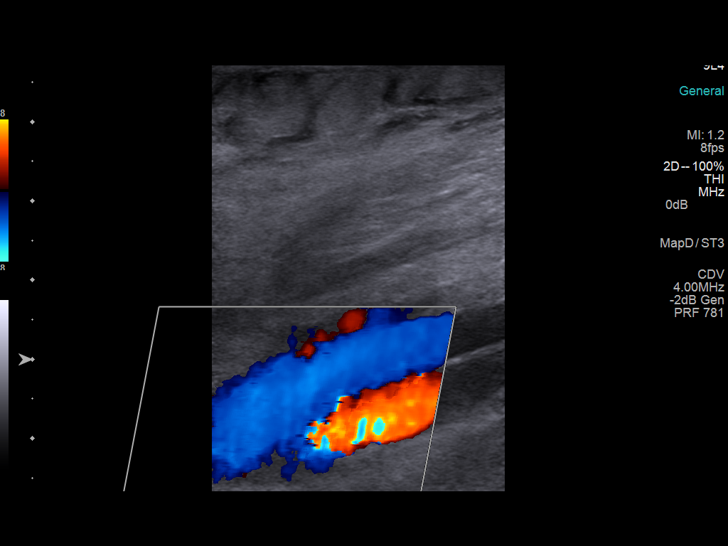
[im 29/37]
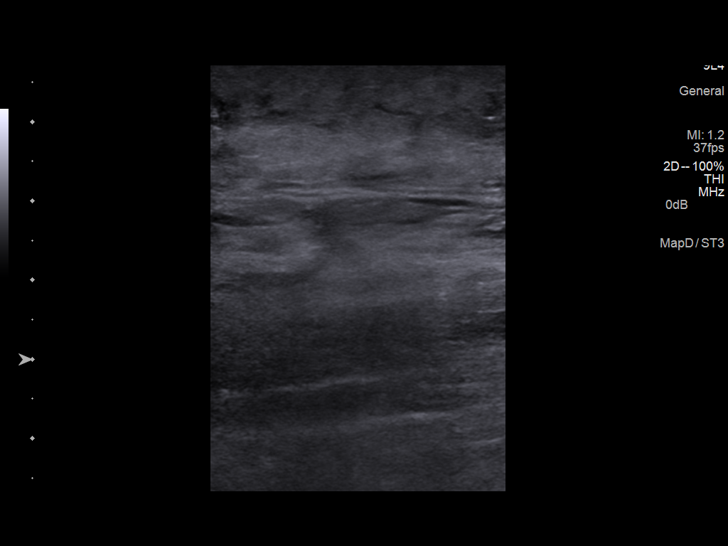
[im 30/37]
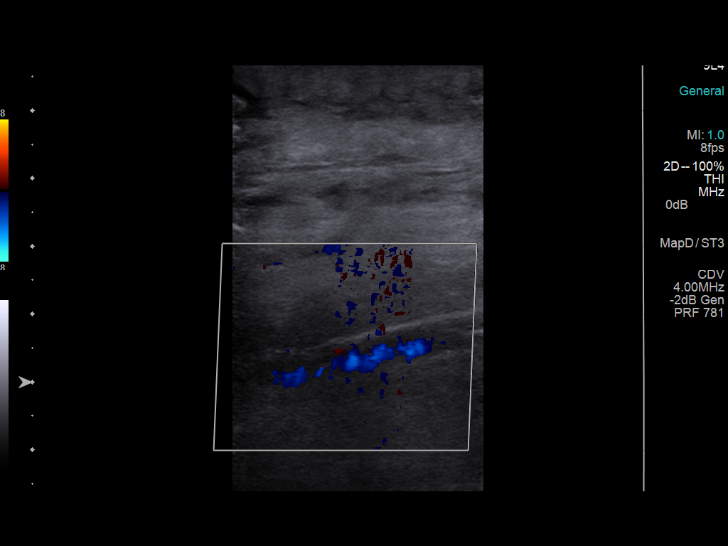
[im 33/37]
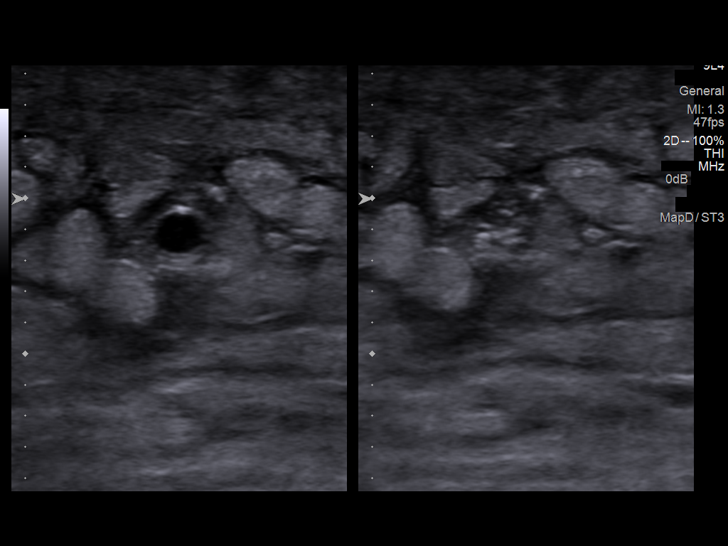
[im 37/37]
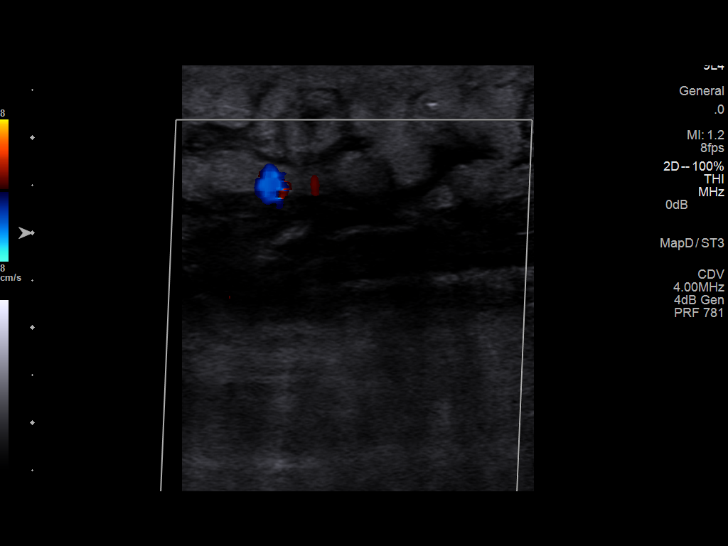

[14 of 24 positions shown; findings below may reference images not displayed]

FINDINGS: Flow in the venous structures of the left lower extremity is
spontaneous and phasic in all segments. There is normal compression
and augmentation in the venous structures of the left lower
extremity. Venous Doppler signal is normal in all regions. There is
no thrombus in the deep or visualized superficial venous structures
on the left. There is no deep venous incompetence on the left.

There is extensive soft tissue edema.

There is an enlarged left inguinal lymph node measuring 3.9 x 1.3 x
1.9 cm.
IMPRESSION: No evidence of left lower extremity deep venous thrombosis. There is
extensive left lower extremity soft tissue edema. There is enlarged
left inguinal lymph node which may be reactive in etiology.

## 2015-06-25 IMAGING — CR DG KNEE COMPLETE 4+V*R*
4 series · 4 of 4 positions shown · non-contrast
Comparison: 02/25/2012

CLINICAL DATA: Pain and swelling right knee, fell 1 day ago

EXAM:
RIGHT KNEE - COMPLETE 4+ VIEW

[view not recorded (1 of 4)]
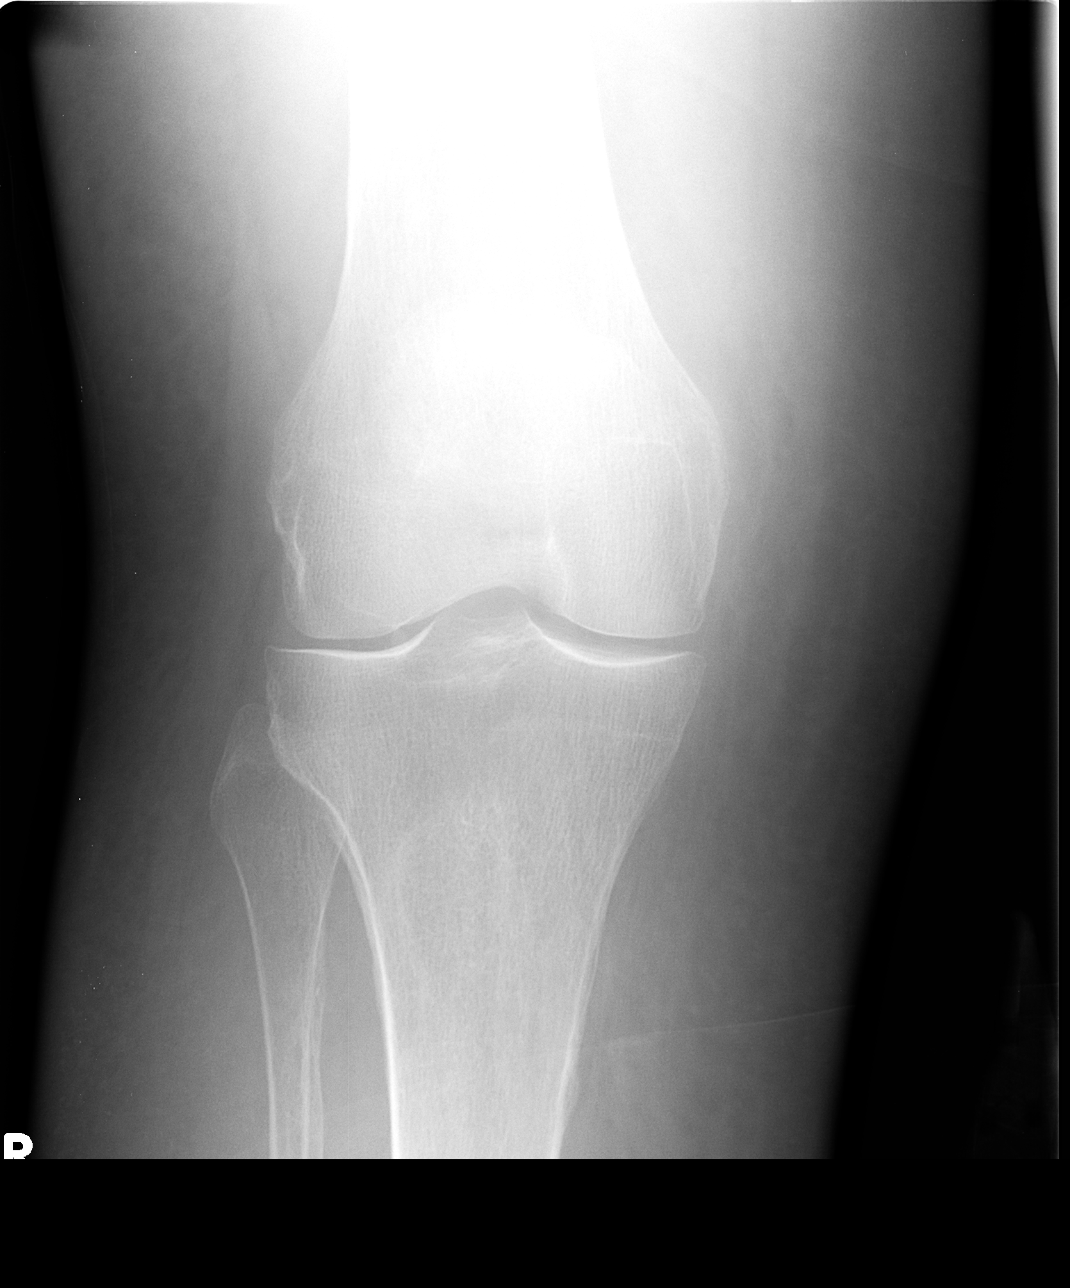

[view not recorded (2 of 4)]
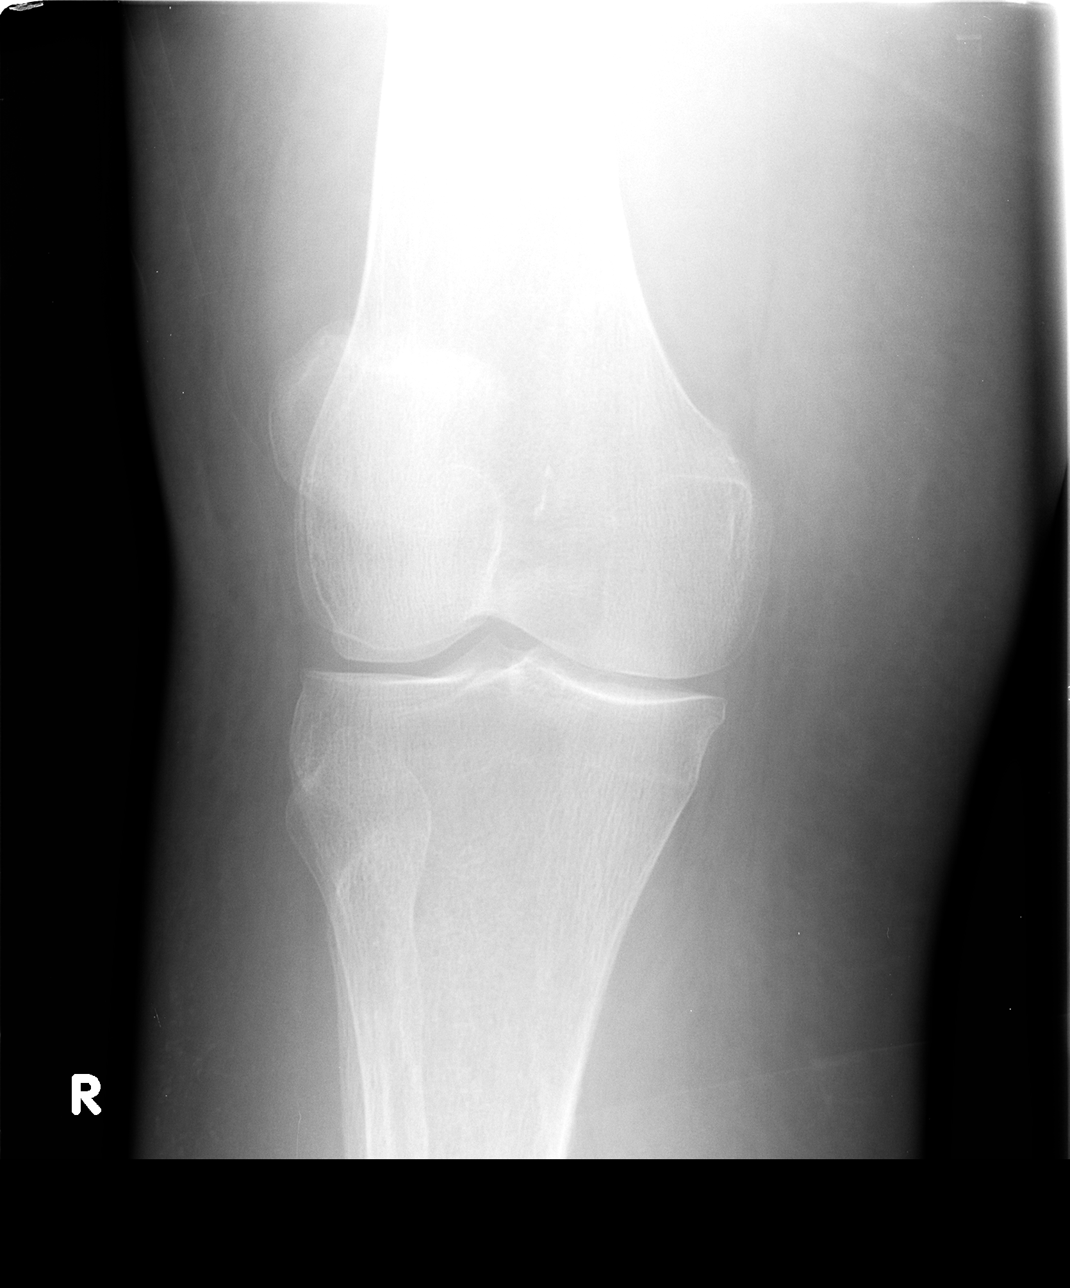

[view not recorded (3 of 4)]
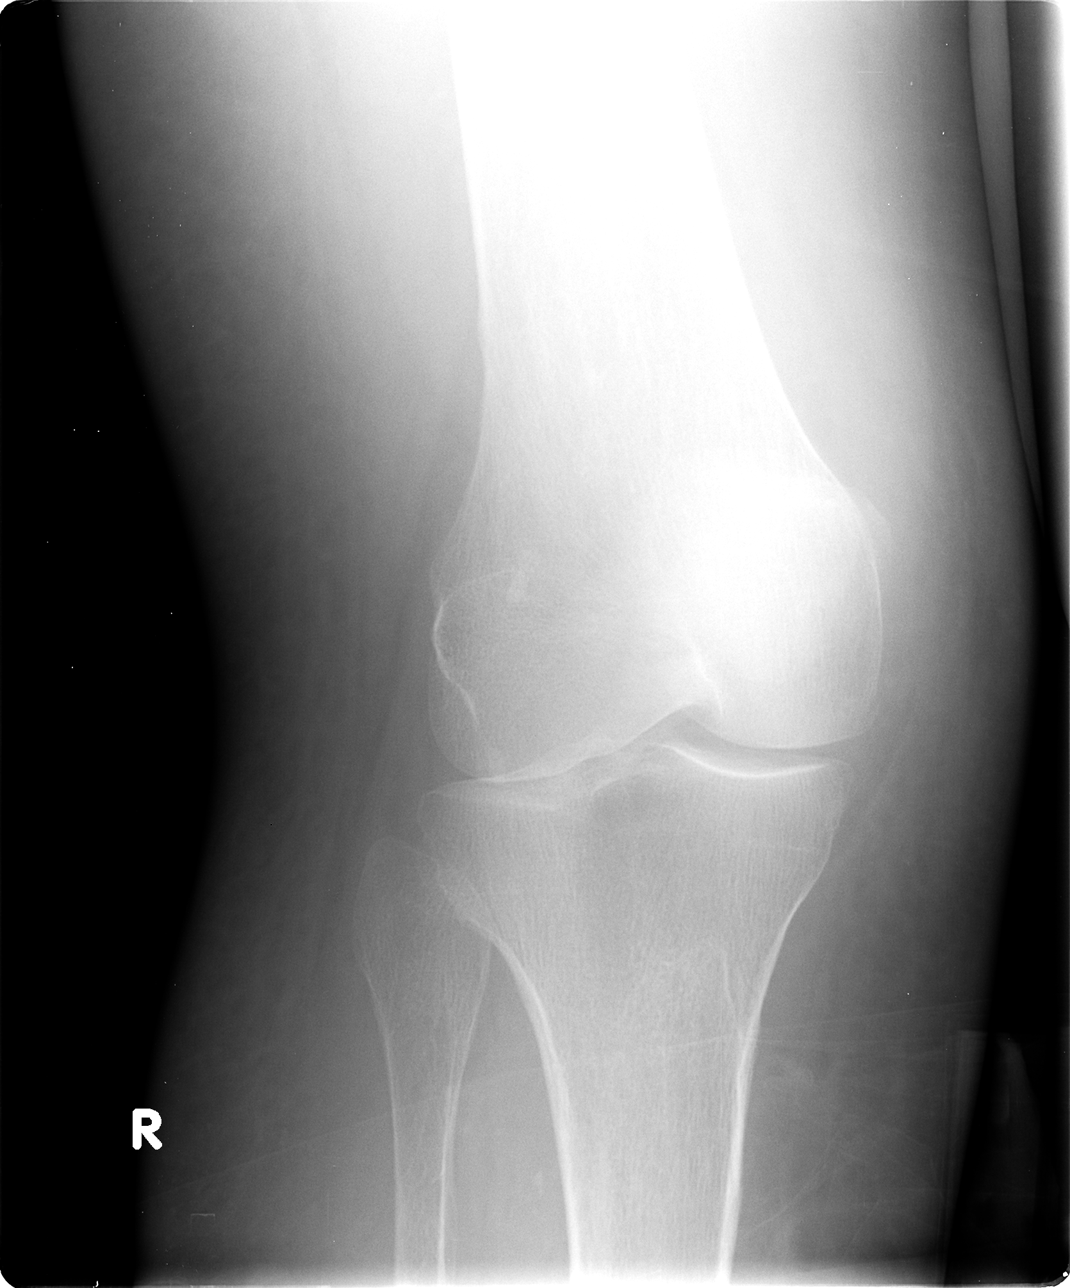

[view not recorded (4 of 4)]
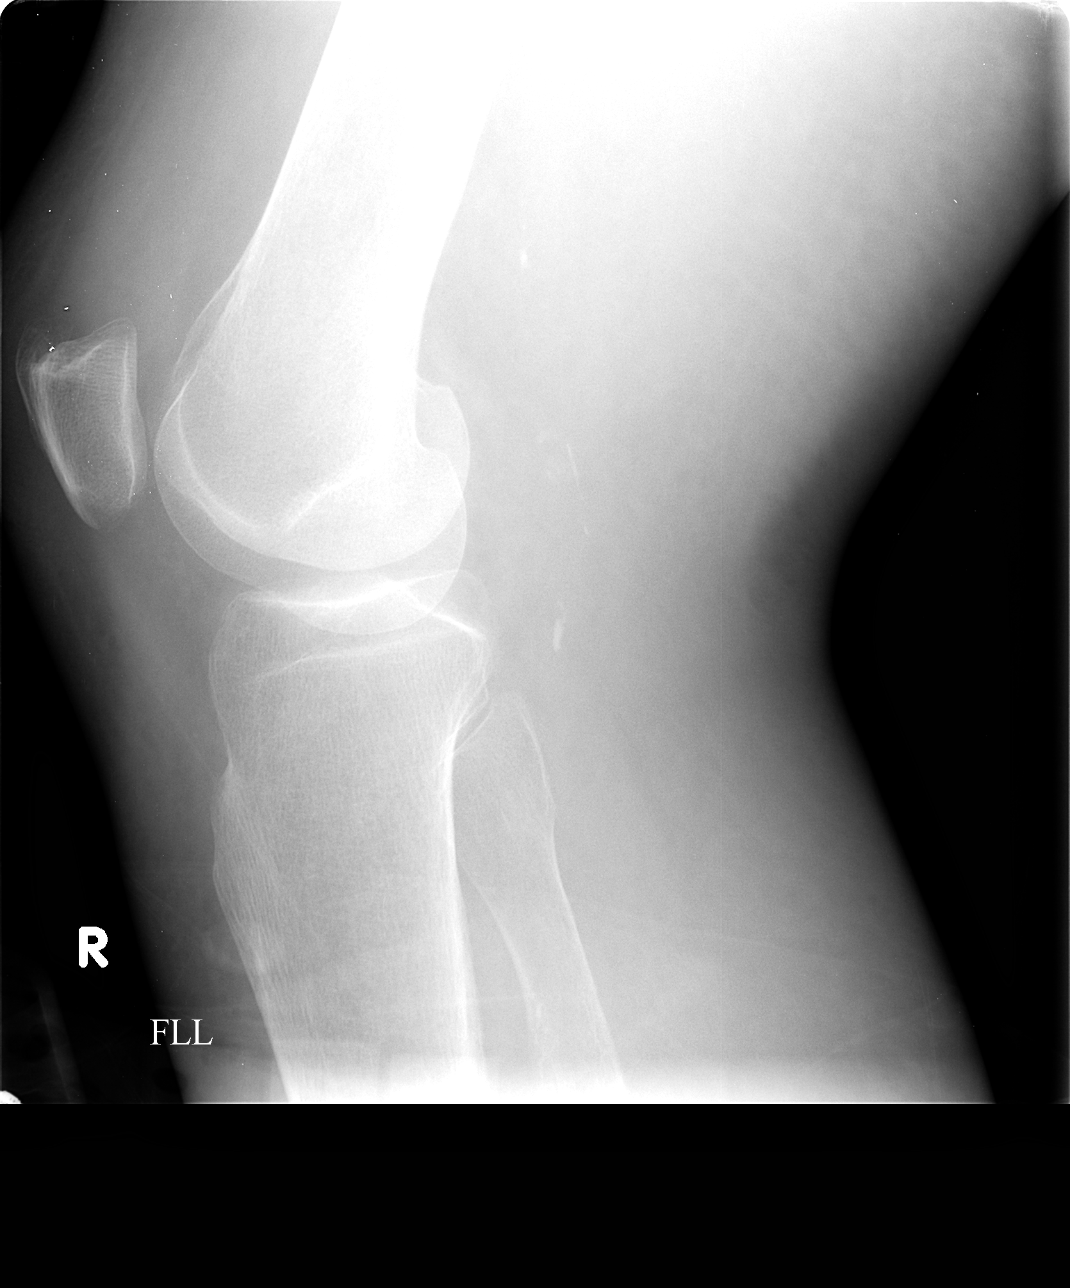

[4 of 4 positions shown; findings below may reference images not displayed]

FINDINGS: Osseous demineralization.

Joint space narrowing.

No acute fracture, dislocation, or bone destruction.

Small patellar spur at quadriceps tendon insertion.

No definite knee joint effusion.

Minimal atherosclerotic calcification.
IMPRESSION: Osseous demineralization with degenerative changes right knee.

No definite acute osseous findings.
